# Patient Record
Sex: Male | Born: 1950
Health system: Southern US, Community
[De-identification: ages and names within clinical notes are randomized; demographics above are authoritative.]

## PROBLEM LIST (undated history)

## (undated) DIAGNOSIS — E785 Hyperlipidemia, unspecified: Secondary | ICD-10-CM

## (undated) DIAGNOSIS — R51 Headache: Secondary | ICD-10-CM

## (undated) DIAGNOSIS — I5032 Chronic diastolic (congestive) heart failure: Secondary | ICD-10-CM

## (undated) DIAGNOSIS — I739 Peripheral vascular disease, unspecified: Secondary | ICD-10-CM

## (undated) DIAGNOSIS — R0600 Dyspnea, unspecified: Secondary | ICD-10-CM

## (undated) DIAGNOSIS — J449 Chronic obstructive pulmonary disease, unspecified: Secondary | ICD-10-CM

## (undated) DIAGNOSIS — C413 Malignant neoplasm of ribs, sternum and clavicle: Secondary | ICD-10-CM

## (undated) DIAGNOSIS — Y92009 Unspecified place in unspecified non-institutional (private) residence as the place of occurrence of the external cause: Secondary | ICD-10-CM

## (undated) DIAGNOSIS — Z9289 Personal history of other medical treatment: Secondary | ICD-10-CM

## (undated) DIAGNOSIS — I509 Heart failure, unspecified: Secondary | ICD-10-CM

## (undated) DIAGNOSIS — W19XXXA Unspecified fall, initial encounter: Secondary | ICD-10-CM

## (undated) DIAGNOSIS — K219 Gastro-esophageal reflux disease without esophagitis: Secondary | ICD-10-CM

## (undated) DIAGNOSIS — I493 Ventricular premature depolarization: Secondary | ICD-10-CM

## (undated) DIAGNOSIS — F329 Major depressive disorder, single episode, unspecified: Secondary | ICD-10-CM

## (undated) DIAGNOSIS — K5903 Drug induced constipation: Secondary | ICD-10-CM

## (undated) DIAGNOSIS — Z8719 Personal history of other diseases of the digestive system: Secondary | ICD-10-CM

## (undated) DIAGNOSIS — K5900 Constipation, unspecified: Secondary | ICD-10-CM

## (undated) DIAGNOSIS — E669 Obesity, unspecified: Secondary | ICD-10-CM

## (undated) DIAGNOSIS — I2510 Atherosclerotic heart disease of native coronary artery without angina pectoris: Secondary | ICD-10-CM

## (undated) DIAGNOSIS — G8929 Other chronic pain: Secondary | ICD-10-CM

## (undated) DIAGNOSIS — M545 Low back pain, unspecified: Secondary | ICD-10-CM

## (undated) DIAGNOSIS — Z720 Tobacco use: Secondary | ICD-10-CM

## (undated) DIAGNOSIS — I219 Acute myocardial infarction, unspecified: Secondary | ICD-10-CM

## (undated) DIAGNOSIS — R519 Headache, unspecified: Secondary | ICD-10-CM

## (undated) DIAGNOSIS — F419 Anxiety disorder, unspecified: Secondary | ICD-10-CM

## (undated) DIAGNOSIS — D649 Anemia, unspecified: Secondary | ICD-10-CM

## (undated) DIAGNOSIS — G894 Chronic pain syndrome: Secondary | ICD-10-CM

## (undated) DIAGNOSIS — I10 Essential (primary) hypertension: Secondary | ICD-10-CM

## (undated) DIAGNOSIS — F32A Depression, unspecified: Secondary | ICD-10-CM

## (undated) DIAGNOSIS — I779 Disorder of arteries and arterioles, unspecified: Secondary | ICD-10-CM

## (undated) DIAGNOSIS — G709 Myoneural disorder, unspecified: Secondary | ICD-10-CM

## (undated) DIAGNOSIS — E611 Iron deficiency: Secondary | ICD-10-CM

## (undated) DIAGNOSIS — I4729 Other ventricular tachycardia: Secondary | ICD-10-CM

## (undated) DIAGNOSIS — I472 Ventricular tachycardia: Secondary | ICD-10-CM

## (undated) DIAGNOSIS — M1711 Unilateral primary osteoarthritis, right knee: Secondary | ICD-10-CM

## (undated) DIAGNOSIS — R911 Solitary pulmonary nodule: Secondary | ICD-10-CM

## (undated) DIAGNOSIS — R7303 Prediabetes: Secondary | ICD-10-CM

## (undated) DIAGNOSIS — J189 Pneumonia, unspecified organism: Secondary | ICD-10-CM

## (undated) HISTORY — DX: Hyperlipidemia, unspecified: E78.5

## (undated) HISTORY — PX: TONSILLECTOMY: SUR1361

## (undated) HISTORY — PX: HERNIA REPAIR: SHX51

## (undated) HISTORY — DX: Essential (primary) hypertension: I10

## (undated) HISTORY — DX: Unilateral primary osteoarthritis, right knee: M17.11

## (undated) HISTORY — DX: Tobacco use: Z72.0

## (undated) HISTORY — PX: BACK SURGERY: SHX140

## (undated) HISTORY — DX: Obesity, unspecified: E66.9

## (undated) HISTORY — PX: KNEE ARTHROPLASTY: SHX992

## (undated) HISTORY — PX: NASAL SINUS SURGERY: SHX719

## (undated) HISTORY — PX: PAIN PUMP IMPLANTATION: SHX330

## (undated) HISTORY — PX: FRACTURE SURGERY: SHX138

## (undated) HISTORY — DX: Malignant neoplasm of ribs, sternum and clavicle: C41.3

## (undated) HISTORY — DX: Atherosclerotic heart disease of native coronary artery without angina pectoris: I25.10

## (undated) HISTORY — PX: HIATAL HERNIA REPAIR: SHX195

## (undated) HISTORY — PX: APPENDECTOMY: SHX54

## (undated) HISTORY — PX: SHOULDER SURGERY: SHX246

## (undated) HISTORY — PX: REPLACEMENT TOTAL KNEE: SUR1224

## (undated) HISTORY — DX: Chronic pain syndrome: G89.4

## (undated) HISTORY — PX: JOINT REPLACEMENT: SHX530

## (undated) HISTORY — PX: COLONOSCOPY: SHX174

---

## 1985-08-06 HISTORY — PX: CHOLECYSTECTOMY OPEN: SUR202

## 1994-08-06 HISTORY — PX: CORONARY ANGIOPLASTY WITH STENT PLACEMENT: SHX49

## 1996-08-06 DIAGNOSIS — Z9289 Personal history of other medical treatment: Secondary | ICD-10-CM

## 1996-08-06 HISTORY — DX: Personal history of other medical treatment: Z92.89

## 1998-06-05 ENCOUNTER — Encounter: Payer: Self-pay | Admitting: Emergency Medicine

## 1998-06-05 ENCOUNTER — Emergency Department (HOSPITAL_COMMUNITY): Admission: EM | Admit: 1998-06-05 | Discharge: 1998-06-05 | Payer: Self-pay | Admitting: Emergency Medicine

## 1999-06-09 ENCOUNTER — Encounter: Admission: RE | Admit: 1999-06-09 | Discharge: 1999-06-09 | Payer: Self-pay | Admitting: Family Medicine

## 1999-06-09 ENCOUNTER — Encounter: Payer: Self-pay | Admitting: Family Medicine

## 1999-06-16 ENCOUNTER — Encounter: Payer: Self-pay | Admitting: Family Medicine

## 1999-06-16 ENCOUNTER — Encounter: Admission: RE | Admit: 1999-06-16 | Discharge: 1999-06-16 | Payer: Self-pay | Admitting: Family Medicine

## 2000-07-03 ENCOUNTER — Encounter: Admission: RE | Admit: 2000-07-03 | Discharge: 2000-07-03 | Payer: Self-pay | Admitting: Orthopedic Surgery

## 2000-07-03 ENCOUNTER — Encounter: Payer: Self-pay | Admitting: Orthopedic Surgery

## 2000-08-26 ENCOUNTER — Encounter: Payer: Self-pay | Admitting: Orthopedic Surgery

## 2000-09-02 ENCOUNTER — Observation Stay (HOSPITAL_COMMUNITY): Admission: RE | Admit: 2000-09-02 | Discharge: 2000-09-03 | Payer: Self-pay | Admitting: Orthopedic Surgery

## 2000-09-18 ENCOUNTER — Encounter: Admission: RE | Admit: 2000-09-18 | Discharge: 2000-12-17 | Payer: Self-pay | Admitting: Orthopedic Surgery

## 2000-12-18 ENCOUNTER — Encounter: Admission: RE | Admit: 2000-12-18 | Discharge: 2001-01-31 | Payer: Self-pay | Admitting: Orthopedic Surgery

## 2003-07-22 ENCOUNTER — Encounter: Admission: RE | Admit: 2003-07-22 | Discharge: 2003-07-22 | Payer: Self-pay | Admitting: Family Medicine

## 2003-07-27 ENCOUNTER — Ambulatory Visit (HOSPITAL_COMMUNITY): Admission: RE | Admit: 2003-07-27 | Discharge: 2003-07-27 | Payer: Self-pay

## 2003-11-04 ENCOUNTER — Encounter: Admission: RE | Admit: 2003-11-04 | Discharge: 2003-11-04 | Payer: Self-pay | Admitting: Family Medicine

## 2009-05-30 ENCOUNTER — Emergency Department (HOSPITAL_COMMUNITY): Admission: EM | Admit: 2009-05-30 | Discharge: 2009-05-30 | Payer: Self-pay | Admitting: Emergency Medicine

## 2009-09-01 ENCOUNTER — Observation Stay (HOSPITAL_COMMUNITY): Admission: EM | Admit: 2009-09-01 | Discharge: 2009-09-02 | Payer: Self-pay | Admitting: Emergency Medicine

## 2009-09-01 ENCOUNTER — Ambulatory Visit: Payer: Self-pay | Admitting: Internal Medicine

## 2009-09-01 HISTORY — PX: CORONARY ANGIOPLASTY WITH STENT PLACEMENT: SHX49

## 2010-03-06 DIAGNOSIS — M1711 Unilateral primary osteoarthritis, right knee: Secondary | ICD-10-CM

## 2010-03-06 HISTORY — DX: Unilateral primary osteoarthritis, right knee: M17.11

## 2010-03-21 ENCOUNTER — Telehealth (INDEPENDENT_AMBULATORY_CARE_PROVIDER_SITE_OTHER): Payer: Self-pay | Admitting: *Deleted

## 2010-03-22 ENCOUNTER — Encounter (HOSPITAL_COMMUNITY): Admission: RE | Admit: 2010-03-22 | Discharge: 2010-04-24 | Payer: Self-pay | Admitting: Cardiology

## 2010-03-22 ENCOUNTER — Ambulatory Visit: Payer: Self-pay | Admitting: Cardiovascular Disease

## 2010-03-22 ENCOUNTER — Encounter: Payer: Self-pay | Admitting: Cardiovascular Disease

## 2010-03-22 ENCOUNTER — Ambulatory Visit: Payer: Self-pay

## 2010-04-05 ENCOUNTER — Inpatient Hospital Stay (HOSPITAL_COMMUNITY): Admission: RE | Admit: 2010-04-05 | Discharge: 2010-04-12 | Payer: Self-pay | Admitting: Orthopedic Surgery

## 2010-09-05 NOTE — Progress Notes (Signed)
Summary: nuc pre procedure  Phone Note Outgoing Call Call back at Home Phone [phone]   Call placed by: CHasspacher,RN Call placed to: Patient Action Taken: Phone Call Completed Reason for Call: Confirm/change Appt Summary of Call: Reviewed information on Myoview Information Sheet (see scanned document for further details).  Spoke with pt.      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

## 2010-09-05 NOTE — Assessment & Plan Note (Signed)
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 had trouble tolerating being on the scanning table due to pain. The stress portion of the study was dc'd after Dr. Patty Sermons was consulted.  Nuclear Med Study 1 or 2 day study:  1 day     Stress Test Type:  Rest Only Reading MD:  Charlton Haws, MD     Referring MD:  Cassell Clement Resting Radionuclide:  Technetium 46m Tetrofosmin     Resting Radionuclide Dose:  11 mCi    Stress Protocol          Nuclear Technologist:  Doyne Keel  Rest Procedure  Myocardial perfusion imaging was performed at rest 45 minutes following the intravenous administration of Myoview Technetium 42m Tetrofosmin.  QPS Raw Data Images:  motion artifact Rest Images:  Normal homogeneous uptake in all areas of the myocardium.   Quantitative Gated Spect Images QGS cine images:  non-gated study  Findings Non diagnostic nuclear study  Evidence for inferior infarct     Overall Impression  Exercise Capacity: Rest Only Overall Impression Comments: Rest only study shows decrease uptake in the inferior wall.  Particularly at the inferobase.  No stress images or gating done.

## 2010-10-19 LAB — CBC
HCT: 24.9 % — ABNORMAL LOW (ref 39.0–52.0)
HCT: 26.8 % — ABNORMAL LOW (ref 39.0–52.0)
HCT: 28.6 % — ABNORMAL LOW (ref 39.0–52.0)
HCT: 32.3 % — ABNORMAL LOW (ref 39.0–52.0)
Hemoglobin: 10 g/dL — ABNORMAL LOW (ref 13.0–17.0)
Hemoglobin: 8.3 g/dL — ABNORMAL LOW (ref 13.0–17.0)
Hemoglobin: 9 g/dL — ABNORMAL LOW (ref 13.0–17.0)
MCH: 28.6 pg (ref 26.0–34.0)
MCH: 29 pg (ref 26.0–34.0)
MCH: 29 pg (ref 26.0–34.0)
MCHC: 31 g/dL (ref 30.0–36.0)
MCHC: 31.3 g/dL (ref 30.0–36.0)
MCHC: 31.5 g/dL (ref 30.0–36.0)
MCV: 92.2 fL (ref 78.0–100.0)
MCV: 92.3 fL (ref 78.0–100.0)
MCV: 92.4 fL (ref 78.0–100.0)
MCV: 92.8 fL (ref 78.0–100.0)
RBC: 2.7 MIL/uL — ABNORMAL LOW (ref 4.22–5.81)
RBC: 2.9 MIL/uL — ABNORMAL LOW (ref 4.22–5.81)
RBC: 3.1 MIL/uL — ABNORMAL LOW (ref 4.22–5.81)
RBC: 3.48 MIL/uL — ABNORMAL LOW (ref 4.22–5.81)
RDW: 13.3 % (ref 11.5–15.5)
WBC: 5.9 10*3/uL (ref 4.0–10.5)

## 2010-10-19 LAB — PROTIME-INR
INR: 2.26 — ABNORMAL HIGH (ref 0.00–1.49)
INR: 2.73 — ABNORMAL HIGH (ref 0.00–1.49)
INR: 3.28 — ABNORMAL HIGH (ref 0.00–1.49)
INR: 3.29 — ABNORMAL HIGH (ref 0.00–1.49)
Prothrombin Time: 14.4 seconds (ref 11.6–15.2)
Prothrombin Time: 29 seconds — ABNORMAL HIGH (ref 11.6–15.2)
Prothrombin Time: 30.5 seconds — ABNORMAL HIGH (ref 11.6–15.2)
Prothrombin Time: 44.7 seconds — ABNORMAL HIGH (ref 11.6–15.2)

## 2010-10-19 LAB — BASIC METABOLIC PANEL
CO2: 34 mEq/L — ABNORMAL HIGH (ref 19–32)
Calcium: 7.6 mg/dL — ABNORMAL LOW (ref 8.4–10.5)
Chloride: 95 mEq/L — ABNORMAL LOW (ref 96–112)
GFR calc Af Amer: >60 mL/min (ref >60)
Glucose, Bld: 156 mg/dL — ABNORMAL HIGH (ref 70–99)
Sodium: 138 mEq/L (ref 135–145)

## 2010-10-20 LAB — COMPREHENSIVE METABOLIC PANEL
ALT: 27 U/L (ref 0–53)
AST: 39 U/L — ABNORMAL HIGH (ref 0–37)
Albumin: 3.9 g/dL (ref 3.5–5.2)
Alkaline Phosphatase: 49 U/L (ref 39–117)
Chloride: 103 mEq/L (ref 96–112)
GFR calc Af Amer: >60 mL/min (ref >60)
Potassium: 3.8 mEq/L (ref 3.5–5.1)
Sodium: 142 mEq/L (ref 135–145)
Total Bilirubin: 0.4 mg/dL (ref 0.3–1.2)

## 2010-10-20 LAB — DIFFERENTIAL
Basophils Absolute: 0 10*3/uL (ref 0.0–0.1)
Basophils Relative: 0 % (ref 0–1)
Eosinophils Absolute: 0.2 10*3/uL (ref 0.0–0.7)
Eosinophils Relative: 3 % (ref 0–5)
Lymphocytes Relative: 36 % (ref 12–46)
Monocytes Absolute: 0.5 10*3/uL (ref 0.1–1.0)

## 2010-10-20 LAB — CBC
Hemoglobin: 12.8 g/dL — ABNORMAL LOW (ref 13.0–17.0)
Platelets: 172 10*3/uL (ref 150–400)
RBC: 4.36 MIL/uL (ref 4.22–5.81)
WBC: 6.8 10*3/uL (ref 4.0–10.5)

## 2010-10-20 LAB — TYPE AND SCREEN: DAT, IgG: NEGATIVE

## 2010-10-20 LAB — URINALYSIS, ROUTINE W REFLEX MICROSCOPIC
Hgb urine dipstick: NEGATIVE
Specific Gravity, Urine: 1.009 (ref 1.005–1.030)
Urobilinogen, UA: 1 mg/dL (ref 0.0–1.0)

## 2010-10-20 LAB — SURGICAL PCR SCREEN: Staphylococcus aureus: POSITIVE — AB

## 2010-10-22 LAB — COMPREHENSIVE METABOLIC PANEL
Alkaline Phosphatase: 46 U/L (ref 39–117)
BUN: 6 mg/dL (ref 6–23)
BUN: 7 mg/dL (ref 6–23)
Calcium: 9 mg/dL (ref 8.4–10.5)
Chloride: 97 mEq/L (ref 96–112)
GFR calc non Af Amer: >60 mL/min (ref >60)
Glucose, Bld: 89 mg/dL (ref 70–99)
Glucose, Bld: 93 mg/dL (ref 70–99)
Potassium: 3.5 mEq/L (ref 3.5–5.1)
Total Bilirubin: 0.2 mg/dL — ABNORMAL LOW (ref 0.3–1.2)
Total Protein: 6.5 g/dL (ref 6.0–8.3)
Total Protein: 6.7 g/dL (ref 6.0–8.3)

## 2010-10-22 LAB — CK TOTAL AND CKMB (NOT AT ARMC): Total CK: 78 U/L (ref 7–232)

## 2010-10-22 LAB — CBC
HCT: 40.3 % (ref 39.0–52.0)
Hemoglobin: 13.3 g/dL (ref 13.0–17.0)
MCHC: 33 g/dL (ref 30.0–36.0)
MCHC: 34.5 g/dL (ref 30.0–36.0)
MCV: 87.3 fL (ref 78.0–100.0)
MCV: 88.8 fL (ref 78.0–100.0)
Platelets: 186 10*3/uL (ref 150–400)
Platelets: 206 10*3/uL (ref 150–400)
RBC: 4.47 MIL/uL (ref 4.22–5.81)
RDW: 13.6 % (ref 11.5–15.5)
RDW: 13.6 % (ref 11.5–15.5)
WBC: 10.3 10*3/uL (ref 4.0–10.5)

## 2010-10-22 LAB — BASIC METABOLIC PANEL
BUN: 6 mg/dL (ref 6–23)
CO2: 29 mEq/L (ref 19–32)
Calcium: 9 mg/dL (ref 8.4–10.5)
Creatinine, Ser: 0.79 mg/dL (ref 0.4–1.5)
GFR calc Af Amer: >60 mL/min (ref >60)

## 2010-10-22 LAB — CARDIAC PANEL(CRET KIN+CKTOT+MB+TROPI)
CK, MB: 1.2 ng/mL (ref 0.3–4.0)
Total CK: 57 U/L (ref 7–232)
Troponin I: 0.01 ng/mL (ref 0.00–0.06)

## 2010-10-22 LAB — TROPONIN I: Troponin I: 0.02 ng/mL (ref 0.00–0.06)

## 2010-10-22 LAB — LIPID PANEL
Cholesterol: 130 mg/dL (ref 0–200)
HDL: 49 mg/dL (ref >39)
Triglycerides: 89 mg/dL (ref <150)

## 2010-10-22 LAB — POCT I-STAT, CHEM 8
Creatinine, Ser: 1.2 mg/dL (ref 0.4–1.5)
Hemoglobin: 15 g/dL (ref 13.0–17.0)
Potassium: 3.6 mEq/L (ref 3.5–5.1)
Sodium: 136 mEq/L (ref 135–145)

## 2010-10-22 LAB — DIFFERENTIAL
Eosinophils Absolute: 0.1 10*3/uL (ref 0.0–0.7)
Lymphocytes Relative: 35 % (ref 12–46)
Lymphs Abs: 3.7 10*3/uL (ref 0.7–4.0)
Neutro Abs: 6.1 10*3/uL (ref 1.7–7.7)
Neutrophils Relative %: 59 % (ref 43–77)

## 2010-10-22 LAB — LIPASE, BLOOD: Lipase: 15 U/L (ref 11–59)

## 2010-10-22 LAB — PROTIME-INR: Prothrombin Time: 13.3 seconds (ref 11.6–15.2)

## 2010-10-22 LAB — POCT CARDIAC MARKERS
CKMB, poc: <1 ng/mL — ABNORMAL LOW (ref 1.0–8.0)
Myoglobin, poc: 88.5 ng/mL (ref 12–200)

## 2010-10-22 LAB — AMYLASE: Amylase: 33 U/L (ref 0–105)

## 2010-12-22 NOTE — Op Note (Signed)
[facility]  Patient:    Jerry [name], Jerry [name]                     MRN: [id number] Proc. Date: 09/02/00 Adm. Date:  [id number] Attending:  Ollen Gross V                           Operative Report  PREOPERATIVE DIAGNOSIS:  Left shoulder impingement syndrome, acromioclavicular arthrosis, and rotator cuff tear.  POSTOPERATIVE DIAGNOSIS:  Left shoulder impingement syndrome, acromioclavicular arthrosis, and rotator cuff tear.  PROCEDURE:  Left shoulder arthroscopy with labral debridement, subacromial decompression, distal clavicle resection, and mini open rotator cuff repair.  SURGEON:  Ollen Gross, M.D.  ASSISTANT:  Alexzandrew L. Perkins, P.A.-C.  ANESTHESIA:  General.  ESTIMATED BLOOD LOSS:  200.  DRAIN:  None.  COMPLICATIONS:  None.  CONDITION:  Stable to recovery.  BRIEF CLINICAL NOTE:  The patient is a 60 year old male with severe pain in his left shoulder which has been refractory to nonoperative management.  His arthrogram showed partial thickness cuff tear.  He has failed injections and physical therapy.  He presents now for the above mentioned procedure.  DESCRIPTION OF PROCEDURE:  After successful administration of general anesthetic, the patient was placed in the upright and a beach chair position, and his left upper extremity and shoulder girdle isolated with a _______ plastic drape, and was prepped and draped in the usual sterile fashion. Arthroscopic landmarks are marked and an incision made through the posterior portal.  A cannula and camera passed into the joint and was confirmed to be intra-articular and inflow was initiated.  Hence, a fair amount of synovitis in the joint itself.  He had fraying of the anterior-superior labrum as well as the superior-posterior labrum.  This was subsequently addressed through an anterior portal which had been isolated with a spinal needle and a small incision made to create the portal and the  cannula passed.  The shaver was used to debride labral fraying back to a stable base.  It appears normal and the probe was normal.  The biceps tendon was fine.  The humeral head and the glenoid surfaces looked fine.  The supraspinatus has a high grade under surface tear about 1.5 cm in size.  We cannot see any areas of full thickness, but the partial thickness is very high grade.  The remainder of the under surface of the cuff looks fine.  The glenohumeral joint is exited and the subacromial space entered.  He had just a tremendous amount of bursal tissue present in the subacromial space. Soft tissue decompression was performed through the anterior and lateral portals using a shaver and Linvatec ablator.  Once the scope had adequate soft tissue, decompression was performed and the bur was passed through the lateral portal and the bony subacromial decompression performed to remove a large anterolateral spur and create a flat under surface.  Through the anterior portal, the distal clavicle was excised, but visualization was suboptimal, and it was felt that we needed to remove some more through a small open incision. You could not see any tears on the bursal surface, but given the high grade partial tear on the joint surface, it was decided to go ahead and do a mini open incision by extending the lateral portal incision.  The lateral portal incision was extended for a total length of about 3 cm. The skin was cut with a 15 blade through subcutaneous  tissue to the deltoid fascia which was split with the scissors.  The deltoid fibers were split down to the proximal humerus.  There was still a significant amount of bursal tissue left which was removed.  The cuff was palpated and there is a dimple in the area of the supraspinatus insertion consistent with the area that we saw on the scope.  This was incised and there is a lot of globular debris present in the tendon at this level which was  excised back to normal-appearing tendon. A small trough was made and Mitek anchor is packed into the trough.  The remainder of the suture then passed through the free edge of the tendon which is advanced into the trough area.  This was over tied and oversewn and palpated and feels to be fine.  At this point, the humeral head was ________, the under surface of the acromion palpated and it feels flat, but there is still a decent size spur present in the distal clavicle.  A small incision was made along the skin line just over the distal clavicle.  The skin was cut, the subcutaneous tissue dissected, and periosteum split.  A rongeur was used to remove this one further spur and at this point, the distal clavicle decompression was felt to be sufficient.  The wounds were then copiously irrigated with antibiotic solution and the periosteum of the distal clavicle was imbricated with #1 Ethibond.  The deltoid split is closed with 2-0 Vicryl, subcutaneous closed with 2-0 Vicryl, subcuticular closed with running 4-0 Monocryl.  The anterior and posterior portal incisions were closed with interrupted 4-0 nylon.  A bulky sterile dressing was applied and placed into a sling, awakened, and transported to the recovery room in stable condition. DD:  09/02/00 TD:  09/02/00 Job: [id number]

## 2011-02-01 ENCOUNTER — Telehealth: Payer: Self-pay | Admitting: Cardiology

## 2011-02-01 NOTE — Telephone Encounter (Signed)
PT HAS NOT BEEN HERE IN 2 YEARS. HAS BEEN HURTING AND TAKING NITRO FROM TIME TO TIME AND IT HELPS. HE'S CONCERNED AND THINKS ITS TIME TO COME IN FOR A CHECKUP. CHART IN BOX.

## 2011-02-01 NOTE — Telephone Encounter (Signed)
Scheduled appointment for patient.  Stated sometime in July would be ok. Stated he felt fine and saw PCP a within the last couple of weeks and everything checked out good.  He was just thinking he needed to be seen since he has not been evaluated and has had to use NTG a few times lately.  Advised if he had any problems between now and his appointment to call.

## 2011-02-09 ENCOUNTER — Encounter: Payer: Self-pay | Admitting: *Deleted

## 2011-02-09 DIAGNOSIS — Z8659 Personal history of other mental and behavioral disorders: Secondary | ICD-10-CM | POA: Insufficient documentation

## 2011-02-09 DIAGNOSIS — Z9049 Acquired absence of other specified parts of digestive tract: Secondary | ICD-10-CM | POA: Insufficient documentation

## 2011-02-09 DIAGNOSIS — M1711 Unilateral primary osteoarthritis, right knee: Secondary | ICD-10-CM | POA: Insufficient documentation

## 2011-02-09 DIAGNOSIS — G894 Chronic pain syndrome: Secondary | ICD-10-CM | POA: Insufficient documentation

## 2011-02-12 ENCOUNTER — Ambulatory Visit (INDEPENDENT_AMBULATORY_CARE_PROVIDER_SITE_OTHER): Payer: No Typology Code available for payment source | Admitting: Nurse Practitioner

## 2011-02-12 ENCOUNTER — Encounter: Payer: Self-pay | Admitting: Nurse Practitioner

## 2011-02-12 VITALS — BP 122/80 | HR 76 | Ht 74.0 in | Wt 290.8 lb

## 2011-02-12 DIAGNOSIS — R0609 Other forms of dyspnea: Secondary | ICD-10-CM

## 2011-02-12 DIAGNOSIS — I2510 Atherosclerotic heart disease of native coronary artery without angina pectoris: Secondary | ICD-10-CM

## 2011-02-12 DIAGNOSIS — R0989 Other specified symptoms and signs involving the circulatory and respiratory systems: Secondary | ICD-10-CM

## 2011-02-12 DIAGNOSIS — R609 Edema, unspecified: Secondary | ICD-10-CM

## 2011-02-12 DIAGNOSIS — R0600 Dyspnea, unspecified: Secondary | ICD-10-CM

## 2011-02-12 LAB — CBC WITH DIFFERENTIAL/PLATELET
Basophils Absolute: 0 10*3/uL (ref 0.0–0.1)
Basophils Relative: 0.3 % (ref 0.0–3.0)
Eosinophils Absolute: 0.1 10*3/uL (ref 0.0–0.7)
Eosinophils Relative: 1.4 % (ref 0.0–5.0)
HCT: 41.3 % (ref 39.0–52.0)
Hemoglobin: 13.6 g/dL (ref 13.0–17.0)
Lymphocytes Relative: 33.8 % (ref 12.0–46.0)
Lymphs Abs: 3.5 10*3/uL (ref 0.7–4.0)
MCHC: 32.9 g/dL (ref 30.0–36.0)
MCV: 87.1 fl (ref 78.0–100.0)
Monocytes Absolute: 0.5 10*3/uL (ref 0.1–1.0)
Monocytes Relative: 4.6 % (ref 3.0–12.0)
Neutro Abs: 6.1 10*3/uL (ref 1.4–7.7)
Neutrophils Relative %: 59.9 % (ref 43.0–77.0)
Platelets: 220 10*3/uL (ref 150.0–400.0)
RBC: 4.75 Mil/uL (ref 4.22–5.81)
RDW: 16.3 % — ABNORMAL HIGH (ref 11.5–14.6)
WBC: 10.2 10*3/uL (ref 4.5–10.5)

## 2011-02-12 LAB — BASIC METABOLIC PANEL
BUN: 10 mg/dL (ref 6–23)
CO2: 37 mEq/L — ABNORMAL HIGH (ref 19–32)
Calcium: 8.9 mg/dL (ref 8.4–10.5)
Chloride: 96 mEq/L (ref 96–112)
Creatinine, Ser: 1 mg/dL (ref 0.4–1.5)
GFR: 83.03 mL/min (ref >60.00)
Glucose, Bld: 105 mg/dL — ABNORMAL HIGH (ref 70–99)
Potassium: 3.5 mEq/L (ref 3.5–5.1)
Sodium: 141 mEq/L (ref 135–145)

## 2011-02-12 LAB — BRAIN NATRIURETIC PEPTIDE: Pro B Natriuretic peptide (BNP): 10 pg/mL (ref 0.0–100.0)

## 2011-02-12 MED ORDER — FUROSEMIDE 20 MG PO TABS: 20.0000 mg | ORAL_TABLET | Freq: Three times a day (TID) | ORAL | Status: DC

## 2011-02-12 NOTE — Progress Notes (Signed)
Jerry [name] Date of Birth: 1950-12-20   History of Present Illness: Jerry [name] is seen back today for a follow up visit. He is seen for Dr. Patty Sermons. He has not been here since October of 2010. He had a heart cath by Dr. [place] back in January of 2011. He continues with medical management. He seems to be at his baseline. He says his pains are unchanged. He is tolerating his medicines. He is smoking still. He has had more swelling lately and has taken some extra diuretic. He says his regular dose just does not seem to be working. He has also been treated for cellulitis recently with antibiotics.   Current Outpatient Prescriptions on File Prior to Visit  Medication Sig Dispense Refill  . aspirin 81 MG tablet Take 81 mg by mouth daily.        . Calcium Carbonate Antacid (TUMS PO) Take by mouth. As needed       . calcium citrate-vitamin D (CITRACAL+D) 315-200 MG-UNIT per tablet Take 1 tablet by mouth daily.        . diazepam (VALIUM) 10 MG tablet Take 10 mg by mouth 2 (two) times daily.        Marland Kitchen docusate sodium (COLACE) 100 MG capsule Take 100 mg by mouth 2 (two) times daily.        Marland Kitchen doxazosin (CARDURA) 1 MG tablet Take 1 mg by mouth at bedtime.        . fenofibrate 160 MG tablet Take 160 mg by mouth daily.        . fish oil-omega-3 fatty acids 1000 MG capsule Take 2 g by mouth daily.        Marland Kitchen lovastatin (MEVACOR) 20 MG tablet Take 20 mg by mouth at bedtime.        . Multiple Vitamins-Minerals (CENTRUM SILVER PO) Take by mouth.        . nitroGLYCERIN (NITROSTAT) 0.4 MG SL tablet Place 0.4 mg under the tongue every 5 (five) minutes as needed.        Marland Kitchen oxycodone (OXYCONTIN) 30 MG TB12 Take 30 mg by mouth as needed.        . pregabalin (LYRICA) 75 MG capsule Take 75 mg by mouth daily.        . promethazine (PHENERGAN) 25 MG tablet Take 25 mg by mouth every 6 (six) hours as needed.        . traZODone (DESYREL) 150 MG tablet Take 150 mg by mouth at bedtime. Taking 2 at bedtime       . vitamin  B-12 (CYANOCOBALAMIN) 500 MCG tablet Take 500 mcg by mouth daily.        Marland Kitchen DISCONTD: furosemide (LASIX) 20 MG tablet Take 20 mg by mouth 2 (two) times daily.         Allergies  Allergen Reactions  . Ciprofloxacin   . Penicillins   . Percocet (Oxycodone-Acetaminophen)     hives  . Tramadol Hcl     Past Medical History  Diagnosis Date  . Hypertension   . Coronary artery disease   . Chronic pain syndrome   . Degenerative joint disease of knee, right aug. 2011    arthroplasty Dr. Jodi Geralds  . Hyperlipidemia   . History of depression   . History of cholecystectomy 1987  . Tobacco abuse   . Obesity     Past Surgical History  Procedure Date  . Cardiac catheterization 09/01/2009    Had patent stent to the obtuse  marginal 1  . Coronary angioplasty 1996  . Back surgery     multiple  . Right shoulder surgery   . Appendectomy     History  Smoking status  . Current Everyday Smoker -- 1.0 packs/day  . Types: Cigarettes  Smokeless tobacco  . Not on file    History  Alcohol Use No    History reviewed. No pertinent family history.  Review of Systems: The review of systems is positive for chronic pain syndrome.  All other systems were reviewed and are negative.  Physical Exam: BP 122/80  Pulse 76  Ht 6\' 2"  (1.88 m)  Wt 290 lb 12.8 oz (131.906 kg)  BMI 37.34 kg/m2 Patient is pleasant and in no acute distress. He smells of tobacco. He is very obese. Skin is warm and dry. Color is normal.  HEENT is unremarkable. Normocephalic/atraumatic. PERRL. Sclera are nonicteric. Neck is supple. No masses. No JVD. Lungs are clear. Cardiac exam shows a regular rate and rhythm. Abdomen is obese and soft. Extremities are with trace dema. Gait and ROM are intact. No gross neurologic deficits noted.  LABORATORY DATA: Pending   Assessment / Plan:

## 2011-02-12 NOTE — Assessment & Plan Note (Signed)
He had his Lasix increased while being treated for cellulitis with a good response. I have put him on a total of 60 mg per day. Labs are checked today.

## 2011-02-12 NOTE — Assessment & Plan Note (Signed)
He seems to be at his baseline. I have left him on his current regimen. Labs are checked today.

## 2011-02-12 NOTE — Patient Instructions (Addendum)
Stay on your current medicines except we will increase your fluid pill We will see you back in about 4 months I encourage you to stop smoking We are going to check some labs today You may increase your fluid pills to 2 in the am and 1 in the afternoon

## 2011-02-13 ENCOUNTER — Telehealth: Payer: Self-pay | Admitting: *Deleted

## 2011-02-13 NOTE — Telephone Encounter (Signed)
Message copied by Burnell Blanks on Tue Feb 13, 2011  9:40 AM ------      Message from: Cassell Clement      Created: Mon Feb 12, 2011  5:28 PM       Please report.  Labs are satisfactory.  The potassium is borderline low so he should eat extra bananas and high potassium fruit.  Continue present medication

## 2011-02-13 NOTE — Telephone Encounter (Signed)
Advised of labs 

## 2011-02-13 NOTE — Progress Notes (Signed)
Advised of labs 

## 2011-08-06 ENCOUNTER — Other Ambulatory Visit: Payer: Self-pay | Admitting: Nurse Practitioner

## 2011-11-22 ENCOUNTER — Encounter (HOSPITAL_COMMUNITY): Payer: Self-pay | Admitting: Critical Care Medicine

## 2011-11-22 ENCOUNTER — Encounter (HOSPITAL_COMMUNITY): Payer: Self-pay | Admitting: Orthopedic Surgery

## 2011-11-22 ENCOUNTER — Ambulatory Visit (HOSPITAL_COMMUNITY)
Admission: AD | Admit: 2011-11-22 | Discharge: 2011-11-22 | Disposition: A | Discharge status: Home / Self Care | Payer: No Typology Code available for payment source | Source: Ambulatory Visit | Attending: Orthopedic Surgery | Admitting: Orthopedic Surgery

## 2011-11-22 ENCOUNTER — Other Ambulatory Visit: Payer: Self-pay | Admitting: Orthopedic Surgery

## 2011-11-22 ENCOUNTER — Encounter (HOSPITAL_COMMUNITY)
Admission: AD | Disposition: A | Discharge status: Home / Self Care | Payer: Self-pay | Source: Ambulatory Visit | Attending: Orthopedic Surgery

## 2011-11-22 ENCOUNTER — Inpatient Hospital Stay (HOSPITAL_COMMUNITY): Payer: No Typology Code available for payment source

## 2011-11-22 ENCOUNTER — Inpatient Hospital Stay (HOSPITAL_COMMUNITY): Payer: No Typology Code available for payment source | Admitting: Critical Care Medicine

## 2011-11-22 DIAGNOSIS — F17200 Nicotine dependence, unspecified, uncomplicated: Secondary | ICD-10-CM | POA: Insufficient documentation

## 2011-11-22 DIAGNOSIS — J449 Chronic obstructive pulmonary disease, unspecified: Secondary | ICD-10-CM | POA: Insufficient documentation

## 2011-11-22 DIAGNOSIS — J4489 Other specified chronic obstructive pulmonary disease: Secondary | ICD-10-CM | POA: Insufficient documentation

## 2011-11-22 DIAGNOSIS — I10 Essential (primary) hypertension: Secondary | ICD-10-CM | POA: Insufficient documentation

## 2011-11-22 DIAGNOSIS — W540XXA Bitten by dog, initial encounter: Secondary | ICD-10-CM | POA: Insufficient documentation

## 2011-11-22 DIAGNOSIS — S51809A Unspecified open wound of unspecified forearm, initial encounter: Secondary | ICD-10-CM | POA: Insufficient documentation

## 2011-11-22 DIAGNOSIS — Y998 Other external cause status: Secondary | ICD-10-CM | POA: Insufficient documentation

## 2011-11-22 DIAGNOSIS — Y92009 Unspecified place in unspecified non-institutional (private) residence as the place of occurrence of the external cause: Secondary | ICD-10-CM | POA: Insufficient documentation

## 2011-11-22 DIAGNOSIS — I2510 Atherosclerotic heart disease of native coronary artery without angina pectoris: Secondary | ICD-10-CM | POA: Insufficient documentation

## 2011-11-22 DIAGNOSIS — K08109 Complete loss of teeth, unspecified cause, unspecified class: Secondary | ICD-10-CM | POA: Insufficient documentation

## 2011-11-22 HISTORY — PX: I & D EXTREMITY: SHX5045

## 2011-11-22 LAB — SURGICAL PCR SCREEN: Staphylococcus aureus: NEGATIVE

## 2011-11-22 LAB — GRAM STAIN

## 2011-11-22 LAB — COMPREHENSIVE METABOLIC PANEL
ALT: 17 U/L (ref 0–53)
Calcium: 9.4 mg/dL (ref 8.4–10.5)
GFR calc Af Amer: >90 mL/min (ref >90)
Glucose, Bld: 100 mg/dL — ABNORMAL HIGH (ref 70–99)
Sodium: 139 mEq/L (ref 135–145)
Total Protein: 7.5 g/dL (ref 6.0–8.3)

## 2011-11-22 SURGERY — IRRIGATION AND DEBRIDEMENT EXTREMITY
Anesthesia: General | Site: Arm Lower | Laterality: Left | Wound class: Dirty or Infected

## 2011-11-22 MED ORDER — SULFAMETHOXAZOLE-TRIMETHOPRIM 800-160 MG PO TABS: 1.0000 | ORAL_TABLET | Freq: Two times a day (BID) | ORAL | Status: AC

## 2011-11-22 MED ORDER — VANCOMYCIN HCL 1000 MG IV SOLR
1000.0000 mg | INTRAVENOUS | Status: DC | PRN
  Administered 2011-11-22: 1000 mg via INTRAVENOUS

## 2011-11-22 MED ORDER — PROPOFOL 10 MG/ML IV EMUL
INTRAVENOUS | Status: DC | PRN
  Administered 2011-11-22: 200 mg via INTRAVENOUS

## 2011-11-22 MED ORDER — BUPIVACAINE HCL (PF) 0.25 % IJ SOLN
INTRAMUSCULAR | Status: DC | PRN
  Administered 2011-11-22: 10 mL

## 2011-11-22 MED ORDER — NEOSTIGMINE METHYLSULFATE 1 MG/ML IJ SOLN
INTRAMUSCULAR | Status: DC | PRN
  Administered 2011-11-22: 2 mg via INTRAVENOUS

## 2011-11-22 MED ORDER — CHLORHEXIDINE GLUCONATE 4 % EX LIQD: 60.0000 mL | Freq: Once | CUTANEOUS | Status: DC

## 2011-11-22 MED ORDER — ONDANSETRON HCL 4 MG/2ML IJ SOLN
INTRAMUSCULAR | Status: DC | PRN
  Administered 2011-11-22: 4 mg via INTRAVENOUS

## 2011-11-22 MED ORDER — LACTATED RINGERS IV SOLN
INTRAVENOUS | Status: DC | PRN
  Administered 2011-11-22: 19:00:00 via INTRAVENOUS

## 2011-11-22 MED ORDER — SUCCINYLCHOLINE CHLORIDE 20 MG/ML IJ SOLN
INTRAMUSCULAR | Status: DC | PRN
  Administered 2011-11-22: 200 mg via INTRAVENOUS

## 2011-11-22 MED ORDER — GLYCOPYRROLATE 0.2 MG/ML IJ SOLN
INTRAMUSCULAR | Status: DC | PRN
  Administered 2011-11-22: .3 mg via INTRAVENOUS

## 2011-11-22 MED ORDER — CHLORHEXIDINE GLUCONATE 4 % EX LIQD
60.0000 mL | Freq: Once | CUTANEOUS | Status: AC
  Administered 2011-11-22: 4 via TOPICAL

## 2011-11-22 MED ORDER — MUPIROCIN 2 % EX OINT
TOPICAL_OINTMENT | CUTANEOUS | Status: AC
  Administered 2011-11-22: 1 via NASAL
  Filled 2011-11-22: qty 22

## 2011-11-22 MED ORDER — SODIUM CHLORIDE 0.9 % IR SOLN
Status: DC | PRN
  Administered 2011-11-22: 3000 mL

## 2011-11-22 MED ORDER — MUPIROCIN 2 % EX OINT
TOPICAL_OINTMENT | Freq: Two times a day (BID) | CUTANEOUS | Status: DC
  Administered 2011-11-22: 1 via NASAL
  Filled 2011-11-22: qty 22

## 2011-11-22 MED ORDER — VANCOMYCIN HCL IN DEXTROSE 1-5 GM/200ML-% IV SOLN
INTRAVENOUS | Status: AC
  Filled 2011-11-22: qty 200

## 2011-11-22 MED ORDER — ROCURONIUM BROMIDE 100 MG/10ML IV SOLN
INTRAVENOUS | Status: DC | PRN
  Administered 2011-11-22: 10 mg via INTRAVENOUS

## 2011-11-22 MED ORDER — HYDROMORPHONE HCL PF 1 MG/ML IJ SOLN: 0.2500 mg | INTRAMUSCULAR | Status: DC | PRN

## 2011-11-22 MED ORDER — ALBUTEROL SULFATE HFA 108 (90 BASE) MCG/ACT IN AERS
INHALATION_SPRAY | RESPIRATORY_TRACT | Status: DC | PRN
  Administered 2011-11-22 (×3): 2 via RESPIRATORY_TRACT

## 2011-11-22 MED ORDER — FENTANYL CITRATE 0.05 MG/ML IJ SOLN
INTRAMUSCULAR | Status: DC | PRN
  Administered 2011-11-22: 100 ug via INTRAVENOUS
  Administered 2011-11-22 (×3): 50 ug via INTRAVENOUS

## 2011-11-22 MED ORDER — MIDAZOLAM HCL 5 MG/5ML IJ SOLN
INTRAMUSCULAR | Status: DC | PRN
  Administered 2011-11-22 (×2): 1 mg via INTRAVENOUS

## 2011-11-22 MED ORDER — OXYCODONE-ACETAMINOPHEN 5-325 MG PO TABS: ORAL_TABLET | ORAL | Status: AC

## 2011-11-22 SURGICAL SUPPLY — 55 items
BANDAGE COBAN STERILE 2 (GAUZE/BANDAGES/DRESSINGS) IMPLANT
BANDAGE CONFORM 2  STR LF (GAUZE/BANDAGES/DRESSINGS) IMPLANT
BANDAGE ELASTIC 3 VELCRO ST LF (GAUZE/BANDAGES/DRESSINGS) ×2 IMPLANT
BANDAGE ELASTIC 4 VELCRO ST LF (GAUZE/BANDAGES/DRESSINGS) ×2 IMPLANT
BANDAGE GAUZE ELAST BULKY 4 IN (GAUZE/BANDAGES/DRESSINGS) ×2 IMPLANT
BNDG CMPR 9X4 STRL LF SNTH (GAUZE/BANDAGES/DRESSINGS)
BNDG COHESIVE 1X5 TAN STRL LF (GAUZE/BANDAGES/DRESSINGS) IMPLANT
BNDG ESMARK 4X9 LF (GAUZE/BANDAGES/DRESSINGS) IMPLANT
CLOTH BEACON ORANGE TIMEOUT ST (SAFETY) ×2 IMPLANT
CORDS BIPOLAR (ELECTRODE) ×2 IMPLANT
COVER SURGICAL LIGHT HANDLE (MISCELLANEOUS) ×2 IMPLANT
DECANTER SPIKE VIAL GLASS SM (MISCELLANEOUS) ×2 IMPLANT
DRAIN PENROSE 1/4X12 LTX STRL (WOUND CARE) IMPLANT
DRSG ADAPTIC 3X8 NADH LF (GAUZE/BANDAGES/DRESSINGS) IMPLANT
DRSG EMULSION OIL 3X3 NADH (GAUZE/BANDAGES/DRESSINGS) ×2 IMPLANT
DRSG PAD ABDOMINAL 8X10 ST (GAUZE/BANDAGES/DRESSINGS) ×4 IMPLANT
GAUZE PACKING IODOFORM 1/4X5 (PACKING) ×1 IMPLANT
GAUZE XEROFORM 1X8 LF (GAUZE/BANDAGES/DRESSINGS) ×2 IMPLANT
GLOVE BIO SURGEON STRL SZ 6.5 (GLOVE) ×1 IMPLANT
GLOVE BIO SURGEON STRL SZ7.5 (GLOVE) ×2 IMPLANT
GLOVE BIOGEL PI IND STRL 6.5 (GLOVE) IMPLANT
GLOVE BIOGEL PI IND STRL 8 (GLOVE) ×1 IMPLANT
GLOVE BIOGEL PI INDICATOR 6.5 (GLOVE) ×1
GLOVE BIOGEL PI INDICATOR 8 (GLOVE) ×1
GOWN STRL REIN XL XLG (GOWN DISPOSABLE) ×2 IMPLANT
HANDPIECE INTERPULSE COAX TIP (DISPOSABLE)
KIT BASIN OR (CUSTOM PROCEDURE TRAY) ×2 IMPLANT
KIT ROOM TURNOVER OR (KITS) ×2 IMPLANT
LOOP VESSEL MAXI BLUE (MISCELLANEOUS) IMPLANT
LOOP VESSEL MINI RED (MISCELLANEOUS) IMPLANT
MANIFOLD NEPTUNE II (INSTRUMENTS) ×2 IMPLANT
NDL HYPO 25X1 1.5 SAFETY (NEEDLE) IMPLANT
NEEDLE HYPO 25X1 1.5 SAFETY (NEEDLE) ×2 IMPLANT
NS IRRIG 1000ML POUR BTL (IV SOLUTION) ×2 IMPLANT
PACK ORTHO EXTREMITY (CUSTOM PROCEDURE TRAY) ×2 IMPLANT
PAD ARMBOARD 7.5X6 YLW CONV (MISCELLANEOUS) ×4 IMPLANT
SCRUB BETADINE 4OZ XXX (MISCELLANEOUS) ×2 IMPLANT
SET HNDPC FAN SPRY TIP SCT (DISPOSABLE) IMPLANT
SOLUTION BETADINE 4OZ (MISCELLANEOUS) ×2 IMPLANT
SPONGE GAUZE 4X4 12PLY (GAUZE/BANDAGES/DRESSINGS) ×2 IMPLANT
SPONGE LAP 18X18 X RAY DECT (DISPOSABLE) ×1 IMPLANT
SPONGE LAP 4X18 X RAY DECT (DISPOSABLE) ×2 IMPLANT
SUCTION FRAZIER TIP 10 FR DISP (SUCTIONS) ×2 IMPLANT
SUT ETHILON 3 0 PS 1 (SUTURE) ×1 IMPLANT
SUT ETHILON 4 0 PS 2 18 (SUTURE) ×2 IMPLANT
SUT MON AB 5-0 P3 18 (SUTURE) IMPLANT
SYR CONTROL 10ML LL (SYRINGE) IMPLANT
TOWEL OR 17X24 6PK STRL BLUE (TOWEL DISPOSABLE) ×2 IMPLANT
TOWEL OR 17X26 10 PK STRL BLUE (TOWEL DISPOSABLE) ×2 IMPLANT
TUBE ANAEROBIC SPECIMEN COL (MISCELLANEOUS) IMPLANT
TUBE CONNECTING 12X1/4 (SUCTIONS) ×2 IMPLANT
TUBE FEEDING 5FR 15 INCH (TUBING) IMPLANT
UNDERPAD 30X30 INCONTINENT (UNDERPADS AND DIAPERS) ×2 IMPLANT
WATER STERILE IRR 1000ML POUR (IV SOLUTION) ×2 IMPLANT
YANKAUER SUCT BULB TIP NO VENT (SUCTIONS) ×2 IMPLANT

## 2011-11-22 NOTE — Preoperative (Signed)
Beta Blockers   Reason not to administer Beta Blockers:Not Applicable 

## 2011-11-22 NOTE — Anesthesia Postprocedure Evaluation (Signed)
  Anesthesia Post-op Note  Patient: Jerry [name]  Procedure(s) Performed: Procedure(s) (LRB): IRRIGATION AND DEBRIDEMENT EXTREMITY (Left)  Patient Location: PACU  Anesthesia Type: General  Level of Consciousness: awake, alert  and oriented  Airway and Oxygen Therapy: Patient Spontanous Breathing  Post-op Pain: none  Post-op Assessment: Post-op Vital signs reviewed, Patient's Cardiovascular Status Stable, Respiratory Function Stable, Patent Airway, No signs of Nausea or vomiting and Pain level controlled  Post-op Vital Signs: Reviewed and stable  Complications: No apparent anesthesia complications

## 2011-11-22 NOTE — Op Note (Signed)
Dictation [phone]

## 2011-11-22 NOTE — Brief Op Note (Signed)
11/22/2011  9:41 PM  PATIENT:  Jerry [name]  61 y.o. male  PRE-OPERATIVE DIAGNOSIS:  dog bite  POST-OPERATIVE DIAGNOSIS:  Infected left arm abcess dog bite  PROCEDURE:  Procedure(s) (LRB): IRRIGATION AND DEBRIDEMENT EXTREMITY (Left)  SURGEON:  Surgeon(s) and Role:    * Tami Ribas, MD - Primary  PHYSICIAN ASSISTANT:   ASSISTANTS: none   ANESTHESIA:   general  EBL:  Total I/O In: 500 [I.V.:500] Out: -   BLOOD ADMINISTERED:none  DRAINS: iodoform packing  LOCAL MEDICATIONS USED:  MARCAINE     SPECIMEN:  Source of Specimen:  left forearm dog bite  DISPOSITION OF SPECIMEN:  micro  COUNTS:  YES  TOURNIQUET:   Total Tourniquet Time Documented: Upper Arm (Left) - 30 minutes  DICTATION: .Other Dictation: Dictation Number [id number]  PLAN OF CARE: Discharge to home after PACU  PATIENT DISPOSITION:  PACU - hemodynamically stable.

## 2011-11-22 NOTE — H&P (Signed)
Jerry [name] is an 61 y.o. male.   Chief Complaint: dog bite left forearm HPI: 89 yo rhd male bitten by his own dog 2 days ago breaking up a dog fight.  Did not seek medical attention until today.  No fevers, chills, night sweats.  Has noticed pus coming from one wound volarly.  No previous injury to left forearm.  Past Medical History  Diagnosis Date  . Hypertension   . Coronary artery disease   . Chronic pain syndrome   . Degenerative joint disease of knee, right aug. 2011    arthroplasty Dr. Jodi Geralds  . Hyperlipidemia   . History of depression   . History of cholecystectomy 1987  . Tobacco abuse   . Obesity     Past Surgical History  Procedure Date  . Cardiac catheterization 09/01/2009    Had patent stent to the obtuse marginal 1  . Coronary angioplasty 1996  . Back surgery     multiple  . Right shoulder surgery   . Appendectomy     No family history on file. Social History:  reports that he has been smoking Cigarettes.  He has been smoking about 1 pack per day. He does not have any smokeless tobacco history on file. He reports that he does not drink alcohol or use illicit drugs.  Allergies:  Allergies  Allergen Reactions  . Ciprofloxacin   . Penicillins   . Percocet (Oxycodone-Acetaminophen)     hives  . Tramadol Hcl     Medications Prior to Admission  Medication Dose Route Frequency Provider Last Rate Last Dose  . chlorhexidine (HIBICLENS) 4 % liquid 4 application  60 mL Topical Once Tami Ribas, MD       Medications Prior to Admission  Medication Sig Dispense Refill  . aspirin 81 MG tablet Take 81 mg by mouth daily.        . Calcium Carbonate Antacid (TUMS PO) Take by mouth. As needed       . calcium citrate-vitamin D (CITRACAL+D) 315-200 MG-UNIT per tablet Take 1 tablet by mouth daily.        . diazepam (VALIUM) 10 MG tablet Take 10 mg by mouth 2 (two) times daily.        Marland Kitchen docusate sodium (COLACE) 100 MG capsule Take 100 mg by mouth 2 (two) times  daily.        Marland Kitchen doxazosin (CARDURA) 1 MG tablet Take 1 mg by mouth at bedtime.        . fenofibrate 160 MG tablet Take 160 mg by mouth daily.        . fish oil-omega-3 fatty acids 1000 MG capsule Take 2 g by mouth daily.        . furosemide (LASIX) 20 MG tablet TAKE ONE TABLET BY MOUTH THREE TIMES DAILY  90 tablet  2  . lovastatin (MEVACOR) 20 MG tablet Take 20 mg by mouth at bedtime.        . Multiple Vitamins-Minerals (CENTRUM SILVER PO) Take by mouth.        . nitroGLYCERIN (NITROSTAT) 0.4 MG SL tablet Place 0.4 mg under the tongue every 5 (five) minutes as needed.        Marland Kitchen oxycodone (OXYCONTIN) 30 MG TB12 Take 30 mg by mouth as needed.        . pregabalin (LYRICA) 75 MG capsule Take 75 mg by mouth daily.        . promethazine (PHENERGAN) 25 MG tablet Take 25 mg  by mouth every 6 (six) hours as needed.        . traZODone (DESYREL) 150 MG tablet Take 150 mg by mouth at bedtime. Taking 2 at bedtime       . vitamin B-12 (CYANOCOBALAMIN) 500 MCG tablet Take 500 mcg by mouth daily.          No results found for this or any previous visit (from the past 48 hour(s)).  No results found.   A comprehensive review of systems was negative except for: Hematologic/lymphatic: positive for easy bruising Behavioral/Psych: positive for depression  There were no vitals taken for this visit.  General appearance: alert, cooperative and appears stated age Head: Normocephalic, without obvious abnormality, atraumatic Neck: supple, symmetrical, trachea midline Resp: clear to auscultation bilaterally Cardio: regular rate and rhythm GI: soft, non-tender; bowel sounds normal; no masses,  no organomegaly Extremities: light touch sensation and capillary refill intact all digits.  wounds both volar and dorsal forearm.  small amount of purulence coming from volar wound.  erythema both volarly and dorsally.  no proximal streaking.  ttp at wounds, but not otherwise. Pulses: 2+ and symmetric Skin: as  above Neurologic: Grossly normal Incision/Wound: As above  Assessment/Plan Left forearm dog bite with infection.  Recommend OR for I&D of wounds.  Risks, benefits, and alternatives of surgery were discussed and the patient agrees with the plan of care.   Deryk Bozman R 11/22/2011, 5:04 PM

## 2011-11-22 NOTE — Anesthesia Preprocedure Evaluation (Signed)
Anesthesia Evaluation  Patient identified by MRN, date of birth, ID band Patient awake    Reviewed: Allergy & Precautions, H&P , NPO status , Patient's Chart, lab work & pertinent test results  History of Anesthesia Complications Negative for: history of anesthetic complications  Airway Mallampati: I TM Distance: >3 FB Neck ROM: Full    Dental  (+) Edentulous Upper and Edentulous Lower   Pulmonary COPDRecent URI , Resolved, Current Smoker,    Pulmonary exam normal + wheezing (improved with inhaler)      Cardiovascular hypertension, Pt. on medications + CAD ('11 cath: non-obstructive ASCADz, stent patent) Rhythm:Regular Rate:Normal     Neuro/Psych negative neurological ROS  negative psych ROS   GI/Hepatic negative GI ROS, Neg liver ROS,   Endo/Other  Morbid obesity  Renal/GU negative Renal ROS     Musculoskeletal   Abdominal (+) + obese,   Peds  Hematology negative hematology ROS (+)   Anesthesia Other Findings   Reproductive/Obstetrics                           Anesthesia Physical Anesthesia Plan  ASA: III and Emergent  Anesthesia Plan: General   Post-op Pain Management:    Induction: Intravenous  Airway Management Planned: Oral ETT  Additional Equipment:   Intra-op Plan:   Post-operative Plan: Extubation in OR  Informed Consent: I have reviewed the patients History and Physical, chart, labs and discussed the procedure including the risks, benefits and alternatives for the proposed anesthesia with the patient or authorized representative who has indicated his/her understanding and acceptance.     Plan Discussed with: Surgeon and CRNA  Anesthesia Plan Comments: (Plan routine monitors, GETA with RSI)        Anesthesia Quick Evaluation

## 2011-11-22 NOTE — Discharge Instructions (Signed)

## 2011-11-22 NOTE — Transfer of Care (Signed)
Immediate Anesthesia Transfer of Care Note  Patient: Jerry [name]  Procedure(s) Performed: Procedure(s) (LRB): IRRIGATION AND DEBRIDEMENT EXTREMITY (Left)  Patient Location: PACU  Anesthesia Type: General  Level of Consciousness: awake, oriented and patient cooperative  Airway & Oxygen Therapy: Patient Spontanous Breathing and Patient connected to nasal cannula oxygen  Post-op Assessment: Report given to PACU RN, Post -op Vital signs reviewed and stable and Patient moving all extremities X 4  Post vital signs: Reviewed and stable  Complications: No apparent anesthesia complications

## 2011-11-23 ENCOUNTER — Encounter (HOSPITAL_COMMUNITY): Payer: Self-pay | Admitting: Orthopedic Surgery

## 2011-11-23 NOTE — Op Note (Signed)
NAMEADRICK, Jerry [name]              ACCOUNT NO.:  [id number]  MEDICAL RECORD NO.:  [id number]  LOCATION:  [facility]                         FACILITY:  [facility]  PHYSICIAN:  Betha Loa, MD        DATE OF BIRTH:  05/24/1951  DATE OF PROCEDURE:  11/22/2011 DATE OF DISCHARGE:  11/22/2011                              OPERATIVE REPORT   PREOPERATIVE DIAGNOSIS:  Left forearm dog bite wounds.  POSTOPERATIVE DIAGNOSIS:  Left forearm dog bite wounds.  PROCEDURE:  Irrigation and debridement of left forearm dog bite wounds including sharp debridement of skin and subcutaneous tissue with a scalpel.  SURGEON:  Betha Loa, MD  ASSISTANT:  None.  ANESTHESIA:  General.  IV FLUIDS:  Per anesthesia flow sheet.  ESTIMATED BLOOD LOSS:  Minimal.  COMPLICATIONS:  None.  SPECIMENS:  Cultures to micro.  TOURNIQUET TIME:  29 minutes.  DISPOSITION:  Stable to PACU.  INDICATIONS:  Jerry [name] is a 61 year old right-hand dominant male who was bitten 2 days ago by his own dog on the left forearm.  He did not seek medical attention until today and he was referred to me for further care.  On evaluation, he had some erythema in the forearm.  He had bite wound on the dorsal radial aspect of the forearm with necrotic edges and a wound on the volar forearm with a small amount of purulence coming from it.  I recommended to Jerry [name] going to the operating room for irrigation and debridement of the wounds.  Risks, benefits, and alternatives of surgery were discussed including risk of blood loss; infection; damage to nerves, vessels, tendons, ligaments, bone; failure of surgery; need for additional surgery; complications; wound healing; continued pain; continued infection; need for repeat irrigation and debridement.  He voiced understanding of these risks and elected to proceed.  OPERATIVE COURSE:  After being identified preoperatively by myself, the patient and I agreed upon the procedure and site of  procedure.  The surgical site was marked.  The risks, benefits, and alternatives of the surgery were reviewed and he wished to proceed.  Surgical consent had been signed.  He was transferred to the operating room and placed on the operating room table in supine position with the left upper extremity on an armboard.  General anesthesia was induced by the anesthesiologist. The left upper extremity was prepped and draped in normal sterile orthopedic fashion.  Surgical pause was performed between the surgeons, anesthesia, and operating room staff and all were in agreement as to the patient, procedure, and site of procedure.  Tourniquet to the proximal aspect of the extremity was inflated to 250 mmHg after exsanguination of the limb with an Esmarch bandage.  The wound on the dorsum of the forearm was extended both proximally and distally.  There was malodorous tissue within it.  The tissue was removed using Ray-Tec sponges and rongeurs.  The skin edges and necrotic subcutaneous tissue were removed using a knife.  Attention was turned to the volar wound.  It was also extended both proximally and distally.  There was hematoma within here. There was a very small amount of purulence.  Culture swabs were taken and sent for aerobes, anaerobes,  AFB, fungus.  The wound was also debrided using the rongeurs.  Both wounds were copiously irrigated with 3000 mL of sterile saline by cysto tubing.  A small rent was made in the fascia in both wounds and there was no evidence of infection or purulence deep to the fascia.  The wounds were again copiously irrigated with sterile saline.  A 3-0 nylon was used to tack portion of the surgical wound, back closed well still leaving the wound open for drainage.  All wounds were packed with 0.25-inch iodoform packing.  They were dressed with sterile Xeroform.  A 10 mL of 0.25% plain Marcaine was injected in the skin to aid in postoperative analgesia.  The wounds  were dressed with sterile Xeroform, 4x4s, and ABD and wrapped with Kerlix and an Ace bandage.  The tourniquet was deflated at 30 minutes.  The fingertips were pink with brisk capillary refill after deflation of the tourniquet.  Operative drapes were broken down and the patient was awakened from anesthesia safely.  He was transferred back to the stretcher and taken to PACU in stable condition.  He was given vancomycin 1 g IV after cultures were taken.  He will follow up in the office on Monday for hydrotherapy and postoperative followup.  I will give him Percocet 5/325 1-2 p.o. q.6 hours p.r.n. pain, dispensed #40 and Bactrim DS 1 p.o. b.i.d. x7 days.  I will also ask him to contact his primary care physician for evaluation as his O2 sats were in the 80s preoperatively.  He states he did not feel unwell, dizzy, have chest pain or shortness of breath.     Betha Loa, MD     KK/MEDQ  D:  11/22/2011  T:  11/23/2011  Job:  [id number]

## 2011-11-25 LAB — CULTURE, ROUTINE-ABSCESS

## 2011-11-25 LAB — POCT I-STAT 4, (NA,K, GLUC, HGB,HCT): Glucose, Bld: 101 mg/dL — ABNORMAL HIGH (ref 70–99)

## 2011-11-27 LAB — ANAEROBIC CULTURE

## 2014-08-06 DIAGNOSIS — C413 Malignant neoplasm of ribs, sternum and clavicle: Secondary | ICD-10-CM

## 2014-08-06 HISTORY — DX: Malignant neoplasm of ribs, sternum and clavicle: C41.3

## 2015-03-03 ENCOUNTER — Other Ambulatory Visit: Payer: Self-pay | Admitting: Family Medicine

## 2015-03-03 DIAGNOSIS — J984 Other disorders of lung: Secondary | ICD-10-CM

## 2015-03-07 ENCOUNTER — Other Ambulatory Visit: Payer: No Typology Code available for payment source

## 2015-03-16 ENCOUNTER — Encounter: Payer: Self-pay | Admitting: Internal Medicine

## 2015-03-16 ENCOUNTER — Ambulatory Visit (INDEPENDENT_AMBULATORY_CARE_PROVIDER_SITE_OTHER): Payer: Medicare HMO | Admitting: Internal Medicine

## 2015-03-16 VITALS — BP 130/80 | HR 91 | Ht 74.0 in | Wt 294.0 lb

## 2015-03-16 DIAGNOSIS — F17210 Nicotine dependence, cigarettes, uncomplicated: Secondary | ICD-10-CM | POA: Diagnosis not present

## 2015-03-16 DIAGNOSIS — Z720 Tobacco use: Secondary | ICD-10-CM

## 2015-03-16 DIAGNOSIS — R222 Localized swelling, mass and lump, trunk: Secondary | ICD-10-CM

## 2015-03-16 NOTE — Patient Instructions (Signed)
We will arrange for a biopsy of your chest wall mass to be done by Interventional Radiology at [facility] and be in touch
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
## 2015-03-17 ENCOUNTER — Other Ambulatory Visit: Payer: Self-pay | Admitting: Internal Medicine

## 2015-03-17 ENCOUNTER — Encounter: Payer: Self-pay | Admitting: Internal Medicine

## 2015-03-17 DIAGNOSIS — F17210 Nicotine dependence, cigarettes, uncomplicated: Secondary | ICD-10-CM | POA: Insufficient documentation

## 2015-03-17 DIAGNOSIS — R222 Localized swelling, mass and lump, trunk: Secondary | ICD-10-CM

## 2015-03-17 NOTE — Assessment & Plan Note (Signed)
>   3 min discussion I reviewed the Fletcher curve with the patient that basically indicates  if you quit smoking when your best day FEV1 is still well preserved (as likely still  the case here)  it is highly unlikely you will progress to severe disease and informed the patient there was no medication on the market that has proven to alter the curve/ its downward trajectory  or the likelihood of progression of their disease.  Therefore stopping smoking and maintaining abstinence is the most important aspect of care, not choice of inhalers or for that matter, doctors.

## 2015-03-17 NOTE — Assessment & Plan Note (Addendum)
Concerned that this is not a lung process at all but rather some form of bony or soft tissue mass (? Sarcoma) and can't distinguish it from lung tissue s further imaging but since needs to be biopsied best option I can think of is have it ultrasounded and bx'd if possible in sitting position by IR > will discuss with IR   Discussed in detail all the  indications, usual  risks and alternatives  relative to the benefits with patient who agrees to proceed with  U/s  biopsy.   Total time = 18m review case with pt/ discussion/ counseling/ giving and going over instructions (see avs)

## 2015-03-17 NOTE — Assessment & Plan Note (Addendum)
Body mass index is 37.73 kg/(m^2).  No results found for: TSH   Contributing to back pain/ doe/reviewed need  achieve and maintain neg calorie balance > defer f/u primary care including intermittently monitoring thyroid status

## 2015-03-21 ENCOUNTER — Telehealth: Payer: Self-pay | Admitting: Internal Medicine

## 2015-03-21 NOTE — Telephone Encounter (Signed)
Called spoke with pt. He has not heard about when his u/s BX will be done yet. He can't do this Thursday. Please advise PCC's thanks

## 2015-03-21 NOTE — Telephone Encounter (Signed)
Called spoke with pt. Made aware of below. Nothing further needed 

## 2015-03-21 NOTE — Telephone Encounter (Signed)
These are scheduled by [facility]the  hospital & they call the pt with the appt.  He can call Central Scheduling [phone] to let them know any dates he is unavailable.

## 2015-03-21 NOTE — Telephone Encounter (Signed)
ATC pt and received busy signal x 3 wcb

## 2015-03-25 ENCOUNTER — Other Ambulatory Visit: Payer: Self-pay | Admitting: Physician Assistant

## 2015-03-28 ENCOUNTER — Ambulatory Visit (HOSPITAL_COMMUNITY)
Admission: RE | Admit: 2015-03-28 | Discharge: 2015-03-28 | Disposition: A | Discharge status: Home / Self Care | Payer: Medicare HMO | Source: Ambulatory Visit | Attending: Internal Medicine | Admitting: Internal Medicine

## 2015-03-28 ENCOUNTER — Ambulatory Visit (HOSPITAL_COMMUNITY): Payer: Medicare HMO

## 2015-03-28 ENCOUNTER — Encounter (HOSPITAL_COMMUNITY): Payer: Self-pay

## 2015-03-28 DIAGNOSIS — Z880 Allergy status to penicillin: Secondary | ICD-10-CM | POA: Diagnosis not present

## 2015-03-28 DIAGNOSIS — Z79899 Other long term (current) drug therapy: Secondary | ICD-10-CM | POA: Insufficient documentation

## 2015-03-28 DIAGNOSIS — F329 Major depressive disorder, single episode, unspecified: Secondary | ICD-10-CM | POA: Diagnosis not present

## 2015-03-28 DIAGNOSIS — G894 Chronic pain syndrome: Secondary | ICD-10-CM | POA: Insufficient documentation

## 2015-03-28 DIAGNOSIS — Z6837 Body mass index (BMI) 37.0-37.9, adult: Secondary | ICD-10-CM | POA: Insufficient documentation

## 2015-03-28 DIAGNOSIS — E785 Hyperlipidemia, unspecified: Secondary | ICD-10-CM | POA: Insufficient documentation

## 2015-03-28 DIAGNOSIS — I2510 Atherosclerotic heart disease of native coronary artery without angina pectoris: Secondary | ICD-10-CM | POA: Diagnosis not present

## 2015-03-28 DIAGNOSIS — I10 Essential (primary) hypertension: Secondary | ICD-10-CM | POA: Diagnosis not present

## 2015-03-28 DIAGNOSIS — Z7982 Long term (current) use of aspirin: Secondary | ICD-10-CM | POA: Diagnosis not present

## 2015-03-28 DIAGNOSIS — R222 Localized swelling, mass and lump, trunk: Secondary | ICD-10-CM | POA: Diagnosis present

## 2015-03-28 DIAGNOSIS — E669 Obesity, unspecified: Secondary | ICD-10-CM | POA: Insufficient documentation

## 2015-03-28 DIAGNOSIS — F17210 Nicotine dependence, cigarettes, uncomplicated: Secondary | ICD-10-CM | POA: Insufficient documentation

## 2015-03-28 DIAGNOSIS — Z8781 Personal history of (healed) traumatic fracture: Secondary | ICD-10-CM | POA: Insufficient documentation

## 2015-03-28 LAB — CBC
HEMATOCRIT: 41.9 % (ref 39.0–52.0)
HEMOGLOBIN: 13.5 g/dL (ref 13.0–17.0)
MCH: 29.9 pg (ref 26.0–34.0)
MCHC: 32.2 g/dL (ref 30.0–36.0)
MCV: 92.7 fL (ref 78.0–100.0)
Platelets: 151 10*3/uL (ref 150–400)
RBC: 4.52 MIL/uL (ref 4.22–5.81)
RDW: 13.1 % (ref 11.5–15.5)
WBC: 6 10*3/uL (ref 4.0–10.5)

## 2015-03-28 LAB — PROTIME-INR
INR: 1.03 (ref 0.00–1.49)
Prothrombin Time: 13.7 seconds (ref 11.6–15.2)

## 2015-03-28 LAB — APTT: APTT: 30 s (ref 24–37)

## 2015-03-28 MED ORDER — MIDAZOLAM HCL 2 MG/2ML IJ SOLN
INTRAMUSCULAR | Status: AC | PRN
  Administered 2015-03-28 (×2): 1 mg via INTRAVENOUS

## 2015-03-28 MED ORDER — MIDAZOLAM HCL 2 MG/2ML IJ SOLN
INTRAMUSCULAR | Status: AC
  Filled 2015-03-28: qty 6

## 2015-03-28 MED ORDER — FLUMAZENIL 0.5 MG/5ML IV SOLN
INTRAVENOUS | Status: AC
  Filled 2015-03-28: qty 5

## 2015-03-28 MED ORDER — NALOXONE HCL 0.4 MG/ML IJ SOLN
INTRAMUSCULAR | Status: AC
  Filled 2015-03-28: qty 1

## 2015-03-28 MED ORDER — SODIUM CHLORIDE 0.9 % IV SOLN
INTRAVENOUS | Status: DC
  Administered 2015-03-28: 13:00:00 via INTRAVENOUS

## 2015-03-28 MED ORDER — FENTANYL CITRATE (PF) 100 MCG/2ML IJ SOLN
INTRAMUSCULAR | Status: AC | PRN
  Administered 2015-03-28: 50 ug via INTRAVENOUS

## 2015-03-28 MED ORDER — FENTANYL CITRATE (PF) 100 MCG/2ML IJ SOLN
INTRAMUSCULAR | Status: AC
  Filled 2015-03-28: qty 4

## 2015-03-28 NOTE — Procedures (Signed)
Successful RT CHEST WALL MASS CORE BX NO COMP STABLE PATH PENDING FULL REPORT IN PACS
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
## 2015-03-28 NOTE — Discharge Instructions (Signed)
Needle Biopsy °Care After °These instructions give you information on caring for yourself after your procedure. Your doctor may also give you more specific instructions. Call your doctor if you have any problems or questions after your procedure. °HOME CARE °· Rest for 4 hours after your biopsy, except for getting up to go to the bathroom or as told. °· Keep the places where the needles were put in clean and dry. °¨ Do not put powder or lotion on the sites. °¨ Do not shower until 24 hours after the test. Remove all bandages (dressings) before showering. °¨ Remove all bandages at least once every day. Gently clean the sites with soap and water. Keep putting a new bandage on until the skin is closed. °Finding out the results of your test °Ask your doctor when your test results will be ready. Make sure you follow up and get the test results. °GET HELP RIGHT AWAY IF:  °· You have shortness of breath or trouble breathing. °· You have pain or cramping in your belly (abdomen). °· You feel sick to your stomach (nauseous) or throw up (vomit). °· Any of the places where the needles were put in: °¨ Are puffy (swollen) or red. °¨ Are sore or hot to the touch. °¨ Are draining yellowish-white fluid (pus). °¨ Are bleeding after 10 minutes of pressing down on the site. Have someone keep pressing on any place that is bleeding until you see a doctor. °· You have any unusual pain that will not stop. °· You have a fever. °If you go to the emergency room, tell the nurse that you had a biopsy. Take this paper with you to show the nurse. °MAKE SURE YOU:  °· Understand these instructions. °· Will watch your condition. °· Will get help right away if you are not doing well or get worse. °Document Released: 07/05/2008 Document Revised: 10/15/2011 Document Reviewed: 07/05/2008 °ExitCare® Patient Information ©2015 [employer]. This information is not intended to replace advice given to you by your health care provider. Make sure you discuss  any questions you have with your health care provider. °Conscious Sedation °Sedation is the use of medicines to promote relaxation and relieve discomfort and anxiety. Conscious sedation is a type of sedation. Under conscious sedation you are less alert than normal but are still able to respond to instructions or stimulation. Conscious sedation is used during short medical and dental procedures. It is milder than deep sedation or general anesthesia and allows you to return to your regular activities sooner.  °LET YOUR HEALTH CARE PROVIDER KNOW ABOUT:  °· Any allergies you have. °· All medicines you are taking, including vitamins, herbs, eye drops, creams, and over-the-counter medicines. °· Use of steroids (by mouth or creams). °· Previous problems you or members of your family have had with the use of anesthetics. °· Any blood disorders you have. °· Previous surgeries you have had. °· Medical conditions you have. °· Possibility of pregnancy, if this applies. °· Use of cigarettes, alcohol, or illegal drugs. °RISKS AND COMPLICATIONS °Generally, this is a safe procedure. However, as with any procedure, problems can occur. Possible problems include: °· Oversedation. °· Trouble breathing on your own. You may need to have a breathing tube until you are awake and breathing on your own. °· Allergic reaction to any of the medicines used for the procedure. °BEFORE THE PROCEDURE °· You may have blood tests done. These tests can help show how well your kidneys and liver are working. They can also   show how well your blood clots. °· A physical exam will be done.   °· Only take medicines as directed by your health care provider. You may need to stop taking medicines (such as blood thinners, aspirin, or nonsteroidal anti-inflammatory drugs) before the procedure.   °· Do not eat or drink at least 6 hours before the procedure or as directed by your health care provider. °· Arrange for a responsible adult, family member, or friend to  take you home after the procedure. He or she should stay with you for at least 24 hours after the procedure, until the medicine has worn off. °PROCEDURE  °· An intravenous (IV) catheter will be inserted into one of your veins. Medicine will be able to flow directly into your body through this catheter. You may be given medicine through this tube to help prevent pain and help you relax. °· The medical or dental procedure will be done. °AFTER THE PROCEDURE °· You will stay in a recovery area until the medicine has worn off. Your blood pressure and pulse will be checked.   °·  Depending on the procedure you had, you may be allowed to go home when you can tolerate liquids and your pain is under control. °Document Released: 04/17/2001 Document Revised: 07/28/2013 Document Reviewed: 03/30/2013 °ExitCare® Patient Information ©2015 [employer]. This information is not intended to replace advice given to you by your health care provider. Make sure you discuss any questions you have with your health care provider. ° °

## 2015-04-07 ENCOUNTER — Telehealth: Payer: Self-pay | Admitting: Internal Medicine

## 2015-04-07 DIAGNOSIS — R222 Localized swelling, mass and lump, trunk: Secondary | ICD-10-CM

## 2015-04-07 NOTE — Telephone Encounter (Signed)
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

## 2015-04-07 NOTE — Telephone Encounter (Signed)
Please advise thanks.

## 2015-04-07 NOTE — Telephone Encounter (Signed)
Dr Melvyn Novas called and spoke with the pt  I have sent referral for [facility]

## 2015-04-13 ENCOUNTER — Encounter: Payer: Self-pay | Admitting: Thoracic Surgery (Cardiothoracic Vascular Surgery)

## 2015-04-13 ENCOUNTER — Institutional Professional Consult (permissible substitution) (INDEPENDENT_AMBULATORY_CARE_PROVIDER_SITE_OTHER): Payer: Medicare HMO | Admitting: Thoracic Surgery (Cardiothoracic Vascular Surgery)

## 2015-04-13 ENCOUNTER — Other Ambulatory Visit: Payer: Self-pay | Admitting: *Deleted

## 2015-04-13 VITALS — BP 129/71 | HR 83 | Resp 20 | Ht 74.0 in | Wt 294.0 lb

## 2015-04-13 DIAGNOSIS — R222 Localized swelling, mass and lump, trunk: Secondary | ICD-10-CM | POA: Diagnosis not present
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
## 2015-04-15 ENCOUNTER — Other Ambulatory Visit: Payer: Self-pay | Admitting: *Deleted

## 2015-04-15 DIAGNOSIS — R222 Localized swelling, mass and lump, trunk: Secondary | ICD-10-CM

## 2015-04-18 ENCOUNTER — Encounter: Payer: Self-pay | Admitting: Interventional Cardiology

## 2015-04-18 ENCOUNTER — Ambulatory Visit (INDEPENDENT_AMBULATORY_CARE_PROVIDER_SITE_OTHER): Payer: Medicare HMO | Admitting: Interventional Cardiology

## 2015-04-18 VITALS — BP 122/72 | HR 66 | Ht 74.0 in | Wt 290.0 lb

## 2015-04-18 DIAGNOSIS — I2510 Atherosclerotic heart disease of native coronary artery without angina pectoris: Secondary | ICD-10-CM

## 2015-04-18 DIAGNOSIS — Z720 Tobacco use: Secondary | ICD-10-CM | POA: Diagnosis not present

## 2015-04-18 DIAGNOSIS — R222 Localized swelling, mass and lump, trunk: Secondary | ICD-10-CM

## 2015-04-18 DIAGNOSIS — Z01810 Encounter for preprocedural cardiovascular examination: Secondary | ICD-10-CM | POA: Diagnosis not present

## 2015-04-18 DIAGNOSIS — R609 Edema, unspecified: Secondary | ICD-10-CM
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
## 2015-04-18 NOTE — Patient Instructions (Signed)
Medication Instructions:  Your physician recommends that you continue on your current medications as directed. Please refer to the Current Medication list given to you today.   Labwork: none  Testing/Procedures: Your physician has requested that you have an echocardiogram. Echocardiography is a painless test that uses sound waves to create images of your heart. It provides your doctor with information about the size and shape of your heart and how well your heart's chambers and valves are working. This procedure takes approximately one hour. There are no restrictions for this procedure.    Follow-Up: Will be determined after echo.   Any Other Special Instructions Will Be Listed Below (If Applicable).

## 2015-04-19 ENCOUNTER — Ambulatory Visit (HOSPITAL_COMMUNITY)
Admission: RE | Admit: 2015-04-19 | Discharge: 2015-04-19 | Disposition: A | Discharge status: Home / Self Care | Payer: Medicare HMO | Source: Ambulatory Visit | Attending: Thoracic Surgery (Cardiothoracic Vascular Surgery) | Admitting: Thoracic Surgery (Cardiothoracic Vascular Surgery)

## 2015-04-19 DIAGNOSIS — R222 Localized swelling, mass and lump, trunk: Secondary | ICD-10-CM | POA: Diagnosis present

## 2015-04-19 LAB — PULMONARY FUNCTION TEST
DL/VA % pred: 115 %
DL/VA: 5.3 ml/min/mmHg/L
DLCO UNC: 28.66 ml/min/mmHg
DLCO unc % pred: 88 %
FEF 25-75 Post: 2.52 L/sec
FEF 25-75 Pre: 2.21 L/sec
FEF2575-%Change-Post: 14 %
FEF2575-%PRED-PRE: 79 %
FEF2575-%Pred-Post: 90 %
FEV1-%Change-Post: 2 %
FEV1-%PRED-POST: 75 %
FEV1-%PRED-PRE: 74 %
FEV1-PRE: 2.58 L
FEV1-Post: 2.64 L
FEV1FVC-%Change-Post: 0 %
FEV1FVC-%Pred-Pre: 102 %
FEV6-%CHANGE-POST: 4 %
FEV6-%PRED-POST: 77 %
FEV6-%PRED-PRE: 73 %
FEV6-POST: 3.41 L
FEV6-Pre: 3.25 L
FEV6FVC-%CHANGE-POST: 2 %
FEV6FVC-%PRED-POST: 104 %
FEV6FVC-%Pred-Pre: 102 %
FVC-%Change-Post: 2 %
FVC-%PRED-PRE: 71 %
FVC-%Pred-Post: 73 %
FVC-POST: 3.44 L
FVC-PRE: 3.34 L
POST FEV6/FVC RATIO: 99 %
PRE FEV1/FVC RATIO: 77 %
Post FEV1/FVC ratio: 77 %
Pre FEV6/FVC Ratio: 97 %
RV % pred: 120 %
RV: 2.79 L
TLC % PRED: 90 %
TLC: 6.31 L

## 2015-04-19 MED ORDER — ALBUTEROL SULFATE (2.5 MG/3ML) 0.083% IN NEBU
2.5000 mg | INHALATION_SOLUTION | Freq: Once | RESPIRATORY_TRACT | Status: AC
  Administered 2015-04-19: 2.5 mg via RESPIRATORY_TRACT

## 2015-04-22 ENCOUNTER — Encounter (HOSPITAL_COMMUNITY): Payer: Self-pay

## 2015-04-22 ENCOUNTER — Encounter: Payer: Self-pay | Admitting: Cardiology

## 2015-04-22 ENCOUNTER — Encounter (HOSPITAL_COMMUNITY): Payer: Self-pay | Admitting: *Deleted

## 2015-04-22 NOTE — Progress Notes (Signed)
   04/22/15 1250  OBSTRUCTIVE SLEEP APNEA  Have you ever been diagnosed with sleep apnea through a sleep study? No  Do you snore loudly (loud enough to be heard through closed doors)?  0  Do you often feel tired, fatigued, or sleepy during the daytime (such as falling asleep during driving or talking to someone)? 1  Has anyone observed you stop breathing during your sleep? 0  Do you have, or are you being treated for high blood pressure? 1  BMI more than 35 kg/m2? 1  Age > 49 (1-yes) 1  Male Gender (Yes=1) 1  Obstructive Sleep Apnea Score 5  Score 5 or greater  Results sent to PCP

## 2015-04-22 NOTE — Progress Notes (Signed)
Anesthesia Chart Review:  Pt is 64 year old male scheduled for CT of chest with anesthesia to evaluate a chest wall mass 04/25/2015.   PCP is Bing Matter, [place]. Cardiologist is Dr. Irish Lack.   Pt is a same day work up.   PMH includes: CAD (stent to OM 2000), HTN, hyperlipidemia. Current smoker. BMI 37. S/p irrigation and debridement LUE 11/22/11.  Medications include: ASA, doxazosin, lasix, iron, lovastatin, potassium.   BMET and CBC will be obtained DOS.   EKG 04/18/2015: NSR.   Cardiac cath 09/01/2009: 1. Nonobstructive CAD 2. Normal LV function  Pt has an echo scheduled for 04/28/15 as part of pre-op eval for chest mass removal surgery.   Reviewed case with Dr. Kalman Shan.   If no changes, I anticipate pt can proceed with CT as scheduled.   Willeen Cass, FNP-BC [facility]/Anesthesiology Phone: [phone] 04/22/2015 2:18 PM

## 2015-04-22 NOTE — Progress Notes (Signed)
Pt has a cardiac history, had angioplasty in 1996. Last cath was 2011. Pt was seen by Dr. Irish Lack this week for pre-op clearance for upcoming thoracic surgery. Pt has an Echo scheduled for 04/28/15. Pt denies any recent chest pain or sob.  Pt's PCP is Bing Matter, [place]

## 2015-04-25 ENCOUNTER — Ambulatory Visit (HOSPITAL_COMMUNITY)
Admission: RE | Admit: 2015-04-25 | Discharge: 2015-04-25 | Disposition: A | Discharge status: Home / Self Care | Payer: Medicare HMO | Source: Ambulatory Visit | Attending: Thoracic Surgery (Cardiothoracic Vascular Surgery) | Admitting: Thoracic Surgery (Cardiothoracic Vascular Surgery)

## 2015-04-25 ENCOUNTER — Encounter (HOSPITAL_COMMUNITY): Payer: Self-pay | Admitting: *Deleted

## 2015-04-25 ENCOUNTER — Ambulatory Visit (HOSPITAL_COMMUNITY): Payer: Medicare HMO | Admitting: Anesthesiology

## 2015-04-25 ENCOUNTER — Encounter (HOSPITAL_COMMUNITY)
Admission: RE | Disposition: A | Discharge status: Home / Self Care | Payer: Self-pay | Source: Ambulatory Visit | Attending: Thoracic Surgery (Cardiothoracic Vascular Surgery)

## 2015-04-25 DIAGNOSIS — Z955 Presence of coronary angioplasty implant and graft: Secondary | ICD-10-CM | POA: Diagnosis not present

## 2015-04-25 DIAGNOSIS — E669 Obesity, unspecified: Secondary | ICD-10-CM | POA: Insufficient documentation

## 2015-04-25 DIAGNOSIS — I2510 Atherosclerotic heart disease of native coronary artery without angina pectoris: Secondary | ICD-10-CM | POA: Diagnosis not present

## 2015-04-25 DIAGNOSIS — F17210 Nicotine dependence, cigarettes, uncomplicated: Secondary | ICD-10-CM | POA: Insufficient documentation

## 2015-04-25 DIAGNOSIS — R222 Localized swelling, mass and lump, trunk: Secondary | ICD-10-CM

## 2015-04-25 DIAGNOSIS — I10 Essential (primary) hypertension: Secondary | ICD-10-CM | POA: Diagnosis not present

## 2015-04-25 DIAGNOSIS — G894 Chronic pain syndrome: Secondary | ICD-10-CM | POA: Insufficient documentation

## 2015-04-25 DIAGNOSIS — Z7982 Long term (current) use of aspirin: Secondary | ICD-10-CM | POA: Diagnosis not present

## 2015-04-25 DIAGNOSIS — Z79899 Other long term (current) drug therapy: Secondary | ICD-10-CM | POA: Insufficient documentation

## 2015-04-25 DIAGNOSIS — E785 Hyperlipidemia, unspecified: Secondary | ICD-10-CM | POA: Diagnosis not present

## 2015-04-25 DIAGNOSIS — Z79891 Long term (current) use of opiate analgesic: Secondary | ICD-10-CM | POA: Diagnosis not present

## 2015-04-25 DIAGNOSIS — I252 Old myocardial infarction: Secondary | ICD-10-CM | POA: Diagnosis not present

## 2015-04-25 DIAGNOSIS — F329 Major depressive disorder, single episode, unspecified: Secondary | ICD-10-CM | POA: Insufficient documentation

## 2015-04-25 DIAGNOSIS — D492 Neoplasm of unspecified behavior of bone, soft tissue, and skin: Secondary | ICD-10-CM | POA: Diagnosis present

## 2015-04-25 DIAGNOSIS — Z7951 Long term (current) use of inhaled steroids: Secondary | ICD-10-CM | POA: Diagnosis not present

## 2015-04-25 HISTORY — PX: RADIOLOGY WITH ANESTHESIA: SHX6223

## 2015-04-25 HISTORY — DX: Drug induced constipation: K59.03

## 2015-04-25 HISTORY — DX: Depression, unspecified: F32.A

## 2015-04-25 HISTORY — DX: Major depressive disorder, single episode, unspecified: F32.9

## 2015-04-25 LAB — BASIC METABOLIC PANEL
Anion gap: 8 (ref 5–15)
BUN: 8 mg/dL (ref 6–20)
CALCIUM: 9.2 mg/dL (ref 8.9–10.3)
CO2: 31 mmol/L (ref 22–32)
CREATININE: 1.31 mg/dL — AB (ref 0.61–1.24)
Chloride: 100 mmol/L — ABNORMAL LOW (ref 101–111)
GFR calc Af Amer: >60 mL/min (ref >60)
GFR calc non Af Amer: 56 mL/min — ABNORMAL LOW (ref >60)
GLUCOSE: 119 mg/dL — AB (ref 65–99)
Potassium: 3.9 mmol/L (ref 3.5–5.1)
Sodium: 139 mmol/L (ref 135–145)

## 2015-04-25 SURGERY — RADIOLOGY WITH ANESTHESIA
Anesthesia: General | Site: Chest

## 2015-04-25 MED ORDER — ACETAMINOPHEN 10 MG/ML IV SOLN
INTRAVENOUS | Status: AC
  Filled 2015-04-25: qty 100

## 2015-04-25 MED ORDER — ACETAMINOPHEN 10 MG/ML IV SOLN
1000.0000 mg | Freq: Once | INTRAVENOUS | Status: AC
  Administered 2015-04-25: 1000 mg via INTRAVENOUS

## 2015-04-25 MED ORDER — SUCCINYLCHOLINE CHLORIDE 20 MG/ML IJ SOLN
INTRAMUSCULAR | Status: DC | PRN
  Administered 2015-04-25: 140 mg via INTRAVENOUS

## 2015-04-25 MED ORDER — ONDANSETRON HCL 4 MG/2ML IJ SOLN
INTRAMUSCULAR | Status: DC | PRN
  Administered 2015-04-25: 4 mg via INTRAVENOUS

## 2015-04-25 MED ORDER — PROPOFOL 10 MG/ML IV BOLUS
INTRAVENOUS | Status: DC | PRN
  Administered 2015-04-25: 150 mg via INTRAVENOUS

## 2015-04-25 MED ORDER — LACTATED RINGERS IV SOLN
INTRAVENOUS | Status: DC
  Administered 2015-04-25: 08:00:00 via INTRAVENOUS

## 2015-04-25 MED ORDER — FENTANYL CITRATE (PF) 100 MCG/2ML IJ SOLN
INTRAMUSCULAR | Status: DC | PRN
  Administered 2015-04-25 (×2): 50 ug via INTRAVENOUS

## 2015-04-25 MED ORDER — HYDROMORPHONE HCL 1 MG/ML IJ SOLN
0.2500 mg | INTRAMUSCULAR | Status: DC | PRN
  Administered 2015-04-25 (×2): 0.5 mg via INTRAVENOUS

## 2015-04-25 MED ORDER — HYDROMORPHONE HCL 1 MG/ML IJ SOLN
INTRAMUSCULAR | Status: AC
Start: 2015-04-25 — End: 2015-04-25
  Administered 2015-04-25: 0.5 mg via INTRAVENOUS
  Filled 2015-04-25: qty 1

## 2015-04-25 MED ORDER — PROMETHAZINE HCL 25 MG/ML IJ SOLN
6.2500 mg | INTRAMUSCULAR | Status: DC | PRN
Start: 2015-04-25 — End: 2015-04-25

## 2015-04-25 NOTE — Anesthesia Procedure Notes (Signed)
Procedure Name: Intubation Date/Time: 04/25/2015 8:47 AM Performed by: Maryland Pink Pre-anesthesia Checklist: Patient identified, Emergency Drugs available, Suction available, Patient being monitored and Timeout performed Patient Re-evaluated:Patient Re-evaluated prior to inductionOxygen Delivery Method: Circle system utilized Preoxygenation: Pre-oxygenation with 100% oxygen Intubation Type: IV induction Ventilation: Mask ventilation without difficulty and Oral airway inserted - appropriate to patient size Laryngoscope Size: Mac and 4 Grade View: Grade I Tube type: Oral Tube size: 7.5 mm Number of attempts: 1 Airway Equipment and Method: Stylet and LTA kit utilized Placement Confirmation: ETT inserted through vocal cords under direct vision,  breath sounds checked- equal and bilateral and positive ETCO2 Secured at: 23 cm Tube secured with: Tape Dental Injury: Teeth and Oropharynx as per pre-operative assessment

## 2015-04-25 NOTE — Anesthesia Preprocedure Evaluation (Addendum)
Anesthesia Evaluation  Patient identified by MRN, date of birth, ID band Patient awake    Reviewed: Allergy & Precautions, NPO status , Patient's Chart, lab work & pertinent test results  Airway Mallampati: II  TM Distance: <3 FB Neck ROM: Full    Dental no notable dental hx.    Pulmonary Current Smoker,    Pulmonary exam normal breath sounds clear to auscultation       Cardiovascular hypertension, Pt. on medications + CAD and + Cardiac Stents  Normal cardiovascular exam Rhythm:Regular Rate:Normal     Neuro/Psych negative neurological ROS  negative psych ROS   GI/Hepatic negative GI ROS, Neg liver ROS,   Endo/Other  Morbid obesity  Renal/GU negative Renal ROS  negative genitourinary   Musculoskeletal negative musculoskeletal ROS (+)   Abdominal   Peds negative pediatric ROS (+)  Hematology negative hematology ROS (+)   Anesthesia Other Findings   Reproductive/Obstetrics negative OB ROS                            Anesthesia Physical Anesthesia Plan  ASA: III  Anesthesia Plan: General   Post-op Pain Management:    Induction: Intravenous  Airway Management Planned: Oral ETT  Additional Equipment:   Intra-op Plan:   Post-operative Plan: Extubation in OR  Informed Consent: I have reviewed the patients History and Physical, chart, labs and discussed the procedure including the risks, benefits and alternatives for the proposed anesthesia with the patient or authorized representative who has indicated his/her understanding and acceptance.   Dental advisory given  Plan Discussed with: CRNA and Surgeon  Anesthesia Plan Comments:         Anesthesia Quick Evaluation

## 2015-04-25 NOTE — Transfer of Care (Signed)
Immediate Anesthesia Transfer of Care Note  Patient: Jerry [name]  Procedure(s) Performed: Procedure(s) with comments: CT chest without contrast (N/A) - Hendrickson's order  Patient Location: PACU  Anesthesia Type:General  Level of Consciousness: awake, alert  and oriented  Airway & Oxygen Therapy: Patient Spontanous Breathing and Patient connected to nasal cannula oxygen  Post-op Assessment: Report given to RN and Post -op Vital signs reviewed and stable  Post vital signs: Reviewed and stable  Last Vitals:  Filed Vitals:   04/25/15 0944  BP: 135/55  Pulse: 48  Temp:   Resp: 15    Complications: No apparent anesthesia complications
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
## 2015-04-26 ENCOUNTER — Encounter (HOSPITAL_COMMUNITY): Payer: Self-pay | Admitting: Radiology

## 2015-04-26 ENCOUNTER — Ambulatory Visit (INDEPENDENT_AMBULATORY_CARE_PROVIDER_SITE_OTHER): Payer: Medicare HMO | Admitting: Thoracic Surgery (Cardiothoracic Vascular Surgery)

## 2015-04-26 ENCOUNTER — Other Ambulatory Visit: Payer: Medicare HMO | Admitting: *Deleted

## 2015-04-26 VITALS — BP 127/78 | HR 73 | Resp 16 | Ht 74.0 in | Wt 294.0 lb

## 2015-04-26 DIAGNOSIS — R222 Localized swelling, mass and lump, trunk: Secondary | ICD-10-CM | POA: Diagnosis not present
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
## 2015-04-26 NOTE — Anesthesia Postprocedure Evaluation (Signed)
  Anesthesia Post-op Note  Patient: Jerry [name]  Procedure(s) Performed: Procedure(s) (LRB): CT chest without contrast (N/A)  Patient Location: PACU  Anesthesia Type: General  Level of Consciousness: awake and alert   Airway and Oxygen Therapy: Patient Spontanous Breathing  Post-op Pain: mild  Post-op Assessment: Post-op Vital signs reviewed, Patient's Cardiovascular Status Stable, Respiratory Function Stable, Patent Airway and No signs of Nausea or vomiting  Last Vitals:  Filed Vitals:   04/25/15 1024  BP: 115/64  Pulse: 49  Temp:   Resp: 18    Post-op Vital Signs: stable   Complications: No apparent anesthesia complications

## 2015-04-28 ENCOUNTER — Other Ambulatory Visit: Payer: Self-pay

## 2015-04-28 ENCOUNTER — Ambulatory Visit (HOSPITAL_COMMUNITY): Payer: Medicare HMO | Attending: Cardiology

## 2015-04-28 ENCOUNTER — Telehealth: Payer: Self-pay | Admitting: Interventional Cardiology

## 2015-04-28 DIAGNOSIS — I2510 Atherosclerotic heart disease of native coronary artery without angina pectoris: Secondary | ICD-10-CM | POA: Insufficient documentation

## 2015-04-28 DIAGNOSIS — F17200 Nicotine dependence, unspecified, uncomplicated: Secondary | ICD-10-CM | POA: Diagnosis not present

## 2015-04-28 DIAGNOSIS — I10 Essential (primary) hypertension: Secondary | ICD-10-CM | POA: Diagnosis not present

## 2015-04-28 NOTE — Telephone Encounter (Signed)
Normal LV function by echo.  Adequate valvular function.  EF 55-60%.  No further cardiac testing planned.

## 2015-04-28 NOTE — Telephone Encounter (Signed)
Left message to call back  

## 2015-04-29 NOTE — Telephone Encounter (Signed)
Informed pt of echo results. Pt verbalized understanding. 

## 2015-05-02 ENCOUNTER — Encounter (HOSPITAL_COMMUNITY)
Admission: RE | Admit: 2015-05-02 | Discharge: 2015-05-02 | Disposition: A | Discharge status: Home / Self Care | Payer: Medicare HMO | Source: Ambulatory Visit | Attending: Thoracic Surgery (Cardiothoracic Vascular Surgery) | Admitting: Thoracic Surgery (Cardiothoracic Vascular Surgery)

## 2015-05-02 ENCOUNTER — Encounter (HOSPITAL_COMMUNITY): Payer: Self-pay

## 2015-05-02 ENCOUNTER — Ambulatory Visit (HOSPITAL_COMMUNITY)
Admission: RE | Admit: 2015-05-02 | Discharge: 2015-05-02 | Disposition: A | Discharge status: Home / Self Care | Payer: Medicare HMO | Source: Ambulatory Visit | Attending: Thoracic Surgery (Cardiothoracic Vascular Surgery) | Admitting: Thoracic Surgery (Cardiothoracic Vascular Surgery)

## 2015-05-02 VITALS — BP 131/64 | HR 66 | Temp 97.9°F | Resp 18 | Ht 72.0 in | Wt 286.6 lb

## 2015-05-02 DIAGNOSIS — I2510 Atherosclerotic heart disease of native coronary artery without angina pectoris: Secondary | ICD-10-CM | POA: Insufficient documentation

## 2015-05-02 DIAGNOSIS — Z7982 Long term (current) use of aspirin: Secondary | ICD-10-CM

## 2015-05-02 DIAGNOSIS — Z79899 Other long term (current) drug therapy: Secondary | ICD-10-CM | POA: Insufficient documentation

## 2015-05-02 DIAGNOSIS — F17200 Nicotine dependence, unspecified, uncomplicated: Secondary | ICD-10-CM

## 2015-05-02 DIAGNOSIS — E785 Hyperlipidemia, unspecified: Secondary | ICD-10-CM | POA: Insufficient documentation

## 2015-05-02 DIAGNOSIS — Z01812 Encounter for preprocedural laboratory examination: Secondary | ICD-10-CM

## 2015-05-02 DIAGNOSIS — I10 Essential (primary) hypertension: Secondary | ICD-10-CM | POA: Insufficient documentation

## 2015-05-02 DIAGNOSIS — R222 Localized swelling, mass and lump, trunk: Secondary | ICD-10-CM

## 2015-05-02 DIAGNOSIS — Z0183 Encounter for blood typing: Secondary | ICD-10-CM | POA: Insufficient documentation

## 2015-05-02 DIAGNOSIS — Z01818 Encounter for other preprocedural examination: Secondary | ICD-10-CM

## 2015-05-02 HISTORY — DX: Anxiety disorder, unspecified: F41.9

## 2015-05-02 HISTORY — DX: Personal history of other medical treatment: Z92.89

## 2015-05-02 HISTORY — DX: Myoneural disorder, unspecified: G70.9

## 2015-05-02 LAB — COMPREHENSIVE METABOLIC PANEL
ALBUMIN: 3.8 g/dL (ref 3.5–5.0)
ALK PHOS: 66 U/L (ref 38–126)
ALT: 25 U/L (ref 17–63)
ANION GAP: 9 (ref 5–15)
AST: 25 U/L (ref 15–41)
BILIRUBIN TOTAL: 0.5 mg/dL (ref 0.3–1.2)
BUN: 8 mg/dL (ref 6–20)
CALCIUM: 9.3 mg/dL (ref 8.9–10.3)
CO2: 31 mmol/L (ref 22–32)
Chloride: 100 mmol/L — ABNORMAL LOW (ref 101–111)
Creatinine, Ser: 0.95 mg/dL (ref 0.61–1.24)
GFR calc non Af Amer: >60 mL/min (ref >60)
GLUCOSE: 91 mg/dL (ref 65–99)
Potassium: 3.8 mmol/L (ref 3.5–5.1)
Sodium: 140 mmol/L (ref 135–145)
TOTAL PROTEIN: 7.1 g/dL (ref 6.5–8.1)

## 2015-05-02 LAB — BLOOD GAS, ARTERIAL
Acid-Base Excess: 8.5 mmol/L — ABNORMAL HIGH (ref 0.0–2.0)
BICARBONATE: 33.2 meq/L — AB (ref 20.0–24.0)
Drawn by: 206361
FIO2: 0.21
O2 Saturation: 90.7 %
PCO2 ART: 51.9 mmHg — AB (ref 35.0–45.0)
PH ART: 7.422 (ref 7.350–7.450)
PO2 ART: 58.8 mmHg — AB (ref 80.0–100.0)
Patient temperature: 98.6
TCO2: 34.8 mmol/L (ref 0–100)

## 2015-05-02 LAB — CBC
HCT: 44.8 % (ref 39.0–52.0)
HEMOGLOBIN: 15.5 g/dL (ref 13.0–17.0)
MCH: 31.1 pg (ref 26.0–34.0)
MCHC: 34.6 g/dL (ref 30.0–36.0)
MCV: 90 fL (ref 78.0–100.0)
Platelets: 154 10*3/uL (ref 150–400)
RBC: 4.98 MIL/uL (ref 4.22–5.81)
RDW: 12.9 % (ref 11.5–15.5)
WBC: 6.4 10*3/uL (ref 4.0–10.5)

## 2015-05-02 LAB — SURGICAL PCR SCREEN
MRSA, PCR: NEGATIVE
Staphylococcus aureus: NEGATIVE

## 2015-05-02 LAB — URINALYSIS, ROUTINE W REFLEX MICROSCOPIC
BILIRUBIN URINE: NEGATIVE
GLUCOSE, UA: NEGATIVE mg/dL
HGB URINE DIPSTICK: NEGATIVE
KETONES UR: NEGATIVE mg/dL
LEUKOCYTES UA: NEGATIVE
Nitrite: NEGATIVE
PROTEIN: NEGATIVE mg/dL
Specific Gravity, Urine: 1.005 (ref 1.005–1.030)
Urobilinogen, UA: 0.2 mg/dL (ref 0.0–1.0)
pH: 7 (ref 5.0–8.0)

## 2015-05-02 LAB — PROTIME-INR
INR: 1.11 (ref 0.00–1.49)
Prothrombin Time: 14.4 seconds (ref 11.6–15.2)

## 2015-05-02 LAB — APTT: aPTT: 30 seconds (ref 24–37)

## 2015-05-02 NOTE — Pre-Procedure Instructions (Signed)
Jerry [name]  05/02/2015      [facility] [address], [place] - [address] [place] [address] Phone: [phone] Fax: [phone]    Your procedure is scheduled on 05/05/2015.  Report to [facility] Admitting at 5:30 A.M.  Call this number if you have problems the morning of surgery:  [phone]   Remember:  Do not eat food or drink liquids after midnight.  Take these medicines the morning of surgery with A SIP OF WATER : DIAZEPAM, use your inhaler- SYMBICORT, take pain medicine if you need it    Do not wear jewelry   Do not wear lotions, powders, or perfumes.  You may wear deodorant.     Men may shave face and neck.   Do not bring valuables to the hospital.   [facility] is not responsible for any belongings or valuables.  Contacts, dentures or bridgework may not be worn into surgery.  Leave your suitcase in the car.  After surgery it may be brought to your room.  For patients admitted to the hospital, discharge time will be determined by your treatment team.  Patients discharged the day of surgery will not be allowed to drive home.   Name and phone number of your driver:   With family    Special instructions:Special Instructions: [place] - Preparing for Surgery  Before surgery, you can play an important role.  Because skin is not sterile, your skin needs to be as free of germs as possible.  You can reduce the number of germs on you skin by washing with CHG (chlorahexidine gluconate) soap before surgery.  CHG is an antiseptic cleaner which kills germs and bonds with the skin to continue killing germs even after washing.  Please DO NOT use if you have an allergy to CHG or antibacterial soaps.  If your skin becomes reddened/irritated stop using the CHG and inform your nurse when you arrive at Short Stay.  Do not shave (including legs and underarms) for at least 48 hours prior to the first CHG shower.   You may shave your face.  Please follow these instructions carefully:   1.  Shower with CHG Soap the night before surgery and the  morning of Surgery.  2.  If you choose to wash your hair, wash your hair first as usual with your  normal shampoo.  3.  After you shampoo, rinse your hair and body thoroughly to remove the  Shampoo.  4.  Use CHG as you would any other liquid soap.  You can apply chg directly to the skin and wash gently with scrungie or a clean washcloth.  5.  Apply the CHG Soap to your body ONLY FROM THE NECK DOWN.    Do not use on open wounds or open sores.  Avoid contact with your eyes, ears, mouth and genitals (private parts).  Wash genitals (private parts)   with your normal soap.  6.  Wash thoroughly, paying special attention to the area where your surgery will be performed.  7.  Thoroughly rinse your body with warm water from the neck down.  8.  DO NOT shower/wash with your normal soap after using and rinsing off   the CHG Soap.  9.  Pat yourself dry with a clean towel.            10.  Wear clean pajamas.            11.  Place clean sheets on your bed the night of your first shower and do not sleep with pets.  Day of Surgery  Do not apply any lotions/deodorants the morning of surgery.  Please wear clean clothes to the hospital/surgery center.    Please read over the following fact sheets that you were given. Pain Booklet, Coughing and Deep Breathing, Blood Transfusion Information, MRSA Information and Surgical Site Infection Prevention

## 2015-05-03 ENCOUNTER — Other Ambulatory Visit (HOSPITAL_COMMUNITY): Payer: Medicare HMO

## 2015-05-03 NOTE — Progress Notes (Signed)
Anesthesia Chart Review:  Pt is 64 year old male scheduled for resection of R anterior chest wall mass with reconstruction 05/05/2015.   PCP is Bing Matter, [place]. Cardiologist is Dr. Irish Lack.   PMH includes: CAD (stent to OM 2000), HTN, hyperlipidemia. Current smoker. BMI 37. S/p irrigation and debridement LUE 11/22/11.  Medications include: ASA, symbicort, doxazosin, lasix, iron, lovastatin, potassium.   Preoperative labs reviewed.     Chest x-ray 05/02/2015 reviewed. R anterior lower rib mass.   EKG 04/18/2015: NSR.   Echo 04/28/2015: - Left ventricle: The cavity size was normal. Wall thickness was normal. Systolic function was normal. The estimated ejection fraction was in the range of 55% to 60%. Although no diagnostic regional wall motion abnormality was identified, this possibility cannot be completely excluded on the basis of this study. Doppler parameters are consistent with abnormal left ventricular relaxation (grade 1 diastolic dysfunction). - Aortic valve: There was no stenosis. - Aorta: Borderline dilated aortic root. Aortic root dimension: 70mm (ED). - Mitral valve: There was no significant regurgitation. - Right ventricle: The cavity size was normal. Systolic function was normal. - Pulmonary arteries: No complete TR doppler jet so unable to estimate PA systolic pressure. - Inferior vena cava: The vessel was normal in size. The respirophasic diameter changes were in the normal range (= 50%), consistent with normal central venous pressure. Impressions: Normal LV size with EF 55-60%. Normal RV size and systolicfunction. No significant valvular abnormalities.  Cardiac cath 09/01/2009: 1. Nonobstructive CAD 2. Normal LV function  If no changes, I anticipate pt can proceed with surgery as scheduled.   Willeen Cass, FNP-BC [facility]/Anesthesiology Phone: [phone] 05/03/2015 2:06 PM

## 2015-05-03 NOTE — Progress Notes (Signed)
Dana from the blood bank requested a repeat type and screen on the DOS.

## 2015-05-04 MED ORDER — VANCOMYCIN HCL 10 G IV SOLR
1500.0000 mg | INTRAVENOUS | Status: AC
  Administered 2015-05-05: 1500 mg via INTRAVENOUS
  Filled 2015-05-04: qty 1500

## 2015-05-05 ENCOUNTER — Encounter (HOSPITAL_COMMUNITY): Payer: Self-pay | Admitting: *Deleted

## 2015-05-05 ENCOUNTER — Inpatient Hospital Stay (HOSPITAL_COMMUNITY): Payer: Medicare HMO | Admitting: Critical Care Medicine

## 2015-05-05 ENCOUNTER — Inpatient Hospital Stay (HOSPITAL_COMMUNITY): Payer: Medicare HMO

## 2015-05-05 ENCOUNTER — Encounter (HOSPITAL_COMMUNITY)
Admission: RE | Disposition: A | Discharge status: Rehabilitation Facility | Payer: Self-pay | Source: Ambulatory Visit | Attending: Thoracic Surgery (Cardiothoracic Vascular Surgery)

## 2015-05-05 ENCOUNTER — Inpatient Hospital Stay (HOSPITAL_COMMUNITY)
Admission: RE | Admit: 2015-05-05 | Discharge: 2015-05-31 | DRG: 495 | Disposition: A | Discharge status: Rehabilitation Facility | Payer: Medicare HMO | Source: Ambulatory Visit | Attending: Thoracic Surgery (Cardiothoracic Vascular Surgery) | Admitting: Thoracic Surgery (Cardiothoracic Vascular Surgery)

## 2015-05-05 DIAGNOSIS — Z4659 Encounter for fitting and adjustment of other gastrointestinal appliance and device: Secondary | ICD-10-CM

## 2015-05-05 DIAGNOSIS — Z880 Allergy status to penicillin: Secondary | ICD-10-CM

## 2015-05-05 DIAGNOSIS — R222 Localized swelling, mass and lump, trunk: Secondary | ICD-10-CM | POA: Diagnosis not present

## 2015-05-05 DIAGNOSIS — I9789 Other postprocedural complications and disorders of the circulatory system, not elsewhere classified: Secondary | ICD-10-CM | POA: Diagnosis not present

## 2015-05-05 DIAGNOSIS — Z7951 Long term (current) use of inhaled steroids: Secondary | ICD-10-CM

## 2015-05-05 DIAGNOSIS — J156 Pneumonia due to other aerobic Gram-negative bacteria: Secondary | ICD-10-CM | POA: Diagnosis not present

## 2015-05-05 DIAGNOSIS — N9989 Other postprocedural complications and disorders of genitourinary system: Secondary | ICD-10-CM | POA: Diagnosis not present

## 2015-05-05 DIAGNOSIS — F418 Other specified anxiety disorders: Secondary | ICD-10-CM | POA: Diagnosis present

## 2015-05-05 DIAGNOSIS — J9622 Acute and chronic respiratory failure with hypercapnia: Secondary | ICD-10-CM | POA: Diagnosis not present

## 2015-05-05 DIAGNOSIS — R5381 Other malaise: Secondary | ICD-10-CM | POA: Diagnosis present

## 2015-05-05 DIAGNOSIS — Z881 Allergy status to other antibiotic agents status: Secondary | ICD-10-CM

## 2015-05-05 DIAGNOSIS — Z0183 Encounter for blood typing: Secondary | ICD-10-CM | POA: Diagnosis not present

## 2015-05-05 DIAGNOSIS — F1120 Opioid dependence, uncomplicated: Secondary | ICD-10-CM | POA: Diagnosis present

## 2015-05-05 DIAGNOSIS — Z7982 Long term (current) use of aspirin: Secondary | ICD-10-CM | POA: Diagnosis not present

## 2015-05-05 DIAGNOSIS — R0989 Other specified symptoms and signs involving the circulatory and respiratory systems: Secondary | ICD-10-CM

## 2015-05-05 DIAGNOSIS — Z01812 Encounter for preprocedural laboratory examination: Secondary | ICD-10-CM

## 2015-05-05 DIAGNOSIS — D62 Acute posthemorrhagic anemia: Secondary | ICD-10-CM | POA: Diagnosis not present

## 2015-05-05 DIAGNOSIS — R338 Other retention of urine: Secondary | ICD-10-CM | POA: Diagnosis not present

## 2015-05-05 DIAGNOSIS — M954 Acquired deformity of chest and rib: Secondary | ICD-10-CM | POA: Diagnosis not present

## 2015-05-05 DIAGNOSIS — J40 Bronchitis, not specified as acute or chronic: Secondary | ICD-10-CM | POA: Diagnosis not present

## 2015-05-05 DIAGNOSIS — R1310 Dysphagia, unspecified: Secondary | ICD-10-CM | POA: Diagnosis not present

## 2015-05-05 DIAGNOSIS — F17210 Nicotine dependence, cigarettes, uncomplicated: Secondary | ICD-10-CM | POA: Diagnosis present

## 2015-05-05 DIAGNOSIS — E785 Hyperlipidemia, unspecified: Secondary | ICD-10-CM | POA: Diagnosis present

## 2015-05-05 DIAGNOSIS — Z6837 Body mass index (BMI) 37.0-37.9, adult: Secondary | ICD-10-CM

## 2015-05-05 DIAGNOSIS — G894 Chronic pain syndrome: Secondary | ICD-10-CM | POA: Diagnosis present

## 2015-05-05 DIAGNOSIS — Z01818 Encounter for other preprocedural examination: Secondary | ICD-10-CM

## 2015-05-05 DIAGNOSIS — J942 Hemothorax: Secondary | ICD-10-CM | POA: Diagnosis not present

## 2015-05-05 DIAGNOSIS — Z7409 Other reduced mobility: Secondary | ICD-10-CM | POA: Diagnosis present

## 2015-05-05 DIAGNOSIS — K5903 Drug induced constipation: Secondary | ICD-10-CM | POA: Diagnosis present

## 2015-05-05 DIAGNOSIS — Y95 Nosocomial condition: Secondary | ICD-10-CM | POA: Diagnosis not present

## 2015-05-05 DIAGNOSIS — F411 Generalized anxiety disorder: Secondary | ICD-10-CM | POA: Diagnosis not present

## 2015-05-05 DIAGNOSIS — Z955 Presence of coronary angioplasty implant and graft: Secondary | ICD-10-CM | POA: Diagnosis not present

## 2015-05-05 DIAGNOSIS — Z978 Presence of other specified devices: Secondary | ICD-10-CM | POA: Diagnosis not present

## 2015-05-05 DIAGNOSIS — G8918 Other acute postprocedural pain: Secondary | ICD-10-CM | POA: Diagnosis present

## 2015-05-05 DIAGNOSIS — E876 Hypokalemia: Secondary | ICD-10-CM | POA: Diagnosis present

## 2015-05-05 DIAGNOSIS — T8119XA Other postprocedural shock, initial encounter: Secondary | ICD-10-CM | POA: Diagnosis not present

## 2015-05-05 DIAGNOSIS — T402X5A Adverse effect of other opioids, initial encounter: Secondary | ICD-10-CM | POA: Diagnosis present

## 2015-05-05 DIAGNOSIS — I10 Essential (primary) hypertension: Secondary | ICD-10-CM | POA: Diagnosis present

## 2015-05-05 DIAGNOSIS — Z885 Allergy status to narcotic agent status: Secondary | ICD-10-CM | POA: Diagnosis not present

## 2015-05-05 DIAGNOSIS — E669 Obesity, unspecified: Secondary | ICD-10-CM | POA: Diagnosis present

## 2015-05-05 DIAGNOSIS — M96841 Postprocedural hematoma of a musculoskeletal structure following other procedure: Secondary | ICD-10-CM | POA: Diagnosis not present

## 2015-05-05 DIAGNOSIS — I2510 Atherosclerotic heart disease of native coronary artery without angina pectoris: Secondary | ICD-10-CM | POA: Diagnosis present

## 2015-05-05 DIAGNOSIS — I97638 Postprocedural hematoma of a circulatory system organ or structure following other circulatory system procedure: Secondary | ICD-10-CM | POA: Diagnosis not present

## 2015-05-05 DIAGNOSIS — R609 Edema, unspecified: Secondary | ICD-10-CM | POA: Diagnosis not present

## 2015-05-05 DIAGNOSIS — M961 Postlaminectomy syndrome, not elsewhere classified: Secondary | ICD-10-CM | POA: Diagnosis not present

## 2015-05-05 DIAGNOSIS — Z96651 Presence of right artificial knee joint: Secondary | ICD-10-CM | POA: Diagnosis present

## 2015-05-05 DIAGNOSIS — C419 Malignant neoplasm of bone and articular cartilage, unspecified: Principal | ICD-10-CM | POA: Diagnosis present

## 2015-05-05 DIAGNOSIS — E870 Hyperosmolality and hypernatremia: Secondary | ICD-10-CM | POA: Diagnosis not present

## 2015-05-05 DIAGNOSIS — Z8589 Personal history of malignant neoplasm of other organs and systems: Secondary | ICD-10-CM | POA: Diagnosis not present

## 2015-05-05 DIAGNOSIS — R0603 Acute respiratory distress: Secondary | ICD-10-CM

## 2015-05-05 DIAGNOSIS — J9811 Atelectasis: Secondary | ICD-10-CM

## 2015-05-05 DIAGNOSIS — I252 Old myocardial infarction: Secondary | ICD-10-CM

## 2015-05-05 DIAGNOSIS — Z9049 Acquired absence of other specified parts of digestive tract: Secondary | ICD-10-CM | POA: Diagnosis not present

## 2015-05-05 DIAGNOSIS — Z9689 Presence of other specified functional implants: Secondary | ICD-10-CM

## 2015-05-05 DIAGNOSIS — Z9889 Other specified postprocedural states: Secondary | ICD-10-CM

## 2015-05-05 DIAGNOSIS — R350 Frequency of micturition: Secondary | ICD-10-CM | POA: Diagnosis not present

## 2015-05-05 DIAGNOSIS — R069 Unspecified abnormalities of breathing: Secondary | ICD-10-CM

## 2015-05-05 DIAGNOSIS — J9621 Acute and chronic respiratory failure with hypoxia: Secondary | ICD-10-CM | POA: Diagnosis not present

## 2015-05-05 HISTORY — PX: CHEST WALL RECONSTRUCTION: SHX5103

## 2015-05-05 LAB — TYPE AND SCREEN
ABO/RH(D): A NEG
ANTIBODY SCREEN: POSITIVE

## 2015-05-05 LAB — POCT I-STAT 4, (NA,K, GLUC, HGB,HCT)
Glucose, Bld: 139 mg/dL — ABNORMAL HIGH (ref 65–99)
HEMATOCRIT: 35 % — AB (ref 39.0–52.0)
Hemoglobin: 11.9 g/dL — ABNORMAL LOW (ref 13.0–17.0)
Potassium: 3.3 mmol/L — ABNORMAL LOW (ref 3.5–5.1)
SODIUM: 141 mmol/L (ref 135–145)

## 2015-05-05 SURGERY — RECONSTRUCTION, MAJOR, CHEST WALL
Anesthesia: General | Site: Chest | Laterality: Right

## 2015-05-05 MED ORDER — ACETAMINOPHEN 500 MG PO TABS
1000.0000 mg | ORAL_TABLET | Freq: Four times a day (QID) | ORAL | Status: DC
  Administered 2015-05-05 – 2015-05-06 (×3): 1000 mg via ORAL
  Administered 2015-05-06: 500 mg via ORAL
  Administered 2015-05-06 – 2015-05-08 (×8): 1000 mg via ORAL
  Filled 2015-05-05 (×18): qty 2

## 2015-05-05 MED ORDER — VANCOMYCIN HCL IN DEXTROSE 1-5 GM/200ML-% IV SOLN
1000.0000 mg | Freq: Two times a day (BID) | INTRAVENOUS | Status: AC
  Administered 2015-05-05: 1000 mg via INTRAVENOUS
  Filled 2015-05-05: qty 200

## 2015-05-05 MED ORDER — KETAMINE HCL 10 MG/ML IJ SOLN
INTRAMUSCULAR | Status: DC | PRN
  Administered 2015-05-05: 50 mg via INTRAVENOUS

## 2015-05-05 MED ORDER — DOXAZOSIN MESYLATE 1 MG PO TABS
1.0000 mg | ORAL_TABLET | Freq: Every day | ORAL | Status: DC
  Administered 2015-05-06 – 2015-05-07 (×2): 1 mg via ORAL
  Filled 2015-05-05 (×4): qty 1

## 2015-05-05 MED ORDER — ONDANSETRON HCL 4 MG/2ML IJ SOLN
4.0000 mg | Freq: Four times a day (QID) | INTRAMUSCULAR | Status: DC | PRN
  Administered 2015-05-06: 4 mg via INTRAVENOUS
  Filled 2015-05-05: qty 2

## 2015-05-05 MED ORDER — SENNOSIDES-DOCUSATE SODIUM 8.6-50 MG PO TABS
1.0000 | ORAL_TABLET | Freq: Every day | ORAL | Status: DC
  Administered 2015-05-06 – 2015-05-08 (×4): 1 via ORAL
  Filled 2015-05-05 (×5): qty 1

## 2015-05-05 MED ORDER — 0.9 % SODIUM CHLORIDE (POUR BTL) OPTIME
TOPICAL | Status: DC | PRN
  Administered 2015-05-05: 2000 mL

## 2015-05-05 MED ORDER — POTASSIUM CHLORIDE 10 MEQ/50ML IV SOLN: 10.0000 meq | Freq: Every day | INTRAVENOUS | Status: DC | PRN

## 2015-05-05 MED ORDER — TRAZODONE HCL 150 MG PO TABS
150.0000 mg | ORAL_TABLET | Freq: Every day | ORAL | Status: DC
  Administered 2015-05-06 – 2015-05-08 (×3): 150 mg via ORAL
  Filled 2015-05-05 (×4): qty 1.5

## 2015-05-05 MED ORDER — DEXTROSE-NACL 5-0.9 % IV SOLN
INTRAVENOUS | Status: DC
  Administered 2015-05-05 – 2015-05-08 (×3): via INTRAVENOUS

## 2015-05-05 MED ORDER — CETYLPYRIDINIUM CHLORIDE 0.05 % MT LIQD
7.0000 mL | Freq: Two times a day (BID) | OROMUCOSAL | Status: DC
  Administered 2015-05-05 – 2015-05-06 (×4): 7 mL via OROMUCOSAL

## 2015-05-05 MED ORDER — BISACODYL 5 MG PO TBEC
10.0000 mg | DELAYED_RELEASE_TABLET | Freq: Every day | ORAL | Status: DC
  Administered 2015-05-05 – 2015-05-08 (×4): 10 mg via ORAL
  Filled 2015-05-05 (×4): qty 2

## 2015-05-05 MED ORDER — DEXMEDETOMIDINE HCL IN NACL 200 MCG/50ML IV SOLN
INTRAVENOUS | Status: AC
  Filled 2015-05-05: qty 50

## 2015-05-05 MED ORDER — OXYCODONE HCL 5 MG PO TABS
5.0000 mg | ORAL_TABLET | ORAL | Status: DC | PRN
  Administered 2015-05-05: 10 mg via ORAL
  Filled 2015-05-05: qty 2

## 2015-05-05 MED ORDER — ONDANSETRON HCL 4 MG/2ML IJ SOLN: 4.0000 mg | Freq: Four times a day (QID) | INTRAMUSCULAR | Status: DC | PRN

## 2015-05-05 MED ORDER — NALOXONE HCL 0.4 MG/ML IJ SOLN
0.4000 mg | INTRAMUSCULAR | Status: DC | PRN
Start: 2015-05-05 — End: 2015-05-09

## 2015-05-05 MED ORDER — PRAVASTATIN SODIUM 20 MG PO TABS
20.0000 mg | ORAL_TABLET | Freq: Every day | ORAL | Status: DC
  Administered 2015-05-05 – 2015-05-08 (×3): 20 mg via ORAL
  Filled 2015-05-05 (×5): qty 1

## 2015-05-05 MED ORDER — MIDAZOLAM HCL 2 MG/2ML IJ SOLN
INTRAMUSCULAR | Status: AC
  Filled 2015-05-05: qty 4

## 2015-05-05 MED ORDER — MIDAZOLAM HCL 5 MG/5ML IJ SOLN
INTRAMUSCULAR | Status: DC | PRN
  Administered 2015-05-05: 2 mg via INTRAVENOUS

## 2015-05-05 MED ORDER — ALBUMIN HUMAN 5 % IV SOLN
INTRAVENOUS | Status: DC | PRN
  Administered 2015-05-05 (×2): via INTRAVENOUS

## 2015-05-05 MED ORDER — FENTANYL CITRATE (PF) 100 MCG/2ML IJ SOLN
25.0000 ug | INTRAMUSCULAR | Status: DC | PRN
  Administered 2015-05-05 (×2): 50 ug via INTRAVENOUS

## 2015-05-05 MED ORDER — BUDESONIDE-FORMOTEROL FUMARATE 160-4.5 MCG/ACT IN AERO
2.0000 | INHALATION_SPRAY | Freq: Every day | RESPIRATORY_TRACT | Status: DC
  Administered 2015-05-06 – 2015-05-08 (×2): 2 via RESPIRATORY_TRACT
  Filled 2015-05-05: qty 6

## 2015-05-05 MED ORDER — ROCURONIUM BROMIDE 50 MG/5ML IV SOLN
INTRAVENOUS | Status: AC
  Filled 2015-05-05: qty 1

## 2015-05-05 MED ORDER — LIDOCAINE HCL (CARDIAC) 20 MG/ML IV SOLN
INTRAVENOUS | Status: DC | PRN
  Administered 2015-05-05: 40 mg via INTRAVENOUS

## 2015-05-05 MED ORDER — PROPOFOL 10 MG/ML IV BOLUS
INTRAVENOUS | Status: AC
  Filled 2015-05-05: qty 20

## 2015-05-05 MED ORDER — DEXMEDETOMIDINE HCL IN NACL 200 MCG/50ML IV SOLN
INTRAVENOUS | Status: DC | PRN
  Administered 2015-05-05: .5 ug/kg/h via INTRAVENOUS
  Administered 2015-05-05: 09:00:00 via INTRAVENOUS

## 2015-05-05 MED ORDER — ROCURONIUM BROMIDE 100 MG/10ML IV SOLN
INTRAVENOUS | Status: DC | PRN
  Administered 2015-05-05: 30 mg via INTRAVENOUS
  Administered 2015-05-05: 50 mg via INTRAVENOUS
  Administered 2015-05-05 (×2): 10 mg via INTRAVENOUS
  Administered 2015-05-05: 20 mg via INTRAVENOUS
  Administered 2015-05-05: 10 mg via INTRAVENOUS

## 2015-05-05 MED ORDER — DEXMEDETOMIDINE HCL 200 MCG/2ML IV SOLN
INTRAVENOUS | Status: DC | PRN
  Administered 2015-05-05: 129.7 ug via INTRAVENOUS

## 2015-05-05 MED ORDER — LACTATED RINGERS IV SOLN
INTRAVENOUS | Status: DC | PRN
  Administered 2015-05-05: 08:00:00 via INTRAVENOUS

## 2015-05-05 MED ORDER — LACTATED RINGERS IV SOLN
INTRAVENOUS | Status: DC | PRN
  Administered 2015-05-05 (×2): via INTRAVENOUS

## 2015-05-05 MED ORDER — SUGAMMADEX SODIUM 200 MG/2ML IV SOLN
INTRAVENOUS | Status: DC | PRN
  Administered 2015-05-05: 500 mg via INTRAVENOUS

## 2015-05-05 MED ORDER — DIPHENHYDRAMINE HCL 50 MG/ML IJ SOLN
INTRAMUSCULAR | Status: DC | PRN
  Administered 2015-05-05: 25 mg via INTRAVENOUS

## 2015-05-05 MED ORDER — SUGAMMADEX SODIUM 500 MG/5ML IV SOLN
INTRAVENOUS | Status: AC
  Filled 2015-05-05: qty 5

## 2015-05-05 MED ORDER — ACETAMINOPHEN 160 MG/5ML PO SOLN
1000.0000 mg | Freq: Four times a day (QID) | ORAL | Status: DC
Start: 2015-05-05 — End: 2015-05-09

## 2015-05-05 MED ORDER — DIPHENHYDRAMINE HCL 50 MG/ML IJ SOLN
12.5000 mg | Freq: Four times a day (QID) | INTRAMUSCULAR | Status: DC | PRN
Start: 2015-05-05 — End: 2015-05-09

## 2015-05-05 MED ORDER — NICOTINE 21 MG/24HR TD PT24
21.0000 mg | MEDICATED_PATCH | Freq: Every day | TRANSDERMAL | Status: DC
  Administered 2015-05-07: 21 mg via TRANSDERMAL
  Filled 2015-05-05 (×5): qty 1

## 2015-05-05 MED ORDER — ARTIFICIAL TEARS OP OINT
TOPICAL_OINTMENT | OPHTHALMIC | Status: DC | PRN
  Administered 2015-05-05: 1 via OPHTHALMIC

## 2015-05-05 MED ORDER — FENTANYL 10 MCG/ML IV SOLN
INTRAVENOUS | Status: DC
  Administered 2015-05-05: 315 ug via INTRAVENOUS
  Administered 2015-05-05: 20:00:00 via INTRAVENOUS
  Administered 2015-05-05: 200 ug via INTRAVENOUS
  Administered 2015-05-05: 12:00:00 via INTRAVENOUS
  Administered 2015-05-06: 15 ug via INTRAVENOUS
  Administered 2015-05-06: 255 ug via INTRAVENOUS
  Administered 2015-05-06: 195 ug via INTRAVENOUS
  Administered 2015-05-06: 11:00:00 via INTRAVENOUS
  Administered 2015-05-06: 15 ug via INTRAVENOUS
  Administered 2015-05-06: 20:00:00 via INTRAVENOUS
  Administered 2015-05-06: 193 ug via INTRAVENOUS
  Administered 2015-05-06: 255 ug via INTRAVENOUS
  Administered 2015-05-06: 315 ug via INTRAVENOUS
  Administered 2015-05-07: 284.9 ug via INTRAVENOUS
  Administered 2015-05-07: 70 ug via INTRAVENOUS
  Administered 2015-05-07: 6 ug via INTRAVENOUS
  Administered 2015-05-07: 90 ug via INTRAVENOUS
  Administered 2015-05-07: 148.9 ug via INTRAVENOUS
  Administered 2015-05-07: 240 ug via INTRAVENOUS
  Administered 2015-05-07 – 2015-05-08 (×2): via INTRAVENOUS
  Administered 2015-05-08: 90 ug via INTRAVENOUS
  Administered 2015-05-08: 195 ug via INTRAVENOUS
  Administered 2015-05-08: 13:00:00 via INTRAVENOUS
  Administered 2015-05-08: 90 ug via INTRAVENOUS
  Administered 2015-05-08: 30 ug via INTRAVENOUS
  Administered 2015-05-08: 240 ug via INTRAVENOUS
  Administered 2015-05-08: 60 ug via INTRAVENOUS
  Administered 2015-05-09: 195 ug via INTRAVENOUS
  Administered 2015-05-09: 205 ug via INTRAVENOUS
  Filled 2015-05-05 (×10): qty 50

## 2015-05-05 MED ORDER — KETAMINE HCL 100 MG/ML IJ SOLN
INTRAMUSCULAR | Status: AC
  Filled 2015-05-05: qty 1

## 2015-05-05 MED ORDER — OXYCODONE HCL 5 MG PO TABS
5.0000 mg | ORAL_TABLET | ORAL | Status: DC | PRN
  Administered 2015-05-05 – 2015-05-07 (×8): 15 mg via ORAL
  Administered 2015-05-07: 10 mg via ORAL
  Administered 2015-05-07: 5 mg via ORAL
  Administered 2015-05-08: 15 mg via ORAL
  Filled 2015-05-05 (×8): qty 3
  Filled 2015-05-05: qty 2
  Filled 2015-05-05: qty 3
  Filled 2015-05-05: qty 1

## 2015-05-05 MED ORDER — PANTOPRAZOLE SODIUM 40 MG PO TBEC
40.0000 mg | DELAYED_RELEASE_TABLET | Freq: Every day | ORAL | Status: DC
  Administered 2015-05-05 – 2015-05-08 (×4): 40 mg via ORAL
  Filled 2015-05-05 (×3): qty 1

## 2015-05-05 MED ORDER — SODIUM CHLORIDE 0.9 % IJ SOLN: 9.0000 mL | INTRAMUSCULAR | Status: DC | PRN

## 2015-05-05 MED ORDER — FENTANYL CITRATE (PF) 100 MCG/2ML IJ SOLN
INTRAMUSCULAR | Status: AC
  Filled 2015-05-05: qty 2

## 2015-05-05 MED ORDER — DIPHENHYDRAMINE HCL 12.5 MG/5ML PO ELIX
12.5000 mg | ORAL_SOLUTION | Freq: Four times a day (QID) | ORAL | Status: DC | PRN
  Filled 2015-05-05: qty 5

## 2015-05-05 MED ORDER — ONDANSETRON HCL 4 MG/2ML IJ SOLN: 4.0000 mg | Freq: Once | INTRAMUSCULAR | Status: DC | PRN

## 2015-05-05 MED ORDER — LUNG SURGERY BOOK
Freq: Once | Status: AC
  Administered 2015-05-05: 1
  Filled 2015-05-05: qty 1

## 2015-05-05 MED ORDER — PHENYLEPHRINE HCL 10 MG/ML IJ SOLN
10.0000 mg | INTRAMUSCULAR | Status: DC | PRN
  Administered 2015-05-05: 10 ug/min via INTRAVENOUS

## 2015-05-05 MED ORDER — FENTANYL CITRATE (PF) 100 MCG/2ML IJ SOLN
INTRAMUSCULAR | Status: DC | PRN
  Administered 2015-05-05 (×2): 50 ug via INTRAVENOUS
  Administered 2015-05-05 (×3): 100 ug via INTRAVENOUS
  Administered 2015-05-05 (×2): 50 ug via INTRAVENOUS

## 2015-05-05 MED ORDER — FENTANYL CITRATE (PF) 250 MCG/5ML IJ SOLN
INTRAMUSCULAR | Status: AC
  Filled 2015-05-05: qty 5

## 2015-05-05 MED ORDER — ONDANSETRON HCL 4 MG/2ML IJ SOLN
INTRAMUSCULAR | Status: DC | PRN
  Administered 2015-05-05: 4 mg via INTRAVENOUS

## 2015-05-05 MED ORDER — PROPOFOL 10 MG/ML IV BOLUS
INTRAVENOUS | Status: DC | PRN
Start: 2015-05-05 — End: 2015-05-05
  Administered 2015-05-05: 170 mg via INTRAVENOUS

## 2015-05-05 MED ORDER — DIPHENHYDRAMINE HCL 50 MG/ML IJ SOLN
INTRAMUSCULAR | Status: AC
  Filled 2015-05-05: qty 1

## 2015-05-05 MED ORDER — SUCCINYLCHOLINE CHLORIDE 20 MG/ML IJ SOLN
INTRAMUSCULAR | Status: AC
  Filled 2015-05-05: qty 1

## 2015-05-05 SURGICAL SUPPLY — 59 items
ADH SKN CLS APL DERMABOND .7 (GAUZE/BANDAGES/DRESSINGS) ×1
BOWL SMART MIX CTS (DISPOSABLE) ×2 IMPLANT
CANISTER SUCTION 2500CC (MISCELLANEOUS) ×3 IMPLANT
CEMENT BONE SIMPLEX SPEEDSET (Cement) ×2 IMPLANT
CLIP TI MEDIUM 24 (CLIP) ×2 IMPLANT
CLIP TI WIDE RED SMALL 24 (CLIP) ×2 IMPLANT
CONT SPEC 4OZ CLIKSEAL STRL BL (MISCELLANEOUS) ×6 IMPLANT
DECANTER SPIKE VIAL GLASS SM (MISCELLANEOUS) ×3 IMPLANT
DERMABOND ADVANCED (GAUZE/BANDAGES/DRESSINGS) ×2
DERMABOND ADVANCED .7 DNX12 (GAUZE/BANDAGES/DRESSINGS) ×1 IMPLANT
DRAIN CHANNEL 28F RND 3/8 FF (WOUND CARE) ×2 IMPLANT
DRAPE LAPAROSCOPIC ABDOMINAL (DRAPES) ×3 IMPLANT
ELECT REM PT RETURN 9FT ADLT (ELECTROSURGICAL) ×3
ELECTRODE REM PT RTRN 9FT ADLT (ELECTROSURGICAL) ×1 IMPLANT
GAUZE SPONGE 4X4 12PLY STRL (GAUZE/BANDAGES/DRESSINGS) ×3 IMPLANT
GLOVE BIO SURGEON STRL SZ 6.5 (GLOVE) ×1 IMPLANT
GLOVE BIO SURGEON STRL SZ7.5 (GLOVE) ×2 IMPLANT
GLOVE BIO SURGEONS STRL SZ 6.5 (GLOVE) ×1
GLOVE BIOGEL PI IND STRL 6.5 (GLOVE) IMPLANT
GLOVE BIOGEL PI IND STRL 7.0 (GLOVE) IMPLANT
GLOVE BIOGEL PI IND STRL 7.5 (GLOVE) IMPLANT
GLOVE BIOGEL PI INDICATOR 6.5 (GLOVE) ×8
GLOVE BIOGEL PI INDICATOR 7.0 (GLOVE) ×2
GLOVE BIOGEL PI INDICATOR 7.5 (GLOVE) ×2
GLOVE ECLIPSE 6.5 STRL STRAW (GLOVE) ×6 IMPLANT
GLOVE SURG SIGNA 7.5 PF LTX (GLOVE) ×3 IMPLANT
GOWN STRL REUS W/ TWL LRG LVL3 (GOWN DISPOSABLE) ×2 IMPLANT
GOWN STRL REUS W/TWL LRG LVL3 (GOWN DISPOSABLE) ×12
HEMOSTAT POWDER SURGIFOAM 1G (HEMOSTASIS) IMPLANT
KIT BASIN OR (CUSTOM PROCEDURE TRAY) ×3 IMPLANT
KIT ROOM TURNOVER OR (KITS) ×3 IMPLANT
MARKER SKIN DUAL TIP RULER LAB (MISCELLANEOUS) ×2 IMPLANT
MESH BARD SOFT 6X6IN (Mesh General) ×2 IMPLANT
MESH HERNIA 6X6 BARD (Mesh General) IMPLANT
MESH HERNIA BARD 6X6 (Mesh General) ×2 IMPLANT
NS IRRIG 1000ML POUR BTL (IV SOLUTION) ×6 IMPLANT
PACK CHEST (CUSTOM PROCEDURE TRAY) ×3 IMPLANT
PAD ARMBOARD 7.5X6 YLW CONV (MISCELLANEOUS) ×6 IMPLANT
SPONGE GAUZE 4X4 12PLY STER LF (GAUZE/BANDAGES/DRESSINGS) ×2 IMPLANT
SPONGE LAP 18X18 X RAY DECT (DISPOSABLE) ×6 IMPLANT
SPONGE TONSIL 1 RF SGL (DISPOSABLE) ×2 IMPLANT
SUT PROLENE 1 CT (SUTURE) ×28 IMPLANT
SUT SILK 1 TIES 10X30 (SUTURE) ×2 IMPLANT
SUT SILK 2 0 TIES 10X30 (SUTURE) ×2 IMPLANT
SUT SILK 2 0SH CR/8 30 (SUTURE) ×3 IMPLANT
SUT VIC AB 1 CTX 18 (SUTURE) ×3 IMPLANT
SUT VIC AB 1 CTX 36 (SUTURE) ×6
SUT VIC AB 1 CTX36XBRD ANBCTR (SUTURE) IMPLANT
SUT VIC AB 2-0 CTX 27 (SUTURE) ×2 IMPLANT
SUT VIC AB 2-0 CTX 36 (SUTURE) ×3 IMPLANT
SUT VIC AB 3-0 X1 27 (SUTURE) ×3 IMPLANT
SUT VICRYL 2 TP 1 (SUTURE) ×3 IMPLANT
SYRINGE 10CC LL (SYRINGE) ×2 IMPLANT
SYSTEM SAHARA CHEST DRAIN RE-I (WOUND CARE) ×3 IMPLANT
TAPE CLOTH 4X10 WHT NS (GAUZE/BANDAGES/DRESSINGS) ×3 IMPLANT
TAPE CLOTH SURG 4X10 WHT LF (GAUZE/BANDAGES/DRESSINGS) ×2 IMPLANT
TOWEL OR 17X26 10 PK STRL BLUE (TOWEL DISPOSABLE) ×4 IMPLANT
TRAY FOLEY W/METER SILVER 16FR (SET/KITS/TRAYS/PACK) ×2 IMPLANT
WATER STERILE IRR 1000ML POUR (IV SOLUTION) ×6 IMPLANT

## 2015-05-05 NOTE — Brief Op Note (Addendum)
05/05/2015      [address]       [place] [address]             [phone]     05/05/2015  11:41 AM  PATIENT:  Jerry [name]  64 y.o. male  PRE-OPERATIVE DIAGNOSIS:  RIGHT CHEST WALL MASS  POST-OPERATIVE DIAGNOSIS:  RIGHT CHEST WALL MASS  PROCEDURE:  Procedure(s): RESECTION RIGHT ANTERIOR CHEST WALL MASS WITH RECONSTRUCTION USING POLYPROPYLENE MESH/ METHYLMETHACRYLATE PROSTHESIS  SURGEON:  Surgeon(s): [name], MD  PHYSICIAN ASSISTANT: WAYNE GOLD PA-C  ANESTHESIA:   general  SPECIMEN:  Source of Specimen:  RIGHT CHEST WALL  DISPOSITION OF SPECIMEN:  Pathology  DRAINS: (1) Blake drain(s) in the RIGHT HEMITHORAX   PATIENT CONDITION:  PACU - hemodynamically stable.  PRE-OPERATIVE WEIGHT: 333LK  COMPLICATIONS: NO KNOWN  FINDINGS: 5 cm mass in anterior 4th rib, resected with 4-5 cm gross margin.

## 2015-05-05 NOTE — Progress Notes (Signed)
Utilization Review Completed.Donne Anon T9/29/2016
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
## 2015-05-05 NOTE — Interval H&P Note (Signed)
History and Physical Interval Note:  05/05/2015 7:41 AM  Jerry [name]  has presented today for surgery, with the diagnosis of RIGHT CHEST WALL MASS  The various methods of treatment have been discussed with the patient and family. After consideration of risks, benefits and other options for treatment, the patient has consented to  Procedure(s): RESECTION RIGHT ANTERIOR CHEST WALL MASS WITH RECONSTRUCTION (Right) as a surgical intervention .  The patient's history has been reviewed, patient examined, no change in status, stable for surgery.  I have reviewed the patient's chart and labs.  Questions were answered to the patient's satisfaction.     [name]

## 2015-05-05 NOTE — Anesthesia Postprocedure Evaluation (Signed)
  Anesthesia Post-op Note  Patient: Jerry [name]  Procedure(s) Performed: Procedure(s): RESECTION RIGHT ANTERIOR CHEST WALL MASS WITH RECONSTRUCTION USING BARD MESH (Right)  Patient Location: PACU  Anesthesia Type:General  Level of Consciousness: awake, alert  and oriented  Airway and Oxygen Therapy: Patient Spontanous Breathing and Patient connected to nasal cannula oxygen  Post-op Pain: mild  Post-op Assessment: Post-op Vital signs reviewed, Patient's Cardiovascular Status Stable, Respiratory Function Stable, Patent Airway and Pain level controlled              Post-op Vital Signs: stable  Last Vitals:  Filed Vitals:   05/05/15 1549  BP:   Pulse:   Temp: 36.7 C  Resp:     Complications: No apparent anesthesia complications

## 2015-05-05 NOTE — Anesthesia Procedure Notes (Signed)
Procedure Name: Intubation Date/Time: 05/05/2015 8:05 AM Performed by: Rogers Blocker Pre-anesthesia Checklist: Patient identified, Timeout performed, Emergency Drugs available, Suction available and Patient being monitored Patient Re-evaluated:Patient Re-evaluated prior to inductionOxygen Delivery Method: Circle system utilized Preoxygenation: Pre-oxygenation with 100% oxygen Intubation Type: IV induction Ventilation: Oral airway inserted - appropriate to patient size and Mask ventilation without difficulty Laryngoscope Size: Mac and 4 Grade View: Grade I Tube type: Oral Endobronchial tube: Double lumen EBT and 39 Fr Number of attempts: 1 Airway Equipment and Method: Stylet (Placement verification with Fiberoptic bronchoscope) Placement Confirmation: ETT inserted through vocal cords under direct vision,  positive ETCO2,  CO2 detector and breath sounds checked- equal and bilateral Secured at: 23 cm Tube secured with: Tape Dental Injury: Teeth and Oropharynx as per pre-operative assessment

## 2015-05-05 NOTE — Progress Notes (Signed)
Patient ID: Jerry [name], male   DOB: Jun 08, 1951, 64 y.o.   MRN: [id number] EVENING ROUNDS NOTE :     [address]       [place] [address]             [phone]                 Day of Surgery Procedure(s) (LRB): RESECTION RIGHT ANTERIOR CHEST WALL MASS WITH RECONSTRUCTION USING BARD MESH (Right)  Total Length of Stay:  LOS: 0 days  BP 127/57 mmHg  Pulse 67  Temp(Src) 99.1 F (37.3 C) (Oral)  Resp 16  Ht 6' (1.829 m)  Wt 297 lb 1.6 oz (134.764 kg)  BMI 40.29 kg/m2  SpO2 94%  .Intake/Output      09/29 0701 - 09/30 0700   P.O. 390   I.V. (mL/kg) 2177.1 (16.2)   IV Piggyback 500   Total Intake(mL/kg) 3067.1 (22.8)   Urine (mL/kg/hr) 1115 (0.6)   Blood 500 (0.3)   Chest Tube 350 (0.2)   Total Output 1965   Net +1102.1         . dextrose 5 % and 0.9% NaCl 125 mL/hr at 05/05/15 1800     Lab Results  Component Value Date   WBC 6.4 05/02/2015   HGB 11.9* 05/05/2015   HCT 35.0* 05/05/2015   PLT 154 05/02/2015   GLUCOSE 139* 05/05/2015   CHOL  09/01/2009    130        ATP III CLASSIFICATION:  <200     mg/dL   Desirable  200-239  mg/dL   Borderline High  >=240    mg/dL   High          TRIG 89 09/01/2009   HDL 49 09/01/2009   LDLCALC  09/01/2009    63        Total Cholesterol/HDL:CHD Risk Coronary Heart Disease Risk Table                     Men   Women  1/2 Average Risk   3.4   3.3  Average Risk       5.0   4.4  2 X Average Risk   9.6   7.1  3 X Average Risk  23.4   11.0        Use the calculated Patient Ratio above and the CHD Risk Table to determine the patient's CHD Risk.        ATP III CLASSIFICATION (LDL):  <100     mg/dL   Optimal  100-129  mg/dL   Near or Above                    Optimal  130-159  mg/dL   Borderline  160-189  mg/dL   High  >190     mg/dL   Very High   ALT 25 05/02/2015   AST 25 05/02/2015   NA 141 05/05/2015   K 3.3* 05/05/2015   CL 100* 05/02/2015   CREATININE 0.95 05/02/2015   BUN 8 05/02/2015   CO2 31 05/02/2015   INR 1.11 05/02/2015   Stable postop, not bleeding  [name] MD  Beeper [phone] Office [phone] 05/05/2015 8:25 PM

## 2015-05-05 NOTE — Transfer of Care (Signed)
Immediate Anesthesia Transfer of Care Note  Patient: Jerry [name]  Procedure(s) Performed: Procedure(s): RESECTION RIGHT ANTERIOR CHEST WALL MASS WITH RECONSTRUCTION USING BARD MESH (Right)  Patient Location: PACU  Anesthesia Type:General  Level of Consciousness: awake, patient cooperative and responds to stimulation  Airway & Oxygen Therapy: Patient Spontanous Breathing and Patient connected to face mask oxygen  Post-op Assessment: Report given to RN, Post -op Vital signs reviewed and stable and Patient moving all extremities X 4  Post vital signs: Reviewed and stable  Last Vitals:  Filed Vitals:   05/05/15 0546  BP: 120/58  Pulse: 62  Temp: 36.7 C  Resp: 18    Complications: No apparent anesthesia complications

## 2015-05-05 NOTE — Anesthesia Preprocedure Evaluation (Signed)
Anesthesia Evaluation  Patient identified by MRN, date of birth, ID band Patient awake    Reviewed: Allergy & Precautions, NPO status , Patient's Chart, lab work & pertinent test results  Airway Mallampati: II  TM Distance: >3 FB Neck ROM: Full    Dental  (+) Edentulous Upper, Edentulous Lower   Pulmonary Current Smoker,    + rhonchi  + decreased breath sounds      Cardiovascular hypertension,  Rhythm:Regular Rate:Normal     Neuro/Psych    GI/Hepatic   Endo/Other    Renal/GU      Musculoskeletal   Abdominal   Peds  Hematology   Anesthesia Other Findings   Reproductive/Obstetrics                             Anesthesia Physical Anesthesia Plan  ASA: III  Anesthesia Plan: General   Post-op Pain Management:    Induction: Intravenous  Airway Management Planned: Oral ETT  Additional Equipment:   Intra-op Plan:   Post-operative Plan:   Informed Consent: I have reviewed the patients History and Physical, chart, labs and discussed the procedure including the risks, benefits and alternatives for the proposed anesthesia with the patient or authorized representative who has indicated his/her understanding and acceptance.     Plan Discussed with: CRNA and Anesthesiologist  Anesthesia Plan Comments:         Anesthesia Quick Evaluation

## 2015-05-05 NOTE — Progress Notes (Signed)
Pt arrives to room 2s04 at this time.  Pt c/o "severe pain"  PCA already infusing and pt knows to push button when in pain.  Pt just received extra dose of Fentanyl per CNRA prior to transport to ICU.  No other complaints or s/s of any acute distress.  Other VSS.

## 2015-05-05 NOTE — Progress Notes (Signed)
PHARMACY COMMUNICATION  33 YOM who has chronic pain due to multiple orthopedic injuries. He takes Valium 10 mg twice daily, oxycodone 30 mg every 4-6 hours, and has an indwelling pump that gives him 12 mg of morphine, 24 micrograms of baclofen and 6 mg of bupivacaine per day.   I spoke with both the patient and his medication supplier ([facility], [phone]) to verify the dose and that his current pump has enough medication to last him till 05/24/2015. He is not due for another refill till 05/23/15. No further interventions required at the moment. Please feel free to re-consult pharmacy if needed.   Albertina Parr, PharmD., BCPS Clinical Pharmacist Pager [phone]

## 2015-05-06 ENCOUNTER — Inpatient Hospital Stay (HOSPITAL_COMMUNITY): Payer: Medicare HMO

## 2015-05-06 ENCOUNTER — Encounter (HOSPITAL_COMMUNITY): Payer: Self-pay | Admitting: Thoracic Surgery (Cardiothoracic Vascular Surgery)

## 2015-05-06 LAB — POCT I-STAT 3, ART BLOOD GAS (G3+)
Acid-Base Excess: 3 mmol/L — ABNORMAL HIGH (ref 0.0–2.0)
BICARBONATE: 29.9 meq/L — AB (ref 20.0–24.0)
O2 Saturation: 88 %
PCO2 ART: 57 mmHg — AB (ref 35.0–45.0)
PO2 ART: 60 mmHg — AB (ref 80.0–100.0)
Patient temperature: 98.2
TCO2: 32 mmol/L (ref 0–100)
pH, Arterial: 7.326 — ABNORMAL LOW (ref 7.350–7.450)

## 2015-05-06 LAB — GLUCOSE, CAPILLARY: GLUCOSE-CAPILLARY: 217 mg/dL — AB (ref 65–99)

## 2015-05-06 LAB — BLOOD GAS, ARTERIAL
ACID-BASE EXCESS: 6.1 mmol/L — AB (ref 0.0–2.0)
Bicarbonate: 31 mEq/L — ABNORMAL HIGH (ref 20.0–24.0)
DRAWN BY: 406621
FIO2: 0.36
O2 SAT: 94.1 %
PATIENT TEMPERATURE: 98.3
PO2 ART: 68.4 mmHg — AB (ref 80.0–100.0)
TCO2: 32.6 mmol/L (ref 0–100)
pCO2 arterial: 52.4 mmHg — ABNORMAL HIGH (ref 35.0–45.0)
pH, Arterial: 7.389 (ref 7.350–7.450)

## 2015-05-06 LAB — BASIC METABOLIC PANEL
ANION GAP: 5 (ref 5–15)
BUN: <5 mg/dL — ABNORMAL LOW (ref 6–20)
CALCIUM: 8.2 mg/dL — AB (ref 8.9–10.3)
CHLORIDE: 105 mmol/L (ref 101–111)
CO2: 31 mmol/L (ref 22–32)
Creatinine, Ser: 0.93 mg/dL (ref 0.61–1.24)
GFR calc non Af Amer: >60 mL/min (ref >60)
GLUCOSE: 149 mg/dL — AB (ref 65–99)
Potassium: 3.9 mmol/L (ref 3.5–5.1)
Sodium: 141 mmol/L (ref 135–145)

## 2015-05-06 LAB — CBC
HEMATOCRIT: 35.5 % — AB (ref 39.0–52.0)
HEMOGLOBIN: 11.6 g/dL — AB (ref 13.0–17.0)
MCH: 30.3 pg (ref 26.0–34.0)
MCHC: 32.7 g/dL (ref 30.0–36.0)
MCV: 92.7 fL (ref 78.0–100.0)
Platelets: 121 10*3/uL — ABNORMAL LOW (ref 150–400)
RBC: 3.83 MIL/uL — ABNORMAL LOW (ref 4.22–5.81)
RDW: 12.9 % (ref 11.5–15.5)
WBC: 10.1 10*3/uL (ref 4.0–10.5)

## 2015-05-06 MED ORDER — KETOROLAC TROMETHAMINE 30 MG/ML IJ SOLN
30.0000 mg | Freq: Four times a day (QID) | INTRAMUSCULAR | Status: DC
  Administered 2015-05-06 (×2): 30 mg via INTRAVENOUS
  Filled 2015-05-06 (×3): qty 1

## 2015-05-06 MED ORDER — FUROSEMIDE 40 MG PO TABS
40.0000 mg | ORAL_TABLET | Freq: Every day | ORAL | Status: DC
  Administered 2015-05-06 – 2015-05-07 (×2): 40 mg via ORAL
  Filled 2015-05-06 (×3): qty 1

## 2015-05-06 MED ORDER — CALCIUM CARBONATE ANTACID 500 MG PO CHEW
1000.0000 mg | CHEWABLE_TABLET | Freq: Four times a day (QID) | ORAL | Status: DC | PRN
  Filled 2015-05-06: qty 2

## 2015-05-06 MED ORDER — ALBUMIN HUMAN 5 % IV SOLN: 12.5000 g | Freq: Once | INTRAVENOUS | Status: DC

## 2015-05-06 MED ORDER — ASPIRIN EC 81 MG PO TBEC
81.0000 mg | DELAYED_RELEASE_TABLET | Freq: Every day | ORAL | Status: DC
  Administered 2015-05-06: 81 mg via ORAL
  Filled 2015-05-06: qty 1

## 2015-05-06 MED ORDER — DIAZEPAM 5 MG PO TABS
10.0000 mg | ORAL_TABLET | Freq: Two times a day (BID) | ORAL | Status: DC | PRN
  Administered 2015-05-06 – 2015-05-09 (×5): 10 mg via ORAL
  Filled 2015-05-06 (×5): qty 2

## 2015-05-06 MED ORDER — PROMETHAZINE HCL 25 MG PO TABS: 25.0000 mg | ORAL_TABLET | Freq: Four times a day (QID) | ORAL | Status: DC | PRN

## 2015-05-06 MED ORDER — ALBUMIN HUMAN 5 % IV SOLN
25.0000 g | Freq: Once | INTRAVENOUS | Status: AC
  Administered 2015-05-06: 25 g via INTRAVENOUS
  Filled 2015-05-06: qty 500

## 2015-05-06 MED ORDER — ENOXAPARIN SODIUM 40 MG/0.4ML ~~LOC~~ SOLN
40.0000 mg | Freq: Every day | SUBCUTANEOUS | Status: DC
  Administered 2015-05-06: 40 mg via SUBCUTANEOUS
  Filled 2015-05-06: qty 0.4

## 2015-05-06 MED ORDER — ALBUMIN HUMAN 5 % IV SOLN
INTRAVENOUS | Status: AC
  Administered 2015-05-06: 25 g via INTRAVENOUS
  Filled 2015-05-06: qty 250

## 2015-05-06 MED ORDER — NOREPINEPHRINE BITARTRATE 1 MG/ML IV SOLN
1.0000 ug/min | INTRAVENOUS | Status: DC
  Filled 2015-05-06: qty 4

## 2015-05-06 NOTE — Progress Notes (Signed)
ABG this AM consistent with ABG at 14:12 on 05/05/15.  Discussed with second RN. Plan to get Patient OOB and encourage IS and Cough.  Patient has had No change in mental status, is A&OX4.  MD not notified at this time.   Richardean Canal RN, BSN, CCRN

## 2015-05-06 NOTE — Progress Notes (Signed)
[facility] BRIEF SICU PROGRESS NOTE  1 Day Post-Op  S/P Procedure(s) (LRB): RESECTION RIGHT ANTERIOR CHEST WALL MASS WITH RECONSTRUCTION USING BARD MESH (Right)   Stable day w/ adequate analgesia NSR w/ stable BP O2 sats 95% on 4 L/min  Plan: Continue current plan  Rexene Alberts, MD 05/06/2015 8:03 PM

## 2015-05-06 NOTE — Progress Notes (Signed)
1 Day Post-Op Procedure(s) (LRB): RESECTION RIGHT ANTERIOR CHEST WALL MASS WITH RECONSTRUCTION USING BARD MESH (Right) Subjective: C/o incisional pain and back pain  Objective: Vital signs in last 24 hours: Temp:  [97.5 F (36.4 C)-99.1 F (37.3 C)] 98.3 F (36.8 C) (09/30 0700) Pulse Rate:  [50-67] 53 (09/30 0700) Cardiac Rhythm:  [-] Sinus bradycardia (09/30 0400) Resp:  [10-26] 17 (09/30 0800) BP: (105-134)/(50-69) 129/55 mmHg (09/30 0700) SpO2:  [91 %-100 %] 95 % (09/30 0824) Arterial Line BP: (110-171)/(39-98) 132/58 mmHg (09/30 0700) Weight:  [288 lb 6.4 oz (130.817 kg)-297 lb 1.6 oz (134.764 kg)] 288 lb 6.4 oz (130.817 kg) (09/30 0500)  Hemodynamic parameters for last 24 hours:    Intake/Output from previous day: 09/29 0701 - 09/30 0700 In: 4442.1 [P.O.:390; I.V.:3552.1; IV Piggyback:500] Out: 2940 [Urine:1840; Blood:500; Chest Tube:600] Intake/Output this shift:    General appearance: alert and no distress Neurologic: intact Heart: regular rate and rhythm Lungs: diminished breath sounds bibasilar Abdomen: normal findings: soft, non-tender CT with serosanguinous drainage, no air leak  Lab Results:  Recent Labs  05/05/15 1012 05/06/15 0450  WBC  --  10.1  HGB 11.9* 11.6*  HCT 35.0* 35.5*  PLT  --  121*   BMET:  Recent Labs  05/05/15 1012 05/06/15 0450  NA 141 141  K 3.3* 3.9  CL  --  105  CO2  --  31  GLUCOSE 139* 149*  BUN  --  <5*  CREATININE  --  0.93  CALCIUM  --  8.2*    PT/INR: No results for input(s): LABPROT, INR in the last 72 hours. ABG    Component Value Date/Time   PHART 7.326* 05/06/2015 0451   HCO3 29.9* 05/06/2015 0451   TCO2 32 05/06/2015 0451   O2SAT 88.0 05/06/2015 0451   CBG (last 3)  No results for input(s): GLUCAP in the last 72 hours.  Assessment/Plan: S/P Procedure(s) (LRB): RESECTION RIGHT ANTERIOR CHEST WALL MASS WITH RECONSTRUCTION USING BARD MESH (Right) -  CV- stable  RESP- some atelectasis on CXR,    Acute on chronic respiratory failure- hypercarbia on ABG -  IS, OOB  RENAL- creatinine and electrolytes OK  ENDO- CBG OK  PAIN- as expected pain control is an issue, he is on a fentanyl PCA and PO oxycodone, still complains of pain but can't increase narcotics due to CO2 retention- will give toradol x 24 hours.  DVT prophylaxis- SCD + enoxaparin  PT consult to assist with mobility   LOS: 1 day    [name] 05/06/2015

## 2015-05-07 ENCOUNTER — Inpatient Hospital Stay (HOSPITAL_COMMUNITY): Payer: Medicare HMO

## 2015-05-07 DIAGNOSIS — J942 Hemothorax: Secondary | ICD-10-CM

## 2015-05-07 LAB — COMPREHENSIVE METABOLIC PANEL
ALK PHOS: 35 U/L — AB (ref 38–126)
ALT: 16 U/L — ABNORMAL LOW (ref 17–63)
ANION GAP: 6 (ref 5–15)
AST: 26 U/L (ref 15–41)
Albumin: 3.2 g/dL — ABNORMAL LOW (ref 3.5–5.0)
BILIRUBIN TOTAL: 1.5 mg/dL — AB (ref 0.3–1.2)
BUN: 6 mg/dL (ref 6–20)
CALCIUM: 8.4 mg/dL — AB (ref 8.9–10.3)
CO2: 28 mmol/L (ref 22–32)
Chloride: 106 mmol/L (ref 101–111)
Creatinine, Ser: 1.2 mg/dL (ref 0.61–1.24)
GFR calc Af Amer: >60 mL/min (ref >60)
Glucose, Bld: 200 mg/dL — ABNORMAL HIGH (ref 65–99)
POTASSIUM: 4 mmol/L (ref 3.5–5.1)
Sodium: 140 mmol/L (ref 135–145)
TOTAL PROTEIN: 5.2 g/dL — AB (ref 6.5–8.1)

## 2015-05-07 LAB — CBC
HEMATOCRIT: 28.8 % — AB (ref 39.0–52.0)
HEMATOCRIT: 28.8 % — AB (ref 39.0–52.0)
HEMOGLOBIN: 9.7 g/dL — AB (ref 13.0–17.0)
Hemoglobin: 9.2 g/dL — ABNORMAL LOW (ref 13.0–17.0)
MCH: 30 pg (ref 26.0–34.0)
MCH: 30.7 pg (ref 26.0–34.0)
MCHC: 31.9 g/dL (ref 30.0–36.0)
MCHC: 33.7 g/dL (ref 30.0–36.0)
MCV: 91.1 fL (ref 78.0–100.0)
MCV: 93.8 fL (ref 78.0–100.0)
Platelets: 174 10*3/uL (ref 150–400)
Platelets: 133 10*3/uL — ABNORMAL LOW (ref 150–400)
RBC: 3.07 MIL/uL — ABNORMAL LOW (ref 4.22–5.81)
RBC: 3.16 MIL/uL — ABNORMAL LOW (ref 4.22–5.81)
RDW: 12.9 % (ref 11.5–15.5)
RDW: 13.7 % (ref 11.5–15.5)
WBC: 14 10*3/uL — ABNORMAL HIGH (ref 4.0–10.5)
WBC: 15.2 10*3/uL — ABNORMAL HIGH (ref 4.0–10.5)

## 2015-05-07 LAB — POCT I-STAT 3, ART BLOOD GAS (G3+)
Bicarbonate: 26 mEq/L — ABNORMAL HIGH (ref 20.0–24.0)
O2 SAT: 88 %
PCO2 ART: 47.4 mmHg — AB (ref 35.0–45.0)
Patient temperature: 98.3
TCO2: 27 mmol/L (ref 0–100)
pH, Arterial: 7.345 — ABNORMAL LOW (ref 7.350–7.450)
pO2, Arterial: 58 mmHg — ABNORMAL LOW (ref 80.0–100.0)

## 2015-05-07 LAB — LACTIC ACID, PLASMA
LACTIC ACID, VENOUS: 1.9 mmol/L (ref 0.5–2.0)
Lactic Acid, Venous: 2.1 mmol/L (ref 0.5–2.0)

## 2015-05-07 LAB — BASIC METABOLIC PANEL
Anion gap: 8 (ref 5–15)
BUN: <5 mg/dL — ABNORMAL LOW (ref 6–20)
CALCIUM: 8.9 mg/dL (ref 8.9–10.3)
CO2: 28 mmol/L (ref 22–32)
CREATININE: 1.27 mg/dL — AB (ref 0.61–1.24)
Chloride: 102 mmol/L (ref 101–111)
GFR calc Af Amer: >60 mL/min (ref >60)
GFR calc non Af Amer: 58 mL/min — ABNORMAL LOW (ref >60)
GLUCOSE: 312 mg/dL — AB (ref 65–99)
Potassium: 3.3 mmol/L — ABNORMAL LOW (ref 3.5–5.1)
Sodium: 138 mmol/L (ref 135–145)

## 2015-05-07 LAB — APTT: aPTT: 35 seconds (ref 24–37)

## 2015-05-07 LAB — HEMOGLOBIN AND HEMATOCRIT, BLOOD
HCT: 28.6 % — ABNORMAL LOW (ref 39.0–52.0)
HEMATOCRIT: 25.3 % — AB (ref 39.0–52.0)
HEMOGLOBIN: 9.3 g/dL — AB (ref 13.0–17.0)
Hemoglobin: 8.5 g/dL — ABNORMAL LOW (ref 13.0–17.0)

## 2015-05-07 LAB — PREPARE RBC (CROSSMATCH)

## 2015-05-07 LAB — PROTIME-INR
INR: 1.47 (ref 0.00–1.49)
Prothrombin Time: 17.9 seconds — ABNORMAL HIGH (ref 11.6–15.2)

## 2015-05-07 MED ORDER — SODIUM CHLORIDE 0.9 % IV SOLN
Freq: Once | INTRAVENOUS | Status: AC
  Administered 2015-05-07: 250 mL via INTRAVENOUS

## 2015-05-07 MED ORDER — CHLORHEXIDINE GLUCONATE 0.12 % MT SOLN
15.0000 mL | Freq: Two times a day (BID) | OROMUCOSAL | Status: DC
  Administered 2015-05-07 – 2015-05-08 (×2): 15 mL via OROMUCOSAL
  Filled 2015-05-07: qty 15

## 2015-05-07 MED ORDER — CETYLPYRIDINIUM CHLORIDE 0.05 % MT LIQD
7.0000 mL | Freq: Two times a day (BID) | OROMUCOSAL | Status: DC
  Administered 2015-05-07 – 2015-05-08 (×3): 7 mL via OROMUCOSAL

## 2015-05-07 MED ORDER — ALBUMIN HUMAN 5 % IV SOLN
INTRAVENOUS | Status: AC
  Administered 2015-05-07: 12.5 g
  Filled 2015-05-07: qty 250

## 2015-05-07 MED ORDER — ALBUMIN HUMAN 5 % IV SOLN
12.5000 g | Freq: Once | INTRAVENOUS | Status: AC
  Administered 2015-05-07: 12.5 g via INTRAVENOUS

## 2015-05-07 MED ORDER — SODIUM CHLORIDE 0.9 % IV SOLN: Freq: Once | INTRAVENOUS | Status: AC

## 2015-05-07 NOTE — Consult Note (Signed)
PCCM brief consultation note  Hx:  Jerry [name] is a 64 y/o man with recently diagnosed chrondosarcoma who is POD: #2 from a R anterior chest wall resection with chest wall reconstruction. He has a history of chronic pain and narcotic dependence. While rounding on the unit, CCRNs called for urgent assistance as patient was hypoxic and hypotensive. While speaking with primary team, request for PCCM assistance was requested.  Exam: Ashen man, MAP in 46s. Chest tube placed to suction with blood coming out of tube, but tube somewhat positional. Stripping tube can continually produce dark blood. Rhonchorus breath sounds and loud upper airway noises.  Chest xray shows marked effusion compared to prior.  U/S Exam of chest is limited, but suggestive of fluid in pleural space.  A/P: Post-operative Hemothorax with hemorrhage shock.  Recommend: - CBC, BMP, PT/PTT, lactic acid now. - Volume repletion. Recommend 2U PRBCs now, NS plus albumin reasonable. - W/u for alternative causes of shock (blood cx) - May require replacement of chest tube, possible surgical exploration.  Discussed with Dr. Roxy Manns from primary team who will be following patient tonight.  CRITICAL CARE Performed by: Luz Brazen  Total critical care time: 30 mins  Critical care time was exclusive of separately billable procedures and treating other patients.  Critical care was necessary to treat or prevent imminent or life-threatening deterioration.  Critical care was time spent personally by me on the following activities: development of treatment plan with patient and/or surrogate as well as nursing, discussions with consultants, evaluation of patient's response to treatment, examination of patient, obtaining history from patient or surrogate, ordering and performing treatments and interventions, ordering and review of laboratory studies, ordering and review of radiographic studies, pulse oximetry and re-evaluation of patient's  condition.

## 2015-05-07 NOTE — Evaluation (Signed)
Physical Therapy Evaluation Patient Details Name: Jerry [name] MRN: [id number] DOB: June 16, 1951 Today's Date: 05/07/2015   History of Present Illness  Pt is a 64 y/o male who presents with a cartilaginous chest wall mass. Pt is now s/p a right anterior chest wall resection (3rd through 5th ribs), with reconstruction with polypropylene mesh and methyl methacrylate andwich on 05/06/15.   Clinical Impression  Pt admitted with above diagnosis. Pt currently with functional limitations due to the deficits listed below (see PT Problem List). At the time of PT eval pt was able to perform transfers and minimal ambulation bed>chair with +2 assist for balance/support and equipment/lines. Pain was main limiting factor. Pt will benefit from skilled PT to increase their independence and safety with mobility to allow discharge to the venue listed below. Depending on progress with PT may need to consider a higher level of care. Will continue to assess for most appropriate d/c disposition.     Follow Up Recommendations Home health PT;Supervision/Assistance - 24 hour    Equipment Recommendations  Rolling walker with 5" wheels;3in1 (PT)    Recommendations for Other Services       Precautions / Restrictions Precautions Precautions: Fall Precaution Comments: Pain control; chest tube.  Restrictions Weight Bearing Restrictions: Yes (Decreased)      Mobility  Bed Mobility Overal bed mobility: Needs Assistance;+2 for physical assistance Bed Mobility: Supine to Sit     Supine to sit: Mod assist;+2 for physical assistance     General bed mobility comments: Encouraged roll but was more of a supine to sit transfer. +2 for trunk elevation to full sitting.   Transfers Overall transfer level: Needs assistance Equipment used: Rolling walker (2 wheeled) Transfers: Sit to/from Stand Sit to Stand: Min assist;+2 physical assistance;+2 safety/equipment         General transfer comment: Pt was able to  stand from bed with use of lung pillow and +2 for support/safety. Pt required firm cues to hold walker with both hands and let go of the pillow once standing.   Ambulation/Gait Ambulation/Gait assistance: Min assist;+2 physical assistance;+2 safety/equipment Ambulation Distance (Feet): 5 Feet Assistive device: Rolling walker (2 wheeled) Gait Pattern/deviations: Step-to pattern;Decreased stride length;Trunk flexed Gait velocity: Decreased Gait velocity interpretation: Below normal speed for age/gender General Gait Details: Pt with generally flexed posture. He was able to take a few steps to the recliner and pivot around to sit. Increased assist and cues for safety with lines and equipment.   Stairs            Wheelchair Mobility    Modified Rankin (Stroke Patients Only)       Balance Overall balance assessment: Needs assistance Sitting-balance support: Feet supported;No upper extremity supported Sitting balance-Leahy Scale: Fair     Standing balance support: Bilateral upper extremity supported;During functional activity Standing balance-Leahy Scale: Poor Standing balance comment: Requires support from therapist or UE support on walker.                              Pertinent Vitals/Pain Pain Assessment: 0-10 Pain Score: 7  Pain Location: Chest and gluteal muscles from immobility. 15/10 before mobility, 7/10 after in chair.  Pain Descriptors / Indicators: Operative site guarding;Cramping Pain Intervention(s): Limited activity within patient's tolerance;Monitored during session;Repositioned;PCA encouraged    Home Living Family/patient expects to be discharged to:: Private residence Living Arrangements: Children Available Help at Discharge: Family;Available 24 hours/day Type of Home: House Home Access: Stairs to  enter   Entrance Stairs-Number of Steps: 1 small step (threshold to front door) Home Layout: One level Home Equipment: Cane - single point       Prior Function Level of Independence: Independent with assistive device(s)               Hand Dominance   Dominant Hand: Right    Extremity/Trunk Assessment   Upper Extremity Assessment: Defer to OT evaluation           Lower Extremity Assessment: Generalized weakness      Cervical / Trunk Assessment: Normal  Communication   Communication: No difficulties  Cognition Arousal/Alertness: Awake/alert Behavior During Therapy: WFL for tasks assessed/performed Overall Cognitive Status: Within Functional Limits for tasks assessed                      General Comments      Exercises        Assessment/Plan    PT Assessment Patient needs continued PT services  PT Diagnosis Difficulty walking;Acute pain   PT Problem List Decreased strength;Decreased range of motion;Decreased balance;Decreased activity tolerance;Decreased mobility;Decreased knowledge of use of DME;Decreased safety awareness;Decreased knowledge of precautions;Cardiopulmonary status limiting activity;Pain  PT Treatment Interventions DME instruction;Gait training;Stair training;Functional mobility training;Therapeutic activities;Therapeutic exercise;Neuromuscular re-education;Patient/family education   PT Goals (Current goals can be found in the Care Plan section) Acute Rehab PT Goals Patient Stated Goal: Decrease pain PT Goal Formulation: With patient/family Time For Goal Achievement: 05/21/15 Potential to Achieve Goals: Good    Frequency Min 3X/week   Barriers to discharge        Co-evaluation               End of Session Equipment Utilized During Treatment: Gait belt;Oxygen Activity Tolerance: Patient limited by pain Patient left: in chair;with call bell/phone within reach;with chair alarm set;with family/visitor present Nurse Communication: Mobility status         Time: 8264-1583 PT Time Calculation (min) (ACUTE ONLY): 35 min   Charges:   PT Evaluation $Initial PT  Evaluation Tier I: 1 Procedure PT Treatments $Therapeutic Activity: 8-22 mins   PT G Codes:        Rolinda Roan 2015-05-14, 11:13 AM   Rolinda Roan, PT, DPT Acute Rehabilitation Services Pager: [phone]

## 2015-05-07 NOTE — Progress Notes (Signed)
TCTS BRIEF SICU PROGRESS NOTE  2 Days Post-Op  S/P Procedure(s) (LRB): RESECTION RIGHT ANTERIOR CHEST WALL MASS WITH RECONSTRUCTION USING BARD MESH (Right)   Stable day NSR w/ stable BP O2 sats 93-95% on 3 L/min UOP adequate Hgb/Hct stable  Plan: Continue current plan  Rexene Alberts, MD 05/07/2015 6:21 PM

## 2015-05-07 NOTE — Progress Notes (Addendum)
[address]       [place] [address]             [phone]        CARDIOTHORACIC SURGERY PROGRESS NOTE   R2 Days Post-Op Procedure(s) (LRB): RESECTION RIGHT ANTERIOR CHEST WALL MASS WITH RECONSTRUCTION USING BARD MESH (Right)  Subjective: Complains of pain in chest.  Denies SOB and breathing comfortably.  Wants breakfast.  Objective: Vital signs: BP Readings from Last 1 Encounters:  05/07/15 108/59   Pulse Readings from Last 1 Encounters:  05/07/15 107   Resp Readings from Last 1 Encounters:  05/07/15 19   Temp Readings from Last 1 Encounters:  05/07/15 100.7 F (38.2 C) Oral    Hemodynamics:    Physical Exam:  Rhythm:   sinus  Breath sounds: Diminished on right side  Heart sounds:  RRR  Incisions:  Clean and dry, right anterior chest swollen  Abdomen:  Soft, non-distended, non-tender  Extremities:  Warm, well-perfused  Chest tubes:  Low volume dark serosanguinous output, no air leak    Intake/Output from previous day: 09/30 0701 - 10/01 0700 In: 5703.8 [P.O.:2040; I.V.:2313.8; Blood:600; IV Piggyback:750] Out: 3115 [Urine:2755; Chest Tube:360] Intake/Output this shift: Total I/O In: 50 [I.V.:50] Out: 165 [Urine:115; Chest Tube:50]  Lab Results:  CBC: Recent Labs  05/07/15 0010 05/07/15 0359  WBC 14.0* 15.2*  HGB 9.2* 9.7*  HCT 28.8* 28.8*  PLT 174 133*    BMET:  Recent Labs  05/07/15 0010 05/07/15 0359  NA 138 140  K 3.3* 4.0  CL 102 106  CO2 28 28  GLUCOSE 312* 200*  BUN <5* 6  CREATININE 1.27* 1.20  CALCIUM 8.9 8.4*     PT/INR:   Recent Labs  05/07/15 0030  LABPROT 17.9*  INR 1.47    CBG (last 3)   Recent Labs  05/06/15 2344  GLUCAP 217*    ABG    Component Value Date/Time   PHART 7.345* 05/06/2015 2348   PCO2ART 47.4* 05/06/2015 2348   PO2ART 58.0* 05/06/2015 2348   HCO3 26.0* 05/06/2015 2348   TCO2 27 05/06/2015 2348   O2SAT 88.0 05/06/2015 2348    CXR: PORTABLE CHEST 1  VIEW  COMPARISON: 05/06/2015  FINDINGS: RIGHT central venous line to the distal SVC. Normal cardiac silhouette. There are bilateral pleural effusions greater on the RIGHT. Fusion layers along the RIGHT chest wall. No pneumothorax.  IMPRESSION: 1. No interval change. 2. Cardiomegaly, bibasilar atelectasis, and moderate RIGHT effusion.   Electronically Signed  By: Suzy Bouchard M.D.  On: 05/07/2015 07:39  Assessment/Plan: S/P Procedure(s) (LRB): RESECTION RIGHT ANTERIOR CHEST WALL MASS WITH RECONSTRUCTION USING BARD MESH (Right)  Currently stable maintaining NSR w/ stable BP and O2 sats 100% on BiPAP 40% FiO2 Episode of hypoxemia and hypotension overnight assoc w/ drop in Hgb from  11.6 yesterday morning to 9.2 c/w hypoventilation right lung and acute blood loss anemia  Hypotension resolved quickly w/ volume resuscitation and Hgb increased to 9.7 after 2 units PRBC's and 750 mL albumin - no signs of significant ongoing blood loss at present  pCXR stable this morning with no further increase in opacity right chest c/w increased RLL atelectasis w/ possible effusion and/or possible soft tissue opacity (hematoma) external to chestwall patch - only low volume old dark serosanguinous output via right pleural tube   Lovenox discontinued - SCD boots for DVT prophylaxis  Continue pulmonary toilet, BiPAP as needed  Watch Hgb and hemodynamics closely  Keep chest tube in place and watch output  Keep in SICU and consider reexploration of right chest if patient develops signs of continued blood loss    Rexene Alberts, MD 05/07/2015 8:39 AM

## 2015-05-07 NOTE — Progress Notes (Addendum)
CRITICAL VALUE ALERT  Critical value received:  Lactic 2.1  Date of notification:  05/07/2015  Time of notification:  0141  Critical value read back: yes  Nurse who received alert:  A. Haggard Therapist, sports. Notified Gevena Barre RN   MD notified (1st page):  Dr Deterding  Time of first page:  0451  MD notified (2nd page):  Time of second page:  Responding MD: Dr Jimmy Footman   Time MD responded: [phone]

## 2015-05-07 NOTE — Op Note (Signed)
NAMEDANNEL, RAFTER              ACCOUNT NO.:  [id number]  MEDICAL RECORD NO.:  [id number]  LOCATION:  2S04C                        FACILITY:  [facility]  PHYSICIAN:  Revonda Standard. Roxan Hockey, M.D.DATE OF BIRTH:  [birth year]  DATE OF PROCEDURE:  05/05/2015 DATE OF DISCHARGE:                              OPERATIVE REPORT   PREOPERATIVE DIAGNOSIS:  Cartilaginous chest wall mass.  POSTOPERATIVE DIAGNOSIS:  Cartilaginous chest wall mass.  PROCEDURE:  Right anterior chest wall resection (3rd through 5th ribs) with reconstruction with polypropylene mesh and methyl methacrylate sandwich.  SURGEON:  Revonda Standard. Roxan Hockey, MD  ASSISTANT:  John Giovanni, PA-C  ANESTHESIA:  General.  FINDINGS:  Approximately 5 cm mass centered on 4th rib anterior chest wall resected with 4-5 cm gross margins resulting in 12 x 12 cm defect repaired with polypropylene mesh and methyl methacrylate prosthesis.  CLINICAL NOTE:  Mr. Denardo is a 64 year old man with a complicated medical history. He noted a lump in his right anterior chest about 4 months ago.  This had gotten bigger over time.  A chest x-ray was done and suggested a right middle lobe mass, but on further evaluation, it was determined to be a chest wall mass.  An ultrasound guided needle biopsy was performed and this showed a cartilaginous tumor. It was unclear if this was a chondroma or a low-grade chondrosarcoma.  A CT scan was done under general anesthesia. It also suggested a chondroma versus a low-grade chondrosarcoma.  The patient was advised to undergo chest wall resection with reconstruction.  The indications, risks, benefits, and alternatives were discussed in detail with the patient.  He understood and accepted the risks and agreed to proceed.  OPERATIVE NOTE:  Mr. Loman was brought to the operating room on May 05, 2015.  Intravenous antibiotics were administered.  He was anesthetized and intubated with a double-lumen  endotracheal tube.  A Foley catheter was placed.  Sequential compression devices were placed on the calves for DVT prophylaxis.  The right chest was prepped and draped in the usual sterile fashion.  An incision was made in the inframammary crease on the right side extending from the sternum to the anterior axillary line. It was carried through the skin and subcutaneous tissue.  The pectoralis fascia was mobilized along its inferior border and dissected up over the mass. There was no invasion of the pectoralis fascia.  There was a clear fascial plane between the pectoralis major and the mass.  The mass was clearly visible in the anterior portion of the 4th rib.  Single lung ventilation of the left lung was initiated which allowed the right lung to collapse.  The 5th intercostal space was entered.  The musculature was divided with electrocautery.  Anteriorly, the confluence of the ribs and the cartilages was very calcified.  This was divided with an oscillating saw.  The 2nd interspace also was entered taking care not to injure the underlying lung.  The 5th rib then was divided laterally and retraction was placed allowing the under surface of the chest wall to be visualized.  There were adhesions of the lung from the patient's prior injury, but no invasion of the mass into the lung.  The adhesions were taken down.  The 4th and 3rd ribs were divided laterally as well.  At this point, the adhesions were taken down between the lung and chest wall as the mass was elevated.  The anterior resection margin was adjacent to the sternum, this was done with an oscillating saw.  The specimen then was removed and sent for permanent pathology.  The mammary vessels were clamped and ligated.  The resulting chest wall defect measured 12 x 12 cm.  A prosthesis was fashioned using 2 layers of polypropylene mesh with methyl methacrylate paste between them. It was molded to the shape of the chest wall and then  holes were drilled in the prosthesis.  Once it had hardened, #1 Prolene sutures were used to secure the prosthesis to the 3rd, 4th, and 5th ribs laterally, the 2nd and 6th ribs superiorly and inferiorly, and the sternum medially.  A 28-French chest tube was placed through a separate stab incision prior to placing the prosthesis.  The right lung was reinflated.  The wound was copiously irrigated with warm saline.  The pectoralis fascia was closed with a running #1 Vicryl suture.  Subcutaneous tissue was closed with running 2-0 Vicryl suture and the skin was closed with a 3-0 Vicryl subcuticular suture.  All sponge, needle, and instrument counts were correct at the end of the procedure.  The patient was taken from the operating room to the postanesthetic care unit in good condition.     Revonda Standard Roxan Hockey, M.D.     SCH/MEDQ  D:  05/06/2015  T:  05/07/2015  Job:  [id number]

## 2015-05-07 NOTE — Progress Notes (Signed)
At midnight Pt c/o 10/10 surgical pain and unable to cough effectively.  NTS'd with no secretions obtained after two attempts.  Pt became ashen with sonorous breath sounds bilaterally and became hypotensive with SBP 70s.  Right CT to water seal drained 50 cc blood  at 2200 hrs  and 100 cc at midnight.  RIght chest and upper abd firm.  CCM summoned after tending to another pt on unit to evaluate this pt.  Dr Ancil Linsey at bedside  ordered stat [facility] showeing increased effusion on right, Stat ABG with PH 7.34  PCO2 47,  PO2 47.  Placed on BIPAP at 100% per Dr Ancil Linsey.  Stat CBC, BMP, PT/PTT Lactic Acid and blood cultures times 2 drawn.  NSS 1 liter, ALbumin 12.5 x 3 units given . At 0100 SBP 70s and HGb drop from 11.6 to 9.2  called to Dr Roxy Manns who ordered tranfusion of 2 units PRBCs  And CT placed to -20 cm suction 0130: VS  93/46 86 SR 22  100%

## 2015-05-08 ENCOUNTER — Inpatient Hospital Stay (HOSPITAL_COMMUNITY): Payer: Medicare HMO

## 2015-05-08 LAB — BASIC METABOLIC PANEL
Anion gap: 4 — ABNORMAL LOW (ref 5–15)
BUN: 8 mg/dL (ref 6–20)
CHLORIDE: 100 mmol/L — AB (ref 101–111)
CO2: 32 mmol/L (ref 22–32)
CREATININE: 1.14 mg/dL (ref 0.61–1.24)
Calcium: 8 mg/dL — ABNORMAL LOW (ref 8.9–10.3)
Glucose, Bld: 149 mg/dL — ABNORMAL HIGH (ref 65–99)
Potassium: 4 mmol/L (ref 3.5–5.1)
SODIUM: 136 mmol/L (ref 135–145)

## 2015-05-08 LAB — HEMOGLOBIN AND HEMATOCRIT, BLOOD
HCT: 21.5 % — ABNORMAL LOW (ref 39.0–52.0)
HEMATOCRIT: 27.9 % — AB (ref 39.0–52.0)
HEMOGLOBIN: 9.3 g/dL — AB (ref 13.0–17.0)
HEMOGLOBIN: 7.2 g/dL — AB (ref 13.0–17.0)

## 2015-05-08 LAB — CBC
HCT: 22.3 % — ABNORMAL LOW (ref 39.0–52.0)
Hemoglobin: 7.4 g/dL — ABNORMAL LOW (ref 13.0–17.0)
MCH: 30.1 pg (ref 26.0–34.0)
MCHC: 33.2 g/dL (ref 30.0–36.0)
MCV: 90.7 fL (ref 78.0–100.0)
PLATELETS: 103 10*3/uL — AB (ref 150–400)
RBC: 2.46 MIL/uL — AB (ref 4.22–5.81)
RDW: 14 % (ref 11.5–15.5)
WBC: 9.1 10*3/uL (ref 4.0–10.5)

## 2015-05-08 LAB — POCT I-STAT, CHEM 8
BUN: 4 mg/dL — ABNORMAL LOW (ref 6–20)
CALCIUM ION: 0.98 mmol/L — AB (ref 1.13–1.30)
CREATININE: 0.6 mg/dL — AB (ref 0.61–1.24)
Chloride: 104 mmol/L (ref 101–111)
GLUCOSE: 111 mg/dL — AB (ref 65–99)
HCT: 22 % — ABNORMAL LOW (ref 39.0–52.0)
HEMOGLOBIN: 7.5 g/dL — AB (ref 13.0–17.0)
Potassium: 2.9 mmol/L — ABNORMAL LOW (ref 3.5–5.1)
Sodium: 145 mmol/L (ref 135–145)
TCO2: 24 mmol/L (ref 0–100)

## 2015-05-08 LAB — PREPARE RBC (CROSSMATCH)

## 2015-05-08 MED ORDER — SODIUM CHLORIDE 0.9 % IV SOLN
Freq: Once | INTRAVENOUS | Status: AC
  Administered 2015-05-08 (×2): via INTRAVENOUS

## 2015-05-08 MED ORDER — POTASSIUM CHLORIDE 10 MEQ/50ML IV SOLN
10.0000 meq | INTRAVENOUS | Status: AC
  Administered 2015-05-08 (×3): 10 meq via INTRAVENOUS
  Filled 2015-05-08: qty 50

## 2015-05-08 MED ORDER — SODIUM CHLORIDE 0.9 % IV SOLN: Freq: Once | INTRAVENOUS | Status: DC

## 2015-05-08 MED ORDER — FUROSEMIDE 10 MG/ML IJ SOLN
40.0000 mg | Freq: Once | INTRAMUSCULAR | Status: AC
  Administered 2015-05-08: 40 mg via INTRAVENOUS
  Filled 2015-05-08: qty 4

## 2015-05-08 NOTE — Progress Notes (Signed)
Notified Dr. Roxy Manns of Hgb 7.4 (down from 9.7) this morning. SBP in the 90s. Order received to give 2 units PRBC. Will continue to closely monitor. Richarda Blade RN

## 2015-05-08 NOTE — Progress Notes (Signed)
Updated patient's son Scooter at Mr. Sweetland request. Updated on need for blood transfusion, potassium, and current vital signs. Mr. Mesta spoke on phone with Scooter. Will continue to closely monitor patient and update family as needed. Richarda Blade RN

## 2015-05-08 NOTE — Progress Notes (Signed)
[address]       [place] [address]             [phone]        CARDIOTHORACIC SURGERY PROGRESS NOTE   R3 Days Post-Op Procedure(s) (LRB): RESECTION RIGHT ANTERIOR CHEST WALL MASS WITH RECONSTRUCTION USING BARD MESH (Right)  Subjective: Reports feeling better.  Pain improved.  Slept some last night.  Objective: Vital signs: BP Readings from Last 1 Encounters:  05/08/15 129/56   Pulse Readings from Last 1 Encounters:  05/08/15 111   Resp Readings from Last 1 Encounters:  05/08/15 20   Temp Readings from Last 1 Encounters:  05/08/15 98.4 F (36.9 C) Axillary    Hemodynamics:    Physical Exam:  Rhythm:   sinus  Breath sounds: Diminished on right side  Heart sounds:  RRR  Incisions:  Clean and dry - no change in appearance of right chest   Abdomen:  Soft, non-distended, non-tender  Extremities:  Warm, well-perfused, swollen   Intake/Output from previous day: 10/01 0701 - 10/02 0700 In: 1962 [P.O.:360; I.V.:1043; Blood:268] Out: 2297 [Urine:1215; Chest Tube:180] Intake/Output this shift: Total I/O In: 187.5 [I.V.:157.5; Blood:30] Out: 250 [Urine:220; Chest Tube:30]  Lab Results:  CBC: Recent Labs  05/07/15 0359  05/07/15 2140 05/08/15 0429  WBC 15.2*  --   --  9.1  HGB 9.7*  < > 8.5* 7.4*  HCT 28.8*  < > 25.3* 22.3*  PLT 133*  --   --  103*  < > = values in this interval not displayed.  BMET:  Recent Labs  05/07/15 0359 05/08/15 0429  NA 140 136  K 4.0 4.0  CL 106 100*  CO2 28 32  GLUCOSE 200* 149*  BUN 6 8  CREATININE 1.20 1.14  CALCIUM 8.4* 8.0*     PT/INR:   Recent Labs  05/07/15 0030  LABPROT 17.9*  INR 1.47    CBG (last 3)   Recent Labs  05/06/15 2344  GLUCAP 217*    ABG    Component Value Date/Time   PHART 7.345* 05/06/2015 2348   PCO2ART 47.4* 05/06/2015 2348   PO2ART 58.0* 05/06/2015 2348   HCO3 26.0* 05/06/2015 2348   TCO2 27 05/06/2015 2348   O2SAT 88.0 05/06/2015 2348     CXR: PORTABLE CHEST 1 VIEW  COMPARISON: May 07, 2015  FINDINGS: Central catheter tip is in the superior vena cava near the cavoatrial junction, stable. Chest tube on the right is unchanged. No pneumothorax. There is persistent elevation of the right hemidiaphragm. There are stable appearing bilateral pleural effusions, larger on the right than on the left. There is atelectatic change in the left base. No new opacity. Heart is mildly prominent but stable. Pulmonary vascularity is normal. No adenopathy appreciable.  IMPRESSION: Pleural effusions bilaterally, larger on the right than on the left. Elevation right hemidiaphragm. Left base atelectasis. No change in cardiac silhouette. Tube and catheter positions as described without pneumothorax.   Electronically Signed  By: Lowella Grip III M.D.  On: 05/08/2015 07:35   Assessment/Plan: S/P Procedure(s) (LRB): RESECTION RIGHT ANTERIOR CHEST WALL MASS WITH RECONSTRUCTION USING BARD MESH (Right)  Clinically patient looks and feels better, resp status and hemodynamics stable  However, Hbg/Hct has drifted down over last 24 hours Chest tube output remains low, chestwall appearance looks stable and no other clinical signs of ongoing blood loss CXR unchanged w/ right-sided opacity and elevation of right diaphragm c/w atelectasis w/ possible effusion and/or  possible soft tissue opacity (hematoma) external to chestwall patch    Transfuse 2 units PRBC's  Continue to follow serial Hgb/Hct  Leave chest tube in place and watch output  Continue pulm toilet, BiPAP as needed for sleep  Consider CT chest to look for hemothorax and/or hematoma external to chestwall patch - however, discussed w/ patient and his daughter at bedside who hope to avoid CT because he required general anesthesia to have CT preoperatively  Keep in SICU and reconsider CT and/or reexploration of right chest if patient shows signs of continued blood  loss  SCD boots for DVT prophylaxis - lovenox has been discontinued   Rexene Alberts, MD 05/08/2015 9:24 AM

## 2015-05-08 NOTE — Progress Notes (Signed)
Placed patient on Bipap (16/6 RR 10 40%) due to increased respiratory distress. Patient is tolerating it well and RT will continue to monitor.

## 2015-05-08 NOTE — Progress Notes (Signed)
TCTS BRIEF SICU PROGRESS NOTE  3 Days Post-Op  S/P Procedure(s) (LRB): RESECTION RIGHT ANTERIOR CHEST WALL MASS WITH RECONSTRUCTION USING BARD MESH (Right)   Stable day.  Breathing comfortably O2 sats 93-94% on 6 L/min NSR-sinus tach w/ stable BP Minimal chest tube output Diuresing very well Hgb/Hct up to 9.3/28 after 2 units PRBCs  Plan: Continue current plan  Rexene Alberts, MD 05/08/2015 6:00 PM

## 2015-05-09 ENCOUNTER — Encounter (HOSPITAL_COMMUNITY)
Admission: RE | Disposition: A | Discharge status: Rehabilitation Facility | Payer: Self-pay | Source: Ambulatory Visit | Attending: Thoracic Surgery (Cardiothoracic Vascular Surgery)

## 2015-05-09 ENCOUNTER — Encounter (HOSPITAL_COMMUNITY): Payer: Self-pay | Admitting: Certified Registered"

## 2015-05-09 ENCOUNTER — Inpatient Hospital Stay (HOSPITAL_COMMUNITY): Payer: Medicare HMO

## 2015-05-09 ENCOUNTER — Inpatient Hospital Stay (HOSPITAL_COMMUNITY): Payer: Medicare HMO | Admitting: Certified Registered"

## 2015-05-09 DIAGNOSIS — I97638 Postprocedural hematoma of a circulatory system organ or structure following other circulatory system procedure: Secondary | ICD-10-CM

## 2015-05-09 HISTORY — PX: THORACOTOMY: SHX5074

## 2015-05-09 HISTORY — PX: HEMATOMA EVACUATION: SHX5118

## 2015-05-09 LAB — COMPREHENSIVE METABOLIC PANEL
ALK PHOS: 42 U/L (ref 38–126)
ALT: 21 U/L (ref 17–63)
AST: 31 U/L (ref 15–41)
Albumin: 2.8 g/dL — ABNORMAL LOW (ref 3.5–5.0)
Anion gap: 8 (ref 5–15)
BILIRUBIN TOTAL: 0.9 mg/dL (ref 0.3–1.2)
BUN: 6 mg/dL (ref 6–20)
CALCIUM: 8.2 mg/dL — AB (ref 8.9–10.3)
CO2: 34 mmol/L — ABNORMAL HIGH (ref 22–32)
CREATININE: 0.78 mg/dL (ref 0.61–1.24)
Chloride: 99 mmol/L — ABNORMAL LOW (ref 101–111)
Glucose, Bld: 132 mg/dL — ABNORMAL HIGH (ref 65–99)
Potassium: 3.9 mmol/L (ref 3.5–5.1)
Sodium: 141 mmol/L (ref 135–145)
Total Protein: 5.3 g/dL — ABNORMAL LOW (ref 6.5–8.1)

## 2015-05-09 LAB — POCT I-STAT 3, ART BLOOD GAS (G3+)
ACID-BASE EXCESS: 12 mmol/L — AB (ref 0.0–2.0)
Acid-Base Excess: 8 mmol/L — ABNORMAL HIGH (ref 0.0–2.0)
BICARBONATE: 33.8 meq/L — AB (ref 20.0–24.0)
BICARBONATE: 35.8 meq/L — AB (ref 20.0–24.0)
O2 SAT: 93 %
O2 Saturation: 90 %
PCO2 ART: 51.5 mmHg — AB (ref 35.0–45.0)
PO2 ART: 55 mmHg — AB (ref 80.0–100.0)
Patient temperature: 97.4
TCO2: 35 mmol/L (ref 0–100)
TCO2: 37 mmol/L (ref 0–100)
pCO2 arterial: 44 mmHg (ref 35.0–45.0)
pH, Arterial: 7.422 (ref 7.350–7.450)
pH, Arterial: 7.522 — ABNORMAL HIGH (ref 7.350–7.450)
pO2, Arterial: 64 mmHg — ABNORMAL LOW (ref 80.0–100.0)

## 2015-05-09 LAB — POCT I-STAT, CHEM 8
BUN: 7 mg/dL (ref 6–20)
CREATININE: 0.8 mg/dL (ref 0.61–1.24)
Calcium, Ion: 1.23 mmol/L (ref 1.13–1.30)
Chloride: 100 mmol/L — ABNORMAL LOW (ref 101–111)
GLUCOSE: 116 mg/dL — AB (ref 65–99)
HEMATOCRIT: 28 % — AB (ref 39.0–52.0)
HEMOGLOBIN: 9.5 g/dL — AB (ref 13.0–17.0)
POTASSIUM: 3.3 mmol/L — AB (ref 3.5–5.1)
Sodium: 137 mmol/L (ref 135–145)
TCO2: 31 mmol/L (ref 0–100)

## 2015-05-09 LAB — POCT I-STAT 7, (LYTES, BLD GAS, ICA,H+H)
ACID-BASE EXCESS: 8 mmol/L — AB (ref 0.0–2.0)
BICARBONATE: 35.3 meq/L — AB (ref 20.0–24.0)
CALCIUM ION: 1.13 mmol/L (ref 1.13–1.30)
HEMATOCRIT: 31 % — AB (ref 39.0–52.0)
Hemoglobin: 10.5 g/dL — ABNORMAL LOW (ref 13.0–17.0)
O2 Saturation: 92 %
Potassium: 4.1 mmol/L (ref 3.5–5.1)
SODIUM: 139 mmol/L (ref 135–145)
TCO2: 37 mmol/L (ref 0–100)
pCO2 arterial: 62.7 mmHg (ref 35.0–45.0)
pH, Arterial: 7.353 (ref 7.350–7.450)
pO2, Arterial: 65 mmHg — ABNORMAL LOW (ref 80.0–100.0)

## 2015-05-09 LAB — BLOOD GAS, ARTERIAL
Acid-Base Excess: 9 mmol/L — ABNORMAL HIGH (ref 0.0–2.0)
Bicarbonate: 34.4 mEq/L — ABNORMAL HIGH (ref 20.0–24.0)
DRAWN BY: 362771
Delivery systems: POSITIVE
Expiratory PAP: 6
FIO2: 0.4
Inspiratory PAP: 14
O2 SAT: 90.9 %
PATIENT TEMPERATURE: 98.6
PCO2 ART: 60.9 mmHg — AB (ref 35.0–45.0)
PH ART: 7.37 (ref 7.350–7.450)
Pressure support: 8 cmH2O
RATE: 10 resp/min
TCO2: 36.2 mmol/L (ref 0–100)
pO2, Arterial: 58 mmHg — ABNORMAL LOW (ref 80.0–100.0)

## 2015-05-09 LAB — BASIC METABOLIC PANEL
Anion gap: 6 (ref 5–15)
BUN: 6 mg/dL (ref 6–20)
CHLORIDE: 98 mmol/L — AB (ref 101–111)
CO2: 33 mmol/L — AB (ref 22–32)
Calcium: 8.1 mg/dL — ABNORMAL LOW (ref 8.9–10.3)
Creatinine, Ser: 0.84 mg/dL (ref 0.61–1.24)
GFR calc Af Amer: >60 mL/min (ref >60)
GFR calc non Af Amer: >60 mL/min (ref >60)
GLUCOSE: 141 mg/dL — AB (ref 65–99)
POTASSIUM: 3.6 mmol/L (ref 3.5–5.1)
Sodium: 137 mmol/L (ref 135–145)

## 2015-05-09 LAB — CBC
HCT: 31.3 % — ABNORMAL LOW (ref 39.0–52.0)
HCT: 30.6 % — ABNORMAL LOW (ref 39.0–52.0)
HEMATOCRIT: 29.2 % — AB (ref 39.0–52.0)
HEMOGLOBIN: 10.6 g/dL — AB (ref 13.0–17.0)
HEMOGLOBIN: 10.3 g/dL — AB (ref 13.0–17.0)
HEMOGLOBIN: 9.8 g/dL — AB (ref 13.0–17.0)
MCH: 29.9 pg (ref 26.0–34.0)
MCH: 29.9 pg (ref 26.0–34.0)
MCH: 29.5 pg (ref 26.0–34.0)
MCHC: 33.9 g/dL (ref 30.0–36.0)
MCHC: 33.7 g/dL (ref 30.0–36.0)
MCHC: 33.6 g/dL (ref 30.0–36.0)
MCV: 88.4 fL (ref 78.0–100.0)
MCV: 88.7 fL (ref 78.0–100.0)
MCV: 88 fL (ref 78.0–100.0)
Platelets: 121 10*3/uL — ABNORMAL LOW (ref 150–400)
Platelets: 122 10*3/uL — ABNORMAL LOW (ref 150–400)
Platelets: 138 10*3/uL — ABNORMAL LOW (ref 150–400)
RBC: 3.54 MIL/uL — AB (ref 4.22–5.81)
RBC: 3.45 MIL/uL — ABNORMAL LOW (ref 4.22–5.81)
RBC: 3.32 MIL/uL — ABNORMAL LOW (ref 4.22–5.81)
RDW: 15.1 % (ref 11.5–15.5)
RDW: 15.4 % (ref 11.5–15.5)
RDW: 15.2 % (ref 11.5–15.5)
WBC: 9.3 10*3/uL (ref 4.0–10.5)
WBC: 9.4 10*3/uL (ref 4.0–10.5)
WBC: 8.5 10*3/uL (ref 4.0–10.5)

## 2015-05-09 LAB — GLUCOSE, CAPILLARY
GLUCOSE-CAPILLARY: 101 mg/dL — AB (ref 65–99)
GLUCOSE-CAPILLARY: 88 mg/dL (ref 65–99)
Glucose-Capillary: 141 mg/dL — ABNORMAL HIGH (ref 65–99)
Glucose-Capillary: 113 mg/dL — ABNORMAL HIGH (ref 65–99)

## 2015-05-09 LAB — APTT: aPTT: 42 seconds — ABNORMAL HIGH (ref 24–37)

## 2015-05-09 LAB — PROTIME-INR
INR: 1.54 — AB (ref 0.00–1.49)
PROTHROMBIN TIME: 18.5 s — AB (ref 11.6–15.2)

## 2015-05-09 LAB — HEMOGLOBIN AND HEMATOCRIT, BLOOD
HEMATOCRIT: 29.5 % — AB (ref 39.0–52.0)
HEMOGLOBIN: 9.7 g/dL — AB (ref 13.0–17.0)

## 2015-05-09 LAB — PREPARE RBC (CROSSMATCH)

## 2015-05-09 LAB — TRIGLYCERIDES: Triglycerides: 112 mg/dL (ref <150)

## 2015-05-09 SURGERY — THORACOTOMY, MAJOR
Anesthesia: General | Site: Chest | Laterality: Right

## 2015-05-09 MED ORDER — SUCCINYLCHOLINE CHLORIDE 20 MG/ML IJ SOLN
INTRAMUSCULAR | Status: DC | PRN
  Administered 2015-05-09: 100 mg via INTRAVENOUS

## 2015-05-09 MED ORDER — ALBUMIN HUMAN 5 % IV SOLN
INTRAVENOUS | Status: DC | PRN
  Administered 2015-05-09: 05:00:00 via INTRAVENOUS

## 2015-05-09 MED ORDER — DEXTROSE 5 % IV SOLN
1.5000 g | Freq: Two times a day (BID) | INTRAVENOUS | Status: AC
  Administered 2015-05-09 (×2): 1.5 g via INTRAVENOUS
  Filled 2015-05-09 (×3): qty 1.5

## 2015-05-09 MED ORDER — POTASSIUM CHLORIDE 10 MEQ/50ML IV SOLN
10.0000 meq | Freq: Every day | INTRAVENOUS | Status: DC | PRN
  Filled 2015-05-09 (×5): qty 50

## 2015-05-09 MED ORDER — PROPOFOL 1000 MG/100ML IV EMUL
5.0000 ug/kg/min | INTRAVENOUS | Status: DC
  Administered 2015-05-09: 25 ug/kg/min via INTRAVENOUS
  Administered 2015-05-09: 30 ug/kg/min via INTRAVENOUS
  Administered 2015-05-09: 50 ug/kg/min via INTRAVENOUS
  Administered 2015-05-10: 10 ug/kg/min via INTRAVENOUS
  Administered 2015-05-10 (×2): 30 ug/kg/min via INTRAVENOUS
  Administered 2015-05-10 (×2): 35 ug/kg/min via INTRAVENOUS
  Administered 2015-05-11: 25 ug/kg/min via INTRAVENOUS
  Administered 2015-05-11: 18 ug/kg/min via INTRAVENOUS
  Administered 2015-05-11 – 2015-05-12 (×3): 25 ug/kg/min via INTRAVENOUS
  Administered 2015-05-12: 12 ug/kg/min via INTRAVENOUS
  Administered 2015-05-12: 15 ug/kg/min via INTRAVENOUS
  Administered 2015-05-12 (×2): 25 ug/kg/min via INTRAVENOUS
  Administered 2015-05-13 (×2): 10 ug/kg/min via INTRAVENOUS
  Administered 2015-05-14 (×2): 15 ug/kg/min via INTRAVENOUS
  Administered 2015-05-14: 20 ug/kg/min via INTRAVENOUS
  Administered 2015-05-14 – 2015-05-15 (×2): 15 ug/kg/min via INTRAVENOUS
  Administered 2015-05-15 – 2015-05-17 (×4): 10 ug/kg/min via INTRAVENOUS
  Administered 2015-05-17 – 2015-05-18 (×3): 20 ug/kg/min via INTRAVENOUS
  Administered 2015-05-18: 10 ug/kg/min via INTRAVENOUS
  Administered 2015-05-18: 20 ug/kg/min via INTRAVENOUS
  Administered 2015-05-18 (×2): 10 ug/kg/min via INTRAVENOUS
  Administered 2015-05-19: 20 ug/kg/min via INTRAVENOUS
  Administered 2015-05-19: 15 ug/kg/min via INTRAVENOUS
  Administered 2015-05-19: 10 ug/kg/min via INTRAVENOUS
  Administered 2015-05-19: 20 ug/kg/min via INTRAVENOUS
  Administered 2015-05-20 – 2015-05-21 (×3): 10 ug/kg/min via INTRAVENOUS
  Filled 2015-05-09 (×42): qty 100

## 2015-05-09 MED ORDER — POTASSIUM CHLORIDE IN NACL 20-0.9 MEQ/L-% IV SOLN
INTRAVENOUS | Status: DC
Start: 2015-05-09 — End: 2015-05-18
  Administered 2015-05-09 – 2015-05-10 (×2): via INTRAVENOUS
  Administered 2015-05-12: 1000 mL via INTRAVENOUS
  Administered 2015-05-14: 30 mL/h via INTRAVENOUS
  Administered 2015-05-15: 08:00:00 via INTRAVENOUS
  Administered 2015-05-17: 30 mL/h via INTRAVENOUS
  Filled 2015-05-09 (×11): qty 1000

## 2015-05-09 MED ORDER — DIPHENHYDRAMINE HCL 50 MG/ML IJ SOLN: 12.5000 mg | Freq: Four times a day (QID) | INTRAMUSCULAR | Status: DC | PRN

## 2015-05-09 MED ORDER — FENTANYL CITRATE (PF) 100 MCG/2ML IJ SOLN: 50.0000 ug | Freq: Once | INTRAMUSCULAR | Status: DC

## 2015-05-09 MED ORDER — CEFAZOLIN SODIUM-DEXTROSE 2-3 GM-% IV SOLR
INTRAVENOUS | Status: DC | PRN
  Administered 2015-05-09: 2 g via INTRAVENOUS

## 2015-05-09 MED ORDER — SODIUM CHLORIDE 0.9 % IV SOLN: INTRAVENOUS | Status: DC | PRN

## 2015-05-09 MED ORDER — SODIUM CHLORIDE 0.9 % IV SOLN: Freq: Once | INTRAVENOUS | Status: DC

## 2015-05-09 MED ORDER — SUCCINYLCHOLINE CHLORIDE 20 MG/ML IJ SOLN
INTRAMUSCULAR | Status: AC
  Filled 2015-05-09: qty 1

## 2015-05-09 MED ORDER — CHLORHEXIDINE GLUCONATE 0.12% ORAL RINSE (MEDLINE KIT)
15.0000 mL | Freq: Two times a day (BID) | OROMUCOSAL | Status: DC
  Administered 2015-05-09 – 2015-05-22 (×27): 15 mL via OROMUCOSAL

## 2015-05-09 MED ORDER — DIPHENHYDRAMINE HCL 12.5 MG/5ML PO ELIX
12.5000 mg | ORAL_SOLUTION | Freq: Four times a day (QID) | ORAL | Status: DC | PRN
  Filled 2015-05-09: qty 5

## 2015-05-09 MED ORDER — SODIUM CHLORIDE 0.9 % IJ SOLN: 9.0000 mL | INTRAMUSCULAR | Status: DC | PRN

## 2015-05-09 MED ORDER — DIAZEPAM 1 MG/ML PO SOLN
10.0000 mg | Freq: Two times a day (BID) | ORAL | Status: DC | PRN
Start: 2015-05-09 — End: 2015-05-31
  Administered 2015-05-10 – 2015-05-31 (×11): 10 mg via ORAL
  Filled 2015-05-09 (×10): qty 10

## 2015-05-09 MED ORDER — PROPOFOL 10 MG/ML IV BOLUS
INTRAVENOUS | Status: DC | PRN
  Administered 2015-05-09: 100 mg via INTRAVENOUS

## 2015-05-09 MED ORDER — NALOXONE HCL 0.4 MG/ML IJ SOLN: 0.4000 mg | INTRAMUSCULAR | Status: DC | PRN

## 2015-05-09 MED ORDER — ANTISEPTIC ORAL RINSE SOLUTION (CORINZ)
7.0000 mL | Freq: Four times a day (QID) | OROMUCOSAL | Status: DC
  Administered 2015-05-09 – 2015-05-22 (×51): 7 mL via OROMUCOSAL

## 2015-05-09 MED ORDER — ACETAMINOPHEN 160 MG/5ML PO SOLN
1000.0000 mg | Freq: Four times a day (QID) | ORAL | Status: AC
  Administered 2015-05-10 – 2015-05-14 (×11): 1000 mg via ORAL
  Filled 2015-05-09 (×11): qty 40.6

## 2015-05-09 MED ORDER — FENTANYL BOLUS VIA INFUSION
25.0000 ug | INTRAVENOUS | Status: DC | PRN
  Administered 2015-05-10 – 2015-05-13 (×8): 25 ug via INTRAVENOUS
  Filled 2015-05-09 (×2): qty 25

## 2015-05-09 MED ORDER — ROCURONIUM BROMIDE 50 MG/5ML IV SOLN
INTRAVENOUS | Status: AC
  Filled 2015-05-09: qty 2

## 2015-05-09 MED ORDER — ACETAMINOPHEN 500 MG PO TABS
1000.0000 mg | ORAL_TABLET | Freq: Four times a day (QID) | ORAL | Status: AC
  Administered 2015-05-11 – 2015-05-12 (×3): 1000 mg via ORAL
  Filled 2015-05-09 (×16): qty 2

## 2015-05-09 MED ORDER — ONDANSETRON HCL 4 MG/2ML IJ SOLN
4.0000 mg | Freq: Four times a day (QID) | INTRAMUSCULAR | Status: DC | PRN
  Administered 2015-05-17: 4 mg via INTRAVENOUS
  Filled 2015-05-09 (×2): qty 2

## 2015-05-09 MED ORDER — FENTANYL CITRATE (PF) 2500 MCG/50ML IJ SOLN
25.0000 ug/h | INTRAMUSCULAR | Status: DC
  Administered 2015-05-09 – 2015-05-10 (×2): 100 ug/h via INTRAVENOUS
  Administered 2015-05-10: 250 ug/h via INTRAVENOUS
  Administered 2015-05-11: 300 ug/h via INTRAVENOUS
  Administered 2015-05-11 – 2015-05-12 (×2): 325 ug/h via INTRAVENOUS
  Administered 2015-05-12: 275 ug/h via INTRAVENOUS
  Administered 2015-05-13: 225 ug/h via INTRAVENOUS
  Administered 2015-05-13: 270 ug/h via INTRAVENOUS
  Administered 2015-05-14 (×3): 275 ug/h via INTRAVENOUS
  Administered 2015-05-15 – 2015-05-16 (×4): 300 ug/h via INTRAVENOUS
  Administered 2015-05-16: 250 ug/h via INTRAVENOUS
  Administered 2015-05-17: 300 ug/h via INTRAVENOUS
  Administered 2015-05-17 (×2): 200 ug/h via INTRAVENOUS
  Administered 2015-05-18: 225 ug/h via INTRAVENOUS
  Administered 2015-05-18: 200 ug/h via INTRAVENOUS
  Administered 2015-05-19 (×2): 225 ug/h via INTRAVENOUS
  Administered 2015-05-20 – 2015-05-21 (×3): 250 ug/h via INTRAVENOUS
  Administered 2015-05-21: 175 ug/h via INTRAVENOUS
  Administered 2015-05-22: 300 ug/h via INTRAVENOUS
  Filled 2015-05-09 (×31): qty 50

## 2015-05-09 MED ORDER — FENTANYL 10 MCG/ML IV SOLN
INTRAVENOUS | Status: DC
  Filled 2015-05-09: qty 50

## 2015-05-09 MED ORDER — FENTANYL CITRATE (PF) 100 MCG/2ML IJ SOLN
INTRAMUSCULAR | Status: DC | PRN
  Administered 2015-05-09: 100 ug via INTRAVENOUS

## 2015-05-09 MED ORDER — VANCOMYCIN HCL IN DEXTROSE 1-5 GM/200ML-% IV SOLN
1000.0000 mg | Freq: Two times a day (BID) | INTRAVENOUS | Status: AC
  Administered 2015-05-09: 1000 mg via INTRAVENOUS
  Filled 2015-05-09 (×2): qty 200

## 2015-05-09 MED ORDER — NICOTINE 21 MG/24HR TD PT24
21.0000 mg | MEDICATED_PATCH | Freq: Every day | TRANSDERMAL | Status: DC
  Administered 2015-05-09 – 2015-05-19 (×11): 21 mg via TRANSDERMAL
  Filled 2015-05-09 (×12): qty 1

## 2015-05-09 MED ORDER — CHLORHEXIDINE GLUCONATE 0.12 % MT SOLN: 15.0000 mL | Freq: Two times a day (BID) | OROMUCOSAL | Status: DC

## 2015-05-09 MED ORDER — PROPOFOL 10 MG/ML IV BOLUS
INTRAVENOUS | Status: AC
  Filled 2015-05-09: qty 20

## 2015-05-09 MED ORDER — EPHEDRINE SULFATE 50 MG/ML IJ SOLN
INTRAMUSCULAR | Status: AC
  Filled 2015-05-09: qty 1

## 2015-05-09 MED ORDER — ROCURONIUM BROMIDE 100 MG/10ML IV SOLN
INTRAVENOUS | Status: DC | PRN
  Administered 2015-05-09 (×3): 50 mg via INTRAVENOUS
  Administered 2015-05-09: 20 mg via INTRAVENOUS

## 2015-05-09 MED ORDER — FENTANYL CITRATE (PF) 250 MCG/5ML IJ SOLN
INTRAMUSCULAR | Status: AC
  Filled 2015-05-09: qty 5

## 2015-05-09 MED ORDER — ANTISEPTIC ORAL RINSE SOLUTION (CORINZ)
7.0000 mL | Freq: Four times a day (QID) | OROMUCOSAL | Status: DC
  Administered 2015-05-09: 7 mL via OROMUCOSAL

## 2015-05-09 MED ORDER — PROPOFOL 1000 MG/100ML IV EMUL
INTRAVENOUS | Status: AC
  Filled 2015-05-09: qty 100

## 2015-05-09 MED ORDER — CHLORHEXIDINE GLUCONATE 0.12% ORAL RINSE (MEDLINE KIT)
15.0000 mL | Freq: Two times a day (BID) | OROMUCOSAL | Status: DC
  Administered 2015-05-09: 15 mL via OROMUCOSAL

## 2015-05-09 MED ORDER — LACTATED RINGERS IV SOLN
INTRAVENOUS | Status: DC | PRN
  Administered 2015-05-09: 05:00:00 via INTRAVENOUS

## 2015-05-09 MED ORDER — SODIUM CHLORIDE 0.9 % IJ SOLN
INTRAMUSCULAR | Status: AC
  Filled 2015-05-09: qty 10

## 2015-05-09 MED ORDER — PANTOPRAZOLE SODIUM 40 MG IV SOLR
40.0000 mg | Freq: Two times a day (BID) | INTRAVENOUS | Status: DC
  Administered 2015-05-09 – 2015-05-26 (×33): 40 mg via INTRAVENOUS
  Filled 2015-05-09 (×42): qty 40

## 2015-05-09 MED ORDER — POTASSIUM CHLORIDE 10 MEQ/50ML IV SOLN
10.0000 meq | INTRAVENOUS | Status: AC
  Administered 2015-05-09 (×3): 10 meq via INTRAVENOUS

## 2015-05-09 MED ORDER — SODIUM CHLORIDE 0.9 % IR SOLN
Status: DC | PRN
  Administered 2015-05-09 (×2): 3000 mL
  Administered 2015-05-09: 1

## 2015-05-09 MED ORDER — INSULIN ASPART 100 UNIT/ML ~~LOC~~ SOLN
0.0000 [IU] | SUBCUTANEOUS | Status: DC
  Administered 2015-05-09 – 2015-05-21 (×10): 2 [IU] via SUBCUTANEOUS

## 2015-05-09 MED ORDER — BUDESONIDE-FORMOTEROL FUMARATE 160-4.5 MCG/ACT IN AERO
2.0000 | INHALATION_SPRAY | Freq: Every day | RESPIRATORY_TRACT | Status: DC
  Administered 2015-05-23 – 2015-05-31 (×9): 2 via RESPIRATORY_TRACT
  Filled 2015-05-09: qty 6

## 2015-05-09 SURGICAL SUPPLY — 74 items
ADH SKN CLS APL DERMABOND .7 (GAUZE/BANDAGES/DRESSINGS)
APL SRG 22X2 LUM MLBL SLNT (VASCULAR PRODUCTS)
APPLICATOR TIP EXT COSEAL (VASCULAR PRODUCTS) IMPLANT
APPLIER CLIP ROT 10 11.4 M/L (STAPLE)
APR CLP MED LRG 11.4X10 (STAPLE)
BLADE SURG 11 STRL SS (BLADE) ×1 IMPLANT
BNDG CMPR 9X4 STRL LF SNTH (GAUZE/BANDAGES/DRESSINGS) ×2
BNDG ESMARK 4X9 LF (GAUZE/BANDAGES/DRESSINGS) ×1 IMPLANT
CANISTER SUCTION 2500CC (MISCELLANEOUS) ×3 IMPLANT
CATH KIT ON Q 5IN SLV (PAIN MANAGEMENT) IMPLANT
CATH THORACIC 28FR (CATHETERS) IMPLANT
CATH THORACIC 36FR (CATHETERS) ×1 IMPLANT
CATH THORACIC 36FR RT ANG (CATHETERS) ×1 IMPLANT
CLIP APPLIE ROT 10 11.4 M/L (STAPLE) IMPLANT
CLIP TI MEDIUM 24 (CLIP) ×1 IMPLANT
CLIP TI MEDIUM 6 (CLIP) ×3 IMPLANT
CLIP TI WIDE RED SMALL 24 (CLIP) ×1 IMPLANT
CONN ST 1/4X3/8  BEN (MISCELLANEOUS) ×2
CONN ST 1/4X3/8 BEN (MISCELLANEOUS) IMPLANT
CONN Y 3/8X3/8X3/8  BEN (MISCELLANEOUS) ×1
CONN Y 3/8X3/8X3/8 BEN (MISCELLANEOUS) IMPLANT
CONT SPEC 4OZ CLIKSEAL STRL BL (MISCELLANEOUS) ×6 IMPLANT
COVER SURGICAL LIGHT HANDLE (MISCELLANEOUS) ×3 IMPLANT
DERMABOND ADVANCED (GAUZE/BANDAGES/DRESSINGS)
DERMABOND ADVANCED .7 DNX12 (GAUZE/BANDAGES/DRESSINGS) IMPLANT
DRAIN CHANNEL 32F RND 10.7 FF (WOUND CARE) ×1 IMPLANT
DRAPE LAPAROSCOPIC ABDOMINAL (DRAPES) ×3 IMPLANT
DRAPE WARM FLUID 44X44 (DRAPE) ×3 IMPLANT
DRSG VAC ATS LRG SENSATRAC (GAUZE/BANDAGES/DRESSINGS) ×2 IMPLANT
ELECT BLADE 6.5 EXT (BLADE) ×1 IMPLANT
ELECT REM PT RETURN 9FT ADLT (ELECTROSURGICAL) ×3
ELECTRODE REM PT RTRN 9FT ADLT (ELECTROSURGICAL) ×2 IMPLANT
FLUID NSS /IRRIG 3000 ML XXX (IV SOLUTION) IMPLANT
GAUZE SPONGE 4X4 12PLY STRL (GAUZE/BANDAGES/DRESSINGS) ×3 IMPLANT
GLOVE EUDERMIC 7 POWDERFREE (GLOVE) ×6 IMPLANT
GOWN STRL REUS W/ TWL LRG LVL3 (GOWN DISPOSABLE) ×4 IMPLANT
GOWN STRL REUS W/TWL LRG LVL3 (GOWN DISPOSABLE) ×6
KIT BASIN OR (CUSTOM PROCEDURE TRAY) ×3 IMPLANT
KIT ROOM TURNOVER OR (KITS) ×3 IMPLANT
KIT SUCTION CATH 14FR (SUCTIONS) ×1 IMPLANT
NS IRRIG 1000ML POUR BTL (IV SOLUTION) ×12 IMPLANT
PACK CHEST (CUSTOM PROCEDURE TRAY) ×3 IMPLANT
PAD ARMBOARD 7.5X6 YLW CONV (MISCELLANEOUS) ×6 IMPLANT
PAD NEG PRESSURE SENSATRAC (MISCELLANEOUS) ×1 IMPLANT
SEALANT SURG COSEAL 4ML (VASCULAR PRODUCTS) IMPLANT
SEALANT SURG COSEAL 8ML (VASCULAR PRODUCTS) IMPLANT
SET IRRIG TUBING LAPAROSCOPIC (IRRIGATION / IRRIGATOR) ×4 IMPLANT
SOLUTION ANTI FOG 6CC (MISCELLANEOUS) ×3 IMPLANT
SPONGE LAP 18X18 X RAY DECT (DISPOSABLE) ×1 IMPLANT
STAPLER VISISTAT 35W (STAPLE) ×1 IMPLANT
SUT BONE WAX W31G (SUTURE) ×1 IMPLANT
SUT MNCRL AB 3-0 PS2 18 (SUTURE) ×1 IMPLANT
SUT PROLENE 3 0 SH 1 (SUTURE) ×3 IMPLANT
SUT PROLENE 3 0 SH DA (SUTURE) IMPLANT
SUT PROLENE 4 0 RB 1 (SUTURE)
SUT PROLENE 4-0 RB1 .5 CRCL 36 (SUTURE) IMPLANT
SUT SILK  1 MH (SUTURE) ×4
SUT SILK 1 MH (SUTURE) ×4 IMPLANT
SUT SILK 1 TIES 10X30 (SUTURE) IMPLANT
SUT SILK 2 0SH CR/8 30 (SUTURE) IMPLANT
SUT VIC AB 1 CTX 18 (SUTURE) IMPLANT
SUT VIC AB 1 CTX 36 (SUTURE) ×3
SUT VIC AB 1 CTX36XBRD ANBCTR (SUTURE) IMPLANT
SUT VIC AB 2-0 CTX 36 (SUTURE) ×1 IMPLANT
SUT VIC AB 3-0 X1 27 (SUTURE) ×4 IMPLANT
SUT VICRYL 2 TP 1 (SUTURE) IMPLANT
SYSTEM SAHARA CHEST DRAIN ATS (WOUND CARE) ×2 IMPLANT
SYSTEM SAHARA CHEST DRAIN RE-I (WOUND CARE) ×3 IMPLANT
TAPE CLOTH 4X10 WHT NS (GAUZE/BANDAGES/DRESSINGS) ×3 IMPLANT
TIP APPLICATOR SPRAY EXTEND 16 (VASCULAR PRODUCTS) IMPLANT
TOWEL OR 17X24 6PK STRL BLUE (TOWEL DISPOSABLE) ×6 IMPLANT
TOWEL OR 17X26 10 PK STRL BLUE (TOWEL DISPOSABLE) ×6 IMPLANT
TRAY FOLEY CATH 14FRSI W/METER (CATHETERS) ×3 IMPLANT
WATER STERILE IRR 1000ML POUR (IV SOLUTION) ×6 IMPLANT

## 2015-05-09 NOTE — Progress Notes (Signed)
PT Cancellation Note  Patient Details Name: Jerry [name] MRN: [id number] DOB: 1951/06/23   Cancelled Treatment:    Reason Eval/Treat Not Completed: Medical issues which prohibited therapy. Noted pt now ventilated and PT orders have been discontinued. Please reorder if/when appropriate for therapy.    Rolinda Roan 05/09/2015, 8:52 AM   Rolinda Roan, PT, DPT Acute Rehabilitation Services Pager: [phone]

## 2015-05-09 NOTE — Transfer of Care (Signed)
Immediate Anesthesia Transfer of Care Note  Patient: Jerry [name]  Procedure(s) Performed: Procedure(s) with comments: THORACOTOMY MAJOR (Right) - Exploration of right chest.  Removal chest wall plate and Temporary esmark clousure EVACUATION HEMATOMA (Right)  Patient Location: SICU  Anesthesia Type:General  Level of Consciousness: Patient remains intubated per anesthesia plan  Airway & Oxygen Therapy: Patient remains intubated per anesthesia plan and Patient placed on Ventilator (see vital sign flow sheet for setting)  Post-op Assessment: Report given to RN and Post -op Vital signs reviewed and stable  Post vital signs: Reviewed and stable  Last Vitals:  Filed Vitals:   05/09/15 0730  BP: 118/61  Pulse: 71  Temp:   Resp: 12    Complications: No apparent anesthesia complications

## 2015-05-09 NOTE — Op Note (Addendum)
CARDIOTHORACIC SURGERY OPERATIVE NOTE  Date of Procedure:   05/09/2015  Preoperative Diagnosis:  Bleeding s/p Right Thoracotomy for Resection of Chest Wall Mass  Postoperative Diagnosis:  same  Procedure:      Right thoracotomy for reexploration of right chest  Evacuation of chest wall hematoma and hemothorax  Removal of chest wall plate  Temporary Esmarch closure  Surgeon:    Valentina Gu. Roxy Manns, MD  Assistant:    Eugenie Birks, CRNFA  Anesthesia:    Duane Boston, MD  Operative Findings:   Large hematoma in chest wall external to chest wall plate  Large anterior hemothorax internal to chest wall plate   DETAILS OF THE OPERATIVE PROCEDURE  The patient is brought to the operating room on the above mentioned date and placed in the supine position on the operating table.  General endotracheal anesthesia is induced uneventfully under the care and direction of Dr. Tobias [name].  The patient is intubated using a dual lumen endotracheal tube. Intravenous antibiotics are administered. A soft roll is placed beneath the right scapula to elevate the patient's right side and the right arm is suspended on an elevated arm board.  The patient's right anterior and lateral chest were prepared and draped in a sterile manner.  The patient's previous right anterolateral thoracotomy incision is reopened. The anterior soft tissues are divided and a large amount of clot is appreciated beneath the pectoralis muscle. Clot is evacuated from all portions of the wound and the chest wall plate was exposed. It is obvious that there is a large amount of clot beneath the plate within the pleural space anteriorly. The chest wall plate is removed. All clot is evacuated from the soft tissues of the chest wall and the pleural space. No active bleeding is identified. There is raw surface area throughout with evidence of dense adhesions surrounding the right lung.  A continuous lavage irrigation device is utilized to gently  irrigate all remaining clot from the soft tissues and the right pleural space. An exhaustive search for sites of mechanical bleeding is performed and no surgical bleeding is identified. The right internal mammary artery vein are carefully examined. Transected ends of intercostal vessels from previously resected ribs were carefully inspected.  Some adhesions between the right lower lobe in the dome of the diaphragm are divided with electrocautery to facilitate exploration of the posterior pleural space. There is no hematoma posteriorly and all hematoma is notably anterior. The wound is again irrigated with a total of 3 L of warm saline.  The previous 12 French Bard drain is replaced posteriorly along the dome of the diaphragm. A second 44 French Bard drain is placed into the pleural space laterally. A pair of 49 French chest tubes are placed through separate stab incisions to thoroughly drain the pleural space. The wound is closed using a temporary Esmarch dressing sewn to the skin edges circumferentially. A wound vacuum-assisted closure device is applied on top of that Esmarch dressing. All chest tubes are attached to continuous suction and negative pressure is applied to the wound vacuum assisted closure device.   The patient is reintubated using a single lumen endotracheal tube and transported to the surgical intensive care unit in stable condition. There are no intraoperative complications. No blood products were administered. All sponge instrument and needle counts were verified correct at completion of the procedure.    Valentina Gu. Roxy Manns MD 05/09/2015 7:14 AM

## 2015-05-09 NOTE — Anesthesia Postprocedure Evaluation (Signed)
Anesthesia Post Note  Patient: Jerry [name]  Procedure(s) Performed: Procedure(s) (LRB): THORACOTOMY MAJOR (Right) EVACUATION HEMATOMA (Right)  Anesthesia type: General  Patient location: ICU  Post pain: Pain level controlled  Post assessment: Post-op Vital signs reviewed  Last Vitals:  Filed Vitals:   05/09/15 0730  BP: 118/61  Pulse: 71  Temp:   Resp: 12    Post vital signs: stable  Level of consciousness: Patient remains intubated per anesthesia plan  Complications: No apparent anesthesia complications

## 2015-05-09 NOTE — Progress Notes (Addendum)
TCTS BRIEF SICU PROGRESS NOTE  4 Days Post-Op  S/P Procedure(s) (LRB): RESECTION RIGHT ANTERIOR CHEST WALL MASS WITH RECONSTRUCTION USING BARD MESH (Right)   Overnight Jerry [name]'s Hgb/Hct dropped again to 7.2/21.5.  It increased to 9.7/29.5 after transfusion of another 2 units PRBC's but he has remained tachycardic.  BP stable and excellent UOP but respiratory status has deteriorated significantly w/ PaCO2 60 and PaO2 58 on BiPAP.  On physical exam he now has very poor breath sounds on the right side.  No significant change in external appearance of chest wall.  CXR essentially unchanged.  Plan: Return to OR for reexploration of right chest for presumed chest wall hematoma and hemothorax.  Discussed w/ patient and await arrival of patient's family.  Rexene Alberts, MD 05/09/2015 4:03 AM   Discussed situation at bedside with patient and his daughter Jerry [name].  They understand the indications for surgical reexploration and the possibility that his chest wall patch may need to be removed.  They also understand that under the circumstances Jerry [name] may need to remain on mechanical ventilatory support temporarily following surgery.  All questions answered.  Rexene Alberts, MD  05/09/2015 4:38 AM

## 2015-05-09 NOTE — Progress Notes (Addendum)
Wasted 73mL Fentanyl PCA in trash. Witnessed by Harvel Quale RN. Richarda Blade RN I concur with the waste Jenny Reichmann Margert Edsall,RN

## 2015-05-09 NOTE — Progress Notes (Signed)
      [address]       [place] [address]             [phone]      S/p take back for bleeding, VAC placement  Intubated, sedated  BP 117/48 mmHg  Pulse 63  Temp(Src) 100.4 F (38 C) (Axillary)  Resp 16  Ht 6' (1.829 m)  Wt 299 lb 13.2 oz (136 kg)  BMI 40.65 kg/m2  SpO2 100%   Intake/Output Summary (Last 24 hours) at 05/09/15 1726 Last data filed at 05/09/15 1630  Gross per 24 hour  Intake 2598.16 ml  Output   3855 ml  Net -1256.84 ml    CT with minimal drainage  Continue current care  Steven C. Roxan Hockey, MD [facility] [phone]

## 2015-05-09 NOTE — Procedures (Signed)
Arterial Catheter Insertion Procedure Note Jerry [name] [id number] 06/21/1951  Procedure: Insertion of Arterial Catheter  Indications: Blood pressure monitoring and Frequent blood sampling  Procedure Details Consent: Unable to obtain consent because of altered level of consciousness. Time Out: Verified patient identification, verified procedure, site/side was marked, verified correct patient position, special equipment/implants available, medications/allergies/relevent history reviewed, required imaging and test results available.  Performed  Maximum sterile technique was used including antiseptics, cap, gloves, gown, hand hygiene, mask and sheet. Skin prep: Chlorhexidine; local anesthetic administered 20 gauge catheter was inserted into right radial artery using the Seldinger technique.  Evaluation Blood flow good; BP tracing good. Complications: No apparent complications.   Loma Newton, Rodney Yera 05/09/2015

## 2015-05-09 NOTE — Progress Notes (Signed)
Pink tape replaced with ETT holder without complications.

## 2015-05-09 NOTE — Progress Notes (Signed)
Spoke with Dr. Roxy Manns regarding follow-up Hgb 9.7, persistent heart rate 140s-150s, SBP 90s-100s, c/o chest pain radiating right to left intermittently, absent breath sounds on right side of chest, and new bruising noted over left breast area. Made aware that STAT portable chest and EKG have been done. Orders received for another unit of PRBC, keep NPO, and to place arterial line. Will continue to closely monitor. Richarda Blade RN

## 2015-05-09 NOTE — Anesthesia Preprocedure Evaluation (Addendum)
Anesthesia Evaluation  Patient identified by MRN, date of birth, ID band Patient awake    Reviewed: Allergy & Precautions, NPO status , Patient's Chart, lab work & pertinent test results  History of Anesthesia Complications Negative for: history of anesthetic complications  Airway Mallampati: II  TM Distance: >3 FB Neck ROM: Full   Comment: On bi-pap Dental  (+) Edentulous Upper, Edentulous Lower   Pulmonary Current Smoker,     + decreased breath sounds      Cardiovascular hypertension, Pt. on medications and Pt. on home beta blockers + CAD and + Cardiac Stents   Rhythm:Regular Rate:Tachycardia  EF 65-70%   Neuro/Psych PSYCHIATRIC DISORDERS Anxiety Depression    GI/Hepatic   Endo/Other    Renal/GU      Musculoskeletal  (+) Arthritis ,   Abdominal   Peds  Hematology   Anesthesia Other Findings   Reproductive/Obstetrics                         Anesthesia Physical Anesthesia Plan  ASA: IV and emergent  Anesthesia Plan: General   Post-op Pain Management:    Induction: Intravenous  Airway Management Planned: Double Lumen EBT  Additional Equipment: Arterial line  Intra-op Plan:   Post-operative Plan: Post-operative intubation/ventilation  Informed Consent: I have reviewed the patients History and Physical, chart, labs and discussed the procedure including the risks, benefits and alternatives for the proposed anesthesia with the patient or authorized representative who has indicated his/her understanding and acceptance.   Dental advisory given  Plan Discussed with: Anesthesiologist, Surgeon and CRNA  Anesthesia Plan Comments:       Anesthesia Quick Evaluation

## 2015-05-09 NOTE — Brief Op Note (Signed)
05/05/2015 - 05/09/2015  7:13 AM  PATIENT:  Jerry [name]  64 y.o. male  PRE-OPERATIVE DIAGNOSIS:  Chest Wall Resection  POST-OPERATIVE DIAGNOSIS:  Chest Wall Resection  PROCEDURE:  Procedure(s) with comments: THORACOTOMY MAJOR (Right) - Exploration of right chest.  Removal chest wall plate and Temporary esmark clousure EVACUATION HEMATOMA (Right)  SURGEON:  Surgeon(s) and Role:    * Rexene Alberts, MD - Primary  PHYSICIAN ASSISTANT:   ASSISTANTS: Eugenie Birks, CRNFA   ANESTHESIA:   general  EBL:     BLOOD ADMINISTERED:none  DRAINS: 4 Chest Tube(s) in the right pleural space   LOCAL MEDICATIONS USED:  NONE  SPECIMEN:  No Specimen  DISPOSITION OF SPECIMEN:  N/A  COUNTS:  YES  TOURNIQUET:  * No tourniquets in log *  DICTATION: .Note written in EPIC  PLAN OF CARE: Admit to inpatient   PATIENT DISPOSITION:  ICU - intubated and hemodynamically stable.   Delay start of Pharmacological VTE agent (>24hrs) due to surgical blood loss or risk of bleeding: yes  Rexene Alberts, MD 05/09/2015 7:14 AM

## 2015-05-09 NOTE — Anesthesia Procedure Notes (Addendum)
Procedure Name: Intubation Date/Time: 05/09/2015 5:02 AM Performed by: Manuela Schwartz B Pre-anesthesia Checklist: Patient identified, Emergency Drugs available, Suction available, Patient being monitored and Timeout performed Patient Re-evaluated:Patient Re-evaluated prior to inductionOxygen Delivery Method: Circle system utilized Preoxygenation: Pre-oxygenation with 100% oxygen Intubation Type: IV induction and Rapid sequence Laryngoscope Size: Mac and 3 Grade View: Grade I Endobronchial tube: Left, Double lumen EBT, EBT position confirmed by auscultation and EBT position confirmed by fiberoptic bronchoscope and 39 Fr Number of attempts: 1 Airway Equipment and Method: Stylet Placement Confirmation: ETT inserted through vocal cords under direct vision,  positive ETCO2 and breath sounds checked- equal and bilateral Tube secured with: Tape Dental Injury: Teeth and Oropharynx as per pre-operative assessment    Anesthesia Procedure Note

## 2015-05-09 NOTE — Progress Notes (Signed)
Per Dr. Guy Sandifer request, called patient's son Scooter x2. Left voicemail for him to call the unit. Called patient's daughter Quita Skye, updated her on her dad's status and requested that she come to the hospital. She said was on her way, and would call Sam (patient's other daughter,) and try to reach Scooter again. Richarda Blade RN

## 2015-05-09 NOTE — Care Management Important Message (Signed)
Important Message  Patient Details  Name: Jerry [name] MRN: [id number] Date of Birth: 1951-04-04   Medicare Important Message Given:  Yes-second notification given    Delorse Lek 05/09/2015, 12:55 PM

## 2015-05-10 ENCOUNTER — Inpatient Hospital Stay (HOSPITAL_COMMUNITY): Payer: Medicare HMO

## 2015-05-10 ENCOUNTER — Encounter (HOSPITAL_COMMUNITY): Payer: Self-pay | Admitting: Thoracic Surgery (Cardiothoracic Vascular Surgery)

## 2015-05-10 LAB — CBC
HCT: 29.2 % — ABNORMAL LOW (ref 39.0–52.0)
HCT: 28.2 % — ABNORMAL LOW (ref 39.0–52.0)
HEMATOCRIT: 26.6 % — AB (ref 39.0–52.0)
HEMOGLOBIN: 8.7 g/dL — AB (ref 13.0–17.0)
HEMOGLOBIN: 9.4 g/dL — AB (ref 13.0–17.0)
Hemoglobin: 9.9 g/dL — ABNORMAL LOW (ref 13.0–17.0)
MCH: 30.1 pg (ref 26.0–34.0)
MCH: 29.3 pg (ref 26.0–34.0)
MCH: 30.1 pg (ref 26.0–34.0)
MCHC: 33.9 g/dL (ref 30.0–36.0)
MCHC: 32.7 g/dL (ref 30.0–36.0)
MCHC: 33.3 g/dL (ref 30.0–36.0)
MCV: 88.8 fL (ref 78.0–100.0)
MCV: 89.6 fL (ref 78.0–100.0)
MCV: 90.4 fL (ref 78.0–100.0)
PLATELETS: 144 10*3/uL — AB (ref 150–400)
PLATELETS: 166 10*3/uL (ref 150–400)
Platelets: 153 10*3/uL (ref 150–400)
RBC: 3.29 MIL/uL — AB (ref 4.22–5.81)
RBC: 2.97 MIL/uL — AB (ref 4.22–5.81)
RBC: 3.12 MIL/uL — AB (ref 4.22–5.81)
RDW: 15.2 % (ref 11.5–15.5)
RDW: 15.3 % (ref 11.5–15.5)
RDW: 15.3 % (ref 11.5–15.5)
WBC: 8.7 10*3/uL (ref 4.0–10.5)
WBC: 7.3 10*3/uL (ref 4.0–10.5)
WBC: 7.8 10*3/uL (ref 4.0–10.5)

## 2015-05-10 LAB — POCT I-STAT 3, ART BLOOD GAS (G3+)
Acid-Base Excess: 9 mmol/L — ABNORMAL HIGH (ref 0.0–2.0)
BICARBONATE: 32.2 meq/L — AB (ref 20.0–24.0)
O2 Saturation: 87 %
PCO2 ART: 38.5 mmHg (ref 35.0–45.0)
TCO2: 33 mmol/L (ref 0–100)
pH, Arterial: 7.533 — ABNORMAL HIGH (ref 7.350–7.450)
pO2, Arterial: 49 mmHg — ABNORMAL LOW (ref 80.0–100.0)

## 2015-05-10 LAB — BASIC METABOLIC PANEL
ANION GAP: 7 (ref 5–15)
ANION GAP: 6 (ref 5–15)
BUN: 8 mg/dL (ref 6–20)
BUN: 7 mg/dL (ref 6–20)
CALCIUM: 7.9 mg/dL — AB (ref 8.9–10.3)
CO2: 31 mmol/L (ref 22–32)
CO2: 33 mmol/L — ABNORMAL HIGH (ref 22–32)
Calcium: 8.1 mg/dL — ABNORMAL LOW (ref 8.9–10.3)
Chloride: 99 mmol/L — ABNORMAL LOW (ref 101–111)
Chloride: 100 mmol/L — ABNORMAL LOW (ref 101–111)
Creatinine, Ser: 0.88 mg/dL (ref 0.61–1.24)
Creatinine, Ser: 0.92 mg/dL (ref 0.61–1.24)
GFR calc non Af Amer: >60 mL/min (ref >60)
GLUCOSE: 108 mg/dL — AB (ref 65–99)
GLUCOSE: 120 mg/dL — AB (ref 65–99)
POTASSIUM: 3.2 mmol/L — AB (ref 3.5–5.1)
POTASSIUM: 3.2 mmol/L — AB (ref 3.5–5.1)
Sodium: 137 mmol/L (ref 135–145)
Sodium: 139 mmol/L (ref 135–145)

## 2015-05-10 LAB — GLUCOSE, CAPILLARY
GLUCOSE-CAPILLARY: 116 mg/dL — AB (ref 65–99)
GLUCOSE-CAPILLARY: 120 mg/dL — AB (ref 65–99)
Glucose-Capillary: 100 mg/dL — ABNORMAL HIGH (ref 65–99)
Glucose-Capillary: 101 mg/dL — ABNORMAL HIGH (ref 65–99)
Glucose-Capillary: 102 mg/dL — ABNORMAL HIGH (ref 65–99)
Glucose-Capillary: 103 mg/dL — ABNORMAL HIGH (ref 65–99)
Glucose-Capillary: 99 mg/dL (ref 65–99)

## 2015-05-10 LAB — BLOOD GAS, ARTERIAL
Acid-Base Excess: 9 mmol/L — ABNORMAL HIGH (ref 0.0–2.0)
Bicarbonate: 32.3 mEq/L — ABNORMAL HIGH (ref 20.0–24.0)
DRAWN BY: 437071
FIO2: 0.5
LHR: 16 {breaths}/min
O2 Saturation: 89.4 %
PEEP/CPAP: 5 cmH2O
Patient temperature: 99.4
TCO2: 33.5 mmol/L (ref 0–100)
VT: 600 mL
pCO2 arterial: 39.5 mmHg (ref 35.0–45.0)
pH, Arterial: 7.525 — ABNORMAL HIGH (ref 7.350–7.450)
pO2, Arterial: 50.9 mmHg — ABNORMAL LOW (ref 80.0–100.0)

## 2015-05-10 LAB — POCT I-STAT, CHEM 8
BUN: 9 mg/dL (ref 6–20)
CHLORIDE: 99 mmol/L — AB (ref 101–111)
Calcium, Ion: 1.12 mmol/L — ABNORMAL LOW (ref 1.13–1.30)
Creatinine, Ser: 0.9 mg/dL (ref 0.61–1.24)
Glucose, Bld: 126 mg/dL — ABNORMAL HIGH (ref 65–99)
HEMATOCRIT: 28 % — AB (ref 39.0–52.0)
Hemoglobin: 9.5 g/dL — ABNORMAL LOW (ref 13.0–17.0)
POTASSIUM: 3.3 mmol/L — AB (ref 3.5–5.1)
Sodium: 142 mmol/L (ref 135–145)
TCO2: 33 mmol/L (ref 0–100)

## 2015-05-10 LAB — POCT I-STAT 4, (NA,K, GLUC, HGB,HCT)
Glucose, Bld: 108 mg/dL — ABNORMAL HIGH (ref 65–99)
HCT: 28 % — ABNORMAL LOW (ref 39.0–52.0)
Hemoglobin: 9.5 g/dL — ABNORMAL LOW (ref 13.0–17.0)
Potassium: 3.8 mmol/L (ref 3.5–5.1)
SODIUM: 141 mmol/L (ref 135–145)

## 2015-05-10 LAB — MAGNESIUM: Magnesium: 2 mg/dL (ref 1.7–2.4)

## 2015-05-10 MED ORDER — POTASSIUM CHLORIDE 10 MEQ/50ML IV SOLN: 10.0000 meq | INTRAVENOUS | Status: DC

## 2015-05-10 MED ORDER — POTASSIUM CHLORIDE 10 MEQ/50ML IV SOLN
10.0000 meq | INTRAVENOUS | Status: AC
  Administered 2015-05-10 (×3): 10 meq via INTRAVENOUS
  Filled 2015-05-10 (×2): qty 50

## 2015-05-10 MED ORDER — POTASSIUM CHLORIDE 10 MEQ/50ML IV SOLN
10.0000 meq | INTRAVENOUS | Status: AC
  Administered 2015-05-10 (×2): 10 meq via INTRAVENOUS

## 2015-05-10 MED ORDER — POTASSIUM CHLORIDE 10 MEQ/50ML IV SOLN
10.0000 meq | INTRAVENOUS | Status: AC
  Administered 2015-05-10 (×3): 10 meq via INTRAVENOUS

## 2015-05-10 MED ORDER — FUROSEMIDE 10 MG/ML IJ SOLN
40.0000 mg | Freq: Once | INTRAMUSCULAR | Status: AC
  Administered 2015-05-10: 40 mg via INTRAVENOUS
  Filled 2015-05-10: qty 4

## 2015-05-10 MED ORDER — ALBUTEROL SULFATE (2.5 MG/3ML) 0.083% IN NEBU
2.5000 mg | INHALATION_SOLUTION | Freq: Four times a day (QID) | RESPIRATORY_TRACT | Status: DC | PRN
  Administered 2015-05-10 – 2015-05-11 (×3): 2.5 mg via RESPIRATORY_TRACT
  Filled 2015-05-10 (×3): qty 3

## 2015-05-10 NOTE — Progress Notes (Signed)
1 Day Post-Op Procedure(s) (LRB): THORACOTOMY MAJOR (Right) EVACUATION HEMATOMA (Right) Subjective: Intubated, sedated but follows commands  Objective: Vital signs in last 24 hours: Temp:  [99.4 F (37.4 C)-100.4 F (38 C)] 99.4 F (37.4 C) (10/04 0400) Pulse Rate:  [54-85] 67 (10/04 0728) Cardiac Rhythm:  [-] Normal sinus rhythm (10/03 2000) Resp:  [12-32] 15 (10/04 0728) BP: (95-137)/(45-95) 117/45 mmHg (10/04 0728) SpO2:  [91 %-100 %] 99 % (10/04 0728) Arterial Line BP: (83-143)/(37-63) 117/52 mmHg (10/04 0600) FiO2 (%):  [50 %-70 %] 50 % (10/04 0728)  Hemodynamic parameters for last 24 hours:    Intake/Output from previous day: 10/03 0701 - 10/04 0700 In: 2373.8 [I.V.:1863.8; NG/GT:60; IV Piggyback:450] Out: 2600 [Urine:1490; Emesis/NG output:500; Chest Tube:610] Intake/Output this shift:    General appearance: cooperative Neurologic: intact Heart: regular rate and rhythm Lungs: rhonchi bilaterally Abdomen: normal findings: soft, non-tender Wound: VAC in place  Lab Results:  Recent Labs  05/09/15 1611 05/09/15 1617 05/10/15 0400  WBC 8.5  --  8.7  HGB 9.8* 9.5* 9.9*  HCT 29.2* 28.0* 29.2*  PLT 138*  --  144*   BMET:  Recent Labs  05/09/15 0824 05/09/15 1617 05/10/15 0400  NA 137 137 137  K 3.6 3.3* 3.2*  CL 98* 100* 99*  CO2 33*  --  31  GLUCOSE 141* 116* 108*  BUN 6 7 8   CREATININE 0.84 0.80 0.88  CALCIUM 8.1*  --  8.1*    PT/INR:  Recent Labs  05/09/15 0434  LABPROT 18.5*  INR 1.54*   ABG    Component Value Date/Time   PHART 7.525* 05/10/2015 0403   HCO3 32.3* 05/10/2015 0403   TCO2 33.5 05/10/2015 0403   O2SAT 89.4 05/10/2015 0403   CBG (last 3)   Recent Labs  05/09/15 1922 05/09/15 2345 05/10/15 0359  GLUCAP 113* 116* 101*    Assessment/Plan: S/P Procedure(s) (LRB): THORACOTOMY MAJOR (Right) EVACUATION HEMATOMA (Right) -  CV- stable  RESP- sats OK but pO2 only 50 on 50% and 5 of PEEP- will increase PEEP to  8  Rhonchi and wheezing bilaterally- continue symbicort, add albuterol PRN  He is not weanable this morning  RENAL- hypokalemia- supplement  Diurese  Anemia secondary to ABL- stable, no signs of active bleeding  Pain- has indwelling pump, receiving fentanyl  ID- low grade temp yesterday likely secondary to stress, transfusion etc  No signs of infection  Will place feeding tube today in cases he ends up needing a prolonged vent course   LOS: 5 days    [name] 05/10/2015

## 2015-05-10 NOTE — Progress Notes (Signed)
Initial Nutrition Assessment  DOCUMENTATION CODES:   Morbid obesity  INTERVENTION:    If TF started, recommend Vital High Protein formula -- initiate at 20 ml/hr and increase by 10 ml in 4 hours to goal rate of 30 ml/hr   Prostat liquid protein 60 ml TID  TF regimen + Propofol infusion to provide 1979 kcals, 163 gm protein, 602 ml of free water  NUTRITION DIAGNOSIS:   Inadequate oral intake related to inability to eat as evidenced by NPO status  GOAL:   Provide needs based on ASPEN/SCCM guidelines  MONITOR:   Vent status, Labs, Weight trends, Skin, I & O's  REASON FOR ASSESSMENT:   Ventilator  ASSESSMENT:   64 yo Male who has chronic pain due to multiple orthopedic injuries and surgeries in the past. His history is also significant for tobacco abuse, hypertension, arthritis, allergies and CAD. 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  Patient s/p procedures 10/3: THORACOTOMY MAJOR (RIGHT) EXPLORATION OF RIGHT CHEST REMOVAL CHEST WALL PLATE EVACUATION HEMATOMA (RIGHT)  Patient is currently intubated on ventilator support -- OGT in place MV: 14.7 L/min Temp (24hrs), Avg:100 F (37.8 C), Min:99.4 F (37.4 C), Max:100.4 F (38 C)   Propofol: 20.4 ml/hr -----> 539 fat kcals   RD unable to complete Nutrition Focused Physical Exam at this time.  Diet Order:  Diet NPO time specified  Skin:   R chest wound VAC   Last BM:  N/A  Height:   Ht Readings from Last 1 Encounters:  05/07/15 6' (1.829 m)    Weight:   Wt Readings from Last 1 Encounters:  05/07/15 299 lb 13.2 oz (136 kg)    Ideal Body Weight:  81 kg  BMI:  Body mass index is 40.65 kg/(m^2).  Estimated Nutritional Needs:   Kcal:  3149-7026  Protein:  160-170 gm  Fluid:  per MD  EDUCATION NEEDS:   No education needs identified at this time  Arthur Holms, RD, LDN Pager #: [phone] After-Hours Pager #: [phone]

## 2015-05-10 NOTE — Progress Notes (Signed)
TCTS BRIEF SICU PROGRESS NOTE  5 Days Post-Op S/P Procedure(s) (LRB): RESECTION RIGHT ANTERIOR CHEST WALL MASS WITH RECONSTRUCTION USING BARD MESH (Right)   1 Day Post-Op  S/P Procedure(s) (LRB): THORACOTOMY MAJOR (Right) EVACUATION HEMATOMA (Right)   Lightly sedated on vent NSR w/ stable BP O2 sats 93% on 60% FiO2 and PEEP=8 Diuresing very well Potassium 3.3 getting replaced  Plan: As outlined previously.  For return to OR tomorrow.  Rexene Alberts, MD 05/10/2015 6:48 PM

## 2015-05-10 NOTE — Progress Notes (Signed)
      [address]       [place] [address]             [phone]      Intubated, sedated  BP 112/51 mmHg  Pulse 79  Temp(Src) 100 F (37.8 C) (Oral)  Resp 24  Ht 6' (1.829 m)  Wt 299 lb 13.2 oz (136 kg)  BMI 40.65 kg/m2  SpO2 94%   Intake/Output Summary (Last 24 hours) at 05/10/15 1611 Last data filed at 05/10/15 1500  Gross per 24 hour  Intake 2574.01 ml  Output   3875 ml  Net -1300.99 ml   Has diuresed well with lasix. Still hypokalemic- supplement K, continue diuresis PO2 still low despite increase in PEEP, will increase FiO2 to 60% His hgb has dropped about a gram, may be some re-equilibration, bllod draws, etc. Will continue to follow closely and transfuse as needed  Will plan to take him back to the OR tomorrow for a VAC change and possible CW reconstruction. I doubt we will be able to reconstruct tomorrow, but will keep the option open.  Revonda Standard Roxan Hockey, MD [facility] [phone]

## 2015-05-11 ENCOUNTER — Inpatient Hospital Stay (HOSPITAL_COMMUNITY): Payer: Medicare HMO | Admitting: Anesthesiology

## 2015-05-11 ENCOUNTER — Encounter (HOSPITAL_COMMUNITY)
Admission: RE | Disposition: A | Discharge status: Rehabilitation Facility | Payer: Self-pay | Source: Ambulatory Visit | Attending: Thoracic Surgery (Cardiothoracic Vascular Surgery)

## 2015-05-11 ENCOUNTER — Inpatient Hospital Stay (HOSPITAL_COMMUNITY): Payer: Medicare HMO

## 2015-05-11 DIAGNOSIS — I9789 Other postprocedural complications and disorders of the circulatory system, not elsewhere classified: Secondary | ICD-10-CM

## 2015-05-11 HISTORY — PX: APPLICATION OF WOUND VAC: SHX5189

## 2015-05-11 HISTORY — PX: CHEST WALL RECONSTRUCTION: SHX5103

## 2015-05-11 LAB — GLUCOSE, CAPILLARY
GLUCOSE-CAPILLARY: 116 mg/dL — AB (ref 65–99)
Glucose-Capillary: 100 mg/dL — ABNORMAL HIGH (ref 65–99)
Glucose-Capillary: 93 mg/dL (ref 65–99)
Glucose-Capillary: 104 mg/dL — ABNORMAL HIGH (ref 65–99)

## 2015-05-11 LAB — POCT I-STAT 3, ART BLOOD GAS (G3+)
Acid-Base Excess: 8 mmol/L — ABNORMAL HIGH (ref 0.0–2.0)
Acid-Base Excess: 6 mmol/L — ABNORMAL HIGH (ref 0.0–2.0)
BICARBONATE: 32.6 meq/L — AB (ref 20.0–24.0)
BICARBONATE: 31.2 meq/L — AB (ref 20.0–24.0)
O2 Saturation: 91 %
O2 Saturation: 92 %
PCO2 ART: 43.7 mmHg (ref 35.0–45.0)
PCO2 ART: 46.5 mmHg — AB (ref 35.0–45.0)
PH ART: 7.434 (ref 7.350–7.450)
PO2 ART: 57 mmHg — AB (ref 80.0–100.0)
PO2 ART: 63 mmHg — AB (ref 80.0–100.0)
TCO2: 34 mmol/L (ref 0–100)
TCO2: 33 mmol/L (ref 0–100)
pH, Arterial: 7.481 — ABNORMAL HIGH (ref 7.350–7.450)

## 2015-05-11 LAB — TYPE AND SCREEN
ABO/RH(D): A NEG
ANTIBODY SCREEN: POSITIVE
DAT, IgG: NEGATIVE
DONOR AG TYPE: NEGATIVE
DONOR AG TYPE: NEGATIVE
DONOR AG TYPE: NEGATIVE
Donor AG Type: NEGATIVE
Donor AG Type: NEGATIVE
Donor AG Type: NEGATIVE
Donor AG Type: NEGATIVE
Donor AG Type: NEGATIVE
Donor AG Type: NEGATIVE
UNIT DIVISION: 0
UNIT DIVISION: 0
UNIT DIVISION: 0
UNIT DIVISION: 0
UNIT DIVISION: 0
Unit division: 0
Unit division: 0
Unit division: 0
Unit division: 0

## 2015-05-11 LAB — COMPREHENSIVE METABOLIC PANEL
ALBUMIN: 2.4 g/dL — AB (ref 3.5–5.0)
ALT: 14 U/L — ABNORMAL LOW (ref 17–63)
ANION GAP: 6 (ref 5–15)
AST: 25 U/L (ref 15–41)
Alkaline Phosphatase: 41 U/L (ref 38–126)
BILIRUBIN TOTAL: 1.1 mg/dL (ref 0.3–1.2)
BUN: 8 mg/dL (ref 6–20)
CHLORIDE: 105 mmol/L (ref 101–111)
CO2: 30 mmol/L (ref 22–32)
Calcium: 8 mg/dL — ABNORMAL LOW (ref 8.9–10.3)
Creatinine, Ser: 0.86 mg/dL (ref 0.61–1.24)
GFR calc Af Amer: >60 mL/min (ref >60)
GLUCOSE: 111 mg/dL — AB (ref 65–99)
POTASSIUM: 3.7 mmol/L (ref 3.5–5.1)
Sodium: 141 mmol/L (ref 135–145)
TOTAL PROTEIN: 5.3 g/dL — AB (ref 6.5–8.1)

## 2015-05-11 LAB — CBC
HEMATOCRIT: 29.6 % — AB (ref 39.0–52.0)
Hemoglobin: 9.8 g/dL — ABNORMAL LOW (ref 13.0–17.0)
MCH: 30 pg (ref 26.0–34.0)
MCHC: 33.1 g/dL (ref 30.0–36.0)
MCV: 90.5 fL (ref 78.0–100.0)
Platelets: 164 10*3/uL (ref 150–400)
RBC: 3.27 MIL/uL — ABNORMAL LOW (ref 4.22–5.81)
RDW: 15.4 % (ref 11.5–15.5)
WBC: 8.4 10*3/uL (ref 4.0–10.5)

## 2015-05-11 SURGERY — APPLICATION, WOUND VAC
Anesthesia: General | Site: Chest

## 2015-05-11 MED ORDER — ROCURONIUM BROMIDE 100 MG/10ML IV SOLN
INTRAVENOUS | Status: DC | PRN
  Administered 2015-05-11: 30 mg via INTRAVENOUS
  Administered 2015-05-11: 20 mg via INTRAVENOUS

## 2015-05-11 MED ORDER — 0.9 % SODIUM CHLORIDE (POUR BTL) OPTIME
TOPICAL | Status: DC | PRN
  Administered 2015-05-11: 1000 mL

## 2015-05-11 MED ORDER — FENTANYL CITRATE (PF) 250 MCG/5ML IJ SOLN
INTRAMUSCULAR | Status: AC
  Filled 2015-05-11: qty 5

## 2015-05-11 MED ORDER — VECURONIUM BROMIDE 10 MG IV SOLR
INTRAVENOUS | Status: DC | PRN
  Administered 2015-05-11: 2 mg via INTRAVENOUS

## 2015-05-11 MED ORDER — MIDAZOLAM HCL 2 MG/2ML IJ SOLN
INTRAMUSCULAR | Status: AC
  Filled 2015-05-11: qty 4

## 2015-05-11 MED ORDER — PHENYLEPHRINE HCL 10 MG/ML IJ SOLN
INTRAMUSCULAR | Status: DC | PRN
  Administered 2015-05-11: 40 ug via INTRAVENOUS
  Administered 2015-05-11: 80 ug via INTRAVENOUS

## 2015-05-11 MED ORDER — LACTATED RINGERS IV SOLN
INTRAVENOUS | Status: DC | PRN
  Administered 2015-05-11: 10:00:00 via INTRAVENOUS

## 2015-05-11 MED ORDER — VITAL HIGH PROTEIN PO LIQD
1000.0000 mL | ORAL | Status: DC
  Administered 2015-05-11: 1000 mL
  Filled 2015-05-11 (×4): qty 1000

## 2015-05-11 MED ORDER — HYDROMORPHONE HCL 1 MG/ML IJ SOLN: 0.2500 mg | INTRAMUSCULAR | Status: DC | PRN

## 2015-05-11 MED ORDER — FUROSEMIDE 10 MG/ML IJ SOLN
40.0000 mg | Freq: Once | INTRAMUSCULAR | Status: AC
  Administered 2015-05-11: 40 mg via INTRAVENOUS
  Filled 2015-05-11: qty 4

## 2015-05-11 MED ORDER — VANCOMYCIN HCL 1000 MG IV SOLR
1000.0000 mg | INTRAVENOUS | Status: DC | PRN
  Administered 2015-05-11: 1000 mg via INTRAVENOUS

## 2015-05-11 MED ORDER — PROMETHAZINE HCL 25 MG/ML IJ SOLN: 6.2500 mg | INTRAMUSCULAR | Status: DC | PRN

## 2015-05-11 MED ORDER — POTASSIUM CHLORIDE 10 MEQ/50ML IV SOLN
10.0000 meq | INTRAVENOUS | Status: AC | PRN
  Administered 2015-05-11 (×4): 10 meq via INTRAVENOUS

## 2015-05-11 MED ORDER — PRO-STAT SUGAR FREE PO LIQD
60.0000 mL | Freq: Three times a day (TID) | ORAL | Status: DC
  Administered 2015-05-11 – 2015-05-21 (×27): 60 mL
  Filled 2015-05-11 (×39): qty 60

## 2015-05-11 MED ORDER — POTASSIUM CHLORIDE 10 MEQ/50ML IV SOLN
10.0000 meq | INTRAVENOUS | Status: AC
  Administered 2015-05-11 (×4): 10 meq via INTRAVENOUS
  Filled 2015-05-11 (×2): qty 50

## 2015-05-11 MED ORDER — VANCOMYCIN HCL IN DEXTROSE 1-5 GM/200ML-% IV SOLN
1000.0000 mg | Freq: Once | INTRAVENOUS | Status: AC
  Administered 2015-05-11: 1000 mg via INTRAVENOUS
  Filled 2015-05-11: qty 200

## 2015-05-11 MED ORDER — FENTANYL CITRATE (PF) 100 MCG/2ML IJ SOLN
INTRAMUSCULAR | Status: DC | PRN
  Administered 2015-05-11: 150 ug via INTRAVENOUS
  Administered 2015-05-11: 50 ug via INTRAVENOUS

## 2015-05-11 MED ORDER — MIDAZOLAM HCL 5 MG/5ML IJ SOLN
INTRAMUSCULAR | Status: DC | PRN
  Administered 2015-05-11: 2 mg via INTRAVENOUS

## 2015-05-11 MED ORDER — PROPOFOL 10 MG/ML IV BOLUS
INTRAVENOUS | Status: AC
  Filled 2015-05-11: qty 20

## 2015-05-11 SURGICAL SUPPLY — 86 items
ADH SKN CLS APL DERMABOND .7 (GAUZE/BANDAGES/DRESSINGS) ×1
APL SRG 22X2 LUM MLBL SLNT (VASCULAR PRODUCTS)
APPLICATOR TIP EXT COSEAL (VASCULAR PRODUCTS) IMPLANT
APPLIER CLIP LOGIC TI 5 (MISCELLANEOUS) IMPLANT
APPLIER CLIP ROT 10 11.4 M/L (STAPLE)
APR CLP MED LRG 11.4X10 (STAPLE)
APR CLP MED LRG 33X5 (MISCELLANEOUS)
BIT DRILL 7/64X5 DISP (BIT) ×3 IMPLANT
BLADE OSCILLATING /SAGITTAL (BLADE) ×3 IMPLANT
BOWL SMART MIX CTS (DISPOSABLE) IMPLANT
CANISTER SUCTION 2500CC (MISCELLANEOUS) ×3 IMPLANT
CATH HYDRAGLIDE XL THORACIC (CATHETERS) IMPLANT
CATH THORACIC 28FR (CATHETERS) IMPLANT
CATH THORACIC 36FR (CATHETERS) IMPLANT
CATH THORACIC 36FR RT ANG (CATHETERS) IMPLANT
CLIP APPLIE ROT 10 11.4 M/L (STAPLE) IMPLANT
CLIP TI MEDIUM 24 (CLIP) ×2 IMPLANT
CLIP TI WIDE RED SMALL 24 (CLIP) ×2 IMPLANT
CONN ST 1/4X3/8  BEN (MISCELLANEOUS) ×4
CONN ST 1/4X3/8 BEN (MISCELLANEOUS) IMPLANT
CONN Y 3/8X3/8X3/8  BEN (MISCELLANEOUS)
CONN Y 3/8X3/8X3/8 BEN (MISCELLANEOUS) IMPLANT
CONT SPEC 4OZ CLIKSEAL STRL BL (MISCELLANEOUS) ×6 IMPLANT
COVER SURGICAL LIGHT HANDLE (MISCELLANEOUS) ×6 IMPLANT
DECANTER SPIKE VIAL GLASS SM (MISCELLANEOUS) ×3 IMPLANT
DERMABOND ADVANCED (GAUZE/BANDAGES/DRESSINGS) ×2
DERMABOND ADVANCED .7 DNX12 (GAUZE/BANDAGES/DRESSINGS) ×1 IMPLANT
DRAIN CHANNEL 28F RND 3/8 FF (WOUND CARE) ×4 IMPLANT
DRAPE CHEST BREAST 15X10 FENES (DRAPES) ×3 IMPLANT
DRSG VAC ATS LRG SENSATRAC (GAUZE/BANDAGES/DRESSINGS) ×2 IMPLANT
ELECT REM PT RETURN 9FT ADLT (ELECTROSURGICAL) ×3
ELECTRODE REM PT RTRN 9FT ADLT (ELECTROSURGICAL) ×1 IMPLANT
GAUZE IODOFORM PACK 1/2 7832 (GAUZE/BANDAGES/DRESSINGS) ×2 IMPLANT
GAUZE SPONGE 4X4 12PLY STRL (GAUZE/BANDAGES/DRESSINGS) ×3 IMPLANT
GAUZE XEROFORM 5X9 LF (GAUZE/BANDAGES/DRESSINGS) ×2 IMPLANT
GLOVE SURG SIGNA 7.5 PF LTX (GLOVE) ×3 IMPLANT
GOWN STRL REUS W/ TWL LRG LVL3 (GOWN DISPOSABLE) ×2 IMPLANT
GOWN STRL REUS W/ TWL XL LVL3 (GOWN DISPOSABLE) ×1 IMPLANT
GOWN STRL REUS W/TWL LRG LVL3 (GOWN DISPOSABLE) ×6
GOWN STRL REUS W/TWL XL LVL3 (GOWN DISPOSABLE) ×3
HEMOSTAT SURGICEL 2X14 (HEMOSTASIS) IMPLANT
KIT BASIN OR (CUSTOM PROCEDURE TRAY) ×3 IMPLANT
KIT ROOM TURNOVER OR (KITS) ×3 IMPLANT
KIT SUCTION CATH 14FR (SUCTIONS) ×2 IMPLANT
NS IRRIG 1000ML POUR BTL (IV SOLUTION) ×6 IMPLANT
PACK CHEST (CUSTOM PROCEDURE TRAY) ×3 IMPLANT
PAD ARMBOARD 7.5X6 YLW CONV (MISCELLANEOUS) ×6 IMPLANT
PAD NEG PRESSURE SENSATRAC (MISCELLANEOUS) ×2 IMPLANT
SAW GIGLI STERILE 20 (MISCELLANEOUS) ×3 IMPLANT
SEALANT PROGEL (MISCELLANEOUS) IMPLANT
SEALANT SURG COSEAL 4ML (VASCULAR PRODUCTS) IMPLANT
SEALANT SURG COSEAL 8ML (VASCULAR PRODUCTS) IMPLANT
SPECIMEN JAR MEDIUM (MISCELLANEOUS) ×3 IMPLANT
SPONGE GAUZE 4X4 12PLY STER LF (GAUZE/BANDAGES/DRESSINGS) ×2 IMPLANT
SPONGE TONSIL 1 RF SGL (DISPOSABLE) ×3 IMPLANT
STAPLER VISISTAT 35W (STAPLE) IMPLANT
SUT BONE WAX W31G (SUTURE) ×2 IMPLANT
SUT PROLENE 0 CT 1 CR/8 (SUTURE) IMPLANT
SUT PROLENE 1 CT (SUTURE) IMPLANT
SUT PROLENE 1 MO 6 (SUTURE) IMPLANT
SUT PROLENE 1 XLH 60 (SUTURE) IMPLANT
SUT PROLENE 2 0 CR (SUTURE) IMPLANT
SUT PROLENE 2 TP 1 (SUTURE) IMPLANT
SUT PROLENE 3 0 SH DA (SUTURE) IMPLANT
SUT PROLENE 4 0 RB 1 (SUTURE)
SUT PROLENE 4-0 RB1 .5 CRCL 36 (SUTURE) IMPLANT
SUT SILK  1 MH (SUTURE) ×8
SUT SILK 0 FSL (SUTURE) ×6 IMPLANT
SUT SILK 1 MH (SUTURE) IMPLANT
SUT SILK 2 0SH CR/8 30 (SUTURE) ×5 IMPLANT
SUT VIC AB 1 CTX 18 (SUTURE) ×3 IMPLANT
SUT VIC AB 1 CTX 36 (SUTURE) ×6
SUT VIC AB 1 CTX36XBRD ANBCTR (SUTURE) ×1 IMPLANT
SUT VIC AB 2-0 CTX 36 (SUTURE) ×3 IMPLANT
SUT VIC AB 3-0 MH 27 (SUTURE) IMPLANT
SUT VIC AB 3-0 X1 27 (SUTURE) ×3 IMPLANT
SUT VICRYL 2 TP 1 (SUTURE) ×3 IMPLANT
SYSTEM SAHARA CHEST DRAIN ATS (WOUND CARE) ×2 IMPLANT
SYSTEM SAHARA CHEST DRAIN RE-I (WOUND CARE) ×3 IMPLANT
TAPE CLOTH 4X10 WHT NS (GAUZE/BANDAGES/DRESSINGS) ×3 IMPLANT
TAPE CLOTH SURG 4X10 WHT LF (GAUZE/BANDAGES/DRESSINGS) ×2 IMPLANT
TAPE UMBILICAL COTTON 1/8X30 (MISCELLANEOUS) IMPLANT
TOWEL OR 17X26 10 PK STRL BLUE (TOWEL DISPOSABLE) ×3 IMPLANT
TRAY FOLEY W/METER SILVER 16FR (SET/KITS/TRAYS/PACK) ×3 IMPLANT
TUNNELER SHEATH ON-Q 11GX8 DSP (PAIN MANAGEMENT) IMPLANT
WATER STERILE IRR 1000ML POUR (IV SOLUTION) ×6 IMPLANT

## 2015-05-11 NOTE — Interval H&P Note (Signed)
History and Physical Interval Note:  05/11/2015 10:45 AM  Jerry [name]  has presented today for surgery, with the diagnosis of Sternal Wound Infection  The various methods of treatment have been discussed with the patient and family. After consideration of risks, benefits and other options for treatment, the patient has consented to  Procedure(s) with comments: APPLICATION OF WOUND VAC (N/A) - WOUND VAC CHANGE CHEST WALL RECONSTRUCTION (N/A) as a surgical intervention .  The patient's history has been reviewed, patient examined, no change in status, stable for surgery.  I have reviewed the patient's chart and labs.  Questions were answered to the patient's satisfaction.     [name]

## 2015-05-11 NOTE — Brief Op Note (Addendum)
05/05/2015 - 05/11/2015  12:00 PM  PATIENT:  Jerry [name]  64 y.o. male  PRE-OPERATIVE DIAGNOSIS:  Open Chest Wound, S/P take back for bleeding and VAC placement  POST-OPERATIVE DIAGNOSIS:  Open Chest Wound, S/P take back for bleeding and VAC placement  PROCEDURE:   WOUND VAC CHANGE TO CHEST WOUND  SURGEON:  [name], MD  ASSISTANT: Suzzanne Cloud, PA-C  ANESTHESIA:   general  DRAINS: 56 Blake drains x 2  PATIENT CONDITION:  ICU - intubated and hemodynamically stable.

## 2015-05-11 NOTE — Transfer of Care (Signed)
Immediate Anesthesia Transfer of Care Note  Patient: Jerry [name]  Procedure(s) Performed: Procedure(s) with comments: APPLICATION OF WOUND VAC (N/A) - WOUND VAC CHANGE CHEST WALL RECONSTRUCTION (N/A)  Patient Location: SICU  Anesthesia Type:General  Level of Consciousness: sedated and Patient remains intubated per anesthesia plan  Airway & Oxygen Therapy: Patient remains intubated per anesthesia plan and Patient placed on Ventilator (see vital sign flow sheet for setting)  Post-op Assessment: Report given to RN  Post vital signs: Reviewed and stable  Last Vitals:  Filed Vitals:   05/11/15 1000  BP: 145/58  Pulse: 84  Temp:   Resp: 18    Complications: No apparent anesthesia complications

## 2015-05-11 NOTE — Progress Notes (Signed)
Dr. Roxan Hockey called and made aware of chest tube and wound vac leak, orders to take CT off suction and reduce suction of wound vac to 75.  Rowe Pavy, RN

## 2015-05-11 NOTE — H&P (View-Only) (Signed)
2 Days Post-Op Procedure(s) (LRB): THORACOTOMY MAJOR (Right) EVACUATION HEMATOMA (Right) Subjective: Intubated, sedated. Has been fighting the vent more this morning  Objective: Vital signs in last 24 hours: Temp:  [98.1 F (36.7 C)-100.6 F (38.1 C)] 100.6 F (38.1 C) (10/05 0745) Pulse Rate:  [62-85] 71 (10/05 0700) Cardiac Rhythm:  [-] Normal sinus rhythm (10/05 0809) Resp:  [16-31] 17 (10/05 0700) BP: (112)/(51) 112/51 mmHg (10/04 1545) SpO2:  [90 %-99 %] 96 % (10/05 0700) Arterial Line BP: (80-139)/(40-67) 108/54 mmHg (10/05 0700) FiO2 (%):  [50 %-60 %] 60 % (10/05 0400) Weight:  [296 lb 11.8 oz (134.6 kg)] 296 lb 11.8 oz (134.6 kg) (10/05 0500)  Hemodynamic parameters for last 24 hours:    Intake/Output from previous day: 10/04 0701 - 10/05 0700 In: 2925.9 [I.V.:2065.9; NG/GT:360; IV Piggyback:500] Out: 0370 [Urine:3970; Emesis/NG output:540; Chest Tube:280] Intake/Output this shift:    General appearance: mild distress Neurologic: no focal deficit Heart: regular rate and rhythm Lungs: rhonchi bilaterally Wound: VAC in place  Lab Results:  Recent Labs  05/10/15 2100 05/10/15 2108 05/11/15 0400  WBC 7.8  --  8.4  HGB 9.4* 9.5* 9.8*  HCT 28.2* 28.0* 29.6*  PLT 166  --  164   BMET:  Recent Labs  05/10/15 1520 05/10/15 1525 05/10/15 2108 05/11/15 0400  NA 139 142 141 141  K 3.2* 3.3* 3.8 3.7  CL 100* 99*  --  105  CO2 33*  --   --  30  GLUCOSE 120* 126* 108* 111*  BUN 7 9  --  8  CREATININE 0.92 0.90  --  0.86  CALCIUM 7.9*  --   --  8.0*    PT/INR:  Recent Labs  05/09/15 0434  LABPROT 18.5*  INR 1.54*   ABG    Component Value Date/Time   PHART 7.481* 05/11/2015 0823   HCO3 32.6* 05/11/2015 0823   TCO2 34 05/11/2015 0823   O2SAT 91.0 05/11/2015 0823   CBG (last 3)   Recent Labs  05/10/15 2353 05/11/15 0345 05/11/15 0743  GLUCAP 99 100* 93    Assessment/Plan: S/P Procedure(s) (LRB): THORACOTOMY MAJOR (Right) EVACUATION  HEMATOMA (Right) -  Low grade temp this AM- will give Vanco prior to VAC change just in case I can reconstruct today CV- stable RESP- pO2 still relatively low. CXR doesn't look that bad- has some basilar atelectasis. Will need additional diuresis, but will hold off until after OR RENAL- see above GI Panda in stomach start TF post OR  Anemia secondary to ABL- stable Hgb To OR this AM for VAC change, possible CW reconstruction. Family is aware of plan and associated risks and agrees   LOS: 6 days    [name] 05/11/2015

## 2015-05-11 NOTE — Anesthesia Preprocedure Evaluation (Addendum)
Anesthesia Evaluation  Patient identified by MRN, date of birth, ID band Patient unresponsive    Reviewed: Allergy & Precautions, NPO status , Patient's Chart, lab work & pertinent test results  History of Anesthesia Complications Negative for: history of anesthetic complications  Airway Mallampati: Intubated       Dental   Pulmonary Current Smoker,  Hypoxia on recent ABG    + decreased breath sounds      Cardiovascular hypertension, + CAD   Rhythm:Regular Rate:Normal     Neuro/Psych PSYCHIATRIC DISORDERS Anxiety Depression negative neurological ROS     GI/Hepatic negative GI ROS, Neg liver ROS,   Endo/Other  negative endocrine ROS  Renal/GU negative Renal ROS     Musculoskeletal   Abdominal   Peds  Hematology   Anesthesia Other Findings   Reproductive/Obstetrics                            Anesthesia Physical Anesthesia Plan  ASA: IV  Anesthesia Plan: General   Post-op Pain Management:    Induction: Intravenous  Airway Management Planned: Oral ETT  Additional Equipment:   Intra-op Plan:   Post-operative Plan: Post-operative intubation/ventilation  Informed Consent: I have reviewed the patients History and Physical, chart, labs and discussed the procedure including the risks, benefits and alternatives for the proposed anesthesia with the patient or authorized representative who has indicated his/her understanding and acceptance.   Dental advisory given  Plan Discussed with: CRNA, Anesthesiologist and Surgeon  Anesthesia Plan Comments:         Anesthesia Quick Evaluation

## 2015-05-11 NOTE — Progress Notes (Signed)
CT surgery p.m. Rounds  Patient resting comfortably on ventilator after return to the OR for wound VAC change and wound debridement Minimal drainage from chest tube, minimal drainage from wound VAC Hemodynamic stable

## 2015-05-11 NOTE — Progress Notes (Signed)
RT note- Ventilator on standby, patient in the OR

## 2015-05-11 NOTE — Progress Notes (Signed)
2 Days Post-Op Procedure(s) (LRB): THORACOTOMY MAJOR (Right) EVACUATION HEMATOMA (Right) Subjective: Intubated, sedated. Has been fighting the vent more this morning  Objective: Vital signs in last 24 hours: Temp:  [98.1 F (36.7 C)-100.6 F (38.1 C)] 100.6 F (38.1 C) (10/05 0745) Pulse Rate:  [62-85] 71 (10/05 0700) Cardiac Rhythm:  [-] Normal sinus rhythm (10/05 0809) Resp:  [16-31] 17 (10/05 0700) BP: (112)/(51) 112/51 mmHg (10/04 1545) SpO2:  [90 %-99 %] 96 % (10/05 0700) Arterial Line BP: (80-139)/(40-67) 108/54 mmHg (10/05 0700) FiO2 (%):  [50 %-60 %] 60 % (10/05 0400) Weight:  [296 lb 11.8 oz (134.6 kg)] 296 lb 11.8 oz (134.6 kg) (10/05 0500)  Hemodynamic parameters for last 24 hours:    Intake/Output from previous day: 10/04 0701 - 10/05 0700 In: 2925.9 [I.V.:2065.9; NG/GT:360; IV Piggyback:500] Out: 3762 [Urine:3970; Emesis/NG output:540; Chest Tube:280] Intake/Output this shift:    General appearance: mild distress Neurologic: no focal deficit Heart: regular rate and rhythm Lungs: rhonchi bilaterally Wound: VAC in place  Lab Results:  Recent Labs  05/10/15 2100 05/10/15 2108 05/11/15 0400  WBC 7.8  --  8.4  HGB 9.4* 9.5* 9.8*  HCT 28.2* 28.0* 29.6*  PLT 166  --  164   BMET:  Recent Labs  05/10/15 1520 05/10/15 1525 05/10/15 2108 05/11/15 0400  NA 139 142 141 141  K 3.2* 3.3* 3.8 3.7  CL 100* 99*  --  105  CO2 33*  --   --  30  GLUCOSE 120* 126* 108* 111*  BUN 7 9  --  8  CREATININE 0.92 0.90  --  0.86  CALCIUM 7.9*  --   --  8.0*    PT/INR:  Recent Labs  05/09/15 0434  LABPROT 18.5*  INR 1.54*   ABG    Component Value Date/Time   PHART 7.481* 05/11/2015 0823   HCO3 32.6* 05/11/2015 0823   TCO2 34 05/11/2015 0823   O2SAT 91.0 05/11/2015 0823   CBG (last 3)   Recent Labs  05/10/15 2353 05/11/15 0345 05/11/15 0743  GLUCAP 99 100* 93    Assessment/Plan: S/P Procedure(s) (LRB): THORACOTOMY MAJOR (Right) EVACUATION  HEMATOMA (Right) -  Low grade temp this AM- will give Vanco prior to VAC change just in case I can reconstruct today CV- stable RESP- pO2 still relatively low. CXR doesn't look that bad- has some basilar atelectasis. Will need additional diuresis, but will hold off until after OR RENAL- see above GI Panda in stomach start TF post OR  Anemia secondary to ABL- stable Hgb To OR this AM for VAC change, possible CW reconstruction. Family is aware of plan and associated risks and agrees   LOS: 6 days    [name] 05/11/2015

## 2015-05-11 NOTE — Anesthesia Postprocedure Evaluation (Signed)
Anesthesia Post Note  Patient: Jerry [name]  Procedure(s) Performed: Procedure(s) (LRB): APPLICATION OF WOUND VAC (N/A) CHEST WALL RECONSTRUCTION (N/A)  Anesthesia type: General  Patient location: ICU  Post pain: Pain level controlled  Post assessment: Post-op Vital signs reviewed  Last Vitals:  Filed Vitals:   05/11/15 1400  BP: 102/51  Pulse: 70  Temp:   Resp: 10    Post vital signs: stable  Level of consciousness: Patient remains intubated per anesthesia plan  Complications: No apparent anesthesia complications

## 2015-05-12 ENCOUNTER — Inpatient Hospital Stay (HOSPITAL_COMMUNITY): Payer: Medicare HMO

## 2015-05-12 ENCOUNTER — Encounter (HOSPITAL_COMMUNITY): Payer: Self-pay | Admitting: Thoracic Surgery (Cardiothoracic Vascular Surgery)

## 2015-05-12 LAB — GLUCOSE, CAPILLARY
GLUCOSE-CAPILLARY: 123 mg/dL — AB (ref 65–99)
GLUCOSE-CAPILLARY: 117 mg/dL — AB (ref 65–99)
Glucose-Capillary: 110 mg/dL — ABNORMAL HIGH (ref 65–99)
Glucose-Capillary: 111 mg/dL — ABNORMAL HIGH (ref 65–99)
Glucose-Capillary: 112 mg/dL — ABNORMAL HIGH (ref 65–99)
Glucose-Capillary: 128 mg/dL — ABNORMAL HIGH (ref 65–99)

## 2015-05-12 LAB — CULTURE, BLOOD (ROUTINE X 2)
Culture: NO GROWTH
Culture: NO GROWTH

## 2015-05-12 LAB — TRIGLYCERIDES: TRIGLYCERIDES: 173 mg/dL — AB (ref <150)

## 2015-05-12 MED ORDER — VITAL HIGH PROTEIN PO LIQD
1000.0000 mL | ORAL | Status: DC
  Administered 2015-05-12 – 2015-05-18 (×6): 1000 mL
  Filled 2015-05-12 (×11): qty 1000

## 2015-05-12 MED ORDER — DEXTROSE 5 % IV SOLN: 1.0000 g | Freq: Two times a day (BID) | INTRAVENOUS | Status: DC

## 2015-05-12 MED ORDER — POTASSIUM CHLORIDE 20 MEQ/15ML (10%) PO SOLN
40.0000 meq | Freq: Two times a day (BID) | ORAL | Status: AC
  Administered 2015-05-12 (×2): 40 meq via ORAL
  Filled 2015-05-12 (×2): qty 30

## 2015-05-12 MED ORDER — FUROSEMIDE 10 MG/ML IJ SOLN
40.0000 mg | Freq: Once | INTRAMUSCULAR | Status: AC
  Administered 2015-05-12: 40 mg via INTRAVENOUS
  Filled 2015-05-12: qty 4

## 2015-05-12 MED ORDER — DEXTROSE 5 % IV SOLN
2.0000 g | Freq: Three times a day (TID) | INTRAVENOUS | Status: AC
  Administered 2015-05-12 – 2015-05-19 (×22): 2 g via INTRAVENOUS
  Filled 2015-05-12 (×23): qty 2

## 2015-05-12 MED ORDER — ENOXAPARIN SODIUM 40 MG/0.4ML ~~LOC~~ SOLN
40.0000 mg | SUBCUTANEOUS | Status: DC
  Administered 2015-05-12 – 2015-05-19 (×7): 40 mg via SUBCUTANEOUS
  Filled 2015-05-12 (×11): qty 0.4

## 2015-05-12 NOTE — Progress Notes (Signed)
1 Day Post-Op Procedure(s) (LRB): APPLICATION OF WOUND VAC (N/A) CHEST WALL RECONSTRUCTION (N/A) Subjective: Intubated Trying to communicate Follows commands  Objective: Vital signs in last 24 hours: Temp:  [99.1 F (37.3 C)-99.4 F (37.4 C)] 99.1 F (37.3 C) (10/06 0400) Pulse Rate:  [58-84] 69 (10/06 0700) Cardiac Rhythm:  [-] Sinus bradycardia (10/06 0400) Resp:  [10-28] 17 (10/06 0700) BP: (89-145)/(44-58) 100/49 mmHg (10/06 0600) SpO2:  [94 %-100 %] 95 % (10/06 0700) Arterial Line BP: (70-162)/(42-73) 140/60 mmHg (10/06 0700) FiO2 (%):  [60 %] 60 % (10/06 0740) Weight:  [292 lb 12.3 oz (132.8 kg)] 292 lb 12.3 oz (132.8 kg) (10/06 0600)  Hemodynamic parameters for last 24 hours:    Intake/Output from previous day: 10/05 0701 - 10/06 0700 In: 4150 [I.V.:2884.7; NG/GT:565.3; IV Piggyback:700] Out: 2685 [Urine:2200; Emesis/NG output:100; Drains:300; Blood:75; Chest Tube:10] Intake/Output this shift:    General appearance: cooperative and no distress Neurologic: nonfocal Heart: regular rate and rhythm Lungs: wheezes faint bilaterally Abdomen: normal findings: soft, non-tender Wound: no air leak, VAC in place  Lab Results:  Recent Labs  05/10/15 2100 05/10/15 2108 05/11/15 0400  WBC 7.8  --  8.4  HGB 9.4* 9.5* 9.8*  HCT 28.2* 28.0* 29.6*  PLT 166  --  164   BMET:  Recent Labs  05/10/15 1520 05/10/15 1525 05/10/15 2108 05/11/15 0400  NA 139 142 141 141  K 3.2* 3.3* 3.8 3.7  CL 100* 99*  --  105  CO2 33*  --   --  30  GLUCOSE 120* 126* 108* 111*  BUN 7 9  --  8  CREATININE 0.92 0.90  --  0.86  CALCIUM 7.9*  --   --  8.0*    PT/INR: No results for input(s): LABPROT, INR in the last 72 hours. ABG    Component Value Date/Time   PHART 7.434 05/11/2015 1353   HCO3 31.2* 05/11/2015 1353   TCO2 33 05/11/2015 1353   O2SAT 92.0 05/11/2015 1353   CBG (last 3)   Recent Labs  05/11/15 1944 05/11/15 2345 05/12/15 0344  GLUCAP 116* 128* 111*     Assessment/Plan: S/P Procedure(s) (LRB): APPLICATION OF WOUND VAC (N/A) CHEST WALL RECONSTRUCTION (N/A) -  CV- stable  RESP- VDRF secondary to bleeding after CW resection- will attempt to wean today, but I'm not overly optimistic about chances for extubation  RENAL- continue diuresis- supplement K  ENDO- CBG well controlled  GI- tolerating TF, advance to goal of 30 ml/ HR  SCD for DVT prophylaxis, no active bleeding for several days, will resume enoxparin  Pain control- on high dose of fentanyl   LOS: 7 days    [name] 05/12/2015  ADDENDUM  GNR in sputum and having fever this afternoon- will start Rocephin 1 gm IV q 12  Shekita Boyden C. Roxan Hockey, MD [facility] [phone]

## 2015-05-12 NOTE — Op Note (Signed)
Jerry [name], Jerry [name]              ACCOUNT NO.:  [id number]  MEDICAL RECORD NO.:  [id number]  LOCATION:  2S04C                        FACILITY:  [facility]  PHYSICIAN:  Revonda Standard. Roxan Hockey, M.D.DATE OF BIRTH:  1950-08-19  DATE OF PROCEDURE:  05/11/2015 DATE OF DISCHARGE:                              OPERATIVE REPORT   PREOPERATIVE DIAGNOSIS:  Open chest wound following chest wall resection.  POSTOPERATIVE DIAGNOSIS:  Open chest wound following chest wall resection.  PROCEDURE:  Wound VAC change, right chest.  SURGEON:  Revonda Standard. Roxan Hockey, M.D.  ASSISTANT:  Suzzanne Cloud, P.A.  ANESTHESIA:  General.  FINDINGS:  Wound clean.  Minimal necrotic subcutaneous tissue along inferior skin edge was debrided back to viable tissue.  Tissues too edematous to reapproximate muscle, therefore no reconstruction was attempted.  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  He did well initially after surgery, but on postoperative day #5, had to be taken back to the operating room for bleeding.  The polypropylene-methylmethacrylate prosthesis had to be removed.  No definite bleeding site was identified.  Esmarch was applied over the wound and a VAC was placed.  The patient now needs wound VAC change and assessment to see if reconstruction is feasible. Indications, risks, benefits, and alternatives were discussed with the patient's family.  They understood the risks and agreed to proceed.  OPERATIVE NOTE:  Jerry [name] was brought from the surgical ICU to the operating room on May 11, 2015.  He was already intubated.  He had induction of general anesthesia.  Intravenous vancomycin was administered.  The VAC was removed.  The chest and abdomen then were prepped and draped in usual sterile fashion.  The Esmarch dressing was removed.  There were 4 chest tubes in place.  These were removed.   There was no significant hematoma present.  The tissue was viable for the most part, although there was a small area of necrotic subcutaneous tissue along the inferior skin edge.  This was gently debrided back to viable tissue.  Hemostasis was achieved with electrocautery.  There was still significant edema in the tissues of the chest wall, and it did not appear that the chest wall musculature would be suitable for closure over a prosthesis.  Therefore, the wound was copiously irrigated with warm saline.  Two 28-French Blake drains were placed through to the original chest tube sites.  After making a final inspection for hemostasis, the wound VAC sponge was cut to fit the wound.  The remaining 2 chest tube sites were packed with iodoform Nu Gauze.  The VAC dressing was applied and vacuum was turned on.  There was no significant leak.  The patient then was taken from the operating room back to the surgical intensive care unit in good condition.     Revonda Standard Roxan Hockey, M.D.     SCH/MEDQ  D:  05/11/2015  T:  05/12/2015  Job:  [id number]

## 2015-05-13 ENCOUNTER — Inpatient Hospital Stay (HOSPITAL_COMMUNITY): Payer: Medicare HMO

## 2015-05-13 ENCOUNTER — Inpatient Hospital Stay (HOSPITAL_COMMUNITY): Payer: Medicare HMO | Admitting: Anesthesiology

## 2015-05-13 ENCOUNTER — Encounter (HOSPITAL_COMMUNITY)
Admission: RE | Disposition: A | Discharge status: Rehabilitation Facility | Payer: Self-pay | Source: Ambulatory Visit | Attending: Thoracic Surgery (Cardiothoracic Vascular Surgery)

## 2015-05-13 ENCOUNTER — Encounter (HOSPITAL_COMMUNITY): Payer: Self-pay | Admitting: Anesthesiology

## 2015-05-13 DIAGNOSIS — I9789 Other postprocedural complications and disorders of the circulatory system, not elsewhere classified: Secondary | ICD-10-CM

## 2015-05-13 HISTORY — PX: APPLICATION OF WOUND VAC: SHX5189

## 2015-05-13 LAB — TYPE AND SCREEN
ABO/RH(D): A NEG
Antibody Screen: POSITIVE
DAT, IGG: NEGATIVE
DONOR AG TYPE: NEGATIVE
Donor AG Type: NEGATIVE
UNIT DIVISION: 0
Unit division: 0

## 2015-05-13 LAB — GLUCOSE, CAPILLARY
GLUCOSE-CAPILLARY: 108 mg/dL — AB (ref 65–99)
GLUCOSE-CAPILLARY: 92 mg/dL (ref 65–99)
Glucose-Capillary: 101 mg/dL — ABNORMAL HIGH (ref 65–99)
Glucose-Capillary: 119 mg/dL — ABNORMAL HIGH (ref 65–99)
Glucose-Capillary: 133 mg/dL — ABNORMAL HIGH (ref 65–99)
Glucose-Capillary: 89 mg/dL (ref 65–99)
Glucose-Capillary: 90 mg/dL (ref 65–99)

## 2015-05-13 LAB — CBC
HEMATOCRIT: 29.4 % — AB (ref 39.0–52.0)
HEMOGLOBIN: 9.4 g/dL — AB (ref 13.0–17.0)
MCH: 29.8 pg (ref 26.0–34.0)
MCHC: 32 g/dL (ref 30.0–36.0)
MCV: 93.3 fL (ref 78.0–100.0)
Platelets: 215 10*3/uL (ref 150–400)
RBC: 3.15 MIL/uL — AB (ref 4.22–5.81)
RDW: 14.9 % (ref 11.5–15.5)
WBC: 10.2 10*3/uL (ref 4.0–10.5)

## 2015-05-13 LAB — BASIC METABOLIC PANEL
ANION GAP: 9 (ref 5–15)
BUN: 16 mg/dL (ref 6–20)
CHLORIDE: 102 mmol/L (ref 101–111)
CO2: 29 mmol/L (ref 22–32)
Calcium: 8 mg/dL — ABNORMAL LOW (ref 8.9–10.3)
Creatinine, Ser: 0.82 mg/dL (ref 0.61–1.24)
GFR calc Af Amer: >60 mL/min (ref >60)
Glucose, Bld: 124 mg/dL — ABNORMAL HIGH (ref 65–99)
POTASSIUM: 3.6 mmol/L (ref 3.5–5.1)
SODIUM: 140 mmol/L (ref 135–145)

## 2015-05-13 LAB — CULTURE, RESPIRATORY

## 2015-05-13 LAB — CULTURE, RESPIRATORY W GRAM STAIN

## 2015-05-13 SURGERY — APPLICATION, WOUND VAC
Anesthesia: General | Site: Chest

## 2015-05-13 MED ORDER — NICOTINE 21 MG/24HR TD PT24: 21.0000 mg | MEDICATED_PATCH | Freq: Every day | TRANSDERMAL | Status: DC

## 2015-05-13 MED ORDER — ROCURONIUM BROMIDE 50 MG/5ML IV SOLN
INTRAVENOUS | Status: AC
  Filled 2015-05-13: qty 1

## 2015-05-13 MED ORDER — LACTATED RINGERS IV SOLN
INTRAVENOUS | Status: DC | PRN
  Administered 2015-05-13: 14:00:00 via INTRAVENOUS

## 2015-05-13 MED ORDER — POTASSIUM CHLORIDE 10 MEQ/50ML IV SOLN
10.0000 meq | INTRAVENOUS | Status: AC
  Administered 2015-05-13 (×3): 10 meq via INTRAVENOUS
  Filled 2015-05-13 (×3): qty 50

## 2015-05-13 MED ORDER — FENTANYL CITRATE (PF) 250 MCG/5ML IJ SOLN
INTRAMUSCULAR | Status: AC
  Filled 2015-05-13: qty 5

## 2015-05-13 MED ORDER — MIDAZOLAM HCL 2 MG/2ML IJ SOLN
INTRAMUSCULAR | Status: AC
  Filled 2015-05-13: qty 4

## 2015-05-13 MED ORDER — ROCURONIUM BROMIDE 100 MG/10ML IV SOLN
INTRAVENOUS | Status: DC | PRN
  Administered 2015-05-13: 25 mg via INTRAVENOUS

## 2015-05-13 MED ORDER — PROPOFOL 10 MG/ML IV BOLUS
INTRAVENOUS | Status: AC
  Filled 2015-05-13: qty 20

## 2015-05-13 MED ORDER — MIDAZOLAM HCL 5 MG/5ML IJ SOLN
INTRAMUSCULAR | Status: DC | PRN
  Administered 2015-05-13: 2 mg via INTRAVENOUS

## 2015-05-13 MED ORDER — FUROSEMIDE 10 MG/ML IJ SOLN
40.0000 mg | Freq: Two times a day (BID) | INTRAMUSCULAR | Status: DC
  Administered 2015-05-13 – 2015-05-15 (×6): 40 mg via INTRAVENOUS
  Filled 2015-05-13 (×10): qty 4

## 2015-05-13 MED ORDER — FENTANYL CITRATE (PF) 100 MCG/2ML IJ SOLN
INTRAMUSCULAR | Status: DC | PRN
  Administered 2015-05-13: 50 ug via INTRAVENOUS

## 2015-05-13 MED ORDER — 0.9 % SODIUM CHLORIDE (POUR BTL) OPTIME
TOPICAL | Status: DC | PRN
  Administered 2015-05-13: 1000 mL

## 2015-05-13 SURGICAL SUPPLY — 42 items
BLADE SURG 10 STRL SS (BLADE) ×3 IMPLANT
CANISTER SUCTION 2500CC (MISCELLANEOUS) ×3 IMPLANT
CONT SPEC 4OZ CLIKSEAL STRL BL (MISCELLANEOUS) IMPLANT
DRAPE LAPAROSCOPIC ABDOMINAL (DRAPES) ×3 IMPLANT
DRAPE SLUSH/WARMER DISC (DRAPES) IMPLANT
DRSG ADAPTIC 3X8 NADH LF (GAUZE/BANDAGES/DRESSINGS) ×2 IMPLANT
DRSG PAD ABDOMINAL 8X10 ST (GAUZE/BANDAGES/DRESSINGS) IMPLANT
DRSG VAC ATS LRG SENSATRAC (GAUZE/BANDAGES/DRESSINGS) ×2 IMPLANT
ELECT REM PT RETURN 9FT ADLT (ELECTROSURGICAL) ×3
ELECTRODE REM PT RTRN 9FT ADLT (ELECTROSURGICAL) ×1 IMPLANT
GAUZE IODOFORM PACK 1/2 7832 (GAUZE/BANDAGES/DRESSINGS) ×2 IMPLANT
GAUZE SPONGE 4X4 12PLY STRL (GAUZE/BANDAGES/DRESSINGS) ×1 IMPLANT
GLOVE EUDERMIC 7 POWDERFREE (GLOVE) ×6 IMPLANT
GOWN STRL REUS W/ TWL LRG LVL3 (GOWN DISPOSABLE) ×2 IMPLANT
GOWN STRL REUS W/ TWL XL LVL3 (GOWN DISPOSABLE) ×1 IMPLANT
GOWN STRL REUS W/TWL LRG LVL3 (GOWN DISPOSABLE)
GOWN STRL REUS W/TWL XL LVL3 (GOWN DISPOSABLE) ×3
HANDPIECE INTERPULSE COAX TIP (DISPOSABLE)
HEMOSTAT POWDER SURGIFOAM 1G (HEMOSTASIS) IMPLANT
KIT BASIN OR (CUSTOM PROCEDURE TRAY) ×3 IMPLANT
KIT ROOM TURNOVER OR (KITS) ×3 IMPLANT
NS IRRIG 1000ML POUR BTL (IV SOLUTION) ×6 IMPLANT
PACK CHEST (CUSTOM PROCEDURE TRAY) ×1 IMPLANT
PACK GENERAL/GYN (CUSTOM PROCEDURE TRAY) ×2 IMPLANT
PAD ARMBOARD 7.5X6 YLW CONV (MISCELLANEOUS) ×6 IMPLANT
PAD NEG PRESSURE SENSATRAC (MISCELLANEOUS) ×2 IMPLANT
SET HNDPC FAN SPRY TIP SCT (DISPOSABLE) IMPLANT
SPONGE LAP 18X18 X RAY DECT (DISPOSABLE) ×1 IMPLANT
SUT STEEL 6MS V (SUTURE) IMPLANT
SUT STEEL STERNAL CCS#1 18IN (SUTURE) IMPLANT
SUT STEEL SZ 6 DBL 3X14 BALL (SUTURE) IMPLANT
SUT VIC AB 1 CTX 36 (SUTURE)
SUT VIC AB 1 CTX36XBRD ANBCTR (SUTURE) ×2 IMPLANT
SUT VIC AB 2-0 CTX 27 (SUTURE) ×2 IMPLANT
SUT VIC AB 3-0 X1 27 (SUTURE) ×2 IMPLANT
SWAB COLLECTION DEVICE MRSA (MISCELLANEOUS) IMPLANT
SYR 5ML LL (SYRINGE) IMPLANT
TOWEL OR 17X24 6PK STRL BLUE (TOWEL DISPOSABLE) ×3 IMPLANT
TOWEL OR 17X26 10 PK STRL BLUE (TOWEL DISPOSABLE) ×3 IMPLANT
TRAY FOLEY CATH 14FRSI W/METER (CATHETERS) IMPLANT
TUBE ANAEROBIC SPECIMEN COL (MISCELLANEOUS) IMPLANT
WATER STERILE IRR 1000ML POUR (IV SOLUTION) ×3 IMPLANT

## 2015-05-13 NOTE — Progress Notes (Signed)
Patient ID: Jerry [name], male   DOB: 01-30-1951, 64 y.o.   MRN: [id number]  SICU Evening Rounds:  Hemodynamically stable today on the vent  Good diuresis -1700 cc so far.

## 2015-05-13 NOTE — Transfer of Care (Signed)
Immediate Anesthesia Transfer of Care Note  Patient: Jerry [name]  Procedure(s) Performed: Procedure(s): Chest wall WOUND VAC CHANGE and removal of chest tubes (N/A)  Patient Location: SICU  Anesthesia Type:General  Level of Consciousness: sedated and Patient remains intubated per anesthesia plan  Airway & Oxygen Therapy: Patient remains intubated per anesthesia plan and Patient placed on Ventilator (see vital sign flow sheet for setting)  Post-op Assessment: Report given to RN and Post -op Vital signs reviewed and stable  Post vital signs: Reviewed and stable  Last Vitals:  Filed Vitals:   05/13/15 1400  BP:   Pulse: 75  Temp:   Resp: 19    Complications: No apparent anesthesia complications

## 2015-05-13 NOTE — Anesthesia Postprocedure Evaluation (Signed)
  Anesthesia Post-op Note  Patient: Jerry [name]  Procedure(s) Performed: Procedure(s): Chest wall WOUND VAC CHANGE and removal of chest tubes (N/A)  Patient Location: SICU  Anesthesia Type:General  Level of Consciousness: sedated  Airway and Oxygen Therapy: Patient remains intubated per anesthesia plan and Patient placed on Ventilator (see vital sign flow sheet for setting)  Post-op Pain: mild  Post-op Assessment: Post-op Vital signs reviewed LLE Motor Response: Purposeful movement, Responds to commands   RLE Motor Response: Purposeful movement, Responds to commands        Post-op Vital Signs: Reviewed  Last Vitals:  Filed Vitals:   05/13/15 2100  BP:   Pulse:   Temp: 36.5 C  Resp:     Complications: No apparent anesthesia complications

## 2015-05-13 NOTE — Significant Event (Addendum)
Patient taken to OR at 1409pm for wound vac replacement. Bedside report given to receiving CRNA who came to get patient.  Bedside report also given to receiving RN, Gabb who will assume patient care after patient is returned to room from [facility].

## 2015-05-13 NOTE — Brief Op Note (Signed)
05/05/2015 - 05/13/2015  3:34 PM  PATIENT:  Jerry [name]  64 y.o. male  PRE-OPERATIVE DIAGNOSIS:  OPEN CHEST WOUND  POST-OPERATIVE DIAGNOSIS:  OPEN CHEST WOUND   PROCEDURE:  Procedure(s): Chest wall WOUND VAC CHANGE and removal of chest tubes (N/A)  SURGEON:  Surgeon(s) and Role:    * [name], MD - Primary  ASSISTANTS: none   ANESTHESIA:   general  EBL:  Total I/O In: 685 [I.V.:425; NG/GT:60; IV Piggyback:200] Out: 1630 [Urine:1455; Emesis/NG output:100; Drains:75]  BLOOD ADMINISTERED:none  DRAINS: VAC right chest wall   LOCAL MEDICATIONS USED:  NONE  SPECIMEN:  No Specimen  DISPOSITION OF SPECIMEN:  N/A  COUNTS:  YES   PLAN OF CARE: return to SICU  PATIENT DISPOSITION:  ICU - intubated and hemodynamically stable.   Delay start of Pharmacological VTE agent (>24hrs) due to surgical blood loss or risk of bleeding: not applicable

## 2015-05-13 NOTE — Anesthesia Preprocedure Evaluation (Signed)
Anesthesia Evaluation  Patient identified by MRN, date of birth, ID band Patient unresponsive    Reviewed: Allergy & Precautions, NPO status , Patient's Chart, lab work & pertinent test results  History of Anesthesia Complications Negative for: history of anesthetic complications  Airway Mallampati: Intubated       Dental   Pulmonary Current Smoker,  Hypoxia on recent ABG    + decreased breath sounds  + intubated    Cardiovascular hypertension, + CAD   Rhythm:Regular Rate:Normal     Neuro/Psych PSYCHIATRIC DISORDERS Anxiety Depression negative neurological ROS     GI/Hepatic negative GI ROS, Neg liver ROS,   Endo/Other  negative endocrine ROS  Renal/GU negative Renal ROS     Musculoskeletal   Abdominal   Peds  Hematology   Anesthesia Other Findings   Reproductive/Obstetrics                             Anesthesia Physical  Anesthesia Plan  ASA: IV  Anesthesia Plan: General   Post-op Pain Management:    Induction: Intravenous  Airway Management Planned: Oral ETT  Additional Equipment:   Intra-op Plan:   Post-operative Plan: Post-operative intubation/ventilation  Informed Consent: I have reviewed the patients History and Physical, chart, labs and discussed the procedure including the risks, benefits and alternatives for the proposed anesthesia with the patient or authorized representative who has indicated his/her understanding and acceptance.   Dental advisory given  Plan Discussed with: CRNA, Anesthesiologist and Surgeon  Anesthesia Plan Comments: (Patient intubated, on fent gtt, prop infusion that needs wound vac change. He is intubated with post op respiratory failure and requires post op wound vac changes)        Anesthesia Quick Evaluation

## 2015-05-13 NOTE — Progress Notes (Signed)
2 Days Post-Op Procedure(s) (LRB): APPLICATION OF WOUND VAC (N/A) CHEST WALL RECONSTRUCTION (N/A) Subjective: Alert, anxious  Objective: Vital signs in last 24 hours: Temp:  [98.2 F (36.8 C)-101.2 F (38.4 C)] 98.3 F (36.8 C) (10/07 0403) Pulse Rate:  [56-89] 63 (10/07 0700) Cardiac Rhythm:  [-] Normal sinus rhythm (10/07 0400) Resp:  [13-29] 14 (10/07 0700) BP: (87-132)/(44-88) 118/50 mmHg (10/07 0700) SpO2:  [94 %-100 %] 100 % (10/07 0700) Arterial Line BP: (115-171)/(44-69) 132/51 mmHg (10/07 0700) FiO2 (%):  [50 %-60 %] 50 % (10/07 0400) Weight:  [291 lb 7.2 oz (132.2 kg)] 291 lb 7.2 oz (132.2 kg) (10/07 0600)  Hemodynamic parameters for last 24 hours:    Intake/Output from previous day: 10/06 0701 - 10/07 0700 In: 2684.4 [I.V.:1639.4; NG/GT:995; IV Piggyback:50] Out: 0071 [Urine:3050; Emesis/NG output:110; Drains:225; Chest Tube:10] Intake/Output this shift:    General appearance: alert Neurologic: intact Heart: regular rate and rhythm Lungs: rhonchi bilaterally Abdomen: normal findings: soft, non-tender Wound: VAC in place  Lab Results:  Recent Labs  05/11/15 0400 05/13/15 0435  WBC 8.4 10.2  HGB 9.8* 9.4*  HCT 29.6* 29.4*  PLT 164 215   BMET:  Recent Labs  05/11/15 0400 05/13/15 0435  NA 141 140  K 3.7 3.6  CL 105 102  CO2 30 29  GLUCOSE 111* 124*  BUN 8 16  CREATININE 0.86 0.82  CALCIUM 8.0* 8.0*    PT/INR: No results for input(s): LABPROT, INR in the last 72 hours. ABG    Component Value Date/Time   PHART 7.434 05/11/2015 1353   HCO3 31.2* 05/11/2015 1353   TCO2 33 05/11/2015 1353   O2SAT 92.0 05/11/2015 1353   CBG (last 3)   Recent Labs  05/12/15 1915 05/12/15 2358 05/13/15 0355  GLUCAP 117* 133* 108*    Assessment/Plan: S/P Procedure(s) (LRB): APPLICATION OF WOUND VAC (N/A) CHEST WALL RECONSTRUCTION (N/A) -  CV- stable  RESP- VDRF secondary to bleeding and now bronchitis  On ceftaz, was changed form rocephin, will  await culture results before adjusting   RENAL- creatinine OK, continue diuresis  ENDO- CBG well controlled  Nutrition- tolerating goal TF- will need to hold for VAC change under GA  Will take back to OR this afternoon for a VAC change   LOS: 8 days    [name] 05/13/2015

## 2015-05-14 ENCOUNTER — Inpatient Hospital Stay (HOSPITAL_COMMUNITY): Payer: Medicare HMO

## 2015-05-14 LAB — COMPREHENSIVE METABOLIC PANEL
ALT: 17 U/L (ref 17–63)
ANION GAP: 8 (ref 5–15)
AST: 24 U/L (ref 15–41)
Albumin: 1.8 g/dL — ABNORMAL LOW (ref 3.5–5.0)
Alkaline Phosphatase: 51 U/L (ref 38–126)
BUN: 20 mg/dL (ref 6–20)
CHLORIDE: 105 mmol/L (ref 101–111)
CO2: 32 mmol/L (ref 22–32)
Calcium: 8.1 mg/dL — ABNORMAL LOW (ref 8.9–10.3)
Creatinine, Ser: 0.88 mg/dL (ref 0.61–1.24)
GFR calc non Af Amer: >60 mL/min (ref >60)
Glucose, Bld: 112 mg/dL — ABNORMAL HIGH (ref 65–99)
POTASSIUM: 3.4 mmol/L — AB (ref 3.5–5.1)
SODIUM: 145 mmol/L (ref 135–145)
Total Bilirubin: 0.8 mg/dL (ref 0.3–1.2)
Total Protein: 5.4 g/dL — ABNORMAL LOW (ref 6.5–8.1)

## 2015-05-14 LAB — GLUCOSE, CAPILLARY
Glucose-Capillary: 106 mg/dL — ABNORMAL HIGH (ref 65–99)
Glucose-Capillary: 109 mg/dL — ABNORMAL HIGH (ref 65–99)
Glucose-Capillary: 114 mg/dL — ABNORMAL HIGH (ref 65–99)
Glucose-Capillary: 126 mg/dL — ABNORMAL HIGH (ref 65–99)
Glucose-Capillary: 95 mg/dL (ref 65–99)

## 2015-05-14 LAB — CBC
HCT: 27.7 % — ABNORMAL LOW (ref 39.0–52.0)
Hemoglobin: 8.5 g/dL — ABNORMAL LOW (ref 13.0–17.0)
MCH: 28.6 pg (ref 26.0–34.0)
MCHC: 30.7 g/dL (ref 30.0–36.0)
MCV: 93.3 fL (ref 78.0–100.0)
Platelets: 249 10*3/uL (ref 150–400)
RBC: 2.97 MIL/uL — AB (ref 4.22–5.81)
RDW: 14.7 % (ref 11.5–15.5)
WBC: 8.1 10*3/uL (ref 4.0–10.5)

## 2015-05-14 LAB — POCT I-STAT 3, ART BLOOD GAS (G3+)
Acid-Base Excess: 8 mmol/L — ABNORMAL HIGH (ref 0.0–2.0)
Bicarbonate: 32.6 meq/L — ABNORMAL HIGH (ref 20.0–24.0)
O2 Saturation: 97 %
Patient temperature: 98.9
TCO2: 34 mmol/L (ref 0–100)
pCO2 arterial: 46.8 mmHg — ABNORMAL HIGH (ref 35.0–45.0)
pH, Arterial: 7.452 — ABNORMAL HIGH (ref 7.350–7.450)
pO2, Arterial: 89 mmHg (ref 80.0–100.0)

## 2015-05-14 MED ORDER — POTASSIUM CHLORIDE 10 MEQ/50ML IV SOLN
10.0000 meq | INTRAVENOUS | Status: AC
  Administered 2015-05-14 (×3): 10 meq via INTRAVENOUS
  Filled 2015-05-14 (×3): qty 50

## 2015-05-14 NOTE — Op Note (Signed)
NAMEANGEL, WEEDON              ACCOUNT NO.:  [id number]  MEDICAL RECORD NO.:  [id number]  LOCATION:  2S04C                        FACILITY:  [facility]  PHYSICIAN:  Revonda Standard. Roxan Hockey, M.D.DATE OF BIRTH:  11-24-1950  DATE OF PROCEDURE:  05/13/2015 DATE OF DISCHARGE:                              OPERATIVE REPORT   PREOPERATIVE DIAGNOSIS:  Open chest wound.  POSTOPERATIVE DIAGNOSIS:  Open chest wound.  PROCEDURE:  Chest wall wound VAC change and removal of chest tube.  SURGEON:  Revonda Standard. Roxan Hockey, M.D.  ASSISTANT:  None.  ANESTHESIA:  General.  FINDINGS:  Wound granulating over 99% of area. Minimal debridement of subcutaneous fat required.  CLINICAL NOTE:  Jerry [name] is a 64 year old man who had undergone a chest wall resection.  He required return to the operating room for bleeding on postoperative day #4, and the chest wall prosthesis was removed.  A wound VAC was placed.  The VAC sponge needs to be changed due to difficulty controlling the patient's pain levels.  It was felt this would be best done in the operating room under general anesthesia.  The indications, risks, benefits, and alternatives were discussed in detail with the patient's daughter who gave consent.  OPERATIVE NOTE:  Mr. Leon was brought from the surgical intensive care unit to the operating room on May 13, 2015.  He had induction of general anesthesia.  The wound VAC and superficial sponge were removed. The chest was prepped and draped in usual sterile fashion.  The deeper sponge was removed.  The underlying tissue was clean and granulating over approximately 99% of the surface area.  There were some minimal areas of necrotic subcutaneous tissue which were debrided sharply.  The wound was copiously irrigated with saline.  The chest tubes had minimal drainage.  They were removed.  All four chest tube sites were packed with iodoform Nu Gauze.  The VAC sponge was replaced.  The dressing  was applied, and vacuum was initiated.  The patient tolerated the procedure well.  He was taken from the operating room back to the surgical intensive care unit intubated but in good condition.     Revonda Standard Roxan Hockey, M.D.    SCH/MEDQ  D:  05/13/2015  T:  05/14/2015  Job:  [id number]

## 2015-05-14 NOTE — Progress Notes (Signed)
Utilization Review Completed.  

## 2015-05-14 NOTE — Progress Notes (Signed)
1 Day Post-Op Procedure(s) (LRB): Chest wall WOUND VAC CHANGE and removal of chest tubes (N/A) Subjective: Intubated and sedated on fentanyl and propofol but responds  Copious, thick secretions. Culture growing Serratia sensitive to [employer].  Objective: Vital signs in last 24 hours: Temp:  [97.7 F (36.5 C)-99.7 F (37.6 C)] 99.7 F (37.6 C) (10/08 1219) Pulse Rate:  [51-75] 65 (10/08 1204) Cardiac Rhythm:  [-] Sinus bradycardia (10/08 0800) Resp:  [0-28] 28 (10/08 1204) BP: (83-112)/(37-53) 107/53 mmHg (10/08 1204) SpO2:  [98 %-100 %] 100 % (10/08 1204) Arterial Line BP: (92-129)/(42-58) 123/53 mmHg (10/08 1000) FiO2 (%):  [40 %-50 %] 40 % (10/08 1204) Weight:  [131.1 kg (289 lb 0.4 oz)] 131.1 kg (289 lb 0.4 oz) (10/08 0600)  Hemodynamic parameters for last 24 hours:    Intake/Output from previous day: 10/07 0701 - 10/08 0700 In: 2652 [I.V.:1603.5; FX/TK:240.9; IV Piggyback:400] Out: 3490 [Urine:3265; Emesis/NG output:100; Drains:125] Intake/Output this shift: Total I/O In: 349.1 [I.V.:209.1; NG/GT:90; IV Piggyback:50] Out: 800 [Urine:800]  General appearance: obese, chronically ill appearing Neurologic: intact Heart: regular rate and rhythm, S1, S2 normal, no murmur, click, rub or gallop Lungs: rhonchi bilaterally Abdomen: soft, non-tender; bowel sounds normal Extremities: edema moderate Wound: vac in place with low drainage  Lab Results:  Recent Labs  05/13/15 0435 05/14/15 0358  WBC 10.2 8.1  HGB 9.4* 8.5*  HCT 29.4* 27.7*  PLT 215 249   BMET:  Recent Labs  05/13/15 0435 05/14/15 0358  NA 140 145  K 3.6 3.4*  CL 102 105  CO2 29 32  GLUCOSE 124* 112*  BUN 16 20  CREATININE 0.82 0.88  CALCIUM 8.0* 8.1*    PT/INR: No results for input(s): LABPROT, INR in the last 72 hours. ABG    Component Value Date/Time   PHART 7.452* 05/14/2015 0350   HCO3 32.6* 05/14/2015 0350   TCO2 34 05/14/2015 0350   O2SAT 97.0 05/14/2015 0350   CBG (last 3)    Recent Labs  05/13/15 2008 05/13/15 2344 05/14/15 0747  GLUCAP 101* 109* 126*    CLINICAL DATA: Status post resection of right chest wall mass. Respiratory failure.  EXAM: PORTABLE CHEST 1 VIEW  COMPARISON: 05/13/2015  FINDINGS: Endotracheal tube remains with the tip approximately 5.5 cm above the carina. Right jugular central line tip shows stable positioning in the SVC. Midline surgical drain present. Lungs show some interval improved aeration in both lower lobes with residual atelectasis/ airspace disease remaining bilaterally, right greater than left. A relatively stable moderate right pleural effusion is present. The heart size is normal.  IMPRESSION: Improved aeration of both lower lung zones with residual atelectasis/airspace disease remaining. The right pleural effusion appears stable.   Electronically Signed  By: Aletta Edouard M.D.  On: 05/14/2015 08:10    Assessment/Plan: S/P Procedure(s) (LRB): Chest wall WOUND VAC CHANGE and removal of chest tubes (N/A)  He remains hemodynamically stable  Vent dependent postop after removal of chest wall reconstructive mesh, evacuation of hematoma and placement of vac. He still has copious, thick secretions growing Serratia felt to have bronchitis. CXR looks better but he sounds bad. Will hold off on weaning vent and continue Fortaz, pulmonary toilet.   Continue tube feeds at goal. Albumin is only 1.8.   Continue diuretic today. Still a few lbs over baseline and edematous.          LOS: 9 days    Gaye Pollack 05/14/2015

## 2015-05-14 NOTE — Progress Notes (Signed)
Patient ID: Jerry [name], male   DOB: 02/27/1951, 64 y.o.   MRN: [id number]  SICU Evening Rounds:  Hemodynamically stable  Remains on vent  Urine output ok

## 2015-05-15 ENCOUNTER — Inpatient Hospital Stay (HOSPITAL_COMMUNITY): Payer: Medicare HMO

## 2015-05-15 LAB — CBC
HCT: 29.5 % — ABNORMAL LOW (ref 39.0–52.0)
Hemoglobin: 8.9 g/dL — ABNORMAL LOW (ref 13.0–17.0)
MCH: 28.3 pg (ref 26.0–34.0)
MCHC: 30.2 g/dL (ref 30.0–36.0)
MCV: 93.7 fL (ref 78.0–100.0)
PLATELETS: 298 10*3/uL (ref 150–400)
RBC: 3.15 MIL/uL — AB (ref 4.22–5.81)
RDW: 14.6 % (ref 11.5–15.5)
WBC: 6.6 10*3/uL (ref 4.0–10.5)

## 2015-05-15 LAB — GLUCOSE, CAPILLARY
GLUCOSE-CAPILLARY: 138 mg/dL — AB (ref 65–99)
GLUCOSE-CAPILLARY: 89 mg/dL (ref 65–99)
GLUCOSE-CAPILLARY: 91 mg/dL (ref 65–99)
GLUCOSE-CAPILLARY: 99 mg/dL (ref 65–99)
Glucose-Capillary: 106 mg/dL — ABNORMAL HIGH (ref 65–99)
Glucose-Capillary: 103 mg/dL — ABNORMAL HIGH (ref 65–99)
Glucose-Capillary: 125 mg/dL — ABNORMAL HIGH (ref 65–99)

## 2015-05-15 LAB — BASIC METABOLIC PANEL
Anion gap: 9 (ref 5–15)
BUN: 18 mg/dL (ref 6–20)
CALCIUM: 8.3 mg/dL — AB (ref 8.9–10.3)
CO2: 34 mmol/L — ABNORMAL HIGH (ref 22–32)
CREATININE: 0.69 mg/dL (ref 0.61–1.24)
Chloride: 103 mmol/L (ref 101–111)
Glucose, Bld: 114 mg/dL — ABNORMAL HIGH (ref 65–99)
Potassium: 3.1 mmol/L — ABNORMAL LOW (ref 3.5–5.1)
SODIUM: 146 mmol/L — AB (ref 135–145)

## 2015-05-15 LAB — TRIGLYCERIDES: Triglycerides: 216 mg/dL — ABNORMAL HIGH (ref <150)

## 2015-05-15 MED ORDER — POTASSIUM CHLORIDE 10 MEQ/50ML IV SOLN
10.0000 meq | INTRAVENOUS | Status: AC
  Administered 2015-05-15 (×3): 10 meq via INTRAVENOUS
  Filled 2015-05-15 (×3): qty 50

## 2015-05-15 MED ORDER — POTASSIUM CHLORIDE 20 MEQ/15ML (10%) PO SOLN
40.0000 meq | Freq: Once | ORAL | Status: AC
  Administered 2015-05-15: 40 meq
  Filled 2015-05-15: qty 30

## 2015-05-15 NOTE — Progress Notes (Signed)
2 Days Post-Op Procedure(s) (LRB): Chest wall WOUND VAC CHANGE and removal of chest tubes (N/A) Subjective:  Intubated and sedated on Propofol and fentanyl but responds appropriately. Pain seems adequately controlled.  Objective: Vital signs in last 24 hours: Temp:  [97.6 F (36.4 C)-99.7 F (37.6 C)] 98.1 F (36.7 C) (10/09 0740) Pulse Rate:  [52-74] 69 (10/09 0900) Cardiac Rhythm:  [-] Normal sinus rhythm (10/09 0800) Resp:  [10-28] 22 (10/09 0900) BP: (89-123)/(40-62) 95/50 mmHg (10/09 0900) SpO2:  [100 %] 100 % (10/09 0900) Arterial Line BP: (90-145)/(37-63) 138/55 mmHg (10/09 0900) FiO2 (%):  [40 %] 40 % (10/09 0800) Weight:  [126.9 kg (279 lb 12.2 oz)] 126.9 kg (279 lb 12.2 oz) (10/09 0600)  Hemodynamic parameters for last 24 hours:    Intake/Output from previous day: 10/08 0701 - 10/09 0700 In: 2899.5 [I.V.:1659.5; NG/GT:1040; IV Piggyback:200] Out: 8144 [Urine:3455; Drains:250] Intake/Output this shift: Total I/O In: 256.4 [I.V.:136.4; NG/GT:120] Out: 1050 [Urine:1000; Drains:50]  Heart: regular rate and rhythm, S1, S2 normal, no murmur, click, rub or gallop Lungs: rhonchi bilaterally but less than yesterday Abdomen: soft, non-tender; bowel sounds normal; no masses,  no organomegaly Extremities: edema moderate Wound: vac in place and working well  Lab Results:  Recent Labs  05/13/15 0435 05/14/15 0358  WBC 10.2 8.1  HGB 9.4* 8.5*  HCT 29.4* 27.7*  PLT 215 249   BMET:  Recent Labs  05/13/15 0435 05/14/15 0358  NA 140 145  K 3.6 3.4*  CL 102 105  CO2 29 32  GLUCOSE 124* 112*  BUN 16 20  CREATININE 0.82 0.88  CALCIUM 8.0* 8.1*    PT/INR: No results for input(s): LABPROT, INR in the last 72 hours. ABG    Component Value Date/Time   PHART 7.452* 05/14/2015 0350   HCO3 32.6* 05/14/2015 0350   TCO2 34 05/14/2015 0350   O2SAT 97.0 05/14/2015 0350   CBG (last 3)   Recent Labs  05/14/15 2327 05/15/15 0351 05/15/15 0737  GLUCAP 106* 138* 89     Assessment/Plan: S/P Procedure(s) (LRB): Chest wall WOUND VAC CHANGE and removal of chest tubes (N/A)  He is hemodynamically stable  VDRF following exploration for bleeding and removal of chest wall prosthesis. Serratia tracheobronchitis on Fortaz. Secretions seem to be improving and sounds better. CXR pending today. Will keep intubated today since he may need vac change tomorrow. I suspect he may be marginal extubated due to disability from back, secretions and chest wall instability.  Continue diuresis for volume excess  Continue tube feeds at goal.   LOS: 10 days    Gaye Pollack 05/15/2015

## 2015-05-16 ENCOUNTER — Encounter (HOSPITAL_COMMUNITY): Payer: Self-pay | Admitting: Thoracic Surgery (Cardiothoracic Vascular Surgery)

## 2015-05-16 LAB — GLUCOSE, CAPILLARY
GLUCOSE-CAPILLARY: 114 mg/dL — AB (ref 65–99)
GLUCOSE-CAPILLARY: 92 mg/dL (ref 65–99)
GLUCOSE-CAPILLARY: 120 mg/dL — AB (ref 65–99)
Glucose-Capillary: 119 mg/dL — ABNORMAL HIGH (ref 65–99)
Glucose-Capillary: 102 mg/dL — ABNORMAL HIGH (ref 65–99)

## 2015-05-16 LAB — BASIC METABOLIC PANEL
Anion gap: 7 (ref 5–15)
BUN: 22 mg/dL — AB (ref 6–20)
CALCIUM: 8.4 mg/dL — AB (ref 8.9–10.3)
CO2: 35 mmol/L — AB (ref 22–32)
Chloride: 104 mmol/L (ref 101–111)
Creatinine, Ser: 0.69 mg/dL (ref 0.61–1.24)
GFR calc Af Amer: >60 mL/min (ref >60)
GLUCOSE: 123 mg/dL — AB (ref 65–99)
Potassium: 3.4 mmol/L — ABNORMAL LOW (ref 3.5–5.1)
Sodium: 146 mmol/L — ABNORMAL HIGH (ref 135–145)

## 2015-05-16 LAB — CBC
HCT: 27.2 % — ABNORMAL LOW (ref 39.0–52.0)
Hemoglobin: 8.4 g/dL — ABNORMAL LOW (ref 13.0–17.0)
MCH: 28.9 pg (ref 26.0–34.0)
MCHC: 30.9 g/dL (ref 30.0–36.0)
MCV: 93.5 fL (ref 78.0–100.0)
Platelets: 297 10*3/uL (ref 150–400)
RBC: 2.91 MIL/uL — ABNORMAL LOW (ref 4.22–5.81)
RDW: 14.5 % (ref 11.5–15.5)
WBC: 6.5 10*3/uL (ref 4.0–10.5)

## 2015-05-16 MED ORDER — POTASSIUM CHLORIDE 20 MEQ/15ML (10%) PO SOLN
40.0000 meq | Freq: Once | ORAL | Status: AC
  Administered 2015-05-16: 40 meq via ORAL
  Filled 2015-05-16: qty 30

## 2015-05-16 MED ORDER — FUROSEMIDE 10 MG/ML IJ SOLN
40.0000 mg | Freq: Every day | INTRAMUSCULAR | Status: DC
  Administered 2015-05-16 – 2015-05-17 (×2): 40 mg via INTRAVENOUS
  Filled 2015-05-16 (×2): qty 4

## 2015-05-16 MED ORDER — POTASSIUM CHLORIDE 10 MEQ/50ML IV SOLN
10.0000 meq | INTRAVENOUS | Status: AC
  Administered 2015-05-16 (×3): 10 meq via INTRAVENOUS
  Filled 2015-05-16 (×3): qty 50

## 2015-05-16 NOTE — Progress Notes (Signed)
3 Days Post-Op Procedure(s) (LRB): Chest wall WOUND VAC CHANGE and removal of chest tubes (N/A) Subjective: Intubated, alert and follows commands  Objective: Vital signs in last 24 hours: Temp:  [98.1 F (36.7 C)-98.9 F (37.2 C)] 98.9 F (37.2 C) (10/09 1900) Pulse Rate:  [51-73] 56 (10/10 0700) Cardiac Rhythm:  [-] Normal sinus rhythm;Sinus bradycardia (10/09 2017) Resp:  [9-25] 16 (10/10 0700) BP: (82-120)/(30-62) 96/49 mmHg (10/10 0700) SpO2:  [98 %-100 %] 100 % (10/10 0700) Arterial Line BP: (97-153)/(39-64) 115/48 mmHg (10/10 0700) FiO2 (%):  [40 %] 40 % (10/10 0421) Weight:  [278 lb (126.1 kg)] 278 lb (126.1 kg) (10/10 0500)  Hemodynamic parameters for last 24 hours:    Intake/Output from previous day: 10/09 0701 - 10/10 0700 In: 3537.8 [I.V.:1547.8; NG/GT:1590; IV Piggyback:400] Out: 3915 [Urine:3690; Drains:225] Intake/Output this shift:    General appearance: alert, cooperative and no distress Neurologic: intact Heart: regular rate and rhythm Lungs: rhonchi bilaterally Abdomen: normal findings: soft, non-tender WOUND VAC in place Lab Results:  Recent Labs  05/15/15 1100 05/16/15 0354  WBC 6.6 6.5  HGB 8.9* 8.4*  HCT 29.5* 27.2*  PLT 298 297   BMET:  Recent Labs  05/15/15 1100 05/16/15 0354  NA 146* 146*  K 3.1* 3.4*  CL 103 104  CO2 34* 35*  GLUCOSE 114* 123*  BUN 18 22*  CREATININE 0.69 0.69  CALCIUM 8.3* 8.4*    PT/INR: No results for input(s): LABPROT, INR in the last 72 hours. ABG    Component Value Date/Time   PHART 7.452* 05/14/2015 0350   HCO3 32.6* 05/14/2015 0350   TCO2 34 05/14/2015 0350   O2SAT 97.0 05/14/2015 0350   CBG (last 3)   Recent Labs  05/15/15 2018 05/15/15 2322 05/16/15 0339  GLUCAP 103* 125* 114*    Assessment/Plan: S/P Procedure(s) (LRB): Chest wall WOUND VAC CHANGE and removal of chest tubes (N/A) -  CV- stable RESP/ ID- day 4/7 ceftaz for bronchitis, probable pneumonia RENAL- hypernatremia-  decrease lasix  Hypokalemia- supplement K ENDO- CBG well controlled DVT prophylaxis- enoxaparin + SCD Nutrition- on goal TF  His wound VAC needs to be changed. Will ask Plastics to see re possible flap and try to coordinate change with them   LOS: 11 days    [name] 05/16/2015

## 2015-05-16 NOTE — Progress Notes (Signed)
K+= 3.4 and creat= 0.69 w/ urine o/p > 30cc/hr; TCTS KCL protocol initiated with 10 mEq KCL in 50cc IV x 3, each over one hour.

## 2015-05-16 NOTE — Progress Notes (Signed)
Nutrition Follow Up  DOCUMENTATION CODES:   Morbid obesity  INTERVENTION:   Recommend:   Vital High Protein at goal rate of 65 ml/hr   Prostat liquid protein 30 ml BID  Above TF regimen + current Propofol infusion to provide 1977 kcals, 166 gm protein, 602 ml of free water  NUTRITION DIAGNOSIS:   Inadequate oral intake related to inability to eat as evidenced by NPO status, ongoing  GOAL:   Provide needs based on ASPEN/SCCM guidelines, met  MONITOR:   TF tolerance, Vent status, Labs, Weight trends, Skin, I & O's  ASSESSMENT:   64 yo Male who has chronic pain due to multiple orthopedic injuries and surgeries in the past. His history is also significant for tobacco abuse, hypertension, arthritis, allergies and CAD. 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  Patient s/p procedures 10/3: THORACOTOMY MAJOR (RIGHT) EXPLORATION OF RIGHT CHEST REMOVAL CHEST WALL PLATE EVACUATION HEMATOMA (RIGHT)  Patient s/p procedures 10/8: CHEST WALL WOUND VAC CHANGE REMOVAL OF CHEST TUBE  Patient is currently intubated on ventilator support MV: 10.9 L/min Temp (24hrs), Avg:98.7 F (37.1 C), Min:98.5 F (36.9 C), Max:99 F (37.2 C)   Propofol: 8.2 ml/hr -----> 216 fat kcals   Vital High Protein formula initiated 10/5 and currently infusing at goal rate of 30 ml/hr via OGT.  Pt also receiving Prostat liquid protein 30 ml TID.    Diet Order:  Diet NPO time specified  Skin:   R chest wound VAC   Last BM:  N/A  Height:   Ht Readings from Last 1 Encounters:  05/07/15 6' (1.829 m)    Weight:   Wt Readings from Last 1 Encounters:  05/16/15 278 lb (126.1 kg)    Ideal Body Weight:  81 kg  BMI:  Body mass index is 37.7 kg/(m^2).  Estimated Nutritional Needs:   Kcal:  5643-3295  Protein:  160-170 gm  Fluid:  per MD  EDUCATION NEEDS:   No education needs identified at this time  Arthur Holms, RD, LDN Pager #:  [phone] After-Hours Pager #: [phone]

## 2015-05-16 NOTE — Progress Notes (Signed)
Patient ID: Jerry [name], male   DOB: 09-14-1950, 64 y.o.   MRN: [id number]  EVENING ROUNDS NOTE :     [address]       [place] [address]             [phone]                 3 Days Post-Op Procedure(s) (LRB): Chest wall WOUND VAC CHANGE and removal of chest tubes (N/A)  Total Length of Stay:  LOS: 11 days  BP 89/55 mmHg  Pulse 66  Temp(Src) 98.1 F (36.7 C) (Axillary)  Resp 20  Ht 6' (1.829 m)  Wt 278 lb (126.1 kg)  BMI 37.70 kg/m2  SpO2 98%  .Intake/Output      10/10 0701 - 10/11 0700   I.V. (mL/kg) 708.6 (5.6)   NG/GT 390   IV Piggyback 100   Total Intake(mL/kg) 1198.6 (9.5)   Urine (mL/kg/hr) 1480 (1)   Drains 60 (0)   Total Output 1540   Net -341.4         . 0.9 % NaCl with KCl 20 mEq / L 30 mL/hr at 05/16/15 1700  . feeding supplement (VITAL HIGH PROTEIN) 1,000 mL (05/16/15 1800)  . fentaNYL infusion INTRAVENOUS 250 mcg/hr (05/16/15 1800)  . propofol (DIPRIVAN) infusion 10.049 mcg/kg/min (05/16/15 1800)     Lab Results  Component Value Date   WBC 6.5 05/16/2015   HGB 8.4* 05/16/2015   HCT 27.2* 05/16/2015   PLT 297 05/16/2015   GLUCOSE 123* 05/16/2015   CHOL  09/01/2009    130        ATP III CLASSIFICATION:  <200     mg/dL   Desirable  200-239  mg/dL   Borderline High  >=240    mg/dL   High          TRIG 216* 05/15/2015   HDL 49 09/01/2009   LDLCALC  09/01/2009    63        Total Cholesterol/HDL:CHD Risk Coronary Heart Disease Risk Table                     Men   Women  1/2 Average Risk   3.4   3.3  Average Risk       5.0   4.4  2 X Average Risk   9.6   7.1  3 X Average Risk  23.4   11.0        Use the calculated Patient Ratio above and the CHD Risk Table to determine the patient's CHD Risk.        ATP III CLASSIFICATION (LDL):  <100     mg/dL   Optimal  100-129  mg/dL   Near or Above                    Optimal  130-159  mg/dL   Borderline  160-189  mg/dL   High  >190     mg/dL   Very High   ALT 17  05/14/2015   AST 24 05/14/2015   NA 146* 05/16/2015   K 3.4* 05/16/2015   CL 104 05/16/2015   CREATININE 0.69 05/16/2015   BUN 22* 05/16/2015   CO2 35* 05/16/2015   INR 1.54* 05/09/2015   Remains on vent , vs stable  [name] MD  Beeper [phone] Office [phone] 05/16/2015 7:15 PM

## 2015-05-17 LAB — GLUCOSE, CAPILLARY
GLUCOSE-CAPILLARY: 122 mg/dL — AB (ref 65–99)
GLUCOSE-CAPILLARY: 87 mg/dL (ref 65–99)
GLUCOSE-CAPILLARY: 92 mg/dL (ref 65–99)
GLUCOSE-CAPILLARY: 99 mg/dL (ref 65–99)
Glucose-Capillary: 105 mg/dL — ABNORMAL HIGH (ref 65–99)
Glucose-Capillary: 102 mg/dL — ABNORMAL HIGH (ref 65–99)

## 2015-05-17 LAB — CBC
HCT: 27.4 % — ABNORMAL LOW (ref 39.0–52.0)
Hemoglobin: 8.4 g/dL — ABNORMAL LOW (ref 13.0–17.0)
MCH: 29 pg (ref 26.0–34.0)
MCHC: 30.7 g/dL (ref 30.0–36.0)
MCV: 94.5 fL (ref 78.0–100.0)
Platelets: 334 10*3/uL (ref 150–400)
RBC: 2.9 MIL/uL — ABNORMAL LOW (ref 4.22–5.81)
RDW: 14.6 % (ref 11.5–15.5)
WBC: 6.4 10*3/uL (ref 4.0–10.5)

## 2015-05-17 LAB — BASIC METABOLIC PANEL
Anion gap: 6 (ref 5–15)
BUN: 25 mg/dL — AB (ref 6–20)
CALCIUM: 8.7 mg/dL — AB (ref 8.9–10.3)
CO2: 35 mmol/L — ABNORMAL HIGH (ref 22–32)
CREATININE: 0.69 mg/dL (ref 0.61–1.24)
Chloride: 107 mmol/L (ref 101–111)
GFR calc Af Amer: >60 mL/min (ref >60)
Glucose, Bld: 115 mg/dL — ABNORMAL HIGH (ref 65–99)
Potassium: 3.5 mmol/L (ref 3.5–5.1)
SODIUM: 148 mmol/L — AB (ref 135–145)

## 2015-05-17 MED ORDER — CHLORHEXIDINE GLUCONATE 0.12 % MT SOLN
OROMUCOSAL | Status: AC
  Filled 2015-05-17: qty 15

## 2015-05-17 MED ORDER — POTASSIUM CHLORIDE 10 MEQ/50ML IV SOLN
10.0000 meq | INTRAVENOUS | Status: AC
  Administered 2015-05-17 (×3): 10 meq via INTRAVENOUS
  Filled 2015-05-17 (×3): qty 50

## 2015-05-17 MED ORDER — FREE WATER
200.0000 mL | Freq: Three times a day (TID) | Status: DC
  Administered 2015-05-17 – 2015-05-19 (×9): 200 mL

## 2015-05-17 NOTE — Progress Notes (Signed)
4 Days Post-Op Procedure(s) (LRB): Chest wall WOUND VAC CHANGE and removal of chest tubes (N/A) Subjective: Intubated,  But alert. C/o back and incisional pain  Objective: Vital signs in last 24 hours: Temp:  [97.3 F (36.3 C)-99 F (37.2 C)] 97.6 F (36.4 C) (10/11 0730) Pulse Rate:  [48-80] 50 (10/11 0700) Cardiac Rhythm:  [-] Normal sinus rhythm;Sinus bradycardia (10/10 2000) Resp:  [14-28] 16 (10/11 0700) BP: (83-141)/(37-59) 83/37 mmHg (10/11 0700) SpO2:  [94 %-100 %] 99 % (10/11 0700) Arterial Line BP: (87-141)/(46-69) 109/51 mmHg (10/11 0700) FiO2 (%):  [40 %] 40 % (10/11 0309)  Hemodynamic parameters for last 24 hours:    Intake/Output from previous day: 10/10 0701 - 10/11 0700 In: 2818.7 [I.V.:1526.2; NG/GT:992.5; IV Piggyback:300] Out: 2500 [Urine:2340; Drains:160] Intake/Output this shift:    General appearance: cooperative and mild distress Neurologic: intact Heart: regular rate and rhythm Lungs: rhonchi bilaterally Abdomen: normal findings: soft, non-tender Wound: vac in place  Lab Results:  Recent Labs  05/16/15 0354 05/17/15 0401  WBC 6.5 6.4  HGB 8.4* 8.4*  HCT 27.2* 27.4*  PLT 297 334   BMET:  Recent Labs  05/16/15 0354 05/17/15 0401  NA 146* 148*  K 3.4* 3.5  CL 104 107  CO2 35* 35*  GLUCOSE 123* 115*  BUN 22* 25*  CREATININE 0.69 0.69  CALCIUM 8.4* 8.7*    PT/INR: No results for input(s): LABPROT, INR in the last 72 hours. ABG    Component Value Date/Time   PHART 7.452* 05/14/2015 0350   HCO3 32.6* 05/14/2015 0350   TCO2 34 05/14/2015 0350   O2SAT 97.0 05/14/2015 0350   CBG (last 3)   Recent Labs  05/16/15 1920 05/16/15 2350 05/17/15 0355  GLUCAP 120* 122* 105*    Assessment/Plan: S/P Procedure(s) (LRB): Chest wall WOUND VAC CHANGE and removal of chest tubes (N/A) -  CV- stable  RESP- VDRF, tolerated CPAP/PS for 6 hours yesterday- continue  RENAL- hypernatremia- add free water  Hypokalemia- supplement  K  ENDO- CBG well controlled  Nutrition- on goal TF  DVT prophylaxis- SCD + enoxaparin  WOUND- VAC in place, Plastics to see today, hopefully can close wound later this week   LOS: 12 days    [name] 05/17/2015

## 2015-05-17 NOTE — Progress Notes (Signed)
K+= 3.5 and creat= 0.69 w/ urine o/p > 30cc/hr; TCTS KCL protocol initiated with 10 mEq KCL in 50cc IV x 3, each over one hour.

## 2015-05-17 NOTE — Progress Notes (Signed)
PHARMACIST - PHYSICIAN COMMUNICATION  CONCERNING:  Ceftazidime   RECOMMENDATION: Add stop date to complete treatment   DESCRIPTION: On Day # 6 of Ceftazidime therapy for serratia in trach aspirate  Thank you Anette Guarneri, PharmD [phone]

## 2015-05-17 NOTE — Progress Notes (Signed)
Patient ID: Jerry [name], male   DOB: Dec 12, 1950, 64 y.o.   MRN: [id number]  SICU Evening Rounds  Hemodynamically stable Remains on vent Diuresing well No changes tonight

## 2015-05-18 ENCOUNTER — Inpatient Hospital Stay (HOSPITAL_COMMUNITY): Payer: Medicare HMO

## 2015-05-18 LAB — GLUCOSE, CAPILLARY
GLUCOSE-CAPILLARY: 104 mg/dL — AB (ref 65–99)
GLUCOSE-CAPILLARY: 109 mg/dL — AB (ref 65–99)
GLUCOSE-CAPILLARY: 87 mg/dL (ref 65–99)
GLUCOSE-CAPILLARY: 89 mg/dL (ref 65–99)
GLUCOSE-CAPILLARY: 97 mg/dL (ref 65–99)
Glucose-Capillary: 90 mg/dL (ref 65–99)
Glucose-Capillary: 98 mg/dL (ref 65–99)

## 2015-05-18 LAB — CBC
HEMATOCRIT: 27.5 % — AB (ref 39.0–52.0)
Hemoglobin: 8.4 g/dL — ABNORMAL LOW (ref 13.0–17.0)
MCH: 28.5 pg (ref 26.0–34.0)
MCHC: 30.5 g/dL (ref 30.0–36.0)
MCV: 93.2 fL (ref 78.0–100.0)
PLATELETS: 379 10*3/uL (ref 150–400)
RBC: 2.95 MIL/uL — ABNORMAL LOW (ref 4.22–5.81)
RDW: 14.8 % (ref 11.5–15.5)
WBC: 6.5 10*3/uL (ref 4.0–10.5)

## 2015-05-18 LAB — BASIC METABOLIC PANEL
Anion gap: 11 (ref 5–15)
BUN: 23 mg/dL — AB (ref 6–20)
CO2: 33 mmol/L — ABNORMAL HIGH (ref 22–32)
CREATININE: 0.73 mg/dL (ref 0.61–1.24)
Calcium: 8.7 mg/dL — ABNORMAL LOW (ref 8.9–10.3)
Chloride: 103 mmol/L (ref 101–111)
GFR calc Af Amer: >60 mL/min (ref >60)
Glucose, Bld: 118 mg/dL — ABNORMAL HIGH (ref 65–99)
Potassium: 3.4 mmol/L — ABNORMAL LOW (ref 3.5–5.1)
SODIUM: 147 mmol/L — AB (ref 135–145)

## 2015-05-18 LAB — TRIGLYCERIDES: TRIGLYCERIDES: 266 mg/dL — AB (ref <150)

## 2015-05-18 MED ORDER — POTASSIUM CHLORIDE 10 MEQ/50ML IV SOLN
10.0000 meq | INTRAVENOUS | Status: AC
  Administered 2015-05-18 (×3): 10 meq via INTRAVENOUS
  Filled 2015-05-18 (×3): qty 50

## 2015-05-18 MED ORDER — SODIUM CHLORIDE 0.45 % IV SOLN
INTRAVENOUS | Status: DC
  Administered 2015-05-18: 09:00:00 via INTRAVENOUS

## 2015-05-18 NOTE — Progress Notes (Addendum)
Updated pt's daughter Aldona Bar  on dressing change with Dr. Roxan Hockey and low heart rate. Samantha assisted this nurse with shaving the patient's rt neck to change the central line site. Pt tolerated well. New dressing applied per protocol. Taught Aldona Bar  how to perform mouth care. She demonstrated the skill without issues.

## 2015-05-18 NOTE — Care Management Important Message (Signed)
Important Message  Patient Details  Name: Jerry [name] MRN: [id number] Date of Birth: [birth year]   Medicare Important Message Given:  Yes-third notification given    Delorse Lek 05/18/2015, 1:40 PM

## 2015-05-18 NOTE — Progress Notes (Signed)
5 Days Post-Op Procedure(s) (LRB): Chest wall WOUND VAC CHANGE and removal of chest tubes (N/A) Subjective: Intubated Alert but less animated today  Objective: Vital signs in last 24 hours: Temp:  [97.1 F (36.2 C)-98.6 F (37 C)] 97.1 F (36.2 C) (10/12 0730) Pulse Rate:  [44-68] 54 (10/12 0700) Cardiac Rhythm:  [-] Normal sinus rhythm;Sinus bradycardia (10/12 0400) Resp:  [12-25] 12 (10/12 0700) BP: (72-142)/(28-68) 142/57 mmHg (10/12 0751) SpO2:  [96 %-100 %] 100 % (10/12 0700) Arterial Line BP: (100-140)/(38-62) 123/53 mmHg (10/12 0700) FiO2 (%):  [40 %] 40 % (10/12 0752)  Hemodynamic parameters for last 24 hours:    Intake/Output from previous day: 10/11 0701 - 10/12 0700 In: 2914.9 [I.V.:1024.9; NG/GT:1690; IV Piggyback:200] Out: 2785 [Urine:2535; Emesis/NG output:100; Drains:150] Intake/Output this shift: Total I/O In: 50 [IV Piggyback:50] Out: -   General appearance: alert, cooperative and no distress Neurologic: intact Heart: regular rate and rhythm Lungs: rhonchi faint bilaterally Abdomen: normal findings: soft, non-tender Wound: VAC in place. serosanguinous drainage from CT sites  Lab Results:  Recent Labs  05/17/15 0401 05/18/15 0456  WBC 6.4 6.5  HGB 8.4* 8.4*  HCT 27.4* 27.5*  PLT 334 379   BMET:  Recent Labs  05/17/15 0401 05/18/15 0456  NA 148* 147*  K 3.5 3.4*  CL 107 103  CO2 35* 33*  GLUCOSE 115* 118*  BUN 25* 23*  CREATININE 0.69 0.73  CALCIUM 8.7* 8.7*    PT/INR: No results for input(s): LABPROT, INR in the last 72 hours. ABG    Component Value Date/Time   PHART 7.452* 05/14/2015 0350   HCO3 32.6* 05/14/2015 0350   TCO2 34 05/14/2015 0350   O2SAT 97.0 05/14/2015 0350   CBG (last 3)   Recent Labs  05/17/15 2342 05/18/15 0357 05/18/15 0728  GLUCAP 97 90 98    Assessment/Plan: S/P Procedure(s) (LRB): Chest wall WOUND VAC CHANGE and removal of chest tubes (N/A) -  CV- stable  RESP- VDRF, continue PS wean, day 6  ceftaz for pneumonia  CXR stable  RENAL- hypokalemia and hypernatremia persist- holf lasix for now  ENDO- CBG well controlled  Nutrition- tolerating Goal TF  Wound- For reconstruction Friday   LOS: 13 days    [name] 05/18/2015

## 2015-05-18 NOTE — Progress Notes (Signed)
TCTS BRIEF SICU PROGRESS NOTE  5 Days Post-Op  S/P Procedure(s) (LRB): Chest wall WOUND VAC CHANGE and removal of chest tubes (N/A)   Stable day  Plan: Continue current plan  Rexene Alberts, MD 05/18/2015 9:50 PM

## 2015-05-19 ENCOUNTER — Other Ambulatory Visit: Payer: Self-pay | Admitting: Plastic Surgery

## 2015-05-19 DIAGNOSIS — L98499 Non-pressure chronic ulcer of skin of other sites with unspecified severity: Secondary | ICD-10-CM

## 2015-05-19 LAB — BASIC METABOLIC PANEL
ANION GAP: 6 (ref 5–15)
BUN: 23 mg/dL — ABNORMAL HIGH (ref 6–20)
CALCIUM: 8.6 mg/dL — AB (ref 8.9–10.3)
CO2: 32 mmol/L (ref 22–32)
Chloride: 104 mmol/L (ref 101–111)
Creatinine, Ser: 0.75 mg/dL (ref 0.61–1.24)
GLUCOSE: 118 mg/dL — AB (ref 65–99)
Potassium: 3.6 mmol/L (ref 3.5–5.1)
SODIUM: 142 mmol/L (ref 135–145)

## 2015-05-19 LAB — GLUCOSE, CAPILLARY
GLUCOSE-CAPILLARY: 93 mg/dL (ref 65–99)
Glucose-Capillary: 96 mg/dL (ref 65–99)
Glucose-Capillary: 85 mg/dL (ref 65–99)
Glucose-Capillary: 83 mg/dL (ref 65–99)
Glucose-Capillary: 78 mg/dL (ref 65–99)

## 2015-05-19 LAB — CBC
HCT: 27.6 % — ABNORMAL LOW (ref 39.0–52.0)
HEMOGLOBIN: 8.4 g/dL — AB (ref 13.0–17.0)
MCH: 28.3 pg (ref 26.0–34.0)
MCHC: 30.4 g/dL (ref 30.0–36.0)
MCV: 92.9 fL (ref 78.0–100.0)
Platelets: 385 10*3/uL (ref 150–400)
RBC: 2.97 MIL/uL — ABNORMAL LOW (ref 4.22–5.81)
RDW: 14.7 % (ref 11.5–15.5)
WBC: 5.8 10*3/uL (ref 4.0–10.5)

## 2015-05-19 LAB — APTT: APTT: 35 s (ref 24–37)

## 2015-05-19 LAB — PROTIME-INR
INR: 1.2 (ref 0.00–1.49)
PROTHROMBIN TIME: 15.4 s — AB (ref 11.6–15.2)

## 2015-05-19 MED ORDER — POTASSIUM CHLORIDE 10 MEQ/50ML IV SOLN
10.0000 meq | INTRAVENOUS | Status: AC
  Administered 2015-05-19 (×3): 10 meq via INTRAVENOUS

## 2015-05-19 NOTE — Progress Notes (Signed)
6 Days Post-Op Procedure(s) (LRB): Chest wall WOUND VAC CHANGE and removal of chest tubes (N/A) Subjective: Intubated, responds appropriately  Objective: Vital signs in last 24 hours: Temp:  [97.4 F (36.3 C)-98.3 F (36.8 C)] 97.4 F (36.3 C) (10/13 1300) Pulse Rate:  [43-66] 66 (10/13 1535) Cardiac Rhythm:  [-] Sinus bradycardia (10/13 1300) Resp:  [12-31] 23 (10/13 1535) BP: (85-139)/(39-63) 87/47 mmHg (10/13 0700) SpO2:  [98 %-100 %] 100 % (10/13 1535) Arterial Line BP: (96-138)/(37-59) 114/52 mmHg (10/13 1330) FiO2 (%):  [40 %] 40 % (10/13 1535) Weight:  [275 lb 5.7 oz (124.9 kg)] 275 lb 5.7 oz (124.9 kg) (10/13 0530)  Hemodynamic parameters for last 24 hours:    Intake/Output from previous day: 10/12 0701 - 10/13 0700 In: 3029 [I.V.:1029; NG/GT:1600; IV Piggyback:400] Out: 2385 [Urine:2235; Drains:150] Intake/Output this shift: Total I/O In: 940.7 [I.V.:260.7; NG/GT:630; IV Piggyback:50] Out: 160 [Urine:160]  General appearance: cooperative Neurologic: intact Heart: regular rate and rhythm Lungs: diminished breath sounds bibasilar Wound: VAC in place, CT sites clean  Lab Results:  Recent Labs  05/18/15 0456 05/19/15 0443  WBC 6.5 5.8  HGB 8.4* 8.4*  HCT 27.5* 27.6*  PLT 379 385   BMET:  Recent Labs  05/18/15 0456 05/19/15 0443  NA 147* 142  K 3.4* 3.6  CL 103 104  CO2 33* 32  GLUCOSE 118* 118*  BUN 23* 23*  CREATININE 0.73 0.75  CALCIUM 8.7* 8.6*    PT/INR:  Recent Labs  05/19/15 0443  LABPROT 15.4*  INR 1.20   ABG    Component Value Date/Time   PHART 7.452* 05/14/2015 0350   HCO3 32.6* 05/14/2015 0350   TCO2 34 05/14/2015 0350   O2SAT 97.0 05/14/2015 0350   CBG (last 3)   Recent Labs  05/19/15 0343 05/19/15 0835 05/19/15 1235  GLUCAP 96 85 93    Assessment/Plan: S/P Procedure(s) (LRB): Chest wall WOUND VAC CHANGE and removal of chest tubes (N/A) -  Serratia pneumonia- has completed course of IV antibiotics, dc ceftaz  after today's doses Open chest wound- VAC in place, saline wet to dry dressing changes to CT sites For reconstruction tomorrow Hold tube feeds at midnight   LOS: 14 days    Jerry [name] 05/19/2015
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
## 2015-05-19 NOTE — Progress Notes (Signed)
CT surgery p.m. Rounds  Patient resting comfortably on ventilator on IV  sedation. He remains in sinus bradycardia heart rate 55-60. No fever. Minimal drainage from wound VAC. Plan for reconstructive chest wall surgery tomorrow midmorning. Tube feeds will stop early a.m.

## 2015-05-19 NOTE — Progress Notes (Signed)
Chaplain responded the request of the Nursing Unit to provide spiritual support to the patient who had recent surgery and was experiencing some emotional needs with additional surgery coming  in the next few days.  Chaplain presented to the patient and his son who was present at the time of this visit.  Chaplain dialogued with the son concerning his father's health status and expressed his desire for his father's condition to improve. The patient is on the ventilator and unable to speak at this time, but was alert enough to understand what was going on in his surroundings. Chaplain offered a prayer of healing for his physical body, and offered works of encouragement for his emotional needs.  They both were appreciation of the visit and welcomed other visits from the [place]. Chaplain Yaakov Guthrie [phone]

## 2015-05-20 ENCOUNTER — Inpatient Hospital Stay (HOSPITAL_COMMUNITY): Payer: Medicare HMO | Admitting: Anesthesiology

## 2015-05-20 ENCOUNTER — Encounter (HOSPITAL_COMMUNITY)
Admission: RE | Disposition: A | Discharge status: Rehabilitation Facility | Payer: Self-pay | Source: Ambulatory Visit | Attending: Thoracic Surgery (Cardiothoracic Vascular Surgery)

## 2015-05-20 ENCOUNTER — Inpatient Hospital Stay (HOSPITAL_COMMUNITY): Payer: Medicare HMO

## 2015-05-20 DIAGNOSIS — Z8589 Personal history of malignant neoplasm of other organs and systems: Secondary | ICD-10-CM

## 2015-05-20 DIAGNOSIS — M954 Acquired deformity of chest and rib: Secondary | ICD-10-CM

## 2015-05-20 HISTORY — PX: CHEST WALL RECONSTRUCTION: SHX5103

## 2015-05-20 HISTORY — PX: TRAM: SHX5363

## 2015-05-20 LAB — POCT I-STAT 7, (LYTES, BLD GAS, ICA,H+H)
ACID-BASE EXCESS: 5 mmol/L — AB (ref 0.0–2.0)
Acid-Base Excess: 5 mmol/L — ABNORMAL HIGH (ref 0.0–2.0)
BICARBONATE: 32 meq/L — AB (ref 20.0–24.0)
BICARBONATE: 30.6 meq/L — AB (ref 20.0–24.0)
CALCIUM ION: 1.17 mmol/L (ref 1.13–1.30)
Calcium, Ion: 1.16 mmol/L (ref 1.13–1.30)
HCT: 30 % — ABNORMAL LOW (ref 39.0–52.0)
HEMATOCRIT: 29 % — AB (ref 39.0–52.0)
Hemoglobin: 10.2 g/dL — ABNORMAL LOW (ref 13.0–17.0)
Hemoglobin: 9.9 g/dL — ABNORMAL LOW (ref 13.0–17.0)
O2 Saturation: 98 %
O2 Saturation: 98 %
PCO2 ART: 55 mmHg — AB (ref 35.0–45.0)
PH ART: 7.415 (ref 7.350–7.450)
PO2 ART: 108 mmHg — AB (ref 80.0–100.0)
POTASSIUM: 4.1 mmol/L (ref 3.5–5.1)
Patient temperature: 35.4
Potassium: 4 mmol/L (ref 3.5–5.1)
SODIUM: 144 mmol/L (ref 135–145)
Sodium: 143 mmol/L (ref 135–145)
TCO2: 34 mmol/L (ref 0–100)
TCO2: 32 mmol/L (ref 0–100)
pCO2 arterial: 47.1 mmHg — ABNORMAL HIGH (ref 35.0–45.0)
pH, Arterial: 7.366 (ref 7.350–7.450)
pO2, Arterial: 101 mmHg — ABNORMAL HIGH (ref 80.0–100.0)

## 2015-05-20 LAB — GLUCOSE, CAPILLARY
GLUCOSE-CAPILLARY: 111 mg/dL — AB (ref 65–99)
Glucose-Capillary: 86 mg/dL (ref 65–99)
Glucose-Capillary: 92 mg/dL (ref 65–99)
Glucose-Capillary: 93 mg/dL (ref 65–99)

## 2015-05-20 LAB — POCT I-STAT 4, (NA,K, GLUC, HGB,HCT)
GLUCOSE: 124 mg/dL — AB (ref 65–99)
HEMATOCRIT: 27 % — AB (ref 39.0–52.0)
HEMOGLOBIN: 9.2 g/dL — AB (ref 13.0–17.0)
Potassium: 3.9 mmol/L (ref 3.5–5.1)
Sodium: 144 mmol/L (ref 135–145)

## 2015-05-20 SURGERY — RECONSTRUCTION, MAJOR, CHEST WALL
Anesthesia: General | Site: Chest

## 2015-05-20 MED ORDER — MIDAZOLAM HCL 5 MG/5ML IJ SOLN
INTRAMUSCULAR | Status: DC | PRN
  Administered 2015-05-20 (×2): 2 mg via INTRAVENOUS

## 2015-05-20 MED ORDER — FENTANYL CITRATE (PF) 250 MCG/5ML IJ SOLN
INTRAMUSCULAR | Status: AC
  Filled 2015-05-20: qty 5

## 2015-05-20 MED ORDER — EVICEL 5 ML EX KIT
PACK | CUTANEOUS | Status: DC | PRN
  Administered 2015-05-20: 5 mL

## 2015-05-20 MED ORDER — MIDAZOLAM HCL 2 MG/2ML IJ SOLN
INTRAMUSCULAR | Status: AC
  Filled 2015-05-20: qty 4

## 2015-05-20 MED ORDER — PHENYLEPHRINE HCL 10 MG/ML IJ SOLN
INTRAMUSCULAR | Status: DC | PRN
  Administered 2015-05-20: 80 ug via INTRAVENOUS
  Administered 2015-05-20: 120 ug via INTRAVENOUS

## 2015-05-20 MED ORDER — POTASSIUM CHLORIDE IN NACL 20-0.45 MEQ/L-% IV SOLN
INTRAVENOUS | Status: DC
  Administered 2015-05-20 – 2015-05-21 (×2): via INTRAVENOUS
  Administered 2015-05-22: 50 mL/h via INTRAVENOUS
  Administered 2015-05-23 – 2015-05-24 (×2): via INTRAVENOUS
  Filled 2015-05-20 (×13): qty 1000

## 2015-05-20 MED ORDER — EVICEL 5 ML EX KIT
PACK | CUTANEOUS | Status: AC
  Filled 2015-05-20: qty 1

## 2015-05-20 MED ORDER — SODIUM CHLORIDE 0.9 % IR SOLN
Status: DC | PRN
  Administered 2015-05-20: 500 mL

## 2015-05-20 MED ORDER — ALBUMIN HUMAN 5 % IV SOLN
INTRAVENOUS | Status: DC | PRN
  Administered 2015-05-20: 12:00:00 via INTRAVENOUS

## 2015-05-20 MED ORDER — FENTANYL CITRATE (PF) 100 MCG/2ML IJ SOLN
INTRAMUSCULAR | Status: DC | PRN
  Administered 2015-05-20 (×2): 50 ug via INTRAVENOUS
  Administered 2015-05-20 (×2): 100 ug via INTRAVENOUS
  Administered 2015-05-20: 50 ug via INTRAVENOUS
  Administered 2015-05-20: 150 ug via INTRAVENOUS
  Administered 2015-05-20: 100 ug via INTRAVENOUS
  Administered 2015-05-20: 150 ug via INTRAVENOUS

## 2015-05-20 MED ORDER — SODIUM CHLORIDE 0.9 % IV SOLN
INTRAVENOUS | Status: DC | PRN
  Administered 2015-05-20: 12:00:00 via INTRAVENOUS

## 2015-05-20 MED ORDER — LACTATED RINGERS IV SOLN
INTRAVENOUS | Status: DC | PRN
  Administered 2015-05-20: 10:00:00 via INTRAVENOUS

## 2015-05-20 MED ORDER — PROPOFOL 10 MG/ML IV BOLUS
INTRAVENOUS | Status: AC
  Filled 2015-05-20: qty 20

## 2015-05-20 MED ORDER — SODIUM CHLORIDE 0.9 % IV SOLN
10.0000 mg | INTRAVENOUS | Status: DC | PRN
  Administered 2015-05-20: 50 ug/min via INTRAVENOUS

## 2015-05-20 MED ORDER — NICOTINE 14 MG/24HR TD PT24
14.0000 mg | MEDICATED_PATCH | Freq: Every day | TRANSDERMAL | Status: DC
  Filled 2015-05-20 (×3): qty 1

## 2015-05-20 MED ORDER — EVICEL 5 ML EX KIT
PACK | CUTANEOUS | Status: AC
  Filled 2015-05-20: qty 2

## 2015-05-20 MED ORDER — VANCOMYCIN HCL IN DEXTROSE 1-5 GM/200ML-% IV SOLN
1000.0000 mg | Freq: Two times a day (BID) | INTRAVENOUS | Status: DC
  Administered 2015-05-20: 1000 mg via INTRAVENOUS
  Filled 2015-05-20: qty 200

## 2015-05-20 MED ORDER — ENOXAPARIN SODIUM 40 MG/0.4ML ~~LOC~~ SOLN
40.0000 mg | SUBCUTANEOUS | Status: DC
Start: 2015-05-21 — End: 2015-05-31
  Administered 2015-05-21 – 2015-05-31 (×11): 40 mg via SUBCUTANEOUS
  Filled 2015-05-20 (×13): qty 0.4

## 2015-05-20 MED ORDER — 0.9 % SODIUM CHLORIDE (POUR BTL) OPTIME
TOPICAL | Status: DC | PRN
  Administered 2015-05-20 (×3): 1000 mL

## 2015-05-20 MED ORDER — LACTATED RINGERS IV SOLN
INTRAVENOUS | Status: DC | PRN
  Administered 2015-05-20 (×2): via INTRAVENOUS

## 2015-05-20 MED ORDER — ROCURONIUM BROMIDE 100 MG/10ML IV SOLN
INTRAVENOUS | Status: DC | PRN
Start: 2015-05-20 — End: 2015-05-20
  Administered 2015-05-20 (×2): 50 mg via INTRAVENOUS

## 2015-05-20 MED ORDER — ALBUMIN HUMAN 5 % IV SOLN: INTRAVENOUS | Status: DC | PRN

## 2015-05-20 MED ORDER — VANCOMYCIN HCL 10 G IV SOLR
1250.0000 mg | Freq: Two times a day (BID) | INTRAVENOUS | Status: DC
  Administered 2015-05-20 – 2015-05-25 (×10): 1250 mg via INTRAVENOUS
  Filled 2015-05-20 (×11): qty 1250

## 2015-05-20 MED ORDER — HEMOSTATIC AGENTS (NO CHARGE) OPTIME
TOPICAL | Status: DC | PRN
  Administered 2015-05-20 (×2): 1 via TOPICAL

## 2015-05-20 MED ORDER — VECURONIUM BROMIDE 10 MG IV SOLR
INTRAVENOUS | Status: DC | PRN
  Administered 2015-05-20 (×4): 2 mg via INTRAVENOUS

## 2015-05-20 MED ORDER — ARTIFICIAL TEARS OP OINT
TOPICAL_OINTMENT | OPHTHALMIC | Status: DC | PRN
  Administered 2015-05-20: 1 via OPHTHALMIC

## 2015-05-20 SURGICAL SUPPLY — 155 items
ADH SKN CLS APL DERMABOND .7 (GAUZE/BANDAGES/DRESSINGS) ×2
APL SRG 22X2 LUM MLBL SLNT (VASCULAR PRODUCTS)
APPLICATOR TIP EXT COSEAL (VASCULAR PRODUCTS) IMPLANT
APPLIER CLIP 9.375 MED OPEN (MISCELLANEOUS) ×4
APPLIER CLIP LOGIC TI 5 (MISCELLANEOUS) IMPLANT
APPLIER CLIP ROT 10 11.4 M/L (STAPLE)
APR CLP MED 9.3 20 MLT OPN (MISCELLANEOUS) ×2
APR CLP MED LRG 11.4X10 (STAPLE)
APR CLP MED LRG 33X5 (MISCELLANEOUS)
BAG DECANTER FOR FLEXI CONT (MISCELLANEOUS) ×4 IMPLANT
BINDER ABDOMINAL 12 ML 46-62 (SOFTGOODS) ×2 IMPLANT
BINDER BREAST XLRG (GAUZE/BANDAGES/DRESSINGS) ×4 IMPLANT
BINDER BREAST XXLRG (GAUZE/BANDAGES/DRESSINGS) ×2 IMPLANT
BIT DRILL 7/64X5 DISP (BIT) ×4 IMPLANT
BLADE 10 SAFETY STRL DISP (BLADE) ×2 IMPLANT
BLADE OSCILLATING /SAGITTAL (BLADE) ×2 IMPLANT
BLADE SURG 10 STRL SS (BLADE) ×4 IMPLANT
BLADE SURG 15 STRL LF DISP TIS (BLADE) ×2 IMPLANT
BLADE SURG 15 STRL SS (BLADE)
BLADE SURG ROTATE 9660 (MISCELLANEOUS) IMPLANT
BOWL SMART MIX CTS (DISPOSABLE) ×2 IMPLANT
CANISTER SUCTION 2500CC (MISCELLANEOUS) ×6 IMPLANT
CANISTER WOUND CARE 500ML ATS (WOUND CARE) ×2 IMPLANT
CATH HYDRAGLIDE XL THORACIC (CATHETERS) IMPLANT
CATH THORACIC 28FR (CATHETERS) IMPLANT
CATH THORACIC 36FR (CATHETERS) IMPLANT
CATH THORACIC 36FR RT ANG (CATHETERS) IMPLANT
CEMENT BONE SIMPLEX SPEEDSET (Cement) ×4 IMPLANT
CEMENT KYPHON C01A KIT/MIXER (Cement) ×2 IMPLANT
CHLORAPREP W/TINT 26ML (MISCELLANEOUS) ×2 IMPLANT
CLIP APPLIE 9.375 MED OPEN (MISCELLANEOUS) ×2 IMPLANT
CLIP APPLIE ROT 10 11.4 M/L (STAPLE) IMPLANT
CLOSURE WOUND 1/2 X4 (GAUZE/BANDAGES/DRESSINGS)
CONN Y 3/8X3/8X3/8  BEN (MISCELLANEOUS)
CONN Y 3/8X3/8X3/8 BEN (MISCELLANEOUS) IMPLANT
CONT SPEC 4OZ CLIKSEAL STRL BL (MISCELLANEOUS) ×4 IMPLANT
CORDS BIPOLAR (ELECTRODE) ×4 IMPLANT
COUNTER NEEDLE 20 DBL MAG RED (NEEDLE) ×2 IMPLANT
COVER MAYO STAND STRL (DRAPES) IMPLANT
COVER SURGICAL LIGHT HANDLE (MISCELLANEOUS) ×6 IMPLANT
DECANTER SPIKE VIAL GLASS SM (MISCELLANEOUS) ×2 IMPLANT
DERMABOND ADVANCED (GAUZE/BANDAGES/DRESSINGS) ×2
DERMABOND ADVANCED .7 DNX12 (GAUZE/BANDAGES/DRESSINGS) ×2 IMPLANT
DRAIN CHANNEL 19F RND (DRAIN) ×4 IMPLANT
DRAIN CHANNEL 28F RND 3/8 FF (WOUND CARE) IMPLANT
DRAIN CHANNEL 32F RND 10.7 FF (WOUND CARE) ×2 IMPLANT
DRAPE CHEST BREAST 15X10 FENES (DRAPES) ×2 IMPLANT
DRAPE INCISE IOBAN 66X45 STRL (DRAPES) ×2 IMPLANT
DRAPE ORTHO SPLIT 77X108 STRL (DRAPES) ×8
DRAPE PROXIMA HALF (DRAPES) ×4 IMPLANT
DRAPE SURG 17X11 SM STRL (DRAPES) ×4 IMPLANT
DRAPE SURG ORHT 6 SPLT 77X108 (DRAPES) ×4 IMPLANT
DRAPE WARM FLUID 44X44 (DRAPE) ×4 IMPLANT
DRSG MEPILEX BORDER 4X12 (GAUZE/BANDAGES/DRESSINGS) ×2 IMPLANT
DRSG PAD ABDOMINAL 8X10 ST (GAUZE/BANDAGES/DRESSINGS) ×10 IMPLANT
DRSG VAC ATS MED SENSATRAC (GAUZE/BANDAGES/DRESSINGS) ×2 IMPLANT
ELECT BLADE 4.0 EZ CLEAN MEGAD (MISCELLANEOUS) ×4
ELECT BLADE 6.5 EXT (BLADE) ×2 IMPLANT
ELECT CAUTERY BLADE 6.4 (BLADE) ×2 IMPLANT
ELECT REM PT RETURN 9FT ADLT (ELECTROSURGICAL) ×4
ELECTRODE BLDE 4.0 EZ CLN MEGD (MISCELLANEOUS) IMPLANT
ELECTRODE REM PT RTRN 9FT ADLT (ELECTROSURGICAL) ×4 IMPLANT
EVACUATOR SILICONE 100CC (DRAIN) ×4 IMPLANT
EVICEL AIRLESS SPRAY ACCES (MISCELLANEOUS) ×2 IMPLANT
GAUZE SPONGE 4X4 12PLY STRL (GAUZE/BANDAGES/DRESSINGS) ×6 IMPLANT
GAUZE XEROFORM 5X9 LF (GAUZE/BANDAGES/DRESSINGS) ×2 IMPLANT
GLOVE BIO SURGEON STRL SZ 6.5 (GLOVE) ×7 IMPLANT
GLOVE BIO SURGEON STRL SZ7.5 (GLOVE) ×8 IMPLANT
GLOVE BIO SURGEONS STRL SZ 6.5 (GLOVE) ×5
GLOVE BIOGEL PI IND STRL 7.0 (GLOVE) IMPLANT
GLOVE BIOGEL PI IND STRL 7.5 (GLOVE) IMPLANT
GLOVE BIOGEL PI INDICATOR 7.0 (GLOVE) ×2
GLOVE BIOGEL PI INDICATOR 7.5 (GLOVE) ×6
GLOVE SS BIOGEL STRL SZ 6.5 (GLOVE) IMPLANT
GLOVE SUPERSENSE BIOGEL SZ 6.5 (GLOVE) ×2
GLOVE SURG SIGNA 7.5 PF LTX (GLOVE) ×10 IMPLANT
GOWN STRL REUS W/ TWL LRG LVL3 (GOWN DISPOSABLE) ×8 IMPLANT
GOWN STRL REUS W/ TWL XL LVL3 (GOWN DISPOSABLE) ×2 IMPLANT
GOWN STRL REUS W/TWL LRG LVL3 (GOWN DISPOSABLE) ×8
GOWN STRL REUS W/TWL XL LVL3 (GOWN DISPOSABLE) ×12
GRAFT FLEX HD 4X16 THICK (Tissue Mesh) ×2 IMPLANT
HEMOSTAT SURGICEL 2X14 (HEMOSTASIS) IMPLANT
KIT BASIN OR (CUSTOM PROCEDURE TRAY) ×8 IMPLANT
KIT ROOM TURNOVER OR (KITS) ×4 IMPLANT
LIQUID BAND (GAUZE/BANDAGES/DRESSINGS) ×2 IMPLANT
MARKER SKIN DUAL TIP RULER LAB (MISCELLANEOUS) ×4 IMPLANT
MESH HERNIA 6X6 BARD (Mesh General) IMPLANT
MESH HERNIA BARD 6X6 (Mesh General) ×4 IMPLANT
MICROMATRIX 500MG (Tissue) ×4 IMPLANT
NS IRRIG 1000ML POUR BTL (IV SOLUTION) ×12 IMPLANT
PACK CHEST (CUSTOM PROCEDURE TRAY) ×4 IMPLANT
PACK GENERAL/GYN (CUSTOM PROCEDURE TRAY) ×2 IMPLANT
PAD ARMBOARD 7.5X6 YLW CONV (MISCELLANEOUS) ×12 IMPLANT
PAD SHARPS MAGNETIC DISPOSAL (MISCELLANEOUS) IMPLANT
SAW GIGLI STERILE 20 (MISCELLANEOUS) ×2 IMPLANT
SEALANT PROGEL (MISCELLANEOUS) ×4 IMPLANT
SEALANT SURG COSEAL 4ML (VASCULAR PRODUCTS) IMPLANT
SEALANT SURG COSEAL 8ML (VASCULAR PRODUCTS) IMPLANT
SOLUTION PARTIC MCRMTRX 500MG (Tissue) IMPLANT
SPECIMEN JAR MEDIUM (MISCELLANEOUS) ×4 IMPLANT
SPONGE GAUZE 4X4 12PLY STER LF (GAUZE/BANDAGES/DRESSINGS) ×6 IMPLANT
SPONGE LAP 18X18 X RAY DECT (DISPOSABLE) ×2 IMPLANT
SPONGE LAP 4X18 X RAY DECT (DISPOSABLE) ×4 IMPLANT
SPONGE TONSIL 1 RF SGL (DISPOSABLE) ×2 IMPLANT
STAPLER VISISTAT 35W (STAPLE) ×2 IMPLANT
STRIP CLOSURE SKIN 1/2X4 (GAUZE/BANDAGES/DRESSINGS) ×8 IMPLANT
SUT ETHIBOND NAB CTX #1 30IN (SUTURE) ×8 IMPLANT
SUT ETHILON 2 0 FS 18 (SUTURE) ×4 IMPLANT
SUT MNCRL AB 3-0 PS2 18 (SUTURE) ×20 IMPLANT
SUT MNCRL AB 4-0 PS2 18 (SUTURE) ×18 IMPLANT
SUT MON AB 2-0 CT1 36 (SUTURE) ×10 IMPLANT
SUT MON AB 3-0 SH 27 (SUTURE) ×4
SUT MON AB 3-0 SH27 (SUTURE) IMPLANT
SUT MON AB 5-0 PS2 18 (SUTURE) ×8 IMPLANT
SUT PDS AB 2-0 CT1 27 (SUTURE) ×22 IMPLANT
SUT PDS AB 3-0 SH 27 (SUTURE) ×4 IMPLANT
SUT PROLENE 0 CT 1 CR/8 (SUTURE) IMPLANT
SUT PROLENE 1 CT (SUTURE) ×40 IMPLANT
SUT PROLENE 1 MO 6 (SUTURE) IMPLANT
SUT PROLENE 1 XLH 60 (SUTURE) IMPLANT
SUT PROLENE 2 0 CR (SUTURE) IMPLANT
SUT PROLENE 2 TP 1 (SUTURE) IMPLANT
SUT PROLENE 3 0 SH DA (SUTURE) IMPLANT
SUT PROLENE 4 0 RB 1 (SUTURE)
SUT PROLENE 4-0 RB1 .5 CRCL 36 (SUTURE) IMPLANT
SUT SILK  1 MH (SUTURE) ×4
SUT SILK 0 FSL (SUTURE) ×4 IMPLANT
SUT SILK 1 MH (SUTURE) IMPLANT
SUT SILK 2 0SH CR/8 30 (SUTURE) ×4 IMPLANT
SUT SILK 4 0 CR 8 RB 1 (SUTURE) ×2 IMPLANT
SUT VIC AB 1 CTX 18 (SUTURE) ×2 IMPLANT
SUT VIC AB 1 CTX 36 (SUTURE)
SUT VIC AB 1 CTX36XBRD ANBCTR (SUTURE) ×2 IMPLANT
SUT VIC AB 2-0 CTX 36 (SUTURE) ×2 IMPLANT
SUT VIC AB 3-0 MH 27 (SUTURE) IMPLANT
SUT VIC AB 3-0 PS2 18 (SUTURE)
SUT VIC AB 3-0 PS2 18XBRD (SUTURE) ×4 IMPLANT
SUT VIC AB 3-0 SH 18 (SUTURE) ×2 IMPLANT
SUT VIC AB 3-0 X1 27 (SUTURE) ×2 IMPLANT
SUT VICRYL 2 TP 1 (SUTURE) ×2 IMPLANT
SUT VICRYL 4-0 PS2 18IN ABS (SUTURE) ×16 IMPLANT
SYR BULB IRRIGATION 50ML (SYRINGE) ×2 IMPLANT
SYSTEM SAHARA CHEST DRAIN RE-I (WOUND CARE) ×4 IMPLANT
TAPE CLOTH 4X10 WHT NS (GAUZE/BANDAGES/DRESSINGS) ×2 IMPLANT
TAPE CLOTH SURG 4X10 WHT LF (GAUZE/BANDAGES/DRESSINGS) ×6 IMPLANT
TAPE UMBILICAL COTTON 1/8X30 (MISCELLANEOUS) IMPLANT
TOWEL OR 17X24 6PK STRL BLUE (TOWEL DISPOSABLE) ×4 IMPLANT
TOWEL OR 17X26 10 PK STRL BLUE (TOWEL DISPOSABLE) ×4 IMPLANT
TRAY FOLEY CATH 14FRSI W/METER (CATHETERS) ×2 IMPLANT
TRAY FOLEY W/METER SILVER 16FR (SET/KITS/TRAYS/PACK) ×2 IMPLANT
TUBE CONNECTING 12'X1/4 (SUCTIONS) ×2
TUBE CONNECTING 12X1/4 (SUCTIONS) ×2 IMPLANT
TUNNELER SHEATH ON-Q 11GX8 DSP (PAIN MANAGEMENT) IMPLANT
WATER STERILE IRR 1000ML POUR (IV SOLUTION) ×8 IMPLANT
YANKAUER SUCT BULB TIP NO VENT (SUCTIONS) ×4 IMPLANT

## 2015-05-20 NOTE — Care Management Note (Signed)
Case Management Note  Patient Details  Name: Jerry [name] MRN: [id number] Date of Birth: 11-Sep-1950  Subjective/Objective:        Admitted for Chest Wall resection on 9-29 - left open and returned for closure, now planned for plastic surgery today 12-14.  Remains on vent.             Action/Plan:   Expected Discharge Date:                  Expected Discharge Plan:  Skilled Nursing Facility  In-House Referral:  Clinical Social Work  Discharge planning Services  CM Consult  Post Acute Care Choice:    Choice offered to:     DME Arranged:    DME Agency:     HH Arranged:    [place] Agency:     Status of Service:  In process, will continue to follow  Medicare Important Message Given:  Yes-third notification given Date Medicare IM Given:    Medicare IM give by:    Date Additional Medicare IM Given:    Additional Medicare Important Message give by:     If discussed at [place] of Stay Meetings, dates discussed:    Additional Comments:  Vergie Living, RN 05/20/2015, 10:53 AM

## 2015-05-20 NOTE — H&P (View-Only) (Signed)
6 Days Post-Op Procedure(s) (LRB): Chest wall WOUND VAC CHANGE and removal of chest tubes (N/A) Subjective: Intubated, responds appropriately  Objective: Vital signs in last 24 hours: Temp:  [97.4 F (36.3 C)-98.3 F (36.8 C)] 97.4 F (36.3 C) (10/13 1300) Pulse Rate:  [43-66] 66 (10/13 1535) Cardiac Rhythm:  [-] Sinus bradycardia (10/13 1300) Resp:  [12-31] 23 (10/13 1535) BP: (85-139)/(39-63) 87/47 mmHg (10/13 0700) SpO2:  [98 %-100 %] 100 % (10/13 1535) Arterial Line BP: (96-138)/(37-59) 114/52 mmHg (10/13 1330) FiO2 (%):  [40 %] 40 % (10/13 1535) Weight:  [275 lb 5.7 oz (124.9 kg)] 275 lb 5.7 oz (124.9 kg) (10/13 0530)  Hemodynamic parameters for last 24 hours:    Intake/Output from previous day: 10/12 0701 - 10/13 0700 In: 3029 [I.V.:1029; NG/GT:1600; IV Piggyback:400] Out: 2385 [Urine:2235; Drains:150] Intake/Output this shift: Total I/O In: 940.7 [I.V.:260.7; NG/GT:630; IV Piggyback:50] Out: 160 [Urine:160]  General appearance: cooperative Neurologic: intact Heart: regular rate and rhythm Lungs: diminished breath sounds bibasilar Wound: VAC in place, CT sites clean  Lab Results:  Recent Labs  05/18/15 0456 05/19/15 0443  WBC 6.5 5.8  HGB 8.4* 8.4*  HCT 27.5* 27.6*  PLT 379 385   BMET:  Recent Labs  05/18/15 0456 05/19/15 0443  NA 147* 142  K 3.4* 3.6  CL 103 104  CO2 33* 32  GLUCOSE 118* 118*  BUN 23* 23*  CREATININE 0.73 0.75  CALCIUM 8.7* 8.6*    PT/INR:  Recent Labs  05/19/15 0443  LABPROT 15.4*  INR 1.20   ABG    Component Value Date/Time   PHART 7.452* 05/14/2015 0350   HCO3 32.6* 05/14/2015 0350   TCO2 34 05/14/2015 0350   O2SAT 97.0 05/14/2015 0350   CBG (last 3)   Recent Labs  05/19/15 0343 05/19/15 0835 05/19/15 1235  GLUCAP 96 85 93    Assessment/Plan: S/P Procedure(s) (LRB): Chest wall WOUND VAC CHANGE and removal of chest tubes (N/A) -  Serratia pneumonia- has completed course of IV antibiotics, dc ceftaz  after today's doses Open chest wound- VAC in place, saline wet to dry dressing changes to CT sites For reconstruction tomorrow Hold tube feeds at midnight   LOS: 14 days    Jerry [name] 05/19/2015
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
## 2015-05-20 NOTE — Brief Op Note (Addendum)
      [address]       [place] [address]             [phone]   05/05/2015 - 05/20/2015  2:14 PM  PATIENT:  Jerry [name]  64 y.o. male  PRE-OPERATIVE DIAGNOSIS:  OPEN CHEST WOUND FOLLOWING RESECTION OF CHEST WALL TUMOR  POST-OPERATIVE DIAGNOSIS:  OPEN CHEST WOUND FOLLOWING RESECTION OF CHEST WALL TUMOR   PROCEDURE:  Procedure(s): CHEST WALL RECONSTRUCTION WITH POLYPROPYLENE MESH/ METHYLMETHACRYLATE PROSTHESIS  SURGEON:  Surgeon(s): Loel Lofty Dillingham, DO [name], MD  PHYSICIAN ASSISTANT: WAYNE GOLD PA-C  ANESTHESIA:   general  SPECIMEN:  No Specimen  DISPOSITION OF SPECIMEN:  Pathology  DRAINS: (3) Blake drain(s) in the RIGHT CHEST WALL , 23F BLAKE PLEURAL DRAIN  PATIENT CONDITION:  ICU - intubated and hemodynamically stable.  PRE-OPERATIVE WEIGHT: 126kg  EBL: 876 ML  COMPLICATIONS: NO KNOWN

## 2015-05-20 NOTE — Interval H&P Note (Signed)
History and Physical Interval Note:  05/20/2015 10:05 AM  Jerry [name]  has presented today for surgery, with the diagnosis of RIGHT CHEST WALL MASS  The various methods of treatment have been discussed with the patient and family. After consideration of risks, benefits and other options for treatment, the patient has consented to  Procedure(s): CHEST WALL RECONSTRUCTION (N/A) TRANSVERSE RECTUS ABDOMINIS MYOCUTANEOUS (TRAM) (N/A) as a surgical intervention .  The patient's history has been reviewed, patient examined, no change in status, stable for surgery.  I have reviewed the patient's chart and labs.  Questions were answered to the patient's satisfaction.     [name]
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
## 2015-05-20 NOTE — Op Note (Addendum)
Operative Note   DATE OF OPERATION: 05/20/2015  LOCATION: [facility]   SURGICAL DIVISION: Plastic Surgery  PREOPERATIVE DIAGNOSES:  Large right chest wall defect (12 x 20 cm)   POSTOPERATIVE DIAGNOSES:  same  PROCEDURE:  Muscle and soft tissue advancement with placement of Flex HD 4 x 16 cm for closure of chest wall defect with Acell (500 mg) and VAC placement  SURGEON: Theodoro Kos, DO  ASSISTANT:  Dr. Roxan Hockey  ANESTHESIA:  General.   COMPLICATIONS: None.   INDICATIONS FOR PROCEDURE:  The patient, Jerry [name] is a 64 y.o. male born on [birth year], is here for treatment of large right chest wall defect after resection by Cardiovascular Surgery for tumor of the chest wall. MRN: [id number]  CONSENT:  Informed consent was obtained directly from the patient. Risks, benefits and alternatives were fully discussed. Specific risks including but not limited to bleeding, infection, hematoma, seroma, scarring, pain, infection, contracture, asymmetry, wound healing problems, and need for further surgery were all discussed. The patient did have an ample opportunity to have questions answered to satisfaction.   DESCRIPTION OF PROCEDURE:  The patient was taken to the operating room by the cardiovascular team. SCDs were placed and IV antibiotics were given. The patient's operative site was prepped and draped in a sterile fashion. A time out was performed and all information was confirmed to be correct.  General anesthesia was administered.  The chest wall was stabilized by the team and once complete the patient was surrendered to the plastic surgery team.  All information was confirmed to be correct.  The skin flaps to the right chest were lifted over the pectoralis to gain advancement and a tension free closure.  The same was done over the rectus muscle inferiorly.  The patient was then marked for a VRAM procedure.  The #10 blade was used to make an incision at the midline.   The bovie was used to dissect to the abdominal wall when mesh was encountered.  The incision was closed with 3-0 and 4-0 Monocryl and dermabond was applied over the skin.    The defect was 12 x 20 cm.  The decision was made to continue freeing the soft tissue over the pectorlis major muscle superiorly and laterally.  The inframammary incision was extended laterally and superiorly to gain advancement of the flap in the inferior medial direction.  The Flex HD was placed in the defect over the rib repair.  Slits were made to help with drainage.  The Flex was sutured to the pectoralis and the lower ribs with 2-0 PDS to minimize the deep space.  Vertical mattress and figure eight stitches were utilized.  A lateral and medial drain was placed and sutured to the skin with 4-0 Silk.  The pocket was irrigated with antibiotic solution.  Hemostasis was achieved with electrocautery.  The skin and soft tissue was then advanced inferiorly and medially  For 10 x 10 cm and secured with 2-0 Monocryl and 3-0 Monocryl.  Additional 4-0 Monocryl was utilized to obtain closure.  Acell powder (500 mg) was placed in the three previous drain sites and covered with adaptic and surgical gel.  The VAC was placed over the chest incision and previous drain sites.  An excellent seal was obtained with ioban.   A mepilex border dressing was applied to the abdominal incision.  The patient tolerated the procedure well.  An abdominal and breast binder were placed.  There were no complications. The patient was allowed  to wake from anesthesia, extubated and taken to the recovery room in satisfactory condition.

## 2015-05-20 NOTE — Anesthesia Preprocedure Evaluation (Signed)
Anesthesia Evaluation  Patient identified by MRN, date of birth, ID band Patient awake    Reviewed: Allergy & Precautions, NPO status , Patient's Chart, lab work & pertinent test results  Airway Mallampati: Intubated       Dental   Pulmonary Current Smoker,     + decreased breath sounds      Cardiovascular hypertension,  Rhythm:Regular Rate:Normal     Neuro/Psych    GI/Hepatic   Endo/Other    Renal/GU      Musculoskeletal   Abdominal   Peds  Hematology   Anesthesia Other Findings   Reproductive/Obstetrics                             Anesthesia Physical Anesthesia Plan  ASA: III  Anesthesia Plan: General   Post-op Pain Management:    Induction: Intravenous  Airway Management Planned: Double Lumen EBT  Additional Equipment: Arterial line and CVP  Intra-op Plan:   Post-operative Plan: Post-operative intubation/ventilation  Informed Consent: I have reviewed the patients History and Physical, chart, labs and discussed the procedure including the risks, benefits and alternatives for the proposed anesthesia with the patient or authorized representative who has indicated his/her understanding and acceptance.     Plan Discussed with: Anesthesiologist and CRNA  Anesthesia Plan Comments:         Anesthesia Quick Evaluation

## 2015-05-20 NOTE — Interval H&P Note (Signed)
History and Physical Interval Note:  05/20/2015 7:25 AM  Jerry [name]  has presented today for surgery, with the diagnosis of RIGHT CHEST WALL MASS  The various methods of treatment have been discussed with the patient and family. After consideration of risks, benefits and other options for treatment, the patient has consented to  Procedure(s): CHEST WALL RECONSTRUCTION (N/A) TRANSVERSE RECTUS ABDOMINIS MYOCUTANEOUS (TRAM) (N/A) as a surgical intervention .  The patient's history has been reviewed, patient examined, no change in status, stable for surgery.  I have reviewed the patient's chart and labs.  Questions were answered to the patient's satisfaction.     Wallace Going

## 2015-05-20 NOTE — Brief Op Note (Signed)
05/05/2015 - 05/20/2015  3:28 PM  PATIENT:  Jerry [name]  64 y.o. male  PRE-OPERATIVE DIAGNOSIS:  RIGHT CHEST WALL MASS  POST-OPERATIVE DIAGNOSIS:  RIGHT CHEST WALL MASS  PROCEDURE:  Procedure(s): Right CHEST WALL RECONSTRUCTION (N/A) TISSUE ADVANCEMENT OF CHEST WALL WITH PLACEMENT OF FLEX HD FOR RECONSTRUCTION (N/A)  SURGEON:  Surgeon(s) and Role: Panel 1:    * [name], MD - Primary  Panel 2:    * Loel Lofty Dillingham, DO - Primary  ASSISTANTS: none   ANESTHESIA:   general  EBL:  Total I/O In: 2126.4 [I.V.:1086.4; Blood:670; NG/GT:120; IV Piggyback:250] Out: 1420 [Urine:670; Blood:750]  BLOOD ADMINISTERED:none  DRAINS: (2 in the right chest) Jackson-Pratt drain(s) with closed bulb suction in the right chest   LOCAL MEDICATIONS USED:  NONE  SPECIMEN:  No Specimen  DISPOSITION OF SPECIMEN:  N/A  COUNTS:  YES  TOURNIQUET:  * No tourniquets in log *  DICTATION: .Dragon Dictation  PLAN OF CARE: Admit to inpatient  PATIENT DISPOSITION:  PACU - hemodynamically stable.   Delay start of Pharmacological VTE agent (>24hrs) due to surgical blood loss or risk of bleeding: no

## 2015-05-20 NOTE — Progress Notes (Signed)
Day of Surgery Procedure(s) (LRB): Right CHEST WALL RECONSTRUCTION (N/A) TISSUE ADVANCEMENT OF CHEST WALL WITH PLACEMENT OF FLEX HD FOR RECONSTRUCTION (N/A) Subjective: Intubated, sedated  Objective: Vital signs in last 24 hours: Temp:  [98.3 F (36.8 C)-99.2 F (37.3 C)] 99.2 F (37.3 C) (10/14 1605) Pulse Rate:  [43-63] 50 (10/14 0900) Cardiac Rhythm:  [-] Sinus bradycardia (10/14 0800) Resp:  [13-26] 16 (10/14 0900) BP: (110-136)/(48-55) 136/55 mmHg (10/14 1604) SpO2:  [96 %-100 %] 100 % (10/14 0900) Arterial Line BP: (90-148)/(40-60) 110/45 mmHg (10/14 0900) FiO2 (%):  [40 %] 40 % (10/14 1605) Weight:  [278 lb 7.1 oz (126.3 kg)] 278 lb 7.1 oz (126.3 kg) (10/14 0600)  Hemodynamic parameters for last 24 hours:    Intake/Output from previous day: 10/13 0701 - 10/14 0700 In: 2484.1 [I.V.:1124.1; NG/GT:1310; IV Piggyback:50] Out: 2680 [Urine:2410; Emesis/NG output:20; Drains:250] Intake/Output this shift: Total I/O In: 3326.4 [I.V.:2286.4; Blood:670; NG/GT:120; IV Piggyback:250] Out: 1600 [Urine:750; Blood:750; Chest Tube:100]  General appearance: sedated Heart: regular rate and rhythm Lungs: diminished breath sounds bibasilar Wound: dressed  Lab Results:  Recent Labs  05/18/15 0456 05/19/15 0443  WBC 6.5 5.8  HGB 8.4* 8.4*  HCT 27.5* 27.6*  PLT 379 385   BMET:  Recent Labs  05/18/15 0456 05/19/15 0443  NA 147* 142  K 3.4* 3.6  CL 103 104  CO2 33* 32  GLUCOSE 118* 118*  BUN 23* 23*  CREATININE 0.73 0.75  CALCIUM 8.7* 8.6*    PT/INR:  Recent Labs  05/19/15 0443  LABPROT 15.4*  INR 1.20   ABG    Component Value Date/Time   PHART 7.452* 05/14/2015 0350   HCO3 32.6* 05/14/2015 0350   TCO2 34 05/14/2015 0350   O2SAT 97.0 05/14/2015 0350   CBG (last 3)   Recent Labs  05/19/15 2331 05/20/15 0430 05/20/15 0859  GLUCAP 93 92 86    Assessment/Plan: S/P Procedure(s) (LRB): Right CHEST WALL RECONSTRUCTION (N/A) TISSUE ADVANCEMENT OF CHEST  WALL WITH PLACEMENT OF FLEX HD FOR RECONSTRUCTION (N/A) s/p CW reconstruction  keep sedated and on ventilator overnight Keep HOB elevated Panda dislodged in OR, replace Vanco for periop prophylaxis SCD + enoxaparin for DVT prophylaxis   LOS: 15 days    [name] 05/20/2015

## 2015-05-20 NOTE — Anesthesia Postprocedure Evaluation (Signed)
  Anesthesia Post-op Note  Patient: Jerry [name]  Procedure(s) Performed: Procedure(s): Right CHEST WALL RECONSTRUCTION (N/A) TISSUE ADVANCEMENT OF CHEST WALL WITH PLACEMENT OF FLEX HD FOR RECONSTRUCTION (N/A)  Patient Location: SICU  Anesthesia Type:General  Level of Consciousness: sedated and Patient remains intubated per anesthesia plan  Airway and Oxygen Therapy: Patient remains intubated per anesthesia plan and Patient placed on Ventilator (see vital sign flow sheet for setting)  Post-op Pain: none  Post-op Assessment: Post-op Vital signs reviewed, Patient's Cardiovascular Status Stable, Respiratory Function Stable, Patent Airway and Pain level controlled LLE Motor Response: Purposeful movement, Responds to commands   RLE Motor Response: Purposeful movement, Responds to commands        Post-op Vital Signs: stable  Last Vitals:  Filed Vitals:   05/20/15 1605  BP:   Pulse:   Temp: 37.3 C  Resp:     Complications: No apparent anesthesia complications

## 2015-05-20 NOTE — Transfer of Care (Signed)
Immediate Anesthesia Transfer of Care Note  Patient: COLESTON DIROSA  Procedure(s) Performed: Procedure(s): Right CHEST WALL RECONSTRUCTION (N/A) TISSUE ADVANCEMENT OF CHEST WALL WITH PLACEMENT OF FLEX HD FOR RECONSTRUCTION (N/A)  Patient Location: SICU  Anesthesia Type:General  Level of Consciousness: sedated, unresponsive and Patient remains intubated per anesthesia plan  Airway & Oxygen Therapy: Patient remains intubated per anesthesia plan and Patient placed on Ventilator (see vital sign flow sheet for setting)  Post-op Assessment: Report given to RN and Post -op Vital signs reviewed and stable  Post vital signs: Reviewed and stable  Last Vitals:  Filed Vitals:   05/20/15 1604  BP: 136/55  Pulse:   Temp:   Resp:     Complications: No apparent anesthesia complications

## 2015-05-20 NOTE — Anesthesia Procedure Notes (Addendum)
Procedure Name: Intubation Date/Time: 05/20/2015 10:27 AM Performed by: Neldon Newport Pre-anesthesia Checklist: Patient being monitored, Suction available, Emergency Drugs available, Patient identified and Timeout performed Patient Re-evaluated:Patient Re-evaluated prior to inductionOxygen Delivery Method: Circle system utilized Preoxygenation: Pre-oxygenation with 100% oxygen (Tube exchanger down existing ETT.  Exchanged for 37Fr. DLT) Endobronchial tube: Left and Double lumen EBT and 37 Fr Number of attempts: 2 Placement Confirmation: positive ETCO2 and breath sounds checked- equal and bilateral Tube secured with: Tape Dental Injury: Teeth and Oropharynx as per pre-operative assessment    Procedure Name: Intubation Date/Time: 05/20/2015 3:35 PM Performed by: Greggory Stallion, Pieper L Pre-anesthesia Checklist: Patient identified, Emergency Drugs available, Suction available, Patient being monitored and Timeout performed Patient Re-evaluated:Patient Re-evaluated prior to inductionOxygen Delivery Method: Circle system utilized Preoxygenation: Pre-oxygenation with 100% oxygen Intubation Type: Inhalational induction with existing ETT Laryngoscope Size: Mac and 4 Grade View: Grade I Tube type: Subglottic suction tube Tube size: 8.0 mm Number of attempts: 1 Airway Equipment and Method: Bougie stylet Placement Confirmation: ETT inserted through vocal cords under direct vision,  positive ETCO2 and breath sounds checked- equal and bilateral Secured at: 23 cm Tube secured with: Tape Dental Injury: Teeth and Oropharynx as per pre-operative assessment  Comments: ETT exchanged over stylet under DV s difficulty

## 2015-05-21 ENCOUNTER — Inpatient Hospital Stay (HOSPITAL_COMMUNITY): Payer: Medicare HMO

## 2015-05-21 LAB — BASIC METABOLIC PANEL
Anion gap: 7 (ref 5–15)
BUN: 14 mg/dL (ref 6–20)
CALCIUM: 8 mg/dL — AB (ref 8.9–10.3)
CO2: 27 mmol/L (ref 22–32)
CREATININE: 0.77 mg/dL (ref 0.61–1.24)
Chloride: 103 mmol/L (ref 101–111)
GFR calc non Af Amer: >60 mL/min (ref >60)
GLUCOSE: 129 mg/dL — AB (ref 65–99)
Potassium: 4.1 mmol/L (ref 3.5–5.1)
Sodium: 137 mmol/L (ref 135–145)

## 2015-05-21 LAB — CBC
HEMATOCRIT: 32.5 % — AB (ref 39.0–52.0)
Hemoglobin: 10.5 g/dL — ABNORMAL LOW (ref 13.0–17.0)
MCH: 29.7 pg (ref 26.0–34.0)
MCHC: 32.3 g/dL (ref 30.0–36.0)
MCV: 92.1 fL (ref 78.0–100.0)
Platelets: 379 10*3/uL (ref 150–400)
RBC: 3.53 MIL/uL — ABNORMAL LOW (ref 4.22–5.81)
RDW: 15.4 % (ref 11.5–15.5)
WBC: 13.3 10*3/uL — ABNORMAL HIGH (ref 4.0–10.5)

## 2015-05-21 LAB — GLUCOSE, CAPILLARY
GLUCOSE-CAPILLARY: 117 mg/dL — AB (ref 65–99)
GLUCOSE-CAPILLARY: 121 mg/dL — AB (ref 65–99)
GLUCOSE-CAPILLARY: 95 mg/dL (ref 65–99)
GLUCOSE-CAPILLARY: 119 mg/dL — AB (ref 65–99)
Glucose-Capillary: 111 mg/dL — ABNORMAL HIGH (ref 65–99)
Glucose-Capillary: 110 mg/dL — ABNORMAL HIGH (ref 65–99)
Glucose-Capillary: 147 mg/dL — ABNORMAL HIGH (ref 65–99)
Glucose-Capillary: 112 mg/dL — ABNORMAL HIGH (ref 65–99)

## 2015-05-21 LAB — POCT I-STAT 3, ART BLOOD GAS (G3+)
Acid-Base Excess: 2 mmol/L (ref 0.0–2.0)
BICARBONATE: 26.1 meq/L — AB (ref 20.0–24.0)
O2 Saturation: 95 %
PCO2 ART: 39.6 mmHg (ref 35.0–45.0)
PO2 ART: 80 mmHg (ref 80.0–100.0)
Patient temperature: 100.9
TCO2: 27 mmol/L (ref 0–100)
pH, Arterial: 7.433 (ref 7.350–7.450)

## 2015-05-21 LAB — TRIGLYCERIDES: Triglycerides: 278 mg/dL — ABNORMAL HIGH (ref <150)

## 2015-05-21 NOTE — Progress Notes (Signed)
1 Day Post-Op Procedure(s) (LRB): Right CHEST WALL RECONSTRUCTION (N/A) TISSUE ADVANCEMENT OF CHEST WALL WITH PLACEMENT OF FLEX HD FOR RECONSTRUCTION (N/A) Subjective: Intubated but alert Follows commands Denies pain  Objective: Vital signs in last 24 hours: Temp:  [98.3 F (36.8 C)-100.9 F (38.3 C)] 100.9 F (38.3 C) (10/15 0402) Pulse Rate:  [50-85] 64 (10/15 0800) Cardiac Rhythm:  [-] Normal sinus rhythm (10/15 0800) Resp:  [14-30] 16 (10/15 0800) BP: (88-140)/(32-65) 100/54 mmHg (10/15 0800) SpO2:  [93 %-100 %] 94 % (10/15 0800) Arterial Line BP: (92-163)/(45-80) 108/47 mmHg (10/15 0800) FiO2 (%):  [40 %] 40 % (10/15 0800)  Hemodynamic parameters for last 24 hours:    Intake/Output from previous day: 10/14 0701 - 10/15 0700 In: 5869.1 [I.V.:4519.1; Blood:670; NG/GT:180; IV Piggyback:500] Out: 2840 [Urine:1740; Drains:120; Blood:750; Chest Tube:230] Intake/Output this shift: Total I/O In: 408.2 [I.V.:158.2; IV Piggyback:250] Out: 250 [Urine:250]  General appearance: alert, cooperative and no distress Neurologic: intact Heart: regular rate and rhythm Lungs: clear anteriorly Abdomen: normal findings: soft, non-tender Wound: vac dressing in place  Lab Results:  Recent Labs  05/19/15 0443  05/20/15 1317 05/21/15 0422  WBC 5.8  --   --  13.3*  HGB 8.4*  < > 9.9* 10.5*  HCT 27.6*  < > 29.0* 32.5*  PLT 385  --   --  379  < > = values in this interval not displayed. BMET:  Recent Labs  05/19/15 0443 05/20/15 1056  05/20/15 1317 05/21/15 0422  NA 142 144  < > 144 137  K 3.6 3.9  < > 4.1 4.1  CL 104  --   --   --  103  CO2 32  --   --   --  27  GLUCOSE 118* 124*  --   --  129*  BUN 23*  --   --   --  14  CREATININE 0.75  --   --   --  0.77  CALCIUM 8.6*  --   --   --  8.0*  < > = values in this interval not displayed.  PT/INR:  Recent Labs  05/19/15 0443  LABPROT 15.4*  INR 1.20   ABG    Component Value Date/Time   PHART 7.433 05/21/2015 0456    HCO3 26.1* 05/21/2015 0456   TCO2 27 05/21/2015 0456   O2SAT 95.0 05/21/2015 0456   CBG (last 3)   Recent Labs  05/20/15 1950 05/20/15 2329 05/21/15 0400  GLUCAP 111* 117* 121*    Assessment/Plan: S/P Procedure(s) (LRB): Right CHEST WALL RECONSTRUCTION (N/A) TISSUE ADVANCEMENT OF CHEST WALL WITH PLACEMENT OF FLEX HD FOR RECONSTRUCTION (N/A) -  NEURO- intact  CV- stable  RESP_ chest xray shows improvement of bibasilar atelectasis/ consolidation  Oxygenation OK,  Will try PS wean today  RENAL- creatinine and lytes OK   ENDO- CBG well controlled  Nutrition- Panda dislodged yesterday- new tube is in distal esophagus- do not want to feed with tube there, will hold off on Tf and see how he does with vent wean  ID- periop vanco- no evidence of infection currently  SCD + enoxaparin for DVT prophylaxis   LOS: 16 days    [name] 05/21/2015

## 2015-05-21 NOTE — Progress Notes (Signed)
1 Day Post-Op  Subjective: Post op Day 1 from chest wall reconstruction.  The VAC and drains are in place with serosanginous fluid as expected.  Abdominal and chest binder are in place.  Objective: Vital signs in last 24 hours: Temp:  [98.4 F (36.9 C)-100.9 F (38.3 C)] 98.4 F (36.9 C) (10/15 0900) Pulse Rate:  [58-85] 79 (10/15 1100) Resp:  [14-30] 22 (10/15 1100) BP: (88-140)/(32-65) 130/62 mmHg (10/15 1100) SpO2:  [93 %-100 %] 95 % (10/15 1100) Arterial Line BP: (92-163)/(47-80) 149/59 mmHg (10/15 1100) FiO2 (%):  [40 %] 40 % (10/15 1100) Last BM Date:  (prior to admission)  Intake/Output from previous day: 10/14 0701 - 10/15 0700 In: 5869.1 [I.V.:4519.1; Blood:670; NG/GT:180; IV Piggyback:500] Out: 2840 [Urine:1740; Drains:120; Blood:750; Chest Tube:230] Intake/Output this shift: Total I/O In: 811.3 [I.V.:561.3; IV Piggyback:250] Out: 250 [Urine:250]  General appearance: mild sedation but opens eyes.  Still on the Vent but being prepared for hopeful extubation  Lab Results:   Recent Labs  05/19/15 0443  05/20/15 1317 05/21/15 0422  WBC 5.8  --   --  13.3*  HGB 8.4*  < > 9.9* 10.5*  HCT 27.6*  < > 29.0* 32.5*  PLT 385  --   --  379  < > = values in this interval not displayed. BMET  Recent Labs  05/19/15 0443 05/20/15 1056  05/20/15 1317 05/21/15 0422  NA 142 144  < > 144 137  K 3.6 3.9  < > 4.1 4.1  CL 104  --   --   --  103  CO2 32  --   --   --  27  GLUCOSE 118* 124*  --   --  129*  BUN 23*  --   --   --  14  CREATININE 0.75  --   --   --  0.77  CALCIUM 8.6*  --   --   --  8.0*  < > = values in this interval not displayed. PT/INR  Recent Labs  05/19/15 0443  LABPROT 15.4*  INR 1.20   ABG  Recent Labs  05/20/15 1317 05/21/15 0456  PHART 7.415 7.433  HCO3 30.6* 26.1*    Studies/Results: Dg Chest Port 1 View  05/21/2015  CLINICAL DATA:  Chest wall mass, feeding tube EXAM: PORTABLE CHEST 1 VIEW COMPARISON:  05/18/2015 FINDINGS:  Endotracheal tube terminates 5.5 cm above the carina. Mild patchy bilateral lower lobe opacities, possibly atelectasis. Suspected small right pleural effusion. Left costophrenic angle is excluded. Right IJ venous catheter terminates in the lower SVC. Weighted feeding tube terminates in the distal esophagus. Additional enteric tube courses below the diaphragm. IMPRESSION: Endotracheal tube terminates 5.5 cm above the carina. Mild patchy bilateral lower lobe opacities, possibly atelectasis. Suspected small right pleural effusion. Additional support apparatus as above. Electronically Signed   By: Julian Hy M.D.   On: 05/21/2015 07:38   Dg Abd Portable 1v  05/21/2015  CLINICAL DATA:  Feeding tube, chest wall mass EXAM: PORTABLE ABDOMEN - 1 VIEW COMPARISON:  05/20/2015 FINDINGS: Weighted feeding tube terminates in the distal esophagus. Additional enteric tube terminates in the gastric cardia. Nonobstructive bowel gas pattern. Surgical clips in the right upper abdomen. IMPRESSION: Weighted feeding tube terminates in the distal esophagus. Additional enteric tube terminates in the gastric cardia. Electronically Signed   By: Julian Hy M.D.   On: 05/21/2015 07:36   Dg Abd Portable 1v  05/20/2015  CLINICAL DATA:  Feeding tube placement EXAM: PORTABLE ABDOMEN -  1 VIEW COMPARISON:  05/10/2015 FINDINGS: There is a feeding tube with the metallic tip projecting over the fundus of the stomach. There is no bowel dilatation to suggest obstruction. There is no evidence of pneumoperitoneum, portal venous gas or pneumatosis. There are no pathologic calcifications along the expected course of the ureters. The osseous structures are unremarkable. IMPRESSION: Feeding tube with the metallic tip projecting over the fundus of the stomach. Electronically Signed   By: Kathreen Devoid   On: 05/20/2015 19:53    Anti-infectives: Anti-infectives    Start     Dose/Rate Route Frequency Ordered Stop   05/20/15 2000   vancomycin (VANCOCIN) 1,250 mg in sodium chloride 0.9 % 250 mL IVPB     1,250 mg 166.7 mL/hr over 90 Minutes Intravenous Every 12 hours 05/20/15 1638     05/20/15 1253  polymyxin B 500,000 Units, bacitracin 50,000 Units in sodium chloride irrigation 0.9 % 500 mL irrigation  Status:  Discontinued       As needed 05/20/15 1253 05/20/15 1542   05/20/15 1045  vancomycin (VANCOCIN) IVPB 1000 mg/200 mL premix  Status:  Discontinued     1,000 mg 200 mL/hr over 60 Minutes Intravenous Every 12 hours 05/20/15 1032 05/20/15 1553   05/12/15 2200  cefTRIAXone (ROCEPHIN) 1 g in dextrose 5 % 50 mL IVPB  Status:  Discontinued     1 g 100 mL/hr over 30 Minutes Intravenous Every 12 hours 05/12/15 1709 05/12/15 1812   05/12/15 1830  cefTAZidime (FORTAZ) 2 g in dextrose 5 % 50 mL IVPB     2 g 100 mL/hr over 30 Minutes Intravenous 3 times per day 05/12/15 1812 05/19/15 2139   05/11/15 2300  vancomycin (VANCOCIN) IVPB 1000 mg/200 mL premix     1,000 mg 200 mL/hr over 60 Minutes Intravenous  Once 05/11/15 1309 05/12/15 0000   05/09/15 0800  vancomycin (VANCOCIN) IVPB 1000 mg/200 mL premix     1,000 mg 200 mL/hr over 60 Minutes Intravenous Every 12 hours 05/09/15 0747 05/09/15 0951   05/09/15 0800  cefUROXime (ZINACEF) 1.5 g in dextrose 5 % 50 mL IVPB     1.5 g 100 mL/hr over 30 Minutes Intravenous Every 12 hours 05/09/15 0747 05/09/15 2035   05/05/15 2000  vancomycin (VANCOCIN) IVPB 1000 mg/200 mL premix     1,000 mg 200 mL/hr over 60 Minutes Intravenous Every 12 hours 05/05/15 1347 05/05/15 2113   05/05/15 0630  vancomycin (VANCOCIN) 1,500 mg in sodium chloride 0.9 % 500 mL IVPB     1,500 mg 250 mL/hr over 120 Minutes Intravenous To [facility] 05/04/15 1059 05/05/15 0806      Assessment/Plan: s/p Procedure(s): Right CHEST WALL RECONSTRUCTION (N/A) TISSUE ADVANCEMENT OF CHEST WALL WITH PLACEMENT OF FLEX HD FOR RECONSTRUCTION (N/A) Continue to monitor, head of bed elevated 45 degrees.  LOS: 16  days    Wallace Going 05/21/2015

## 2015-05-21 NOTE — Progress Notes (Signed)
      [address]       ,[place] [address]             [phone]      Sleeping on vent  Just put back on support after spending all day on PS  BP 123/93 mmHg  Pulse 68  Temp(Src) 99.1 F (37.3 C) (Axillary)  Resp 20  Ht 6\' 2"  (1.88 m)  Wt 278 lb 7.1 oz (126.3 kg)  BMI 35.73 kg/m2  SpO2 97%   Intake/Output Summary (Last 24 hours) at 05/21/15 1942 Last data filed at 05/21/15 1900  Gross per 24 hour  Intake 3810.36 ml  Output   2110 ml  Net 1700.36 ml    Fairly positive will decrease IVF  Remo Lipps C. Roxan Hockey, MD [facility] [phone]

## 2015-05-22 ENCOUNTER — Inpatient Hospital Stay (HOSPITAL_COMMUNITY): Payer: Medicare HMO

## 2015-05-22 LAB — CBC
HEMATOCRIT: 30.7 % — AB (ref 39.0–52.0)
Hemoglobin: 9.4 g/dL — ABNORMAL LOW (ref 13.0–17.0)
MCH: 28.6 pg (ref 26.0–34.0)
MCHC: 30.6 g/dL (ref 30.0–36.0)
MCV: 93.3 fL (ref 78.0–100.0)
PLATELETS: 353 10*3/uL (ref 150–400)
RBC: 3.29 MIL/uL — AB (ref 4.22–5.81)
RDW: 15.5 % (ref 11.5–15.5)
WBC: 11.9 10*3/uL — AB (ref 4.0–10.5)

## 2015-05-22 LAB — POCT I-STAT 3, ART BLOOD GAS (G3+)
ACID-BASE EXCESS: 2 mmol/L (ref 0.0–2.0)
BICARBONATE: 27.9 meq/L — AB (ref 20.0–24.0)
O2 Saturation: 94 %
TCO2: 29 mmol/L (ref 0–100)
pCO2 arterial: 48.8 mmHg — ABNORMAL HIGH (ref 35.0–45.0)
pH, Arterial: 7.367 (ref 7.350–7.450)
pO2, Arterial: 75 mmHg — ABNORMAL LOW (ref 80.0–100.0)

## 2015-05-22 LAB — GLUCOSE, CAPILLARY
GLUCOSE-CAPILLARY: 107 mg/dL — AB (ref 65–99)
GLUCOSE-CAPILLARY: 107 mg/dL — AB (ref 65–99)
Glucose-Capillary: 106 mg/dL — ABNORMAL HIGH (ref 65–99)
Glucose-Capillary: 111 mg/dL — ABNORMAL HIGH (ref 65–99)
Glucose-Capillary: 92 mg/dL (ref 65–99)

## 2015-05-22 LAB — BASIC METABOLIC PANEL
Anion gap: 8 (ref 5–15)
BUN: 15 mg/dL (ref 6–20)
CHLORIDE: 109 mmol/L (ref 101–111)
CO2: 28 mmol/L (ref 22–32)
CREATININE: 0.85 mg/dL (ref 0.61–1.24)
Calcium: 8.4 mg/dL — ABNORMAL LOW (ref 8.9–10.3)
Glucose, Bld: 121 mg/dL — ABNORMAL HIGH (ref 65–99)
POTASSIUM: 3.9 mmol/L (ref 3.5–5.1)
SODIUM: 145 mmol/L (ref 135–145)

## 2015-05-22 MED ORDER — TRAZODONE HCL 150 MG PO TABS
150.0000 mg | ORAL_TABLET | Freq: Every day | ORAL | Status: DC
  Administered 2015-05-23: 150 mg via ORAL
  Filled 2015-05-22 (×4): qty 1

## 2015-05-22 MED ORDER — FENTANYL CITRATE (PF) 100 MCG/2ML IJ SOLN
250.0000 ug | INTRAMUSCULAR | Status: DC | PRN
  Administered 2015-05-22 – 2015-05-23 (×3): 250 ug via INTRAVENOUS
  Administered 2015-05-23: 100 ug via INTRAVENOUS
  Filled 2015-05-22 (×4): qty 6

## 2015-05-22 MED ORDER — PROMETHAZINE HCL 25 MG PO TABS: 25.0000 mg | ORAL_TABLET | Freq: Four times a day (QID) | ORAL | Status: DC | PRN

## 2015-05-22 MED ORDER — FUROSEMIDE 10 MG/ML IJ SOLN
40.0000 mg | Freq: Once | INTRAMUSCULAR | Status: AC
  Administered 2015-05-22: 40 mg via INTRAVENOUS
  Filled 2015-05-22: qty 4

## 2015-05-22 MED ORDER — OXYCODONE HCL 5 MG PO TABS
30.0000 mg | ORAL_TABLET | ORAL | Status: DC | PRN
  Administered 2015-05-22: 20 mg via ORAL
  Filled 2015-05-22: qty 6

## 2015-05-22 MED ORDER — CHLORHEXIDINE GLUCONATE 0.12 % MT SOLN
15.0000 mL | Freq: Two times a day (BID) | OROMUCOSAL | Status: DC
Start: 2015-05-22 — End: 2015-05-23
  Administered 2015-05-23: 15 mL via OROMUCOSAL

## 2015-05-22 MED ORDER — CETYLPYRIDINIUM CHLORIDE 0.05 % MT LIQD: 7.0000 mL | Freq: Two times a day (BID) | OROMUCOSAL | Status: DC

## 2015-05-22 MED ORDER — ALBUTEROL SULFATE (2.5 MG/3ML) 0.083% IN NEBU
2.5000 mg | INHALATION_SOLUTION | Freq: Four times a day (QID) | RESPIRATORY_TRACT | Status: DC
  Administered 2015-05-22 – 2015-05-25 (×12): 2.5 mg via RESPIRATORY_TRACT
  Filled 2015-05-22 (×13): qty 3

## 2015-05-22 NOTE — Plan of Care (Signed)
Problem: Phase II Progression Outcomes Goal: Date pt extubated/weaned off vent Outcome: Not Applicable Date Met:  73/71/06 Pt extubated 05/22/15 at 1030

## 2015-05-22 NOTE — Procedures (Signed)
Extubation Procedure Note  Patient Details:   Name: Jerry [name] DOB: Dec 15, 1950 MRN: [id number]   Airway Documentation:  Airway 8 mm (Active)  Secured at (cm) 23 cm 05/22/2015  8:00 AM  Measured From Lips 05/22/2015  8:00 AM  Secured Location Left 05/22/2015  8:00 AM  Secured By [employer] 05/22/2015  8:00 AM  Tube Holder Repositioned Yes 05/22/2015  8:00 AM  Cuff Pressure (cm H2O) 22 cm H2O 05/22/2015  8:00 AM  Site Condition Dry 05/22/2015  8:00 AM   nif 30, vc 664ml, incentive spirometer 500cc's. Placed on 4lpm [place]chler no distress noted.  Evaluation  O2 sats: stable throughout Complications: No apparent complications Patient did tolerate procedure well. Bilateral Breath Sounds: Diminished Suctioning: Airway Yes  Donella Stade 05/22/2015, 10:31 AM

## 2015-05-22 NOTE — Progress Notes (Signed)
2 Days Post-Op Procedure(s) (LRB): Right CHEST WALL RECONSTRUCTION (N/A) TISSUE ADVANCEMENT OF CHEST WALL WITH PLACEMENT OF FLEX HD FOR RECONSTRUCTION (N/A) Subjective: Alert on ventilator Affirmative to pain  Objective: Vital signs in last 24 hours: Temp:  [98 F (36.7 C)-99.1 F (37.3 C)] 99 F (37.2 C) (10/16 0800) Pulse Rate:  [58-103] 77 (10/16 0900) Cardiac Rhythm:  [-] Normal sinus rhythm (10/16 0800) Resp:  [15-28] 21 (10/16 0900) BP: (92-148)/(40-93) 126/55 mmHg (10/16 0900) SpO2:  [90 %-100 %] 97 % (10/16 0900) Arterial Line BP: (76-161)/(48-67) 139/62 mmHg (10/16 0900) FiO2 (%):  [40 %] 40 % (10/16 0800) Weight:  [285 lb 0.9 oz (129.3 kg)] 285 lb 0.9 oz (129.3 kg) (10/16 0600)  Hemodynamic parameters for last 24 hours:    Intake/Output from previous day: 10/15 0701 - 10/16 0700 In: 2776.3 [I.V.:2246.3; NG/GT:30; IV Piggyback:500] Out: 2730 [Urine:2225; Emesis/NG output:100; Drains:80; Chest Tube:325] Intake/Output this shift: Total I/O In: 440 [I.V.:160; NG/GT:30; IV Piggyback:250] Out: 225 [Urine:225]  General appearance: alert and cooperative Neurologic: intact Heart: regular rate and rhythm Lungs: mild rhonchi bilaterally Wound: dressing in place  Lab Results:  Recent Labs  05/21/15 0422 05/22/15 0400  WBC 13.3* 11.9*  HGB 10.5* 9.4*  HCT 32.5* 30.7*  PLT 379 353   BMET:  Recent Labs  05/21/15 0422 05/22/15 0400  NA 137 145  K 4.1 3.9  CL 103 109  CO2 27 28  GLUCOSE 129* 121*  BUN 14 15  CREATININE 0.77 0.85  CALCIUM 8.0* 8.4*    PT/INR: No results for input(s): LABPROT, INR in the last 72 hours. ABG    Component Value Date/Time   PHART 7.433 05/21/2015 0456   HCO3 26.1* 05/21/2015 0456   TCO2 27 05/21/2015 0456   O2SAT 95.0 05/21/2015 0456   CBG (last 3)   Recent Labs  05/21/15 2321 05/22/15 0331 05/22/15 0816  GLUCAP 119* 106* 107*    Assessment/Plan: S/P Procedure(s) (LRB): Right CHEST WALL RECONSTRUCTION  (N/A) TISSUE ADVANCEMENT OF CHEST WALL WITH PLACEMENT OF FLEX HD FOR RECONSTRUCTION (N/A) -  CV- stable  RESP- VDRF, he looks like he may be ready to extubate  Check ABg and parameters  RENAL- creatinine OK  Will give 40 mg lasix this AM  ENDO- CBG OK  Nutrition- panda removed last night, Tf on hold  Will try to extubate this AM  If unable to extubate will need new feeding tube  ID afebrile, WBC dropping on vanco for wound   LOS: 17 days    [name] 05/22/2015

## 2015-05-22 NOTE — Plan of Care (Signed)
Problem: Phase I Progression Outcomes Goal: Patient tolerating weaning plan Outcome: Progressing Pt weaned well all day today

## 2015-05-22 NOTE — Progress Notes (Signed)
      [address]       [place] [address]             [phone]      No complaints  RN noted some difficulty with swallowing pills  BP 129/67 mmHg  Pulse 73  Temp(Src) 98.3 F (36.8 C) (Oral)  Resp 22  Ht 6\' 2"  (1.88 m)  Wt 285 lb 0.9 oz (129.3 kg)  BMI 36.58 kg/m2  SpO2 95%   Intake/Output Summary (Last 24 hours) at 05/22/15 1723 Last data filed at 05/22/15 1700  Gross per 24 hour  Intake   2220 ml  Output   3985 ml  Net  -1765 ml    Doing well off vent  Reassess swallowing in AM  Margo Lama C. Roxan Hockey, MD [facility] [phone]

## 2015-05-22 NOTE — Progress Notes (Signed)
ANTIBIOTIC CONSULT NOTE  Pharmacy Consult for Vancomycin  Indication: surgical prophylaxis / chest  wound  Allergies  Allergen Reactions  . Ciprofloxacin Anaphylaxis  . Penicillins Rash  . Tramadol Hcl Rash   Labs:  Recent Labs  05/20/15 1317 05/21/15 0422 05/22/15 0400  WBC  --  13.3* 11.9*  HGB 9.9* 10.5* 9.4*  PLT  --  379 353  CREATININE  --  0.77 0.85   Estimated Creatinine Clearance: 127.1 mL/min (by C-G formula based on Cr of 0.85). No results for input(s): VANCOTROUGH, VANCOPEAK, VANCORANDOM, GENTTROUGH, GENTPEAK, GENTRANDOM, TOBRATROUGH, TOBRAPEAK, TOBRARND, AMIKACINPEAK, AMIKACINTROU, AMIKACIN in the last 72 hours.   Microbiology: Recent Results (from the past 720 hour(s))  Surgical pcr screen     Status: None   Collection Time: 05/02/15  2:11 PM  Result Value Ref Range Status   MRSA, PCR NEGATIVE NEGATIVE Final   Staphylococcus aureus NEGATIVE NEGATIVE Final    Comment:        The Xpert SA Assay (FDA approved for NASAL specimens in patients over 57 years of age), is one component of a comprehensive surveillance program.  Test performance has been validated by [facility] for patients greater than or equal to 41 year old. It is not intended to diagnose infection nor to guide or monitor treatment.   Culture, blood (routine x 2)     Status: None   Collection Time: 05/07/15 12:30 AM  Result Value Ref Range Status   Specimen Description BLOOD LEFT ANTECUBITAL  Final   Special Requests BOTTLES DRAWN AEROBIC AND ANAEROBIC 10CC  Final   Culture NO GROWTH 5 DAYS  Final   Report Status 05/12/2015 FINAL  Final  Culture, blood (routine x 2)     Status: None   Collection Time: 05/07/15 12:40 AM  Result Value Ref Range Status   Specimen Description BLOOD RIGHT ANTECUBITAL  Final   Special Requests IN PEDIATRIC BOTTLE 2CC  Final   Culture NO GROWTH 5 DAYS  Final   Report Status 05/12/2015 FINAL  Final  Culture, respiratory (NON-Expectorated)     Status: None   Collection Time: 05/10/15  8:13 PM  Result Value Ref Range Status   Specimen Description TRACHEAL ASPIRATE  Final   Special Requests NONE  Final   Gram Stain   Final    MODERATE WBC PRESENT,BOTH PMN AND MONONUCLEAR NO SQUAMOUS EPITHELIAL CELLS SEEN NO ORGANISMS SEEN Performed at [facility]    Culture   Final    ABUNDANT SERRATIA MARCESCENS Performed at [facility]    Report Status 05/13/2015 FINAL  Final   Organism ID, Bacteria SERRATIA MARCESCENS  Final      Susceptibility   Serratia marcescens - MIC*    CEFAZOLIN >=64 RESISTANT Resistant     CEFEPIME <=1 SENSITIVE Sensitive     CEFTAZIDIME <=1 SENSITIVE Sensitive     CEFTRIAXONE <=1 SENSITIVE Sensitive     CIPROFLOXACIN <=0.25 SENSITIVE Sensitive     GENTAMICIN <=1 SENSITIVE Sensitive     TOBRAMYCIN 4 SENSITIVE Sensitive     TRIMETH/SULFA Value in next row Sensitive      <=20 SENSITIVE(NOTE)    * ABUNDANT SERRATIA MARCESCENS    Assessment: 64 yrs old wm 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 Afebrile, WBC WNL Day 3 of Vancomycin, Scr = 0.85  Patient has been receiving fortaz x 1 week then received vanc preop this morning. Renal function is normal, will have to make dose adjustments  based on weight.    Goal of Therapy:  Vancomycin trough level 10-15 mcg/ml  Plan:  Continue Vancomycin 1250 mg iv Q 12 hours Continue to follow renal function and progress  Thank you Anette Guarneri, PharmD [phone]  05/22/2015 11:56 AM

## 2015-05-22 NOTE — Progress Notes (Signed)
Waste 50 mL fentanyl in sink with Warden Fillers, RN.

## 2015-05-23 ENCOUNTER — Inpatient Hospital Stay (HOSPITAL_COMMUNITY): Payer: Medicare HMO

## 2015-05-23 ENCOUNTER — Encounter (HOSPITAL_COMMUNITY): Payer: Self-pay | Admitting: Thoracic Surgery (Cardiothoracic Vascular Surgery)

## 2015-05-23 LAB — TYPE AND SCREEN
ABO/RH(D): A NEG
Antibody Screen: POSITIVE
DAT, IgG: POSITIVE
DONOR AG TYPE: NEGATIVE
DONOR AG TYPE: NEGATIVE
DONOR AG TYPE: NEGATIVE
Donor AG Type: NEGATIVE
Donor AG Type: NEGATIVE
UNIT DIVISION: 0
UNIT DIVISION: 0
UNIT DIVISION: 0
UNIT DIVISION: 0
Unit division: 0

## 2015-05-23 LAB — CBC
HCT: 29.3 % — ABNORMAL LOW (ref 39.0–52.0)
HEMOGLOBIN: 9 g/dL — AB (ref 13.0–17.0)
MCH: 28.8 pg (ref 26.0–34.0)
MCHC: 30.7 g/dL (ref 30.0–36.0)
MCV: 93.6 fL (ref 78.0–100.0)
Platelets: 351 10*3/uL (ref 150–400)
RBC: 3.13 MIL/uL — AB (ref 4.22–5.81)
RDW: 15.2 % (ref 11.5–15.5)
WBC: 11.4 10*3/uL — AB (ref 4.0–10.5)

## 2015-05-23 LAB — GLUCOSE, CAPILLARY
GLUCOSE-CAPILLARY: 105 mg/dL — AB (ref 65–99)
GLUCOSE-CAPILLARY: 84 mg/dL (ref 65–99)
GLUCOSE-CAPILLARY: 96 mg/dL (ref 65–99)
Glucose-Capillary: 104 mg/dL — ABNORMAL HIGH (ref 65–99)
Glucose-Capillary: 96 mg/dL (ref 65–99)
Glucose-Capillary: 94 mg/dL (ref 65–99)

## 2015-05-23 LAB — BASIC METABOLIC PANEL
ANION GAP: 7 (ref 5–15)
BUN: 11 mg/dL (ref 6–20)
CHLORIDE: 105 mmol/L (ref 101–111)
CO2: 29 mmol/L (ref 22–32)
CREATININE: 0.79 mg/dL (ref 0.61–1.24)
Calcium: 8.3 mg/dL — ABNORMAL LOW (ref 8.9–10.3)
GFR calc non Af Amer: >60 mL/min (ref >60)
Glucose, Bld: 100 mg/dL — ABNORMAL HIGH (ref 65–99)
POTASSIUM: 3.7 mmol/L (ref 3.5–5.1)
SODIUM: 141 mmol/L (ref 135–145)

## 2015-05-23 LAB — VANCOMYCIN, TROUGH: Vancomycin Tr: 17 ug/mL (ref 10.0–20.0)

## 2015-05-23 MED ORDER — INSULIN ASPART 100 UNIT/ML ~~LOC~~ SOLN: 0.0000 [IU] | Freq: Three times a day (TID) | SUBCUTANEOUS | Status: DC

## 2015-05-23 MED ORDER — OXYCODONE HCL 5 MG PO TABS
10.0000 mg | ORAL_TABLET | ORAL | Status: DC | PRN
  Administered 2015-05-23: 30 mg via ORAL
  Filled 2015-05-23: qty 6

## 2015-05-23 MED ORDER — POTASSIUM CHLORIDE 10 MEQ/50ML IV SOLN
10.0000 meq | INTRAVENOUS | Status: AC
  Administered 2015-05-23 (×3): 10 meq via INTRAVENOUS
  Filled 2015-05-23 (×3): qty 50

## 2015-05-23 MED ORDER — FENTANYL CITRATE (PF) 100 MCG/2ML IJ SOLN
100.0000 ug | INTRAMUSCULAR | Status: DC | PRN
  Administered 2015-05-23: 250 ug via INTRAVENOUS
  Administered 2015-05-24 – 2015-05-25 (×4): 200 ug via INTRAVENOUS
  Administered 2015-05-25 (×2): 100 ug via INTRAVENOUS
  Administered 2015-05-26: 200 ug via INTRAVENOUS
  Filled 2015-05-23 (×3): qty 4
  Filled 2015-05-23: qty 6
  Filled 2015-05-23 (×2): qty 2
  Filled 2015-05-23 (×2): qty 4

## 2015-05-23 NOTE — Progress Notes (Signed)
3 Days Post-Op Procedure(s) (LRB): Right CHEST WALL RECONSTRUCTION (N/A) TISSUE ADVANCEMENT OF CHEST WALL WITH PLACEMENT OF FLEX HD FOR RECONSTRUCTION (N/A) Subjective: "I need water" Pain adequately controlled   Objective: Vital signs in last 24 hours: Temp:  [97.9 F (36.6 C)-98.8 F (37.1 C)] 98.4 F (36.9 C) (10/17 0731) Pulse Rate:  [58-88] 66 (10/17 0900) Cardiac Rhythm:  [-] Normal sinus rhythm (10/17 0800) Resp:  [13-26] 17 (10/17 0900) BP: (83-138)/(43-83) 130/47 mmHg (10/17 0900) SpO2:  [92 %-99 %] 95 % (10/17 0900) Arterial Line BP: (104-170)/(42-78) 139/55 mmHg (10/17 0900)  Hemodynamic parameters for last 24 hours:    Intake/Output from previous day: 10/16 0701 - 10/17 0700 In: [place] [I.V.:1305; NG/GT:30; IV Piggyback:500] Out: 3150 [Urine:2905; Drains:45; Chest Tube:200] Intake/Output this shift: Total I/O In: 400 [I.V.:100; IV Piggyback:300] Out: 310 [Urine:240; Drains:20; Chest Tube:50]  General appearance: alert, cooperative and no distress Neurologic: intact Heart: regular rate and rhythm Lungs: diminished breath sounds bibasilar Abdomen: normal findings: soft, non-tender  Lab Results:  Recent Labs  05/22/15 0400 05/23/15 0405  WBC 11.9* 11.4*  HGB 9.4* 9.0*  HCT 30.7* 29.3*  PLT 353 351   BMET:  Recent Labs  05/22/15 0400 05/23/15 0405  NA 145 141  K 3.9 3.7  CL 109 105  CO2 28 29  GLUCOSE 121* 100*  BUN 15 11  CREATININE 0.85 0.79  CALCIUM 8.4* 8.3*    PT/INR: No results for input(s): LABPROT, INR in the last 72 hours. ABG    Component Value Date/Time   PHART 7.367 05/22/2015 0953   HCO3 27.9* 05/22/2015 0953   TCO2 29 05/22/2015 0953   O2SAT 94.0 05/22/2015 0953   CBG (last 3)   Recent Labs  05/22/15 2353 05/23/15 0350 05/23/15 0728  GLUCAP 105* 84 104*    Assessment/Plan: S/P Procedure(s) (LRB): Right CHEST WALL RECONSTRUCTION (N/A) TISSUE ADVANCEMENT OF CHEST WALL WITH PLACEMENT OF FLEX HD FOR RECONSTRUCTION  (N/A) -  CV- stable  RESP- stable off vent   RENAL- creatinine and lytes oK  ENDO- CBG OK, change to AC/ HS  ? Aspiration yesterday after extubation. His voice is much better today. Will reassess swallowing adn advance diet if OK. If signs of aspiration will consult speech  On bedrest- mobilize when OK with Dr. Marla Roe   LOS: 18 days    [name] 05/23/2015

## 2015-05-23 NOTE — Care Management Important Message (Signed)
Important Message  Patient Details  Name: Jerry [name] MRN: [id number] Date of Birth: Apr 15, 1951   Medicare Important Message Given:  Yes-fourth notification given    Nathen May 05/23/2015, 11:45 AM

## 2015-05-23 NOTE — Progress Notes (Addendum)
[address]       [place] [address]             [phone]      EVENING ROUNDS NOTE :     [address]       [place] [address]             [phone]                 3 Days Post-Op Procedure(s) (LRB): Right CHEST WALL RECONSTRUCTION (N/A) TISSUE ADVANCEMENT OF CHEST WALL WITH PLACEMENT OF FLEX HD FOR RECONSTRUCTION (N/A)  Total Length of Stay:  LOS: 18 days  BP 123/51 mmHg  Pulse 66  Temp(Src) 97.9 F (36.6 C) (Oral)  Resp 21  Ht 6\' 2"  (1.88 m)  Wt 285 lb 0.9 oz (129.3 kg)  BMI 36.58 kg/m2  SpO2 100%  .Intake/Output      10/17 0701 - 10/18 0700   P.O. 300   I.V. (mL/kg) 550 (4.3)   NG/GT    IV Piggyback 600   Total Intake(mL/kg) 1450 (11.2)   Urine (mL/kg/hr) 1000 (0.5)   Drains 30 (0)   Chest Tube 130 (0.1)   Total Output 1160   Net +290         . 0.45 % NaCl with KCl 20 mEq / L 50 mL/hr at 05/23/15 1300     Lab Results  Component Value Date   WBC 11.4* 05/23/2015   HGB 9.0* 05/23/2015   HCT 29.3* 05/23/2015   PLT 351 05/23/2015   GLUCOSE 100* 05/23/2015   CHOL  09/01/2009    130        ATP III CLASSIFICATION:  <200     mg/dL   Desirable  200-239  mg/dL   Borderline High  >=240    mg/dL   High          TRIG 278* 05/21/2015   HDL 49 09/01/2009   LDLCALC  09/01/2009    63        Total Cholesterol/HDL:CHD Risk Coronary Heart Disease Risk Table                     Men   Women  1/2 Average Risk   3.4   3.3  Average Risk       5.0   4.4  2 X Average Risk   9.6   7.1  3 X Average Risk  23.4   11.0        Use the calculated Patient Ratio above and the CHD Risk Table to determine the patient's CHD Risk.        ATP III CLASSIFICATION (LDL):  <100     mg/dL   Optimal  100-129  mg/dL   Near or Above                    Optimal  130-159  mg/dL   Borderline  160-189  mg/dL   High  >190     mg/dL   Very High   ALT 17 05/14/2015   AST 24 05/14/2015   NA 141 05/23/2015   K 3.7 05/23/2015   CL 105  05/23/2015   CREATININE 0.79 05/23/2015   BUN 11 05/23/2015   CO2 29 05/23/2015   INR 1.20 05/19/2015    05/23/2015 9:18 PM  Remains quite stable   GOLD,WAYNE E, PA-C   Tolerating extubation I have seen and examined  [name] and agree with the above assessment  and plan.  [name] MD Beeper [phone] Office [phone] 05/23/2015 11:59 PM

## 2015-05-23 NOTE — Progress Notes (Signed)
Spoke with Dr. Marla Roe; per her ok to get pt OOB to chair at this time; plans to poss d/c wound vac on Wednesday; will cont. To monitor.  Jerry [name]

## 2015-05-23 NOTE — Progress Notes (Signed)
PHARMACY NOTE  Pharmacy Consult :  Vancomycin Indication : Chest Wound  Allergies  Allergen Reactions  . Ciprofloxacin Anaphylaxis  . Penicillins Rash  . Tramadol Hcl Rash    Dosing Weight : 129 kg  VITALS: Temp: 97.9 F (36.6 C) (10/17 1917) Temp Source: Oral (10/17 1917) BP: 118/53 mmHg (10/17 1900) Pulse Rate: 65 (10/17 1900)  LABS:  Recent Labs  05/21/15 0422 05/22/15 0400 05/23/15 0405  WBC 13.3* 11.9* 11.4*  HGB 10.5* 9.4* 9.0*  PLT 379 353 351  CREATININE 0.77 0.85 0.79    Recent Labs  05/23/15 1949  VANCOTROUGH 17     MICRO: Recent Results (from the past 720 hour(s))  Surgical pcr screen     Status: None   Collection Time: 05/02/15  2:11 PM  Result Value Ref Range Status   MRSA, PCR NEGATIVE NEGATIVE Final   Staphylococcus aureus NEGATIVE NEGATIVE Final    Comment:        The Xpert SA Assay (FDA approved for NASAL specimens in patients over 55 years of age), is one component of a comprehensive surveillance program.  Test performance has been validated by [facility] for patients greater than or equal to 24 year old. It is not intended to diagnose infection nor to guide or monitor treatment.   Culture, blood (routine x 2)     Status: None   Collection Time: 05/07/15 12:30 AM  Result Value Ref Range Status   Specimen Description BLOOD LEFT ANTECUBITAL  Final   Special Requests BOTTLES DRAWN AEROBIC AND ANAEROBIC 10CC  Final   Culture NO GROWTH 5 DAYS  Final   Report Status 05/12/2015 FINAL  Final  Culture, blood (routine x 2)     Status: None   Collection Time: 05/07/15 12:40 AM  Result Value Ref Range Status   Specimen Description BLOOD RIGHT ANTECUBITAL  Final   Special Requests IN PEDIATRIC BOTTLE 2CC  Final   Culture NO GROWTH 5 DAYS  Final   Report Status 05/12/2015 FINAL  Final  Culture, respiratory (NON-Expectorated)     Status: None   Collection Time: 05/10/15  8:13 PM  Result Value Ref Range Status   Specimen Description  TRACHEAL ASPIRATE  Final   Special Requests NONE  Final   Gram Stain   Final    MODERATE WBC PRESENT,BOTH PMN AND MONONUCLEAR NO SQUAMOUS EPITHELIAL CELLS SEEN NO ORGANISMS SEEN Performed at [facility]    Culture   Final    ABUNDANT SERRATIA MARCESCENS Performed at [facility]    Report Status 05/13/2015 FINAL  Final   Organism ID, Bacteria SERRATIA MARCESCENS  Final      Susceptibility   Serratia marcescens - MIC*    CEFAZOLIN >=64 RESISTANT Resistant     CEFEPIME <=1 SENSITIVE Sensitive     CEFTAZIDIME <=1 SENSITIVE Sensitive     CEFTRIAXONE <=1 SENSITIVE Sensitive     CIPROFLOXACIN <=0.25 SENSITIVE Sensitive     GENTAMICIN <=1 SENSITIVE Sensitive     TOBRAMYCIN 4 SENSITIVE Sensitive     TRIMETH/SULFA Value in next row Sensitive      <=20 SENSITIVE(NOTE)    * ABUNDANT SERRATIA MARCESCENS    ASSESSMENT:  64 yrs old maile 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  He has been receiving Vancomycin for chest wound with VAC in place.  Day # 4 of Vancomycin therapy.  Vancomycin levels drawn today.  Vancomycin trough 17 mcg/ml, within therapeutic window.  Renal  function stable with estimated CrCl > 110 ml/min.  GOAL:   Vancomycin Trough 15 - 20  mcg/ml  PLAN: 1. Continue Vancomycin 1250 mg IV q 12 hours. 2. Continue to monitor renal function, WBC, fever curve, any cultures/sensitivities, Follow-up Vanc levels as clinically indicated, length of therapy, and follow clinical progression.  Marthenia Rolling,  Pharm.D   05/23/2015,  8:23 PM

## 2015-05-24 LAB — CBC
HEMATOCRIT: 27.7 % — AB (ref 39.0–52.0)
Hemoglobin: 8.6 g/dL — ABNORMAL LOW (ref 13.0–17.0)
MCH: 28.8 pg (ref 26.0–34.0)
MCHC: 31 g/dL (ref 30.0–36.0)
MCV: 92.6 fL (ref 78.0–100.0)
PLATELETS: 328 10*3/uL (ref 150–400)
RBC: 2.99 MIL/uL — ABNORMAL LOW (ref 4.22–5.81)
RDW: 14.8 % (ref 11.5–15.5)
WBC: 8.3 10*3/uL (ref 4.0–10.5)

## 2015-05-24 LAB — BASIC METABOLIC PANEL
Anion gap: 6 (ref 5–15)
BUN: 9 mg/dL (ref 6–20)
CHLORIDE: 106 mmol/L (ref 101–111)
CO2: 29 mmol/L (ref 22–32)
Calcium: 8.3 mg/dL — ABNORMAL LOW (ref 8.9–10.3)
Creatinine, Ser: 0.85 mg/dL (ref 0.61–1.24)
GFR calc Af Amer: >60 mL/min (ref >60)
GFR calc non Af Amer: >60 mL/min (ref >60)
Glucose, Bld: 95 mg/dL (ref 65–99)
POTASSIUM: 3.6 mmol/L (ref 3.5–5.1)
SODIUM: 141 mmol/L (ref 135–145)

## 2015-05-24 MED ORDER — OXYCODONE HCL 5 MG/5ML PO SOLN
10.0000 mg | ORAL | Status: DC | PRN
  Administered 2015-05-24: 30 mg via ORAL
  Filled 2015-05-24 (×4): qty 30

## 2015-05-24 MED ORDER — MAGNESIUM HYDROXIDE 400 MG/5ML PO SUSP
30.0000 mL | Freq: Every day | ORAL | Status: DC | PRN
  Administered 2015-05-24 – 2015-05-26 (×2): 30 mL via ORAL
  Filled 2015-05-24 (×3): qty 30

## 2015-05-24 MED ORDER — LACTULOSE 10 GM/15ML PO SOLN
10.0000 g | Freq: Every day | ORAL | Status: DC | PRN
Start: 2015-05-24 — End: 2015-05-31
  Administered 2015-05-26: 10 g via ORAL
  Filled 2015-05-24 (×3): qty 15

## 2015-05-24 MED ORDER — POTASSIUM CHLORIDE 10 MEQ/50ML IV SOLN
10.0000 meq | INTRAVENOUS | Status: AC
  Administered 2015-05-24 (×3): 10 meq via INTRAVENOUS
  Filled 2015-05-24 (×2): qty 50

## 2015-05-24 MED ORDER — OXYCODONE HCL 20 MG/ML PO CONC
10.0000 mg | ORAL | Status: DC | PRN
  Administered 2015-05-24 – 2015-05-25 (×2): 30 mg via ORAL
  Filled 2015-05-24 (×2): qty 2

## 2015-05-24 MED ORDER — FLEET ENEMA 7-19 GM/118ML RE ENEM
1.0000 | ENEMA | Freq: Every day | RECTAL | Status: DC | PRN
  Filled 2015-05-24: qty 1

## 2015-05-24 MED ORDER — BISACODYL 10 MG RE SUPP
10.0000 mg | Freq: Every day | RECTAL | Status: DC | PRN
  Administered 2015-05-24 – 2015-05-28 (×3): 10 mg via RECTAL
  Filled 2015-05-24 (×3): qty 1

## 2015-05-24 NOTE — Progress Notes (Signed)
Inpatient Rehabilitation  We have received prescreen request for possible IP Rehab. Pt. Currently with limitations in activity tolerance.   Please consider ordering consult when pt. Is able to tolerate more activity in therapies.  Thanks you,  Gerlean Ren PT Inpatient Rehab Admissions Coordinator Cell [phone] Office [phone]

## 2015-05-24 NOTE — Progress Notes (Signed)
4 Days Post-Op Procedure(s) (LRB): Right CHEST WALL RECONSTRUCTION (N/A) TISSUE ADVANCEMENT OF CHEST WALL WITH PLACEMENT OF FLEX HD FOR RECONSTRUCTION (N/A) Subjective: Some incisional and back pain Having difficulty swallowing some pills  Objective: Vital signs in last 24 hours: Temp:  [97.9 F (36.6 C)-99.1 F (37.3 C)] 98.7 F (37.1 C) (10/18 0353) Pulse Rate:  [50-72] 54 (10/18 0700) Cardiac Rhythm:  [-] Sinus bradycardia (10/18 0400) Resp:  [15-26] 19 (10/18 0700) BP: (96-130)/(42-96) 111/61 mmHg (10/18 0700) SpO2:  [92 %-100 %] 98 % (10/18 0734) Arterial Line BP: (138-139)/(51-55) 139/55 mmHg (10/17 0900)  Hemodynamic parameters for last 24 hours:    Intake/Output from previous day: 10/17 0701 - 10/18 0700 In: [place] [P.O.:300; I.V.:1000; IV Piggyback:650] Out: 7544 [Urine:1660; Drains:43; Chest Tube:160] Intake/Output this shift:    General appearance: alert, cooperative and no distress Neurologic: intact Heart: regular rate and rhythm Lungs: faint rhonchi Wound: dressing intact  Lab Results:  Recent Labs  05/23/15 0405 05/24/15 0406  WBC 11.4* 8.3  HGB 9.0* 8.6*  HCT 29.3* 27.7*  PLT 351 328   BMET:  Recent Labs  05/23/15 0405 05/24/15 0406  NA 141 141  K 3.7 3.6  CL 105 106  CO2 29 29  GLUCOSE 100* 95  BUN 11 9  CREATININE 0.79 0.85  CALCIUM 8.3* 8.3*    PT/INR: No results for input(s): LABPROT, INR in the last 72 hours. ABG    Component Value Date/Time   PHART 7.367 05/22/2015 0953   HCO3 27.9* 05/22/2015 0953   TCO2 29 05/22/2015 0953   O2SAT 94.0 05/22/2015 0953   CBG (last 3)   Recent Labs  05/23/15 1153 05/23/15 1605 05/23/15 2145  GLUCAP 96 96 94    Assessment/Plan: S/P Procedure(s) (LRB): Right CHEST WALL RECONSTRUCTION (N/A) TISSUE ADVANCEMENT OF CHEST WALL WITH PLACEMENT OF FLEX HD FOR RECONSTRUCTION (N/A) -  Looks good CV- stable  RESP- doing well off vent, continue nebs, IS  RENAL- creatinine and lytes  Ok  ENDO- CBG normal- dc CBG  Enoxaparin and SCD for DVT prophylaxis  Deconditioning- PT to see, mobilize as tolerated     LOS: 19 days    [name] 05/24/2015

## 2015-05-24 NOTE — Care Management Note (Signed)
Case Management Note  Patient Details  Name: Jerry [name] MRN: [id number] Date of Birth: 1950-09-22  Subjective/Objective:   Pt lives with son who plans to take FMLA x 1 week to provide assistance when pt medically stable for discharge.  Pt states he also has 4 daughters that will help.  PT recommends CIR, pt will need OT eval for insurance purposes.    Pt states he once had a rehab stay @ [facility] and does not want to transfer to SNF, agrees to consider CIR.                           Expected Discharge Plan:  [place]  Discharge planning Services  CM Consult  Status of Service:  In process, will continue to follow  Medicare Important Message Given:  Yes-fourth notification given  Girard Cooter, RN 05/24/2015, 1:26 PM

## 2015-05-24 NOTE — Evaluation (Signed)
Physical Therapy Evaluation Patient Details Name: Jerry [name] MRN: [id number] DOB: 02-02-1951 Today's Date: 05/24/2015   History of Present Illness  Pt is a 64 y/o male who presents with a cartilaginous chest wall mass. Pt is now s/p a right anterior chest wall resection (3rd through 5th ribs), with reconstruction with polypropylene mesh and methyl methacrylate andwich on 05/06/15.   10/14 Plastics intervened, s/p Muscle and soft tissue advancement with placement of Flex HD 4 x 16 cm for closure of chest wall defect with Acell (500 mg) and VAC placement  Clinical Impression  Pt admitted with/for management of chest wall mass that has needed reconstruction x2 with pt now having been immobile for approx 3 weeks and is now significantly deconditioned and weak..  Pt currently limited functionally due to the problems listed. ( See problems list.)   Pt will benefit from PT to maximize function and safety in order to get ready for next venue listed below.     Follow Up Recommendations CIR    Equipment Recommendations  Rolling walker with 5" wheels;3in1 (PT)    Recommendations for Other Services       Precautions / Restrictions Precautions Precautions: Fall Precaution Comments: 2 drains and chest tube Required Braces or Orthoses: Other Brace/Splint (abdominal and chest binders)      Mobility  Bed Mobility Overal bed mobility: Needs Assistance Bed Mobility: Supine to Sit     Supine to sit: Mod assist;+2 for safety/equipment     General bed mobility comments: Up via R UE  Truncal assist and pt pulling up with L UE  Transfers Overall transfer level: Needs assistance Equipment used: Rolling walker (2 wheeled) Transfers: Sit to/from [facility] Sit to Stand: Mod assist;+2 physical assistance Stand pivot transfers: Mod assist;+2 physical assistance       General transfer comment: pt had difficulty attaining full upright stance mostly due to L LE weakness, but  also truncal weakness  Ambulation/Gait                Stairs            Wheelchair Mobility    Modified Rankin (Stroke Patients Only)       Balance Overall balance assessment: Needs assistance Sitting-balance support: No upper extremity supported Sitting balance-Leahy Scale: Fair     Standing balance support: Bilateral upper extremity supported Standing balance-Leahy Scale: Poor Standing balance comment: heavy reliance on bari RW.   Stood x4 working on standing tolerance.  PT needed LE set up before standing due to tendency toward LE adduction and ER                             Pertinent Vitals/Pain Pain Assessment: Faces Pain Score: 1  Faces Pain Scale: Hurts little more Pain Location: R chest wall Pain Descriptors / Indicators: Grimacing Pain Intervention(s): Monitored during session;Repositioned    Home Living Family/patient expects to be discharged to:: Private residence Living Arrangements: Children (son and 3 dogs) Available Help at Discharge: Family;Available 24 hours/day Type of Home: House Home Access: Stairs to enter   [employer] of Steps: 1 small step (threshold to front door) Home Layout: One level Home Equipment: Cane - single point      Prior Function Level of Independence: Independent with assistive device(s)               Hand Dominance   Dominant Hand: Right    Extremity/Trunk Assessment  Lower Extremity Assessment: Generalized weakness;RLE deficits/detail;LLE deficits/detail RLE Deficits / Details: grossly 4/5, Hip ER tight hindering IR LLE Deficits / Details: gross weakness at 3 to 3+/5 and tightness in hip ER  Cervical / Trunk Assessment: Normal  Communication   Communication: No difficulties  Cognition Arousal/Alertness: Awake/alert Behavior During Therapy: WFL for tasks assessed/performed Overall Cognitive Status: Within Functional Limits for tasks assessed                       General Comments General comments (skin integrity, edema, etc.): VSS,  Pt fearful of falling at this point.    Exercises        Assessment/Plan    PT Assessment Patient needs continued PT services  PT Diagnosis Difficulty walking;Abnormality of gait;Generalized weakness   PT Problem List Decreased strength;Decreased range of motion;Decreased activity tolerance;Decreased balance;Decreased mobility;Decreased knowledge of use of DME;Cardiopulmonary status limiting activity;Obesity  PT Treatment Interventions DME instruction;Gait training;Stair training;Functional mobility training;Therapeutic activities;Patient/family education;Therapeutic exercise   PT Goals (Current goals can be found in the Care Plan section) Acute Rehab PT Goals Patient Stated Goal: Back home PT Goal Formulation: With patient/family Time For Goal Achievement: 06/07/15 Potential to Achieve Goals: Good    Frequency Min 3X/week   Barriers to discharge        Co-evaluation               End of Session Equipment Utilized During Treatment: Oxygen Activity Tolerance: Patient tolerated treatment well;Patient limited by fatigue Patient left: in chair;with call bell/phone within reach;with nursing/sitter in room;Other (comment) (on sky lift pad) Nurse Communication: Mobility status         Time: 1123-1202 PT Time Calculation (min) (ACUTE ONLY): 39 min   Charges:   PT Evaluation $Initial PT Evaluation Tier I: 1 Procedure PT Treatments $Therapeutic Activity: 23-37 mins   PT G Codes:        Ramie Erman, Tessie Fass 05/24/2015, 12:41 PM  05/24/2015  Donnella Sham, [place] [phone]  (pager)

## 2015-05-24 NOTE — Progress Notes (Signed)
Physical Therapy Treatment Patient Details Name: Jerry [name] MRN: [id number] DOB: 1951-07-03 Today's Date: 05/24/2015    History of Present Illness Pt is a 64 y/o male who presents with a cartilaginous chest wall mass. Pt is now s/p a right anterior chest wall resection (3rd through 5th ribs), with reconstruction with polypropylene mesh and methyl methacrylate andwich on 05/06/15.   10/14 Plastics intervened, s/p Muscle and soft tissue advancement with placement of Flex HD 4 x 16 cm for closure of chest wall defect with Acell (500 mg) and VAC placement    PT Comments    Stood from chair better than earlier session and transferred back to bed better than to the chair earlier.  Follow Up Recommendations  CIR     Equipment Recommendations  Rolling walker with 5" wheels;3in1 (PT)    Recommendations for Other Services       Precautions / Restrictions Precautions Precautions: Fall Precaution Comments: 2 drains and chest tube Required Braces or Orthoses: Other Brace/Splint (abdominal and chest binders)    Mobility  Bed Mobility Overal bed mobility: Needs Assistance Bed Mobility: Sit to Supine     Supine to sit: Mod assist;+2 for safety/equipment Sit to supine: Mod assist;+2 for physical assistance   General bed mobility comments: Up via R UE  Truncal assist and pt pulling up with L UE  Transfers Overall transfer level: Needs assistance Equipment used: Rolling walker (2 wheeled) Transfers: Sit to/from [facility] Sit to Stand: Mod assist;+2 safety/equipment Stand pivot transfers: Mod assist;+2 physical assistance       General transfer comment: guarded R knee as pt pivoted back to bed.  On 2 occasions, pt's knees buckled, but easily controlled the transfer by steady assist and support at knee  Ambulation/Gait                 Stairs            Wheelchair Mobility    Modified Rankin (Stroke Patients Only)       Balance Overall  balance assessment: Needs assistance Sitting-balance support: No upper extremity supported Sitting balance-Leahy Scale: Fair     Standing balance support: Bilateral upper extremity supported Standing balance-Leahy Scale: Poor Standing balance comment: heavy reliance on the RW                    Cognition Arousal/Alertness: Awake/alert Behavior During Therapy: WFL for tasks assessed/performed Overall Cognitive Status: Within Functional Limits for tasks assessed                      Exercises      General Comments General comments (skin integrity, edema, etc.): VSS,  Pt fearful of falling at this point.      Pertinent Vitals/Pain Pain Assessment: Faces Pain Score: 1  Faces Pain Scale: Hurts little more Pain Location: R chest wall Pain Descriptors / Indicators: Grimacing Pain Intervention(s): Monitored during session;Repositioned    Home Living Family/patient expects to be discharged to:: Private residence Living Arrangements: Children (son and 3 dogs) Available Help at Discharge: Family;Available 24 hours/day Type of Home: House Home Access: Stairs to enter   Home Layout: One level Home Equipment: Kasandra Knudsen - single point      Prior Function Level of Independence: Independent with assistive device(s)          PT Goals (current goals can now be found in the care plan section) Acute Rehab PT Goals Patient Stated Goal: Back home PT Goal Formulation:  With patient/family Time For Goal Achievement: 06/07/15 Potential to Achieve Goals: Good Progress towards PT goals: Progressing toward goals    Frequency  Min 3X/week    PT Plan Current plan remains appropriate    Co-evaluation             End of Session Equipment Utilized During Treatment: Oxygen Activity Tolerance: Patient tolerated treatment well;Patient limited by fatigue Patient left: in bed;with call bell/phone within reach;with nursing/sitter in room     Time: 1348-1401 PT Time  Calculation (min) (ACUTE ONLY): 13 min  Charges:  $Therapeutic Activity: 8-22 mins                    G Codes:      Dajahnae Vondra, Tessie Fass 05/24/2015, 3:14 PM 05/24/2015  Donnella Sham, PT [phone]  (pager)

## 2015-05-24 NOTE — Progress Notes (Signed)
Pharmacy Progress Note  Patient on indwelling pain pump that gives him 12 mg of morphine, 24 micrograms of baclofen and 6 mg of bupivacaine per day.   I spoke with both the patient and his medication supplier ([employer] [facility], [phone]) again today 10/18. Per supplier the pump is due for refilling on 10/22 but will provide a refill alarm on 10/21.   Discussed situation with Rn [place] at [facility] that patient will likely be in hospital at least a few more days and plans will need to be made for in-hospital refill. She is checking with her physician supervisor and calling me back when more information is known.   I will update EMR/patient when I hear from them.   Erin Hearing PharmD., BCPS Clinical Pharmacist [phone]  05/24/2015 3:17 PM

## 2015-05-24 NOTE — Progress Notes (Signed)
TCTS BRIEF SICU PROGRESS NOTE  4 Days Post-Op  S/P Procedure(s) (LRB): Right CHEST WALL RECONSTRUCTION (N/A) TISSUE ADVANCEMENT OF CHEST WALL WITH PLACEMENT OF FLEX HD FOR RECONSTRUCTION (N/A)   Stable day Out of bed w/ PT earlier NSR w/ stable BP O2 sats 95% on 1.5 L/min UOP adequate  Plan: Continue current plan  Rexene Alberts, MD 05/24/2015 5:29 PM

## 2015-05-25 ENCOUNTER — Inpatient Hospital Stay (HOSPITAL_COMMUNITY): Payer: Medicare HMO

## 2015-05-25 LAB — GLUCOSE, CAPILLARY: Glucose-Capillary: 93 mg/dL (ref 65–99)

## 2015-05-25 MED ORDER — ALBUTEROL SULFATE (2.5 MG/3ML) 0.083% IN NEBU: 2.5000 mg | INHALATION_SOLUTION | RESPIRATORY_TRACT | Status: DC | PRN

## 2015-05-25 MED ORDER — OXYCODONE HCL 5 MG PO TABS
15.0000 mg | ORAL_TABLET | ORAL | Status: DC | PRN
Start: 2015-05-25 — End: 2015-05-31
  Administered 2015-05-25 (×2): 30 mg via ORAL
  Administered 2015-05-26 (×2): 15 mg via ORAL
  Administered 2015-05-26: 20 mg via ORAL
  Filled 2015-05-25 (×4): qty 6
  Filled 2015-05-25 (×2): qty 3

## 2015-05-25 MED ORDER — PRO-STAT SUGAR FREE PO LIQD
30.0000 mL | Freq: Two times a day (BID) | ORAL | Status: DC
  Administered 2015-05-26 – 2015-05-29 (×5): 30 mL via ORAL
  Filled 2015-05-25 (×14): qty 30

## 2015-05-25 MED ORDER — FUROSEMIDE 40 MG PO TABS
40.0000 mg | ORAL_TABLET | Freq: Every day | ORAL | Status: DC
  Administered 2015-05-26 – 2015-05-31 (×6): 40 mg via ORAL
  Filled 2015-05-25 (×8): qty 1

## 2015-05-25 MED ORDER — ENSURE ENLIVE PO LIQD
237.0000 mL | Freq: Two times a day (BID) | ORAL | Status: DC
  Administered 2015-05-26 – 2015-05-30 (×3): 237 mL via ORAL

## 2015-05-25 NOTE — Progress Notes (Addendum)
Patient due to void, bladder scan shows 483, MD notified and states to wait a few more hours if unable to void place foley, right hand swollen compared to left hand-patient states his right hand/arm feels weak/numb, pulses palpable, MD notified, MD came to room to assess, orders to have patient keep arm elevated.  Rowe Pavy, RN

## 2015-05-25 NOTE — Progress Notes (Addendum)
Nutrition Follow-up  DOCUMENTATION CODES:   Morbid obesity  INTERVENTION:   Ensure Enlive po BID, each supplement provides 350 kcal and 20 grams of protein  Prostat liquid protein po 30 ml BID with meals, each supplement provides 100 kcal, 15 grams protein  NUTRITION DIAGNOSIS:   Increased nutrient needs now related to  (post op healing) as evidenced by estimated needs, ongoing  GOAL:   Patient will meet greater than or equal to 90% of their needs, progressing  MONITOR:   PO intake, Supplement acceptance, Labs, Weight trends, I & O's  ASSESSMENT:   64 yo Male who has chronic pain due to multiple orthopedic injuries and surgeries in the past. His history is also significant for tobacco abuse, hypertension, arthritis, allergies and CAD. 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  Patient s/p procedures 10/8: CHEST WALL WOUND VAC CHANGE REMOVAL OF CHEST TUBE  Patient s/p procedures 10/14: RIGHT CHEST WALL RECONSTRUCTION  TISSUE ADVANCEMENT OF CHEST WALL WITH PLACEMENT OF FLEX HD FOR RECONSTRUCTION   Patient extubated 10/16.  Diet advanced to Clear Liquids 10/17, Heart Heathy 10/18.  PO intake suboptimal at 25% per flowsheet records.  Would benefit from addition of oral nutrition supplements.  RD to order to maximize PO intake.  Diet Order:  Diet Heart Room service appropriate?: Yes; Fluid consistency:: Thin  Skin:  Wound (see comment) (Incision, Right Chest)  Last BM:  10/18  Height:   Ht Readings from Last 1 Encounters:  05/20/15 6\' 2"  (1.88 m)    Weight:   Wt Readings from Last 1 Encounters:  05/22/15 285 lb 0.9 oz (129.3 kg)    Ideal Body Weight:  81 kg  BMI:  Body mass index is 36.58 kg/(m^2).  Estimated Nutritional Needs:   Kcal:  2111-5520  Protein:  130-140 gm  Fluid:  per MD  EDUCATION NEEDS:   No education needs identified at this time  Arthur Holms, RD, LDN Pager #: [phone] After-Hours Pager  #: [phone]

## 2015-05-25 NOTE — Progress Notes (Signed)
Pharmacy Progress Note  Patient on indwelling pain pump that gives him 12 mg of morphine, 24 micrograms of baclofen and 6 mg of bupivacaine per day.   I spoke with both the patient and his medication supplier ([employer] [facility], [phone]) again today 10/18. Per supplier the pump is due for refilling on 10/22 but will provide a refill alarm on 10/21.   Discussed situation with Rn [place] at [facility] that patient will likely be in hospital at least a few more days and plans will need to be made for in-hospital refill. She is checking with her physician supervisor and calling me back when more information is known.   10/19: received script for pain pump refill by Dr.Hendrickson, will send to specialty pharmacy today for delivery/refill 10/21. We discussed that a MD or extender will have to do the physical refill. Have been in contact with Luisa Dago with [employer] [phone]) who will be available to help facilitate refill.  Will need to follow up delivery of medication and scheduling of refill with MD/extender and [employer] representative.  Erin Hearing PharmD., BCPS Clinical Pharmacist [phone]  05/25/2015 9:28 AM

## 2015-05-25 NOTE — Progress Notes (Signed)
DC foley and right CT per MD order, patient tolerated well, call bell within reach, will continue to monitor.  Rowe Pavy, RN

## 2015-05-25 NOTE — Progress Notes (Signed)
5 Days Post-Op Procedure(s) (LRB): Right CHEST WALL RECONSTRUCTION (N/A) TISSUE ADVANCEMENT OF CHEST WALL WITH PLACEMENT OF FLEX HD FOR RECONSTRUCTION (N/A) Subjective: Still having some trouble taking pills but no issues with eating  Objective: Vital signs in last 24 hours: Temp:  [98.4 F (36.9 C)-99.6 F (37.6 C)] 99 F (37.2 C) (10/19 0740) Pulse Rate:  [55-88] 55 (10/19 0700) Cardiac Rhythm:  [-] Normal sinus rhythm;Sinus bradycardia (10/19 0714) Resp:  [13-28] 17 (10/19 0700) BP: (105-138)/(40-96) 116/61 mmHg (10/19 0700) SpO2:  [92 %-100 %] 100 % (10/19 0758)  Hemodynamic parameters for last 24 hours:    Intake/Output from previous day: 10/18 0701 - 10/19 0700 In: 2382 [P.O.:582; I.V.:1200; IV Piggyback:600] Out: 2100 [Urine:1900; Drains:20; Chest Tube:180] Intake/Output this shift:    General appearance: alert, cooperative and no distress Neurologic: intact Heart: regular rate and rhythm Lungs: diminished breath sounds bibasilar Abdomen: normal findings: soft nontender, dressings in place  Lab Results:  Recent Labs  05/23/15 0405 05/24/15 0406  WBC 11.4* 8.3  HGB 9.0* 8.6*  HCT 29.3* 27.7*  PLT 351 328   BMET:  Recent Labs  05/23/15 0405 05/24/15 0406  NA 141 141  K 3.7 3.6  CL 105 106  CO2 29 29  GLUCOSE 100* 95  BUN 11 9  CREATININE 0.79 0.85  CALCIUM 8.3* 8.3*    PT/INR: No results for input(s): LABPROT, INR in the last 72 hours. ABG    Component Value Date/Time   PHART 7.367 05/22/2015 0953   HCO3 27.9* 05/22/2015 0953   TCO2 29 05/22/2015 0953   O2SAT 94.0 05/22/2015 0953   CBG (last 3)   Recent Labs  05/23/15 1153 05/23/15 1605 05/23/15 2145  GLUCAP 96 96 94    Assessment/Plan: S/P Procedure(s) (LRB): Right CHEST WALL RECONSTRUCTION (N/A) TISSUE ADVANCEMENT OF CHEST WALL WITH PLACEMENT OF FLEX HD FOR RECONSTRUCTION (N/A) -  CV- stable  RESP- no issues , lungs clear anteriorly, diminished in bases  RENAL- creatinine  and lytes OK  Dc Foley  ENDO- CBG well controlled  Nutrition- taking regular diet  Continue to mobilize  ID- no evidence of infection- stop Vanco   LOS: 20 days    [name] 05/25/2015

## 2015-05-25 NOTE — Progress Notes (Signed)
Physical Therapy Treatment Patient Details Name: Jerry [name] MRN: [id number] DOB: 09-Mar-1951 Today's Date: 05/25/2015    History of Present Illness Pt is a 64 y/o male who presents with a cartilaginous chest wall mass. Pt is now s/p a right anterior chest wall resection (3rd through 5th ribs), with reconstruction with polypropylene mesh and methyl methacrylate andwich on 05/06/15.   10/14 Plastics intervened, s/p Muscle and soft tissue advancement with placement of Flex HD 4 x 16 cm for closure of chest wall defect with Acell (500 mg) and VAC placement    PT Comments    Progressing slowly.  Pt's able to maintain stance and ambulate without L knee buckling, but noticeable fatigue with each step.  Follow Up Recommendations  CIR     Equipment Recommendations  Rolling walker with 5" wheels;3in1 (PT)    Recommendations for Other Services       Precautions / Restrictions Precautions Precautions: Fall Precaution Comments: 2 drains and VAC Required Braces or Orthoses: Other Brace/Splint (abd/chest binder)    Mobility  Bed Mobility Overal bed mobility: Needs Assistance Bed Mobility: Sit to Supine     Supine to sit: Mod assist        Transfers Overall transfer level: Needs assistance   Transfers: Sit to/from Stand Sit to Stand: Min assist;+2 physical assistance;Mod assist (more as pt fatigued)         General transfer comment: assist to come forward more than lift; cues for hand placement  Ambulation/Gait Ambulation/Gait assistance: Mod assist;+2 physical assistance Ambulation Distance (Feet): 8 Feet Assistive device: Rolling walker (2 wheeled) Gait Pattern/deviations: Step-through pattern;Decreased step length - right;Decreased step length - left;Decreased stance time - left;Decreased stride length;Shuffle Gait velocity: Decreased Gait velocity interpretation: Below normal speed for age/gender General Gait Details: small amplitude steps with pt staying minimally  on the L LE due to weakness   Stairs            Wheelchair Mobility    Modified Rankin (Stroke Patients Only)       Balance Overall balance assessment: Needs assistance Sitting-balance support: No upper extremity supported Sitting balance-Leahy Scale: Fair     Standing balance support: Bilateral upper extremity supported Standing balance-Leahy Scale: Poor Standing balance comment: reliant on the RW                    Cognition Arousal/Alertness: Awake/alert Behavior During Therapy: WFL for tasks assessed/performed Overall Cognitive Status: Within Functional Limits for tasks assessed (but mildly slow of processing)                      Exercises      General Comments General comments (skin integrity, edema, etc.): VSS through out      Pertinent Vitals/Pain Pain Assessment: Faces Faces Pain Scale: Hurts little more Pain Location: chest Pain Descriptors / Indicators: Grimacing Pain Intervention(s): Monitored during session    Home Living                      Prior Function            PT Goals (current goals can now be found in the care plan section) Acute Rehab PT Goals Patient Stated Goal: Back home PT Goal Formulation: With patient/family Time For Goal Achievement: 06/07/15 Potential to Achieve Goals: Good Progress towards PT goals: Progressing toward goals    Frequency  Min 3X/week    PT Plan Current plan remains appropriate  Co-evaluation             End of Session Equipment Utilized During Treatment: Oxygen Activity Tolerance: Patient tolerated treatment well;Patient limited by fatigue Patient left: in chair;with call bell/phone within reach;with family/visitor present     Time: 1202-1231 PT Time Calculation (min) (ACUTE ONLY): 29 min  Charges:  $Gait Training: 8-22 mins $Therapeutic Activity: 8-22 mins                    G Codes:      Naitik Hermann, Tessie Fass 05/25/2015, 1:49 PM 05/25/2015  Donnella Sham, PT [phone]  (pager)

## 2015-05-26 DIAGNOSIS — D62 Acute posthemorrhagic anemia: Secondary | ICD-10-CM

## 2015-05-26 DIAGNOSIS — R5381 Other malaise: Secondary | ICD-10-CM

## 2015-05-26 DIAGNOSIS — F411 Generalized anxiety disorder: Secondary | ICD-10-CM

## 2015-05-26 LAB — CBC
HEMATOCRIT: 29.1 % — AB (ref 39.0–52.0)
Hemoglobin: 9 g/dL — ABNORMAL LOW (ref 13.0–17.0)
MCH: 28.6 pg (ref 26.0–34.0)
MCHC: 30.9 g/dL (ref 30.0–36.0)
MCV: 92.4 fL (ref 78.0–100.0)
PLATELETS: 353 10*3/uL (ref 150–400)
RBC: 3.15 MIL/uL — ABNORMAL LOW (ref 4.22–5.81)
RDW: 14.5 % (ref 11.5–15.5)
WBC: 7.3 10*3/uL (ref 4.0–10.5)

## 2015-05-26 LAB — BASIC METABOLIC PANEL
Anion gap: 6 (ref 5–15)
BUN: <5 mg/dL — ABNORMAL LOW (ref 6–20)
CHLORIDE: 103 mmol/L (ref 101–111)
CO2: 30 mmol/L (ref 22–32)
CREATININE: 0.68 mg/dL (ref 0.61–1.24)
Calcium: 8.2 mg/dL — ABNORMAL LOW (ref 8.9–10.3)
GFR calc non Af Amer: >60 mL/min (ref >60)
GLUCOSE: 104 mg/dL — AB (ref 65–99)
Potassium: 3.4 mmol/L — ABNORMAL LOW (ref 3.5–5.1)
Sodium: 139 mmol/L (ref 135–145)

## 2015-05-26 MED ORDER — POTASSIUM CHLORIDE 20 MEQ/15ML (10%) PO SOLN
40.0000 meq | Freq: Every day | ORAL | Status: DC
  Administered 2015-05-26 – 2015-05-28 (×3): 40 meq via ORAL
  Filled 2015-05-26 (×3): qty 30

## 2015-05-26 MED ORDER — PANTOPRAZOLE SODIUM 40 MG PO TBEC
40.0000 mg | DELAYED_RELEASE_TABLET | Freq: Two times a day (BID) | ORAL | Status: DC
  Administered 2015-05-26 – 2015-05-31 (×10): 40 mg via ORAL
  Filled 2015-05-26 (×11): qty 1

## 2015-05-26 MED ORDER — POTASSIUM CHLORIDE 10 MEQ/50ML IV SOLN
10.0000 meq | INTRAVENOUS | Status: AC
  Administered 2015-05-26 (×3): 10 meq via INTRAVENOUS
  Filled 2015-05-26 (×3): qty 50

## 2015-05-26 NOTE — Progress Notes (Signed)
6 Days Post-Op Procedure(s) (LRB): Right CHEST WALL RECONSTRUCTION (N/A) TISSUE ADVANCEMENT OF CHEST WALL WITH PLACEMENT OF FLEX HD FOR RECONSTRUCTION (N/A) Subjective: "I'm getting better" Pain better controlled this AM  Objective: Vital signs in last 24 hours: Temp:  [97.7 F (36.5 C)-99 F (37.2 C)] 99 F (37.2 C) (10/20 0336) Pulse Rate:  [55-74] 60 (10/20 0800) Cardiac Rhythm:  [-] Normal sinus rhythm (10/20 0800) Resp:  [14-23] 18 (10/20 0800) BP: (101-141)/(41-73) 130/52 mmHg (10/20 0700) SpO2:  [93 %-100 %] 94 % (10/20 0800)  Hemodynamic parameters for last 24 hours:    Intake/Output from previous day: 10/19 0701 - 10/20 0700 In: 1085.3 [P.O.:160; I.V.:675.3; IV Piggyback:250] Out: 2230 [Urine:2200; Drains:30] Intake/Output this shift: Total I/O In: 60 [I.V.:10; IV Piggyback:50] Out: 275 [Urine:275]  General appearance: alert and cooperative Neurologic: intact Heart: regular rate and rhythm Lungs: diminished breath sounds bibasilar Abdomen: soft nontender Wound: wounds clean  Lab Results:  Recent Labs  05/24/15 0406 05/26/15 0350  WBC 8.3 7.3  HGB 8.6* 9.0*  HCT 27.7* 29.1*  PLT 328 353   BMET:  Recent Labs  05/24/15 0406 05/26/15 0350  NA 141 139  K 3.6 3.4*  CL 106 103  CO2 29 30  GLUCOSE 95 104*  BUN 9 <5*  CREATININE 0.85 0.68  CALCIUM 8.3* 8.2*    PT/INR: No results for input(s): LABPROT, INR in the last 72 hours. ABG    Component Value Date/Time   PHART 7.367 05/22/2015 0953   HCO3 27.9* 05/22/2015 0953   TCO2 29 05/22/2015 0953   O2SAT 94.0 05/22/2015 0953   CBG (last 3)   Recent Labs  05/23/15 1605 05/23/15 2145 05/25/15 1927  GLUCAP 96 94 93    Assessment/Plan: S/P Procedure(s) (LRB): Right CHEST WALL RECONSTRUCTION (N/A) TISSUE ADVANCEMENT OF CHEST WALL WITH PLACEMENT OF FLEX HD FOR RECONSTRUCTION (N/A) -  CV- stable  RESP- previously treated for serratia pneumonia- has been off antibiotics for that for over a  week.  Currently stable  RENAL - hypokalemia, suppplement  ENDO- CBG well controlled  Wounds healing well, CT out, drains to remain in place, VAC dc'ed  Deconditioning- may be a candidate for inpatient rehab     LOS: 21 days    [name] 05/26/2015

## 2015-05-26 NOTE — Progress Notes (Signed)
Patient ID: Jerry [name], male   DOB: Jan 07, 1951, 64 y.o.   MRN: [id number]  SICU Evening Rounds:  Hemodynamically stable, sinus 58  sats 94%  Urine output good.  No problems today.

## 2015-05-26 NOTE — Progress Notes (Signed)
Pharmacy Progress Note  Patient on indwelling pain pump that gives him 12 mg of morphine, 24 micrograms of baclofen and 6 mg of bupivacaine per day.   I spoke with both the patient and his medication supplier ([employer] [facility], [phone]) again today 10/18. Per supplier the pump is due for refilling on 10/22 but will provide a refill alarm on 10/21.   Discussed situation with Rn [place] at [facility] that patient will likely be in hospital at least a few more days and plans will need to be made for in-hospital refill. She is checking with her physician supervisor and calling me back when more information is known.   10/19: received script for pain pump refill by Dr.Hendrickson, will send to specialty pharmacy today for delivery/refill 10/21. We discussed that a MD or extender will have to do the physical refill. Have been in contact with Luisa Dago with [employer] [phone]) who will be available to help facilitate refill.  Will need to follow up delivery of medication and scheduling of refill with MD/extender and [employer] representative.  10/20:Medication to be delivered 10/21 for refill. Given schedules will tentatively plan on refill to be done tomorrow afternoon ~130-2. Will need to clarify with surgery/extenders in am for any changes.  Erin Hearing PharmD., BCPS Clinical Pharmacist [phone]  05/26/2015 2:55 PM

## 2015-05-26 NOTE — Progress Notes (Signed)
6 Days Post-Op  Subjective: Patient is 6 days post op from closure of chest wall defect.  He was seen yesterday sitting in chair.  Today he was seen in the bed taking a nap.  The incisions are intact.  Drainage as expected.  VAC off.    Objective: Vital signs in last 24 hours: Temp:  [97.7 F (36.5 C)-99.5 F (37.5 C)] 99.5 F (37.5 C) (10/20 1545) Pulse Rate:  [55-83] 76 (10/20 1500) Resp:  [15-29] 22 (10/20 1500) BP: (69-142)/(41-67) 138/47 mmHg (10/20 1500) SpO2:  [91 %-100 %] 93 % (10/20 1500) Last BM Date: 05/24/15  Intake/Output from previous day: 10/19 0701 - 10/20 0700 In: 1085.3 [P.O.:160; I.V.:675.3; IV Piggyback:250] Out: 2230 [Urine:2200; Drains:30] Intake/Output this shift: Total I/O In: 530 [P.O.:300; I.V.:80; IV Piggyback:150] Out: 2730 [Urine:2635; Drains:95]  General appearance: alert, cooperative and no distress Incision/Wound:  Lab Results:   Recent Labs  05/24/15 0406 05/26/15 0350  WBC 8.3 7.3  HGB 8.6* 9.0*  HCT 27.7* 29.1*  PLT 328 353   BMET  Recent Labs  05/24/15 0406 05/26/15 0350  NA 141 139  K 3.6 3.4*  CL 106 103  CO2 29 30  GLUCOSE 95 104*  BUN 9 <5*  CREATININE 0.85 0.68  CALCIUM 8.3* 8.2*   PT/INR No results for input(s): LABPROT, INR in the last 72 hours. ABG No results for input(s): PHART, HCO3 in the last 72 hours.  Invalid input(s): PCO2, PO2  Studies/Results: Dg Chest Port 1 View  05/25/2015  CLINICAL DATA:  Chest wall mass EXAM: PORTABLE CHEST 1 VIEW COMPARISON:  Two days ago FINDINGS: Unchanged positioning of drains around the right chest. Hazy bibasilar opacity with volume loss greater on the right is unchanged, likely atelectasis and pleural effusions. Right IJ central line, tip at the SVC level. Stable heart size and mediastinal contours. No pneumothorax. IMPRESSION: Bibasilar atelectasis and pleural effusions without change over multiple days. Electronically Signed   By: Monte Fantasia M.D.   On: 05/25/2015  07:57    Anti-infectives: Anti-infectives    Start     Dose/Rate Route Frequency Ordered Stop   05/20/15 2000  vancomycin (VANCOCIN) 1,250 mg in sodium chloride 0.9 % 250 mL IVPB  Status:  Discontinued     1,250 mg 166.7 mL/hr over 90 Minutes Intravenous Every 12 hours 05/20/15 1638 05/25/15 0859   05/20/15 1253  polymyxin B 500,000 Units, bacitracin 50,000 Units in sodium chloride irrigation 0.9 % 500 mL irrigation  Status:  Discontinued       As needed 05/20/15 1253 05/20/15 1542   05/20/15 1045  vancomycin (VANCOCIN) IVPB 1000 mg/200 mL premix  Status:  Discontinued     1,000 mg 200 mL/hr over 60 Minutes Intravenous Every 12 hours 05/20/15 1032 05/20/15 1553   05/12/15 2200  cefTRIAXone (ROCEPHIN) 1 g in dextrose 5 % 50 mL IVPB  Status:  Discontinued     1 g 100 mL/hr over 30 Minutes Intravenous Every 12 hours 05/12/15 1709 05/12/15 1812   05/12/15 1830  cefTAZidime (FORTAZ) 2 g in dextrose 5 % 50 mL IVPB     2 g 100 mL/hr over 30 Minutes Intravenous 3 times per day 05/12/15 1812 05/19/15 2139   05/11/15 2300  vancomycin (VANCOCIN) IVPB 1000 mg/200 mL premix     1,000 mg 200 mL/hr over 60 Minutes Intravenous  Once 05/11/15 1309 05/12/15 0000   05/09/15 0800  vancomycin (VANCOCIN) IVPB 1000 mg/200 mL premix     1,000 mg 200  mL/hr over 60 Minutes Intravenous Every 12 hours 05/09/15 0747 05/09/15 0951   05/09/15 0800  cefUROXime (ZINACEF) 1.5 g in dextrose 5 % 50 mL IVPB     1.5 g 100 mL/hr over 30 Minutes Intravenous Every 12 hours 05/09/15 0747 05/09/15 2035   05/05/15 2000  vancomycin (VANCOCIN) IVPB 1000 mg/200 mL premix     1,000 mg 200 mL/hr over 60 Minutes Intravenous Every 12 hours 05/05/15 1347 05/05/15 2113   05/05/15 0630  vancomycin (VANCOCIN) 1,500 mg in sodium chloride 0.9 % 500 mL IVPB     1,500 mg 250 mL/hr over 120 Minutes Intravenous To ShortStay Surgical 05/04/15 1059 05/05/15 0806      Assessment/Plan: s/p Procedure(s): Right CHEST WALL RECONSTRUCTION  (N/A) TISSUE ADVANCEMENT OF CHEST WALL WITH PLACEMENT OF FLEX HD FOR RECONSTRUCTION (N/A) Requested mepalex border dressing to help preserve skin.  Continue drain care for now.  LOS: 21 days    Wallace Going 05/26/2015

## 2015-05-26 NOTE — Care Management Important Message (Signed)
Important Message  Patient Details  Name: Jerry [name] MRN: [id number] Date of Birth: 02/23/1951   Medicare Important Message Given:  Yes-second notification given    Nathen May 05/26/2015, 12:14 PM
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
## 2015-05-27 ENCOUNTER — Inpatient Hospital Stay (HOSPITAL_COMMUNITY): Payer: Medicare HMO

## 2015-05-27 LAB — BASIC METABOLIC PANEL
ANION GAP: 8 (ref 5–15)
CALCIUM: 8.7 mg/dL — AB (ref 8.9–10.3)
CO2: 34 mmol/L — AB (ref 22–32)
CREATININE: 0.66 mg/dL (ref 0.61–1.24)
Chloride: 99 mmol/L — ABNORMAL LOW (ref 101–111)
GFR calc Af Amer: >60 mL/min (ref >60)
GLUCOSE: 112 mg/dL — AB (ref 65–99)
Potassium: 3.4 mmol/L — ABNORMAL LOW (ref 3.5–5.1)
SODIUM: 141 mmol/L (ref 135–145)

## 2015-05-27 MED ORDER — BACLOFEN 50 MCG/ML IT SOLN
Freq: Once | INTRATHECAL | Status: AC
  Administered 2015-05-27: 14:00:00 via INTRATHECAL
  Filled 2015-05-27: qty 10

## 2015-05-27 MED ORDER — TAMSULOSIN HCL 0.4 MG PO CAPS
0.4000 mg | ORAL_CAPSULE | Freq: Every day | ORAL | Status: DC
  Administered 2015-05-27 – 2015-05-30 (×4): 0.4 mg via ORAL
  Filled 2015-05-27 (×5): qty 1

## 2015-05-27 NOTE — Progress Notes (Signed)
Rehab admissions - Evaluated for possible admission.  Awaiting OT consult completion so that I can submit information to [facility] to request acute inpatient rehab admission.  I will follow up once OT consult is completed.  Call me for questions.  #[phone]

## 2015-05-27 NOTE — Progress Notes (Addendum)
Patient ID: Jerry [name], male   DOB: 1950/10/12, 64 y.o.   MRN: [id number] TCTS DAILY ICU PROGRESS NOTE                   [address]            [place] [address]          [phone]   7 Days Post-Op Procedure(s) (LRB): Right CHEST WALL RECONSTRUCTION (N/A) TISSUE ADVANCEMENT OF CHEST WALL WITH PLACEMENT OF FLEX HD FOR RECONSTRUCTION (N/A)  Total Length of Stay:  LOS: 22 days   Subjective: Complaint of constipation last pm, large bm this am  Objective: Vital signs in last 24 hours: Temp:  [98.4 F (36.9 C)-99.5 F (37.5 C)] 98.4 F (36.9 C) (10/21 0720) Pulse Rate:  [48-83] 56 (10/21 0700) Cardiac Rhythm:  [-] Normal sinus rhythm (10/21 0400) Resp:  [13-29] 19 (10/21 0700) BP: (69-145)/(44-67) 135/60 mmHg (10/21 0600) SpO2:  [91 %-97 %] 97 % (10/21 0700)  Filed Weights   05/19/15 0530 05/20/15 0600 05/22/15 0600  Weight: 275 lb 5.7 oz (124.9 kg) 278 lb 7.1 oz (126.3 kg) 285 lb 0.9 oz (129.3 kg)    Weight change:    Hemodynamic parameters for last 24 hours:    Intake/Output from previous day: 10/20 0701 - 10/21 0700 In: 1040 [P.O.:660; I.V.:230; IV Piggyback:150] Out: 5038 [Urine:4220; Drains:155]  Intake/Output this shift:    Current Meds: Scheduled Meds: . budesonide-formoterol  2 puff Inhalation Daily  . enoxaparin (LOVENOX) injection  40 mg Subcutaneous Q24H  . feeding supplement (ENSURE ENLIVE)  237 mL Oral BID BM  . feeding supplement (PRO-STAT SUGAR FREE 64)  30 mL Oral BID  . furosemide  40 mg Oral Daily  . pantoprazole  40 mg Oral BID  . potassium chloride  40 mEq Oral Daily   Continuous Infusions: . 0.45 % NaCl with KCl 20 mEq / L 10 mL/hr at 05/27/15 0400   PRN Meds:.albuterol, bisacodyl, diazepam, fentaNYL (SUBLIMAZE) injection, lactulose, magnesium hydroxide, ondansetron (ZOFRAN) IV, oxyCODONE, promethazine, sodium phosphate  General appearance: alert and cooperative Neurologic: intact Heart: regular rate and rhythm,  S1, S2 normal, no murmur, click, rub or gallop Lungs: diminished breath sounds bibasilar Abdomen: mild distention, + bowel sounds Extremities: extremities normal, atraumatic, no cyanosis or edema Wound: dressing intact, drains intact  Lab Results: CBC: Recent Labs  05/26/15 0350  WBC 7.3  HGB 9.0*  HCT 29.1*  PLT 353   BMET:  Recent Labs  05/26/15 0350 05/27/15 0510  NA 139 141  K 3.4* 3.4*  CL 103 99*  CO2 30 34*  GLUCOSE 104* 112*  BUN <5* <5*  CREATININE 0.68 0.66  CALCIUM 8.2* 8.7*    PT/INR: No results for input(s): LABPROT, INR in the last 72 hours. Radiology: No results found.   Assessment/Plan: S/P Procedure(s) (LRB): Right CHEST WALL RECONSTRUCTION (N/A) TISSUE ADVANCEMENT OF CHEST WALL WITH PLACEMENT OF FLEX HD FOR RECONSTRUCTION (N/A) Mobilize  To have pain pump refilled this am Plan transfer to in patient rehab when bed ready Replace k Foley still in due to foley removed and retained urinary retention, will start flowmax.   [name] 05/27/2015 7:43 AM

## 2015-05-27 NOTE — Progress Notes (Signed)
Patient ID: RY MOODY, male   DOB: Jun 23, 1951, 64 y.o.   MRN: [id number] EVENING ROUNDS NOTE :     [address]       [place] [address]             [phone]                 7 Days Post-Op Procedure(s) (LRB): Right CHEST WALL RECONSTRUCTION (N/A) TISSUE ADVANCEMENT OF CHEST WALL WITH PLACEMENT OF FLEX HD FOR RECONSTRUCTION (N/A)  Total Length of Stay:  LOS: 22 days  BP 134/59 mmHg  Pulse 67  Temp(Src) 98.9 F (37.2 C) (Oral)  Resp 21  Ht 6\' 2"  (1.88 m)  Wt 285 lb 0.9 oz (129.3 kg)  BMI 36.58 kg/m2  SpO2 94%  .Intake/Output      10/21 0701 - 10/22 0700   P.O. 60   I.V. (mL/kg) 120 (0.9)   IV Piggyback    Total Intake(mL/kg) 180 (1.4)   Urine (mL/kg/hr) 1700 (0.9)   Drains 105 (0.1)   Stool 0 (0)   Total Output 1805   Net -1625       Stool Occurrence 1 x     . 0.45 % NaCl with KCl 20 mEq / L 10 mL/hr at 05/27/15 2000     Lab Results  Component Value Date   WBC 7.3 05/26/2015   HGB 9.0* 05/26/2015   HCT 29.1* 05/26/2015   PLT 353 05/26/2015   GLUCOSE 112* 05/27/2015   CHOL  09/01/2009    130        ATP III CLASSIFICATION:  <200     mg/dL   Desirable  200-239  mg/dL   Borderline High  >=240    mg/dL   High          TRIG 278* 05/21/2015   HDL 49 09/01/2009   LDLCALC  09/01/2009    63        Total Cholesterol/HDL:CHD Risk Coronary Heart Disease Risk Table                     Men   Women  1/2 Average Risk   3.4   3.3  Average Risk       5.0   4.4  2 X Average Risk   9.6   7.1  3 X Average Risk  23.4   11.0        Use the calculated Patient Ratio above and the CHD Risk Table to determine the patient's CHD Risk.        ATP III CLASSIFICATION (LDL):  <100     mg/dL   Optimal  100-129  mg/dL   Near or Above                    Optimal  130-159  mg/dL   Borderline  160-189  mg/dL   High  >190     mg/dL   Very High   ALT 17 05/14/2015   AST 24 05/14/2015   NA 141 05/27/2015   K 3.4* 05/27/2015   CL 99* 05/27/2015   CREATININE 0.66 05/27/2015   BUN <5* 05/27/2015   CO2 34* 05/27/2015   INR 1.20 05/19/2015   Pain pump refilled today Feels better tonight   [name] MD  Beeper [phone] Office [phone] 05/27/2015 9:20 PM

## 2015-05-27 NOTE — Progress Notes (Signed)
Occupational Therapy Evaluation Patient Details Name: Jerry [name] MRN: [id number] DOB: 1950-11-08 Today's Date: 05/27/2015    History of Present Illness Pt is a 64 y/o male who presents with a cartilaginous chest wall mass. Pt is now s/p a right anterior chest wall resection (3rd through 5th ribs), with reconstruction with polypropylene mesh and methyl methacrylate andwich on 05/06/15.   10/14 Plastics intervened, s/p Muscle and soft tissue advancement with placement of Flex HD 4 x 16 cm for closure of chest wall defect with Acell (500 mg) and VAC placement   Clinical Impression   PTA, pt modified independent with ADL and mobility. Pt presents with functional decline due to below deficits, requiring assistance with mobility and mod to max assist with ADL. Feel pt can achieve goal of becoming mod I with ADL and mobility at CIR. Will follow acutely to address established goals and facilitate D/C to next venue of care.     Follow Up Recommendations  CIR    Equipment Recommendations  Tub/shower bench    Recommendations for Other Services Rehab consult     Precautions / Restrictions Precautions Precautions: Fall Required Braces or Orthoses: Other Brace/Splint (abdominal and chest binders) Restrictions Weight Bearing Restrictions:  (unsure if pt needs to follow sternotomy precautions) asked nsg to get clarification     Mobility Bed Mobility Overal bed mobility: Needs Assistance Bed Mobility: Rolling     Supine to sit: +2 for safety/equipment;Min assist        Transfers Overall transfer level: Needs assistance Equipment used: Rolling walker (2 wheeled)             General transfer comment: min A per nsg    Balance     Sitting balance-Leahy Scale: Fair       Standing balance-Leahy Scale: Poor                              ADL Overall ADL's : Needs assistance/impaired     Grooming: Set up   Upper Body Bathing: Minimal assitance;Sitting    Lower Body Bathing: Moderate assistance;Sit to/from stand   Upper Body Dressing : Moderate assistance   Lower Body Dressing: Maximal assistance;Sit to/from stand               Functional mobility during ADLs: Minimal assistance (with nsg back to bed) General ADL Comments: decline in function. would benefit from AE  Fatigues easily but motivated to become Mod I again     Vision     Perception     Praxis      Pertinent Vitals/Pain Pain Assessment: 0-10 Faces Pain Scale: Hurts little more Pain Location: chest Pain Descriptors / Indicators: Discomfort Pain Intervention(s): Limited activity within patient's tolerance     Hand Dominance Right   Extremity/Trunk Assessment Upper Extremity Assessment Upper Extremity Assessment: Generalized weakness   Lower Extremity Assessment Lower Extremity Assessment: Defer to PT evaluation RLE Deficits / Details: grossly 4/5, Hip ER tight hindering IR RLE Coordination: decreased fine motor LLE Deficits / Details: gross weakness at 3 to 3+/5 and tightness in hip ER LLE Coordination: decreased fine motor   Cervical / Trunk Assessment Cervical / Trunk Assessment: Normal   Communication Communication Communication: No difficulties   Cognition Arousal/Alertness: Awake/alert Behavior During Therapy: WFL for tasks assessed/performed Overall Cognitive Status: Within Functional Limits for tasks assessed  General Comments       Exercises      Shoulder Instructions      Home Living Family/patient expects to be discharged to:: Private residence Living Arrangements: Children (son and 3 dogs) Available Help at Discharge: Family;Available 24 hours/day Type of Home: House Home Access: Stairs to enter [employer] of Steps: 1 small step (threshold to front door)   Home Layout: One level     Bathroom Shower/Tub: Teacher, early years/pre: Standard Bathroom Accessibility: Yes How  Accessible: Accessible via walker Home Equipment: [place] - single point;Walker - 2 wheels;Bedside commode;Grab bars - toilet;Grab bars - tub/shower          Prior Functioning/Environment Level of Independence: Independent with assistive device(s)  Lives with son, who works during the day            OT Diagnosis: Generalized weakness;Acute pain   OT Problem List: Decreased strength;Decreased range of motion;Decreased activity tolerance;Impaired balance (sitting and/or standing);Decreased knowledge of use of DME or AE;Decreased knowledge of precautions;Cardiopulmonary status limiting activity;Obesity;Impaired UE functional use;Pain   OT Treatment/Interventions: Self-care/ADL training;Therapeutic exercise;DME and/or AE instruction;Energy conservation;Therapeutic activities;Patient/family education;Balance training    OT Goals(Current goals can be found in the care plan section) Acute Rehab OT Goals Patient Stated Goal: independent again OT Goal Formulation: With patient Time For Goal Achievement: 06/10/15 Potential to Achieve Goals: Good ADL Goals Pt Will Perform Lower Body Bathing: with modified independence;sit to/from stand;with adaptive equipment Pt Will Perform Upper Body Dressing: with modified independence;sitting Pt Will Perform Lower Body Dressing: with modified independence;sit to/from stand;with adaptive equipment Pt Will Transfer to Toilet: with modified independence;ambulating;bedside commode Pt Will Perform Toileting - Clothing Manipulation and hygiene: with modified independence;sitting/lateral leans;with adaptive equipment  OT Frequency: Min 2X/week   Barriers to D/C:            Co-evaluation              End of Session Equipment Utilized During Treatment: Other (comment);Oxygen (abdominal binder) Nurse Communication: Mobility status  Activity Tolerance: Patient tolerated treatment well Patient left: in bed;with call bell/phone within reach   Time:  1330-1344 OT Time Calculation (min): 14 min Charges:  OT General Charges $OT Visit: 1 Procedure OT Evaluation $Initial OT Evaluation Tier I: 1 Procedure G-Codes:    Dhyan Noah,HILLARY 06/20/15, 2:01 PM   Maurie Boettcher, OTR/L  [phone] 2015/06/20
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
## 2015-05-27 NOTE — Progress Notes (Signed)
Physical Therapy Treatment Patient Details Name: Jerry [name] MRN: [id number] DOB: [birth year] Today's Date: 05/27/2015    History of Present Illness Pt is a 64 y/o male who presents with a cartilaginous chest wall mass. Pt is now s/p a right anterior chest wall resection (3rd through 5th ribs), with reconstruction with polypropylene mesh and methyl methacrylate andwich on 05/06/15.   10/14 Plastics intervened, s/p Muscle and soft tissue advancement with placement of Flex HD 4 x 16 cm for closure of chest wall defect with Acell (500 mg) and VAC placement    PT Comments    Progressing steadily.  Pt is still self-limiting, needing lots of encouragement to boost his confidence.  Good CIR candidate.  Follow Up Recommendations  CIR     Equipment Recommendations  Rolling walker with 5" wheels;3in1 (PT)    Recommendations for Other Services       Precautions / Restrictions Precautions Precautions: Fall Precaution Comments: 2 drains and VAC Required Braces or Orthoses: Other Brace/Splint (abdominal and chest binders) Restrictions Weight Bearing Restrictions:  (unsure if pt needs to follow sternotomy precautions)    Mobility  Bed Mobility Overal bed mobility: Needs Assistance;+2 for physical assistance Bed Mobility: Rolling     Supine to sit: Mod assist;+2 for safety/equipment;HOB elevated     General bed mobility comments: up via R UE, pt struggling to initially w/shift to L.  Pushing with R UE, but needing moderate assist  Transfers Overall transfer level: Needs assistance Equipment used: Rolling walker (2 wheeled) Transfers: Sit to/from Stand Sit to Stand: Min assist;+2 safety/equipment         General transfer comment: pt able to come forward better, needing less truncal and stability assist  Ambulation/Gait Ambulation/Gait assistance: Min assist;+2 safety/equipment;+2 physical assistance Ambulation Distance (Feet): 10 Feet (forward and back, then 12' forward  with bari RW) Assistive device: Rolling walker (2 wheeled) Gait Pattern/deviations: Step-through pattern;Decreased step length - right;Decreased step length - left Gait velocity: Decreased   General Gait Details: Step length is improving, but still is short with heavy use of the RW   Stairs            Wheelchair Mobility    Modified Rankin (Stroke Patients Only)       Balance Overall balance assessment: Needs assistance Sitting-balance support: No upper extremity supported;Single extremity supported Sitting balance-Leahy Scale: Fair       Standing balance-Leahy Scale: Poor Standing balance comment: reliant on the RW                    Cognition Arousal/Alertness: Awake/alert Behavior During Therapy: WFL for tasks assessed/performed Overall Cognitive Status: Within Functional Limits for tasks assessed                      Exercises Other Exercises Other Exercises: Resisted hip/knee flexion/extention exercise prior to mobility x10 reps    General Comments General comments (skin integrity, edema, etc.): VSS      Pertinent Vitals/Pain Pain Assessment: 0-10 Faces Pain Scale: Hurts little more Pain Location: chest Pain Descriptors / Indicators: Grimacing Pain Intervention(s): Limited activity within patient's tolerance    Home Living Family/patient expects to be discharged to:: Private residence Living Arrangements: Children (son and 3 dogs) Available Help at Discharge: Family;Available 24 hours/day Type of Home: House Home Access: Stairs to enter   Home Layout: One level Home Equipment: Cane - single point;Walker - 2 wheels;Bedside commode;Grab bars - toilet;Grab bars - tub/shower  Prior Function Level of Independence: Independent with assistive device(s)          PT Goals (current goals can now be found in the care plan section) Acute Rehab PT Goals Patient Stated Goal: independent again PT Goal Formulation: With  patient/family Time For Goal Achievement: 06/07/15 Potential to Achieve Goals: Good Progress towards PT goals: Progressing toward goals    Frequency  Min 3X/week    PT Plan Current plan remains appropriate    Co-evaluation             End of Session   Activity Tolerance: Patient tolerated treatment well;Patient limited by fatigue Patient left: in chair;with call bell/phone within reach;with family/visitor present     Time: 1015-1056 PT Time Calculation (min) (ACUTE ONLY): 41 min  Charges:  $Gait Training: 8-22 mins $Therapeutic Activity: 23-37 mins                    G Codes:      Chikita Dogan, Tessie Fass 05/27/2015, 3:50 PM 05/27/2015  Donnella Sham, PT [phone]  (pager)

## 2015-05-27 NOTE — Progress Notes (Addendum)
Pharmacy Progress Note  Patient on indwelling pain pump that gives him 12 mg of morphine, 24 micrograms of baclofen and 6 mg of bupivacaine per day.   I spoke with both the patient and his medication supplier ([facility], [phone]) again today 10/18. Per supplier the pump is due for refilling on 10/22 but will provide a refill alarm on 10/21.   Discussed situation with Rn [place] at [facility] that patient will likely be in hospital at least a few more days and plans will need to be made for in-hospital refill. She is checking with her physician supervisor and calling me back when more information is known.   10/19: received script for pain pump refill by Dr.Hendrickson, will send to specialty pharmacy today for delivery/refill 10/21. We discussed that a MD or extender will have to do the physical refill. Have been in contact with Luisa Dago with [employer] [phone]) who will be available to help facilitate refill.  Will need to follow up delivery of medication and scheduling of refill with MD/extender and [employer] representative.  10/20:Medication to be delivered 10/21 for refill. Given schedules will tentatively plan on refill to be done tomorrow afternoon ~130-2. Will need to clarify with surgery/extenders in am for any changes.  10/21: IT pain pulled refilled at bedside by Gold P.A. Next refill alarm date is set for 07/30/15. [employer] rep will set up refill as outpatient ~1week before alarm date.  Erin Hearing PharmD., BCPS Clinical Pharmacist [phone]  05/27/2015 2:31 PM

## 2015-05-27 NOTE — Progress Notes (Signed)
Rehab admissions - I am opening the case with [facility] medicare requesting acute inpatient rehab admission.  It is unlikely that I will hear back from insurance carrier today.  I will update all once I hear back from insurance carrier.  Call me for questions.  #[phone]

## 2015-05-27 NOTE — Progress Notes (Signed)
      [address]       ,[place] [address]             [phone]     Patients indwelling pain pump refilled using sterile technique usual protocol .   Dosing syringe prepared by pharmacist   Tolerated well     GOLD,WAYNE E, PA-C
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
## 2015-05-28 LAB — BASIC METABOLIC PANEL
Anion gap: 8 (ref 5–15)
BUN: <5 mg/dL — ABNORMAL LOW (ref 6–20)
CO2: 34 mmol/L — ABNORMAL HIGH (ref 22–32)
Calcium: 8.4 mg/dL — ABNORMAL LOW (ref 8.9–10.3)
Chloride: 94 mmol/L — ABNORMAL LOW (ref 101–111)
Creatinine, Ser: 0.73 mg/dL (ref 0.61–1.24)
GFR calc Af Amer: >60 mL/min (ref >60)
GFR calc non Af Amer: >60 mL/min (ref >60)
Glucose, Bld: 116 mg/dL — ABNORMAL HIGH (ref 65–99)
Potassium: 2.9 mmol/L — ABNORMAL LOW (ref 3.5–5.1)
Sodium: 136 mmol/L (ref 135–145)

## 2015-05-28 LAB — CBC
HCT: 30.1 % — ABNORMAL LOW (ref 39.0–52.0)
Hemoglobin: 9.5 g/dL — ABNORMAL LOW (ref 13.0–17.0)
MCH: 28.9 pg (ref 26.0–34.0)
MCHC: 31.6 g/dL (ref 30.0–36.0)
MCV: 91.5 fL (ref 78.0–100.0)
Platelets: 364 10*3/uL (ref 150–400)
RBC: 3.29 MIL/uL — ABNORMAL LOW (ref 4.22–5.81)
RDW: 14.5 % (ref 11.5–15.5)
WBC: 7.7 10*3/uL (ref 4.0–10.5)

## 2015-05-28 MED ORDER — BISACODYL 5 MG PO TBEC
10.0000 mg | DELAYED_RELEASE_TABLET | Freq: Every morning | ORAL | Status: DC
  Administered 2015-05-28 – 2015-05-29 (×2): 10 mg via ORAL
  Filled 2015-05-28 (×3): qty 2

## 2015-05-28 MED ORDER — DOCUSATE SODIUM 100 MG PO CAPS
100.0000 mg | ORAL_CAPSULE | Freq: Two times a day (BID) | ORAL | Status: DC | PRN
  Administered 2015-05-28 – 2015-05-29 (×2): 100 mg via ORAL
  Filled 2015-05-28 (×3): qty 1

## 2015-05-28 MED ORDER — POTASSIUM CHLORIDE 10 MEQ/50ML IV SOLN
10.0000 meq | INTRAVENOUS | Status: AC | PRN
  Administered 2015-05-28 (×3): 10 meq via INTRAVENOUS

## 2015-05-28 NOTE — Progress Notes (Signed)
Patient ID: Jerry [name], male   DOB: July 07, 1951, 64 y.o.   MRN: [id number] TCTS DAILY ICU PROGRESS NOTE                   [address]            [place] [address]          [phone]   8 Days Post-Op Procedure(s) (LRB): Right CHEST WALL RECONSTRUCTION (N/A) TISSUE ADVANCEMENT OF CHEST WALL WITH PLACEMENT OF FLEX HD FOR RECONSTRUCTION (N/A)  Total Length of Stay:  LOS: 23 days   Subjective: Feels better, eating better  Objective: Vital signs in last 24 hours: Temp:  [98.3 F (36.8 C)-98.9 F (37.2 C)] 98.3 F (36.8 C) (10/22 0800) Pulse Rate:  [50-81] 56 (10/22 0800) Cardiac Rhythm:  [-] Sinus bradycardia (10/22 0800) Resp:  [13-25] 19 (10/22 0800) BP: (106-134)/(46-67) 130/67 mmHg (10/22 0800) SpO2:  [91 %-100 %] 94 % (10/22 0848)  Filed Weights   05/19/15 0530 05/20/15 0600 05/22/15 0600  Weight: 275 lb 5.7 oz (124.9 kg) 278 lb 7.1 oz (126.3 kg) 285 lb 0.9 oz (129.3 kg)    Weight change:    Hemodynamic parameters for last 24 hours:    Intake/Output from previous day: 10/21 0701 - 10/22 0700 In: 770 [P.O.:420; I.V.:200; IV Piggyback:150] Out: 2325 [Urine:2160; Drains:165]  Intake/Output this shift: Total I/O In: -  Out: 75 [Urine:75]  Current Meds: Scheduled Meds: . bisacodyl  10 mg Oral q morning - 10a  . budesonide-formoterol  2 puff Inhalation Daily  . enoxaparin (LOVENOX) injection  40 mg Subcutaneous Q24H  . feeding supplement (ENSURE ENLIVE)  237 mL Oral BID BM  . feeding supplement (PRO-STAT SUGAR FREE 64)  30 mL Oral BID  . furosemide  40 mg Oral Daily  . pantoprazole  40 mg Oral BID  . potassium chloride  40 mEq Oral Daily  . tamsulosin  0.4 mg Oral QPC supper   Continuous Infusions: . 0.45 % NaCl with KCl 20 mEq / L 10 mL/hr at 05/28/15 0400   PRN Meds:.albuterol, bisacodyl, diazepam, docusate sodium, fentaNYL (SUBLIMAZE) injection, lactulose, magnesium hydroxide, ondansetron (ZOFRAN) IV, oxyCODONE, promethazine,  sodium phosphate  General appearance: alert and cooperative Neurologic: intact Heart: regular rate and rhythm, S1, S2 normal, no murmur, click, rub or gallop Lungs: diminished breath sounds bibasilar Abdomen: soft, non-tender; bowel sounds normal; no masses,  no organomegaly Extremities: extremities normal, atraumatic, no cyanosis or edema and Homans sign is negative, no sign of DVT Wound: drains still in place , remove when plastics decides  Lab Results: CBC: Recent Labs  05/26/15 0350 05/28/15 0400  WBC 7.3 7.7  HGB 9.0* 9.5*  HCT 29.1* 30.1*  PLT 353 364   BMET:  Recent Labs  05/27/15 0510 05/28/15 0400  NA 141 136  K 3.4* 2.9*  CL 99* 94*  CO2 34* 34*  GLUCOSE 112* 116*  BUN <5* <5*  CREATININE 0.66 0.73  CALCIUM 8.7* 8.4*    PT/INR: No results for input(s): LABPROT, INR in the last 72 hours. Radiology: No results found.   Assessment/Plan: S/P Procedure(s) (LRB): Right CHEST WALL RECONSTRUCTION (N/A) TISSUE ADVANCEMENT OF CHEST WALL WITH PLACEMENT OF FLEX HD FOR RECONSTRUCTION (N/A) Mobilize Replace Maryan Puls 05/28/2015 9:00 AM

## 2015-05-28 NOTE — Progress Notes (Signed)
Patient ID: Jerry [name], male   DOB: 1950-12-25, 64 y.o.   MRN: [id number]  EVENING ROUNDS NOTE :     [address]       [place] [address]             [phone]                 8 Days Post-Op Procedure(s) (LRB): Right CHEST WALL RECONSTRUCTION (N/A) TISSUE ADVANCEMENT OF CHEST WALL WITH PLACEMENT OF FLEX HD FOR RECONSTRUCTION (N/A)  Total Length of Stay:  LOS: 23 days  BP 129/56 mmHg  Pulse 78  Temp(Src) 98.4 F (36.9 C) (Axillary)  Resp 16  Ht 6\' 2"  (1.88 m)  Wt 285 lb 0.9 oz (129.3 kg)  BMI 36.58 kg/m2  SpO2 95%  .Intake/Output      10/22 0701 - 10/23 0700   P.O. 540   I.V. (mL/kg) 20 (0.2)   IV Piggyback    Total Intake(mL/kg) 560 (4.3)   Urine (mL/kg/hr) 1490 (0.9)   Drains 30 (0)   Stool 3 (0)   Total Output 1523   Net -963         . 0.45 % NaCl with KCl 20 mEq / L 10 mL/hr at 05/28/15 0400     Lab Results  Component Value Date   WBC 7.7 05/28/2015   HGB 9.5* 05/28/2015   HCT 30.1* 05/28/2015   PLT 364 05/28/2015   GLUCOSE 116* 05/28/2015   CHOL  09/01/2009    130        ATP III CLASSIFICATION:  <200     mg/dL   Desirable  200-239  mg/dL   Borderline High  >=240    mg/dL   High          TRIG 278* 05/21/2015   HDL 49 09/01/2009   LDLCALC  09/01/2009    63        Total Cholesterol/HDL:CHD Risk Coronary Heart Disease Risk Table                     Men   Women  1/2 Average Risk   3.4   3.3  Average Risk       5.0   4.4  2 X Average Risk   9.6   7.1  3 X Average Risk  23.4   11.0        Use the calculated Patient Ratio above and the CHD Risk Table to determine the patient's CHD Risk.        ATP III CLASSIFICATION (LDL):  <100     mg/dL   Optimal  100-129  mg/dL   Near or Above                    Optimal  130-159  mg/dL   Borderline  160-189  mg/dL   High  >190     mg/dL   Very High   ALT 17 05/14/2015   AST 24 05/14/2015   NA 136 05/28/2015   K 2.9* 05/28/2015   CL 94* 05/28/2015   CREATININE 0.73 05/28/2015    BUN <5* 05/28/2015   CO2 34* 05/28/2015   INR 1.20 05/19/2015   Better day, ambulating with help  [name] MD  Beeper [phone] Office [phone] 05/28/2015 7:17 PM

## 2015-05-28 NOTE — Progress Notes (Signed)
Pt has been encouraged throughout the day to increase activity and become more motivated. Pt was educated to the need to increase po intake, rational of good nutrition and the healing process discussed. Pt was educated to the detrimental effects of remaining on the [facility] and bedpan for extended periods of time. Pt was receptive to teaching but needs reinforcement and encouragement.

## 2015-05-29 ENCOUNTER — Inpatient Hospital Stay (HOSPITAL_COMMUNITY): Payer: Medicare HMO

## 2015-05-29 LAB — CBC
HCT: 30.4 % — ABNORMAL LOW (ref 39.0–52.0)
Hemoglobin: 9.2 g/dL — ABNORMAL LOW (ref 13.0–17.0)
MCH: 27.6 pg (ref 26.0–34.0)
MCHC: 30.3 g/dL (ref 30.0–36.0)
MCV: 91.3 fL (ref 78.0–100.0)
Platelets: 337 10*3/uL (ref 150–400)
RBC: 3.33 MIL/uL — ABNORMAL LOW (ref 4.22–5.81)
RDW: 14.5 % (ref 11.5–15.5)
WBC: 7.3 10*3/uL (ref 4.0–10.5)

## 2015-05-29 LAB — BASIC METABOLIC PANEL
Anion gap: 7 (ref 5–15)
BUN: 5 mg/dL — ABNORMAL LOW (ref 6–20)
CO2: 35 mmol/L — ABNORMAL HIGH (ref 22–32)
Calcium: 8.4 mg/dL — ABNORMAL LOW (ref 8.9–10.3)
Chloride: 94 mmol/L — ABNORMAL LOW (ref 101–111)
Creatinine, Ser: 0.78 mg/dL (ref 0.61–1.24)
GFR calc Af Amer: >60 mL/min (ref >60)
GFR calc non Af Amer: >60 mL/min (ref >60)
Glucose, Bld: 111 mg/dL — ABNORMAL HIGH (ref 65–99)
Potassium: 2.9 mmol/L — ABNORMAL LOW (ref 3.5–5.1)
Sodium: 136 mmol/L (ref 135–145)

## 2015-05-29 MED ORDER — POTASSIUM CHLORIDE 10 MEQ/50ML IV SOLN
10.0000 meq | INTRAVENOUS | Status: AC
  Administered 2015-05-29 (×3): 10 meq via INTRAVENOUS
  Filled 2015-05-29 (×3): qty 50

## 2015-05-29 MED ORDER — POTASSIUM CHLORIDE CRYS ER 20 MEQ PO TBCR
40.0000 meq | EXTENDED_RELEASE_TABLET | Freq: Two times a day (BID) | ORAL | Status: DC
  Administered 2015-05-29 – 2015-05-31 (×5): 40 meq via ORAL
  Filled 2015-05-29 (×6): qty 2

## 2015-05-29 NOTE — Progress Notes (Signed)
Patient ID: GARAN FRAPPIER, male   DOB: March 15, 1951, 64 y.o.   MRN: [id number] EVENING ROUNDS NOTE :     [address]       [place] [address]             [phone]                 9 Days Post-Op Procedure(s) (LRB): Right CHEST WALL RECONSTRUCTION (N/A) TISSUE ADVANCEMENT OF CHEST WALL WITH PLACEMENT OF FLEX HD FOR RECONSTRUCTION (N/A)  Total Length of Stay:  LOS: 24 days  BP 104/53 mmHg  Pulse 53  Temp(Src) 97.7 F (36.5 C) (Oral)  Resp 25  Ht 6\' 2"  (1.88 m)  Wt 285 lb 0.9 oz (129.3 kg)  BMI 36.58 kg/m2  SpO2 92%  .Intake/Output      10/22 0701 - 10/23 0700 10/23 0701 - 10/24 0700   P.O. 1020 900   I.V. (mL/kg) 20 (0.2)    IV Piggyback  150   Total Intake(mL/kg) 1040 (8) 1050 (8.1)   Urine (mL/kg/hr) 1830 (0.6) 1930 (1.4)   Drains 80 (0) 20 (0)   Stool 3 (0)    Total Output 1913 1950   Net -873 -900          . 0.45 % NaCl with KCl 20 mEq / L 10 mL/hr at 05/28/15 0400     Lab Results  Component Value Date   WBC 7.3 05/29/2015   HGB 9.2* 05/29/2015   HCT 30.4* 05/29/2015   PLT 337 05/29/2015   GLUCOSE 111* 05/29/2015   CHOL  09/01/2009    130        ATP III CLASSIFICATION:  <200     mg/dL   Desirable  200-239  mg/dL   Borderline High  >=240    mg/dL   High          TRIG 278* 05/21/2015   HDL 49 09/01/2009   LDLCALC  09/01/2009    63        Total Cholesterol/HDL:CHD Risk Coronary Heart Disease Risk Table                     Men   Women  1/2 Average Risk   3.4   3.3  Average Risk       5.0   4.4  2 X Average Risk   9.6   7.1  3 X Average Risk  23.4   11.0        Use the calculated Patient Ratio above and the CHD Risk Table to determine the patient's CHD Risk.        ATP III CLASSIFICATION (LDL):  <100     mg/dL   Optimal  100-129  mg/dL   Near or Above                    Optimal  130-159  mg/dL   Borderline  160-189  mg/dL   High  >190     mg/dL   Very High   ALT 17 05/14/2015   AST 24 05/14/2015   NA 136 05/29/2015   K 2.9* 05/29/2015   CL 94* 05/29/2015   CREATININE 0.78 05/29/2015   BUN 5* 05/29/2015   CO2 35* 05/29/2015   INR 1.20 05/19/2015   Stable day  [name] MD  Beeper [phone] Office [phone] 05/29/2015 5:33 PM

## 2015-05-29 NOTE — Plan of Care (Signed)
Continues to eat poorly and also refusing ensure supplements.

## 2015-05-29 NOTE — Plan of Care (Signed)
Problem: Phase I Progression Outcomes Goal: Voiding-avoid urinary catheter unless indicated Outcome: Not Met (add Reason) Pt had urinary retention, Foley replaced

## 2015-05-29 NOTE — Progress Notes (Addendum)
Patient ID: Jerry [name], male   DOB: Apr 26, 1951, 64 y.o.   MRN: [id number] TCTS DAILY ICU PROGRESS NOTE                   [address]            [place] [address]          [phone]   9 Days Post-Op Procedure(s) (LRB): Right CHEST WALL RECONSTRUCTION (N/A) TISSUE ADVANCEMENT OF CHEST WALL WITH PLACEMENT OF FLEX HD FOR RECONSTRUCTION (N/A)  Total Length of Stay:  LOS: 24 days   Subjective: Awake and alert , bowels  Working   Objective: Vital signs in last 24 hours: Temp:  [98.2 F (36.8 C)-98.5 F (36.9 C)] 98.2 F (36.8 C) (10/22 2000) Pulse Rate:  [45-78] 48 (10/23 0800) Cardiac Rhythm:  [-] Sinus bradycardia (10/22 2000) Resp:  [15-25] 16 (10/23 0800) BP: (113-139)/(39-62) 120/49 mmHg (10/23 0800) SpO2:  [91 %-97 %] 93 % (10/23 0817)  Filed Weights   05/19/15 0530 05/20/15 0600 05/22/15 0600  Weight: 275 lb 5.7 oz (124.9 kg) 278 lb 7.1 oz (126.3 kg) 285 lb 0.9 oz (129.3 kg)    Weight change:    Hemodynamic parameters for last 24 hours:    Intake/Output from previous day: 10/22 0701 - 10/23 0700 In: 1040 [P.O.:1020; I.V.:20] Out: 1893 [Urine:1810; Drains:80; Stool:3]  Intake/Output this shift: Total I/O In: 50 [IV Piggyback:50] Out: -   Current Meds: Scheduled Meds: . bisacodyl  10 mg Oral q morning - 10a  . budesonide-formoterol  2 puff Inhalation Daily  . enoxaparin (LOVENOX) injection  40 mg Subcutaneous Q24H  . feeding supplement (ENSURE ENLIVE)  237 mL Oral BID BM  . feeding supplement (PRO-STAT SUGAR FREE 64)  30 mL Oral BID  . furosemide  40 mg Oral Daily  . pantoprazole  40 mg Oral BID  . potassium chloride  10 mEq Intravenous Q1 Hr x 3  . potassium chloride  40 mEq Oral Daily  . tamsulosin  0.4 mg Oral QPC supper   Continuous Infusions: . 0.45 % NaCl with KCl 20 mEq / L 10 mL/hr at 05/28/15 0400   PRN Meds:.albuterol, bisacodyl, diazepam, docusate sodium, fentaNYL (SUBLIMAZE) injection, lactulose, magnesium  hydroxide, ondansetron (ZOFRAN) IV, oxyCODONE, promethazine, sodium phosphate  General appearance: alert, cooperative and no distress Neurologic: intact Heart: regular rate and rhythm, S1, S2 normal, no murmur, click, rub or gallop Lungs: diminished breath sounds bibasilar Abdomen: soft, non-tender; bowel sounds normal; no masses,  no organomegaly Extremities: extremities normal, atraumatic, no cyanosis or edema and Homans sign is negative, no sign of DVT Wound: 50-20 ml from drains every 12 hours Foley still for uniranry retention  Lab Results: CBC:  Recent Labs  05/28/15 0400 05/29/15 0415  WBC 7.7 7.3  HGB 9.5* 9.2*  HCT 30.1* 30.4*  PLT 364 337   BMET:   Recent Labs  05/28/15 0400 05/29/15 0415  NA 136 136  K 2.9* 2.9*  CL 94* 94*  CO2 34* 35*  GLUCOSE 116* 111*  BUN <5* 5*  CREATININE 0.73 0.78  CALCIUM 8.4* 8.4*    PT/INR: No results for input(s): LABPROT, INR in the last 72 hours. Radiology: No results found. Chest xray done today no report yet but stable  Assessment/Plan: S/P Procedure(s) (LRB): Right CHEST WALL RECONSTRUCTION (N/A) TISSUE ADVANCEMENT OF CHEST WALL WITH PLACEMENT OF FLEX HD FOR RECONSTRUCTION (N/A) Mobilize Diuresis d/c foley tonight  and  Check bladder scan in  am Drains and wound care per plastics Still low on KCL replaced increase to po 40 bid   Jerry [name] 05/29/2015 8:41 AM

## 2015-05-30 LAB — BASIC METABOLIC PANEL
Anion gap: 9 (ref 5–15)
BUN: <5 mg/dL — ABNORMAL LOW (ref 6–20)
CO2: 35 mmol/L — ABNORMAL HIGH (ref 22–32)
Calcium: 8.6 mg/dL — ABNORMAL LOW (ref 8.9–10.3)
Chloride: 94 mmol/L — ABNORMAL LOW (ref 101–111)
Creatinine, Ser: 0.79 mg/dL (ref 0.61–1.24)
GFR calc Af Amer: >60 mL/min (ref >60)
GFR calc non Af Amer: >60 mL/min (ref >60)
Glucose, Bld: 101 mg/dL — ABNORMAL HIGH (ref 65–99)
Potassium: 3.4 mmol/L — ABNORMAL LOW (ref 3.5–5.1)
Sodium: 138 mmol/L (ref 135–145)

## 2015-05-30 LAB — CBC
HCT: 31.2 % — ABNORMAL LOW (ref 39.0–52.0)
Hemoglobin: 9.8 g/dL — ABNORMAL LOW (ref 13.0–17.0)
MCH: 28.3 pg (ref 26.0–34.0)
MCHC: 31.4 g/dL (ref 30.0–36.0)
MCV: 90.2 fL (ref 78.0–100.0)
Platelets: 345 10*3/uL (ref 150–400)
RBC: 3.46 MIL/uL — ABNORMAL LOW (ref 4.22–5.81)
RDW: 14.4 % (ref 11.5–15.5)
WBC: 7.5 10*3/uL (ref 4.0–10.5)

## 2015-05-30 MED FILL — Medication: Qty: 1 | Status: AC

## 2015-05-30 NOTE — Progress Notes (Signed)
10 Days Post-Op Procedure(s) (LRB): Right CHEST WALL RECONSTRUCTION (N/A) TISSUE ADVANCEMENT OF CHEST WALL WITH PLACEMENT OF FLEX HD FOR RECONSTRUCTION (N/A) Subjective: No complaints this AM  Objective: Vital signs in last 24 hours: Temp:  [97 F (36.1 C)-99 F (37.2 C)] 98.7 F (37.1 C) (10/24 0727) Pulse Rate:  [47-72] 51 (10/24 0700) Cardiac Rhythm:  [-] Sinus bradycardia (10/23 2300) Resp:  [15-25] 17 (10/24 0700) BP: (104-142)/(42-70) 128/47 mmHg (10/24 0700) SpO2:  [91 %-97 %] 94 % (10/24 0700) Weight:  [267 lb 13.7 oz (121.5 kg)] 267 lb 13.7 oz (121.5 kg) (10/24 0500)  Hemodynamic parameters for last 24 hours:    Intake/Output from previous day: 10/23 0701 - 10/24 0700 In: 2070 [P.O.:1920; IV Piggyback:150] Out: 2245 [Urine:2175; Drains:70] Intake/Output this shift:    General appearance: alert, cooperative and no distress Neurologic: intact Heart: regular rate and rhythm Lungs: clear to auscultation bilaterally Wound: clean and dry  Lab Results:  Recent Labs  05/28/15 0400 05/29/15 0415  WBC 7.7 7.3  HGB 9.5* 9.2*  HCT 30.1* 30.4*  PLT 364 337   BMET:  Recent Labs  05/28/15 0400 05/29/15 0415  NA 136 136  K 2.9* 2.9*  CL 94* 94*  CO2 34* 35*  GLUCOSE 116* 111*  BUN <5* 5*  CREATININE 0.73 0.78  CALCIUM 8.4* 8.4*    PT/INR: No results for input(s): LABPROT, INR in the last 72 hours. ABG    Component Value Date/Time   PHART 7.367 05/22/2015 0953   HCO3 27.9* 05/22/2015 0953   TCO2 29 05/22/2015 0953   O2SAT 94.0 05/22/2015 0953   CBG (last 3)  No results for input(s): GLUCAP in the last 72 hours.  Assessment/Plan: S/P Procedure(s) (LRB): Right CHEST WALL RECONSTRUCTION (N/A) TISSUE ADVANCEMENT OF CHEST WALL WITH PLACEMENT OF FLEX HD FOR RECONSTRUCTION (N/A) -  Continues to progress Pain control improved since pump refilled Making progress with PT- will benefit from rehab Medically ready for CIR when bed available   LOS: 25 days     [name] 05/30/2015

## 2015-05-30 NOTE — Evaluation (Signed)
Occupational Therapy Evaluation Patient Details Name: Jerry [name] MRN: [id number] DOB: 16-May-1951 Today's Date: 05/30/2015    History of Present Illness Pt is a 64 y.o. male who presents with a cartilaginous chest wall mass. Pt is now s/p a right anterior chest wall resection (3rd through 5th ribs), with reconstruction with polypropylene mesh and methyl methacrylate andwich on 05/06/15.   10/14 Plastics intervened, s/p Muscle and soft tissue advancement with placement of Flex HD 4 x 16 cm for closure of chest wall defect with Acell (500 mg) and VAC placement   Clinical Impression   Pt progressing. Continue to recommend CIR for rehab.    Follow Up Recommendations  CIR    Equipment Recommendations  Tub/shower bench    Recommendations for Other Services Rehab consult     Precautions / Restrictions Precautions Precautions: Fall Restrictions Weight Bearing Restrictions: No      Mobility Bed Mobility               General bed mobility comments: up already  Transfers Overall transfer level: Needs assistance Equipment used: Rolling walker (2 wheeled) Transfers: Sit to/from Stand Sit to Stand: Min assist (Min assist from lower surface, otherwise Min guard.)              Balance Overall balance assessment: Needs assistance Sitting-balance support: No upper extremity supported Sitting balance-Leahy Scale: Fair Sitting balance - Comments: sitting down to do various tasks, that involve reaching outside BOS, to floor.  Working on w/shifting to get body in proper position.   Standing balance support: Bilateral upper extremity supported;Single extremity supported Standing balance-Leahy Scale: Poor Standing balance comment: standing 3-4 minutes working on standing tolerance, w/shifting and reaching in/out of BOS to complete extensive pericare and clean up activity. with 2 person safety                            ADL Overall ADL's : Needs  assistance/impaired     Grooming: Wash/dry face;Applying deodorant;Min guard;Sitting;Standing;Wash/dry hands   Upper Body Bathing: Min guard;Standing   Lower Body Bathing: Moderate assistance;Sit to/from stand     Upper Body Dressing Details (indicate cue type and reason): Therapists assisted with gown due to wires/lines but pt did assist. Lower Body Dressing: Minimal assistance;Sitting/lateral leans Lower Body Dressing Details (indicate cue type and reason): socks; pt able to doff socks and assist given to start donning and he finished. Toilet Transfer: +2 for safety/equipment;Min guard;RW;BSC   Toileting- Clothing Manipulation and Hygiene: Sit to/from stand;Maximal assistance       Functional mobility during ADLs: Min guard;+2 for safety/equipment       Vision     Perception     Praxis      Pertinent Vitals/Pain Pain Assessment: No/denies pain; sats on RA during sitting and standing endurance activity stayed at mid 90% or above.Until fatigued at approx 40 min, HR stayed in the 90's bpm, but did go up to around 140.     Hand Dominance     Extremity/Trunk Assessment             Communication     Cognition Arousal/Alertness: Awake/alert Behavior During Therapy: WFL for tasks assessed/performed Overall Cognitive Status: Within Functional Limits for tasks assessed                     General Comments       Exercises       Shoulder Instructions  Home Living                                          Prior Functioning/Environment               OT Diagnosis:     OT Problem List:     OT Treatment/Interventions:      OT Goals(Current goals can be found in the care plan section) Acute Rehab OT Goals Patient Stated Goal: not stated OT Goal Formulation: With patient Time For Goal Achievement: 06/10/15 Potential to Achieve Goals: Good ADL Goals Pt Will Perform Lower Body Bathing: with modified independence;sit to/from  stand;with adaptive equipment Pt Will Perform Upper Body Dressing: with modified independence;sitting Pt Will Perform Lower Body Dressing: with modified independence;sit to/from stand;with adaptive equipment Pt Will Transfer to Toilet: with modified independence;ambulating;bedside commode Pt Will Perform Toileting - Clothing Manipulation and hygiene: with modified independence;sitting/lateral leans;with adaptive equipment  OT Frequency: Min 2X/week   Barriers to D/C:            Co-evaluation PT/OT/SLP Co-Evaluation/Treatment: Yes Reason for Co-Treatment: For patient/therapist safety PT goals addressed during session: Mobility/safety with mobility OT goals addressed during session: ADL's and self-care;Other (comment) (functional mobility)      End of Session Equipment Utilized During Treatment: Rolling walker  Activity Tolerance: Patient tolerated treatment well (elevated HR toward end of session-took break) Patient left: in chair;with call bell/phone within reach;with family/visitor present   Time: 1004-1043 (approximately 5 minutes for BM) OT Time Calculation (min): 39 min Charges:  OT General Charges $OT Visit: 1 Procedure OT Treatments $Self Care/Home Management : 8-22 mins G-CodesBenito Mccreedy OTR/L [phone] 05/30/2015, 2:03 PM

## 2015-05-30 NOTE — Clinical Social Work Note (Signed)
Clinical Social Work Assessment  Patient Details  Name: Jerry [name] MRN: [id number] Date of Birth: [birth year]  Date of referral:  05/30/15               Reason for consult:  Facility Placement                Permission sought to share information with:  Chartered certified accountant granted to share information::  Yes, Verbal Permission Granted (son, Teacher, adult education)  Name::        Agency::   [facility] SNFs)  Relationship::     Contact Information:     Housing/Transportation Living arrangements for the past 2 months:  [place] of Information:  Patient (and son Teacher, adult education (at bedside)) Patient Interpreter Needed:  None Criminal Activity/Legal Involvement Pertinent to Current Situation/Hospitalization:    Significant Relationships:  Adult Children Lives with:    Do you feel safe going back to the place where you live?  No (rehabilitation needed) Need for family participation in patient care:  No (Coment)  Care giving concerns:  Son was at bedside (patient's request) during assessment.  Son states patient cannot care for himself at home, but does not wish for patient to be at a SNF due to bad experience in the past with [facility].   Social Worker assessment / plan:  CSW assessed patient while patient sitting in bedside chair.  Patient currently has CIR recommendation from PT.  CIR states the approval from [facility]) is unlikely.  Patient requests to speak with CIR regarding this process.  CSW advised CIR admission's coordinator of this request.  Patient states he has received STR at [name] in the past and was highly dissatisfied.  Patient son, at bedside, in agreement with patient's dissatisfaction with the facility.  Patient states he would like to receive STR at [facility] and is afraid he will not progress as needed at SNF.  Patient is agreeable for CSW to begin SNF search though very reluctant to receive STR  at SNF.  Patient states he would like to be placed near his son, Jerry [name]'s house if possible.  Of note, Jerry [name] lives off of [address].  CSW reviewed SNF list with patient and son and will begin SNF search (pending [employer] decision).    Employment status:  Retired Forensic scientist:  Architectural technologist) PT Recommendations:  Inpatient [place] / Referral to community resources:  [place]  Patient/Family's Response to care:  Patient and son are both wishing for CIR admission at time of medical stability.  Patient and son are both agreeable to SNF search, but states will not entertain [facility]  Patient/Family's Understanding of and Emotional Response to Diagnosis, Current Treatment, and Prognosis:  Patient and son are both well aware of needed STR at time of discharge.  Patient wishes to progress at a higher pace than SNFs are willing to provide therapies.  Patient and son are both realistic and hopeful for increased independence once STR is complete.  Emotional Assessment Appearance:  Appears stated age Attitude/Demeanor/Rapport:   (appropriate) Affect (typically observed):  Adaptable, Accepting, Appropriate, Hopeful Orientation:  Oriented to Self, Oriented to Place, Oriented to  Time, Oriented to Situation Alcohol / Substance use:  Not Applicable Psych involvement (Current and /or in the community):  No (Comment)  Discharge Needs  Concerns to be addressed:  No discharge needs identified Readmission within the last 30 days:  No Current discharge  risk:  None Barriers to Discharge:  Continued Medical Work up, [employer]   Dulcy Fanny, LCSW 05/30/2015, 1:13 PM

## 2015-05-30 NOTE — Clinical Social Work Placement (Signed)
   CLINICAL SOCIAL WORK PLACEMENT  NOTE  Date:  05/30/2015  Patient Details  Name: Jerry [name] MRN: [id number] Date of Birth: October 09, 1950  Clinical Social Work is seeking post-discharge placement for this patient at the [place] level of care (*CSW will initial, date and re-position this form in  chart as items are completed):  Yes   Patient/family provided with [place] Work Department's list of facilities offering this level of care within the geographic area requested by the patient (or if unable, by the patient's family).  Yes   Patient/family informed of their freedom to choose among providers that offer the needed level of care, that participate in Medicare, Medicaid or managed care program needed by the patient, have an available bed and are willing to accept the patient.  Yes   Patient/family informed of [facility]'s ownership interest in [facility] and [facility], as well as of the fact that they are under no obligation to receive care at these facilities.  PASRR submitted to EDS on       PASRR number received on       Existing PASRR number confirmed on 05/30/15     FL2 transmitted to all facilities in geographic area requested by pt/family on 05/30/15     FL2 transmitted to all facilities within larger geographic area on       Patient informed that his/her managed care company has contracts with or will negotiate with certain facilities, including the following:            Patient/family informed of bed offers received.  Patient chooses bed at       Physician recommends and patient chooses bed at      Patient to be transferred to   on  .  Patient to be transferred to facility by       Patient family notified on   of transfer.  Name of family member notified:        PHYSICIAN       Additional Comment:    _______________________________________________ Dulcy Fanny, LCSW 05/30/2015, 1:21 PM

## 2015-05-30 NOTE — Care Management Important Message (Signed)
Important Message  Patient Details  Name: Jerry [name] MRN: [id number] Date of Birth: 05-01-1951   Medicare Important Message Given:  Yes-third notification given    Nathen May 05/30/2015, 10:25 AM

## 2015-05-30 NOTE — Progress Notes (Signed)
Physical Therapy Treatment Patient Details Name: Jerry [name] MRN: [id number] DOB: 1951/07/14 Today's Date: 05/30/2015    History of Present Illness Pt is a 64 y/o male who presents with a cartilaginous chest wall mass. Pt is now s/p a right anterior chest wall resection (3rd through 5th ribs), with reconstruction with polypropylene mesh and methyl methacrylate andwich on 05/06/15.   10/14 Plastics intervened, s/p Muscle and soft tissue advancement with placement of Flex HD 4 x 16 cm for closure of chest wall defect with Acell (500 mg) and VAC placement    PT Comments    Much improved tolerance to activity.  Improving stability  Ready for CIR.  Follow Up Recommendations  CIR     Equipment Recommendations  Rolling walker with 5" wheels;3in1 (PT)    Recommendations for Other Services       Precautions / Restrictions Precautions Precautions: Fall    Mobility  Bed Mobility               General bed mobility comments: up already  Transfers Overall transfer level: Needs assistance Equipment used: Rolling walker (2 wheeled) Transfers: Sit to/from Stand Sit to Stand: Min assist;Min guard (min from lower surfaces)            Ambulation/Gait Ambulation/Gait assistance: Min assist;+2 safety/equipment Ambulation Distance (Feet): 30 Feet Assistive device: Rolling walker (2 wheeled) Gait Pattern/deviations: Step-through pattern;Trunk flexed Gait velocity: Decreased Gait velocity interpretation: Below normal speed for age/gender General Gait Details: Gait stability improving, posture quite flexed, walker necessary for balance and activity tolerance   Stairs            Wheelchair Mobility    Modified Rankin (Stroke Patients Only)       Balance Overall balance assessment: Needs assistance Sitting-balance support: No upper extremity supported Sitting balance-Leahy Scale: Fair Sitting balance - Comments: sitting down to do various tasks, that involve  reaching outside BOS, to floor.  Working on w/shifting to get body in proper position.   Standing balance support: Bilateral upper extremity supported;Single extremity supported Standing balance-Leahy Scale: Poor Standing balance comment: standing 3-4 minutes working on standing tolerance, w/shifting and reaching in/out of BOS to complete extensive pericare and clean up activity. with 2 person safety                    Cognition Arousal/Alertness: Awake/alert Behavior During Therapy: WFL for tasks assessed/performed Overall Cognitive Status: Within Functional Limits for tasks assessed                      Exercises      General Comments General comments (skin integrity, edema, etc.): sats on RA during sitting and standing endurance activity stayed at mid 90% or above.Marland Kitchen  Until fatigued at approx 40 min hr stayed in the 90's bpm, but rose to 130's leading to rest.      Pertinent Vitals/Pain Pain Assessment: No/denies pain    Home Living                      Prior Function            PT Goals (current goals can now be found in the care plan section) Acute Rehab PT Goals Patient Stated Goal: independent again PT Goal Formulation: With patient/family Time For Goal Achievement: 06/07/15 Potential to Achieve Goals: Good Progress towards PT goals: Progressing toward goals    Frequency  Min 3X/week    PT Plan Current plan  remains appropriate    Co-evaluation PT/OT/SLP Co-Evaluation/Treatment: Yes Reason for Co-Treatment: For patient/therapist safety PT goals addressed during session: Mobility/safety with mobility       End of Session     Patient left: in chair;with call bell/phone within reach;with family/visitor present     Time: 1000-1043 PT Time Calculation (min) (ACUTE ONLY): 43 min  Charges:  $Therapeutic Activity: 8-22 mins                    G Codes:      [name], Tessie Fass 05/30/2015, 11:07 AM  05/30/2015  Donnella Sham,  PT [phone]  (pager)

## 2015-05-30 NOTE — Progress Notes (Signed)
Rehab admissions - I have sent all clinicals to [facility] requesting inpatient rehab admission.  I am waiting for a return call from case manager.  I expect to get a denial for inpatient rehab admission.  I will update all once I hear back from case manager.  Call me for questions.  #[phone]

## 2015-05-30 NOTE — Progress Notes (Signed)
TCTS BRIEF SICU PROGRESS NOTE  10 Days Post-Op  S/P Procedure(s) (LRB): Right CHEST WALL RECONSTRUCTION (N/A) TISSUE ADVANCEMENT OF CHEST WALL WITH PLACEMENT OF FLEX HD FOR RECONSTRUCTION (N/A)   Stable day  Plan: Continue current plan  Rexene Alberts, MD 05/30/2015 6:29 PM

## 2015-05-31 ENCOUNTER — Encounter (HOSPITAL_COMMUNITY): Payer: Self-pay | Admitting: *Deleted

## 2015-05-31 ENCOUNTER — Inpatient Hospital Stay (HOSPITAL_COMMUNITY)
Admission: RE | Admit: 2015-05-31 | Discharge: 2015-06-11 | DRG: 543 | Disposition: A | Discharge status: Home Health | Payer: Medicare HMO | Source: Intra-hospital | Attending: Physical Medicine & Rehabilitation | Admitting: Physical Medicine & Rehabilitation

## 2015-05-31 DIAGNOSIS — F17210 Nicotine dependence, cigarettes, uncomplicated: Secondary | ICD-10-CM | POA: Diagnosis present

## 2015-05-31 DIAGNOSIS — R600 Localized edema: Secondary | ICD-10-CM | POA: Diagnosis present

## 2015-05-31 DIAGNOSIS — I10 Essential (primary) hypertension: Secondary | ICD-10-CM | POA: Diagnosis present

## 2015-05-31 DIAGNOSIS — M961 Postlaminectomy syndrome, not elsewhere classified: Secondary | ICD-10-CM

## 2015-05-31 DIAGNOSIS — W19XXXA Unspecified fall, initial encounter: Secondary | ICD-10-CM | POA: Diagnosis not present

## 2015-05-31 DIAGNOSIS — Z9861 Coronary angioplasty status: Secondary | ICD-10-CM

## 2015-05-31 DIAGNOSIS — R222 Localized swelling, mass and lump, trunk: Secondary | ICD-10-CM

## 2015-05-31 DIAGNOSIS — Z9689 Presence of other specified functional implants: Secondary | ICD-10-CM | POA: Diagnosis present

## 2015-05-31 DIAGNOSIS — R5381 Other malaise: Secondary | ICD-10-CM | POA: Diagnosis present

## 2015-05-31 DIAGNOSIS — D62 Acute posthemorrhagic anemia: Secondary | ICD-10-CM | POA: Diagnosis present

## 2015-05-31 DIAGNOSIS — I2510 Atherosclerotic heart disease of native coronary artery without angina pectoris: Secondary | ICD-10-CM | POA: Diagnosis present

## 2015-05-31 DIAGNOSIS — R609 Edema, unspecified: Secondary | ICD-10-CM | POA: Diagnosis not present

## 2015-05-31 DIAGNOSIS — E876 Hypokalemia: Secondary | ICD-10-CM | POA: Diagnosis not present

## 2015-05-31 DIAGNOSIS — C419 Malignant neoplasm of bone and articular cartilage, unspecified: Principal | ICD-10-CM | POA: Diagnosis present

## 2015-05-31 DIAGNOSIS — G894 Chronic pain syndrome: Secondary | ICD-10-CM | POA: Diagnosis present

## 2015-05-31 DIAGNOSIS — R350 Frequency of micturition: Secondary | ICD-10-CM | POA: Diagnosis not present

## 2015-05-31 DIAGNOSIS — K5900 Constipation, unspecified: Secondary | ICD-10-CM | POA: Diagnosis present

## 2015-05-31 DIAGNOSIS — R339 Retention of urine, unspecified: Secondary | ICD-10-CM | POA: Diagnosis present

## 2015-05-31 LAB — CBC WITH DIFFERENTIAL/PLATELET
Basophils Absolute: 0 10*3/uL (ref 0.0–0.1)
Basophils Relative: 0 %
EOS ABS: 0.3 10*3/uL (ref 0.0–0.7)
Eosinophils Relative: 4 %
HEMATOCRIT: 33.7 % — AB (ref 39.0–52.0)
HEMOGLOBIN: 10.5 g/dL — AB (ref 13.0–17.0)
LYMPHS ABS: 2.4 10*3/uL (ref 0.7–4.0)
LYMPHS PCT: 32 %
MCH: 28.1 pg (ref 26.0–34.0)
MCHC: 31.2 g/dL (ref 30.0–36.0)
MCV: 90.1 fL (ref 78.0–100.0)
MONOS PCT: 8 %
Monocytes Absolute: 0.6 10*3/uL (ref 0.1–1.0)
NEUTROS ABS: 4 10*3/uL (ref 1.7–7.7)
NEUTROS PCT: 56 %
Platelets: 372 10*3/uL (ref 150–400)
RBC: 3.74 MIL/uL — AB (ref 4.22–5.81)
RDW: 14.4 % (ref 11.5–15.5)
WBC: 7.3 10*3/uL (ref 4.0–10.5)

## 2015-05-31 LAB — COMPREHENSIVE METABOLIC PANEL
ALT: 19 U/L (ref 17–63)
ANION GAP: 10 (ref 5–15)
AST: 30 U/L (ref 15–41)
Albumin: 2.8 g/dL — ABNORMAL LOW (ref 3.5–5.0)
Alkaline Phosphatase: 87 U/L (ref 38–126)
BUN: 6 mg/dL (ref 6–20)
CHLORIDE: 95 mmol/L — AB (ref 101–111)
CO2: 32 mmol/L (ref 22–32)
CREATININE: 0.91 mg/dL (ref 0.61–1.24)
Calcium: 8.9 mg/dL (ref 8.9–10.3)
Glucose, Bld: 135 mg/dL — ABNORMAL HIGH (ref 65–99)
POTASSIUM: 3.7 mmol/L (ref 3.5–5.1)
SODIUM: 137 mmol/L (ref 135–145)
Total Bilirubin: 0.6 mg/dL (ref 0.3–1.2)
Total Protein: 7.4 g/dL (ref 6.5–8.1)

## 2015-05-31 MED ORDER — ALBUTEROL SULFATE (2.5 MG/3ML) 0.083% IN NEBU: 2.5000 mg | INHALATION_SOLUTION | Freq: Four times a day (QID) | RESPIRATORY_TRACT | Status: DC | PRN

## 2015-05-31 MED ORDER — PANTOPRAZOLE SODIUM 40 MG PO TBEC
40.0000 mg | DELAYED_RELEASE_TABLET | Freq: Two times a day (BID) | ORAL | Status: DC
  Administered 2015-05-31 – 2015-06-11 (×22): 40 mg via ORAL
  Filled 2015-05-31 (×22): qty 1

## 2015-05-31 MED ORDER — ENOXAPARIN SODIUM 40 MG/0.4ML ~~LOC~~ SOLN
40.0000 mg | SUBCUTANEOUS | Status: DC
Start: 2015-06-01 — End: 2015-06-11
  Administered 2015-06-01 – 2015-06-11 (×11): 40 mg via SUBCUTANEOUS
  Filled 2015-05-31 (×11): qty 0.4

## 2015-05-31 MED ORDER — OXYCODONE HCL 5 MG PO TABS
15.0000 mg | ORAL_TABLET | ORAL | Status: DC | PRN
Start: 2015-05-31 — End: 2015-05-31

## 2015-05-31 MED ORDER — DOCUSATE SODIUM 100 MG PO CAPS: 100.0000 mg | ORAL_CAPSULE | Freq: Two times a day (BID) | ORAL | Status: DC | PRN

## 2015-05-31 MED ORDER — ACETAMINOPHEN 325 MG PO TABS
325.0000 mg | ORAL_TABLET | ORAL | Status: DC | PRN
  Administered 2015-06-05 – 2015-06-11 (×4): 650 mg via ORAL
  Filled 2015-05-31 (×5): qty 2

## 2015-05-31 MED ORDER — DIAZEPAM 5 MG PO TABS: 10.0000 mg | ORAL_TABLET | Freq: Two times a day (BID) | ORAL | Status: DC | PRN

## 2015-05-31 MED ORDER — PRO-STAT SUGAR FREE PO LIQD: 30.0000 mL | Freq: Two times a day (BID) | ORAL | Status: DC

## 2015-05-31 MED ORDER — PANTOPRAZOLE SODIUM 40 MG PO TBEC: 40.0000 mg | DELAYED_RELEASE_TABLET | Freq: Two times a day (BID) | ORAL | Status: DC

## 2015-05-31 MED ORDER — OXYCODONE HCL 5 MG PO TABS
15.0000 mg | ORAL_TABLET | ORAL | Status: DC | PRN
  Filled 2015-05-31: qty 6

## 2015-05-31 MED ORDER — POTASSIUM CHLORIDE CRYS ER 20 MEQ PO TBCR
40.0000 meq | EXTENDED_RELEASE_TABLET | Freq: Two times a day (BID) | ORAL | Status: DC
  Administered 2015-05-31 – 2015-06-09 (×19): 40 meq via ORAL
  Filled 2015-05-31 (×19): qty 2

## 2015-05-31 MED ORDER — ONDANSETRON HCL 4 MG PO TABS: 4.0000 mg | ORAL_TABLET | Freq: Four times a day (QID) | ORAL | Status: DC | PRN

## 2015-05-31 MED ORDER — ONDANSETRON HCL 4 MG/2ML IJ SOLN: 4.0000 mg | Freq: Four times a day (QID) | INTRAMUSCULAR | Status: DC | PRN

## 2015-05-31 MED ORDER — BUDESONIDE-FORMOTEROL FUMARATE 160-4.5 MCG/ACT IN AERO
2.0000 | INHALATION_SPRAY | Freq: Every day | RESPIRATORY_TRACT | Status: DC
  Administered 2015-06-01 – 2015-06-11 (×6): 2 via RESPIRATORY_TRACT
  Filled 2015-05-31: qty 6

## 2015-05-31 MED ORDER — BUDESONIDE-FORMOTEROL FUMARATE 160-4.5 MCG/ACT IN AERO
2.0000 | INHALATION_SPRAY | Freq: Every day | RESPIRATORY_TRACT | Status: DC
  Filled 2015-05-31: qty 6

## 2015-05-31 MED ORDER — ENOXAPARIN SODIUM 40 MG/0.4ML ~~LOC~~ SOLN: 40.0000 mg | SUBCUTANEOUS | Status: DC

## 2015-05-31 MED ORDER — BISACODYL 10 MG RE SUPP
10.0000 mg | Freq: Every day | RECTAL | Status: DC | PRN
  Administered 2015-06-03 – 2015-06-10 (×4): 10 mg via RECTAL
  Filled 2015-05-31 (×5): qty 1

## 2015-05-31 MED ORDER — ENSURE ENLIVE PO LIQD
237.0000 mL | Freq: Two times a day (BID) | ORAL | Status: DC
  Administered 2015-06-08 (×2): 237 mL via ORAL

## 2015-05-31 MED ORDER — FUROSEMIDE 40 MG PO TABS
40.0000 mg | ORAL_TABLET | Freq: Every day | ORAL | Status: DC
  Administered 2015-06-01 – 2015-06-11 (×11): 40 mg via ORAL
  Filled 2015-05-31 (×11): qty 1

## 2015-05-31 MED ORDER — TAMSULOSIN HCL 0.4 MG PO CAPS
0.4000 mg | ORAL_CAPSULE | Freq: Every day | ORAL | Status: DC
  Administered 2015-05-31: 0.4 mg via ORAL
  Filled 2015-05-31: qty 1

## 2015-05-31 MED ORDER — TAMSULOSIN HCL 0.4 MG PO CAPS
0.4000 mg | ORAL_CAPSULE | Freq: Every day | ORAL | Status: DC
  Administered 2015-05-31 – 2015-06-10 (×11): 0.4 mg via ORAL
  Filled 2015-05-31 (×11): qty 1

## 2015-05-31 MED ORDER — SORBITOL 70 % SOLN: 30.0000 mL | Freq: Every day | Status: DC | PRN

## 2015-05-31 MED ORDER — DIAZEPAM 5 MG PO TABS
10.0000 mg | ORAL_TABLET | Freq: Two times a day (BID) | ORAL | Status: DC | PRN
  Administered 2015-06-01 – 2015-06-11 (×10): 10 mg via ORAL
  Filled 2015-05-31 (×10): qty 2

## 2015-05-31 MED ORDER — LACTULOSE 10 GM/15ML PO SOLN
10.0000 g | Freq: Every day | ORAL | Status: DC | PRN
  Filled 2015-05-31: qty 15

## 2015-05-31 MED ORDER — BISACODYL 10 MG RE SUPP: 10.0000 mg | Freq: Every day | RECTAL | Status: DC | PRN

## 2015-05-31 MED ORDER — DIAZEPAM 5 MG PO TABS
10.0000 mg | ORAL_TABLET | Freq: Two times a day (BID) | ORAL | Status: DC | PRN
  Administered 2015-05-31: 10 mg via ORAL
  Filled 2015-05-31: qty 2

## 2015-05-31 MED ORDER — CETYLPYRIDINIUM CHLORIDE 0.05 % MT LIQD
7.0000 mL | Freq: Two times a day (BID) | OROMUCOSAL | Status: DC
  Administered 2015-06-01 – 2015-06-09 (×14): 7 mL via OROMUCOSAL

## 2015-05-31 MED ORDER — FUROSEMIDE 40 MG PO TABS: 40.0000 mg | ORAL_TABLET | Freq: Every day | ORAL | Status: DC

## 2015-05-31 MED ORDER — BISACODYL 5 MG PO TBEC
10.0000 mg | DELAYED_RELEASE_TABLET | Freq: Every morning | ORAL | Status: DC
  Administered 2015-06-01: 10 mg via ORAL
  Filled 2015-05-31: qty 2

## 2015-05-31 MED ORDER — SORBITOL 70 % SOLN
30.0000 mL | Freq: Every day | Status: DC | PRN
  Filled 2015-05-31: qty 30

## 2015-05-31 MED ORDER — ENSURE ENLIVE PO LIQD: 237.0000 mL | Freq: Two times a day (BID) | ORAL | Status: DC

## 2015-05-31 MED ORDER — LACTULOSE 10 GM/15ML PO SOLN: 10.0000 g | Freq: Every day | ORAL | Status: DC | PRN

## 2015-05-31 NOTE — Progress Notes (Signed)
Pt arrived to floor aprox 1730 from [place] patient to rehab routine. Safety plan discussed and pt verbalized agreement. Call bell placed within reach. No further questions at this time. Kenia Teagarden, Dione Plover
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
## 2015-05-31 NOTE — Progress Notes (Signed)
Nursing note Pt arrived from [facility], Placed on telemetry and called to CCMD pt on monitor, VSS on room air, pt with Abdominal binder on and drains underneath clean dry intact. Will continue to monitor patient. Gerome Kokesh, Bettina Gavia RN

## 2015-05-31 NOTE — Care Management Note (Addendum)
Case Management Note CM note started by [place]  Patient Details  Name: Jerry [name] MRN: [id number] Date of Birth: 1950-10-16  Subjective/Objective:   Pt lives with son who plans to take FMLA x 1 week to provide assistance when pt medically stable for discharge.  Pt states he also has 4 daughters that will help.  PT recommends CIR, pt will need OT eval for insurance purposes.    Pt states he once had a rehab stay @ [facility] and does not want to transfer to SNF, agrees to consider CIR.                           Expected Discharge Plan:  [place]  Discharge planning Services  CM Consult  Status of Service:  Completed, signed off  Medicare Important Message Given:  Yes-third notification given          Expected Discharge Date:    05/31/15              Expected Discharge Plan:  [place]  In-House Referral:     Discharge planning Services  CM Consult  Post Acute Care Choice:    Choice offered to:     DME Arranged:    DME Agency:     HH Arranged:    [place] Agency:     Status of Service:  Completed, signed off  Medicare Important Message Given:  Yes-third notification given Date Medicare IM Given:    Medicare IM give by:    Date Additional Medicare IM Given:    Additional Medicare Important Message give by:     If discussed at [place] of Stay Meetings, dates discussed:    Additional Comments:  05/31/15- per Genie with CIR- they have a bed today and insurance approval and can take pt later this afternoon, plan to d/c pt to CIR today  Dawayne Patricia, RN 05/31/2015, 11:51 AM

## 2015-05-31 NOTE — Progress Notes (Signed)
11 Days Post-Op Procedure(s) (LRB): Right CHEST WALL RECONSTRUCTION (N/A) TISSUE ADVANCEMENT OF CHEST WALL WITH PLACEMENT OF FLEX HD FOR RECONSTRUCTION (N/A) Subjective: Feeling better Pain well controlled   Objective: Vital signs in last 24 hours: Temp:  [98.6 F (37 C)-99.1 F (37.3 C)] 99.1 F (37.3 C) (10/24 1912) Pulse Rate:  [51-108] 51 (10/25 0600) Cardiac Rhythm:  [-] Normal sinus rhythm (10/25 0745) Resp:  [16-21] 17 (10/25 0600) BP: (110-136)/(43-61) 121/50 mmHg (10/25 0600) SpO2:  [87 %-99 %] 96 % (10/25 0600)  Hemodynamic parameters for last 24 hours:    Intake/Output from previous day: 10/24 0701 - 10/25 0700 In: 1440 [P.O.:1440] Out: 2395 [Urine:2250; Drains:145] Intake/Output this shift:    General appearance: alert, cooperative and no distress Neurologic: intact Heart: regular rate and rhythm Lungs: diminished breath sounds bibasilar Abdomen: normal findings: soft, non-tender Wound: clean and dry, drain sites OK  Lab Results:  Recent Labs  05/29/15 0415 05/30/15 0500  WBC 7.3 7.5  HGB 9.2* 9.8*  HCT 30.4* 31.2*  PLT 337 345   BMET:  Recent Labs  05/29/15 0415 05/30/15 0500  NA 136 138  K 2.9* 3.4*  CL 94* 94*  CO2 35* 35*  GLUCOSE 111* 101*  BUN 5* <5*  CREATININE 0.78 0.79  CALCIUM 8.4* 8.6*    PT/INR: No results for input(s): LABPROT, INR in the last 72 hours. ABG    Component Value Date/Time   PHART 7.367 05/22/2015 0953   HCO3 27.9* 05/22/2015 0953   TCO2 29 05/22/2015 0953   O2SAT 94.0 05/22/2015 0953   CBG (last 3)  No results for input(s): GLUCAP in the last 72 hours.  Assessment/Plan: S/P Procedure(s) (LRB): Right CHEST WALL RECONSTRUCTION (N/A) TISSUE ADVANCEMENT OF CHEST WALL WITH PLACEMENT OF FLEX HD FOR RECONSTRUCTION (N/A) Plan for transfer to step-down: see transfer orders   Continues to progress Will transfer to [facility] Continue PT/OT Will need SNF if not able to do inpatient rehab   LOS: 26 days     [name] 05/31/2015

## 2015-05-31 NOTE — Progress Notes (Signed)
Report called to 23M/4W. Camellia Popescu, Bettina Gavia RN

## 2015-05-31 NOTE — Progress Notes (Signed)
Patient was safely transferred to [facility] [place], Room 16 and the report was given to the nurse. Portable monitor was on and no changes.
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
## 2015-05-31 NOTE — Progress Notes (Signed)
Rehab admissions - I have approval for acute inpatient rehab admission for today.  Bed available on inpatient rehab and will admit today.  Call me for questions.  #[phone]

## 2015-06-01 ENCOUNTER — Inpatient Hospital Stay (HOSPITAL_COMMUNITY): Payer: Medicare HMO | Admitting: Occupational Therapy

## 2015-06-01 ENCOUNTER — Inpatient Hospital Stay (HOSPITAL_COMMUNITY): Payer: Medicare HMO | Admitting: Physical Therapy

## 2015-06-01 DIAGNOSIS — C419 Malignant neoplasm of bone and articular cartilage, unspecified: Principal | ICD-10-CM

## 2015-06-01 DIAGNOSIS — R609 Edema, unspecified: Secondary | ICD-10-CM

## 2015-06-01 DIAGNOSIS — M961 Postlaminectomy syndrome, not elsewhere classified: Secondary | ICD-10-CM | POA: Diagnosis present

## 2015-06-01 DIAGNOSIS — R5381 Other malaise: Secondary | ICD-10-CM

## 2015-06-01 LAB — CBC WITH DIFFERENTIAL/PLATELET
BASOS ABS: 0 10*3/uL (ref 0.0–0.1)
BASOS PCT: 1 %
EOS ABS: 0.5 10*3/uL (ref 0.0–0.7)
EOS PCT: 7 %
HCT: 29.1 % — ABNORMAL LOW (ref 39.0–52.0)
HEMOGLOBIN: 9.1 g/dL — AB (ref 13.0–17.0)
LYMPHS ABS: 3.2 10*3/uL (ref 0.7–4.0)
Lymphocytes Relative: 50 %
MCH: 28.6 pg (ref 26.0–34.0)
MCHC: 31.3 g/dL (ref 30.0–36.0)
MCV: 91.5 fL (ref 78.0–100.0)
Monocytes Absolute: 0.6 10*3/uL (ref 0.1–1.0)
Monocytes Relative: 9 %
NEUTROS PCT: 33 %
Neutro Abs: 2.1 10*3/uL (ref 1.7–7.7)
PLATELETS: 259 10*3/uL (ref 150–400)
RBC: 3.18 MIL/uL — AB (ref 4.22–5.81)
RDW: 14.6 % (ref 11.5–15.5)
WBC: 6.3 10*3/uL (ref 4.0–10.5)

## 2015-06-01 LAB — COMPREHENSIVE METABOLIC PANEL
ALBUMIN: 2.4 g/dL — AB (ref 3.5–5.0)
ALK PHOS: 77 U/L (ref 38–126)
ALT: 17 U/L (ref 17–63)
AST: 22 U/L (ref 15–41)
Anion gap: 10 (ref 5–15)
BUN: 7 mg/dL (ref 6–20)
CALCIUM: 8.2 mg/dL — AB (ref 8.9–10.3)
CHLORIDE: 94 mmol/L — AB (ref 101–111)
CO2: 30 mmol/L (ref 22–32)
CREATININE: 0.9 mg/dL (ref 0.61–1.24)
GFR calc Af Amer: >60 mL/min (ref >60)
GFR calc non Af Amer: >60 mL/min (ref >60)
GLUCOSE: 95 mg/dL (ref 65–99)
Potassium: 3.4 mmol/L — ABNORMAL LOW (ref 3.5–5.1)
SODIUM: 134 mmol/L — AB (ref 135–145)
Total Bilirubin: 0.5 mg/dL (ref 0.3–1.2)
Total Protein: 6.1 g/dL — ABNORMAL LOW (ref 6.5–8.1)
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
## 2015-06-01 NOTE — Interval H&P Note (Signed)
Jerry [name] was admitted today to Inpatient Rehabilitation with the diagnosis of deconditioning following resection of chondrosarcoma.  The patient's history has been reviewed, patient examined, and there is no change in status.  Patient continues to be appropriate for intensive inpatient rehabilitation.  I have reviewed the patient's chart and labs.  Questions were answered to the patient's satisfaction. The PAPE has been reviewed and assessment remains appropriate.  Charlett Blake 06/01/2015, 8:38 AM

## 2015-06-01 NOTE — Progress Notes (Signed)
Patient information reviewed and entered into eRehab system by Tyquasia Pant, RN, CRRN, PPS Coordinator.  Information including medical coding and functional independence measure will be reviewed and updated through discharge.     Per nursing patient was given "Data Collection Information Summary for Patients in Inpatient Rehabilitation Facilities with attached "Privacy Act Statement-Health Care Records" upon admission.  
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
## 2015-06-01 NOTE — Care Management Note (Signed)
[facility] Individual Statement of Services  Patient Name:  Jerry [name]  Date:  06/01/2015  Welcome to the [facility].  Our goal is to provide you with an individualized program based on your diagnosis and situation, designed to meet your specific needs.  With this comprehensive rehabilitation program, you will be expected to participate in at least 3 hours of rehabilitation therapies Monday-Friday, with modified therapy programming on the weekends.  Your rehabilitation program will include the following services:  Physical Therapy (PT), Occupational Therapy (OT), 24 hour per day rehabilitation nursing, Therapeutic Recreaction (TR), Neuropsychology, Case Management (Social Worker), Rehabilitation Medicine, Nutrition Services and Pharmacy Services  Weekly team conferences will be held on Wednesday to discuss your progress.  Your Social Worker will talk with you frequently to get your input and to update you on team discussions.  Team conferences with you and your family in attendance may also be held.  Expected length of stay: 8-12  days  Overall anticipated outcome: mod/i level  Depending on your progress and recovery, your program may change. Your Social Worker will coordinate services and will keep you informed of any changes. Your Social Worker's name and contact numbers are listed  below.  The following services may also be recommended but are not provided by the [facility] will be made to provide these services after discharge if needed.  Arrangements include referral to agencies that provide these services.  Your insurance has been verified to be:  [employer] Your primary doctor is:  [facility]  Pertinent information will be shared with your doctor and your insurance company.  Social Worker:   Ovidio Kin, [place] or ([phone]  Information discussed with and copy given to patient by: Elease Hashimoto, 06/01/2015, 10:00 AM

## 2015-06-01 NOTE — Progress Notes (Signed)
Physical Therapy Session Note  Patient Details  Name: Jerry [name] MRN: [id number] Date of Birth: September 23, 1950  Today's Date: 06/01/2015 PT Individual Time: 7035-0093 PT Individual Time Calculation (min): 35 min   Short Term Goals: Week 1:   =LTG due to estimated length of stay  Skilled Therapeutic Interventions/Progress Updates:  Pt received seated in recliner; no c/o pain and agreeable to treatment. Pt does have R sided back muscle spasm while trying to get out of recliner however subsided after approximately 1 min, did not rate severity. Pt reports fatigue from sessions earlier in the day and states he may have been able to walk to the gym earlier in the day but cannot now because he is so tired. Stand pivot recliner<>w/c at beginning and end of session with close supervision using armrests and no AD per pt request. Pt transported to gym [name] for time management and energy conservation. Stand pivot w/c >nustep with close supervision, minA on transfer nustep>w/c due to noted minor LOB however pt able to recover with UE support on w/c and with stepping strategies. Nustep x78min with LUE/BLE and RLE lightly touching handle due to reported surgical discomfort. Pt remained seated in recliner at completion of session with all needs in reach; educated pt on use of call bell system for assistance getting up and pt agreeable.   Therapy Documentation Precautions:  Precautions Precautions: None Precaution Comments: 2 drains and VAC Required Braces or Orthoses: Other Brace/Splint Other Brace/Splint: abdominal binder Restrictions Weight Bearing Restrictions: No Pain: Pain Assessment Pain Assessment: No/denies pain Pain Score: 0-No pain   See Function Navigator for Current Functional Status.   Therapy/Group: Individual Therapy  Luberta Mutter 06/01/2015, 3:42 PM

## 2015-06-01 NOTE — Progress Notes (Signed)
S/P chest wall reconstruction with local tissue advancement and placement of Flex HD- POD #12 Subjective: Patient sitting up eating lunch. Reports increased drainage from JP drains last couple of days.  Reports mobilizing more now that he is on rehab.  Drainage is clear serosanguinous drainage.   Objective: Vital signs in last 24 hours: Temp:  [98.2 F (36.8 C)-98.3 F (36.8 C)] 98.3 F (36.8 C) (10/26 0624) Pulse Rate:  [67-80] 72 (10/26 1100) Resp:  [18] 18 (10/26 1100) BP: (126-136)/(55-63) 126/63 mmHg (10/26 0624) SpO2:  [91 %-93 %] 91 % (10/26 1100) Weight:  [111.857 kg (246 lb 9.6 oz)] 111.857 kg (246 lb 9.6 oz) (10/25 1818) Last BM Date: 05/30/15  Intake/Output from previous day: 10/25 0701 - 10/26 0700 In: 360 [P.O.:360] Out: 580 [Urine:550; Drains:30] Intake/Output this shift:     Lab Results:   Recent Labs  05/31/15 1230 06/01/15 0527  WBC 7.3 6.3  HGB 10.5* 9.1*  HCT 33.7* 29.1*  PLT 372 259   BMET  Recent Labs  05/31/15 1230 06/01/15 0527  NA 137 134*  K 3.7 3.4*  CL 95* 94*  CO2 32 30  GLUCOSE 135* 95  BUN 6 7  CREATININE 0.91 0.90  CALCIUM 8.9 8.2*   PT/INR No results for input(s): LABPROT, INR in the last 72 hours. ABG No results for input(s): PHART, HCO3 in the last 72 hours.  Invalid input(s): PCO2, PO2  Studies/Results: No results found.  Anti-infectives: Anti-infectives    None      Assessment/Plan: S/P chest wall reconstruction with local tissue advancement and placement of Flex HD- POD #12 Increased JP drainage following increase in activity. Will monitor drain OP    LOS: 1 day    Toy Eisemann,PA-C Plastic Surgery [phone]

## 2015-06-02 ENCOUNTER — Inpatient Hospital Stay (HOSPITAL_COMMUNITY): Payer: Medicare HMO | Admitting: Occupational Therapy

## 2015-06-02 ENCOUNTER — Inpatient Hospital Stay (HOSPITAL_COMMUNITY): Payer: Medicare HMO

## 2015-06-02 ENCOUNTER — Inpatient Hospital Stay (HOSPITAL_COMMUNITY): Payer: Medicare HMO | Admitting: Physical Therapy

## 2015-06-02 DIAGNOSIS — M961 Postlaminectomy syndrome, not elsewhere classified: Secondary | ICD-10-CM

## 2015-06-02 DIAGNOSIS — R222 Localized swelling, mass and lump, trunk: Secondary | ICD-10-CM

## 2015-06-02 MED ORDER — SENNOSIDES-DOCUSATE SODIUM 8.6-50 MG PO TABS
2.0000 | ORAL_TABLET | Freq: Two times a day (BID) | ORAL | Status: DC
  Administered 2015-06-02 – 2015-06-11 (×19): 2 via ORAL
  Filled 2015-06-02 (×19): qty 2

## 2015-06-02 NOTE — Progress Notes (Signed)
Physical Therapy Session Note  Patient Details  Name: Jerry [name] MRN: [id number] Date of Birth: 1951/03/20  Today's Date: 06/02/2015 PT Individual Time: 1300-1330 PT Individual Time Calculation (min): 30 min   Short Term Goals: Week 1:  PT Short Term Goal 1 (Week 1): = LTG due to short LOS  Skilled Therapeutic Interventions/Progress Updates:    Patient seen for 1:1 treatment focus of treatment on gait and standing endurance.  Stood from recliner in room to ambulate 110' with RW supervision.  Then performed w/c mobility about 28' to therapy gym.  Stood for standing endurance activity x 6 min 10 sec while playing checkers.   Patient reports limited by back pain.  Gait x another 40' supervision with RW.  SPT back to recliner in room with RW supervision.  Left with all needs within reach.  Therapy Documentation Precautions:  Precautions Precautions: Fall Precaution Comments: 2 drains and VAC; significant LE edema-keep elevated; HOB >45 deg Required Braces or Orthoses: Other Brace/Splint Other Brace/Splint: abdominal binder Restrictions Weight Bearing Restrictions: No  Pain: Pain Assessment Pain Assessment: No/denies pain Pain Score: 0-No pain   See Function Navigator for Current Functional Status.   Therapy/Group: Individual Therapy  Bergen, Onton 06/02/2015  06/02/2015, 2:51 PM

## 2015-06-02 NOTE — Progress Notes (Signed)
Physical Therapy Session Note  Patient Details  Name: Jerry [name] MRN: [id number] Date of Birth: 1951-01-12  Today's Date: 06/02/2015 PT Individual Time: 1100-1200 PT Individual Time Calculation (min): 60 min   Short Term Goals: Week 1:  PT Short Term Goal 1 (Week 1): = LTG due to short LOS  Skilled Therapeutic Interventions/Progress Updates:    Pt received seated in recliner with son present; no c/o pain and agreeable to treatment. Sit >stand with close supervision, arm rests and RW. Gait with RW x90' with close supervision. Occasional cues for RW management closer to body for safety and incr stability. Performed seated LE strengthening exercises and pt given handouts with pictures/instructrions for exercises to perform in room outside of regular therapy time. Attempted supine lying to perform additional LE exercises however pt unable to tolerate position due to back pain. Sit <>stand 2 sets 10 reps with no UEs. Nustep x12 min with BUE/BLE with average 30 steps/min. Supervision stand pivot w/c <> nustep and w/c >recliner at completion of session. Remained seated in recliner with LEs elevated with all needs within reach.   Therapy Documentation Precautions:  Precautions Precautions: Fall Precaution Comments: 2 drains and VAC; significant LE edema-keep elevated; HOB >45 deg Required Braces or Orthoses: Other Brace/Splint Other Brace/Splint: abdominal binder Restrictions Weight Bearing Restrictions: No Pain: Pain Assessment Pain Assessment: No/denies pain Pain Score: 0-No pain   See Function Navigator for Current Functional Status.   Therapy/Group: Individual Therapy  Luberta Mutter 06/02/2015, 12:20 PM

## 2015-06-02 NOTE — Op Note (Signed)
NAMEDAVYON, FISCH              ACCOUNT NO.:  [id number]  MEDICAL RECORD NO.:  [id number]  LOCATION:                                 FACILITY:  PHYSICIAN:  Revonda Standard. Naisha Wisdom, M.D.DATE OF BIRTH:  [birth year]  DATE OF PROCEDURE:  05/20/2015 DATE OF DISCHARGE:                              OPERATIVE REPORT   PREOPERATIVE DIAGNOSIS:  Right chest wall defect status post resection of right chest wall mass.  POSTOPERATIVE DIAGNOSIS:  Right chest wall defect status post resection of right chest wall mass.  PROCEDURE:  Right chest wall reconstruction with polypropylene mesh- methyl methacrylate prosthesis.  SURGEON:  Revonda Standard. Roxan Hockey, M.D.  ASSISTANT:  John Giovanni, PA-C.  ANESTHESIA:  General.  FINDINGS:  Wound clean.  Tissue would not advance and unable to be closed primarily.  Dr. Marla Roe performed closure with tissue advancement and flex HD.  CLINICAL NOTE:  Mr. 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  A polypropylene mesh- methylmethacrylate prosthesis had been placed.  A few days postoperatively, he began to have bleeding and ultimately had to be taken back to the operating room for bleeding.  The chest wall prosthesis was removed and a VAC was placed.  The VAC had subsequently been changed and the wound was clean.  The patient was now felt ready for possible chest wall reconstruction.  Dr. Marla Roe of Plastic Surgery would assist with closure.  The indications, risks, benefits, and alternatives were discussed with the patient's family.  They understood the risks and gave permission to proceed.  OPERATIVE NOTE:  Mr. Dushawn was brought to the operating room on May 20, 2015.  He was already intubated.  General anesthesia was induced. The single-lumen endotracheal tube was switched out for a double-lumen endotracheal tube.  The VAC dressing and superficial VAC sponge were removed as  well.  The chest and abdomen were prepped and draped in the usual sterile fashion.  The remainder of the VAC sponges were removed.  The wound was clean.  There was no sign of infection.  The decision was made to proceed with reconstruction as planned. A 28-French Blake drain was placed before placing the prosthesis.  Two 15 x 15 cm sheaths of polypropylene mesh were cut to fit the shape of the defect.  A rigid prosthesis then was created by placing methylmethacrylate paste between the two layers of mesh and conforming them to match the curvature of the chest wall.  The prosthesis was allowed to harden.  Holes were then drilled in the prosthesis to match up to the sites where sutures would be placed.  The prosthesis then was placed using #1 Prolene sutures.  The attachments were to the sternum medially, the second rib superiorly, the sixth rib inferiorly, and the ends of the third, fourth, and fifth ribs posteriorly.  After the sutures had all been tied  and the prosthesis was secured, it was copiously irrigated with warm saline.  Dr. Marla Roe from Plastic Surgery then performed the closure using flex HD and tissue advancement. The skin was closed primarily.  Mr. Sievers was reintubated with a single-lumen endotracheal tube and  taken back to the surgical intensive care unit in good condition.     Revonda Standard Roxan Hockey, M.D.     SCH/MEDQ  D:  06/01/2015  T:  06/02/2015  Job:  [id number]

## 2015-06-02 NOTE — Progress Notes (Signed)
Subjective/Complaints: Pt had good day in therapy States he's lost ~50lb and this has helped his back pain and reduced po narcotics, still has intrathecal pain pump  Objective: Vital Signs: Blood pressure 108/68, pulse 60, temperature 98.1 F (36.7 C), temperature source Oral, resp. rate 16, height 6' (1.829 m), weight 111.857 kg (246 lb 9.6 oz), SpO2 95 %. No results found. Results for orders placed or performed during the hospital encounter of 05/31/15 (from the past 72 hour(s))  CBC WITH DIFFERENTIAL     Status: Abnormal   Collection Time: 06/01/15  5:27 AM  Result Value Ref Range   WBC 6.3 4.0 - 10.5 K/uL   RBC 3.18 (L) 4.22 - 5.81 MIL/uL   Hemoglobin 9.1 (L) 13.0 - 17.0 g/dL   HCT 29.1 (L) 39.0 - 52.0 %   MCV 91.5 78.0 - 100.0 fL   MCH 28.6 26.0 - 34.0 pg   MCHC 31.3 30.0 - 36.0 g/dL   RDW 14.6 11.5 - 15.5 %   Platelets 259 150 - 400 K/uL   Neutrophils Relative % 33 %   Neutro Abs 2.1 1.7 - 7.7 K/uL   Lymphocytes Relative 50 %   Lymphs Abs 3.2 0.7 - 4.0 K/uL   Monocytes Relative 9 %   Monocytes Absolute 0.6 0.1 - 1.0 K/uL   Eosinophils Relative 7 %   Eosinophils Absolute 0.5 0.0 - 0.7 K/uL   Basophils Relative 1 %   Basophils Absolute 0.0 0.0 - 0.1 K/uL  Comprehensive metabolic panel     Status: Abnormal   Collection Time: 06/01/15  5:27 AM  Result Value Ref Range   Sodium 134 (L) 135 - 145 mmol/L   Potassium 3.4 (L) 3.5 - 5.1 mmol/L   Chloride 94 (L) 101 - 111 mmol/L   CO2 30 22 - 32 mmol/L   Glucose, Bld 95 65 - 99 mg/dL   BUN 7 6 - 20 mg/dL   Creatinine, Ser 0.90 0.61 - 1.24 mg/dL   Calcium 8.2 (L) 8.9 - 10.3 mg/dL   Total Protein 6.1 (L) 6.5 - 8.1 g/dL   Albumin 2.4 (L) 3.5 - 5.0 g/dL   AST 22 15 - 41 U/L   ALT 17 17 - 63 U/L   Alkaline Phosphatase 77 38 - 126 U/L   Total Bilirubin 0.5 0.3 - 1.2 mg/dL   GFR calc non Af Amer >60 >60 mL/min   GFR calc Af Amer >60 >60 mL/min    Comment: (NOTE) The eGFR has been calculated using the CKD EPI equation. This  calculation has not been validated in all clinical situations. eGFR's persistently <60 mL/min signify possible Chronic Kidney Disease.    Anion gap 10 5 - 15     HEENT: normal Cardio: RRR Resp: CTA B/L and unlabored GI: BS positive and NT, ND Extremity:  Edema 2-3+ Bilateral pretib Skin:   Wound Right chest drain sites, midline incision healing well Neuro: Alert/Oriented, Normal Sensory and Abnormal Motor 4/5 bilateral delt, bi, tri, grip, HF, KE, ADF Musc/Skel:  LB tender and Other no pain with UE or LE AROM Gen NAD   Assessment/Plan: 1. 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 Physiatrist is providing close team supervision and 24 hour management of active medical problems listed below. Physiatrist and rehab team continue to assess barriers to discharge/monitor patient progress toward functional and medical goals. FIM: Function - Bathing Position: Sitting  EOB Body parts bathed by patient: Right arm, Left arm, Front perineal area, Buttocks, Right upper leg, Left upper leg, Chest Body parts bathed by helper: Back, Left lower leg, Right lower leg Bathing not applicable: Abdomen  Function- Upper Body Dressing/Undressing What is the patient wearing?: Pull over shirt/dress Pull over shirt/dress - Perfomed by patient: Thread/unthread right sleeve, Thread/unthread left sleeve, Put head through opening, Pull shirt over trunk Assist Level: Set up Function - Lower Body Dressing/Undressing What is the patient wearing?: Underwear, Pants, Non-skid slipper socks Underwear - Performed by patient: Pull underwear up/down Underwear - Performed by helper: Thread/unthread right underwear leg, Thread/unthread left underwear leg Pants- Performed by patient: Pull pants up/down Pants- Performed by helper: Thread/unthread right pants leg, Thread/unthread left  pants leg Non-skid slipper socks- Performed by helper: Don/doff right sock, Don/doff left sock  Function - Toileting Toileting activity did not occur: Safety/medical concerns Toileting steps completed by patient: Adjust clothing prior to toileting, Performs perineal hygiene, Adjust clothing after toileting Toileting steps completed by helper: Adjust clothing prior to toileting, Adjust clothing after toileting Toileting Assistive Devices: Grab bar or rail Assist level: Touching or steadying assistance (Pt.75%)  Function - Air cabin crew transfer assistive device: Walker Assist level to toilet: Touching or steadying assistance (Pt > 75%) Assist level from toilet: Touching or steadying assistance (Pt > 75%)  Function - Chair/bed transfer Chair/bed transfer method: Ambulatory, Stand pivot Chair/bed transfer assist level: Touching or steadying assistance (Pt > 75%) Chair/bed transfer assistive device: Armrests Chair/bed transfer details: Verbal cues for sequencing, Verbal cues for safe use of DME/AE  Function - Locomotion: Wheelchair Will patient use wheelchair at discharge?: No Type: Manual Max wheelchair distance: 150 Assist Level: Supervision or verbal cues Assist Level: Supervision or verbal cues Assist Level: Supervision or verbal cues Turns around,maneuvers to table,bed, and toilet,negotiates 3% grade,maneuvers on rugs and over doorsills: No Function - Locomotion: Ambulation Assistive device: Walker-rolling Max distance: 50 Assist level: Touching or steadying assistance (Pt > 75%) Assist level: Touching or steadying assistance (Pt > 75%) Assist level: Touching or steadying assistance (Pt > 75%) Walk 150 feet activity did not occur: Safety/medical concerns (fatigue) Assist level: Touching or steadying assistance (Pt > 75%)  Function - Comprehension Comprehension: Auditory Comprehension assist level: Follows complex conversation/direction with no assist  Function -  Expression Expression: Verbal Expression assist level: Expresses complex ideas: With no assist  Function - Social Interaction Social Interaction assist level: Interacts appropriately with others - No medications needed.  Function - Problem Solving Problem solving assist level: Solves complex problems: Recognizes & self-corrects  Function - Memory Memory assist level: Complete Independence: No helper Patient normally able to recall (first 3 days only): Current season, Location of own room, Staff names and faces, That he or she is in a hospital Problem List and Plan: 1. Functional deficits secondary to deconditioning/cartilage in this chest wall mass 2.  DVT Prophylaxis/Anticoagulation: Subcutaneous Lovenox. Monitor platelet counts and any signs of bleeding 3. Pain Management/chronic pain syndrome/multiple back surgeries: Oxycodone and Valium as needed/chronic pain pump refilled 05/27/2015 4. Acute blood loss anemia. Latest hemoglobin 9.0. Follow-up CBC 5. Neuropsych: This patient is capable of making decisions on his own behalf. 6. Skin/Wound Care: Routine skin checks 7. Fluids/Electrolytes/Nutrition: Routine I&O with follow-up chemistries, 0-35% meals    Good po fluid intake  8. CAD status post PTCA. Discussed when to resume aspirin 81 mg daily. No chest pain or shortness of breath 9. Tobacco abuse. Counseling 10. Hypertension. Lasix 40 mg daily  11. Bouts of urinary retention. Placed on Flomax. Voiding trial 12.  Peripheral edema, elevate legs, compression hose, protein supplementation 13.  Hypoalb- beneprotein, should help with wound healing and peripheral edema 14.  Constipation - no BM recorded since 10/22, adjust laxatives  LOS (Days) 2 A FACE TO FACE EVALUATION WAS PERFORMED  KIRSTEINS,ANDREW E 06/02/2015, 7:42 AM
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
## 2015-06-02 NOTE — Progress Notes (Signed)
Occupational Therapy Session Note  Patient Details  Name: Jerry [name] MRN: [id number] Date of Birth: [birth year]  Today's Date: 06/02/2015 OT Individual Time: 0830-1000 OT Individual Time Calculation (min): 90 min    Short Term Goals: Week 1:  OT Short Term Goal 1 (Week 1): Pt will don pants with S using reacher as necessary. OT Short Term Goal 2 (Week 1): Pt will toilet with S. OT Short Term Goal 3 (Week 1): Pt will direct care givers to set up foot bath soak. OT Short Term Goal 4 (Week 1): Pt will be able to make a sandwich with S.  Skilled Therapeutic Interventions/Progress Updates:    Pt seen this session to facilitate functional mobility and activity tolerance. Pt stated that he had just used the urinal and did not need to toilet. Pt was still wearing yesterdays clothing and stated his daughter took everything else to have it washed.  Pt used foot soak with set in tub.  ACE wraps removed from legs and TED hose applied. Used sock aid with min A to don non grip socks. The sock aid is too narrow for his feet with edema at this time.  Demonstrated and explained how pt could use reacher to don pants over feet.   Pt used RW to ambulate 50 ft, then needed a rest and continued to gym using w/c.  In gym, pt used weighted dowel bar for UE exercise to build activity tolerance. HR and O2 levels WFL during seated exercise.  Pt then worked on standing with RW completing alternating toe taps and side stepping. After 3 min of standing exercise,  Pt's HR increases to 120-130 bpm.   O2 levels WNL. HR resumes to [facility] quickly with seated rest. Pt repeated standing exercises 2 more sets. Pt propelled chair back to room and transferred to recliner to elevate feet. Pt with all needs met. Therapy Documentation Precautions:  Precautions Precautions: Fall Precaution Comments: 2 drains and VAC; significant LE edema-keep elevated; HOB >45 deg Required Braces or Orthoses: Other Brace/Splint Other Brace/Splint:  abdominal binder Restrictions Weight Bearing Restrictions: No Vital Signs: Therapy Vitals Pulse Rate: 72 Resp: 16 Patient Position (if appropriate): Sitting Oxygen Therapy SpO2: 93 % O2 Device: Not Delivered Pain: Pain Assessment Pain Assessment: No/denies pain Pain Score: 0-No pain ADL:  See Function Navigator for Current Functional Status.   Therapy/Group: Individual Therapy  Uncertain 06/02/2015, 12:16 PM

## 2015-06-03 ENCOUNTER — Inpatient Hospital Stay (HOSPITAL_COMMUNITY): Payer: Medicare HMO | Admitting: Physical Therapy

## 2015-06-03 ENCOUNTER — Inpatient Hospital Stay (HOSPITAL_COMMUNITY): Payer: Medicare HMO | Admitting: Occupational Therapy

## 2015-06-03 MED ORDER — OXYCODONE HCL 5 MG PO TABS: 5.0000 mg | ORAL_TABLET | ORAL | Status: DC | PRN

## 2015-06-03 NOTE — Progress Notes (Signed)
      [address]       [place] [address]             [phone]      Mr. Herter feels well. He is very positive about his rehab experience  BP 133/59 mmHg  Pulse 74  Temp(Src) 97.7 F (36.5 C) (Oral)  Resp 20  Ht 6' (1.829 m)  Wt 246 lb 9.6 oz (111.857 kg)  BMI 33.44 kg/m2  SpO2 95%  His path is finally back from his surgery a month ago. 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  Will discuss with Med Onc and Rad Onc in [place]. Roxan Hockey, MD [facility] [phone]

## 2015-06-03 NOTE — Progress Notes (Signed)
Physical Therapy Session Note  Patient Details  Name: Jerry [name] MRN: [id number] Date of Birth: 01-Jun-1951  Today's Date: 06/03/2015 PT Individual Time: 0800-0900 PT Individual Time Calculation (min): 60 min   Short Term Goals: Week 1: = LTG due to estimated length of stay   Skilled Therapeutic Interventions/Progress Updates:    Pt received seated on EOB finishing breakfast, no c/o pain and agreeable to treatment. Performed donning B shoes with supervision, however totalA for tying shoes due to pt reported pain/discomfort and inability to reach shoes due to abdominal binder. Total A for donning teds. Pt cued for ambulation until fatigued at which time pt had seated rest break in w/c; able to ambulate 2 min 5 sec, 140' with RW and supervision. Educated pt on interval training with pt to improve endurance for functional mobility and adls. Performed gait for approximately 2 min each for 5 trials, seated rest breaks between trials for 3-5 min before pt rested enough for next trial. Performed total of 670' over 5 trials, no LOB noted. Min verbal cues for RW management to keep AD closer to body. Pt returned to room and remained seated in recliner with all needs in reach, LEs elevated.   Therapy Documentation Precautions:  Precautions Precautions: Fall Precaution Comments: 2 drains and VAC; significant LE edema-keep elevated; HOB >45 deg Required Braces or Orthoses: Other Brace/Splint Other Brace/Splint: abdominal binder Restrictions Weight Bearing Restrictions: No Pain: Pain Assessment Pain Assessment: No/denies pain Pain Score: 0-No pain   See Function Navigator for Current Functional Status.   Therapy/Group: Individual Therapy  Luberta Mutter 06/03/2015, 9:09 AM

## 2015-06-03 NOTE — Progress Notes (Signed)
Subjective/Complaints:  ROS  Objective: Vital Signs: Blood pressure 127/56, pulse 63, temperature 98.9 F (37.2 C), temperature source Oral, resp. rate 18, height 6' (1.829 m), weight 111.857 kg (246 lb 9.6 oz), SpO2 93 %. No results found. Results for orders placed or performed during the hospital encounter of 05/31/15 (from the past 72 hour(s))  CBC WITH DIFFERENTIAL     Status: Abnormal   Collection Time: 06/01/15  5:27 AM  Result Value Ref Range   WBC 6.3 4.0 - 10.5 K/uL   RBC 3.18 (L) 4.22 - 5.81 MIL/uL   Hemoglobin 9.1 (L) 13.0 - 17.0 g/dL   HCT 71.2 (L) 44.2 - 50.0 %   MCV 91.5 78.0 - 100.0 fL   MCH 28.6 26.0 - 34.0 pg   MCHC 31.3 30.0 - 36.0 g/dL   RDW 20.0 61.6 - 28.1 %   Platelets 259 150 - 400 K/uL   Neutrophils Relative % 33 %   Neutro Abs 2.1 1.7 - 7.7 K/uL   Lymphocytes Relative 50 %   Lymphs Abs 3.2 0.7 - 4.0 K/uL   Monocytes Relative 9 %   Monocytes Absolute 0.6 0.1 - 1.0 K/uL   Eosinophils Relative 7 %   Eosinophils Absolute 0.5 0.0 - 0.7 K/uL   Basophils Relative 1 %   Basophils Absolute 0.0 0.0 - 0.1 K/uL  Comprehensive metabolic panel     Status: Abnormal   Collection Time: 06/01/15  5:27 AM  Result Value Ref Range   Sodium 134 (L) 135 - 145 mmol/L   Potassium 3.4 (L) 3.5 - 5.1 mmol/L   Chloride 94 (L) 101 - 111 mmol/L   CO2 30 22 - 32 mmol/L   Glucose, Bld 95 65 - 99 mg/dL   BUN 7 6 - 20 mg/dL   Creatinine, Ser 7.95 0.61 - 1.24 mg/dL   Calcium 8.2 (L) 8.9 - 10.3 mg/dL   Total Protein 6.1 (L) 6.5 - 8.1 g/dL   Albumin 2.4 (L) 3.5 - 5.0 g/dL   AST 22 15 - 41 U/L   ALT 17 17 - 63 U/L   Alkaline Phosphatase 77 38 - 126 U/L   Total Bilirubin 0.5 0.3 - 1.2 mg/dL   GFR calc non Af Amer >60 >60 mL/min   GFR calc Af Amer >60 >60 mL/min    Comment: (NOTE) The eGFR has been calculated using the CKD EPI equation. This calculation has not been validated in all clinical situations. eGFR's persistently <60 mL/min signify possible Chronic  Kidney Disease.    Anion gap 10 5 - 15     HEENT: normal Cardio: RRR Resp: CTA B/L and unlabored GI: BS positive and NT, ND Extremity:  Edema 2-3+ Bilateral pretib Skin:   Wound Right chest drain sites, midline incision healing well Neuro: Alert/Oriented, Normal Sensory and Abnormal Motor 4/5 bilateral delt, bi, tri, grip, HF, KE, ADF Musc/Skel:  LB tender and Other no pain with UE or LE AROM Gen NAD   Assessment/Plan: 1. 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 Physiatrist is providing close team supervision and 24 hour management of active medical problems listed below. Physiatrist and rehab team continue to assess barriers to discharge/monitor patient progress toward functional and medical goals. FIM: Function - Bathing Position: Sitting EOB Body parts bathed by patient: Right arm, Left arm, Front perineal area, Buttocks, Right upper leg, Left upper leg, Chest  Body parts bathed by helper: Back, Left lower leg, Right lower leg Bathing not applicable: Abdomen  Function- Upper Body Dressing/Undressing What is the patient wearing?: Pull over shirt/dress Pull over shirt/dress - Perfomed by patient: Thread/unthread right sleeve, Thread/unthread left sleeve, Put head through opening, Pull shirt over trunk Assist Level: Set up Function - Lower Body Dressing/Undressing What is the patient wearing?: Underwear, Pants, Non-skid slipper socks Underwear - Performed by patient: Pull underwear up/down Underwear - Performed by helper: Thread/unthread right underwear leg, Thread/unthread left underwear leg Pants- Performed by patient: Pull pants up/down Pants- Performed by helper: Thread/unthread right pants leg, Thread/unthread left pants leg Non-skid slipper socks- Performed by helper: Don/doff right sock, Don/doff left sock  Function -  Toileting Toileting activity did not occur: Safety/medical concerns Toileting steps completed by patient: Adjust clothing prior to toileting, Performs perineal hygiene, Adjust clothing after toileting Toileting steps completed by helper: Adjust clothing prior to toileting, Adjust clothing after toileting Toileting Assistive Devices: Grab bar or rail Assist level: Touching or steadying assistance (Pt.75%)  Function - Air cabin crew transfer assistive device: Walker Assist level to toilet: Touching or steadying assistance (Pt > 75%) Assist level from toilet: Touching or steadying assistance (Pt > 75%)  Function - Chair/bed transfer Chair/bed transfer method: Ambulatory, Stand pivot Chair/bed transfer assist level: Supervision or verbal cues Chair/bed transfer assistive device: Walker Chair/bed transfer details: Verbal cues for sequencing  Function - Locomotion: Wheelchair Will patient use wheelchair at discharge?: No Type: Manual Max wheelchair distance: 110 Assist Level: Supervision or verbal cues Assist Level: Supervision or verbal cues Assist Level: Supervision or verbal cues Turns around,maneuvers to table,bed, and toilet,negotiates 3% grade,maneuvers on rugs and over doorsills: No Function - Locomotion: Ambulation Assistive device: Walker-rolling Max distance: 110 Assist level: Supervision or verbal cues Assist level: Supervision or verbal cues Assist level: Supervision or verbal cues Walk 150 feet activity did not occur: Safety/medical concerns (fatigue) Assist level: Touching or steadying assistance (Pt > 75%)  Function - Comprehension Comprehension: Auditory Comprehension assist level: Follows complex conversation/direction with no assist  Function - Expression Expression: Verbal Expression assist level: Expresses complex ideas: With no assist  Function - Social Interaction Social Interaction assist level: Interacts appropriately with others - No medications  needed.  Function - Problem Solving Problem solving assist level: Solves complex problems: Recognizes & self-corrects  Function - Memory Memory assist level: Complete Independence: No helper Patient normally able to recall (first 3 days only): Current season, Location of own room, Staff names and faces, That he or she is in a hospital                        Problem List and Plan: 1. Functional deficits secondary to deconditioning/cartilage in this chest wall mass, path showing osteosarcoma CVTS aware and have ordered 2nd opinion pathology eval from [facility], pt is aware of this, tumor margins were clean  2.  DVT Prophylaxis/Anticoagulation: Subcutaneous Lovenox. Monitor platelet counts and any signs of bleeding 3. Pain Management/chronic pain syndrome/multiple back surgeries: off po Oxycodone ,   Valium as needed/chronic pain pump refilled 05/27/2015 4. Acute blood loss anemia. Latest hemoglobin 9.0. Follow-up CBC 5. Neuropsych: This patient is capable of making decisions on his own behalf. 6. Skin/Wound Care: Routine skin checks 7. Fluids/Electrolytes/Nutrition: Routine I&O with follow-up chemistries, 50-100% meals    Good po fluid intake  8. CAD status post PTCA. Discussed when to resume aspirin 81 mg daily. No chest pain or shortness of  breath 9. Tobacco abuse. Counseling 10. Hypertension. Lasix 40 mg daily 11. Bouts of urinary retention. Placed on Flomax. Voiding trial 12.  Peripheral edema, elevate legs, compression hose, protein supplementation 13.  Hypoalb- beneprotein, should help with wound healing and peripheral edema 14.  Constipation - soft BM .  10/27, improved off oral narcotics and laxatives  LOS (Days) 3 A FACE TO FACE EVALUATION WAS PERFORMED  KIRSTEINS,ANDREW E 06/03/2015, 7:31 AM

## 2015-06-03 NOTE — Progress Notes (Signed)
Occupational Therapy Session Note  Patient Details  Name: Jerry [name] MRN: [id number] Date of Birth: June 15, 1951  Today's Date: 06/03/2015 OT Individual Time: 9476-5465 and 1318-1430 OT Individual Time Calculation (min): 56 min and 72 min   Short Term Goals: Week 1:  OT Short Term Goal 1 (Week 1): Pt will don pants with S using reacher as necessary. OT Short Term Goal 2 (Week 1): Pt will toilet with S. OT Short Term Goal 3 (Week 1): Pt will direct care givers to set up foot bath soak. OT Short Term Goal 4 (Week 1): Pt will be able to make a sandwich with S.  Skilled Therapeutic Interventions/Progress Updates:    1) Treatment session with focus on functional mobility and activity tolerance.  Pt reporting already changing clothes prior to session with no self-care needs remaining.  Ambulated 185 feet with RW and close supervision before requiring seated rest break.  Pt's HR and O2 WNL throughout session.  In therapy gym, engaged in BUE strengthening and activity tolerance with 4# dowel rod followed by ball toss with pt batting ball back to therapist with 3# dowel rod.  Pt required multiple rest breaks throughout session.  Completed modified 5 time sit > stand with 40 seconds and pt requiring minimal use of hands to push up.  Pt ambulated approx 100 feet back towards room before requiring seated rest break.  Setup at sink to complete oral care and applying dentures.  2) Treatment session with focus on sit > stand, standing tolerance, and overall activity tolerance.  Upon arrival at 55, pt's daughter present and both reporting friend on his way for quick visit and pt requesting to visit briefly.  Returned 15 mins later, to which pt's visitors had left.  Ambulated approx 100 feet with RW and supervision, pt reporting increased pain in Lt knee and requested to sit.  Engaged in sit > stand with focus on increased standing tolerance without UE support.  Pt able to maintain standing 3-4 mins x3  repetitions while engaging in table top task.  Engaged in reaching task with sit > stand requiring pt to reach just outside BOS and complete trunk rotation to obtain items.  Returned to room via w/c due to BLE fatigue.  Therapy Documentation Precautions:  Precautions Precautions: Fall Precaution Comments: 2 drains and VAC; significant LE edema-keep elevated; HOB >45 deg Required Braces or Orthoses: Other Brace/Splint Other Brace/Splint: abdominal binder Restrictions Weight Bearing Restrictions: No Pain: Pain Assessment Pain Assessment: No/denies pain Pain Score: 0-No pain  See Function Navigator for Current Functional Status.   Therapy/Group: Individual Therapy  [name] 06/03/2015, 12:30 PM

## 2015-06-04 ENCOUNTER — Inpatient Hospital Stay (HOSPITAL_COMMUNITY): Payer: Medicare HMO | Admitting: Occupational Therapy

## 2015-06-04 ENCOUNTER — Inpatient Hospital Stay (HOSPITAL_COMMUNITY): Payer: Medicare HMO | Admitting: Physical Therapy

## 2015-06-04 ENCOUNTER — Encounter (HOSPITAL_COMMUNITY): Payer: Medicare HMO | Admitting: Occupational Therapy

## 2015-06-04 NOTE — Progress Notes (Signed)
Subjective/Complaints: No complaints. Slept well. Swelling under control.  ROS: Pt denies fever, rash/itching, headache, blurred or double vision, nausea, vomiting, abdominal pain, diarrhea, chest pain, shortness of breath, palpitations, dysuria, dizziness, neck or back pain, bleeding, anxiety, or depression   Objective: Vital Signs: Blood pressure 129/54, pulse 65, temperature 98.5 F (36.9 C), temperature source Oral, resp. rate 17, height 6' (1.829 m), weight 111.857 kg (246 lb 9.6 oz), SpO2 91 %. No results found. No results found for this or any previous visit (from the past 72 hour(s)).   HEENT: normal Cardio: RRR Resp: CTA B/L and unlabored GI: BS positive and NT, ND Extremity:  Edema 2+ Bilateral pretib Skin:   Wound Right chest drain sites, midline incision healing well. Abrasions upper ext Neuro: Alert/Oriented, Normal Sensory and Abnormal Motor 4/5 bilateral delt, bi, tri, grip, HF, KE, ADF Musc/Skel:  LB tender and Other no pain with UE or LE AROM Gen NAD   Assessment/Plan: 1. 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 Physiatrist is providing close team supervision and 24 hour management of active medical problems listed below. Physiatrist and rehab team continue to assess barriers to discharge/monitor patient progress toward functional and medical goals. FIM: Function - Bathing Position: Sitting EOB Body parts bathed by patient: Right arm, Left arm, Front perineal area, Buttocks, Right upper leg, Left upper leg, Chest Body parts bathed by helper: Back, Left lower leg, Right lower leg Bathing not applicable: Abdomen  Function- Upper Body Dressing/Undressing What is the patient wearing?: Pull over shirt/dress Pull over shirt/dress - Perfomed by patient: Thread/unthread right sleeve, Thread/unthread left sleeve, Put head  through opening, Pull shirt over trunk Assist Level: Set up Function - Lower Body Dressing/Undressing What is the patient wearing?: Shoes, [employer]  - Performed by patient: Pull underwear up/down Underwear - Performed by helper: Thread/unthread right underwear leg, Thread/unthread left underwear leg Pants- Performed by patient: Pull pants up/down Pants- Performed by helper: Thread/unthread right pants leg, Thread/unthread left pants leg Non-skid slipper socks- Performed by helper: Don/doff right sock, Don/doff left sock Shoes - Performed by patient: Don/doff right shoe, Don/doff left shoe Shoes - Performed by helper: Fasten right, Fasten left TED Hose - Performed by helper: Don/doff right TED hose, Don/doff left TED hose  Function - Toileting Toileting activity did not occur: Safety/medical concerns Toileting steps completed by patient: Adjust clothing prior to toileting, Performs perineal hygiene, Adjust clothing after toileting Toileting steps completed by helper: Adjust clothing prior to toileting, Adjust clothing after toileting [place]: Grab bar or rail Assist level: Touching or steadying assistance (Pt.75%)  Function - Air cabin crew transfer assistive device: [place] level to toilet: Touching or steadying assistance (Pt > 75%) Assist level from toilet: Touching or steadying assistance (Pt > 75%)  Function - Chair/bed transfer Chair/bed transfer method: Ambulatory, Stand pivot Chair/bed transfer assist level: Supervision or verbal cues Chair/bed transfer assistive device: Walker Chair/bed transfer details: Verbal cues for sequencing  Function - Locomotion: Wheelchair Will patient use wheelchair at discharge?: No Type: Manual Max wheelchair distance: 110 Assist Level: Supervision or verbal cues Assist Level: Supervision or verbal cues Assist Level: Supervision or verbal cues Turns around,maneuvers to table,bed, and  toilet,negotiates 3% grade,maneuvers on rugs and over doorsills: No Function - Locomotion: Ambulation Assistive device: Walker-rolling Max distance: 140 Assist level: Supervision or verbal cues Assist level: Supervision or verbal cues Assist  level: Supervision or verbal cues Walk 150 feet activity did not occur: Safety/medical concerns (fatigue) Assist level: Touching or steadying assistance (Pt > 75%)  Function - Comprehension Comprehension: Auditory Comprehension assist level: Follows complex conversation/direction with no assist  Function - Expression Expression: Verbal Expression assist level: Expresses complex ideas: With no assist  Function - Social Interaction Social Interaction assist level: Interacts appropriately with others - No medications needed.  Function - Problem Solving Problem solving assist level: Solves complex problems: Recognizes & self-corrects  Function - Memory Memory assist level: Complete Independence: No helper Patient normally able to recall (first 3 days only): Current season, Location of own room, Staff names and faces, That he or she is in a hospital                        Problem List and Plan: 1. Functional deficits secondary to deconditioning/cartilage in this chest wall mass, path showing osteosarcoma CVTS aware and have ordered 2nd opinion pathology eval from [facility], pt is aware of this, tumor margins were clean  2.  DVT Prophylaxis/Anticoagulation: Subcutaneous Lovenox. Monitor platelet counts and any signs of bleeding 3. Pain Management/chronic pain syndrome/multiple back surgeries: off po Oxycodone ,   Valium as needed/chronic pain pump refilled 05/27/2015 4. Acute blood loss anemia. Latest hemoglobin 9.0. Follow-up CBC 5. Neuropsych: This patient is capable of making decisions on his own behalf. 6. Skin/Wound Care: Routine skin checks 7. Fluids/Electrolytes/Nutrition: Routine I&O with follow-up chemistries, 50-100% meals    Good po fluid intake    8. CAD status post PTCA. Discussed when to resume aspirin 81 mg daily. No chest pain or shortness of breath 9. Tobacco abuse. Counseling 10. Hypertension. Lasix 40 mg daily 11. Bouts of urinary retention. Placed on Flomax. Voiding well 12.  Peripheral edema, elevate legs, compression hose, protein supplementation--appears stable to improved 13.  Hypoalb- beneprotein, should help with wound healing and peripheral edema 14.  Constipation - soft BM overnight.  improved off oral narcotics and laxatives  LOS (Days) 4 A FACE TO FACE EVALUATION WAS PERFORMED  SWARTZ,ZACHARY T 06/04/2015, 8:35 AM

## 2015-06-04 NOTE — Progress Notes (Signed)
Physical Therapy Session Note  Patient Details  Name: Jerry [name] MRN: [id number] Date of Birth: Dec 14, 1950  Today's Date: 06/04/2015 PT Individual Time: 1500-1530 PT Individual Time Calculation (min): 30 min   Short Term Goals: Week 1:  PT Short Term Goal 1 (Week 1): = LTG due to short LOS  Skilled Therapeutic Interventions/Progress Updates:  Pt was seen bedside in the pm. Pt performed all transfers with S and rolling walker. Pt ambulated 150 feet x 2 with rolling walker and S. Pt performed car transfers at truck height with S and rolling walker. Pt returned to room. Pt utilized the bathroom with rolling walker and S. Pt left sitting up in recliner with call bell within reach.   Therapy Documentation Precautions:  Precautions Precautions: Fall Precaution Comments: 2 drains and VAC; significant LE edema-keep elevated; HOB >45 deg Required Braces or Orthoses: Other Brace/Splint Other Brace/Splint: abdominal binder Restrictions Weight Bearing Restrictions: No General:   Pain: No c/o pain.    See Function Navigator for Current Functional Status.   Therapy/Group: Individual Therapy  Dub Amis 06/04/2015, 3:52 PM

## 2015-06-04 NOTE — Progress Notes (Signed)
Physical Therapy Session Note  Patient Details  Name: Jerry [name] MRN: [id number] Date of Birth: August 19, 1950  Today's Date: 06/04/2015 PT Individual Time: 1100-1200 PT Individual Time Calculation (min): 60 min   Short Term Goals: Week 1:  PT Short Term Goal 1 (Week 1): = LTG due to short LOS  Skilled Therapeutic Interventions/Progress Updates:  Pt was seen bedside in the am. Pt performed all sit to stand transfers with rolling walker and S. Pt ambulated 150 feet x 2 with rolling walker and S with occasional verbal cues. In gym treatment focused on LE strengthening. Performed step taps and alternating step taps, 3 sets x 10 reps each. Pt ascended/decended 1 6" step with rolling walker and min A with verbal cues. Following treatment pt returned to room and left sitting up in recliner with feet up and call bell within reach.   Therapy Documentation Precautions:  Precautions Precautions: Fall Precaution Comments: 2 drains and VAC; significant LE edema-keep elevated; HOB >45 deg Required Braces or Orthoses: Other Brace/Splint Other Brace/Splint: abdominal binder Restrictions Weight Bearing Restrictions: No General:   Pain: No c/o pain.   See Function Navigator for Current Functional Status.   Therapy/Group: Individual Therapy  Dub Amis 06/04/2015, 12:46 PM

## 2015-06-04 NOTE — Progress Notes (Signed)
Occupational Therapy Session Note  Patient Details  Name: Jerry [name] MRN: [id number] Date of Birth: 1950-10-11  Today's Date: 06/04/2015 OT Individual Time: 1300-1345 OT Individual Time Calculation (min): 45 min    Skilled Therapeutic Interventions/Progress Updates:    Pt performed functional mobility to the gym using the RW with close supervision.  Increased trunk flexion noted with walking, with pt ambulating over 100 ft to the door of the gym from his room before needing a rest break.  He was able to use the UE ergonometer in the gym for 12 mins to increase UE strength and endurance.  Resistance placed on level 5 for the first set and level 8 for the second set.  He was able to perform 5 mins and 7 mins respectively before ambulating back toward his room.  He was only able to walk approximately 75 ft before requesting wheelchair to rest.  Therapist assisted with getting pt back to the room and he was able to transfer from the wheelchair to the bedside chair with min guard assist.  Pt left with call button and phone within reach.    Therapy Documentation Precautions:  Precautions Precautions: Fall Precaution Comments: 2 drains and VAC; significant LE edema-keep elevated; HOB >45 deg Required Braces or Orthoses: Other Brace/Splint Other Brace/Splint: abdominal binder Restrictions Weight Bearing Restrictions: No  Pain: Pain Assessment Pain Assessment: No/denies pain ADL: See Function Navigator for Current Functional Status.   Therapy/Group: Individual Therapy  Arvind Mexicano OTR/L 06/04/2015, 3:49 PM

## 2015-06-04 NOTE — Progress Notes (Signed)
Occupational Therapy Session Note  Patient Details  Name: [name] MRN: [id number] Date of Birth: [birth year]  Today's Date: 06/04/2015 OT Individual Time:  -   0900-1000  (60 min)      Short Term Goals: Week 1:  OT Short Term Goal 1 (Week 1): Pt will don pants with S using reacher as necessary. OT Short Term Goal 2 (Week 1): Pt will toilet with S. OT Short Term Goal 3 (Week 1): Pt will direct care givers to set up foot bath soak. OT Short Term Goal 4 (Week 1): Pt will be able to make a sandwich with S. Week 2:     Skilled Therapeutic Interventions/Progress Updates:    Treatment session with focus on functional mobility and activity tolerance with self-care needs.  Pt. Ambulated to sink with RW and close supervision.  Stood for 3-4 minutes while bathing before needing a seated rest break.  Pt. Completed bathing with no problems.  OT washed back.  Pt donned clothes with set up except needing assist with tying shoes.         Therapy Documentation Precautions:  Precautions Precautions: Fall Precaution Comments: 2 drains and VAC; significant LE edema-keep elevated; HOB >45 deg Required Braces or Orthoses: Other Brace/Splint Other Brace/Splint: abdominal binder Restrictions Weight Bearing Restrictions: No      Pain:  2/10  Chest, low back, both knees             See Function Navigator for Current Functional Status.   Therapy/Group: Individual Therapy  Lisa Roca 06/04/2015, 7:44 AM

## 2015-06-05 ENCOUNTER — Inpatient Hospital Stay (HOSPITAL_COMMUNITY): Payer: Medicare HMO | Admitting: Physical Therapy

## 2015-06-05 NOTE — Progress Notes (Signed)
Reassessed pt at 2 hours, no changes noted in neuro status. Changed gauze on nose to bandaid, no more bleeding noted otherwise. Pt reports pain is better and that he is okay. Cont plan of care. Vencil Basnett, Dione Plover

## 2015-06-05 NOTE — Progress Notes (Signed)
Physical Therapy Session Note  Patient Details  Name: ABDOU STOCKS MRN: [id number] Date of Birth: [birth year]  Today's Date: 06/05/2015 PT Individual Time: 1415-1430 PT Individual Time Calculation (min): 15 min   Short Term Goals: Week 1:  PT Short Term Goal 1 (Week 1): = LTG due to short LOS  Skilled Therapeutic Interventions/Progress Updates:  Pt was seen bedside in the pm. Pt sitting on edge of bed. Pt c/o soreness and pain all over following a fall while suing the bathroom, early this am. Attempted to encourage participation, pt deferred secondary to pain and soreness all over.  Pt transferred edge of bed to supine with min A and verbal cues. Pt left sitting up in bed with call bell within reach.   Therapy Documentation Precautions:  Precautions Precautions: Fall Precaution Comments: 2 drains and VAC; significant LE edema-keep elevated; HOB >45 deg Required Braces or Orthoses: Other Brace/Splint Other Brace/Splint: abdominal binder Restrictions Weight Bearing Restrictions: No General: PT Amount of Missed Time (min): 15 Minutes PT Missed Treatment Reason: Pain Vital Signs:  Pain: Pt generalized soreness all over.   See Function Navigator for Current Functional Status.   Therapy/Group: Individual Therapy  Dub Amis 06/05/2015, 2:34 PM

## 2015-06-05 NOTE — Significant Event (Signed)
Was the fall witnessed: Yes  Patient condition before and after the fall: Stable  Patient's reaction to the fall: Shaken up, but cognitively intact   Name of the doctor that was notified including date and time: Dr. Naaman Plummer notified via phone @ [phone] 06/05/15  Any interventions and vital signs:  3-4 staff members assisted pt back to bed with mechanical lift. VS taken @ 0405 T- 98.2, BP-144/68, RR-18, HR -77, O2 Sat- 98% RA  Patient called for assistance to the bathroom. Nurse arrived to patient room around 340 and assisted patient to stand to urinate with the urinal. Gilford Rile was placed in front of the patient by nurse for balance stabilization after standing. While patient was adjusting his shorts with one hand and urinal in the other hand, patient lost his balance and fell forward on the floor. Nurse alerted other staff members for assistance via hill-rom system. Mechanical lift brought to room and lifted patient back to the bed. Patient had noticeable skin tears to left hand x 2 and nose. Cleansed sites with normal saline and applied dry gauze. Pt complained of back pain and upper chest pain, as well as knee soreness with small bruise noted to right knee. Assessment completed with neuro status intact, will reassess per protocol. Tylenol 650 mg given at 417 for back and chest pain level 6/10. Patient seems to be shaken up a little, but states he is fine. Patient denies any headaches. Patient refused for [place] to call his next of kin at this time. Safety plan updated and fall risk score reassessed. Will continue to monitor patient.  Shamiyah Ngu, Dione Plover

## 2015-06-05 NOTE — Progress Notes (Signed)
Subjective/Complaints: Early this morning around 0345 pt was in bathroom with nurse and had a fall while trying to stand and adjust shorts/hold urinal. Suffered lacerations to nose and left hand/wrist.  He denies any headache. Back with generalized tenderness not far from his baseline. Knee still a little sore .  ROS: Pt denies fever, rash/itching, headache, blurred or double vision, nausea, vomiting, abdominal pain, diarrhea, chest pain, shortness of breath, palpitations, dysuria, dizziness,  anxiety, or depression   Objective: Vital Signs: Blood pressure 128/51, pulse 66, temperature 98.4 F (36.9 C), temperature source Oral, resp. rate 17, height 6' (1.829 m), weight 111.857 kg (246 lb 9.6 oz), SpO2 93 %. No results found. No results found for this or any previous visit (from the past 72 hour(s)).   HEENT: normal Cardio: RRR Resp: CTA B/L and unlabored GI: BS positive and NT, ND Extremity:  Edema 2+ Bilateral pretib Skin:   Wound Right chest drain sites, midline incision healing well. Abrasions upper ext -small 1cm lac on bridge of nose without bleeding. Left hand with two areas with larger skin tears---area close to left 5th mcp joint still bleeding slightly. Area closer to left wrist with minimal bleeding at this point.  Neuro: Alert/Oriented, Normal Sensory and Abnormal Motor 4/5 bilateral delt, bi, tri, grip, HF, KE, ADF Musc/Skel:  LB tender and Other no pain with UE or LE AROM Gen NAD   Assessment/Plan: 1. 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 Physiatrist is providing close team supervision and 24 hour management of active medical problems listed below. Physiatrist and rehab team continue to assess barriers to discharge/monitor patient progress toward functional and medical goals. FIM: Function - Bathing Position:  Wheelchair/chair at sink Body parts bathed by patient: Right arm, Left arm, Front perineal area, Buttocks, Right upper leg, Left upper leg, Chest, Right lower leg, Left lower leg Body parts bathed by helper: Back Bathing not applicable: Abdomen Assist Level: Touching or steadying assistance(Pt > 75%)  Function- Upper Body Dressing/Undressing What is the patient wearing?: Pull over shirt/dress Pull over shirt/dress - Perfomed by patient: Thread/unthread right sleeve, Thread/unthread left sleeve, Put head through opening, Pull shirt over trunk Assist Level: Set up Function - Lower Body Dressing/Undressing What is the patient wearing?: Pants, Maryln Manuel, Shoes Position: Education officer, museum at [employer] - Performed by patient: Thread/unthread right underwear leg, Thread/unthread left underwear leg, Pull underwear up/down Underwear - Performed by helper: Thread/unthread right underwear leg, Thread/unthread left underwear leg Pants- Performed by patient: Pull pants up/down, Thread/unthread right pants leg, Thread/unthread left pants leg Pants- Performed by helper: Thread/unthread right pants leg, Thread/unthread left pants leg Non-skid slipper socks- Performed by helper: Don/doff right sock, Don/doff left sock Shoes - Performed by patient: Don/doff right shoe, Don/doff left shoe Shoes - Performed by helper: Fasten right, Fasten left TED Hose - Performed by helper: Don/doff right TED hose, Don/doff left TED hose Assist for lower body dressing: Touching or steadying assistance (Pt > 75%)  Function - Toileting Toileting activity did not occur: Safety/medical concerns Toileting steps completed by patient: Adjust clothing prior to toileting, Performs perineal hygiene, Adjust clothing after toileting Toileting steps completed by helper: Adjust clothing prior to toileting, Adjust clothing after toileting Toileting Assistive Devices: Grab bar or rail Assist level: Touching or steadying assistance  (Pt.75%)  Function - Toilet Transfers Toilet transfer assistive device: [place] level to toilet: Touching or  steadying assistance (Pt > 75%) Assist level from toilet: Touching or steadying assistance (Pt > 75%)  Function - Chair/bed transfer Chair/bed transfer method: Ambulatory Chair/bed transfer assist level: Supervision or verbal cues Chair/bed transfer assistive device: Armrests, Walker Chair/bed transfer details: Verbal cues for sequencing  Function - Locomotion: Wheelchair Will patient use wheelchair at discharge?: No Type: Manual Max wheelchair distance: 110 Assist Level: Supervision or verbal cues Assist Level: Supervision or verbal cues Assist Level: Supervision or verbal cues Turns around,maneuvers to table,bed, and toilet,negotiates 3% grade,maneuvers on rugs and over doorsills: No Function - Locomotion: Ambulation Assistive device: Walker-rolling Max distance: 150 Assist level: Supervision or verbal cues Assist level: Supervision or verbal cues Assist level: Supervision or verbal cues Walk 150 feet activity did not occur: Safety/medical concerns (fatigue) Assist level: Supervision or verbal cues Assist level: Touching or steadying assistance (Pt > 75%)  Function - Comprehension Comprehension: Auditory Comprehension assist level: Follows complex conversation/direction with no assist  Function - Expression Expression: Verbal Expression assist level: Expresses complex ideas: With no assist  Function - Social Interaction Social Interaction assist level: Interacts appropriately with others - No medications needed.  Function - Problem Solving Problem solving assist level: Solves complex problems: Recognizes & self-corrects  Function - Memory Memory assist level: Complete Independence: No helper Patient normally able to recall (first 3 days only): Current season, Location of own room, Staff names and faces, That he or she is in a  hospital                        Problem List and Plan: 1. Functional deficits secondary to deconditioning/cartilage in this chest wall mass, path showing osteosarcoma CVTS aware and have ordered 2nd opinion pathology eval from [facility], pt is aware of this, tumor margins were clean  2.  DVT Prophylaxis/Anticoagulation: Subcutaneous Lovenox. Monitor platelet counts and any signs of bleeding 3. Pain Management/chronic pain syndrome/multiple back surgeries: off po Oxycodone ,   Valium as needed/chronic pain pump refilled 05/27/2015 4. Acute blood loss anemia. Latest hemoglobin 9.0. Follow-up CBC 5. Neuropsych: This patient is capable of making decisions on his own behalf. 6. Skin/Wound Care: local care to nose, left hand after fall. Downsize dressing slightly for left hand so that he can use functionally. 7. Fluids/Electrolytes/Nutrition: Routine I&O with follow-up chemistries, 50-100% meals    Good po fluid intake   8. CAD status post PTCA.  resume aspirin 81 mg daily at some point. No chest pain or shortness of breath 9. Tobacco abuse. Counseling 10. Hypertension. Lasix 40 mg daily 11. Bouts of urinary retention. Placed on Flomax. Voiding well 12.  Peripheral edema, elevate legs, compression hose, protein supplementation--appears stable to improved 13.  Hypoalb- beneprotein, should help with wound healing and peripheral edema 14.  Constipation - .  improved off oral narcotics and laxatives 15. Fall---appears to have suffered just skin lacerations and mild knee contusions-- Neurologically stable, remainder of exam stable  -pt remains a fall risk--will need contact assist through toileting and hygiene tasks if he's standing up, at least for now.  LOS (Days) 5 A FACE TO FACE EVALUATION WAS PERFORMED  SWARTZ,ZACHARY T 06/05/2015, 9:01 AM

## 2015-06-06 ENCOUNTER — Inpatient Hospital Stay (HOSPITAL_COMMUNITY): Payer: Medicare HMO | Admitting: Physical Therapy

## 2015-06-06 ENCOUNTER — Inpatient Hospital Stay (HOSPITAL_COMMUNITY): Payer: Medicare HMO | Admitting: Occupational Therapy

## 2015-06-06 NOTE — Progress Notes (Signed)
Occupational Therapy Session Note  Patient Details  Name: Jerry [name] MRN: [id number] Date of Birth: 04-10-1951  Today's Date: 06/06/2015 OT Individual Time: 0800-0900 and 1115-1200 OT Individual Time Calculation (min): 60 min and 45 min   Short Term Goals: Week 1:  OT Short Term Goal 1 (Week 1): Pt will don pants with S using reacher as necessary. OT Short Term Goal 2 (Week 1): Pt will toilet with S. OT Short Term Goal 3 (Week 1): Pt will direct care givers to set up foot bath soak. OT Short Term Goal 4 (Week 1): Pt will be able to make a sandwich with S.  Skilled Therapeutic Interventions/Progress Updates:    1) ADL retraining with focus on standing balance and increased independence with self-care tasks.  Pt completed stand pivot transfers with supervision and completed UB bathing at supervision/setup level.  Completed LB dressing with setup assist except requiring assist to tie shoes.  Pt required multiple rest breaks throughout session.    2) Treatment session with focus on safety in home environment and overall strengthening.  Discussed use of RW bag or tray on RW to use when transporting items (ie drinks).  Discussed use of counters to slide items to transport while maintaining safe UE positioning on RW.  Engaged in [place] with 4# medicine ball with PNF pattern reaching, chest presses, and overhead presses - modified due to premorbid shoulder pain.  2 reps of 10 of each above exercise.  Pt ambulated approx 100 feet with RW towards room, requiring seated rest break.  Then completed toileting in standing with min/steady assist.  Therapy Documentation Precautions:  Precautions Precautions: Fall Precaution Comments: 2 drains and VAC; significant LE edema-keep elevated; HOB >45 deg Required Braces or Orthoses: Other Brace/Splint Other Brace/Splint: abdominal binder Restrictions Weight Bearing Restrictions: No Pain:   Pt with no c/o pain  See Function Navigator for  Current Functional Status.   Therapy/Group: Individual Therapy  [name] 06/06/2015, 9:38 AM

## 2015-06-06 NOTE — Progress Notes (Signed)
Physical Therapy Session Note  Patient Details  Name: Jerry [name] MRN: [id number] Date of Birth: [birth year]  Today's Date: 06/06/2015 PT Individual Time: 0900-1000 and 1430-1500 PT Individual Time Calculation (min): 60 min and 30 min (total 90 min)   Short Term Goals: Week 1:  PT Short Term Goal 1 (Week 1): = LTG due to short LOS  Skilled Therapeutic Interventions/Progress Updates:    0900-1000: Pt received seated in recliner with RN present; no c/o pain and agreeable to treatment. Pt reports pain from fall over the weekend has resolved with the exception of some L shoulder pain when lifting arm "about 75% of the way". W/c propulsion with BUEs x175' with supervision, occasional short rest breaks due to fatigue and when talking. Gait with RW and close supervision x150' for two trials, extended seated rest break between trials due to fatigue. Occasional cueing for RW management for energy conservation and increased safety. LE strengthening exercises in seated position for 2 sets 15 reps of each of the following with 2# ankle cuff weights: hip flexion, long arc quad, hip adduction isometric, sit <>stand. Pt returned to room and transferred w/c >recliner with close S. Remained seated in recliner with LEs elevated and all needs within reach.  1430-1500: Pt received seated in recliner, no c/o pain and agreeable to treatment. Stand pivot recliner>w/c with close S. W/c propulsion with BUE/BLE x75' before pt reports strong cramping in RLE which resolves after 1-2 minutes. Pt began propelling as before and LLE began cramping, again relieved after 1-2 min. Foot rests donned to allow pt to propel remaining distance to gym with BUEs; RN alerted to LE pain/cramping however pt states he does not want to take medication yet unless it continues. Nustep x10 min on level 3 with average 41 steps/min with BUE/BLE for aerobic endurance and extremity strengthening. Pt returned to room and returned to recliner with S.  Remained seated in recliner with all needs within reach at completion of session.   Therapy Documentation Precautions:  Precautions Precautions: Fall Precaution Comments: 2 drains and VAC; significant LE edema-keep elevated; HOB >45 deg Required Braces or Orthoses: Other Brace/Splint Other Brace/Splint: abdominal binder Restrictions Weight Bearing Restrictions: No Pain: Pain Assessment Pain Assessment: No/denies pain Pain Score: 0-No pain   See Function Navigator for Current Functional Status.   Therapy/Group: Individual Therapy  Luberta Mutter 06/06/2015, 3:06 PM

## 2015-06-06 NOTE — Progress Notes (Signed)
Pt told NT that he hasn't been to the bathroom in 12 hours and no BM since Thursday. NT and RN looked at 1&O and it had been 8 hours since last void and pt currently unable to void.  LBM charted 10/28. Pt very anxious and asked for PRN valium. Valium administered and given time to work before attempting to void again. Abdominal binder placed on pt, NT and RN assisted patient to attempt to void with no results. I&O cath at 0000 for 873ml. Pt refused BM medication tonight and asked for something in the morning instead. Will continue to monitor. Kennieth Francois, RN

## 2015-06-06 NOTE — Progress Notes (Signed)
Subjective/Complaints:  .Denies any pains s/p fall while standing to urinate with CNA in rm ROS: Pt denies fever, rash/itching, headache, blurred or double vision, nausea, vomiting, abdominal pain, diarrhea, chest pain, shortness of breath, palpitations, dysuria, dizziness,    Objective: Vital Signs: Blood pressure 125/59, pulse 73, temperature 98 F (36.7 C), temperature source Oral, resp. rate 18, height 6' (1.829 m), weight 111.857 kg (246 lb 9.6 oz), SpO2 96 %. No results found. No results found for this or any previous visit (from the past 72 hour(s)).   HEENT: normal Cardio: RRR Resp: CTA B/L and unlabored GI: BS positive and NT, ND Extremity:  Edema 2+ Bilateral pretib Skin:   Wound Right chest drain sites, midline incision healing well. Abrasions upper ext -small 1cm lac on bridge of nose without bleeding. Left hand with two areas with larger skin tears---area close to left 5th mcp joint still bleeding slightly. Area closer to left wrist with minimal bleeding at this point.  Neuro: Alert/Oriented, Normal Sensory and Abnormal Motor 4/5 bilateral delt, bi, tri, grip, HF, KE, ADF Musc/Skel:  LB tender and Other no pain with UE or LE AROM Gen NAD   Assessment/Plan: 1. 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 Physiatrist is providing close team supervision and 24 hour management of active medical problems listed below. Physiatrist and rehab team continue to assess barriers to discharge/monitor patient progress toward functional and medical goals. FIM: Function - Bathing Position: Wheelchair/chair at sink Body parts bathed by patient: Right arm, Left arm, Front perineal area, Buttocks, Right upper leg, Left upper leg, Chest, Right lower leg, Left lower leg Body parts bathed by helper: Back Bathing not applicable: Abdomen Assist  Level: Touching or steadying assistance(Pt > 75%)  Function- Upper Body Dressing/Undressing What is the patient wearing?: Pull over shirt/dress Pull over shirt/dress - Perfomed by patient: Thread/unthread right sleeve, Thread/unthread left sleeve, Put head through opening, Pull shirt over trunk Assist Level: Set up Function - Lower Body Dressing/Undressing What is the patient wearing?: Pants, Maryln Manuel, Shoes Position: Education officer, museum at [employer] - Performed by patient: Thread/unthread right underwear leg, Thread/unthread left underwear leg, Pull underwear up/down Underwear - Performed by helper: Thread/unthread right underwear leg, Thread/unthread left underwear leg Pants- Performed by patient: Pull pants up/down, Thread/unthread right pants leg, Thread/unthread left pants leg Pants- Performed by helper: Thread/unthread right pants leg, Thread/unthread left pants leg Non-skid slipper socks- Performed by helper: Don/doff right sock, Don/doff left sock Shoes - Performed by patient: Don/doff right shoe, Don/doff left shoe Shoes - Performed by helper: Fasten right, Fasten left TED Hose - Performed by helper: Don/doff right TED hose, Don/doff left TED hose Assist for lower body dressing: Touching or steadying assistance (Pt > 75%)  Function - Toileting Toileting activity did not occur: Safety/medical concerns Toileting steps completed by patient: Adjust clothing prior to toileting, Performs perineal hygiene, Adjust clothing after toileting Toileting steps completed by helper: Adjust clothing prior to toileting, Adjust clothing after toileting Toileting Assistive Devices: Grab bar or rail Assist level: Touching or steadying assistance (Pt.75%)  Function - Air cabin crew transfer assistive device: [place] level to toilet: Touching or steadying assistance (Pt > 75%) Assist level from toilet: Touching or steadying assistance (Pt > 75%)  Function - Chair/bed  transfer Chair/bed transfer method: Ambulatory Chair/bed transfer assist level: Supervision or verbal cues Chair/bed transfer assistive device: Armrests, Walker Chair/bed  transfer details: Verbal cues for sequencing  Function - Locomotion: Wheelchair Will patient use wheelchair at discharge?: No Type: Manual Max wheelchair distance: 110 Assist Level: Supervision or verbal cues Assist Level: Supervision or verbal cues Assist Level: Supervision or verbal cues Turns around,maneuvers to table,bed, and toilet,negotiates 3% grade,maneuvers on rugs and over doorsills: No Function - Locomotion: Ambulation Assistive device: Walker-rolling Max distance: 150 Assist level: Supervision or verbal cues Assist level: Supervision or verbal cues Assist level: Supervision or verbal cues Walk 150 feet activity did not occur: Safety/medical concerns (fatigue) Assist level: Supervision or verbal cues Assist level: Touching or steadying assistance (Pt > 75%)  Function - Comprehension Comprehension: Auditory Comprehension assist level: Follows complex conversation/direction with no assist  Function - Expression Expression: Verbal Expression assist level: Expresses complex ideas: With no assist  Function - Social Interaction Social Interaction assist level: Interacts appropriately with others - No medications needed.  Function - Problem Solving Problem solving assist level: Solves complex problems: Recognizes & self-corrects  Function - Memory Memory assist level: Complete Independence: No helper Patient normally able to recall (first 3 days only): Current season, Location of own room, Staff names and faces, That he or she is in a hospital                        Problem List and Plan: 1. Functional deficits secondary to deconditioning/cartilage in this chest wall mass, path showing osteosarcoma CVTS aware and have ordered 2nd opinion pathology eval from [facility], pt is aware of this, tumor margins were  clean  2.  DVT Prophylaxis/Anticoagulation: Subcutaneous Lovenox. Monitor platelet counts and any signs of bleeding, latest 259K 3. Pain Management/chronic pain syndrome/multiple back surgeries: off po Oxycodone ,   Valium as needed/chronic pain pump refilled 05/27/2015 4. Acute blood loss anemia. Latest hemoglobin 9.1.  5. Neuropsych: This patient is capable of making decisions on his own behalf. 6. Skin/Wound Care: local care to nose, left hand after fall. Downsize dressing slightly for left hand so that he can use functionally. 7. Fluids/Electrolytes/Nutrition: Routine I&O with follow-up chemistries, 50-100% meals    Good po fluid intake   8. CAD status post PTCA.  resume aspirin 81 mg daily at some point. No chest pain or shortness of breath 9. Tobacco abuse. Counseling 10. Hypertension. Lasix 40 mg daily, 125/59 11. Bouts of urinary retention. Placed on Flomax. Voiding well 12.  Peripheral edema, elevate legs, compression hose, protein supplementation--appears stable to improved 13.  Hypoalb- beneprotein, should help with wound healing and peripheral edema 14.  Constipation - .  improved off oral narcotics and laxatives 15. Fall---appears to have suffered just skin lacerations and mild knee contusions-- Neurologically stable, remainder of exam stable  -pt remains a fall risk--will need contact assist through toileting and hygiene tasks if he's standing up, at least for now.  LOS (Days) 6 A FACE TO FACE EVALUATION WAS PERFORMED  KIRSTEINS,ANDREW E 06/06/2015, 7:21 AM

## 2015-06-07 ENCOUNTER — Inpatient Hospital Stay (HOSPITAL_COMMUNITY): Payer: Medicare HMO | Admitting: Occupational Therapy

## 2015-06-07 ENCOUNTER — Inpatient Hospital Stay (HOSPITAL_COMMUNITY): Payer: Medicare HMO | Admitting: Physical Therapy

## 2015-06-07 LAB — CREATININE, SERUM
CREATININE: 0.86 mg/dL (ref 0.61–1.24)
GFR calc Af Amer: >60 mL/min (ref >60)
GFR calc non Af Amer: >60 mL/min (ref >60)

## 2015-06-07 NOTE — Progress Notes (Signed)
Subjective/Complaints: Pt without new issues last noc, no pain from incisions or drain sites  ROS: Pt denies fever, rash/itching, headache, blurred or double vision, nausea, vomiting, abdominal pain, diarrhea, chest pain, shortness of breath, palpitations, dysuria, dizziness,    Objective: Vital Signs: Blood pressure 119/57, pulse 66, temperature 98.9 F (37.2 C), temperature source Oral, resp. rate 17, height 6' (1.829 m), weight 111.857 kg (246 lb 9.6 oz), SpO2 95 %. No results found. Results for orders placed or performed during the hospital encounter of 05/31/15 (from the past 72 hour(s))  Creatinine, serum     Status: None   Collection Time: 06/07/15  6:00 AM  Result Value Ref Range   Creatinine, Ser 0.86 0.61 - 1.24 mg/dL   GFR calc non Af Amer >60 >60 mL/min   GFR calc Af Amer >60 >60 mL/min    Comment: (NOTE) The eGFR has been calculated using the CKD EPI equation. This calculation has not been validated in all clinical situations. eGFR's persistently <60 mL/min signify possible Chronic Kidney Disease.      HEENT: normal Cardio: RRR Resp: CTA B/L and unlabored GI: BS positive and NT, ND Extremity:  Edema 2+ Bilateral pretib Skin:   Wound Right chest drain sites,scant amt serosanguinous drainage midline incision healing well. Abrasions upper ext -small 1cm lac on bridge of nose without bleeding. Left hand with two areas with larger skin tears---area close to left 5th mcp joint still bleeding slightly. Area closer to left wrist with minimal bleeding at this point.  Neuro: Alert/Oriented, Normal Sensory and Abnormal Motor 4/5 bilateral delt, bi, tri, grip, HF, KE, ADF Musc/Skel:  LB tender and Other no pain with UE or LE AROM Gen NAD   Assessment/Plan: 1. 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 Physiatrist is providing  close team supervision and 24 hour management of active medical problems listed below. Physiatrist and rehab team continue to assess barriers to discharge/monitor patient progress toward functional and medical goals. FIM: Function - Bathing Position: Wheelchair/chair at sink Body parts bathed by patient: Right arm, Left arm, Front perineal area, Buttocks, Chest (only completed UB bathing) Body parts bathed by helper: Back Bathing not applicable: Abdomen, Right lower leg, Left lower leg, Right upper leg, Left upper leg Assist Level: Supervision or verbal cues  Function- Upper Body Dressing/Undressing What is the patient wearing?: Pull over shirt/dress Pull over shirt/dress - Perfomed by patient: Thread/unthread right sleeve, Thread/unthread left sleeve, Put head through opening, Pull shirt over trunk Assist Level: Set up Set up : To obtain clothing/put away Function - Lower Body Dressing/Undressing What is the patient wearing?: Pants, Maryln Manuel, Shoes, Underwear Position: Education officer, museum at [employer] - Performed by patient: Thread/unthread right underwear leg, Thread/unthread left underwear leg, Pull underwear up/down Underwear - Performed by helper: Thread/unthread right underwear leg, Thread/unthread left underwear leg Pants- Performed by patient: Pull pants up/down, Thread/unthread right pants leg, Thread/unthread left pants leg Pants- Performed by helper: Thread/unthread right pants leg, Thread/unthread left pants leg Non-skid slipper socks- Performed by helper: Don/doff right sock, Don/doff left sock Shoes - Performed by patient: Don/doff right shoe, Don/doff left shoe Shoes - Performed by helper: Fasten right, Fasten left TED Hose - Performed by helper: Don/doff right TED hose, Don/doff left TED hose Assist for footwear: Partial/moderate assist Assist for lower body dressing: Touching or steadying assistance (Pt > 75%)  Function - Toileting Toileting  steps completed by  patient: Adjust clothing prior to toileting, Performs perineal hygiene, Adjust clothing after toileting Toileting steps completed by helper: Adjust clothing prior to toileting, Adjust clothing after toileting Toileting Assistive Devices: Grab bar or rail Assist level: Touching or steadying assistance (Pt.75%)  Function - Toilet Transfers Toilet transfer assistive device: [place] level to toilet: Touching or steadying assistance (Pt > 75%) Assist level from toilet: Touching or steadying assistance (Pt > 75%)  Function - Chair/bed transfer Chair/bed transfer method: Stand pivot Chair/bed transfer assist level: Supervision or verbal cues Chair/bed transfer assistive device: Armrests Chair/bed transfer details: Verbal cues for sequencing  Function - Locomotion: Wheelchair Will patient use wheelchair at discharge?: No Type: Manual Max wheelchair distance: 150 Assist Level: Supervision or verbal cues Assist Level: Supervision or verbal cues Assist Level: Supervision or verbal cues Turns around,maneuvers to table,bed, and toilet,negotiates 3% grade,maneuvers on rugs and over doorsills: No Function - Locomotion: Ambulation Assistive device: Walker-rolling Max distance: 150 Assist level: Supervision or verbal cues Assist level: Supervision or verbal cues Assist level: Supervision or verbal cues Walk 150 feet activity did not occur: Safety/medical concerns (fatigue) Assist level: Supervision or verbal cues Assist level: Touching or steadying assistance (Pt > 75%)  Function - Comprehension Comprehension: Auditory Comprehension assist level: Follows complex conversation/direction with no assist  Function - Expression Expression: Verbal Expression assist level: Expresses complex ideas: With no assist  Function - Social Interaction Social Interaction assist level: Interacts appropriately with others - No medications needed.  Function - Problem Solving Problem solving assist  level: Solves complex problems: Recognizes & self-corrects  Function - Memory Memory assist level: Complete Independence: No helper Patient normally able to recall (first 3 days only): Current season, Location of own room, Staff names and faces, That he or she is in a hospital                        Problem List and Plan: 1. Functional deficits secondary to deconditioning/cartilage in this chest wall mass, path showing osteosarcoma CVTS aware and have ordered 2nd opinion pathology eval from [facility], pt is aware of this, tumor margins were clean  2.  DVT Prophylaxis/Anticoagulation: Subcutaneous Lovenox. Monitor platelet counts and any signs of bleeding, latest 259K 3. Pain Management/chronic pain syndrome/multiple back surgeries: off po Oxycodone ,   Valium as needed/chronic pain pump refilled 05/27/2015 4. Acute blood loss anemia. Latest hemoglobin 9.1.  5. Neuropsych: This patient is capable of making decisions on his own behalf. 6. Skin/Wound Care: drain sites look ok, outpt ~21m per day, ask surgery if they can be d/ced, also incisions look healed, ?removed sutures 7. Fluids/Electrolytes/Nutrition: Routine I&O with follow-up chemistries, 50-100% meals    Good po fluid intake   8. CAD status post PTCA.  resume aspirin 81 mg daily at some point. No chest pain or shortness of breath 9. Tobacco abuse. Counseling 10. Hypertension. Lasix 40 mg daily, 119/57 11. Bouts of urinary retention. Placed on Flomax. Voiding well 12.  Peripheral edema, elevate legs, compression hose, protein supplementation--appears stable to improved 13.  Hypoalb- beneprotein, should help with wound healing and peripheral edema 14.  Constipation - .  improved off oral narcotics and laxatives 15. Fall---appears to have suffered just skin lacerations and mild knee contusions-- Neurologically stable, remainder of exam stable  LOS (Days) 7 A FACE TO FACE EVALUATION WAS PERFORMED  Jerry [name] E 06/07/2015, 7:19 AM

## 2015-06-07 NOTE — Progress Notes (Signed)
Physical Therapy Session Note  Patient Details  Name: Jerry [name] MRN: [id number] Date of Birth: June 06, 1951  Today's Date: 06/07/2015 PT Individual Time: 0900-1030 PT Individual Time Calculation (min): 90 min   Short Term Goals: Week 1:  PT Short Term Goal 1 (Week 1): = LTG due to short LOS  Skilled Therapeutic Interventions/Progress Updates:    Pt received seated in recliner with son present; no c/o pain and agreeable to treatment. Gait with RW and close S, w/c follow for convenience to allow for seated rest breaks. Performed 5 trials of gait to pt tolerance before requesting seated rest break, as performed at end of last week to compare improvement in endurance and gait speed. Total distance 950' with average distance 190' per trial, a significant improvement over last week (total 670'). C/o pain initially in L knee for first two trials, however improves with increased distance and repetition. Continues to require extended 5-7 min seated rest breaks between trials due to UE/LE fatigue; utilized rest breaks to discuss rehab/healing process following prolonged bedrest in ICU, strategies for energy conservation, home setup, and plan for son to be home for first 9 days after d/c to allow pt time to increase comfort and confidence with mobility in the home. Nustep x15 min with BUE/BLE on level 3 with average 39 steps/min; performed to improve aerobic endurance, UE/LE strength for carryover into gait for functional distances with RW. Pt reports increased fatigue due to longer session, requests to return to room and return to recliner to rest. Returned to room in w/c with totalA for energy conservation. Gait into bathroom with Rw and supervision; performed clothing management and personal hygiene with supervision and grab bars for stability. Gait to return to recliner; remained seated in recliner at completion of session with all needs within reach.   Therapy Documentation Precautions:   Precautions Precautions: Fall Precaution Comments: 2 drains and VAC; significant LE edema-keep elevated; HOB >45 deg Required Braces or Orthoses: Other Brace/Splint Other Brace/Splint: abdominal binder Restrictions Weight Bearing Restrictions: No Pain: Pain Assessment Pain Assessment: No/denies pain Pain Score: 0-No pain   See Function Navigator for Current Functional Status.   Therapy/Group: Individual Therapy  Luberta Mutter 06/07/2015, 10:28 AM

## 2015-06-07 NOTE — Progress Notes (Signed)
Occupational Therapy Session Note  Patient Details  Name: Jerry [name] MRN: [id number] Date of Birth: Jul 30, 1951  Today's Date: 06/07/2015 OT Individual Time: 9169-4503 and 1415-1500 OT Individual Time Calculation (min): 60 min and 45 min   Short Term Goals: Week 1:  OT Short Term Goal 1 (Week 1): Pt will don pants with S using reacher as necessary. OT Short Term Goal 2 (Week 1): Pt will toilet with S. OT Short Term Goal 3 (Week 1): Pt will direct care givers to set up foot bath soak. OT Short Term Goal 4 (Week 1): Pt will be able to make a sandwich with S.  Skilled Therapeutic Interventions/Progress Updates:    1) ADL retraining with focus on bathroom transfers, standing balance, and increased independence with self-care tasks.  Pt finishing breakfast upon arrival.  Educated pt on energy conservation strategies with meal prep as pt reporting he did majority of cooking and worried he wouldn't be able to return to tasks.  Ambulated to bathroom with RW and supervision, voiding in standing with supervision.  UB bathing and dressing completed at sink with setup assist and required assist to tie shoes.    2) Treatment session with focus on activity tolerance and overall strengthening.  Pt ambulated approx 180 feet to therapy gym with RW and supervision.  Required prolonged seated rest break post ambulation.  Engaged in sit > stand from therapy mat without UE use, progressed to sit > stand completing 10 sit > stand with chest press in standing while holding 4# medicine ball.  Reaching task in sitting with pt retrieving items from floor to maintain sitting balance while completing simulated LB self-care tasks.    Therapy Documentation Precautions:  Precautions Precautions: Fall Precaution Comments: 2 drains and VAC; significant LE edema-keep elevated; HOB >45 deg Required Braces or Orthoses: Other Brace/Splint Other Brace/Splint: abdominal binder Restrictions Weight Bearing Restrictions:  No General:   Vital Signs: Therapy Vitals Temp: 98.9 F (37.2 C) Temp Source: Oral Pulse Rate: 66 Resp: 17 BP: (!) 119/57 mmHg Patient Position (if appropriate): Lying Oxygen Therapy SpO2: 95 % O2 Device: Not Delivered Pain:  Pt with no c/o pain  See Function Navigator for Current Functional Status.   Therapy/Group: Individual Therapy  [name] 06/07/2015, 8:56 AM

## 2015-06-08 ENCOUNTER — Inpatient Hospital Stay (HOSPITAL_COMMUNITY): Payer: Medicare HMO | Admitting: Physical Therapy

## 2015-06-08 ENCOUNTER — Encounter (HOSPITAL_COMMUNITY): Payer: Self-pay

## 2015-06-08 ENCOUNTER — Inpatient Hospital Stay (HOSPITAL_COMMUNITY): Payer: Medicare HMO | Admitting: Occupational Therapy

## 2015-06-08 NOTE — Progress Notes (Signed)
Physical Therapy Weekly Progress Note  Patient Details  Name: Jerry [name] MRN: [id number] Date of Birth: 1951/04/02  Beginning of progress report period: June 01, 2015 End of progress report period: June 08, 2015  Today's Date: 06/08/2015 PT Individual Time: 1320-1420 PT Individual Time Calculation (min): 60 min   Patient has met 3 of 10 long term goals.  Short term goals not set due to estimated length of stay.  Pt is supervision level overall with mod I goals set for independence in home. Requires S currently due to impaired balance in standing with functional UE tasks and during transitions, however pt safety awareness and compensatory strategies are improving.  Patient continues to demonstrate the following deficits: decreased activity tolerance, strength deficits, balance impairments, decreased postural control and therefore will continue to benefit from skilled PT intervention to enhance overall performance with transfers, gait, stairs, home and community mobility.  See Patient's Care Plan for progression toward long term goals.  Patient progressing toward long term goals..  Continue plan of care.  Skilled Therapeutic Interventions/Progress Updates:    Pt received seated in recliner finishing lunch; declines to participate until he receives his salad dressing from cafeteria and is able to eat his salad. Retrieved dressing from cafeteria staff and left pt to eat, returned 15 min later and pt then agreeable to treatment. W/c propulsion x150' x2 trials with BUE/BLE for aerobic endurance and LE strengthening. Gait with RW x150' and S, min verbal cues for posture and RW management. Car transfer to pick-up truck height to prepare for son to take pt home at d/c; performed supervision. W/c <>furniture transfer also performed supervision and min cues for hand placement and safety. Gait on ramp and curb outdoors in community setting with supervision, verbal cues for sequencing and safety  with RW. Pt returned to room totalA due to fatigue; stand pivot to return to recliner and remained seated in recliner at completion of session, all needs in reach.  Therapy Documentation Precautions:  Precautions Precautions: Fall Precaution Comments: 2 drains and VAC; significant LE edema-keep elevated; HOB >45 deg Required Braces or Orthoses: Other Brace/Splint Other Brace/Splint: abdominal binder Restrictions Weight Bearing Restrictions: No Pain: Pain Assessment Pain Assessment: No/denies pain Pain Score: 0-No pain   See Function Navigator for Current Functional Status.  Therapy/Group: Individual Therapy  Luberta Mutter 06/08/2015, 2:22 PM

## 2015-06-08 NOTE — Patient Care Conference (Signed)
Inpatient RehabilitationTeam Conference and Plan of Care Update Date: 06/08/2015   Time: 11:20 AM    Patient Name: Jerry [name]      Medical Record Number: [id number]  Date of Birth: 1951/06/23 Sex: Male         Room/Bed: 4M09C/4M09C-01 Payor Info: Payor: [employer] MEDICARE / Plan: [place] HMO / Product Type: *No Product type* /    Admitting Diagnosis: debility  Admit Date/Time:  05/31/2015  6:16 PM Admission Comments: No comment available   Primary Diagnosis:  <principal problem not specified> Principal Problem: <principal problem not specified>  Patient Active Problem List   Diagnosis Date Noted  . Lumbar post-laminectomy syndrome 06/01/2015  . Debilitated 05/31/2015  . Tobacco abuse 04/18/2015  . Mass of chest wall, right   . Chest wall mass 03/17/2015  . Morbid obesity ([place]) 03/17/2015  . Cigarette smoker 03/17/2015  . CAD (coronary artery disease) 02/12/2011  . Edema 02/12/2011  . Chronic pain syndrome   . Degenerative joint disease of knee, right   . History of depression   . History of cholecystectomy     Expected Discharge Date: Expected Discharge Date: 06/11/15  Team Members Present: Physician leading conference: Dr. Alysia Penna Social Worker Present: Ovidio Kin, LCSW Nurse Present: Dorien Chihuahua, RN PT Present: Kem Parkinson, PT OT Present: [name], OT SLP Present: Windell Moulding, SLP PPS Coordinator present : Daiva Nakayama, RN, CRRN     Current Status/Progress Goal Weekly Team Focus  Medical   One drain out, will go home with other drain, fall , safety awareness  home with [facility]  improve endurance   Bowel/Bladder   Contintent of bladder, c/o of constipation; LBM 06/08/15 with suppository  Continent of bowel and bladder, with regular pattern of BM and limited c/o constipation  Educate s/s of constipation   Swallow/Nutrition/ Hydration     na        ADL's   Supervision overall  Mod I overall  activity tolerance, LB self-care tasks,  standing tolerance/balance, pt/family education   Mobility   mod I bed mobility, supervision transfers, gait with RW x190'  Mod I overall except supervision car, stairs, outside gait  activity tolerance, gait and transfers with RW, standing balance   Communication     na        Safety/Cognition/ Behavioral Observations    no unsafe behaviors        Pain   No c/o of pain; Morphine pump in place for chronic back pain  <3 on a 0-10 scale  assess pain q 4hr and prn   Skin   Skin tears to Right and Left hands. Abrasion to bridge of nose. JP drain to LRQ. Midline chest incision with sutures. Incision from mid chest right lateral side of chest with sutures. Previous chest tube site with suture.  Remain free from additional breakdown while on rehab  assess skin q shift and prn      *See Care Plan and progress notes for long and short-term goals.  Barriers to Discharge: still with poor awareness    Possible Resolutions to Barriers:  cont rehab reinforce safety awareness    Discharge Planning/Teaching Needs:  Home with son who is taking off one week and then will have daughter's to assist also.      Team Discussion:  Making good progress-activity tolerance and endurance improved. One drain removed the other will be upon follow up at MD office. Will reach mod/i-supervision level goals by Friday. Son to come in  for education  Revisions to Treatment Plan:  None   Continued Need for Acute Rehabilitation Level of Care: The patient requires daily medical management by a physician with specialized training in physical medicine and rehabilitation for the following conditions: Daily direction of a multidisciplinary physical rehabilitation program to ensure safe treatment while eliciting the highest outcome that is of practical value to the patient.: Yes Daily medical management of patient stability for increased activity during participation in an intensive rehabilitation regime.: Yes Daily analysis  of laboratory values and/or radiology reports with any subsequent need for medication adjustment of medical intervention for : Neurological problems  Maryl Blalock, Gardiner Rhyme 06/09/2015, 2:58 PM

## 2015-06-08 NOTE — Progress Notes (Signed)
Physical Therapy Session Note  Patient Details  Name: Jerry [name] MRN: [id number] Date of Birth: August 15, 1950  Today's Date: 06/08/2015 PT Individual Time: 0805-0900 PT Individual Time Calculation (min): 55 min   Short Term Goals: Week 1:  PT Short Term Goal 1 (Week 1): = LTG due to short LOS  Skilled Therapeutic Interventions/Progress Updates:    Pt received seated on EOB with no c/o pain and agreeable to treatment. TED hose donned with totalA, shoes donned with S, however totalA for tieing shoe laces. Gait with RW into bathroom and standing balance at toilet with close S. W/c propulsion with BUEs for aerobic endurance, UE strengthening. Nustep x15 min with BUE/BLE for aerobic endurance, UE/LE strengthening for carryover into functional gait activities and mobility within the home and community. W/c propulsion to room with supervision. Gait with RW into bathroom for transfer, S overall. Remained seated in recliner with all needs in reach.    Therapy Documentation Precautions:  Precautions Precautions: Fall Precaution Comments: 2 drains and VAC; significant LE edema-keep elevated; HOB >45 deg Required Braces or Orthoses: Other Brace/Splint Other Brace/Splint: abdominal binder Restrictions Weight Bearing Restrictions: No Pain: Pain Assessment Pain Assessment: No/denies pain Pain Score: 0-No pain   See Function Navigator for Current Functional Status.   Therapy/Group: Individual Therapy  Luberta Mutter 06/08/2015, 8:54 AM

## 2015-06-08 NOTE — Progress Notes (Signed)
Plastic Surgery follow up       S/P chest wall reconstruction with local tissue advancement and placement of Flex HD- POD #19      Subjective: Patient reports he is doing well with rehab.  The #1 lateral JP drain is draining very little at this time and was removed without difficulty.  The #2 medial JP drain is draining serosanguinous clear fluid. The patient states that it did drain a very large amount earlier this week when he walked more than 1100 feet.  Will leave the #2 drain in place and if he is discharged before next Monday, we will plan to see him in follow up in the office next Tuesday or Friday.   Objective: Vital signs in last 24 hours: Temp:  [97.9 F (36.6 C)-98.9 F (37.2 C)] 98.9 F (37.2 C) (11/02 0500) Pulse Rate:  [72-88] 72 (11/02 0500) Resp:  [18] 18 (11/02 0500) BP: (130-143)/(58-69) 130/58 mmHg (11/02 0500) SpO2:  [98 %-99 %] 98 % (11/02 0500) Last BM Date: 06/06/15  Intake/Output from previous day: 11/01 0701 - 11/02 0700 In: 840 [P.O.:840] Out: 30 [Drains:30] Intake/Output this shift:     Lab Results:  No results for input(s): WBC, HGB, HCT, PLT in the last 72 hours. BMET  Recent Labs  06/07/15 0600  CREATININE 0.86   PT/INR No results for input(s): LABPROT, INR in the last 72 hours. ABG No results for input(s): PHART, HCO3 in the last 72 hours.  Invalid input(s): PCO2, PO2  Studies/Results: No results found.  Anti-infectives: Anti-infectives    None      Assessment/Plan:   S/P chest wall reconstruction with local tissue advancement and placement of Flex HD- POD #19 #1 JP drain removed.   #2 JP drain left in place. If discharged before next Monday, can see [place] in the office in follow up next Tuesday or Friday. Please have him call the office to arrange an appointment. [phone]  LOS: 8 days    RAYBURN,SHAWN,PA-C Plastic Surgery [phone]

## 2015-06-08 NOTE — Progress Notes (Signed)
Social Work Patient ID: Jerry [name], male   DOB: 10-23-1950, 64 y.o.   MRN: [id number] Met with pt to discuss team conference goals-supervision/mod/i level and discharge 11/5.  He feels he will be ready to go by then. Discussed home health follow up and he can't remember the Agency he had but he liked them.  Will try to figure it out and make a referral.  Will see if son can come in prior to discharge for any education needed to be done prior to Sat.

## 2015-06-08 NOTE — Progress Notes (Signed)
Occupational Therapy Weekly Progress Note  Patient Details  Name: Jerry [name] MRN: [id number] Date of Birth: 07-Jan-1951  Beginning of progress report period: June 01, 2015 End of progress report period: June 08, 2015  Today's Date: 06/08/2015 OT Individual Time: 0935-1100 OT Individual Time Calculation (min): 85 min    Patient has met 4 of 4 short term goals.  Pt is currently supervision overall with self-care tasks and household distance ambulation with RW.  Pt requires prolonged seated rest breaks during functional tasks.    Patient continues to demonstrate the following deficits: decreased activity tolerance and endurance, generalized overall weakness and therefore will continue to benefit from skilled OT intervention to enhance overall performance with BADL, iADL and Reduce care partner burden.  Patient progressing toward long term goals..  Continue plan of care.  OT Short Term Goals Week 1:  OT Short Term Goal 1 (Week 1): Pt will don pants with S using reacher as necessary. OT Short Term Goal 1 - Progress (Week 1): Met OT Short Term Goal 2 (Week 1): Pt will toilet with S. OT Short Term Goal 2 - Progress (Week 1): Met OT Short Term Goal 3 (Week 1): Pt will direct care givers to set up foot bath soak. OT Short Term Goal 3 - Progress (Week 1): Met OT Short Term Goal 4 (Week 1): Pt will be able to make a sandwich with S. OT Short Term Goal 4 - Progress (Week 1): Met Week 2:  OT Short Term Goal 1 (Week 2): STG = LTGs due to remaining LOS  Skilled Therapeutic Interventions/Progress Updates:    ADL retraining with focus on functional mobility, standing tolerance, and home making tasks.  Pt completed bathing and dressing at sit > stand level at sink, demonstrating improved standing balance while washing perineal area and pulling pants over hips.  Re-educated on Mod I level goals and expectations upon d/c home.  Engaged in home making tasks in ADL kitchen with focus on safety  with RW in kitchen while accessing items for simple meal prep and problem solving transporting items.  Issued pt a walker bag to increase safety with maintaining appropriate hand placement while transporting items.  Pt also reports he may have access to a walker tray.  Ambulated through ADL apartment with pt able to access items from refrigerator and cabinets and over uneven surfaces and carpet with distant supervision.  Therapy Documentation Precautions:  Precautions Precautions: Fall Precaution Comments: 2 drains and VAC; significant LE edema-keep elevated; HOB >45 deg Required Braces or Orthoses: Other Brace/Splint Other Brace/Splint: abdominal binder Restrictions Weight Bearing Restrictions: No General:   Vital Signs: Therapy Vitals Temp: 98.9 F (37.2 C) Temp Source: Oral Pulse Rate: 72 Resp: 18 BP: (!) 130/58 mmHg Patient Position (if appropriate): Lying Oxygen Therapy SpO2: 98 % O2 Device: Not Delivered Pain:  Pt with no c/o pain  See Function Navigator for Current Functional Status.   Therapy/Group: Individual Therapy  [name] 06/08/2015, 7:23 AM

## 2015-06-08 NOTE — Progress Notes (Signed)
Subjective/Complaints: Pt constipated wants supp this am  ROS: Pt denies fever, rash/itching, headache, blurred or double vision, nausea, vomiting, abdominal pain, diarrhea, chest pain, shortness of breath, palpitations, dysuria, dizziness,    Objective: Vital Signs: Blood pressure 130/58, pulse 72, temperature 98.9 F (37.2 C), temperature source Oral, resp. rate 18, height 6' (1.829 m), weight 111.857 kg (246 lb 9.6 oz), SpO2 98 %. No results found. Results for orders placed or performed during the hospital encounter of 05/31/15 (from the past 72 hour(s))  Creatinine, serum     Status: None   Collection Time: 06/07/15  6:00 AM  Result Value Ref Range   Creatinine, Ser 0.86 0.61 - 1.24 mg/dL   GFR calc non Af Amer >60 >60 mL/min   GFR calc Af Amer >60 >60 mL/min    Comment: (NOTE) The eGFR has been calculated using the CKD EPI equation. This calculation has not been validated in all clinical situations. eGFR's persistently <60 mL/min signify possible Chronic Kidney Disease.      HEENT: normal Cardio: RRR Resp: CTA B/L and unlabored GI: BS positive and NT, ND Extremity:  Edema 2+ Bilateral pretib Skin:   Wound Right chest drain sites, midline incision healing well. Abrasions upper ext -small 1cm lac on bridge of nose without bleeding. Left hand with two areas with larger skin tears---area close to left 5th mcp joint still bleeding slightly. Area closer to left wrist with minimal bleeding at this point.  Neuro: Alert/Oriented, Normal Sensory and Abnormal Motor 4/5 bilateral delt, bi, tri, grip, HF, KE, ADF Musc/Skel:  LB tender and Other no pain with UE or LE AROM Gen NAD   Assessment/Plan: 1. 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 Physiatrist is providing close team supervision and 24 hour management of active medical  problems listed below. Physiatrist and rehab team continue to assess barriers to discharge/monitor patient progress toward functional and medical goals. FIM: Function - Bathing Position: Wheelchair/chair at sink Body parts bathed by patient: Right arm, Left arm, Chest (only completed UB bathing) Body parts bathed by helper: Back Bathing not applicable: Abdomen, Right lower leg, Left lower leg, Left upper leg, Front perineal area, Buttocks, Right upper leg Assist Level: Supervision or verbal cues  Function- Upper Body Dressing/Undressing What is the patient wearing?: Pull over shirt/dress Pull over shirt/dress - Perfomed by patient: Thread/unthread right sleeve, Thread/unthread left sleeve, Put head through opening, Pull shirt over trunk Assist Level: Set up Set up : To obtain clothing/put away Function - Lower Body Dressing/Undressing What is the patient wearing?: Pants, Maryln Manuel, Shoes, Underwear Position: Education officer, museum at [employer] - Performed by patient: Thread/unthread right underwear leg, Thread/unthread left underwear leg, Pull underwear up/down Underwear - Performed by helper: Thread/unthread right underwear leg, Thread/unthread left underwear leg Pants- Performed by patient: Pull pants up/down, Thread/unthread right pants leg, Thread/unthread left pants leg Pants- Performed by helper: Thread/unthread right pants leg, Thread/unthread left pants leg Non-skid slipper socks- Performed by helper: Don/doff right sock, Don/doff left sock Shoes - Performed by patient: Don/doff right shoe, Don/doff left shoe Shoes - Performed by helper: Fasten right, Fasten left TED Hose - Performed by helper: Don/doff right TED hose, Don/doff left TED hose Assist for footwear: Partial/moderate assist Assist for lower body dressing: Touching or steadying assistance (Pt > 75%)  Function - Toileting Toileting steps completed by patient: Adjust clothing prior to toileting, Performs  perineal hygiene,  Adjust clothing after toileting Toileting steps completed by helper: Adjust clothing prior to toileting, Adjust clothing after toileting Toileting Assistive Devices: Grab bar or rail Assist level: Supervision or verbal cues  Function - Toilet Transfers Toilet transfer assistive device: [place] level to toilet: Supervision or verbal cues Assist level from toilet: Supervision or verbal cues  Function - Chair/bed transfer Chair/bed transfer method: Stand pivot Chair/bed transfer assist level: Supervision or verbal cues Chair/bed transfer assistive device: Armrests Chair/bed transfer details: Verbal cues for sequencing  Function - Locomotion: Wheelchair Will patient use wheelchair at discharge?: No Type: Manual Max wheelchair distance: 150 Assist Level: Supervision or verbal cues Assist Level: Supervision or verbal cues Assist Level: Supervision or verbal cues Turns around,maneuvers to table,bed, and toilet,negotiates 3% grade,maneuvers on rugs and over doorsills: No Function - Locomotion: Ambulation Assistive device: Walker-rolling Max distance: 190 Assist level: Supervision or verbal cues Assist level: Supervision or verbal cues Assist level: Supervision or verbal cues Walk 150 feet activity did not occur: Safety/medical concerns (fatigue) Assist level: Supervision or verbal cues Assist level: Touching or steadying assistance (Pt > 75%)  Function - Comprehension Comprehension: Auditory Comprehension assist level: Follows complex conversation/direction with no assist  Function - Expression Expression: Verbal Expression assist level: Expresses complex ideas: With no assist  Function - Social Interaction Social Interaction assist level: Interacts appropriately with others - No medications needed.  Function - Problem Solving Problem solving assist level: Solves complex problems: Recognizes & self-corrects  Function - Memory Memory assist level: Complete Independence:  No helper Patient normally able to recall (first 3 days only): Current season, Location of own room, Staff names and faces, That he or she is in a hospital                        Problem List and Plan: 1. Functional deficits secondary to deconditioning/cartilage in this chest wall mass, path showing osteosarcoma CVTS aware and have ordered 2nd opinion pathology eval from [facility], pt is aware of this, tumor margins were clean  2.  DVT Prophylaxis/Anticoagulation: Subcutaneous Lovenox. Monitor platelet counts and any signs of bleeding, latest 259K 3. Pain Management/chronic pain syndrome/multiple back surgeries: off po Oxycodone ,   Valium as needed/chronic pain pump refilled 05/27/2015 4. Acute blood loss anemia. Latest hemoglobin 9.1.  5. Neuropsych: This patient is capable of making decisions on his own behalf. 6. Skin/Wound Care: local care to nose, left hand after fall. Downsize dressing slightly for left hand so that he can use functionally.  Drains x 2 with minimal output, ask CVTS to assess for removal 7. Fluids/Electrolytes/Nutrition: Routine I&O with follow-up chemistries, 50-100% meals    Good po fluid intake   8. CAD status post PTCA.  resume aspirin 81 mg daily at some point. No chest pain or shortness of breath 9. Tobacco abuse. Counseling 10. Hypertension.Controlled Lasix 40 mg daily, 130/58 11. Bouts of urinary retention. Placed on Flomax. Voiding well 12.  Peripheral edema, elevate legs, compression hose,Lasix  protein supplementation--appears stable to improved 13.  Hypoalb- beneprotein, should help with wound healing and peripheral edema 14.  Constipation - .  improved off oral narcotics and laxatives 15. Fall---appears to have suffered just skin lacerations and mild knee contusions-- Neurologically stable, remainder of exam stable  -pt remains a fall risk--will need contact assist through toileting and hygiene tasks if he's standing up, at least for now.  LOS (Days) 8 A FACE TO FACE  EVALUATION WAS PERFORMED  Alysia Penna E 06/08/2015, 7:05 AM

## 2015-06-09 ENCOUNTER — Inpatient Hospital Stay (HOSPITAL_COMMUNITY): Payer: Medicare HMO | Admitting: Occupational Therapy

## 2015-06-09 ENCOUNTER — Inpatient Hospital Stay (HOSPITAL_COMMUNITY): Payer: Medicare HMO | Admitting: Physical Therapy

## 2015-06-09 LAB — BASIC METABOLIC PANEL
Anion gap: 10 (ref 5–15)
BUN: 6 mg/dL (ref 6–20)
CHLORIDE: 99 mmol/L — AB (ref 101–111)
CO2: 25 mmol/L (ref 22–32)
CREATININE: 0.9 mg/dL (ref 0.61–1.24)
Calcium: 8.9 mg/dL (ref 8.9–10.3)
GFR calc Af Amer: >60 mL/min (ref >60)
GFR calc non Af Amer: >60 mL/min (ref >60)
GLUCOSE: 162 mg/dL — AB (ref 65–99)
Potassium: 3.9 mmol/L (ref 3.5–5.1)
Sodium: 134 mmol/L — ABNORMAL LOW (ref 135–145)

## 2015-06-09 NOTE — Progress Notes (Signed)
Subjective/Complaints: Drain removal per Plastic surgery yesterday Asking about why he is on so much KCL  ROS: Pt denies fever, rash/itching, headache, blurred or double vision, nausea, vomiting, abdominal pain, diarrhea, chest pain, shortness of breath, palpitations, dysuria, dizziness,    Objective: Vital Signs: Blood pressure 127/60, pulse 71, temperature 98.9 F (37.2 C), temperature source Oral, resp. rate 16, height 6' (1.829 m), weight 117.799 kg (259 lb 11.2 oz), SpO2 96 %. No results found. Results for orders placed or performed during the hospital encounter of 05/31/15 (from the past 72 hour(s))  Creatinine, serum     Status: None   Collection Time: 06/07/15  6:00 AM  Result Value Ref Range   Creatinine, Ser 0.86 0.61 - 1.24 mg/dL   GFR calc non Af Amer >60 >60 mL/min   GFR calc Af Amer >60 >60 mL/min    Comment: (NOTE) The eGFR has been calculated using the CKD EPI equation. This calculation has not been validated in all clinical situations. eGFR's persistently <60 mL/min signify possible Chronic Kidney Disease.      HEENT: normal Cardio: RRR Resp: CTA B/L and unlabored GI: BS positive and NT, ND Extremity:  Edema 2+ Bilateral pretib Skin:   Wound Right chest drain sites, midline incision healing well. Abrasions upper ext -small 1cm lac on bridge of nose without bleeding. Left hand with two areas with larger skin tears---area close to left 5th mcp joint still bleeding slightly. Area closer to left wrist with minimal bleeding at this point.  Neuro: Alert/Oriented, Normal Sensory and Abnormal Motor 4/5 bilateral delt, bi, tri, grip, HF, KE, ADF Musc/Skel:  LB tender and Other no pain with UE or LE AROM Gen NAD   Assessment/Plan: 1. 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 Physiatrist is providing close team  supervision and 24 hour management of active medical problems listed below. Physiatrist and rehab team continue to assess barriers to discharge/monitor patient progress toward functional and medical goals. FIM: Function - Bathing Position: Wheelchair/chair at sink Body parts bathed by patient: Right arm, Left arm, Chest, Front perineal area, Buttocks, Right upper leg, Left upper leg Body parts bathed by helper: Back Bathing not applicable: Left lower leg, Right lower leg, Abdomen (declined doffing TEDS) Assist Level: Set up, Supervision or verbal cues  Function- Upper Body Dressing/Undressing What is the patient wearing?: Pull over shirt/dress Pull over shirt/dress - Perfomed by patient: Thread/unthread right sleeve, Thread/unthread left sleeve, Put head through opening, Pull shirt over trunk Assist Level: Set up Set up : To obtain clothing/put away Function - Lower Body Dressing/Undressing What is the patient wearing?: Pants, Underwear Position: Wheelchair/chair at sink Underwear - Performed by patient: Thread/unthread right underwear leg, Thread/unthread left underwear leg, Pull underwear up/down Underwear - Performed by helper: Thread/unthread right underwear leg, Thread/unthread left underwear leg Pants- Performed by patient: Pull pants up/down, Thread/unthread right pants leg, Thread/unthread left pants leg Pants- Performed by helper: Thread/unthread right pants leg, Thread/unthread left pants leg Non-skid slipper socks- Performed by helper: Don/doff right sock, Don/doff left sock Shoes - Performed by patient: Don/doff right shoe, Don/doff left shoe Shoes - Performed by helper: Fasten right, Fasten left TED Hose - Performed by helper: Don/doff right TED hose, Don/doff left TED hose Assist for footwear: Partial/moderate assist Assist for lower body dressing: Set up, Supervision or verbal cues  Function - Toileting Toileting steps completed by patient:  Adjust clothing prior to  toileting, Performs perineal hygiene, Adjust clothing after toileting Toileting steps completed by helper: Adjust clothing prior to toileting, Adjust clothing after toileting [place]: Grab bar or rail Assist level: Supervision or verbal cues  Function - Toilet Transfers Toilet transfer assistive device: [place] level to toilet: Supervision or verbal cues Assist level from toilet: Supervision or verbal cues  Function - Chair/bed transfer Chair/bed transfer method: Stand pivot, Ambulatory Chair/bed transfer assist level: Supervision or verbal cues Chair/bed transfer assistive device: Armrests, Walker Chair/bed transfer details: Verbal cues for sequencing  Function - Locomotion: Wheelchair Will patient use wheelchair at discharge?: No Type: Manual Max wheelchair distance: 150 Assist Level: Supervision or verbal cues Assist Level: Supervision or verbal cues Assist Level: Supervision or verbal cues Turns around,maneuvers to table,bed, and toilet,negotiates 3% grade,maneuvers on rugs and over doorsills: No Function - Locomotion: Ambulation Assistive device: Walker-rolling Max distance: 160 Assist level: Supervision or verbal cues Assist level: Supervision or verbal cues Assist level: Supervision or verbal cues Walk 150 feet activity did not occur: Safety/medical concerns (fatigue) Assist level: Supervision or verbal cues Assist level: Touching or steadying assistance (Pt > 75%)  Function - Comprehension Comprehension: Auditory Comprehension assist level: Follows complex conversation/direction with no assist  Function - Expression Expression: Verbal Expression assist level: Expresses complex ideas: With no assist  Function - Social Interaction Social Interaction assist level: Interacts appropriately with others - No medications needed.  Function - Problem Solving Problem solving assist level: Solves complex problems: Recognizes &  self-corrects  Function - Memory Memory assist level: Complete Independence: No helper Patient normally able to recall (first 3 days only): Current season, Location of own room, Staff names and faces, That he or she is in a hospital                        Problem List and Plan: 1. Functional deficits secondary to deconditioning/cartilage in this chest wall mass, path showing osteosarcoma CVTS aware and have ordered 2nd opinion pathology eval from [facility],discussed D/C date of 11/5 2.  DVT Prophylaxis/Anticoagulation: Subcutaneous Lovenox. Monitor platelet counts and any signs of bleeding, latest 259K 3. Pain Management/chronic pain syndrome/multiple back surgeries: off po Oxycodone ,   Valium as needed/chronic pain pump refilled 05/27/2015 4. Acute blood loss anemia. Latest hemoglobin 9.1.will recheck prior to D/C 5. Neuropsych: This patient is capable of making decisions on his own behalf. 6. Skin/Wound Care: local care to nose, left hand after fall. Downsize dressing slightly for left hand so that he can use functionally.  Drainsx1 , removal of remaining drain next week by plastics 7. Fluids/Electrolytes/Nutrition: Routine I&O with follow-up chemistries, 50-100% meals    Good po fluid intake   8. CAD status post PTCA.  resume aspirin 81 mg daily at some point. No chest pain or shortness of breath 9. Tobacco abuse. Counseling 10. Hypertension.Controlled Lasix 40 mg daily, 130/58 11. Bouts of urinary retention. Placed on Flomax. Voiding well 12.  Peripheral edema, elevate legs, compression hose,Lasix  protein supplementation--appears stable to improved 13.  Hypoalb- beneprotein, should help with wound healing and peripheral edema 14.  Constipation - .  improved off oral narcotics and laxatives 15. Fall---appears to have suffered just skin lacerations and mild knee contusions-- Neurologically stable, remainder of exam stable  -pt remains a fall risk--will need contact assist through toileting and  hygiene tasks if he's standing up, at least for now.  16.  HypoK usually took only 86mq  KCL at home, discussed diet and fluid output.  LOS (Days) 9 A FACE TO FACE EVALUATION WAS PERFORMED  Pawel Soules E 06/09/2015, 7:16 AM

## 2015-06-09 NOTE — Progress Notes (Signed)
Physical Therapy Session Note  Patient Details  Name: Jerry [name] MRN: [id number] Date of Birth: 28-Feb-1951  Today's Date: 06/09/2015 PT Individual Time: 0800-0900 PT Individual Time Calculation (min): 60 min   Short Term Goals: Week 2:  PT Short Term Goal 1 (Week 2): = LTG due to estimated length of stay  Skilled Therapeutic Interventions/Progress Updates:    0800-0900: Pt received seated in recliner, no c/o pain and agreeable to treatment. TEDs donned totalA, shoes donned with laces already tied with supervision. Gait with RW x150' to simulated apartment, supervision. Furniture transfer with mod I, no cues needed and no LOB. Gait to gym with RW and S x50'. Standing LE strengthening exercises in parallel bars including hip flexion marching, hamstring curl, hip abduction, heel raises, and mini squats. Requires seated rest break between each exercise. Educated pt in hamstring stretch with leg lifter, discussed use of belt or towel at home, and performed 2 sets x30 sec each on BLE. W/c propulsion to return to room with BLEs for hamstring strengthening. Stand pivot w/c >recliner with S. Remained seated in recliner with all needs in reach at completion of session.   1100-1130: Pt received seated in recliner, no c/o pain and agreeable to treatment. States he is fatigued after recent OT session but willing to do light exercise. W/c propulsion with BLEs for LE strengthening and endurance. Nustep x12 min with BUE/BLE on level 4 with average 35 steps/min for increased strength in upper/lower extremities, aerobic endurance. Returned to room in w/c with LE propulsion as above. Stand pivot to return to recliner; remained seated in recliner with all needs in reach at completion of session.   1300-1400: Pt received seated in recliner with daughter present; no c/o pain and agreeable to treatment. Performed w/c propulsion with BLEs x200' with occasional short rest breaks due to fatigue. Gait outdoors for 3  trials of 75-100' on unlevel surfaces including incline/declines with supervision; seated rest breaks between trials due to fatigue. Bathroom transfer with RW and supervision. Standing balance without UE support with dynamic UE reaching to match cards on board overhead in front of pt, repetitive sit <>stand with each card. Returned to room in w/c with totalA, returned to recliner with S. Remained seated in recliner with all needs in reach at completion of session.   Therapy Documentation Precautions:  Precautions Precautions: Fall Precaution Comments: 2 drains and VAC; significant LE edema-keep elevated; HOB >45 deg Required Braces or Orthoses: Other Brace/Splint Other Brace/Splint: abdominal binder Restrictions Weight Bearing Restrictions: No Pain: Pain Assessment Pain Assessment: No/denies pain Pain Score: 0-No pain   See Function Navigator for Current Functional Status.   Therapy/Group: Individual Therapy  Luberta Mutter 06/09/2015, 9:00 AM

## 2015-06-09 NOTE — Progress Notes (Signed)
Social Work Patient ID: Jerry [name], male   DOB: 1951/05/18, 64 y.o.   MRN: [id number] Met with pt to discuss could not find the home health agency that saw him before, he states: " It was a while ago."  He was agreeable to referral to [facility] for follow up therapies. He has all needed equipment. He wants to know if he can stay until 3:00 on Sat due to son working that day. Will check to make sure ok with MD.

## 2015-06-09 NOTE — Progress Notes (Signed)
Occupational Therapy Session Note  Patient Details  Name: Jerry [name] MRN: [id number] Date of Birth: Sep 20, 1950  Today's Date: 06/09/2015 OT Individual Time: 3151-7616 OT Individual Time Calculation (min): 75 min    Short Term Goals: Week 2:  OT Short Term Goal 1 (Week 2): STG = LTGs due to remaining LOS  Skilled Therapeutic Interventions/Progress Updates:    Pt seen for OT session focusing on household mobility and functional activity tolerance. Pt in recliner upon arrival with RN present administering medications. He declined bathing/dressing task, stating he had already gotten himself ready for the day. He ambulated throughout unit using RW with supervision. Focus of session on pt increasing functional ambulation distance and initiating use of rest breaks. He ambulated to ADL apartment where he practiced functional ambulation from low soft surface couch to/from refrigerator with items as he will do at home. Education provided regarding transporting items with RW, including RW bag and tray and use of plastic ware to ease transport of items. VCs provided for RW management when accessing refrigerator. He cont to ambulate throughout unit, taking extended seating rest break when available. Education provided during rest breaks regarding OT goals, POC, energy conservation, fall risk, decreasing fall risk, and d/c planning. He returned to room at end of session. He completed toileting task at overall supervision level. He was left sitting in recliner at end of session, all needs in reach.   Therapy Documentation Precautions:  Precautions Precautions: Fall Precaution Comments: 2 drains and VAC; significant LE edema-keep elevated; HOB >45 deg Required Braces or Orthoses: Other Brace/Splint Other Brace/Splint: abdominal binder Restrictions Weight Bearing Restrictions: No Pain:   No/ denies pain.   See Function Navigator for Current Functional Status.   Therapy/Group: Individual  Therapy  Lewis, Asante Ritacco C 06/09/2015, 7:15 AM

## 2015-06-10 ENCOUNTER — Inpatient Hospital Stay (HOSPITAL_COMMUNITY): Payer: Medicare HMO | Admitting: Occupational Therapy

## 2015-06-10 ENCOUNTER — Ambulatory Visit (HOSPITAL_COMMUNITY): Payer: Medicare HMO | Admitting: Physical Therapy

## 2015-06-10 ENCOUNTER — Inpatient Hospital Stay (HOSPITAL_COMMUNITY): Payer: Medicare HMO | Admitting: Physical Therapy

## 2015-06-10 DIAGNOSIS — R350 Frequency of micturition: Secondary | ICD-10-CM

## 2015-06-10 LAB — URINALYSIS, ROUTINE W REFLEX MICROSCOPIC
BILIRUBIN URINE: NEGATIVE
Glucose, UA: NEGATIVE mg/dL
KETONES UR: NEGATIVE mg/dL
NITRITE: POSITIVE — AB
Protein, ur: 30 mg/dL — AB
Specific Gravity, Urine: 1.011 (ref 1.005–1.030)
UROBILINOGEN UA: 1 mg/dL (ref 0.0–1.0)
pH: 7 (ref 5.0–8.0)

## 2015-06-10 LAB — URINE MICROSCOPIC-ADD ON

## 2015-06-10 MED ORDER — FUROSEMIDE 40 MG PO TABS: 40.0000 mg | ORAL_TABLET | Freq: Every day | ORAL | Status: DC

## 2015-06-10 MED ORDER — POTASSIUM CHLORIDE CRYS ER 20 MEQ PO TBCR
40.0000 meq | EXTENDED_RELEASE_TABLET | Freq: Every day | ORAL | Status: DC
  Administered 2015-06-10 – 2015-06-11 (×2): 40 meq via ORAL
  Filled 2015-06-10 (×2): qty 2

## 2015-06-10 MED ORDER — BUDESONIDE-FORMOTEROL FUMARATE 160-4.5 MCG/ACT IN AERO: 2.0000 | INHALATION_SPRAY | Freq: Every day | RESPIRATORY_TRACT | Status: DC

## 2015-06-10 MED ORDER — PANTOPRAZOLE SODIUM 40 MG PO TBEC: 40.0000 mg | DELAYED_RELEASE_TABLET | Freq: Two times a day (BID) | ORAL | Status: DC

## 2015-06-10 MED ORDER — SENNOSIDES-DOCUSATE SODIUM 8.6-50 MG PO TABS: 2.0000 | ORAL_TABLET | Freq: Two times a day (BID) | ORAL | Status: DC

## 2015-06-10 MED ORDER — OXYCODONE HCL 5 MG PO TABS: 5.0000 mg | ORAL_TABLET | ORAL | Status: DC | PRN

## 2015-06-10 MED ORDER — TAMSULOSIN HCL 0.4 MG PO CAPS: 0.4000 mg | ORAL_CAPSULE | Freq: Every day | ORAL | Status: DC

## 2015-06-10 MED ORDER — LOVASTATIN 20 MG PO TABS: 20.0000 mg | ORAL_TABLET | Freq: Every day | ORAL | Status: DC

## 2015-06-10 MED ORDER — ASPIRIN 81 MG PO CHEW
81.0000 mg | CHEWABLE_TABLET | Freq: Every day | ORAL | Status: DC
  Administered 2015-06-10 – 2015-06-11 (×2): 81 mg via ORAL
  Filled 2015-06-10 (×2): qty 1

## 2015-06-10 MED ORDER — POTASSIUM CHLORIDE CRYS ER 20 MEQ PO TBCR: 40.0000 meq | EXTENDED_RELEASE_TABLET | Freq: Every day | ORAL | Status: DC

## 2015-06-10 MED ORDER — DIAZEPAM 10 MG PO TABS: 10.0000 mg | ORAL_TABLET | Freq: Two times a day (BID) | ORAL | Status: DC | PRN

## 2015-06-10 NOTE — Progress Notes (Signed)
Physical Therapy Discharge Summary  Patient Details  Name: Jerry [name] MRN: [id number] Date of Birth: 1950/11/01  Today's Date: 06/10/2015 PT Individual Time: 0930-1000 and 1100-1200 and 1300-1330 PT Individual Time Calculation (min): 30 min and 60 min and 30 min (total 120 min)   Patient has met 10 of 10 long term goals due to improved activity tolerance, improved balance, improved postural control, increased strength and improved coordination.  Patient to discharge at an ambulatory level Supervision.   Patient's care partner is independent to provide the necessary physical assistance/supervision at discharge.  Reasons goals not met: All goals met  Recommendation:  Patient will benefit from ongoing skilled PT services in home health setting to continue to advance safe functional mobility, address ongoing impairments in strength, balance, activity tolerance, posture, and minimize fall risk.  Equipment: No equipment provided  Reasons for discharge: treatment goals met and discharge from hospital  Patient/family agrees with progress made and goals achieved: Yes  PT Discharge Precautions/Restrictions Precautions Precautions: Fall Precaution Comments: 1 drain Restrictions Weight Bearing Restrictions: No Vital Signs Oxygen Therapy SpO2: 99 % O2 Device: Not Delivered Pain Pain Assessment Pain Assessment: No/denies pain Pain Score: 0-No pain Vision/Perception  Perception Comments: WFL  Cognition Overall Cognitive Status: Within Functional Limits for tasks assessed Arousal/Alertness: Awake/alert Orientation Level: Oriented X4 Memory: Appears intact Awareness: Appears intact Problem Solving: Appears intact Safety/Judgment: Appears intact Sensation Sensation Light Touch: Appears Intact Proprioception: Appears Intact Coordination Gross Motor Movements are Fluid and Coordinated: Yes Fine Motor Movements are Fluid and Coordinated: No Motor  Motor Motor: Abnormal  postural alignment and control;Other (comment) Motor - Discharge Observations: generalized weakness, pre-morbid postural abnormalities from back surgeries, chronic pain  Mobility Bed Mobility Bed Mobility: Supine to Sit;Sit to Supine Supine to Sit: 6: Modified independent (Device/Increase time) Supine to Sit Details (indicate cue type and reason): with HOB elevated; as performed at home d/t back pain, unable to tolerate supine Sit to Supine: 6: Modified independent (Device/Increase time) Sit to Supine - Details (indicate cue type and reason): HOB elevated as performed at home d/t back pain, unable to tolerate supine Transfers Stand Pivot Transfers: 6: Modified independent (Device/Increase time) Locomotion  Ambulation Ambulation: Yes Ambulation/Gait Assistance: 6: Modified independent (Device/Increase time) Ambulation Distance (Feet): 200 Feet Assistive device: Rolling walker Gait Gait: Yes Gait Pattern: Impaired Gait Pattern: Step-through pattern;Lateral trunk lean to right;Decreased trunk rotation;Trunk flexed;Shuffle Gait velocity: 1.68 ft/sec (175' in 2 min) Stairs / Additional Locomotion Stairs: Yes Stairs Assistance: 5: Supervision Stairs Assistance Details: Verbal cues for gait pattern Stair Management Technique: Two rails;Step to pattern;Forwards;Backwards Number of Stairs: 4 Height of Stairs: 3 Ramp: 5: Supervision Curb: 5: Psychiatric nurse: Yes Wheelchair Assistance: 6: Modified independent (Device/Increase time) Environmental health practitioner: Both upper extremities;Both lower extermities Wheelchair Parts Management: Supervision/cueing Distance: 200  Trunk/Postural Assessment  Cervical Assessment Cervical Assessment: Exceptions to [facility] (forward head posture) Thoracic Assessment Thoracic Assessment: Within Functional Limits (increased thoracic kyphosis, R trunk shortened, L trunk lengthened) Lumbar Assessment Lumbar Assessment:  (posterior  pelvic tilt, reversal of lumbar lordosis) Postural Control Postural Control: Deficits on evaluation (decreased strength/power production for righting/stepping strategies)  Balance Balance Balance Assessed: Yes Standardized Balance Assessment Standardized Balance Assessment: Berg Balance Test Berg Balance Test Sit to Stand: Able to stand  independently using hands Standing Unsupported: Able to stand safely 2 minutes Sitting with Back Unsupported but Feet Supported on Floor or Stool: Able to sit safely and securely 2 minutes Stand to Sit: Controls descent by using hands  Transfers: Able to transfer safely, definite need of hands Standing Unsupported with Eyes Closed: Able to stand 10 seconds with supervision Standing Ubsupported with Feet Together: Able to place feet together independently and stand for 1 minute with supervision From Standing, Reach Forward with Outstretched Arm: Can reach forward >5 cm safely (2") From Standing Position, Pick up Object from Floor: Able to pick up shoe, needs supervision From Standing Position, Turn to Look Behind Over each Shoulder: Looks behind one side only/other side shows less weight shift Turn 360 Degrees: Needs close supervision or verbal cueing Standing Unsupported, Alternately Place Feet on Step/Stool: Needs assistance to keep from falling or unable to try Standing Unsupported, One Foot in Front: Able to plae foot ahead of the other independently and hold 30 seconds Standing on One Leg: Tries to lift leg/unable to hold 3 seconds but remains standing independently Total Score: 36 Dynamic Sitting Balance Dynamic Sitting - Level of Assistance: 6: Modified independent (Device/Increase time) Sitting balance - Comments: various ADLs in sitting, no assist or cueing needed Static Standing Balance Static Standing - Level of Assistance: 6: Modified independent (Device/Increase time) Dynamic Standing Balance Dynamic Standing - Level of Assistance: 5: Stand  by assistance Extremity Assessment  RUE Assessment RUE Assessment: Within Functional Limits LUE Assessment LUE Assessment: Within Functional Limits RLE Assessment RLE Assessment: Exceptions to [facility] (grossly 4/5 throughout) LLE Assessment LLE Assessment: Exceptions to [facility] (grossly 4/5 throughout)   0930-1000: Pt received seated in recliner, no c/o pain and agreeable to treatment. Stand pivot recliner>w/c with mod I. W/c propulsion with BLEs x150' x1 rep for LE strengthening and aerobic endurance. Short seated rest break while waiting for medication from RN. Performed propulsion with BUEs x300' including navigation around corners, carpeted surfaces, and thresholds. Returned to room and returned to w/c with mod I stand pivot.   1100-1200: Pt received seated in recliner, no c/o pain and agreeable to treatment. Pt reports having recently spoken with doctor regarding discharge info, reviewing medications and states he feels much more comfortable regarding meds, understands why he is on the medications he has been prescribed which had been a source of anxiety. W/c propulsion to therapy gym mod I. Stand pivot transfer w/c <> simulated car with S. Ascent/descent of 4 3-inch stairs with supervision and two handrails, forward ascent and backward descent; pt declines further attempts at stairs due to knee pain. Gait on level surface for 200'; completed 175' in 2 minute walk test, a significant improvement over eval distance of 50' 2MWT. Gait on unlevel surfaces x10' with close S. Berg balance scale administered; see above for details. Returned to room and transfer to return to [place] seated in recliner at completion of session, pt reporting feeling nauseous and left with trashcan and sprite per pt request.   7371-0626: Pt received seated in recliner, no c/o pain and agreeable to treatment. States nausea from earlier has subsided after eating lunch. Stand pivot recliner>w/c with mod I. W/c  propulsion x150' with BUEs/BLEs to therapy gym. Nustep x8 min on level 3 with average 36 steps/min for aerobic endurance and strengthening. In room, performed transfer w/c >bed including sit >supine with mod I. Remained supine in bed at completion of session, all needs in reach.   See Function Navigator for Current Functional Status.  Benjiman Core Tygielski 06/10/2015, 12:14 PM

## 2015-06-10 NOTE — Progress Notes (Signed)
Patient received discharge instructions from Marlowe Sh[place], PA-C with verbal understanding. Patient to be discharged to home 06/11/15 with 1 JP drain in place. Patient to manage JP drain at home until follow-up appt. at MD office. Patient received Flu vaccination PTA on rehab.

## 2015-06-10 NOTE — Progress Notes (Signed)
Occupational Therapy Discharge Summary  Patient Details  Name: Jerry [name] MRN: [id number] Date of Birth: 1951/01/28  Patient has met 9 of 9 long term goals due to improved activity tolerance, improved balance and ability to compensate for deficits.  Patient to discharge at overall Modified Independent level.  Patient's care partner is independent to provide the necessary assistance at discharge.  [name] to take a week off from work upon d/c to assist pt with transition.  Reasons goals not met: N/A  Recommendation:  Patient will benefit from ongoing skilled OT services in home health setting to continue to advance functional skills in the area of BADL, iADL and Reduce care partner burden.  Equipment: No equipment provided  Reasons for discharge: treatment goals met and discharge from hospital  Patient/family agrees with progress made and goals achieved: Yes  OT Discharge Precautions/Restrictions  Precautions Precautions: Fall Precaution Comments: 1 drain Restrictions Weight Bearing Restrictions: No General   Vital Signs Oxygen Therapy SpO2: 99 % O2 Device: Not Delivered Pain Pain Assessment Pain Assessment: No/denies pain Pain Score: 0-No pain ADL  See Function Navigator Vision/Perception  Vision- History Baseline Vision/History: Wears glasses Wears Glasses: Reading only Patient Visual Report: No change from baseline Vision- Assessment Vision Assessment?: No apparent visual deficits Perception Comments: WFL  Cognition Overall Cognitive Status: Within Functional Limits for tasks assessed Arousal/Alertness: Awake/alert Orientation Level: Oriented X4 Memory: Appears intact Awareness: Appears intact Problem Solving: Appears intact Safety/Judgment: Appears intact Sensation Sensation Light Touch: Appears Intact Proprioception: Appears Intact Coordination Gross Motor Movements are Fluid and Coordinated: Yes Fine Motor Movements are Fluid and Coordinated:  No Motor  Motor Motor: Abnormal postural alignment and control;Other (comment) Motor - Discharge Observations: generalized weakness, pre-morbid postural abnormalities from back surgeries, chronic pain Mobility  Bed Mobility Bed Mobility: Supine to Sit;Sit to Supine Supine to Sit: 6: Modified independent (Device/Increase time) Supine to Sit Details (indicate cue type and reason): with HOB elevated; as performed at home d/t back pain, unable to tolerate supine Sit to Supine: 6: Modified independent (Device/Increase time) Sit to Supine - Details (indicate cue type and reason): HOB elevated as performed at home d/t back pain, unable to tolerate supine  Trunk/Postural Assessment  Cervical Assessment Cervical Assessment: Exceptions to [facility] (forward head posture) Thoracic Assessment Thoracic Assessment: Within Functional Limits (increased thoracic kyphosis, R trunk shortened, L trunk lengthened) Lumbar Assessment Lumbar Assessment:  (posterior pelvic tilt, reversal of lumbar lordosis) Postural Control Postural Control: Deficits on evaluation (decreased strength/power production for righting/stepping strategies)  Balance Balance Balance Assessed: Yes Standardized Balance Assessment Standardized Balance Assessment: Berg Balance Test Berg Balance Test Sit to Stand: Able to stand  independently using hands Standing Unsupported: Able to stand safely 2 minutes Sitting with Back Unsupported but Feet Supported on Floor or Stool: Able to sit safely and securely 2 minutes Stand to Sit: Controls descent by using hands Transfers: Able to transfer safely, definite need of hands Standing Unsupported with Eyes Closed: Able to stand 10 seconds with supervision Standing Ubsupported with Feet Together: Able to place feet together independently and stand for 1 minute with supervision From Standing, Reach Forward with Outstretched Arm: Can reach forward >5 cm safely (2") From Standing Position, Pick up Object  from Floor: Able to pick up shoe, needs supervision From Standing Position, Turn to Look Behind Over each Shoulder: Looks behind one side only/other side shows less weight shift Turn 360 Degrees: Needs close supervision or verbal cueing Standing Unsupported, Alternately Place Feet on Step/Stool: Needs assistance  to keep from falling or unable to try Standing Unsupported, One Foot in Front: Able to plae foot ahead of the other independently and hold 30 seconds Standing on One Leg: Tries to lift leg/unable to hold 3 seconds but remains standing independently Total Score: 36 Dynamic Sitting Balance Dynamic Sitting - Level of Assistance: 6: Modified independent (Device/Increase time) Sitting balance - Comments: various ADLs in sitting, no assist or cueing needed Static Standing Balance Static Standing - Level of Assistance: 6: Modified independent (Device/Increase time) Dynamic Standing Balance Dynamic Standing - Level of Assistance: 5: Stand by assistance Extremity/Trunk Assessment RUE Assessment RUE Assessment: Within Functional Limits LUE Assessment LUE Assessment: Within Functional Limits   See Function Navigator for Current Functional Status.  Jerry [name] 06/10/2015, 3:23 PM

## 2015-06-10 NOTE — Discharge Instructions (Signed)
Inpatient Rehab Discharge Instructions  Jerry [name] Discharge date and time: No discharge date for patient encounter.   Activities/Precautions/ Functional Status: Activity: activity as tolerated Diet: regular diet Wound Care: keep wound clean and dry Functional status:  ___ No restrictions     ___ Walk up steps independently _x__ 24/7 supervision/assistance   ___ Walk up steps with assistance ___ Intermittent supervision/assistance  ___ Bathe/dress independently ___ Walk with walker     ___ Bathe/dress with assistance ___ Walk Independently    ___ Shower independently _x__ Walk with assistance    ___ Shower with assistance ___ No alcohol     ___ Return to work/school ________  Special Instructions:  No smoking  Follow-up outpatient within the week for removal of JP drain with Dr. Marla Roe [phone]   COMMUNITY REFERRALS UPON DISCHARGE:    Home Health:   PT,RN,OT    [facility]   Date of last service:06/11/2015  Medical Equipment/Items Ordered:HAS ALL FROM PREVIOUS ADMITS   My questions have been answered and I understand these instructions. I will adhere to these goals and the provided educational materials after my discharge from the hospital.  Patient/Caregiver Signature _______________________________ Date __________  Clinician Signature _______________________________________ Date __________  Please bring this form and your medication list with you to all your follow-up doctor's appointments.

## 2015-06-10 NOTE — Progress Notes (Signed)
Occupational Therapy Session Note  Patient Details  Name: Jerry [name] MRN: [id number] Date of Birth: 1950/10/11  Today's Date: 06/10/2015 OT Individual Time: 8937-3428 and 1400-1430 OT Individual Time Calculation (min): 60 min and 30 min   Short Term Goals: Week 2:  OT Short Term Goal 1 (Week 2): STG = LTGs due to remaining LOS  Skilled Therapeutic Interventions/Progress Updates:    1) Completed ADL retraining at overall modified independent level from RW and sit > stand level.  Pt ambulated to sink with RW with clothing in walker bag to increase independence with self-care tasks. Bathing and dressing completed at sit > stand level at sink, pt reports plan to have son place chair in bathroom to complete bathing.  Pt required increased time and frequent rest breaks throughout session.  2) Treatment session with focus on functional mobility in home environment and activity tolerance.  Pt ambulated to ADL apt with RW at Mod I level, requiring prolonged seated rest break in apt.  Completed toilet transfer to [facility] over toilet with use of RW, pt reports having BSC over toilet at home. Returned to room and left seated upright in recliner with all needs in reach.  Therapy Documentation Precautions:  Precautions Precautions: Fall Precaution Comments: 1 drain Required Braces or Orthoses: Other Brace/Splint Other Brace/Splint: abdominal binder Restrictions Weight Bearing Restrictions: No General:   Vital Signs: Oxygen Therapy SpO2: 99 % O2 Device: Not Delivered Pain: Pain Assessment Pain Assessment: No/denies pain Pain Score: 0-No pain  See Function Navigator for Current Functional Status.   Therapy/Group: Individual Therapy  [name] 06/10/2015, 12:07 PM

## 2015-06-10 NOTE — Discharge Summary (Signed)
Discharge summary job [phone]

## 2015-06-10 NOTE — Progress Notes (Signed)
Subjective/Complaints: Freq urination, on episode of burning  ROS: Pt denies fever, rash/itching, headache, blurred or double vision, nausea, vomiting, abdominal pain, diarrhea, chest pain, shortness of breath, palpitations, dysuria, dizziness,    Objective: Vital Signs: Blood pressure 122/60, pulse 65, temperature 98.6 F (37 C), temperature source Oral, resp. rate 18, height 6' (1.829 m), weight 117.799 kg (259 lb 11.2 oz), SpO2 98 %. No results found. Results for orders placed or performed during the hospital encounter of 05/31/15 (from the past 72 hour(s))  Basic metabolic panel     Status: Abnormal   Collection Time: 06/09/15  8:04 AM  Result Value Ref Range   Sodium 134 (L) 135 - 145 mmol/L   Potassium 3.9 3.5 - 5.1 mmol/L   Chloride 99 (L) 101 - 111 mmol/L   CO2 25 22 - 32 mmol/L   Glucose, Bld 162 (H) 65 - 99 mg/dL   BUN 6 6 - 20 mg/dL   Creatinine, Ser 0.90 0.61 - 1.24 mg/dL   Calcium 8.9 8.9 - 10.3 mg/dL   GFR calc non Af Amer >60 >60 mL/min   GFR calc Af Amer >60 >60 mL/min    Comment: (NOTE) The eGFR has been calculated using the CKD EPI equation. This calculation has not been validated in all clinical situations. eGFR's persistently <60 mL/min signify possible Chronic Kidney Disease.    Anion gap 10 5 - 15     HEENT: normal Cardio: RRR Resp: CTA B/L and unlabored GI: BS positive and NT, ND Extremity:  Edema 2+ Bilateral pretib Skin:   RIght hand dorsum abrasion healing well, Left 5th digit with tape, tegaderm and gauze , pt doesn't tolerate attempts at removing will need to use scissors -small 1cm lac on bridge of nose without bleeding. Left hand with two areas with larger skin tears---area close to left 5th mcp joint still bleeding slightly. Area closer to left wrist with minimal bleeding at this point.  Neuro: Alert/Oriented, Normal Sensory and Abnormal Motor 4/5 bilateral delt, bi, tri, grip, HF, KE, ADF Musc/Skel:  LB tender and Other no pain with UE or  LE AROM Gen NAD   Assessment/Plan: 1. 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 Physiatrist is providing close team supervision and 24 hour management of active medical problems listed below. Physiatrist and rehab team continue to assess barriers to discharge/monitor patient progress toward functional and medical goals. Plan D/C in am FIM: Function - Bathing Position: Wheelchair/chair at sink Body parts bathed by patient: Right arm, Left arm, Chest, Front perineal area, Buttocks, Right upper leg, Left upper leg Body parts bathed by helper: Back Bathing not applicable: Left lower leg, Right lower leg, Abdomen (declined doffing TEDS) Assist Level: Set up, Supervision or verbal cues  Function- Upper Body Dressing/Undressing What is the patient wearing?: Pull over shirt/dress Pull over shirt/dress - Perfomed by patient: Thread/unthread right sleeve, Thread/unthread left sleeve, Put head through opening, Pull shirt over trunk Assist Level: Set up Set up : To obtain clothing/put away Function - Lower Body Dressing/Undressing What is the patient wearing?: Pants, Underwear Position: Wheelchair/chair at sink Underwear - Performed by patient: Thread/unthread right underwear leg, Thread/unthread left underwear leg, Pull underwear up/down Underwear - Performed by helper: Thread/unthread right underwear leg, Thread/unthread left underwear leg Pants- Performed by patient: Pull pants up/down, Thread/unthread right pants leg, Thread/unthread left pants leg Pants- Performed by helper: Thread/unthread  right pants leg, Thread/unthread left pants leg Non-skid slipper socks- Performed by helper: Don/doff right sock, Don/doff left sock Shoes - Performed by patient: Don/doff right shoe, Don/doff left shoe Shoes - Performed by helper: Fasten right, Fasten  left TED Hose - Performed by helper: Don/doff right TED hose, Don/doff left TED hose Assist for footwear: Partial/moderate assist Assist for lower body dressing: Set up, Supervision or verbal cues  Function - Toileting Toileting steps completed by patient: Adjust clothing prior to toileting, Performs perineal hygiene, Adjust clothing after toileting Toileting steps completed by helper: Adjust clothing prior to toileting, Adjust clothing after toileting Toileting Assistive Devices: Grab bar or rail Assist level: Supervision or verbal cues  Function - Air cabin crew transfer assistive device: Walker Assist level to toilet: Supervision or verbal cues Assist level from toilet: Supervision or verbal cues  Function - Chair/bed transfer Chair/bed transfer method: Stand pivot Chair/bed transfer assist level: Supervision or verbal cues Chair/bed transfer assistive device: Armrests Chair/bed transfer details: Verbal cues for sequencing  Function - Locomotion: Wheelchair Will patient use wheelchair at discharge?: No Type: Manual Max wheelchair distance: 150 Assist Level: No help, No cues, assistive device, takes more than reasonable amount of time Assist Level: No help, No cues, assistive device, takes more than reasonable amount of time Assist Level: No help, No cues, assistive device, takes more than reasonable amount of time Turns around,maneuvers to table,bed, and toilet,negotiates 3% grade,maneuvers on rugs and over doorsills: No Function - Locomotion: Ambulation Assistive device: Walker-rolling Max distance: 150 Assist level: Supervision or verbal cues Assist level: Supervision or verbal cues Assist level: Supervision or verbal cues Walk 150 feet activity did not occur: Safety/medical concerns (fatigue) Assist level: Supervision or verbal cues Assist level: Touching or steadying assistance (Pt > 75%)  Function - Comprehension Comprehension: Auditory Comprehension assist  level: Follows complex conversation/direction with no assist  Function - Expression Expression: Verbal Expression assist level: Expresses complex ideas: With no assist  Function - Social Interaction Social Interaction assist level: Interacts appropriately with others - No medications needed.  Function - Problem Solving Problem solving assist level: Solves complex problems: Recognizes & self-corrects  Function - Memory Memory assist level: Complete Independence: No helper Patient normally able to recall (first 3 days only): Current season, Location of own room, Staff names and faces, That he or she is in a hospital                        Problem List and Plan: 1. Functional deficits secondary to deconditioning/cartilage in this chest wall mass, path showing osteosarcoma CVTS aware and have ordered 2nd opinion pathology eval from [facility],f/u Dr Roxan Hockey 2.  DVT Prophylaxis/Anticoagulation: Subcutaneous Lovenox. Monitor platelet counts and any signs of bleeding, latest 259K 3. Pain Management/chronic pain syndrome/multiple back surgeries: off po Oxycodone ,   Valium as needed/chronic pain pump refilled 05/27/2015 4. Acute blood loss anemia. Latest hemoglobin 9.1.will recheck prior to D/C 5. Neuropsych: This patient is capable of making decisions on his own behalf. 6. Skin/Wound Care: local care to nose, left hand after fall. Downsize dressing slightly for left hand so that he can use functionally.  Drainsx1 , removal of remaining drain next week by plastics 7. Fluids/Electrolytes/Nutrition: Routine I&O with follow-up chemistries, 50-100% meals    Good po fluid intake   8. CAD status post PTCA.  resume aspirin 81 mg daily at some point. No chest pain or shortness of breath 9. Tobacco abuse. Counseling 10. Hypertension.Controlled Lasix  40 mg daily, 122/60 11. Bouts of urinary retention. Placed on Flomax. Voiding well 12.  Peripheral edema, elevate legs, compression hose,Lasix  protein  supplementation--appears stable to improved 13.  Hypoalb- beneprotein, should help with wound healing and peripheral edema 14.  Constipation - .  improved off oral narcotics and laxatives 15.  Urinary freq one episode of dysuria- check UA prior to d/c 16.  HypoK resolved LOS (Days) 10 A FACE TO FACE EVALUATION WAS PERFORMED  Rakesha Dalporto E 06/10/2015, 7:07 AM
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
## 2015-06-10 NOTE — Progress Notes (Signed)
Social Work  Discharge Note  The overall goal for the admission was met for:   Discharge location: Yes-HOME WITH SON WHO WILL TAKE OFF ONE WEEK AND THEN BE THERE IN THE EVENINGS.  DAUGHTER'S IN AND OUT  Length of Stay: Yes-11 DAYS  Discharge activity level: Yes-MOD/I LEVEL  Home/community participation: Yes  Services provided included: MD, RD, PT, OT, RN, CM, Pharmacy and SW  Financial Services: Private Insurance: [employer]  Follow-up services arranged: Home Health: [facility]-PT, OT, RN and Patient/Family has no preference for HH/DME agencies  Comments (or additional information):PT DID WELL HERE AND SON TO BE WITH FOR A WEEK THEN HIS DAUGHTER'S TO BE IN AND [place] EVENINGS  Patient/Family verbalized understanding of follow-up arrangements: Yes  Individual responsible for coordination of the follow-up plan: SCOOTER-SON & PATIENT  Confirmed correct DME delivered: Elease Hashimoto 06/10/2015    Elease Hashimoto

## 2015-06-11 DIAGNOSIS — D62 Acute posthemorrhagic anemia: Secondary | ICD-10-CM

## 2015-06-11 DIAGNOSIS — G894 Chronic pain syndrome: Secondary | ICD-10-CM

## 2015-06-11 DIAGNOSIS — Z720 Tobacco use: Secondary | ICD-10-CM

## 2015-06-11 DIAGNOSIS — I2510 Atherosclerotic heart disease of native coronary artery without angina pectoris: Secondary | ICD-10-CM

## 2015-06-11 DIAGNOSIS — N390 Urinary tract infection, site not specified: Secondary | ICD-10-CM

## 2015-06-11 MED ORDER — SULFAMETHOXAZOLE-TRIMETHOPRIM 800-160 MG PO TABS
1.0000 | ORAL_TABLET | Freq: Two times a day (BID) | ORAL | Status: DC
  Administered 2015-06-11: 1 via ORAL
  Filled 2015-06-11 (×3): qty 1

## 2015-06-11 MED ORDER — SULFAMETHOXAZOLE-TRIMETHOPRIM 800-160 MG PO TABS: 1.0000 | ORAL_TABLET | Freq: Two times a day (BID) | ORAL | Status: AC

## 2015-06-11 NOTE — Progress Notes (Signed)
Pt given discharge instructions and had no questions. Discharged with son via wheelchair with tech for assistance.

## 2015-06-11 NOTE — Progress Notes (Signed)
   Subjective/Complaints:  Lorelee Cover to go home today. Dysuria yesterday- better today  Objective: Vital Signs: Blood pressure 118/59, pulse 70, temperature 98.9 F (37.2 C), temperature source Oral, resp. rate 18, height 6' (1.829 m), weight 259 lb 11.2 oz (117.799 kg), SpO2 97 %.  Elderly Obese nad ches- cta cv- reg rate abd- overweight Ext- no edema  Assessment/Plan: 1. Functional deficits secondary to deconditioning after resection of chest wall tumor-                       Problem List and Plan: 1. Functional deficits secondary to deconditioning/cartilage in this chest wall mass, path showing osteosarcoma CVTS aware and have ordered 2nd opinion pathology eval from [facility],f/u Dr Roxan Hockey 2.  DVT Prophylaxis/Anticoagulation: going home 3. Pain Management/chronic pain syndrome/multiple back surgeries: home meds reviewed with pateint 4. Acute blood loss anemia. Latest hemoglobin 9.1.will recheck prior to D/C Lab Results  Component Value Date   HGB 9.1* 06/01/2015   5. Neuropsych: This patient is capable of making decisions on his own behalf. 6. Skin/Wound Care: patient understands wound care 7. Fluids/Electrolytes/Nutrition:  Basic Metabolic Panel:    Component Value Date/Time   NA 134* 06/09/2015 0804   K 3.9 06/09/2015 0804   CL 99* 06/09/2015 0804   CO2 25 06/09/2015 0804   BUN 6 06/09/2015 0804   CREATININE 0.90 06/09/2015 0804   GLUCOSE 162* 06/09/2015 0804   CALCIUM 8.9 06/09/2015 0804      8. CAD status post PTCA. - no sxs 9. Tobacco abuse. 10. Hypertension. 118/59-122/60 11. Bouts of urinary retention. resolved 12.  Peripheral edema, resolved 13.  Hypoalb- albu= 2.4 g/dl 14.  Constipation - .  iresolved 15.  Urinary freq - appears to have uti-(allergic to cipro)- start Septra for 7 days Results for TILTON, MARSALIS (MRN [id number]) as of 06/11/2015 06:34  Ref. Range 06/10/2015 11:06  Appearance Latest Ref Range: CLEAR  TURBID (A)  Bacteria, UA Latest Ref  Range: RARE  MANY (A)  Bilirubin Urine Latest Ref Range: NEGATIVE  NEGATIVE  Casts Latest Ref Range: NEGATIVE  WBC CAST (A)  Color, Urine Latest Ref Range: YELLOW  YELLOW  Glucose Latest Ref Range: NEGATIVE mg/dL NEGATIVE  Hgb urine dipstick Latest Ref Range: NEGATIVE  LARGE (A)  Ketones, ur Latest Ref Range: NEGATIVE mg/dL NEGATIVE  Leukocytes, UA Latest Ref Range: NEGATIVE  LARGE (A)  Nitrite Latest Ref Range: NEGATIVE  POSITIVE (A)  pH Latest Ref Range: 5.0-8.0  7.0  Protein Latest Ref Range: NEGATIVE mg/dL 30 (A)  RBC / HPF Latest Ref Range: <3 RBC/hpf 7-10  Specific Gravity, Urine Latest Ref Range: 1.005-1.030  1.011  Urobilinogen, UA Latest Ref Range: 0.0-1.0 mg/dL 1.0  WBC, UA Latest Ref Range: <3 WBC/hpf TOO NUMEROUS TO C...   16/ hypoK resolved LOS (Days) 11 A FACE TO FACE EVALUATION WAS PERFORMED  Wilberta Dorvil HENRY 06/11/2015, 6:32 AM

## 2015-06-23 ENCOUNTER — Other Ambulatory Visit: Payer: Self-pay

## 2015-06-23 DIAGNOSIS — G8918 Other acute postprocedural pain: Secondary | ICD-10-CM

## 2015-06-23 MED ORDER — OXYCODONE HCL 5 MG PO TABS: 5.0000 mg | ORAL_TABLET | Freq: Four times a day (QID) | ORAL | Status: DC | PRN

## 2015-06-23 NOTE — Telephone Encounter (Signed)
RX refill for Oxycodone 5 mg printed out. Patient will pick up later today

## 2015-06-27 ENCOUNTER — Other Ambulatory Visit: Payer: Self-pay | Admitting: Thoracic Surgery (Cardiothoracic Vascular Surgery)

## 2015-06-27 DIAGNOSIS — R222 Localized swelling, mass and lump, trunk: Secondary | ICD-10-CM

## 2015-06-28 ENCOUNTER — Ambulatory Visit (INDEPENDENT_AMBULATORY_CARE_PROVIDER_SITE_OTHER): Payer: Self-pay | Admitting: Thoracic Surgery (Cardiothoracic Vascular Surgery)

## 2015-06-28 ENCOUNTER — Encounter: Payer: Self-pay | Admitting: Thoracic Surgery (Cardiothoracic Vascular Surgery)

## 2015-06-28 ENCOUNTER — Ambulatory Visit
Admission: RE | Admit: 2015-06-28 | Discharge: 2015-06-28 | Disposition: A | Discharge status: Home / Self Care | Payer: Medicare HMO | Source: Ambulatory Visit | Attending: Thoracic Surgery (Cardiothoracic Vascular Surgery) | Admitting: Thoracic Surgery (Cardiothoracic Vascular Surgery)

## 2015-06-28 DIAGNOSIS — Z9889 Other specified postprocedural states: Secondary | ICD-10-CM

## 2015-06-28 DIAGNOSIS — R222 Localized swelling, mass and lump, trunk: Secondary | ICD-10-CM

## 2015-06-28 DIAGNOSIS — C493 Malignant neoplasm of connective and soft tissue of thorax: Secondary | ICD-10-CM

## 2015-06-28 MED ORDER — OXYCODONE HCL 10 MG PO TABS: 10.0000 mg | ORAL_TABLET | Freq: Four times a day (QID) | ORAL | Status: DC | PRN
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
## 2015-06-29 ENCOUNTER — Telehealth: Payer: Self-pay | Admitting: *Deleted

## 2015-06-29 DIAGNOSIS — R222 Localized swelling, mass and lump, trunk: Secondary | ICD-10-CM

## 2015-06-29 NOTE — Telephone Encounter (Signed)
Oncology Nurse Navigator Documentation  Oncology Nurse Navigator Flowsheets 06/29/2015  Referral date to RadOnc/MedOnc 06/28/2015  Navigator Encounter Type Telephone/I received referral on Jerry [name].  I called to set up with [facility] per Dr. Leonarda Salon request.  Patient is unable to get here on a Thursday.  He is only available on Monday's.  He is scheduled to see Dr. Julien Nordmann on 07/25/15 arrive at 1:45  Patient Visit Type Initial  Treatment Phase Other  Interventions Coordination of Care  Coordination of Care MD Appointments  Time Spent with Patient 30

## 2015-07-04 ENCOUNTER — Telehealth: Payer: Self-pay | Admitting: *Deleted

## 2015-07-04 NOTE — Telephone Encounter (Signed)
Oncology Nurse Navigator Documentation  Oncology Nurse Navigator Flowsheets 07/04/2015  Navigator Encounter Type Telephone/patient called and left me a vm message that he is unable to make the appt with Dr. Julien Nordmann.  I called back with another appt.  He stated he needs to be seen in the am.  I stated all new patient appt are in the afternoon.  He needs am appt for transportation issues.  I spoke with Dr. Julien Nordmann.  He stated patient can be seen by Dr. Irene Limbo non urgent appt.  I will follow up with HIM dept to schedule.    Patient Visit Type Follow-up  Treatment Phase -  Interventions Coordination of Care  Coordination of Care MD Appointments  Time Spent with Patient 15

## 2015-07-06 ENCOUNTER — Telehealth: Payer: Self-pay | Admitting: Hematology

## 2015-07-06 NOTE — Telephone Encounter (Signed)
Pt aware of appt. 07/18/15@10 :30 Referring Dr. Roxan Hockey DX- Mass of Chest Wall

## 2015-07-08 ENCOUNTER — Other Ambulatory Visit: Payer: Self-pay | Admitting: *Deleted

## 2015-07-08 DIAGNOSIS — G8929 Other chronic pain: Secondary | ICD-10-CM

## 2015-07-08 MED ORDER — OXYCODONE HCL 10 MG PO TABS: 10.0000 mg | ORAL_TABLET | Freq: Four times a day (QID) | ORAL | Status: DC | PRN

## 2015-07-08 NOTE — Progress Notes (Signed)
Mr. Favinger has called for a refill for his Oxycodone 10mg  for his chronic pain.  He has requested this until he sees his pain management doctor on 07/23/15.  This was granted by Dr.  Roxan Hockey and a new signed script was made available for pickup today.  He is aware and most appreciative.

## 2015-07-11 ENCOUNTER — Encounter (HOSPITAL_BASED_OUTPATIENT_CLINIC_OR_DEPARTMENT_OTHER): Payer: Medicare HMO

## 2015-07-15 DIAGNOSIS — C419 Malignant neoplasm of bone and articular cartilage, unspecified: Secondary | ICD-10-CM | POA: Diagnosis not present

## 2015-07-15 DIAGNOSIS — N390 Urinary tract infection, site not specified: Secondary | ICD-10-CM | POA: Diagnosis not present

## 2015-07-15 DIAGNOSIS — J156 Pneumonia due to other aerobic Gram-negative bacteria: Secondary | ICD-10-CM | POA: Diagnosis not present

## 2015-07-15 DIAGNOSIS — L7682 Other postprocedural complications of skin and subcutaneous tissue: Secondary | ICD-10-CM | POA: Diagnosis not present

## 2015-07-18 ENCOUNTER — Telehealth: Payer: Self-pay | Admitting: Hematology

## 2015-07-18 ENCOUNTER — Encounter: Payer: Self-pay | Admitting: Hematology

## 2015-07-18 ENCOUNTER — Ambulatory Visit (HOSPITAL_BASED_OUTPATIENT_CLINIC_OR_DEPARTMENT_OTHER): Payer: Medicare HMO | Admitting: Hematology

## 2015-07-18 VITALS — BP 127/45 | HR 72 | Temp 97.6°F | Resp 18 | Ht 74.0 in | Wt 271.2 lb

## 2015-07-18 DIAGNOSIS — C419 Malignant neoplasm of bone and articular cartilage, unspecified: Secondary | ICD-10-CM

## 2015-07-18 DIAGNOSIS — R5381 Other malaise: Secondary | ICD-10-CM

## 2015-07-18 DIAGNOSIS — R222 Localized swelling, mass and lump, trunk: Secondary | ICD-10-CM

## 2015-07-18 DIAGNOSIS — G8929 Other chronic pain: Secondary | ICD-10-CM | POA: Diagnosis not present

## 2015-07-18 MED ORDER — OXYCODONE HCL 10 MG PO TABS: 20.0000 mg | ORAL_TABLET | Freq: Four times a day (QID) | ORAL | Status: DC | PRN

## 2015-07-18 MED ORDER — LORAZEPAM 1 MG PO TABS: 1.0000 mg | ORAL_TABLET | Freq: Three times a day (TID) | ORAL | Status: DC | PRN

## 2015-07-18 NOTE — Telephone Encounter (Signed)
per pof to [place] pt appt-gave pt copy of avs-adv [facility] will call to sch scan
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
## 2015-07-20 ENCOUNTER — Other Ambulatory Visit (HOSPITAL_BASED_OUTPATIENT_CLINIC_OR_DEPARTMENT_OTHER): Payer: Medicare HMO

## 2015-07-20 DIAGNOSIS — C419 Malignant neoplasm of bone and articular cartilage, unspecified: Secondary | ICD-10-CM

## 2015-07-20 DIAGNOSIS — R222 Localized swelling, mass and lump, trunk: Secondary | ICD-10-CM

## 2015-07-20 LAB — COMPREHENSIVE METABOLIC PANEL
ALT: 13 U/L (ref 0–55)
AST: 18 U/L (ref 5–34)
Albumin: 3.6 g/dL (ref 3.5–5.0)
Alkaline Phosphatase: 86 U/L (ref 40–150)
Anion Gap: 11 mEq/L (ref 3–11)
BILIRUBIN TOTAL: 0.41 mg/dL (ref 0.20–1.20)
BUN: 7.8 mg/dL (ref 7.0–26.0)
CO2: 29 meq/L (ref 22–29)
CREATININE: 1.1 mg/dL (ref 0.7–1.3)
Calcium: 9.6 mg/dL (ref 8.4–10.4)
Chloride: 99 mEq/L (ref 98–109)
EGFR: 71 mL/min/{1.73_m2} — ABNORMAL LOW (ref >90)
GLUCOSE: 111 mg/dL (ref 70–140)
Potassium: 3.9 mEq/L (ref 3.5–5.1)
SODIUM: 139 meq/L (ref 136–145)
TOTAL PROTEIN: 8.1 g/dL (ref 6.4–8.3)

## 2015-07-20 LAB — CBC & DIFF AND RETIC
BASO%: 0.5 % (ref 0.0–2.0)
Basophils Absolute: 0 10*3/uL (ref 0.0–0.1)
EOS ABS: 0.3 10*3/uL (ref 0.0–0.5)
EOS%: 5.4 % (ref 0.0–7.0)
HCT: 39.4 % (ref 38.4–49.9)
HGB: 12 g/dL — ABNORMAL LOW (ref 13.0–17.1)
IMMATURE RETIC FRACT: 7.7 % (ref 3.00–10.60)
LYMPH#: 2.8 10*3/uL (ref 0.9–3.3)
LYMPH%: 45.4 % (ref 14.0–49.0)
MCH: 27.5 pg (ref 27.2–33.4)
MCHC: 30.5 g/dL — ABNORMAL LOW (ref 32.0–36.0)
MCV: 90.4 fL (ref 79.3–98.0)
MONO#: 0.4 10*3/uL (ref 0.1–0.9)
MONO%: 6.3 % (ref 0.0–14.0)
NEUT%: 42.4 % (ref 39.0–75.0)
NEUTROS ABS: 2.6 10*3/uL (ref 1.5–6.5)
Platelets: 183 10*3/uL (ref 140–400)
RBC: 4.36 10*6/uL (ref 4.20–5.82)
RDW: 15 % — ABNORMAL HIGH (ref 11.0–14.6)
RETIC CT ABS: 63.66 10*3/uL (ref 34.80–93.90)
Retic %: 1.46 % (ref 0.80–1.80)
WBC: 6.2 10*3/uL (ref 4.0–10.3)

## 2015-07-20 LAB — LACTATE DEHYDROGENASE: LDH: 191 U/L (ref 125–245)

## 2015-07-25 ENCOUNTER — Other Ambulatory Visit: Payer: Medicare HMO

## 2015-07-25 ENCOUNTER — Ambulatory Visit: Payer: Medicare HMO | Admitting: Internal Medicine

## 2015-07-28 ENCOUNTER — Ambulatory Visit (HOSPITAL_COMMUNITY): Payer: Medicare HMO

## 2015-07-28 ENCOUNTER — Encounter (HOSPITAL_COMMUNITY): Payer: Medicare HMO

## 2015-08-09 ENCOUNTER — Ambulatory Visit: Payer: Medicare HMO | Admitting: Hematology

## 2015-08-15 ENCOUNTER — Ambulatory Visit (HOSPITAL_COMMUNITY): Payer: Medicare HMO

## 2015-08-16 ENCOUNTER — Other Ambulatory Visit: Payer: Self-pay | Admitting: *Deleted

## 2015-08-17 ENCOUNTER — Other Ambulatory Visit: Payer: Self-pay | Admitting: *Deleted

## 2015-08-17 ENCOUNTER — Telehealth: Payer: Self-pay | Admitting: Hematology

## 2015-08-17 NOTE — Telephone Encounter (Signed)
Aware of new md appointment

## 2015-08-23 ENCOUNTER — Telehealth: Payer: Self-pay | Admitting: Hematology

## 2015-08-23 NOTE — Telephone Encounter (Signed)
pt cld to CX appt-stated cant have PET because cant lie down on his back-stated no need to Dr Irene Limbo

## 2015-08-24 ENCOUNTER — Ambulatory Visit (HOSPITAL_COMMUNITY): Payer: Medicare HMO

## 2015-08-25 ENCOUNTER — Ambulatory Visit: Payer: Medicare HMO | Admitting: Hematology

## 2015-08-29 ENCOUNTER — Other Ambulatory Visit: Payer: Self-pay | Admitting: Thoracic Surgery (Cardiothoracic Vascular Surgery)

## 2015-08-29 DIAGNOSIS — I25119 Atherosclerotic heart disease of native coronary artery with unspecified angina pectoris: Secondary | ICD-10-CM

## 2015-08-30 ENCOUNTER — Ambulatory Visit (INDEPENDENT_AMBULATORY_CARE_PROVIDER_SITE_OTHER): Payer: Medicare HMO | Admitting: Thoracic Surgery (Cardiothoracic Vascular Surgery)

## 2015-08-30 ENCOUNTER — Encounter: Payer: Self-pay | Admitting: Thoracic Surgery (Cardiothoracic Vascular Surgery)

## 2015-08-30 VITALS — BP 133/79 | HR 78 | Resp 18 | Ht 74.0 in | Wt 272.0 lb

## 2015-08-30 DIAGNOSIS — Z9889 Other specified postprocedural states: Secondary | ICD-10-CM | POA: Diagnosis not present

## 2015-08-30 DIAGNOSIS — C493 Malignant neoplasm of connective and soft tissue of thorax: Secondary | ICD-10-CM | POA: Diagnosis not present
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
## 2015-11-28 ENCOUNTER — Other Ambulatory Visit: Payer: Self-pay | Admitting: Thoracic Surgery (Cardiothoracic Vascular Surgery)

## 2015-11-28 DIAGNOSIS — R222 Localized swelling, mass and lump, trunk: Secondary | ICD-10-CM

## 2015-11-29 ENCOUNTER — Ambulatory Visit (INDEPENDENT_AMBULATORY_CARE_PROVIDER_SITE_OTHER): Payer: Medicare HMO | Admitting: Thoracic Surgery (Cardiothoracic Vascular Surgery)

## 2015-11-29 ENCOUNTER — Encounter: Payer: Self-pay | Admitting: Thoracic Surgery (Cardiothoracic Vascular Surgery)

## 2015-11-29 ENCOUNTER — Ambulatory Visit
Admission: RE | Admit: 2015-11-29 | Discharge: 2015-11-29 | Disposition: A | Discharge status: Home / Self Care | Payer: Medicare HMO | Source: Ambulatory Visit | Attending: Thoracic Surgery (Cardiothoracic Vascular Surgery) | Admitting: Thoracic Surgery (Cardiothoracic Vascular Surgery)

## 2015-11-29 VITALS — BP 145/75 | HR 90 | Resp 20 | Ht 74.0 in | Wt 272.0 lb

## 2015-11-29 DIAGNOSIS — R222 Localized swelling, mass and lump, trunk: Secondary | ICD-10-CM | POA: Diagnosis not present

## 2015-11-29 DIAGNOSIS — C413 Malignant neoplasm of ribs, sternum and clavicle: Secondary | ICD-10-CM | POA: Diagnosis not present

## 2015-11-29 DIAGNOSIS — Z9889 Other specified postprocedural states: Secondary | ICD-10-CM | POA: Diagnosis not present

## 2015-11-29 DIAGNOSIS — C493 Malignant neoplasm of connective and soft tissue of thorax: Secondary | ICD-10-CM
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
## 2016-05-18 ENCOUNTER — Other Ambulatory Visit: Payer: Self-pay | Admitting: Thoracic Surgery (Cardiothoracic Vascular Surgery)

## 2016-05-18 DIAGNOSIS — R222 Localized swelling, mass and lump, trunk: Secondary | ICD-10-CM

## 2016-05-21 ENCOUNTER — Encounter (HOSPITAL_COMMUNITY): Payer: Self-pay | Admitting: *Deleted

## 2016-05-21 NOTE — Progress Notes (Signed)
Pt states he had 2 stents placed back in the 1990's and had a "light" heart attack at that time. He states he used to see Dr. Mare Ferrari, but hasn't seen a cardiologist in 2-3 years. States his PCP checks his heart. Pt denies any recent chest pain or sob.

## 2016-05-22 ENCOUNTER — Ambulatory Visit (HOSPITAL_COMMUNITY)
Admission: RE | Admit: 2016-05-22 | Discharge: 2016-05-22 | Disposition: A | Discharge status: Home / Self Care | Payer: Medicare HMO | Source: Ambulatory Visit | Attending: Thoracic Surgery (Cardiothoracic Vascular Surgery) | Admitting: Thoracic Surgery (Cardiothoracic Vascular Surgery)

## 2016-05-22 ENCOUNTER — Ambulatory Visit (HOSPITAL_COMMUNITY): Payer: Medicare HMO | Admitting: Certified Registered Nurse Anesthetist

## 2016-05-22 ENCOUNTER — Encounter (HOSPITAL_COMMUNITY): Payer: Self-pay | Admitting: Certified Registered"

## 2016-05-22 ENCOUNTER — Ambulatory Visit (HOSPITAL_COMMUNITY): Payer: Medicare HMO

## 2016-05-22 ENCOUNTER — Encounter (HOSPITAL_COMMUNITY)
Admission: RE | Disposition: A | Discharge status: Home / Self Care | Payer: Self-pay | Source: Ambulatory Visit | Attending: Thoracic Surgery (Cardiothoracic Vascular Surgery)

## 2016-05-22 DIAGNOSIS — R222 Localized swelling, mass and lump, trunk: Secondary | ICD-10-CM

## 2016-05-22 DIAGNOSIS — Z87891 Personal history of nicotine dependence: Secondary | ICD-10-CM | POA: Insufficient documentation

## 2016-05-22 DIAGNOSIS — I10 Essential (primary) hypertension: Secondary | ICD-10-CM | POA: Insufficient documentation

## 2016-05-22 DIAGNOSIS — K219 Gastro-esophageal reflux disease without esophagitis: Secondary | ICD-10-CM | POA: Diagnosis not present

## 2016-05-22 DIAGNOSIS — F418 Other specified anxiety disorders: Secondary | ICD-10-CM | POA: Diagnosis not present

## 2016-05-22 DIAGNOSIS — J90 Pleural effusion, not elsewhere classified: Secondary | ICD-10-CM | POA: Insufficient documentation

## 2016-05-22 HISTORY — PX: RADIOLOGY WITH ANESTHESIA: SHX6223

## 2016-05-22 HISTORY — DX: Constipation, unspecified: K59.00

## 2016-05-22 HISTORY — DX: Iron deficiency: E61.1

## 2016-05-22 HISTORY — DX: Pneumonia, unspecified organism: J18.9

## 2016-05-22 HISTORY — DX: Gastro-esophageal reflux disease without esophagitis: K21.9

## 2016-05-22 LAB — BASIC METABOLIC PANEL
Anion gap: 9 (ref 5–15)
BUN: 7 mg/dL (ref 6–20)
CHLORIDE: 98 mmol/L — AB (ref 101–111)
CO2: 34 mmol/L — ABNORMAL HIGH (ref 22–32)
CREATININE: 0.98 mg/dL (ref 0.61–1.24)
Calcium: 9.2 mg/dL (ref 8.9–10.3)
Glucose, Bld: 141 mg/dL — ABNORMAL HIGH (ref 65–99)
Potassium: 4.6 mmol/L (ref 3.5–5.1)
SODIUM: 141 mmol/L (ref 135–145)

## 2016-05-22 LAB — CBC
HCT: 40.7 % (ref 39.0–52.0)
Hemoglobin: 12.4 g/dL — ABNORMAL LOW (ref 13.0–17.0)
MCH: 28.6 pg (ref 26.0–34.0)
MCHC: 30.5 g/dL (ref 30.0–36.0)
MCV: 93.8 fL (ref 78.0–100.0)
Platelets: 137 10*3/uL — ABNORMAL LOW (ref 150–400)
RBC: 4.34 MIL/uL (ref 4.22–5.81)
RDW: 14.4 % (ref 11.5–15.5)
WBC: 5.6 10*3/uL (ref 4.0–10.5)

## 2016-05-22 SURGERY — RADIOLOGY WITH ANESTHESIA
Anesthesia: General

## 2016-05-22 MED ORDER — LIDOCAINE 2% (20 MG/ML) 5 ML SYRINGE
INTRAMUSCULAR | Status: DC | PRN
  Administered 2016-05-22: 100 mg via INTRAVENOUS

## 2016-05-22 MED ORDER — FENTANYL CITRATE (PF) 100 MCG/2ML IJ SOLN
INTRAMUSCULAR | Status: AC
  Filled 2016-05-22: qty 2

## 2016-05-22 MED ORDER — PROPOFOL 10 MG/ML IV BOLUS
INTRAVENOUS | Status: DC | PRN
  Administered 2016-05-22: 150 mg via INTRAVENOUS

## 2016-05-22 MED ORDER — MIDAZOLAM HCL 2 MG/2ML IJ SOLN
INTRAMUSCULAR | Status: AC
  Filled 2016-05-22: qty 2

## 2016-05-22 MED ORDER — LACTATED RINGERS IV SOLN
INTRAVENOUS | Status: DC
  Administered 2016-05-22: 50 mL/h via INTRAVENOUS

## 2016-05-22 NOTE — Anesthesia Postprocedure Evaluation (Signed)
Anesthesia Post Note  Patient: Jerry [name]  Procedure(s) Performed: Procedure(s) (LRB): CT CHEST WITHOUT CONTRAST (N/A)  Patient location during evaluation: PACU Anesthesia Type: General Level of consciousness: awake and alert Pain management: pain level controlled Vital Signs Assessment: post-procedure vital signs reviewed and stable Respiratory status: spontaneous breathing, nonlabored ventilation, respiratory function stable and patient connected to nasal cannula oxygen Cardiovascular status: blood pressure returned to baseline and stable Postop Assessment: no signs of nausea or vomiting Anesthetic complications: no    Last Vitals:  Vitals:   05/22/16 1130 05/22/16 1144  BP:  (!) 114/54  Pulse: 63 74  Resp: 14 16  Temp:      Last Pain:  Vitals:   05/22/16 1144  TempSrc:   PainSc: 0-No pain                 Meiah Zamudio DAVID

## 2016-05-22 NOTE — Transfer of Care (Signed)
Immediate Anesthesia Transfer of Care Note  Patient: Jerry [name]  Procedure(s) Performed: Procedure(s): CT CHEST WITHOUT CONTRAST (N/A)  Patient Location: PACU  Anesthesia Type:General  Level of Consciousness: awake, alert  and oriented  Airway & Oxygen Therapy: Patient Spontanous Breathing and Patient connected to nasal cannula oxygen  Post-op Assessment: Report given to RN, Post -op Vital signs reviewed and stable and Patient moving all extremities X 4  Post vital signs: Reviewed and stable  Last Vitals:  Vitals:   05/22/16 0815  BP: 128/69  Pulse: 76  Resp: 20  Temp: 36.9 C    Last Pain:  Vitals:   05/22/16 0926  TempSrc:   PainSc: 2          Complications: No apparent anesthesia complications

## 2016-05-22 NOTE — Anesthesia Procedure Notes (Addendum)
Procedure Name: LMA Insertion Date/Time: 05/22/2016 11:00 AM Performed by: Lelon Perla A Pre-anesthesia Checklist: Patient identified, Emergency Drugs available, Suction available, Patient being monitored and Timeout performed Patient Re-evaluated:Patient Re-evaluated prior to inductionOxygen Delivery Method: Circle system utilized Preoxygenation: Pre-oxygenation with 100% oxygen Intubation Type: IV induction LMA: LMA inserted LMA Size: 5.0 Number of attempts: 1

## 2016-05-22 NOTE — Anesthesia Preprocedure Evaluation (Signed)
Anesthesia Evaluation  Patient identified by MRN, date of birth, ID band Patient awake    Reviewed: Allergy & Precautions, NPO status , Patient's Chart, lab work & pertinent test results  Airway Mallampati: II  TM Distance: >3 FB Neck ROM: Full    Dental   Pulmonary former smoker,    Pulmonary exam normal        Cardiovascular hypertension, Normal cardiovascular exam     Neuro/Psych Anxiety Depression    GI/Hepatic GERD  Medicated and Controlled,  Endo/Other    Renal/GU      Musculoskeletal   Abdominal   Peds  Hematology   Anesthesia Other Findings   Reproductive/Obstetrics                             Anesthesia Physical Anesthesia Plan  ASA: III  Anesthesia Plan: General   Post-op Pain Management:    Induction: Intravenous  Airway Management Planned: LMA  Additional Equipment:   Intra-op Plan:   Post-operative Plan: Extubation in OR  Informed Consent: I have reviewed the patients History and Physical, chart, labs and discussed the procedure including the risks, benefits and alternatives for the proposed anesthesia with the patient or authorized representative who has indicated his/her understanding and acceptance.     Plan Discussed with: CRNA and Surgeon  Anesthesia Plan Comments:         Anesthesia Quick Evaluation

## 2016-05-22 NOTE — Anesthesia Procedure Notes (Deleted)
Performed by: Wilburn Cornelia

## 2016-05-23 ENCOUNTER — Encounter (HOSPITAL_COMMUNITY): Payer: Self-pay | Admitting: Radiology

## 2016-05-23 NOTE — Addendum Note (Signed)
Addendum  created 05/23/16 1631 by Josephine Igo, CRNA   Anesthesia Event edited

## 2016-05-29 ENCOUNTER — Ambulatory Visit (INDEPENDENT_AMBULATORY_CARE_PROVIDER_SITE_OTHER): Payer: Medicare HMO | Admitting: Thoracic Surgery (Cardiothoracic Vascular Surgery)

## 2016-05-29 ENCOUNTER — Encounter: Payer: Self-pay | Admitting: Thoracic Surgery (Cardiothoracic Vascular Surgery)

## 2016-05-29 VITALS — BP 136/70 | HR 80 | Resp 18 | Ht 73.0 in | Wt 296.0 lb

## 2016-05-29 DIAGNOSIS — Z9889 Other specified postprocedural states: Secondary | ICD-10-CM

## 2016-05-29 DIAGNOSIS — C413 Malignant neoplasm of ribs, sternum and clavicle: Secondary | ICD-10-CM | POA: Diagnosis not present

## 2016-05-29 DIAGNOSIS — R222 Localized swelling, mass and lump, trunk: Secondary | ICD-10-CM

## 2016-05-29 DIAGNOSIS — C493 Malignant neoplasm of connective and soft tissue of thorax: Secondary | ICD-10-CM | POA: Diagnosis not present
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
## 2016-08-14 ENCOUNTER — Encounter (HOSPITAL_COMMUNITY): Payer: Self-pay | Admitting: General Practice

## 2016-08-14 ENCOUNTER — Emergency Department (HOSPITAL_COMMUNITY): Payer: Medicare HMO

## 2016-08-14 ENCOUNTER — Inpatient Hospital Stay (HOSPITAL_COMMUNITY)
Admission: EM | Admit: 2016-08-14 | Discharge: 2016-08-20 | DRG: 291 | Disposition: A | Discharge status: Home Health | Payer: Medicare HMO | Attending: Internal Medicine | Admitting: Internal Medicine

## 2016-08-14 DIAGNOSIS — R0602 Shortness of breath: Secondary | ICD-10-CM | POA: Diagnosis present

## 2016-08-14 DIAGNOSIS — Z9049 Acquired absence of other specified parts of digestive tract: Secondary | ICD-10-CM | POA: Diagnosis not present

## 2016-08-14 DIAGNOSIS — I5033 Acute on chronic diastolic (congestive) heart failure: Secondary | ICD-10-CM | POA: Diagnosis not present

## 2016-08-14 DIAGNOSIS — Z881 Allergy status to other antibiotic agents status: Secondary | ICD-10-CM

## 2016-08-14 DIAGNOSIS — W010XXA Fall on same level from slipping, tripping and stumbling without subsequent striking against object, initial encounter: Secondary | ICD-10-CM | POA: Diagnosis present

## 2016-08-14 DIAGNOSIS — D696 Thrombocytopenia, unspecified: Secondary | ICD-10-CM | POA: Diagnosis present

## 2016-08-14 DIAGNOSIS — Z825 Family history of asthma and other chronic lower respiratory diseases: Secondary | ICD-10-CM

## 2016-08-14 DIAGNOSIS — J189 Pneumonia, unspecified organism: Secondary | ICD-10-CM | POA: Diagnosis present

## 2016-08-14 DIAGNOSIS — G894 Chronic pain syndrome: Secondary | ICD-10-CM | POA: Diagnosis present

## 2016-08-14 DIAGNOSIS — E662 Morbid (severe) obesity with alveolar hypoventilation: Secondary | ICD-10-CM | POA: Diagnosis present

## 2016-08-14 DIAGNOSIS — Z87891 Personal history of nicotine dependence: Secondary | ICD-10-CM

## 2016-08-14 DIAGNOSIS — E875 Hyperkalemia: Secondary | ICD-10-CM | POA: Diagnosis present

## 2016-08-14 DIAGNOSIS — Z885 Allergy status to narcotic agent status: Secondary | ICD-10-CM

## 2016-08-14 DIAGNOSIS — J9601 Acute respiratory failure with hypoxia: Secondary | ICD-10-CM | POA: Diagnosis not present

## 2016-08-14 DIAGNOSIS — Z7982 Long term (current) use of aspirin: Secondary | ICD-10-CM

## 2016-08-14 DIAGNOSIS — I2510 Atherosclerotic heart disease of native coronary artery without angina pectoris: Secondary | ICD-10-CM | POA: Diagnosis present

## 2016-08-14 DIAGNOSIS — W19XXXA Unspecified fall, initial encounter: Secondary | ICD-10-CM | POA: Diagnosis not present

## 2016-08-14 DIAGNOSIS — I509 Heart failure, unspecified: Secondary | ICD-10-CM | POA: Diagnosis not present

## 2016-08-14 DIAGNOSIS — J9612 Chronic respiratory failure with hypercapnia: Secondary | ICD-10-CM | POA: Diagnosis present

## 2016-08-14 DIAGNOSIS — K219 Gastro-esophageal reflux disease without esophagitis: Secondary | ICD-10-CM | POA: Diagnosis not present

## 2016-08-14 DIAGNOSIS — Z85830 Personal history of malignant neoplasm of bone: Secondary | ICD-10-CM | POA: Diagnosis not present

## 2016-08-14 DIAGNOSIS — Z880 Allergy status to penicillin: Secondary | ICD-10-CM

## 2016-08-14 DIAGNOSIS — I10 Essential (primary) hypertension: Secondary | ICD-10-CM

## 2016-08-14 DIAGNOSIS — W19XXXD Unspecified fall, subsequent encounter: Secondary | ICD-10-CM | POA: Diagnosis not present

## 2016-08-14 DIAGNOSIS — I252 Old myocardial infarction: Secondary | ICD-10-CM

## 2016-08-14 DIAGNOSIS — S81812A Laceration without foreign body, left lower leg, initial encounter: Secondary | ICD-10-CM | POA: Diagnosis present

## 2016-08-14 DIAGNOSIS — F419 Anxiety disorder, unspecified: Secondary | ICD-10-CM | POA: Diagnosis present

## 2016-08-14 DIAGNOSIS — R0902 Hypoxemia: Secondary | ICD-10-CM

## 2016-08-14 DIAGNOSIS — E785 Hyperlipidemia, unspecified: Secondary | ICD-10-CM | POA: Diagnosis present

## 2016-08-14 DIAGNOSIS — Z87892 Personal history of anaphylaxis: Secondary | ICD-10-CM

## 2016-08-14 DIAGNOSIS — Y92009 Unspecified place in unspecified non-institutional (private) residence as the place of occurrence of the external cause: Secondary | ICD-10-CM

## 2016-08-14 DIAGNOSIS — Z6841 Body Mass Index (BMI) 40.0 and over, adult: Secondary | ICD-10-CM

## 2016-08-14 DIAGNOSIS — Z96651 Presence of right artificial knee joint: Secondary | ICD-10-CM | POA: Diagnosis present

## 2016-08-14 DIAGNOSIS — I110 Hypertensive heart disease with heart failure: Secondary | ICD-10-CM | POA: Diagnosis present

## 2016-08-14 DIAGNOSIS — Z79891 Long term (current) use of opiate analgesic: Secondary | ICD-10-CM

## 2016-08-14 DIAGNOSIS — M549 Dorsalgia, unspecified: Secondary | ICD-10-CM | POA: Diagnosis present

## 2016-08-14 DIAGNOSIS — J439 Emphysema, unspecified: Secondary | ICD-10-CM | POA: Diagnosis present

## 2016-08-14 HISTORY — DX: Low back pain, unspecified: M54.50

## 2016-08-14 HISTORY — DX: Unspecified place in unspecified non-institutional (private) residence as the place of occurrence of the external cause: Y92.009

## 2016-08-14 HISTORY — DX: Low back pain: M54.5

## 2016-08-14 HISTORY — DX: Other chronic pain: G89.29

## 2016-08-14 HISTORY — DX: Unspecified fall, initial encounter: W19.XXXA

## 2016-08-14 HISTORY — DX: Unspecified fall, initial encounter: Y92.009

## 2016-08-14 LAB — CBC WITH DIFFERENTIAL/PLATELET
Basophils Absolute: 0 10*3/uL (ref 0.0–0.1)
Basophils Relative: 0 %
EOS PCT: 3 %
Eosinophils Absolute: 0.2 10*3/uL (ref 0.0–0.7)
HCT: 42.6 % (ref 39.0–52.0)
Hemoglobin: 12.4 g/dL — ABNORMAL LOW (ref 13.0–17.0)
LYMPHS ABS: 1.7 10*3/uL (ref 0.7–4.0)
Lymphocytes Relative: 26 %
MCH: 27.7 pg (ref 26.0–34.0)
MCHC: 29.1 g/dL — AB (ref 30.0–36.0)
MCV: 95.3 fL (ref 78.0–100.0)
MONO ABS: 0.5 10*3/uL (ref 0.1–1.0)
MONOS PCT: 8 %
Neutro Abs: 4.1 10*3/uL (ref 1.7–7.7)
Neutrophils Relative %: 63 %
PLATELETS: 143 10*3/uL — AB (ref 150–400)
RBC: 4.47 MIL/uL (ref 4.22–5.81)
RDW: 17.4 % — ABNORMAL HIGH (ref 11.5–15.5)
WBC: 6.5 10*3/uL (ref 4.0–10.5)

## 2016-08-14 LAB — COMPREHENSIVE METABOLIC PANEL
ALT: 20 U/L (ref 17–63)
AST: 57 U/L — ABNORMAL HIGH (ref 15–41)
Albumin: 3 g/dL — ABNORMAL LOW (ref 3.5–5.0)
Alkaline Phosphatase: 54 U/L (ref 38–126)
Anion gap: 8 (ref 5–15)
BUN: 8 mg/dL (ref 6–20)
CO2: 36 mmol/L — ABNORMAL HIGH (ref 22–32)
Calcium: 8.2 mg/dL — ABNORMAL LOW (ref 8.9–10.3)
Chloride: 94 mmol/L — ABNORMAL LOW (ref 101–111)
Creatinine, Ser: 0.95 mg/dL (ref 0.61–1.24)
GFR calc Af Amer: >60 mL/min (ref >60)
GFR calc non Af Amer: >60 mL/min (ref >60)
Glucose, Bld: 106 mg/dL — ABNORMAL HIGH (ref 65–99)
Potassium: 5.2 mmol/L — ABNORMAL HIGH (ref 3.5–5.1)
Sodium: 138 mmol/L (ref 135–145)
Total Bilirubin: 0.9 mg/dL (ref 0.3–1.2)
Total Protein: 6.9 g/dL (ref 6.5–8.1)

## 2016-08-14 LAB — D-DIMER, QUANTITATIVE: D-Dimer, Quant: 2.08 ug/mL-FEU — ABNORMAL HIGH (ref 0.00–0.50)

## 2016-08-14 LAB — TROPONIN I: Troponin I: <0.03 ng/mL (ref <0.03)

## 2016-08-14 LAB — BRAIN NATRIURETIC PEPTIDE: B Natriuretic Peptide: 28.4 pg/mL (ref 0.0–100.0)

## 2016-08-14 MED ORDER — FUROSEMIDE 10 MG/ML IJ SOLN
60.0000 mg | Freq: Two times a day (BID) | INTRAMUSCULAR | Status: DC
  Administered 2016-08-15: 60 mg via INTRAVENOUS
  Filled 2016-08-14: qty 6

## 2016-08-14 MED ORDER — ALPRAZOLAM 0.5 MG PO TABS: 0.2500 mg | ORAL_TABLET | Freq: Two times a day (BID) | ORAL | Status: DC | PRN

## 2016-08-14 MED ORDER — PROMETHAZINE HCL 25 MG PO TABS
25.0000 mg | ORAL_TABLET | Freq: Every day | ORAL | Status: DC | PRN
  Filled 2016-08-14: qty 1

## 2016-08-14 MED ORDER — ZOLPIDEM TARTRATE 5 MG PO TABS: 5.0000 mg | ORAL_TABLET | Freq: Every evening | ORAL | Status: DC | PRN

## 2016-08-14 MED ORDER — DM-GUAIFENESIN ER 30-600 MG PO TB12
1.0000 | ORAL_TABLET | Freq: Two times a day (BID) | ORAL | Status: DC
  Administered 2016-08-15 – 2016-08-19 (×10): 1 via ORAL
  Filled 2016-08-14 (×10): qty 1

## 2016-08-14 MED ORDER — VITAMIN C 500 MG PO TABS
500.0000 mg | ORAL_TABLET | Freq: Every day | ORAL | Status: DC
  Administered 2016-08-15 – 2016-08-20 (×6): 500 mg via ORAL
  Filled 2016-08-14 (×6): qty 1

## 2016-08-14 MED ORDER — NITROGLYCERIN 0.4 MG SL SUBL: 0.4000 mg | SUBLINGUAL_TABLET | SUBLINGUAL | Status: DC | PRN

## 2016-08-14 MED ORDER — SENNOSIDES-DOCUSATE SODIUM 8.6-50 MG PO TABS
2.0000 | ORAL_TABLET | Freq: Two times a day (BID) | ORAL | Status: DC
  Administered 2016-08-15 – 2016-08-20 (×12): 2 via ORAL
  Filled 2016-08-14 (×12): qty 2

## 2016-08-14 MED ORDER — SODIUM CHLORIDE 0.9% FLUSH
3.0000 mL | Freq: Two times a day (BID) | INTRAVENOUS | Status: DC
  Administered 2016-08-15: 3 mL via INTRAVENOUS

## 2016-08-14 MED ORDER — CALCIUM CARBONATE ANTACID 500 MG PO CHEW: 1.0000 | CHEWABLE_TABLET | Freq: Three times a day (TID) | ORAL | Status: DC | PRN

## 2016-08-14 MED ORDER — BISACODYL 5 MG PO TBEC
10.0000 mg | DELAYED_RELEASE_TABLET | Freq: Two times a day (BID) | ORAL | Status: DC
  Administered 2016-08-15 – 2016-08-19 (×11): 10 mg via ORAL
  Filled 2016-08-14 (×11): qty 2

## 2016-08-14 MED ORDER — HEPARIN BOLUS VIA INFUSION
4000.0000 [IU] | Freq: Once | INTRAVENOUS | Status: AC
  Administered 2016-08-14: 4000 [IU] via INTRAVENOUS
  Filled 2016-08-14: qty 4000

## 2016-08-14 MED ORDER — PRAVASTATIN SODIUM 20 MG PO TABS
20.0000 mg | ORAL_TABLET | Freq: Every day | ORAL | Status: DC
  Administered 2016-08-15 – 2016-08-19 (×6): 20 mg via ORAL
  Filled 2016-08-14 (×6): qty 1

## 2016-08-14 MED ORDER — IPRATROPIUM-ALBUTEROL 0.5-2.5 (3) MG/3ML IN SOLN
3.0000 mL | Freq: Once | RESPIRATORY_TRACT | Status: AC
  Administered 2016-08-14: 3 mL via RESPIRATORY_TRACT
  Filled 2016-08-14: qty 3

## 2016-08-14 MED ORDER — LIDOCAINE HCL 2 % IJ SOLN
20.0000 mL | Freq: Once | INTRAMUSCULAR | Status: AC
  Administered 2016-08-14: 400 mg
  Filled 2016-08-14: qty 20

## 2016-08-14 MED ORDER — ZINC SULFATE 220 (50 ZN) MG PO CAPS
220.0000 mg | ORAL_CAPSULE | Freq: Every day | ORAL | Status: DC
  Administered 2016-08-15 – 2016-08-20 (×6): 220 mg via ORAL
  Filled 2016-08-14 (×6): qty 1

## 2016-08-14 MED ORDER — FERROUS SULFATE 325 (65 FE) MG PO TABS: 325.0000 mg | ORAL_TABLET | Freq: Every day | ORAL | Status: DC

## 2016-08-14 MED ORDER — FUROSEMIDE 10 MG/ML IJ SOLN
60.0000 mg | Freq: Once | INTRAMUSCULAR | Status: AC
  Administered 2016-08-14: 60 mg via INTRAVENOUS
  Filled 2016-08-14: qty 6

## 2016-08-14 MED ORDER — HEPARIN (PORCINE) IN NACL 100-0.45 UNIT/ML-% IJ SOLN
2000.0000 [IU]/h | INTRAMUSCULAR | Status: DC
  Administered 2016-08-14 (×2): 1600 [IU]/h via INTRAVENOUS
  Administered 2016-08-15: 2000 [IU]/h via INTRAVENOUS
  Filled 2016-08-14 (×2): qty 250

## 2016-08-14 MED ORDER — ALBUTEROL SULFATE (2.5 MG/3ML) 0.083% IN NEBU
2.5000 mg | INHALATION_SOLUTION | RESPIRATORY_TRACT | Status: DC | PRN
  Administered 2016-08-15 (×2): 2.5 mg via RESPIRATORY_TRACT
  Filled 2016-08-14 (×2): qty 3

## 2016-08-14 MED ORDER — ADULT MULTIVITAMIN W/MINERALS CH
ORAL_TABLET | Freq: Every day | ORAL | Status: DC
  Administered 2016-08-15 – 2016-08-20 (×6): 1 via ORAL
  Filled 2016-08-14 (×6): qty 1

## 2016-08-14 MED ORDER — FUROSEMIDE 10 MG/ML IJ SOLN: 60.0000 mg | Freq: Two times a day (BID) | INTRAMUSCULAR | Status: DC

## 2016-08-14 MED ORDER — SODIUM CHLORIDE 0.9 % IV SOLN: 250.0000 mL | INTRAVENOUS | Status: DC | PRN

## 2016-08-14 MED ORDER — OXYCODONE HCL 5 MG PO TABS
20.0000 mg | ORAL_TABLET | Freq: Once | ORAL | Status: AC
  Administered 2016-08-14: 20 mg via ORAL
  Filled 2016-08-14: qty 4

## 2016-08-14 MED ORDER — GABAPENTIN 300 MG PO CAPS
300.0000 mg | ORAL_CAPSULE | Freq: Three times a day (TID) | ORAL | Status: DC
  Administered 2016-08-15 – 2016-08-20 (×17): 300 mg via ORAL
  Filled 2016-08-14 (×17): qty 1

## 2016-08-14 MED ORDER — OXYCODONE HCL 5 MG PO TABS
30.0000 mg | ORAL_TABLET | Freq: Four times a day (QID) | ORAL | Status: DC | PRN
  Administered 2016-08-15 – 2016-08-20 (×18): 30 mg via ORAL
  Filled 2016-08-14 (×18): qty 6

## 2016-08-14 MED ORDER — MOMETASONE FURO-FORMOTEROL FUM 200-5 MCG/ACT IN AERO
2.0000 | INHALATION_SPRAY | Freq: Two times a day (BID) | RESPIRATORY_TRACT | Status: DC
  Administered 2016-08-15 – 2016-08-20 (×9): 2 via RESPIRATORY_TRACT
  Filled 2016-08-14: qty 8.8

## 2016-08-14 MED ORDER — ASPIRIN EC 81 MG PO TBEC
81.0000 mg | DELAYED_RELEASE_TABLET | Freq: Every day | ORAL | Status: DC
  Administered 2016-08-15 – 2016-08-20 (×6): 81 mg via ORAL
  Filled 2016-08-14 (×6): qty 1

## 2016-08-14 MED ORDER — ACETAMINOPHEN 325 MG PO TABS
650.0000 mg | ORAL_TABLET | ORAL | Status: DC | PRN
Start: 2016-08-14 — End: 2016-08-20

## 2016-08-14 MED ORDER — PANTOPRAZOLE SODIUM 40 MG PO TBEC
40.0000 mg | DELAYED_RELEASE_TABLET | Freq: Two times a day (BID) | ORAL | Status: DC
  Administered 2016-08-15 – 2016-08-20 (×12): 40 mg via ORAL
  Filled 2016-08-14 (×12): qty 1

## 2016-08-14 MED ORDER — NICOTINE 21 MG/24HR TD PT24
21.0000 mg | MEDICATED_PATCH | Freq: Every day | TRANSDERMAL | Status: DC
  Filled 2016-08-14: qty 1

## 2016-08-14 MED ORDER — SODIUM POLYSTYRENE SULFONATE 15 GM/60ML PO SUSP
30.0000 g | Freq: Once | ORAL | Status: DC
  Filled 2016-08-14: qty 120

## 2016-08-14 MED ORDER — TRAZODONE HCL 150 MG PO TABS
225.0000 mg | ORAL_TABLET | Freq: Every day | ORAL | Status: DC
  Administered 2016-08-15 – 2016-08-17 (×4): 225 mg via ORAL
  Administered 2016-08-18: 22:00:00 via ORAL
  Administered 2016-08-19: 225 mg via ORAL
  Filled 2016-08-14 (×6): qty 2

## 2016-08-14 MED ORDER — DOXAZOSIN MESYLATE 1 MG PO TABS
1.0000 mg | ORAL_TABLET | Freq: Every evening | ORAL | Status: DC
  Administered 2016-08-15 – 2016-08-19 (×6): 1 mg via ORAL
  Filled 2016-08-14 (×7): qty 1

## 2016-08-14 MED ORDER — SODIUM CHLORIDE 0.9% FLUSH: 3.0000 mL | INTRAVENOUS | Status: DC | PRN

## 2016-08-14 MED ORDER — DIAZEPAM 5 MG PO TABS: 10.0000 mg | ORAL_TABLET | Freq: Two times a day (BID) | ORAL | Status: DC | PRN
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
## 2016-08-14 NOTE — ED Triage Notes (Signed)
[facility]- pt here after an unwitnessed fall. No LOC. Pt reports he thinks he tripped on the carpet. Per EMS pt had a large L shaped laceration on his left leg, bleeding controlled with dressing. Pt was also noted to have room air saturation of 79% which increased to 96% on 4L.

## 2016-08-14 NOTE — ED Notes (Signed)
Per Merry Proud - PA, applied saline soaked gauze to pt's wound. Replaced the chux under pt's leg.
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
## 2016-08-14 NOTE — ED Notes (Signed)
Alerted Dr Blaine Hamper about pt Bp, Dr states to hold lasix and do not bolus fluid. Will continue to monitor

## 2016-08-14 NOTE — ED Notes (Signed)
CBC clotted, will redraw

## 2016-08-14 NOTE — ED Provider Notes (Signed)
  Face-to-face evaluation   History: He presents for evaluation of injury to left leg, which occurred when he fell. He feels like he tripped. He uses Symbicort but does not have chronic respiratory difficulty. He denies recent shortness of breath, cough, focal weakness or dizziness.  Physical exam: Alert, elderly man who is obese. Lungs have decreased bilaterally with scattered rhonchi. There is no increased work of breathing. Heart regular rate and rhythm, no murmur. Legs with moderate lower leg edema.  Medical screening examination/treatment/procedure(s) were conducted as a shared visit with non-physician practitioner(s) and myself.  I personally evaluated the patient during the encounter   Daleen Bo, MD 08/15/16 1017

## 2016-08-14 NOTE — ED Notes (Signed)
Pt on veni mask at 15 L, pt at 93%

## 2016-08-14 NOTE — ED Notes (Signed)
Patient transported to X-ray 

## 2016-08-14 NOTE — ED Notes (Signed)
Pt o2 saturations dropped to 86% on 3 L. Pt placed on 5L and called respiratory for venti mask

## 2016-08-15 ENCOUNTER — Encounter (HOSPITAL_COMMUNITY): Payer: Self-pay | Admitting: General Practice

## 2016-08-15 ENCOUNTER — Inpatient Hospital Stay (HOSPITAL_COMMUNITY): Payer: Medicare HMO

## 2016-08-15 ENCOUNTER — Other Ambulatory Visit (HOSPITAL_COMMUNITY): Payer: Medicare HMO

## 2016-08-15 DIAGNOSIS — I509 Heart failure, unspecified: Secondary | ICD-10-CM

## 2016-08-15 LAB — BASIC METABOLIC PANEL
Anion gap: 6 (ref 5–15)
BUN: 7 mg/dL (ref 6–20)
CHLORIDE: 93 mmol/L — AB (ref 101–111)
CO2: 41 mmol/L — AB (ref 22–32)
CREATININE: 0.89 mg/dL (ref 0.61–1.24)
Calcium: 8.3 mg/dL — ABNORMAL LOW (ref 8.9–10.3)
GFR calc non Af Amer: >60 mL/min (ref >60)
GLUCOSE: 137 mg/dL — AB (ref 65–99)
Potassium: 3.7 mmol/L (ref 3.5–5.1)
Sodium: 140 mmol/L (ref 135–145)

## 2016-08-15 LAB — TROPONIN I: Troponin I: <0.03 ng/mL (ref <0.03)

## 2016-08-15 LAB — BLOOD GAS, ARTERIAL
Acid-Base Excess: 18 mmol/L — ABNORMAL HIGH (ref 0.0–2.0)
Bicarbonate: 44.3 mmol/L — ABNORMAL HIGH (ref 20.0–28.0)
DRAWN BY: 331471
FIO2: 21
O2 Saturation: 83.6 %
PCO2 ART: 77.9 mmHg — AB (ref 32.0–48.0)
PH ART: 7.373 (ref 7.350–7.450)
Patient temperature: 98.6
pO2, Arterial: 50.1 mmHg — ABNORMAL LOW (ref 83.0–108.0)

## 2016-08-15 LAB — HEPARIN LEVEL (UNFRACTIONATED)
Heparin Unfractionated: 0.13 IU/mL — ABNORMAL LOW (ref 0.30–0.70)
Heparin Unfractionated: 0.23 IU/mL — ABNORMAL LOW (ref 0.30–0.70)

## 2016-08-15 LAB — CBC
HEMATOCRIT: 44.7 % (ref 39.0–52.0)
Hemoglobin: 12.8 g/dL — ABNORMAL LOW (ref 13.0–17.0)
MCH: 27.9 pg (ref 26.0–34.0)
MCHC: 28.6 g/dL — ABNORMAL LOW (ref 30.0–36.0)
MCV: 97.4 fL (ref 78.0–100.0)
PLATELETS: 127 10*3/uL — AB (ref 150–400)
RBC: 4.59 MIL/uL (ref 4.22–5.81)
RDW: 17.6 % — AB (ref 11.5–15.5)
WBC: 5.9 10*3/uL (ref 4.0–10.5)

## 2016-08-15 LAB — ECHOCARDIOGRAM COMPLETE
HEIGHTINCHES: 73 in
Weight: 5132.8 oz

## 2016-08-15 LAB — COOXEMETRY PANEL
CARBOXYHEMOGLOBIN: 3 % — AB (ref 0.5–1.5)
METHEMOGLOBIN: 0.7 % (ref 0.0–1.5)
O2 SAT: 83.6 %
TOTAL HEMOGLOBIN: 12.8 g/dL (ref 12.0–16.0)

## 2016-08-15 MED ORDER — FERROUS SULFATE 325 (65 FE) MG PO TABS
325.0000 mg | ORAL_TABLET | Freq: Every day | ORAL | Status: DC
  Administered 2016-08-15 – 2016-08-20 (×6): 325 mg via ORAL
  Filled 2016-08-15 (×6): qty 1

## 2016-08-15 MED ORDER — HEPARIN BOLUS VIA INFUSION
1500.0000 [IU] | Freq: Once | INTRAVENOUS | Status: AC
  Administered 2016-08-15: 1500 [IU] via INTRAVENOUS
  Filled 2016-08-15: qty 1500

## 2016-08-15 MED ORDER — PERFLUTREN LIPID MICROSPHERE
1.0000 mL | INTRAVENOUS | Status: AC | PRN
  Administered 2016-08-15: 6.5 mL via INTRAVENOUS
  Filled 2016-08-15: qty 10

## 2016-08-15 MED ORDER — HEPARIN (PORCINE) IN NACL 100-0.45 UNIT/ML-% IJ SOLN
2250.0000 [IU]/h | INTRAMUSCULAR | Status: DC
  Administered 2016-08-15 – 2016-08-16 (×2): 2250 [IU]/h via INTRAVENOUS
  Filled 2016-08-15 (×2): qty 250

## 2016-08-15 MED ORDER — PERFLUTREN LIPID MICROSPHERE
INTRAVENOUS | Status: AC
  Administered 2016-08-15: 6.5 mL via INTRAVENOUS
  Filled 2016-08-15: qty 10

## 2016-08-15 NOTE — Progress Notes (Signed)
Pt's CO2 has increased from 36 in the ED to 41 this AM. Pt alert and oriented x3. Pt has been on Venturi Mask @ 10L/45% since the ED. MD paged. Will continue to monitor.

## 2016-08-15 NOTE — Progress Notes (Signed)
  Echocardiogram 2D Echocardiogram has been performed.  Darlina Sicilian M 08/15/2016, 11:52 AM

## 2016-08-15 NOTE — Progress Notes (Signed)
PROGRESS NOTE    Jerry [name]  [id number] DOB: Dec 18, 1950 DOA: 08/14/2016  PCP: Tula Nakayama   Brief Narrative:  Jerry [name] is a 66 y.o. male with medical history significant of hypertension, hyperlipidemia, GERD, anxiety, tobacco abuse, CAD, chronic pain syndrome, osteosarcoma of rib (s/p of thoracotomy), dCHF, who presents with fall. Per his daughter, pt had an unwitnessed fall while he was walking, landed on the left side, causing a large L shaped laceration on his left leg. Pt reports he thinks he tripped on the carpet. In ED, pt was noted to have O2 Sat 79% which increased to 96% on 4L. Pt has cough with yellow colored sputum production, but no chest pain or shortness breath.  Subjective: Today does not complain of cough, dyspnea, chest pain. He was not able to stand due to pain in the left leg where he has the laceration.  Assessment & Plan:   Principal Problem:   Acute respiratory failure with hypoxia - unable to lay flat for CT chest due to severe back issues-  D Dimer 2.08- Venous duplex LE ordered- cont heparin infusion for now - ABG to ensure the pulse ox is accurate- check methemoglobin level - 2 D ECHO shows only grade 1 diastolic dysfunction- BNP normal- stop diuretics - stopped smoking 1 yr ago- ? Underlying COPD/ emphysema  Active Problems: Chronic pedal edema - likely venous stasis- no mention of RV failure on ECHO - has received a few doses of Lasix and is about 3 L neg balance - hold Lasix and follow    CAD (coronary artery disease) - cont ASA and statin    Fall - PT eval- currently can't walk due to pain in left leg (laceration)  Chronic pain - has Morphine pump and Neurontin due to back pain from Thoracotomy   DVT prophylaxis: Heparin Code Status: Full code Family Communication: none Disposition Plan:  Con to follow  Consultants:    Procedures:    Antimicrobials:  Anti-infectives    None       Objective: Vitals:   08/15/16 0330 08/15/16 0637 08/15/16 0744 08/15/16 0850  BP:    (!) 123/48  Pulse: 62 66  70  Resp:    18  Temp:      TempSrc:      SpO2: 91% 91% 91% 95%  Weight:      Height:        Intake/Output Summary (Last 24 hours) at 08/15/16 1256 Last data filed at 08/15/16 1051  Gross per 24 hour  Intake          1374.07 ml  Output             4975 ml  Net         -3600.93 ml   Filed Weights   08/14/16 1333 08/14/16 2338  Weight: 131.5 kg (290 lb) (!) 145.5 kg (320 lb 12.8 oz)    Examination: General exam: Appears comfortable  HEENT: PERRLA, oral mucosa moist, no sclera icterus or thrush Respiratory system: Clear to auscultation. Respiratory effort normal. Cardiovascular system: S1 & S2 heard, RRR.  No murmurs  Gastrointestinal system: Abdomen soft, non-tender, nondistended. Normal bowel sound. No organomegaly Central nervous system: Alert and oriented. No focal neurological deficits. Extremities: No cyanosis, clubbing- 2+ pedal edema Skin: No rashes or ulcers Psychiatry:  Mood & affect appropriate.     Data Reviewed: I have personally reviewed following labs and imaging studies  CBC:  Recent Labs Lab 08/14/16 1604 08/15/16 0805  WBC 6.5 5.9  NEUTROABS 4.1  --   HGB 12.4* 12.8*  HCT 42.6 44.7  MCV 95.3 97.4  PLT 143* [id number]*   Basic Metabolic Panel:  Recent Labs Lab 08/14/16 1530 08/15/16 0205  NA 138 140  K 5.2* 3.7  CL 94* 93*  CO2 36* 41*  GLUCOSE 106* 137*  BUN 8 7  CREATININE 0.95 0.89  CALCIUM 8.2* 8.3*   GFR: Estimated Creatinine Clearance: 124.2 mL/min (by C-G formula based on SCr of 0.89 mg/dL). Liver Function Tests:  Recent Labs Lab 08/14/16 1530  AST 57*  ALT 20  ALKPHOS 54  BILITOT 0.9  PROT 6.9  ALBUMIN 3.0*   No results for input(s): LIPASE, AMYLASE in the last 168 hours. No results for input(s): AMMONIA in the last 168 hours. Coagulation Profile: No results for input(s): INR, PROTIME in the last 168 hours. Cardiac  Enzymes:  Recent Labs Lab 08/14/16 2153 08/15/16 0205 08/15/16 0805  TROPONINI <0.03 <0.03 <0.03   BNP (last 3 results) No results for input(s): PROBNP in the last 8760 hours. HbA1C: No results for input(s): HGBA1C in the last 72 hours. CBG: No results for input(s): GLUCAP in the last 168 hours. Lipid Profile: No results for input(s): CHOL, HDL, LDLCALC, TRIG, CHOLHDL, LDLDIRECT in the last 72 hours. Thyroid Function Tests: No results for input(s): TSH, T4TOTAL, FREET4, T3FREE, THYROIDAB in the last 72 hours. Anemia Panel: No results for input(s): VITAMINB12, FOLATE, FERRITIN, TIBC, IRON, RETICCTPCT in the last 72 hours. Urine analysis:    Component Value Date/Time   COLORURINE YELLOW 06/10/2015 1106   APPEARANCEUR TURBID (A) 06/10/2015 1106   LABSPEC 1.011 06/10/2015 1106   PHURINE 7.0 06/10/2015 1106   GLUCOSEU NEGATIVE 06/10/2015 1106   HGBUR LARGE (A) 06/10/2015 1106   BILIRUBINUR NEGATIVE 06/10/2015 1106   KETONESUR NEGATIVE 06/10/2015 1106   PROTEINUR 30 (A) 06/10/2015 1106   UROBILINOGEN 1.0 06/10/2015 1106   NITRITE POSITIVE (A) 06/10/2015 1106   LEUKOCYTESUR LARGE (A) 06/10/2015 1106   Sepsis Labs: @LABRCNTIP (procalcitonin:4,lacticidven:4) )No results found for this or any previous visit (from the past 240 hour(s)).       Radiology Studies: Dg Chest 2 View  Result Date: 08/14/2016 CLINICAL DATA:  Trip and fall while walking dog, initial encounter EXAM: CHEST  2 VIEW COMPARISON:  11/29/2015, 05/22/2016 FINDINGS: Cardiac shadow is enlarged. Bibasilar atelectatic changes are again seen. Blunting of right costophrenic angle is noted of a chronic nature. No pneumothorax is seen. No acute bony abnormality is noted. IMPRESSION: Chronic changes without acute abnormality. Electronically Signed   By: Inez Catalina M.D.   On: 08/14/2016 15:10   Dg Thoracic Spine 2 View  Result Date: 08/14/2016 CLINICAL DATA:  Recent fall while walking dog with back pain, initial  encounter EXAM: THORACIC SPINE 2 VIEWS COMPARISON:  None. FINDINGS: No compression deformities are identified. Degenerative changes of the thoracic spine are seen. No paraspinal mass lesion is noted. IMPRESSION: Degenerative change without acute abnormality. Electronically Signed   By: Inez Catalina M.D.   On: 08/14/2016 15:17   Dg Tibia/fibula Left  Result Date: 08/14/2016 CLINICAL DATA:  Fall wall walking dog with left leg pain, initial encounter EXAM: LEFT TIBIA AND FIBULA - 2 VIEW COMPARISON:  None. FINDINGS: Soft tissue changes are noted in the mid calf anteriorly consistent with the recent injury. Degenerative changes about the knee joint and ankle joint are seen. No acute fracture or dislocation is noted. IMPRESSION: Degenerative changes without acute bony abnormality. Electronically Signed  By: Inez Catalina M.D.   On: 08/14/2016 15:11      Scheduled Meds: . aspirin EC  81 mg Oral Daily  . bisacodyl  10 mg Oral BID  . dextromethorphan-guaiFENesin  1 tablet Oral BID  . doxazosin  1 mg Oral QPM  . ferrous sulfate  325 mg Oral Q breakfast  . furosemide  60 mg Intravenous BID  . gabapentin  300 mg Oral TID  . mometasone-formoterol  2 puff Inhalation BID  . multivitamin with minerals   Oral Daily  . pantoprazole  40 mg Oral BID  . pravastatin  20 mg Oral QHS  . senna-docusate  2 tablet Oral BID  . sodium chloride flush  3 mL Intravenous Q12H  . sodium polystyrene  30 g Oral Once  . traZODone  225 mg Oral QHS  . vitamin C  500 mg Oral Daily  . zinc sulfate  220 mg Oral Daily   Continuous Infusions: . heparin 2,000 Units/hr (08/15/16 1025)     LOS: 1 day    Time spent in minutes: [place], MD [facility] Pager: [email or web address] Password [email or web address] 08/15/2016, 12:56 PM

## 2016-08-15 NOTE — Progress Notes (Signed)
Pt switched over to [place] 6L 24mins spo2 90-94% will continue to monitor.

## 2016-08-15 NOTE — Progress Notes (Signed)
CO2 = 77.9  from ABG at 1PM today. Pt alert and oriented x3-4. Pt on 6L [place] with O2 sat at 94%. MD, rapid response RN, and respiratory made aware. Will continue to monitor.

## 2016-08-15 NOTE — Progress Notes (Signed)
   08/15/16 1200  Clinical Encounter Type  Visited With Patient  Visit Type Initial;Spiritual support  Referral From Nurse  Consult/Referral To Chaplain  Spiritual Encounters  Spiritual Needs Prayer  Stress Factors  Patient Stress Factors Health changes    Chaplain responded to [place] consult, provided emotional presence and prayer

## 2016-08-15 NOTE — Progress Notes (Addendum)
ANTICOAGULATION CONSULT NOTE - Follow Up Consult  Pharmacy Consult for Heparin  Indication: rule out PE  Allergies  Allergen Reactions  . Ciprofloxacin Anaphylaxis  . Penicillins Rash    Has patient had a PCN reaction causing immediate rash, facial/tongue/throat swelling, SOB or lightheadedness with hypotension:unsure Has patient had a PCN reaction causing severe rash involving mucus membranes or skin necrosis:unsure Has patient had a PCN reaction that required hospitalization:unsure Has patient had a PCN reaction occurring within the last 10 years:NO If all of the above answers are "NO", then may proceed with Cephalosporin use.   . Tramadol Hcl Rash    Patient Measurements: Height: 6\' 1"  (185.4 cm) Weight: (!) 320 lb 12.8 oz (145.5 kg) IBW/kg (Calculated) : 79.9  Vital Signs: BP: 123/48 (01/10 0850) Pulse Rate: 70 (01/10 0850)  Labs:  Recent Labs  08/14/16 1530 08/14/16 1604 08/14/16 2153 08/15/16 0205 08/15/16 0805 08/15/16 1321  HGB  --  12.4*  --   --  12.8*  --   HCT  --  42.6  --   --  44.7  --   PLT  --  143*  --   --  127*  --   HEPARINUNFRC  --   --   --  0.13*  --  0.23*  CREATININE 0.95  --   --  0.89  --   --   TROPONINI  --   --  <0.03 <0.03 <0.03  --     Estimated Creatinine Clearance: 124.2 mL/min (by C-G formula based on SCr of 0.89 mg/dL).   Assessment: Admitted due to fall. Question of possible PE. Unable to do CT in patient due to chronic back issues. Empiric IV heparin infusion started.  Heparin rate increased to 2000 units/hr Next 8h heparin level = 0.23 on 2000 units/hr  Hgb low/stable, plts 143>127k No bleeding noted.   Goal of Therapy:  Heparin level 0.3-0.7 units/ml Monitor platelets by anticoagulation protocol: Yes   Plan:  Give bolus heparin 1500 units IV x1  Increase heparin drip rate to 2250 units/hr Check heparin level in 6 hours Daily heparin level, CBC   Nicole Cella, RPh Clinical Pharmacist Pager:  [phone] 08/15/2016,3:22 PM

## 2016-08-15 NOTE — Progress Notes (Signed)
ANTICOAGULATION CONSULT NOTE - Follow Up Consult  Pharmacy Consult for Heparin  Indication: rule out PE  Allergies  Allergen Reactions  . Ciprofloxacin Anaphylaxis  . Penicillins Rash    Has patient had a PCN reaction causing immediate rash, facial/tongue/throat swelling, SOB or lightheadedness with hypotension:unsure Has patient had a PCN reaction causing severe rash involving mucus membranes or skin necrosis:unsure Has patient had a PCN reaction that required hospitalization:unsure Has patient had a PCN reaction occurring within the last 10 years:NO If all of the above answers are "NO", then may proceed with Cephalosporin use.   . Tramadol Hcl Rash    Patient Measurements: Height: 6\' 1"  (185.4 cm) Weight: (!) 320 lb 12.8 oz (145.5 kg) IBW/kg (Calculated) : 79.9  Vital Signs: Temp: 98.6 F (37 C) (01/09 2338) Temp Source: Oral (01/09 2338) BP: 129/47 (01/09 2338) Pulse Rate: 66 (01/10 0121)  Labs:  Recent Labs  08/14/16 1530 08/14/16 1604 08/14/16 2153 08/15/16 0205  HGB  --  12.4*  --   --   HCT  --  42.6  --   --   PLT  --  143*  --   --   HEPARINUNFRC  --   --   --  0.13*  CREATININE 0.95  --   --  0.89  TROPONINI  --   --  <0.03 <0.03    Estimated Creatinine Clearance: 124.2 mL/min (by C-G formula based on SCr of 0.89 mg/dL).   Assessment: Admitted due to fall. Question of possible PE. Unable to do CT in patient due to chronic back issues. Planning to start empiric heparin. H/h 12.4/42, plts 143  1/10 AM: initial heparin level is low  Goal of Therapy:  Heparin level 0.3-0.7 units/ml Monitor platelets by anticoagulation protocol: Yes   Plan:  -Inc heparin to 2000 units/hr -1100 HL  [name] 08/15/2016,3:01 AM

## 2016-08-15 NOTE — Progress Notes (Signed)
Pt with acute urinary retention. Pt's straight cath upon admission last night revealed 3083mL urine after >999 mL upon bladder scan. Pt's straight cath this AM revealed 1175 mL urine after >900 mL upon bladder scan. Will continue to monitor.

## 2016-08-15 NOTE — Progress Notes (Signed)
Pt easily arouse to to name on veri mask at 10L lying in bed.

## 2016-08-15 NOTE — Progress Notes (Signed)
Pt admitted to 3e09 on Venturi Mask @ 10L, 45%. Pt c/o bladder pain and inability to urinate. Bladder scan = at least 985mL.Pt straight cathed with 3032mL output. Will continue to monitor.

## 2016-08-15 NOTE — Progress Notes (Addendum)
Pt was order AGB on room air. RT went to room and placed pt on room air. RN came and got RT after 02 was off 2 minutes.Pt sats was 80% on room air. RT ask RN to place pt back on 02 and call MD to talk about ABG order if MD still wants ABG on or off 02. RT talked to MD regarding ABG and correct P02. MD insist RT to draw ABG with pt off 02 less then 10 mins. Pt was off 02 for 5 mins sats at 86%. Family at bedside pt had no adverse reaction to therapy.

## 2016-08-15 NOTE — Consult Note (Signed)
[place] Nurse wound consult note Reason for Consult: Consult requested for left leg.  Pt fell prior to admission and laceration was repaired in the ER with staples.  Wound type: Left posterior calf with full thickness laceration Drainage (amount, consistency, odor) Staples intact and well-approximated.  Small amt blood-tinged drainage, no odor or active bleeding. Dressing procedure/placement/frequency: Pt will need a followup apt with a physician in 7-10 days to remove staples. Xeroform gauze to protect site with ABD pad and kerlex. Discussed plan of care with patient and he verbalized understanding. Please re-consult if further assistance is needed.  Thank-you,  Julien Girt MSN, [place], [place], [place], [place]

## 2016-08-15 NOTE — Progress Notes (Addendum)
Critical value called ABG results MD made aware no changes at this time. Will continue to monitor patient. Pt Alert and Oriinted No respiratory distress.

## 2016-08-15 NOTE — Progress Notes (Signed)
CM following for DCP; patient is currently on Venturi Mask; CM will continue to follow for DCP; B Pennie Rushing [phone]

## 2016-08-15 NOTE — Evaluation (Signed)
Physical Therapy Evaluation Patient Details Name: Jerry [name] MRN: [id number] DOB: 10-02-1950 Today's Date: 08/15/2016   History of Present Illness  66 y.o.malewho presents following a fall at home with laceration to Lt LE. Pt now in acute respiratory failure with hypoxia. PMH: hypertension, hyperlipidemia, GERD, anxiety, tobacco abuse, CAD, chronic pain syndrome, osteosarcoma of rib(s/p of thoracotomy), dCHF,  Rt TKA, back surgery.   Clinical Impression  Pt presenting with decreased mobility and activity tolerance during PT session. At this time the pt is requiring mod assist to transition from sitting to supine. Standing was attempted but pt unable to achieve standing due to LLE pain. SpO2 in 90s throughout session on Venturi mask. The pt states that he was independent with ADLs prior to admission and is planning to return home with his son and son's girlfriend to assist. Pt states that someone is typically always home to assist him if needed. PT to continue to follow and progress mobility in anticipation of D/c to home.     Follow Up Recommendations Home health PT;Supervision for mobility/OOB    Equipment Recommendations  Rolling walker with 5" wheels    Recommendations for Other Services       Precautions / Restrictions Precautions Precautions: Fall Precaution Comments: Venturi Mask  Restrictions Weight Bearing Restrictions: No      Mobility  Bed Mobility Overal bed mobility: Needs Assistance Bed Mobility: Supine to Sit     Supine to sit: Min assist (HOB elevated, pt using rails to assist.) Sit to supine: Mod assist (mod assist needed with Lt LE)   General bed mobility comments: Pt able to scoot laterally up  bed while sitting without assistance.   Transfers Overall transfer level: Needs assistance Equipment used: Rolling walker (2 wheeled) Transfers: Sit to/from Stand           General transfer comment: Attempted sit/stand from EOB but pt unable to  perform with reports of Lt LE pain as limitation.   Ambulation/Gait                Stairs            Wheelchair Mobility    Modified Rankin (Stroke Patients Only)       Balance Overall balance assessment: Needs assistance Sitting-balance support: No upper extremity supported Sitting balance-Leahy Scale: Fair                                       Pertinent Vitals/Pain Pain Assessment: 0-10 Pain Score: 7  Pain Location: back, Lt LE Pain Descriptors / Indicators: Sharp Pain Intervention(s): Limited activity within patient's tolerance;Monitored during session    [place] expects to be discharged to:: Private residence Living Arrangements: Children;Non-relatives/Friends Available Help at Discharge: Family;Available 24 hours/day Type of Home: House Home Access: Stairs to enter Entrance Stairs-Rails: Psychiatric nurse of Steps: 3 Home Layout: One level Home Equipment: Walker - standard;Cane - single point Additional Comments: pt lives with his son and son's girlfriend.     Prior Function Level of Independence: Independent with assistive device(s)         Comments: reports using cane for ambulation PTA     Hand Dominance        Extremity/Trunk Assessment   Upper Extremity Assessment Upper Extremity Assessment: Overall WFL for tasks assessed    Lower Extremity Assessment Lower Extremity Assessment: Generalized weakness       Communication  Communication: No difficulties  Cognition Arousal/Alertness: Awake/alert Behavior During Therapy: WFL for tasks assessed/performed Overall Cognitive Status: Within Functional Limits for tasks assessed                      General Comments      Exercises     Assessment/Plan    PT Assessment Patient needs continued PT services  PT Problem List Decreased strength;Decreased range of motion;Decreased activity tolerance;Decreased balance;Decreased  mobility          PT Treatment Interventions DME instruction;Gait training;Stair training;Functional mobility training;Therapeutic activities;Therapeutic exercise;Balance training;Patient/family education    PT Goals (Current goals can be found in the Care Plan section)  Acute Rehab PT Goals Patient Stated Goal: walk and get back home PT Goal Formulation: With patient Time For Goal Achievement: 08/29/16 Potential to Achieve Goals: Good    Frequency Min 3X/week   Barriers to discharge        Co-evaluation               End of Session Equipment Utilized During Treatment: Oxygen Activity Tolerance: Patient limited by pain Patient left: in bed;with call bell/phone within reach;with bed alarm set Nurse Communication: Mobility status         Time: 0935-1000 PT Time Calculation (min) (ACUTE ONLY): 25 min   Charges:   PT Evaluation $PT Eval Moderate Complexity: 1 Procedure PT Treatments $Therapeutic Activity: 8-22 mins   PT G Codes:        Cassell Clement, PT, CSCS Pager [phone] Office [phone]  08/15/2016, 10:17 AM

## 2016-08-16 ENCOUNTER — Inpatient Hospital Stay (HOSPITAL_COMMUNITY): Payer: Medicare HMO

## 2016-08-16 ENCOUNTER — Encounter (HOSPITAL_COMMUNITY): Payer: Medicare HMO

## 2016-08-16 DIAGNOSIS — R0602 Shortness of breath: Secondary | ICD-10-CM

## 2016-08-16 LAB — CBC
HCT: 42.6 % (ref 39.0–52.0)
HEMOGLOBIN: 12.3 g/dL — AB (ref 13.0–17.0)
MCH: 28.1 pg (ref 26.0–34.0)
MCHC: 28.9 g/dL — AB (ref 30.0–36.0)
MCV: 97.3 fL (ref 78.0–100.0)
Platelets: 149 10*3/uL — ABNORMAL LOW (ref 150–400)
RBC: 4.38 MIL/uL (ref 4.22–5.81)
RDW: 17.2 % — ABNORMAL HIGH (ref 11.5–15.5)
WBC: 6.1 10*3/uL (ref 4.0–10.5)

## 2016-08-16 LAB — BASIC METABOLIC PANEL
Anion gap: 11 (ref 5–15)
BUN: 9 mg/dL (ref 6–20)
CALCIUM: 8.1 mg/dL — AB (ref 8.9–10.3)
CHLORIDE: 92 mmol/L — AB (ref 101–111)
CO2: 36 mmol/L — AB (ref 22–32)
CREATININE: 1.01 mg/dL (ref 0.61–1.24)
GFR calc Af Amer: >60 mL/min (ref >60)
GFR calc non Af Amer: >60 mL/min (ref >60)
GLUCOSE: 106 mg/dL — AB (ref 65–99)
Potassium: 4.2 mmol/L (ref 3.5–5.1)
Sodium: 139 mmol/L (ref 135–145)

## 2016-08-16 LAB — HEPARIN LEVEL (UNFRACTIONATED)
HEPARIN UNFRACTIONATED: 0.48 [IU]/mL (ref 0.30–0.70)
Heparin Unfractionated: 0.36 IU/mL (ref 0.30–0.70)

## 2016-08-16 MED ORDER — AZITHROMYCIN 500 MG PO TABS
500.0000 mg | ORAL_TABLET | Freq: Every day | ORAL | Status: AC
  Administered 2016-08-16: 500 mg via ORAL
  Filled 2016-08-16: qty 1

## 2016-08-16 MED ORDER — TECHNETIUM TC 99M DIETHYLENETRIAME-PENTAACETIC ACID: 30.0000 | Freq: Once | INTRAVENOUS | Status: DC | PRN

## 2016-08-16 MED ORDER — AZITHROMYCIN 250 MG PO TABS
250.0000 mg | ORAL_TABLET | ORAL | Status: DC
  Administered 2016-08-17 – 2016-08-19 (×3): 250 mg via ORAL
  Filled 2016-08-16 (×3): qty 1

## 2016-08-16 MED ORDER — DEXTROSE 5 % IV SOLN
1.0000 g | INTRAVENOUS | Status: DC
  Administered 2016-08-16 – 2016-08-19 (×4): 1 g via INTRAVENOUS
  Filled 2016-08-16 (×7): qty 10

## 2016-08-16 MED ORDER — BISACODYL 10 MG RE SUPP
10.0000 mg | Freq: Once | RECTAL | Status: AC
  Administered 2016-08-16: 10 mg via RECTAL
  Filled 2016-08-16: qty 1

## 2016-08-16 NOTE — Progress Notes (Addendum)
PROGRESS NOTE    Jerry [name]  [id number] DOB: 08-09-1950 DOA: 08/14/2016  PCP: Tula Nakayama   Brief Narrative:  Jerry [name] is a 66 y.o. male with medical history significant of hypertension, hyperlipidemia, GERD, anxiety, tobacco abuse, CAD, chronic pain syndrome, osteosarcoma of rib (s/p of thoracotomy), dCHF, who presents with fall. Per his daughter, pt had an unwitnessed fall while he was walking, landed on the left side, causing a large L shaped laceration on his left leg. Pt reports he thinks he tripped on the carpet. In ED, pt was noted to have O2 Sat 79% which increased to 96% on 4L. Noted to have severe b/l lower extermity edema and a mild cough.    Subjective: No chest pain, dyspnea - has cough with yellow sputum today.   Assessment & Plan:   Principal Problem:   Acute respiratory failure with hypoxia - mild cough - requiring 6 L O2 - CXR unrevealing --  D Dimer 2.08-  unable to lay flat for CT chest due to severe back issues- Venous duplex LE ordered and still pending- cont heparin infusion for now- will try to obtain VQ today with him at a 45 degree elevation - stopped smoking 1 yr ago but smoked for at least 17 yrs on and off - ? Underlying COPD/ emphysema Addendum: VQ negative for PE but showing patchy ventilatory defects bilaterally- -  stop Heparin - CXR  Read suggesting CHF- However, 2 D ECHO shows only grade 1 diastolic dysfunction- BNP normal- no crackles on lung exam- stopped diuretics - start antibiotics to cover for pneumonia due to cough and yellow sputum- allergic to PCN (rash) and Cipro (anaphaylaxis)- start Rocephin and Zithro   Active Problems: Chronic pedal edema - per patient he has had pedal edema for > 1 yr - likely venous stasis- no mention of LV or RV failure on ECHO - has received a few doses of Lasix and was about 3 L neg balance - hold Lasix and follow - venous duplex negative for DVT  Thrombocytopenia - mild - appears  chronic-    CAD (coronary artery disease) - cont ASA and statin    Fall - PT eval- currently can't walk due to pain in left leg (laceration)- PT eval  Chronic pain - has Morphine pump and Neurontin due to back pain from Thoracotomy   DVT prophylaxis: Heparin Code Status: Full code Family Communication: none Disposition Plan:  Cont to follow  Consultants:    Procedures:    Antimicrobials:  Anti-infectives    None       Objective: Vitals:   08/16/16 0628 08/16/16 0744 08/16/16 0817 08/16/16 1343  BP: (!) 109/42 (!) 100/47  (!) 109/54  Pulse: 67 71 68 65  Resp: 18 18 18 18   Temp: 98.5 F (36.9 C) 98.9 F (37.2 C)  98.7 F (37.1 C)  TempSrc: Oral Oral  Oral  SpO2:  94% 96% 100%  Weight: (!) 140.9 kg (310 lb 11.2 oz)     Height:        Intake/Output Summary (Last 24 hours) at 08/16/16 1429 Last data filed at 08/16/16 1130  Gross per 24 hour  Intake          1713.13 ml  Output              250 ml  Net          1463.13 ml   Filed Weights   08/14/16 1333 08/14/16 2338 08/16/16 0628  Weight:  131.5 kg (290 lb) (!) 145.5 kg (320 lb 12.8 oz) (!) 140.9 kg (310 lb 11.2 oz)    Examination: General exam: Appears comfortable  HEENT: PERRLA, oral mucosa moist, no sclera icterus or thrush Respiratory system: Clear to auscultation. Respiratory effort normal. Cardiovascular system: S1 & S2 heard, RRR.  No murmurs  Gastrointestinal system: Abdomen soft, non-tender, nondistended. Normal bowel sound. No organomegaly Central nervous system: Alert and oriented. No focal neurological deficits. Extremities: No cyanosis, clubbing- 2+ pedal edema Skin: No rashes or ulcers Psychiatry:  Mood & affect appropriate.     Data Reviewed: I have personally reviewed following labs and imaging studies  CBC:  Recent Labs Lab 08/14/16 1604 08/15/16 0805 08/16/16 0347  WBC 6.5 5.9 6.1  NEUTROABS 4.1  --   --   HGB 12.4* 12.8* 12.3*  HCT 42.6 44.7 42.6  MCV 95.3 97.4 97.3  PLT  143* 127* [id number]*   Basic Metabolic Panel:  Recent Labs Lab 08/14/16 1530 08/15/16 0205 08/16/16 0347  NA 138 140 139  K 5.2* 3.7 4.2  CL 94* 93* 92*  CO2 36* 41* 36*  GLUCOSE 106* 137* 106*  BUN 8 7 9   CREATININE 0.95 0.89 1.01  CALCIUM 8.2* 8.3* 8.1*   GFR: Estimated Creatinine Clearance: 107.6 mL/min (by C-G formula based on SCr of 1.01 mg/dL). Liver Function Tests:  Recent Labs Lab 08/14/16 1530  AST 57*  ALT 20  ALKPHOS 54  BILITOT 0.9  PROT 6.9  ALBUMIN 3.0*   No results for input(s): LIPASE, AMYLASE in the last 168 hours. No results for input(s): AMMONIA in the last 168 hours. Coagulation Profile: No results for input(s): INR, PROTIME in the last 168 hours. Cardiac Enzymes:  Recent Labs Lab 08/14/16 2153 08/15/16 0205 08/15/16 0805  TROPONINI <0.03 <0.03 <0.03   BNP (last 3 results) No results for input(s): PROBNP in the last 8760 hours. HbA1C: No results for input(s): HGBA1C in the last 72 hours. CBG: No results for input(s): GLUCAP in the last 168 hours. Lipid Profile: No results for input(s): CHOL, HDL, LDLCALC, TRIG, CHOLHDL, LDLDIRECT in the last 72 hours. Thyroid Function Tests: No results for input(s): TSH, T4TOTAL, FREET4, T3FREE, THYROIDAB in the last 72 hours. Anemia Panel: No results for input(s): VITAMINB12, FOLATE, FERRITIN, TIBC, IRON, RETICCTPCT in the last 72 hours. Urine analysis:    Component Value Date/Time   COLORURINE YELLOW 06/10/2015 1106   APPEARANCEUR TURBID (A) 06/10/2015 1106   LABSPEC 1.011 06/10/2015 1106   PHURINE 7.0 06/10/2015 1106   GLUCOSEU NEGATIVE 06/10/2015 1106   HGBUR LARGE (A) 06/10/2015 1106   BILIRUBINUR NEGATIVE 06/10/2015 1106   KETONESUR NEGATIVE 06/10/2015 1106   PROTEINUR 30 (A) 06/10/2015 1106   UROBILINOGEN 1.0 06/10/2015 1106   NITRITE POSITIVE (A) 06/10/2015 1106   LEUKOCYTESUR LARGE (A) 06/10/2015 1106   Sepsis Labs: @LABRCNTIP (procalcitonin:4,lacticidven:4) )No results found for this or  any previous visit (from the past 240 hour(s)).       Radiology Studies: Dg Chest 2 View  Result Date: 08/14/2016 CLINICAL DATA:  Trip and fall while walking dog, initial encounter EXAM: CHEST  2 VIEW COMPARISON:  11/29/2015, 05/22/2016 FINDINGS: Cardiac shadow is enlarged. Bibasilar atelectatic changes are again seen. Blunting of right costophrenic angle is noted of a chronic nature. No pneumothorax is seen. No acute bony abnormality is noted. IMPRESSION: Chronic changes without acute abnormality. Electronically Signed   By: Inez Catalina M.D.   On: 08/14/2016 15:10   Dg Thoracic Spine 2 View  Result  Date: 08/14/2016 CLINICAL DATA:  Recent fall while walking dog with back pain, initial encounter EXAM: THORACIC SPINE 2 VIEWS COMPARISON:  None. FINDINGS: No compression deformities are identified. Degenerative changes of the thoracic spine are seen. No paraspinal mass lesion is noted. IMPRESSION: Degenerative change without acute abnormality. Electronically Signed   By: Inez Catalina M.D.   On: 08/14/2016 15:17   Dg Tibia/fibula Left  Result Date: 08/14/2016 CLINICAL DATA:  Fall wall walking dog with left leg pain, initial encounter EXAM: LEFT TIBIA AND FIBULA - 2 VIEW COMPARISON:  None. FINDINGS: Soft tissue changes are noted in the mid calf anteriorly consistent with the recent injury. Degenerative changes about the knee joint and ankle joint are seen. No acute fracture or dislocation is noted. IMPRESSION: Degenerative changes without acute bony abnormality. Electronically Signed   By: Inez Catalina M.D.   On: 08/14/2016 15:11      Scheduled Meds: . aspirin EC  81 mg Oral Daily  . bisacodyl  10 mg Oral BID  . dextromethorphan-guaiFENesin  1 tablet Oral BID  . doxazosin  1 mg Oral QPM  . ferrous sulfate  325 mg Oral Q breakfast  . gabapentin  300 mg Oral TID  . mometasone-formoterol  2 puff Inhalation BID  . multivitamin with minerals   Oral Daily  . pantoprazole  40 mg Oral BID  .  pravastatin  20 mg Oral QHS  . senna-docusate  2 tablet Oral BID  . sodium chloride flush  3 mL Intravenous Q12H  . sodium polystyrene  30 g Oral Once  . traZODone  225 mg Oral QHS  . vitamin C  500 mg Oral Daily  . zinc sulfate  220 mg Oral Daily   Continuous Infusions: . heparin 2,250 Units/hr (08/16/16 0838)     LOS: 2 days    Time spent in minutes: 42    [place], MD [facility] Pager: [email or web address] Password [facility] 08/16/2016, 2:29 PM

## 2016-08-16 NOTE — Progress Notes (Signed)
ANTICOAGULATION CONSULT NOTE - Follow Up Consult  Pharmacy Consult for Heparin  Indication: rule out PE  Allergies  Allergen Reactions  . Ciprofloxacin Anaphylaxis  . Penicillins Rash    Has patient had a PCN reaction causing immediate rash, facial/tongue/throat swelling, SOB or lightheadedness with hypotension:unsure Has patient had a PCN reaction causing severe rash involving mucus membranes or skin necrosis:unsure Has patient had a PCN reaction that required hospitalization:unsure Has patient had a PCN reaction occurring within the last 10 years:NO If all of the above answers are "NO", then may proceed with Cephalosporin use.   . Tramadol Hcl Rash    Patient Measurements: Height: 6\' 1"  (185.4 cm) Weight: (!) 320 lb 12.8 oz (145.5 kg) IBW/kg (Calculated) : 79.9  Vital Signs: Temp: 99.1 F (37.3 C) (01/11 0049) Temp Source: Oral (01/11 0049) BP: 104/51 (01/11 0049) Pulse Rate: 68 (01/11 0049)  Labs:  Recent Labs  08/14/16 1530 08/14/16 1604 08/14/16 2153 08/15/16 0205 08/15/16 0805 08/15/16 1321 08/16/16 0103  HGB  --  12.4*  --   --  12.8*  --   --   HCT  --  42.6  --   --  44.7  --   --   PLT  --  143*  --   --  127*  --   --   HEPARINUNFRC  --   --   --  0.13*  --  0.23* 0.48  CREATININE 0.95  --   --  0.89  --   --   --   TROPONINI  --   --  <0.03 <0.03 <0.03  --   --     Estimated Creatinine Clearance: 124.2 mL/min (by C-G formula based on SCr of 0.89 mg/dL).   Assessment: 66 y.o. M on heparin for possible PE. Unable to do CT in patient due to chronic back issues. Heparin level therapeutic (0.48) on gtt at 2250 units/hr. No bleeding noted.   Goal of Therapy:  Heparin level 0.3-0.7 units/ml Monitor platelets by anticoagulation protocol: Yes   Plan:  Continue heparin at 2250 units/hr  Will f/u 6 hr heparin level to confirm therapeutic  Sherlon Handing, PharmD, BCPS Clinical pharmacist, pager [phone] 08/16/2016,1:41 AM

## 2016-08-16 NOTE — Evaluation (Signed)
Occupational Therapy Evaluation Patient Details Name: COE SERIGHT MRN: [id number] DOB: 1951/02/24 Today's Date: 08/16/2016    History of Present Illness 66 y.o.malewho presents following a fall at home with laceration to Lt LE. Pt now in acute respiratory failure with hypoxia. PMH: hypertension, hyperlipidemia, GERD, anxiety, tobacco abuse, CAD, chronic pain syndrome, osteosarcoma of rib(s/p of thoracotomy), dCHF,  Rt TKA, back surgery.    Clinical Impression   Pt admitted as above currently requiring increased assistance with ADL's and self care tasks as well as functional mobility. Pt is +2 Mod A for sit to stand and able to take 3-4 side steps to [facility] this am. His O2 was noted to decrease into 70's despite being on 4+L O2 throughout session. Pt should benefit from acute OT followed by In-Pt Rehab consult to assist with maximizing independence with ADL's and return home with PRN family assist.    Follow Up Recommendations  CIR;Supervision/Assistance - 24 hour    Equipment Recommendations  None recommended by OT    Recommendations for Other Services Rehab consult     Precautions / Restrictions Precautions Precautions: Fall Precaution Comments: Venturi Mask  Restrictions Weight Bearing Restrictions: No      Mobility Bed Mobility Overal bed mobility: Needs Assistance (Pt sitting EOB upon OT arrival today) Bed Mobility: Sit to Supine       Sit to supine: Mod assist (w/ LLE)   General bed mobility comments: Pt able to scoot laterally up  bed while sitting without assistance.   Transfers Overall transfer level: Needs assistance Equipment used: Rolling walker (2 wheeled) Transfers: Sit to/from Stand Sit to Stand: Mod assist;+2 physical assistance;+2 safety/equipment         General transfer comment: Pt able to take several side steps to [facility] w/ increased time and VC's for safety/sequencing.    Balance Overall balance assessment: Needs  assistance Sitting-balance support: Single extremity supported;Feet supported Sitting balance-Leahy Scale: Fair     Standing balance support: Bilateral upper extremity supported Standing balance-Leahy Scale: Poor                              ADL Overall ADL's : Needs assistance/impaired Eating/Feeding: Independent;Set up;Sitting   Grooming: Wash/dry hands;Wash/dry face;Set up;Sitting   Upper Body Bathing: Set up;Sitting;Modified independent   Lower Body Bathing: Sit to/from stand;+2 for safety/equipment;+2 for physical assistance;Maximal assistance   Upper Body Dressing : Set up;Sitting;Modified independent   Lower Body Dressing: Maximal assistance;+2 for physical assistance;+2 for safety/equipment;Sit to/from stand   Toilet Transfer: Moderate assistance;+2 for physical assistance;+2 for safety/equipment;BSC;Stand-pivot;Requires wide/bariatric;RW   Toileting- Clothing Manipulation and Hygiene: Moderate assistance;+2 for physical assistance;+2 for safety/equipment;Sit to/from stand;Sitting/lateral lean       Functional mobility during ADLs: Moderate assistance;+2 for physical assistance;+2 for safety/equipment General ADL Comments: Pt seen for ADL retraining session after OT Assessment today. Pt requires increased time for all tasks. O2 SATs dropped into 70s after getting back to bed (while on O2 throughout session via nasal cannula 4+L). Will follow acutely for OT.     Vision  Wears glasses for reading only. No changes from baseline.   Perception     Praxis      Pertinent Vitals/Pain Pain Assessment: 0-10 Pain Score: 5  Pain Location: back, Lt LE Pain Descriptors / Indicators: Aching Pain Intervention(s): Limited activity within patient's tolerance;Monitored during session;Premedicated before session     Hand Dominance Right   Extremity/Trunk Assessment Upper Extremity Assessment Upper  Extremity Assessment: Generalized weakness;Overall [facility] for tasks  assessed   Lower Extremity Assessment Lower Extremity Assessment: Generalized weakness       Communication Communication Communication: No difficulties   Cognition Arousal/Alertness: Awake/alert Behavior During Therapy: WFL for tasks assessed/performed Overall Cognitive Status: Within Functional Limits for tasks assessed                     General Comments       Exercises       Shoulder Instructions      Home Living Family/patient expects to be discharged to:: Private residence Living Arrangements: Children;Non-relatives/Friends Available Help at Discharge: Family;Available 24 hours/day Type of Home: House Home Access: Stairs to enter [employer] of Steps: 3 Entrance Stairs-Rails: Right;Left Home Layout: One level     Bathroom Shower/Tub: Teacher, early years/pre: Handicapped height Bathroom Accessibility: Yes How Accessible: Accessible via walker Home Equipment: Walker - standard;Cane - single point;Hand held shower head;Grab bars - tub/shower;Bedside commode   Additional Comments: pt lives with his son and son's girlfriend.       Prior Functioning/Environment Level of Independence: Independent with assistive device(s)        Comments: reports using cane for ambulation PTA        OT Problem List: Decreased strength;Decreased knowledge of use of DME or AE;Decreased knowledge of precautions;Obesity;Decreased activity tolerance;Cardiopulmonary status limiting activity;Pain;Other (comment) (H/O falls)   OT Treatment/Interventions: Self-care/ADL training;Therapeutic exercise;Patient/family education;Energy conservation;Therapeutic activities;DME and/or AE instruction    OT Goals(Current goals can be found in the care plan section) Acute Rehab OT Goals Patient Stated Goal: Get better, I'm weak OT Goal Formulation: With patient Time For Goal Achievement: 08/30/16 Potential to Achieve Goals: Fair  OT Frequency: Min 2X/week    Barriers to D/C:            Co-evaluation              End of Session Equipment Utilized During Treatment: Rolling walker;Oxygen Nurse Communication: Mobility status;Other (comment) (RN aware of decrease in O2 with physical activity)  Activity Tolerance: Patient tolerated treatment well;Other (comment) (Pt O2 SATs decreased into 70s while transferring back to bed and scooting to [facility]. O2 on at all times throughout session 4+L via nasal cannula)) Patient left: in bed;with call bell/phone within reach;with bed alarm set   Time: [id number] OT Time Calculation (min): 47 min Charges:  OT General Charges $OT Visit: 1 Procedure OT Evaluation $OT Eval Moderate Complexity: 1 Procedure OT Treatments $Self Care/Home Management : 23-37 mins G-Codes:    Almyra Deforest, OTR/L 08/16/2016, 9:48 AM

## 2016-08-16 NOTE — Progress Notes (Signed)
Called by night RN about critical blood gas, patient's PaCO@ was 77.9 at 1300 on day shift.  Patient has history of COPD and previous ABG were over a 66 year old, ABG reivewied with RT and TRH NP, ABG was compensated.  Patient is alert and oriented, follows commands, participates in IS and was able to get over 850.  Sats greater than 92% on 6L .    We placed the patient on continuous SpO2 through the tele box so that RNs would be notified and sats and HR can be trended.  Called at [place] for followup, patient still stable, no changes.

## 2016-08-16 NOTE — Progress Notes (Signed)
*  PRELIMINARY RESULTS* Vascular Ultrasound Bilateral lower extremity venous duplex has been completed.  Preliminary findings: No evidence of deep vein thrombosis in the visualized veins of the lower extremities.  Negative for baker's cysts bilaterally.   [name] 08/16/2016, 2:37 PM

## 2016-08-16 NOTE — Progress Notes (Signed)
Inpatient Rehabilitation  Per OT request patient was screened by Gunnar Fusi for appropriateness for an Inpatient Acute Rehab consult.  Not sure if insurance will authorize and IP Rehab stay for this diagnosis; however, if MD would like [place] to pursue please order an IP Rehab consult.    Carmelia Roller., CCC/SLP Admission Coordinator  [place]  Cell [phone]

## 2016-08-16 NOTE — Progress Notes (Signed)
ANTICOAGULATION CONSULT NOTE - Follow Up Consult  Pharmacy Consult for Heparin  Indication: rule out PE  Allergies  Allergen Reactions  . Ciprofloxacin Anaphylaxis  . Penicillins Rash    Has patient had a PCN reaction causing immediate rash, facial/tongue/throat swelling, SOB or lightheadedness with hypotension:unsure Has patient had a PCN reaction causing severe rash involving mucus membranes or skin necrosis:unsure Has patient had a PCN reaction that required hospitalization:unsure Has patient had a PCN reaction occurring within the last 10 years:NO If all of the above answers are "NO", then may proceed with Cephalosporin use.   . Tramadol Hcl Rash    Patient Measurements: Height: 6\' 1"  (185.4 cm) Weight: (!) 310 lb 11.2 oz (140.9 kg) IBW/kg (Calculated) : 79.9  Vital Signs: Temp: 98.9 F (37.2 C) (01/11 0744) Temp Source: Oral (01/11 0744) BP: 100/47 (01/11 0744) Pulse Rate: 68 (01/11 0817)  Labs:  Recent Labs  08/14/16 1530  08/14/16 1604 08/14/16 2153  08/15/16 0205 08/15/16 0805 08/15/16 1321 08/16/16 0103 08/16/16 0347 08/16/16 0749  HGB  --   < > 12.4*  --   --   --  12.8*  --   --  12.3*  --   HCT  --   --  42.6  --   --   --  44.7  --   --  42.6  --   PLT  --   --  143*  --   --   --  127*  --   --  149*  --   HEPARINUNFRC  --   --   --   --   < > 0.13*  --  0.23* 0.48  --  0.36  CREATININE 0.95  --   --   --   --  0.89  --   --   --  1.01  --   TROPONINI  --   --   --  <0.03  --  <0.03 <0.03  --   --   --   --   < > = values in this interval not displayed.  Estimated Creatinine Clearance: 107.6 mL/min (by C-G formula based on SCr of 1.01 mg/dL).   Assessment: 66 y.o. M on heparin for possible PE. Unable to do CT in patient due to chronic back issues. Heparin level remains therapeutic (0.36) on gtt at 2250 units/hr. No bleeding noted. Hgb low stable 12.3, PLTC 149k    Goal of Therapy:  Heparin level 0.3-0.7 units/ml Monitor platelets by  anticoagulation protocol: Yes     Plan:  Continue heparin at 2250 units/hr  Daily heparin level and CBC   Nicole Cella, RPh Clinical Pharmacist Pager: [phone] 8A-4P [phone] 4P-10P [phone] [place] [phone] 08/16/2016,10:12 AM

## 2016-08-17 LAB — BASIC METABOLIC PANEL
Anion gap: 10 (ref 5–15)
BUN: 5 mg/dL — AB (ref 6–20)
CALCIUM: 8.8 mg/dL — AB (ref 8.9–10.3)
CO2: 37 mmol/L — ABNORMAL HIGH (ref 22–32)
Chloride: 91 mmol/L — ABNORMAL LOW (ref 101–111)
Creatinine, Ser: 0.9 mg/dL (ref 0.61–1.24)
GFR calc Af Amer: >60 mL/min (ref >60)
GLUCOSE: 137 mg/dL — AB (ref 65–99)
Potassium: 3.9 mmol/L (ref 3.5–5.1)
Sodium: 138 mmol/L (ref 135–145)

## 2016-08-17 MED ORDER — POTASSIUM CHLORIDE CRYS ER 20 MEQ PO TBCR
20.0000 meq | EXTENDED_RELEASE_TABLET | Freq: Two times a day (BID) | ORAL | Status: DC
  Administered 2016-08-17 – 2016-08-20 (×7): 20 meq via ORAL
  Filled 2016-08-17 (×7): qty 1

## 2016-08-17 MED ORDER — FUROSEMIDE 10 MG/ML IJ SOLN
40.0000 mg | INTRAMUSCULAR | Status: DC
  Administered 2016-08-17 – 2016-08-18 (×4): 40 mg via INTRAVENOUS
  Filled 2016-08-17 (×4): qty 4

## 2016-08-17 MED ORDER — ENOXAPARIN SODIUM 40 MG/0.4ML ~~LOC~~ SOLN
40.0000 mg | SUBCUTANEOUS | Status: DC
  Administered 2016-08-17 – 2016-08-19 (×3): 40 mg via SUBCUTANEOUS
  Filled 2016-08-17 (×3): qty 0.4

## 2016-08-17 MED ORDER — ALBUTEROL SULFATE (2.5 MG/3ML) 0.083% IN NEBU: 2.5000 mg | INHALATION_SOLUTION | RESPIRATORY_TRACT | Status: DC | PRN

## 2016-08-17 MED ORDER — DIAZEPAM 2 MG PO TABS
2.0000 mg | ORAL_TABLET | Freq: Two times a day (BID) | ORAL | Status: DC | PRN
  Administered 2016-08-19 – 2016-08-20 (×2): 2 mg via ORAL
  Filled 2016-08-17 (×2): qty 1

## 2016-08-17 MED ORDER — ALBUTEROL SULFATE (2.5 MG/3ML) 0.083% IN NEBU: 2.5000 mg | INHALATION_SOLUTION | Freq: Four times a day (QID) | RESPIRATORY_TRACT | Status: DC

## 2016-08-17 NOTE — Progress Notes (Signed)
Physical Therapy Treatment Patient Details Name: Jerry [name] MRN: [id number] DOB: [birth year] Today's Date: 08/17/2016    History of Present Illness 66 y.o.malewho presents following a fall at home with laceration to Lt LE. Pt now in acute respiratory failure with hypoxia. PMH: hypertension, hyperlipidemia, GERD, anxiety, tobacco abuse, CAD, chronic pain syndrome, osteosarcoma of rib(s/p of thoracotomy), dCHF,  Rt TKA, back surgery.     PT Comments    Pt presented supine in bed with HOB elevated, awake and willing to participate in therapy session. Pt making good progress, and he was able to perform bed mobility and transfers with decreased assistance (min A). Pt would continue to benefit from skilled physical therapy services at this time while admitted and after d/c to address his limitations in order to improve his overall safety and independence with functional mobility.   Follow Up Recommendations  Home health PT;Supervision for mobility/OOB     Equipment Recommendations  Rolling walker with 5" wheels;Other (comment) (bariatric walker)    Recommendations for Other Services       Precautions / Restrictions Precautions Precautions: Fall Restrictions Weight Bearing Restrictions: No    Mobility  Bed Mobility Overal bed mobility: Needs Assistance Bed Mobility: Sit to Supine     Supine to sit: Min assist;HOB elevated     General bed mobility comments: pt required increased time, use of bed rail, HOB elevated, and min A with L LE movement  Transfers Overall transfer level: Needs assistance Equipment used: Rolling walker (2 wheeled) Transfers: Sit to/from [employer] Sit to Stand: Min assist Stand pivot transfers: Min assist       General transfer comment: pt required increased time, VC'ing for bilateral hand placement and min A to power up to achieve full standing position from bed. Min A for stability with pivotal movements  Ambulation/Gait                 Stairs            Wheelchair Mobility    Modified Rankin (Stroke Patients Only)       Balance Overall balance assessment: Needs assistance Sitting-balance support: Feet supported Sitting balance-Leahy Scale: Fair     Standing balance support: During functional activity;Bilateral upper extremity supported Standing balance-Leahy Scale: Poor Standing balance comment: pt reliant on bilateral UEs on RW                    Cognition Arousal/Alertness: Awake/alert Behavior During Therapy: WFL for tasks assessed/performed Overall Cognitive Status: Within Functional Limits for tasks assessed                      Exercises      General Comments        Pertinent Vitals/Pain Pain Assessment: No/denies pain    Home Living                      Prior Function            PT Goals (current goals can now be found in the care plan section) Acute Rehab PT Goals Patient Stated Goal: to get better PT Goal Formulation: With patient Time For Goal Achievement: 08/29/16 Potential to Achieve Goals: Good Progress towards PT goals: Progressing toward goals    Frequency    Min 3X/week      PT Plan Current plan remains appropriate    Co-evaluation  End of Session Equipment Utilized During Treatment: Gait belt;Oxygen Activity Tolerance: Patient limited by fatigue Patient left: in chair;with call bell/phone within reach;Other (comment) (bilateral LEs elevated)     Time: [id number] PT Time Calculation (min) (ACUTE ONLY): 13 min  Charges:  $Therapeutic Activity: 8-22 mins                    G CodesClearnce Sorrel Jamal Pavon 08-18-2016, 11:48 AM Sherie Don, PT, DPT [phone]

## 2016-08-17 NOTE — Care Management Important Message (Signed)
Important Message  Patient Details  Name: Jerry [name] MRN: [id number] Date of Birth: 06-28-1951   Medicare Important Message Given:  Yes    Ludivina Guymon Montine Circle 08/17/2016, 12:35 PM

## 2016-08-17 NOTE — Progress Notes (Signed)
Rise Patience, NT 1 + 3 confirmed that she was 2nd person inserting foley catheter on [name], Wednesday, 08/15/16, with Enos Fling, RN

## 2016-08-17 NOTE — Progress Notes (Signed)
PROGRESS NOTE    Jerry [name]  [id number] DOB: 1950/10/12 DOA: 08/14/2016  PCP: Tula Nakayama   Brief Narrative:  Jerry [name] is a 66 y.o. male with medical history significant of hypertension, hyperlipidemia, GERD, anxiety, tobacco abuse, CAD, chronic pain syndrome, osteosarcoma of rib (s/p of thoracotomy), dCHF, who presents with fall. Per his daughter, pt had an unwitnessed fall while he was walking, landed on the left side, causing a large L shaped laceration on his left leg. Pt reports he thinks he tripped on the carpet. In ED, pt was noted to have O2 Sat 79% which increased to 96% on 4L. Noted to have severe b/l lower extermity edema and a mild cough.    Subjective: No chest pain, dyspnea - continues to cough up sputum today.   Assessment & Plan:   Principal Problem:   Acute respiratory failure with hypoxia - requiring 6 L O2 --  D Dimer 2.08-  Heparin started empirically in ER- unable to lay flat for CT chest due to severe back issues -Venous duplex LE ordered and negative- VQ negative for PE but showing patchy ventilatory defects bilaterally- -  stopped Heparin 1/11 - 1/11- started antibiotics to cover for pneumonia due to cough and yellow sputum and patchy infiltrates on VQ- allergic to PCN (rash) and Cipro (anaphaylaxis)- started Rocephin and Zithro - no flu like symptoms - CXR  Read suggesting CHF- However, 2 D ECHO shows only grade 1 diastolic dysfunction-   - unfortunately have not been able to wean O2 yet- will start Lasix again today to see if this helps his hypoxia - continue incentive spirometry  - stopped smoking 1 yr ago but smoked for at least 17 yrs on and off - ? Underlying COPD/ emphysema   Active Problems: Chronic pedal edema - per patient he has had pedal edema for > 1 yr - likely venous stasis- no mention of LV or RV failure on ECHO - venous duplex negative for DVT  Thrombocytopenia - mild - appears chronic-    CAD (coronary artery  disease) - cont ASA and statin    Fall - PT eval- currently can't walk due to pain in left leg (laceration)- PT eval  Chronic pain - has Morphine pump and on Neurontin due to back pain from Thoracotomy   DVT prophylaxis: Heparin Code Status: Full code Family Communication: none Disposition Plan:  Cont to follow  Consultants:    Procedures:    Antimicrobials:  Anti-infectives    Start     Dose/Rate Route Frequency Ordered Stop   08/17/16 1700  azithromycin (ZITHROMAX) tablet 250 mg     250 mg Oral Every 24 hours 08/16/16 1638 08/21/16 1659   08/16/16 1700  cefTRIAXone (ROCEPHIN) 1 g in dextrose 5 % 50 mL IVPB     1 g 100 mL/hr over 30 Minutes Intravenous Every 24 hours 08/16/16 1638     08/16/16 1700  azithromycin (ZITHROMAX) tablet 500 mg     500 mg Oral Daily 08/16/16 1638 08/16/16 1813       Objective: Vitals:   08/16/16 1655 08/16/16 2024 08/16/16 2027 08/17/16 0555  BP:   (!) 122/54 (!) 107/45  Pulse: 64  72 65  Resp: 18  18 20   Temp: 98.4 F (36.9 C)  98.2 F (36.8 C) 98.7 F (37.1 C)  TempSrc: Oral  Oral Oral  SpO2:  96% 96% 95%  Weight:    (!) 140 kg (308 lb 9.6 oz)  Height:  Intake/Output Summary (Last 24 hours) at 08/17/16 1244 Last data filed at 08/17/16 0913  Gross per 24 hour  Intake             1680 ml  Output             1150 ml  Net              530 ml   Filed Weights   08/14/16 2338 08/16/16 0628 08/17/16 0555  Weight: (!) 145.5 kg (320 lb 12.8 oz) (!) 140.9 kg (310 lb 11.2 oz) (!) 140 kg (308 lb 9.6 oz)    Examination: General exam: Appears comfortable  HEENT: PERRLA, oral mucosa moist, no sclera icterus or thrush Respiratory system: mild crackles in LLL today-  Respiratory effort normal. Cardiovascular system: S1 & S2 heard, RRR.  No murmurs  Gastrointestinal system: Abdomen soft, non-tender, nondistended. Normal bowel sound. No organomegaly Central nervous system: Alert and oriented. No focal neurological  deficits. Extremities: No cyanosis, clubbing- 2+ pedal edema Skin: No rashes or ulcers Psychiatry:  Mood & affect appropriate.     Data Reviewed: I have personally reviewed following labs and imaging studies  CBC:  Recent Labs Lab 08/14/16 1604 08/15/16 0805 08/16/16 0347  WBC 6.5 5.9 6.1  NEUTROABS 4.1  --   --   HGB 12.4* 12.8* 12.3*  HCT 42.6 44.7 42.6  MCV 95.3 97.4 97.3  PLT 143* 127* 123456*   Basic Metabolic Panel:  Recent Labs Lab 08/14/16 1530 08/15/16 0205 08/16/16 0347 08/17/16 0854  NA 138 140 139 138  K 5.2* 3.7 4.2 3.9  CL 94* 93* 92* 91*  CO2 36* 41* 36* 37*  GLUCOSE 106* 137* 106* 137*  BUN 8 7 9  5*  CREATININE 0.95 0.89 1.01 0.90  CALCIUM 8.2* 8.3* 8.1* 8.8*   GFR: Estimated Creatinine Clearance: 120.3 mL/min (by C-G formula based on SCr of 0.9 mg/dL). Liver Function Tests:  Recent Labs Lab 08/14/16 1530  AST 57*  ALT 20  ALKPHOS 54  BILITOT 0.9  PROT 6.9  ALBUMIN 3.0*   No results for input(s): LIPASE, AMYLASE in the last 168 hours. No results for input(s): AMMONIA in the last 168 hours. Coagulation Profile: No results for input(s): INR, PROTIME in the last 168 hours. Cardiac Enzymes:  Recent Labs Lab 08/14/16 2153 08/15/16 0205 08/15/16 0805  TROPONINI <0.03 <0.03 <0.03   BNP (last 3 results) No results for input(s): PROBNP in the last 8760 hours. HbA1C: No results for input(s): HGBA1C in the last 72 hours. CBG: No results for input(s): GLUCAP in the last 168 hours. Lipid Profile: No results for input(s): CHOL, HDL, LDLCALC, TRIG, CHOLHDL, LDLDIRECT in the last 72 hours. Thyroid Function Tests: No results for input(s): TSH, T4TOTAL, FREET4, T3FREE, THYROIDAB in the last 72 hours. Anemia Panel: No results for input(s): VITAMINB12, FOLATE, FERRITIN, TIBC, IRON, RETICCTPCT in the last 72 hours. Urine analysis:    Component Value Date/Time   COLORURINE YELLOW 06/10/2015 1106   APPEARANCEUR TURBID (A) 06/10/2015 1106    LABSPEC 1.011 06/10/2015 1106   PHURINE 7.0 06/10/2015 1106   GLUCOSEU NEGATIVE 06/10/2015 1106   HGBUR LARGE (A) 06/10/2015 1106   BILIRUBINUR NEGATIVE 06/10/2015 1106   KETONESUR NEGATIVE 06/10/2015 1106   PROTEINUR 30 (A) 06/10/2015 1106   UROBILINOGEN 1.0 06/10/2015 1106   NITRITE POSITIVE (A) 06/10/2015 1106   LEUKOCYTESUR LARGE (A) 06/10/2015 1106   Sepsis Labs: @LABRCNTIP (procalcitonin:4,lacticidven:4) )No results found for this or any previous visit (from the past 240  hour(s)).   Radiology Studies: Dg Chest 1 View  Result Date: 08/16/2016 CLINICAL DATA:  Shortness of breath. EXAM: CHEST 1 VIEW COMPARISON:  08/15/2015. FINDINGS: Mediastinum and hilar structures normal. Cardiomegaly with bilateral from interstitial prominence and pleural effusions consistent congestive heart failure. Pneumonitis cannot be excluded. A component of underlying chronic interstitial lung disease most likely present. No pneumothorax. IMPRESSION: Congestive heart failure bilateral from interstitial edema and bilateral pleural effusions. Findings are new from prior exam. A component of underlying chronic interstitial lung disease most likely present. Electronically Signed   By: Marcello Moores  Register   On: 08/16/2016 16:18   Nm Pulmonary Perf And Vent  Result Date: 08/16/2016 CLINICAL DATA:  Shortness of breath. EXAM: NUCLEAR MEDICINE VENTILATION - PERFUSION LUNG SCAN TECHNIQUE: Ventilation images were obtained in multiple projections using inhaled aerosol Tc-81m DTPA. Perfusion images were obtained in multiple projections after intravenous injection of Tc-17m MAA. RADIOPHARMACEUTICALS:  31.0 MCi Technetium-78m DTPA aerosol inhalation and 4.2 mCi Technetium-56m MAA IV COMPARISON:  Chest x-ray 08/14/2016. FINDINGS: Ventilation: Patchy ventilatory defects are noted bilaterally. Perfusion: No significant perfusion mismatches are noted. IMPRESSION: Low probability pulmonary embolus. Electronically Signed   By: [place]   On: 08/16/2016 16:00      Scheduled Meds: . aspirin EC  81 mg Oral Daily  . azithromycin  250 mg Oral Q24H  . bisacodyl  10 mg Oral BID  . cefTRIAXone (ROCEPHIN)  IV  1 g Intravenous Q24H  . dextromethorphan-guaiFENesin  1 tablet Oral BID  . doxazosin  1 mg Oral QPM  . enoxaparin (LOVENOX) injection  40 mg Subcutaneous Q24H  . ferrous sulfate  325 mg Oral Q breakfast  . furosemide  40 mg Intravenous 3 times per day  . gabapentin  300 mg Oral TID  . mometasone-formoterol  2 puff Inhalation BID  . multivitamin with minerals   Oral Daily  . pantoprazole  40 mg Oral BID  . potassium chloride  20 mEq Oral BID  . pravastatin  20 mg Oral QHS  . senna-docusate  2 tablet Oral BID  . sodium polystyrene  30 g Oral Once  . traZODone  225 mg Oral QHS  . vitamin C  500 mg Oral Daily  . zinc sulfate  220 mg Oral Daily   Continuous Infusions:    LOS: 3 days    Time spent in minutes: [place], MD [facility] Pager: [email or web address] Password [facility] 08/17/2016, 12:44 PM

## 2016-08-18 LAB — BASIC METABOLIC PANEL
Anion gap: 10 (ref 5–15)
BUN: 9 mg/dL (ref 6–20)
CO2: 38 mmol/L — ABNORMAL HIGH (ref 22–32)
CREATININE: 0.93 mg/dL (ref 0.61–1.24)
Calcium: 8.8 mg/dL — ABNORMAL LOW (ref 8.9–10.3)
Chloride: 91 mmol/L — ABNORMAL LOW (ref 101–111)
GFR calc non Af Amer: >60 mL/min (ref >60)
Glucose, Bld: 115 mg/dL — ABNORMAL HIGH (ref 65–99)
Potassium: 4 mmol/L (ref 3.5–5.1)
SODIUM: 139 mmol/L (ref 135–145)

## 2016-08-18 MED ORDER — FUROSEMIDE 10 MG/ML IJ SOLN
40.0000 mg | Freq: Every day | INTRAMUSCULAR | Status: DC
  Administered 2016-08-19 – 2016-08-20 (×2): 40 mg via INTRAVENOUS
  Filled 2016-08-18 (×2): qty 4

## 2016-08-18 NOTE — Progress Notes (Addendum)
PROGRESS NOTE    Jerry [name]  [id number] DOB: Apr 08, 1951 DOA: 08/14/2016  PCP: Tula Nakayama   Brief Narrative:  Jerry [name] is a 66 y.o. male with medical history significant of hypertension, hyperlipidemia, GERD, anxiety, tobacco abuse, CAD, chronic pain syndrome, osteosarcoma of rib (s/p of thoracotomy), dCHF, who presents with fall. Per his daughter, pt had an unwitnessed fall while he was walking, landed on the left side, causing a large L shaped laceration on his left leg. Pt reports he thinks he tripped on the carpet. In ED, pt was noted to have O2 Sat 79% which increased to 96% on 4L. Noted to have severe b/l lower extermity edema and a mild cough.    Subjective: Still coughing up sputum. Ankle edema has improved.   Assessment & Plan:   Principal Problem:   Acute respiratory failure with hypoxia - requiring 6 L O2 on admission  --  D Dimer 2.08-  Heparin started empirically in ER- unable to lay flat for CT chest due to severe back issues -Venous duplex LE ordered and negative- VQ negative for PE but showing patchy ventilatory defects bilaterally- -  stopped Heparin 1/11 - 1/11- started antibiotics to cover for pneumonia due to cough and yellow sputum and patchy infiltrates on VQ- allergic to PCN (rash) and Cipro (anaphaylaxis)- started Rocephin and Zithro - no flu like symptoms - CXR  Read suggesting CHF- However, 2 D ECHO shows only grade 1 diastolic dysfunction-   - due to continued hypoxia and inability to wean O, started Lasix again 1/12 to see if this helps his hypoxia - continue incentive spirometry  - stopped smoking 1 yr ago but smoked for at least 17 yrs on and off - ? Underlying COPD/ emphysema - weaned down to 3 L today  Active Problems: Chronic pedal edema - per patient he has had pedal edema for > 1 yr - likely venous stasis- no mention of LV or RV failure on ECHO - venous duplex negative for DVT  Chronic hypercarbic resp failure - CO2 in  70s with normal pH - likely has underlying COPD and possibly sleep apnea and OHS  Thrombocytopenia - mild - appears chronic-    CAD (coronary artery disease) - cont ASA and statin    Fall - PT eval- currently can't walk due to pain in left leg (laceration)- PT eval  Chronic pain - has Morphine pump and on Neurontin and Oxycodone due to back pain from Thoracotomy   DVT prophylaxis: Heparin Code Status: Full code Family Communication: none Disposition Plan:  Cont to follow  Consultants:    Procedures:    Antimicrobials:  Anti-infectives    Start     Dose/Rate Route Frequency Ordered Stop   08/17/16 1700  azithromycin (ZITHROMAX) tablet 250 mg     250 mg Oral Every 24 hours 08/16/16 1638 08/21/16 1659   08/16/16 1700  cefTRIAXone (ROCEPHIN) 1 g in dextrose 5 % 50 mL IVPB     1 g 100 mL/hr over 30 Minutes Intravenous Every 24 hours 08/16/16 1638     08/16/16 1700  azithromycin (ZITHROMAX) tablet 500 mg     500 mg Oral Daily 08/16/16 1638 08/16/16 1813       Objective: Vitals:   08/18/16 0540 08/18/16 0806 08/18/16 1008 08/18/16 1032  BP: (!) 110/52     Pulse: 67     Resp: 20     Temp: 98.1 F (36.7 C)     TempSrc: Oral  SpO2: 96% 93% (!) 82% 94%  Weight: (!) 138.1 kg (304 lb 8 oz)     Height:        Intake/Output Summary (Last 24 hours) at 08/18/16 1232 Last data filed at 08/18/16 1030  Gross per 24 hour  Intake             1410 ml  Output             3500 ml  Net            -2090 ml   Filed Weights   08/16/16 0628 08/17/16 0555 08/18/16 0540  Weight: (!) 140.9 kg (310 lb 11.2 oz) (!) 140 kg (308 lb 9.6 oz) (!) 138.1 kg (304 lb 8 oz)    Examination: General exam: Appears comfortable  HEENT: PERRLA, oral mucosa moist, no sclera icterus or thrush Respiratory system: mild crackles in LLL today-  Respiratory effort normal. Cardiovascular system: S1 & S2 heard, RRR.  No murmurs  Gastrointestinal system: Abdomen soft, non-tender, nondistended. Normal  bowel sound. No organomegaly Central nervous system: Alert and oriented. No focal neurological deficits. Extremities: No cyanosis, clubbing- 1+ pedal edema Skin: No rashes or ulcers Psychiatry:  Mood & affect appropriate.     Data Reviewed: I have personally reviewed following labs and imaging studies  CBC:  Recent Labs Lab 08/14/16 1604 08/15/16 0805 08/16/16 0347  WBC 6.5 5.9 6.1  NEUTROABS 4.1  --   --   HGB 12.4* 12.8* 12.3*  HCT 42.6 44.7 42.6  MCV 95.3 97.4 97.3  PLT 143* 127* 123456*   Basic Metabolic Panel:  Recent Labs Lab 08/14/16 1530 08/15/16 0205 08/16/16 0347 08/17/16 0854 08/18/16 0520  NA 138 140 139 138 139  K 5.2* 3.7 4.2 3.9 4.0  CL 94* 93* 92* 91* 91*  CO2 36* 41* 36* 37* 38*  GLUCOSE 106* 137* 106* 137* 115*  BUN 8 7 9  5* 9  CREATININE 0.95 0.89 1.01 0.90 0.93  CALCIUM 8.2* 8.3* 8.1* 8.8* 8.8*   GFR: Estimated Creatinine Clearance: 115.6 mL/min (by C-G formula based on SCr of 0.93 mg/dL). Liver Function Tests:  Recent Labs Lab 08/14/16 1530  AST 57*  ALT 20  ALKPHOS 54  BILITOT 0.9  PROT 6.9  ALBUMIN 3.0*   No results for input(s): LIPASE, AMYLASE in the last 168 hours. No results for input(s): AMMONIA in the last 168 hours. Coagulation Profile: No results for input(s): INR, PROTIME in the last 168 hours. Cardiac Enzymes:  Recent Labs Lab 08/14/16 2153 08/15/16 0205 08/15/16 0805  TROPONINI <0.03 <0.03 <0.03   BNP (last 3 results) No results for input(s): PROBNP in the last 8760 hours. HbA1C: No results for input(s): HGBA1C in the last 72 hours. CBG: No results for input(s): GLUCAP in the last 168 hours. Lipid Profile: No results for input(s): CHOL, HDL, LDLCALC, TRIG, CHOLHDL, LDLDIRECT in the last 72 hours. Thyroid Function Tests: No results for input(s): TSH, T4TOTAL, FREET4, T3FREE, THYROIDAB in the last 72 hours. Anemia Panel: No results for input(s): VITAMINB12, FOLATE, FERRITIN, TIBC, IRON, RETICCTPCT in the last  72 hours. Urine analysis:    Component Value Date/Time   COLORURINE YELLOW 06/10/2015 1106   APPEARANCEUR TURBID (A) 06/10/2015 1106   LABSPEC 1.011 06/10/2015 1106   PHURINE 7.0 06/10/2015 1106   GLUCOSEU NEGATIVE 06/10/2015 1106   HGBUR LARGE (A) 06/10/2015 1106   BILIRUBINUR NEGATIVE 06/10/2015 1106   KETONESUR NEGATIVE 06/10/2015 1106   PROTEINUR 30 (A) 06/10/2015 1106   UROBILINOGEN  1.0 06/10/2015 1106   NITRITE POSITIVE (A) 06/10/2015 1106   LEUKOCYTESUR LARGE (A) 06/10/2015 1106   Sepsis Labs: @LABRCNTIP (procalcitonin:4,lacticidven:4) )No results found for this or any previous visit (from the past 240 hour(s)).   Radiology Studies: Dg Chest 1 View  Result Date: 08/16/2016 CLINICAL DATA:  Shortness of breath. EXAM: CHEST 1 VIEW COMPARISON:  08/15/2015. FINDINGS: Mediastinum and hilar structures normal. Cardiomegaly with bilateral from interstitial prominence and pleural effusions consistent congestive heart failure. Pneumonitis cannot be excluded. A component of underlying chronic interstitial lung disease most likely present. No pneumothorax. IMPRESSION: Congestive heart failure bilateral from interstitial edema and bilateral pleural effusions. Findings are new from prior exam. A component of underlying chronic interstitial lung disease most likely present. Electronically Signed   By: Marcello Moores  Register   On: 08/16/2016 16:18   Nm Pulmonary Perf And Vent  Result Date: 08/16/2016 CLINICAL DATA:  Shortness of breath. EXAM: NUCLEAR MEDICINE VENTILATION - PERFUSION LUNG SCAN TECHNIQUE: Ventilation images were obtained in multiple projections using inhaled aerosol Tc-46m DTPA. Perfusion images were obtained in multiple projections after intravenous injection of Tc-58m MAA. RADIOPHARMACEUTICALS:  31.0 MCi Technetium-87m DTPA aerosol inhalation and 4.2 mCi Technetium-10m MAA IV COMPARISON:  Chest x-ray 08/14/2016. FINDINGS: Ventilation: Patchy ventilatory defects are noted bilaterally.  Perfusion: No significant perfusion mismatches are noted. IMPRESSION: Low probability pulmonary embolus. Electronically Signed   By: [place]   On: 08/16/2016 16:00      Scheduled Meds: . aspirin EC  81 mg Oral Daily  . azithromycin  250 mg Oral Q24H  . bisacodyl  10 mg Oral BID  . cefTRIAXone (ROCEPHIN)  IV  1 g Intravenous Q24H  . dextromethorphan-guaiFENesin  1 tablet Oral BID  . doxazosin  1 mg Oral QPM  . enoxaparin (LOVENOX) injection  40 mg Subcutaneous Q24H  . ferrous sulfate  325 mg Oral Q breakfast  . furosemide  40 mg Intravenous 3 times per day  . gabapentin  300 mg Oral TID  . mometasone-formoterol  2 puff Inhalation BID  . multivitamin with minerals   Oral Daily  . pantoprazole  40 mg Oral BID  . potassium chloride  20 mEq Oral BID  . pravastatin  20 mg Oral QHS  . senna-docusate  2 tablet Oral BID  . traZODone  225 mg Oral QHS  . vitamin C  500 mg Oral Daily  . zinc sulfate  220 mg Oral Daily   Continuous Infusions:    LOS: 4 days    Time spent in minutes: [place], MD [facility] Pager: [email or web address] Password [facility] 08/18/2016, 12:32 PM

## 2016-08-18 NOTE — Progress Notes (Signed)
Per nurse report, dressing changed to left lower leg at 0700.  Dressing C/D/I.  Will continue to monitor. A.Maleeha Halls, RN

## 2016-08-18 NOTE — Progress Notes (Signed)
Patient ambulated 36 ft. On 3L oxygen. Patient currently in chair.  Will continue to monitor. A.Lyrick Worland, RN

## 2016-08-18 NOTE — Progress Notes (Addendum)
Patient desat to 82% on 2L.  RN increased O2 to 3L [place].  Patient current O2 status is 94%. Jerry [name]. RN

## 2016-08-19 DIAGNOSIS — W19XXXD Unspecified fall, subsequent encounter: Secondary | ICD-10-CM

## 2016-08-19 LAB — BASIC METABOLIC PANEL
ANION GAP: 11 (ref 5–15)
BUN: 11 mg/dL (ref 6–20)
CHLORIDE: 89 mmol/L — AB (ref 101–111)
CO2: 36 mmol/L — ABNORMAL HIGH (ref 22–32)
Calcium: 9 mg/dL (ref 8.9–10.3)
Creatinine, Ser: 0.87 mg/dL (ref 0.61–1.24)
GFR calc Af Amer: >60 mL/min (ref >60)
GLUCOSE: 101 mg/dL — AB (ref 65–99)
POTASSIUM: 4 mmol/L (ref 3.5–5.1)
Sodium: 136 mmol/L (ref 135–145)

## 2016-08-19 LAB — CBC
HEMATOCRIT: 41.2 % (ref 39.0–52.0)
HEMOGLOBIN: 12.3 g/dL — AB (ref 13.0–17.0)
MCH: 27.7 pg (ref 26.0–34.0)
MCHC: 29.9 g/dL — AB (ref 30.0–36.0)
MCV: 92.8 fL (ref 78.0–100.0)
Platelets: 153 10*3/uL (ref 150–400)
RBC: 4.44 MIL/uL (ref 4.22–5.81)
RDW: 16.1 % — ABNORMAL HIGH (ref 11.5–15.5)
WBC: 6.6 10*3/uL (ref 4.0–10.5)

## 2016-08-19 MED ORDER — DM-GUAIFENESIN ER 30-600 MG PO TB12: 1.0000 | ORAL_TABLET | Freq: Two times a day (BID) | ORAL | Status: DC | PRN

## 2016-08-19 NOTE — Progress Notes (Signed)
SATURATION QUALIFICATIONS: (This note is used to comply with regulatory documentation for home oxygen)  Patient Saturations on Room Air at Rest 96%  Patient Saturations on Room Air while Ambulating = 83%  Patient Saturations on 2 Liters of oxygen while Ambulating = 96%  Please briefly explain why patient needs home oxygen: Patient saturations is dropping to as low as  83-88% while ambulating without oxygen.

## 2016-08-19 NOTE — Progress Notes (Signed)
PROGRESS NOTE    Jerry [name]  [id number] DOB: 01-Apr-1951 DOA: 08/14/2016  PCP: Tula Nakayama   Brief Narrative:  Jerry [name] is a 67 y.o. male with medical history significant of hypertension, hyperlipidemia, GERD, anxiety, tobacco abuse, CAD, chronic pain syndrome, osteosarcoma of rib (s/p of thoracotomy), dCHF, who presents with fall. Per his daughter, pt had an unwitnessed fall while he was walking, landed on the left side, causing a large L shaped laceration on his left leg. Pt reports he thinks he tripped on the carpet. In ED, pt was noted to have O2 Sat 79% which increased to 96% on 4L. Noted to have severe b/l lower extermity edema and a mild cough.    Subjective: Cough nearly resolved. Ankle edema resolved.   Assessment & Plan:   Principal Problem:   Acute respiratory failure with hypoxia - requiring 6 L O2 on admission  - no flu like symptoms --  D Dimer 2.08-  Heparin started empirically in ER- unable to lay flat for CT chest due to severe back issues -Venous duplex LE ordered and negative- VQ negative for PE but showing patchy ventilatory defects bilaterally- -  stopped Heparin 1/11 - 1/11- started antibiotics to cover for pneumonia due to cough and yellow sputum and patchy infiltrates on VQ- allergic to PCN (rash) and Cipro (anaphaylaxis)- started Rocephin and Zithro - CXR  Read suggesting CHF- However, 2 D ECHO shows only grade 1 diastolic dysfunction-   - due to continued hypoxia and inability to wean O, started Lasix 1/12 to see if this helps his hypoxia- 8.5 L neg balance- hypoxia improving - continue incentive spirometry  - stopped smoking 1 yr ago but smoked for at least 17 yrs on and off - ? Underlying COPD/ emphysema - weaning O2- down to 1-2 L today- will check ambulatory pulse ox today  Active Problems: Chronic pedal edema - per patient he has had pedal edema for > 1 yr - likely venous stasis- no mention of LV or RV failure on ECHO - venous  duplex negative for DVT - edema has resolved with diuretics  U retention - d/c foley today for voiding trial.  - cont Doxazosin   Chronic hypercarbic resp failure - CO2 in 70s with normal pH - likely has underlying COPD and possibly sleep apnea and OHS  Thrombocytopenia - mild - appears chronic-    CAD (coronary artery disease) - cont ASA and statin    Fall- laceration L leg - PT eval >> HHPT - continue dressings to leg  Chronic pain - has Morphine pump and on Neurontin and Oxycodone due to back pain from Thoracotomy   Morbid obesity Body mass index is 39.9 kg/m.  DVT prophylaxis: Heparin Code Status: Full code Family Communication: none Disposition Plan:  Cont to follow  Consultants:    Procedures:    Antimicrobials:  Anti-infectives    Start     Dose/Rate Route Frequency Ordered Stop   08/17/16 1700  azithromycin (ZITHROMAX) tablet 250 mg     250 mg Oral Every 24 hours 08/16/16 1638 08/21/16 1659   08/16/16 1700  cefTRIAXone (ROCEPHIN) 1 g in dextrose 5 % 50 mL IVPB     1 g 100 mL/hr over 30 Minutes Intravenous Every 24 hours 08/16/16 1638     08/16/16 1700  azithromycin (ZITHROMAX) tablet 500 mg     500 mg Oral Daily 08/16/16 1638 08/16/16 1813       Objective: Vitals:   08/18/16 1944 08/18/16  2129 08/19/16 0528 08/19/16 0910  BP:  (!) 122/52 111/62   Pulse:  67 (!) 57   Resp:  18 17   Temp:  98.6 F (37 C) 97.8 F (36.6 C)   TempSrc:  Oral Oral   SpO2: 97% 93% 96% 95%  Weight:   (!) 137.2 kg (302 lb 6.4 oz)   Height:        Intake/Output Summary (Last 24 hours) at 08/19/16 1054 Last data filed at 08/19/16 0900  Gross per 24 hour  Intake              960 ml  Output             3125 ml  Net            -2165 ml   Filed Weights   08/17/16 0555 08/18/16 0540 08/19/16 0528  Weight: (!) 140 kg (308 lb 9.6 oz) (!) 138.1 kg (304 lb 8 oz) (!) 137.2 kg (302 lb 6.4 oz)    Examination: General exam: Appears comfortable  HEENT: PERRLA, oral  mucosa moist, no sclera icterus or thrush Respiratory system: CTA b/l-   Respiratory effort normal. Cardiovascular system: S1 & S2 heard, RRR.  No murmurs  Gastrointestinal system: Abdomen soft, non-tender, nondistended. Normal bowel sound. No organomegaly Central nervous system: Alert and oriented. No focal neurological deficits. Extremities: No cyanosis, clubbing- no pedal edema Skin: No rashes or ulcers Psychiatry:  Mood & affect appropriate.     Data Reviewed: I have personally reviewed following labs and imaging studies  CBC:  Recent Labs Lab 08/14/16 1604 08/15/16 0805 08/16/16 0347 08/19/16 0413  WBC 6.5 5.9 6.1 6.6  NEUTROABS 4.1  --   --   --   HGB 12.4* 12.8* 12.3* 12.3*  HCT 42.6 44.7 42.6 41.2  MCV 95.3 97.4 97.3 92.8  PLT 143* 127* 149* 0000000   Basic Metabolic Panel:  Recent Labs Lab 08/15/16 0205 08/16/16 0347 08/17/16 0854 08/18/16 0520 08/19/16 0413  NA 140 139 138 139 136  K 3.7 4.2 3.9 4.0 4.0  CL 93* 92* 91* 91* 89*  CO2 41* 36* 37* 38* 36*  GLUCOSE 137* 106* 137* 115* 101*  BUN 7 9 5* 9 11  CREATININE 0.89 1.01 0.90 0.93 0.87  CALCIUM 8.3* 8.1* 8.8* 8.8* 9.0   GFR: Estimated Creatinine Clearance: 123.1 mL/min (by C-G formula based on SCr of 0.87 mg/dL). Liver Function Tests:  Recent Labs Lab 08/14/16 1530  AST 57*  ALT 20  ALKPHOS 54  BILITOT 0.9  PROT 6.9  ALBUMIN 3.0*   No results for input(s): LIPASE, AMYLASE in the last 168 hours. No results for input(s): AMMONIA in the last 168 hours. Coagulation Profile: No results for input(s): INR, PROTIME in the last 168 hours. Cardiac Enzymes:  Recent Labs Lab 08/14/16 2153 08/15/16 0205 08/15/16 0805  TROPONINI <0.03 <0.03 <0.03   BNP (last 3 results) No results for input(s): PROBNP in the last 8760 hours. HbA1C: No results for input(s): HGBA1C in the last 72 hours. CBG: No results for input(s): GLUCAP in the last 168 hours. Lipid Profile: No results for input(s): CHOL, HDL,  LDLCALC, TRIG, CHOLHDL, LDLDIRECT in the last 72 hours. Thyroid Function Tests: No results for input(s): TSH, T4TOTAL, FREET4, T3FREE, THYROIDAB in the last 72 hours. Anemia Panel: No results for input(s): VITAMINB12, FOLATE, FERRITIN, TIBC, IRON, RETICCTPCT in the last 72 hours. Urine analysis:    Component Value Date/Time   COLORURINE YELLOW 06/10/2015 1106  APPEARANCEUR TURBID (A) 06/10/2015 1106   LABSPEC 1.011 06/10/2015 1106   PHURINE 7.0 06/10/2015 1106   GLUCOSEU NEGATIVE 06/10/2015 1106   HGBUR LARGE (A) 06/10/2015 1106   BILIRUBINUR NEGATIVE 06/10/2015 1106   KETONESUR NEGATIVE 06/10/2015 1106   PROTEINUR 30 (A) 06/10/2015 1106   UROBILINOGEN 1.0 06/10/2015 1106   NITRITE POSITIVE (A) 06/10/2015 1106   LEUKOCYTESUR LARGE (A) 06/10/2015 1106   Sepsis Labs: @LABRCNTIP (procalcitonin:4,lacticidven:4) )No results found for this or any previous visit (from the past 240 hour(s)).   Radiology Studies: No results found.    Scheduled Meds: . aspirin EC  81 mg Oral Daily  . azithromycin  250 mg Oral Q24H  . bisacodyl  10 mg Oral BID  . cefTRIAXone (ROCEPHIN)  IV  1 g Intravenous Q24H  . dextromethorphan-guaiFENesin  1 tablet Oral BID  . doxazosin  1 mg Oral QPM  . enoxaparin (LOVENOX) injection  40 mg Subcutaneous Q24H  . ferrous sulfate  325 mg Oral Q breakfast  . furosemide  40 mg Intravenous Daily  . gabapentin  300 mg Oral TID  . mometasone-formoterol  2 puff Inhalation BID  . multivitamin with minerals   Oral Daily  . pantoprazole  40 mg Oral BID  . potassium chloride  20 mEq Oral BID  . pravastatin  20 mg Oral QHS  . senna-docusate  2 tablet Oral BID  . traZODone  225 mg Oral QHS  . vitamin C  500 mg Oral Daily  . zinc sulfate  220 mg Oral Daily   Continuous Infusions:    LOS: 5 days    Time spent in minutes: [place], MD [facility] Pager: [email or web address] Password [facility] 08/19/2016, 10:54 AM

## 2016-08-20 LAB — CBC
HCT: 44.6 % (ref 39.0–52.0)
HEMOGLOBIN: 13.3 g/dL (ref 13.0–17.0)
MCH: 27.4 pg (ref 26.0–34.0)
MCHC: 29.8 g/dL — ABNORMAL LOW (ref 30.0–36.0)
MCV: 91.8 fL (ref 78.0–100.0)
Platelets: 156 10*3/uL (ref 150–400)
RBC: 4.86 MIL/uL (ref 4.22–5.81)
RDW: 16.1 % — ABNORMAL HIGH (ref 11.5–15.5)
WBC: 6.1 10*3/uL (ref 4.0–10.5)

## 2016-08-20 LAB — BASIC METABOLIC PANEL
ANION GAP: 11 (ref 5–15)
BUN: 13 mg/dL (ref 6–20)
CO2: 32 mmol/L (ref 22–32)
Calcium: 9.5 mg/dL (ref 8.9–10.3)
Chloride: 92 mmol/L — ABNORMAL LOW (ref 101–111)
Creatinine, Ser: 0.83 mg/dL (ref 0.61–1.24)
Glucose, Bld: 108 mg/dL — ABNORMAL HIGH (ref 65–99)
Potassium: 4 mmol/L (ref 3.5–5.1)
Sodium: 135 mmol/L (ref 135–145)

## 2016-08-20 MED ORDER — AZITHROMYCIN 250 MG PO TABS: 250.0000 mg | ORAL_TABLET | Freq: Every day | ORAL | 0 refills | Status: AC

## 2016-08-20 MED ORDER — BUDESONIDE-FORMOTEROL FUMARATE 160-4.5 MCG/ACT IN AERO: 2.0000 | INHALATION_SPRAY | Freq: Every day | RESPIRATORY_TRACT | 12 refills | Status: DC

## 2016-08-20 NOTE — Progress Notes (Signed)
Occupational Therapy Treatment Patient Details Name: Jerry [name] MRN: [id number] DOB: 11/23/1950 Today's Date: 08/20/2016    History of present illness 66 y.o.malewho presents following a fall at home with laceration to Lt LE. Pt now in acute respiratory failure with hypoxia. PMH: hypertension, hyperlipidemia, GERD, anxiety, tobacco abuse, CAD, chronic pain syndrome, osteosarcoma of rib(s/p of thoracotomy), dCHF,  Rt TKA, back surgery.    OT comments  Pt making good progress although limited by endurance. Desat with walking with nsg to 81 on RA prior to session. Completed education regarding energy conservation and reducing risk of falls.Feel pt will benefit form HHOT to facilitate return to PLOF.   Follow Up Recommendations  Supervision/Assistance - 24 hour;Home health OT    Equipment Recommendations  Tub/shower seat (son to get)    Recommendations for Other Services      Precautions / Restrictions Precautions Precautions: Fall       Mobility Bed Mobility               General bed mobility comments: OOB in chair. States he does not sleep in his bed at home. Sleeps in a recliner  Transfers Overall transfer level: Needs assistance Equipment used: Rolling walker (2 wheeled) Transfers: Sit to/from Stand Sit to Stand: Supervision              Balance             Standing balance-Leahy Scale: Fair                     ADL                                       Functional mobility during ADLs: Supervision/safety;Rolling walker General ADL Comments: Completed educaiton on compensatory techniques and use of energy conservation for ADL. PT given handout on energy conservaiton and reducing risk of falls at home. Recommended pt use hoserchair for bathing. Son present and he plans to Estate agent. Pt verbalized understanding of information.       Vision                     Perception     Praxis      Cognition    Behavior During Therapy: WFL for tasks assessed/performed Overall Cognitive Status: Within Functional Limits for tasks assessed                       Extremity/Trunk Assessment   generalized weakness            Exercises     Shoulder Instructions       General Comments      Pertinent Vitals/ Pain       Pain Assessment: 0-10 Pain Location: back, Lt LE Pain Descriptors / Indicators: Aching Pain Intervention(s): Limited activity within patient's tolerance  Home Living                                          Prior Functioning/Environment              Frequency  Min 2X/week        Progress Toward Goals  OT Goals(current goals can now be found in the care plan section)  Progress towards OT goals: Progressing toward goals  Acute Rehab OT Goals Patient Stated Goal: to get better OT Goal Formulation: With patient Time For Goal Achievement: 08/30/16 Potential to Achieve Goals: Fair ADL Goals Pt Will Perform Grooming: with modified independence;sitting Pt Will Perform Lower Body Bathing: with set-up;with modified independence;with adaptive equipment;sitting/lateral leans;sit to/from stand Pt Will Perform Lower Body Dressing: with modified independence;with set-up;with adaptive equipment;sitting/lateral leans;sit to/from stand Pt Will Transfer to Toilet: with modified independence;ambulating;stand pivot transfer;bedside commode Pt Will Perform Toileting - Clothing Manipulation and hygiene: with modified independence;with adaptive equipment;sitting/lateral leans;sit to/from stand Pt Will Perform Tub/Shower Transfer: with supervision;tub bench;rolling walker;grab bars;with caregiver independent in assisting  Plan Discharge plan needs to be updated    Co-evaluation                 End of Session Equipment Utilized During Treatment: Rolling walker   Activity Tolerance Patient tolerated treatment well   Patient Left in chair;with  call bell/phone within reach;with family/visitor present   Nurse Communication Mobility status        Time: [id number] OT Time Calculation (min): 18 min  Charges: OT General Charges $OT Visit: 1 Procedure OT Treatments $Self Care/Home Management : 8-22 mins  Davaun Quintela,HILLARY 08/20/2016, 10:13 AM   Maurie Boettcher, OT/L  [phone] 08/20/2016

## 2016-08-20 NOTE — Plan of Care (Signed)
Problem: Pain Managment: Goal: General experience of comfort will improve Outcome: Adequate for Discharge Patient has chronic pain issues managed by home meds

## 2016-08-20 NOTE — Progress Notes (Signed)
Discharge instructions reviewed with patient and son, went through all of patients medications, what he had received today, what was still due today and he and son expressed understanding of this.  Also demonstrated wound care to left leg which consists of Xeroform gauze and kerlix wrap daily.  Son said he felt okay with performing this daily starting tomorrow.  Patient taken to front of hospital by tech to be transported home by son.

## 2016-08-20 NOTE — Progress Notes (Signed)
SATURATION QUALIFICATIONS: (This note is used to comply with regulatory documentation for home oxygen)  Patient Saturations on Room Air at Rest = 95%  Patient Saturations on Room Air while Ambulating = 81%  Patient Saturations on 2 Liters of oxygen while Ambulating 91%   Please briefly explain why patient needs home oxygen: Patient oxygen level dropped down to 81% with ambulation

## 2016-08-20 NOTE — Discharge Summary (Signed)
Physician Discharge Summary  Jerry [name] [id number] DOB: 12-05-1950 DOA: 08/14/2016  PCP: Tula Nakayama  Admit date: 08/14/2016 Discharge date: 08/20/2016  Admitted From: home Disposition:  home   Recommendations for Outpatient Follow-up:  1. Pulmonary referral for sleep study and PFTs 2. Encourage weight loss 3. Follow closely for fluid retention  4. L leg staples need to be removed in 1-2 wks  Home Health:  ordered Equipment/Devices:      Discharge Condition:  stable   CODE STATUS:  Full code   Diet recommendation:  Low sodium, fat and carbs Consultations:  none    Discharge Diagnoses:  Principal Problem:   Acute respiratory failure with hypoxia ([place]) Active Problems:   CAD (coronary artery disease)   Essential hypertension   HLD (hyperlipidemia)   Fall   GERD (gastroesophageal reflux disease)   Anxiety   Hyperkalemia   Acute on chronic diastolic CHF (congestive heart failure) (HCC)    Subjective: No complaints today. No dyspnea.   Brief Summary: Principal Problem:   Acute respiratory failure with hypoxia due to acute dCHF and CAP - requiring 6 L O2 on admission  - no respiratory symptoms on admission but a couple of days later developed a cough with yellow sputum - CXR was clear --  D Dimer 2.08-  Heparin started empirically in ER to treat for PE-  unable to lay flat for CT chest due to severe back issues -Venous duplex LE ordered and negative- VQ negative for PE but showing patchy ventilatory defects bilaterally- -  stopped Heparin 1/11 and started antibiotics to cover for pneumonia due to cough and yellow sputum and patchy infiltrates on VQ- allergic to PCN (rash) and Cipro (anaphaylaxis)- started Rocephin and Zithro - CXR read suggesting CHF-   2 D ECHO shows only grade 1 diastolic dysfunction-   - due to continued hypoxia and inability to wean O, started Lasix 1/12 to see if this helps his hypoxia-  Now 10 L neg balance and has lost about 15 lbs  of fluid weight-  hypoxia improving  - stopped smoking 1 yr ago but smoked for at least 17 yrs on and off - ? Underlying COPD/ emphysema -  ambulatory pulse ox done- needs 2 L O2 to go home home with  Active Problems: Chronic pedal edema - per patient he has had pedal edema for > 1 yr - venous duplex negative for DVT - edema has resolved with diuretics  U retention - noted on day 2 of admission to be having difficulty voiding- foley placed - d/c'd foley  1/14  for voiding trial- no further issues with voiding  - cont Doxazosin   Chronic hypercarbic resp failure - CO2 in 70s with normal pH - likely has underlying COPD and possibly sleep apnea and OHS/ restrictive lung disease due to weight - needs PFTs and sleep study  Thrombocytopenia - mild - has resolved    CAD (coronary artery disease) - cont ASA and statin    Fall- laceration L leg - PT eval >> HHPT - continue dressings to leg - staples need to be removed in 1-2 wks  Chronic pain - has Morphine pump and on Neurontin and Oxycodone due to back pain from Thoracotomy   Morbid obesity Body mass index is 39.9 kg/m - discussed weight loss strategies    Discharge Instructions  Discharge Instructions    (HEART FAILURE PATIENTS) Call MD:  Anytime you have any of the following symptoms: 1) 3 pound weight gain in  24 hours or 5 pounds in 1 week 2) shortness of breath, with or without a dry hacking cough 3) swelling in the hands, feet or stomach 4) if you have to sleep on extra pillows at night in order to breathe.    Complete by:  As directed    Diet - low sodium heart healthy    Complete by:  As directed    Diet must be low in salt, fat and low in sugar   Increase activity slowly    Complete by:  As directed      Allergies as of 08/20/2016      Reactions   Ciprofloxacin Anaphylaxis   Penicillins Rash   Has patient had a PCN reaction causing immediate rash, facial/tongue/throat swelling, SOB or lightheadedness  with hypotension:unsure Has patient had a PCN reaction causing severe rash involving mucus membranes or skin necrosis:unsure Has patient had a PCN reaction that required hospitalization:unsure Has patient had a PCN reaction occurring within the last 10 years:NO If all of the above answers are "NO", then may proceed with Cephalosporin use.   Tramadol Hcl Rash      Medication List    STOP taking these medications   potassium chloride 10 MEQ tablet Commonly known as:  K-DUR     TAKE these medications   aspirin EC 81 MG tablet Take 81 mg by mouth daily.   azithromycin 250 MG tablet Commonly known as:  ZITHROMAX Take 1 tablet (250 mg total) by mouth daily.   bisacodyl 5 MG EC tablet Commonly known as:  DULCOLAX Take 10 mg by mouth 2 (two) times daily.   budesonide-formoterol 160-4.5 MCG/ACT inhaler Commonly known as:  SYMBICORT Inhale 2 puffs into the lungs daily.   calcium carbonate 500 MG chewable tablet Commonly known as:  TUMS - dosed in mg elemental calcium Chew 1-2 tablets by mouth 3 (three) times daily as needed for indigestion or heartburn.   CENTRUM SILVER PO Take 1 tablet by mouth daily.   doxazosin 1 MG tablet Commonly known as:  CARDURA Take 1 mg by mouth every evening.   furosemide 20 MG tablet Commonly known as:  LASIX Take 20-40 mg by mouth 2 (two) times daily. 20 mg in the am and 10 mg in the pm   gabapentin 100 MG capsule Commonly known as:  NEURONTIN Take 300 mg by mouth 3 (three) times daily.   Iron Tabs Take 1 tablet by mouth daily.   KLOR-CON M10 10 MEQ tablet Generic drug:  potassium chloride Take 20 mEq by mouth daily.   lovastatin 20 MG tablet Commonly known as:  MEVACOR Take 1 tablet (20 mg total) by mouth at bedtime.   nitroGLYCERIN 0.4 MG SL tablet Commonly known as:  NITROSTAT Place 0.4 mg under the tongue every 5 (five) minutes as needed for chest pain.   oxycodone 30 MG immediate release tablet Commonly known as:   ROXICODONE Take 30 mg by mouth every 6 (six) hours as needed. For pain.   pantoprazole 40 MG tablet Commonly known as:  PROTONIX Take 1 tablet (40 mg total) by mouth 2 (two) times daily.   promethazine 25 MG tablet Commonly known as:  PHENERGAN Take 25 mg by mouth daily. For nausea.   senna-docusate 8.6-50 MG tablet Commonly known as:  Senokot-S Take 2 tablets by mouth 2 (two) times daily.   traZODone 150 MG tablet Commonly known as:  DESYREL Take 225 mg by mouth at bedtime.   vitamin C 500 MG tablet  Commonly known as:  ASCORBIC ACID Take 500 mg by mouth daily.   zinc gluconate 50 MG tablet Take 50 mg by mouth daily.      Follow-up Information    KAPLAN,KRISTEN, PA-C Follow up.   Specialty:  Family Medicine Why:    Contact information: [address] [place] [address] [phone]          Allergies  Allergen Reactions  . Ciprofloxacin Anaphylaxis  . Penicillins Rash    Has patient had a PCN reaction causing immediate rash, facial/tongue/throat swelling, SOB or lightheadedness with hypotension:unsure Has patient had a PCN reaction causing severe rash involving mucus membranes or skin necrosis:unsure Has patient had a PCN reaction that required hospitalization:unsure Has patient had a PCN reaction occurring within the last 10 years:NO If all of the above answers are "NO", then may proceed with Cephalosporin use.   . Tramadol Hcl Rash     Procedures/Studies: 2 D ECHO  Study Conclusions  - Left ventricle: The cavity size was normal. There was mild focal   basal hypertrophy of the septum. Systolic function was normal.   The estimated ejection fraction was in the range of 55% to 60%.   Wall motion was normal; there were no regional wall motion   abnormalities. Doppler parameters are consistent with abnormal   left ventricular relaxation (grade 1 diastolic dysfunction).  Bilateral lower extremity venous duplex  -Preliminary findings: No evidence  of deep vein thrombosis in the visualized veins of the lower extremities.  Negative for baker's cysts bilaterally.   Dg Chest 1 View  Result Date: 08/16/2016 CLINICAL DATA:  Shortness of breath. EXAM: CHEST 1 VIEW COMPARISON:  08/15/2015. FINDINGS: Mediastinum and hilar structures normal. Cardiomegaly with bilateral from interstitial prominence and pleural effusions consistent congestive heart failure. Pneumonitis cannot be excluded. A component of underlying chronic interstitial lung disease most likely present. No pneumothorax. IMPRESSION: Congestive heart failure bilateral from interstitial edema and bilateral pleural effusions. Findings are new from prior exam. A component of underlying chronic interstitial lung disease most likely present. Electronically Signed   By: Marcello Moores  Register   On: 08/16/2016 16:18   Dg Chest 2 View  Result Date: 08/14/2016 CLINICAL DATA:  Trip and fall while walking dog, initial encounter EXAM: CHEST  2 VIEW COMPARISON:  11/29/2015, 05/22/2016 FINDINGS: Cardiac shadow is enlarged. Bibasilar atelectatic changes are again seen. Blunting of right costophrenic angle is noted of a chronic nature. No pneumothorax is seen. No acute bony abnormality is noted. IMPRESSION: Chronic changes without acute abnormality. Electronically Signed   By: Inez Catalina M.D.   On: 08/14/2016 15:10   Dg Thoracic Spine 2 View  Result Date: 08/14/2016 CLINICAL DATA:  Recent fall while walking dog with back pain, initial encounter EXAM: THORACIC SPINE 2 VIEWS COMPARISON:  None. FINDINGS: No compression deformities are identified. Degenerative changes of the thoracic spine are seen. No paraspinal mass lesion is noted. IMPRESSION: Degenerative change without acute abnormality. Electronically Signed   By: Inez Catalina M.D.   On: 08/14/2016 15:17   Dg Tibia/fibula Left  Result Date: 08/14/2016 CLINICAL DATA:  Fall wall walking dog with left leg pain, initial encounter EXAM: LEFT TIBIA AND FIBULA - 2  VIEW COMPARISON:  None. FINDINGS: Soft tissue changes are noted in the mid calf anteriorly consistent with the recent injury. Degenerative changes about the knee joint and ankle joint are seen. No acute fracture or dislocation is noted. IMPRESSION: Degenerative changes without acute bony abnormality. Electronically Signed   By: Elta Guadeloupe  Lukens M.D.   On: 08/14/2016 15:11   Nm Pulmonary Perf And Vent  Result Date: 08/16/2016 CLINICAL DATA:  Shortness of breath. EXAM: NUCLEAR MEDICINE VENTILATION - PERFUSION LUNG SCAN TECHNIQUE: Ventilation images were obtained in multiple projections using inhaled aerosol Tc-2m DTPA. Perfusion images were obtained in multiple projections after intravenous injection of Tc-86m MAA. RADIOPHARMACEUTICALS:  31.0 MCi Technetium-58m DTPA aerosol inhalation and 4.2 mCi Technetium-80m MAA IV COMPARISON:  Chest x-ray 08/14/2016. FINDINGS: Ventilation: Patchy ventilatory defects are noted bilaterally. Perfusion: No significant perfusion mismatches are noted. IMPRESSION: Low probability pulmonary embolus. Electronically Signed   By: Marcello Moores  Register   On: 08/16/2016 16:00       Discharge Exam: Vitals:   08/19/16 2215 08/20/16 0526  BP: (!) 120/58 (!) 117/58  Pulse: 66 61  Resp: 18 20  Temp: 98.5 F (36.9 C) 98.7 F (37.1 C)   Vitals:   08/19/16 2215 08/20/16 0526 08/20/16 0749 08/20/16 0813  BP: (!) 120/58 (!) 117/58    Pulse: 66 61    Resp: 18 20    Temp: 98.5 F (36.9 C) 98.7 F (37.1 C)    TempSrc: Oral Oral    SpO2: 91% 96% 98% 95%  Weight:  (!) 137.8 kg (303 lb 14.4 oz)    Height:        General: Pt is alert, awake, not in acute distress Cardiovascular: RRR, S1/S2 +, no rubs, no gallops Respiratory: CTA bilaterally, no wheezing, no rhonchi Abdominal: Soft, NT, ND, bowel sounds + Extremities: no edema, no cyanosis    The results of significant diagnostics from this hospitalization (including imaging, microbiology, ancillary and laboratory) are listed  below for reference.     Microbiology: No results found for this or any previous visit (from the past 240 hour(s)).   Labs: BNP (last 3 results)  Recent Labs  08/14/16 1612  BNP [id number]   Basic Metabolic Panel:  Recent Labs Lab 08/16/16 0347 08/17/16 0854 08/18/16 0520 08/19/16 0413 08/20/16 0605  NA 139 138 139 136 135  K 4.2 3.9 4.0 4.0 4.0  CL 92* 91* 91* 89* 92*  CO2 36* 37* 38* 36* 32  GLUCOSE 106* 137* 115* 101* 108*  BUN 9 5* 9 11 13   CREATININE 1.01 0.90 0.93 0.87 0.83  CALCIUM 8.1* 8.8* 8.8* 9.0 9.5   Liver Function Tests:  Recent Labs Lab 08/14/16 1530  AST 57*  ALT 20  ALKPHOS 54  BILITOT 0.9  PROT 6.9  ALBUMIN 3.0*   No results for input(s): LIPASE, AMYLASE in the last 168 hours. No results for input(s): AMMONIA in the last 168 hours. CBC:  Recent Labs Lab 08/14/16 1604 08/15/16 0805 08/16/16 0347 08/19/16 0413 08/20/16 0605  WBC 6.5 5.9 6.1 6.6 6.1  NEUTROABS 4.1  --   --   --   --   HGB 12.4* 12.8* 12.3* 12.3* 13.3  HCT 42.6 44.7 42.6 41.2 44.6  MCV 95.3 97.4 97.3 92.8 91.8  PLT 143* 127* 149* 153 156   Cardiac Enzymes:  Recent Labs Lab 08/14/16 2153 08/15/16 0205 08/15/16 0805  TROPONINI <0.03 <0.03 <0.03   BNP: Invalid input(s): POCBNP CBG: No results for input(s): GLUCAP in the last 168 hours. D-Dimer No results for input(s): DDIMER in the last 72 hours. Hgb A1c No results for input(s): HGBA1C in the last 72 hours. Lipid Profile No results for input(s): CHOL, HDL, LDLCALC, TRIG, CHOLHDL, LDLDIRECT in the last 72 hours. Thyroid function studies No results for input(s): TSH, T4TOTAL, T3FREE, THYROIDAB  in the last 72 hours.  Invalid input(s): FREET3 Anemia work up No results for input(s): VITAMINB12, FOLATE, FERRITIN, TIBC, IRON, RETICCTPCT in the last 72 hours. Urinalysis    Component Value Date/Time   COLORURINE YELLOW 06/10/2015 1106   APPEARANCEUR TURBID (A) 06/10/2015 1106   LABSPEC 1.011 06/10/2015 1106    PHURINE 7.0 06/10/2015 1106   GLUCOSEU NEGATIVE 06/10/2015 1106   HGBUR LARGE (A) 06/10/2015 1106   BILIRUBINUR NEGATIVE 06/10/2015 1106   KETONESUR NEGATIVE 06/10/2015 1106   PROTEINUR 30 (A) 06/10/2015 1106   UROBILINOGEN 1.0 06/10/2015 1106   NITRITE POSITIVE (A) 06/10/2015 1106   LEUKOCYTESUR LARGE (A) 06/10/2015 1106   Sepsis Labs Invalid input(s): PROCALCITONIN,  WBC,  LACTICIDVEN Microbiology No results found for this or any previous visit (from the past 240 hour(s)).   Time coordinating discharge: Over 30 minutes  SIGNED:   Debbe Odea, MD  [facility] 08/20/2016, 8:41 AM Pager   If 7PM-7AM, please contact night-coverage [email or web address] Password [email or web address]

## 2016-08-20 NOTE — Care Management Note (Addendum)
Case Management Note  Patient Details  Name: Jerry [name] MRN: [id number] Date of Birth: 07/12/1951  Subjective/Objective:                 Spoke with patient at the bedside. He states he has a Therapist, sports (CM) through [employer]. He is not active with a [facility]. Would like to use [facility], referral placed to [facility], clinical liaison, left VM. Patient states he has a walker, denies needs for additional DME. Lives at home with his children, they provide transportation to appointments and get his prescriptions. Bedside nurse to do walking O2 test, CM requested she notify me at [phone] if patient needs O2 for DC. RN verbalized understanding.  PCP [facility] [place].   Action/Plan:  DC to home with family, HH through [facility].  Note entered that qualified pt for home oxygen, referral made to [place], DME liaison [facility] for home O2 with need for transport tank for discharge today.    Expected Discharge Date:  08/20/16               Expected Discharge Plan:  [place]  In-House Referral:     Discharge planning Services  CM Consult  Post Acute Care Choice:  Home Health Choice offered to:  Patient  DME Arranged:    DME Agency:     HH Arranged:  RN, PT, Nurse's Aide [place] Agency:  [place]  Status of Service:  Completed, signed off  If discussed at [place] of Stay Meetings, dates discussed:    Additional Comments:  Carles Collet, RN 08/20/2016, 9:15 AM

## 2016-10-23 IMAGING — CR DG CHEST 2V
2 series · 2 of 2 positions shown · non-contrast
Comparison: Chest CT 04/25/2015

CLINICAL DATA: Preop.  Removal of tumor on right side of chest.

EXAM:
CHEST  2 VIEW

[w chest pa]
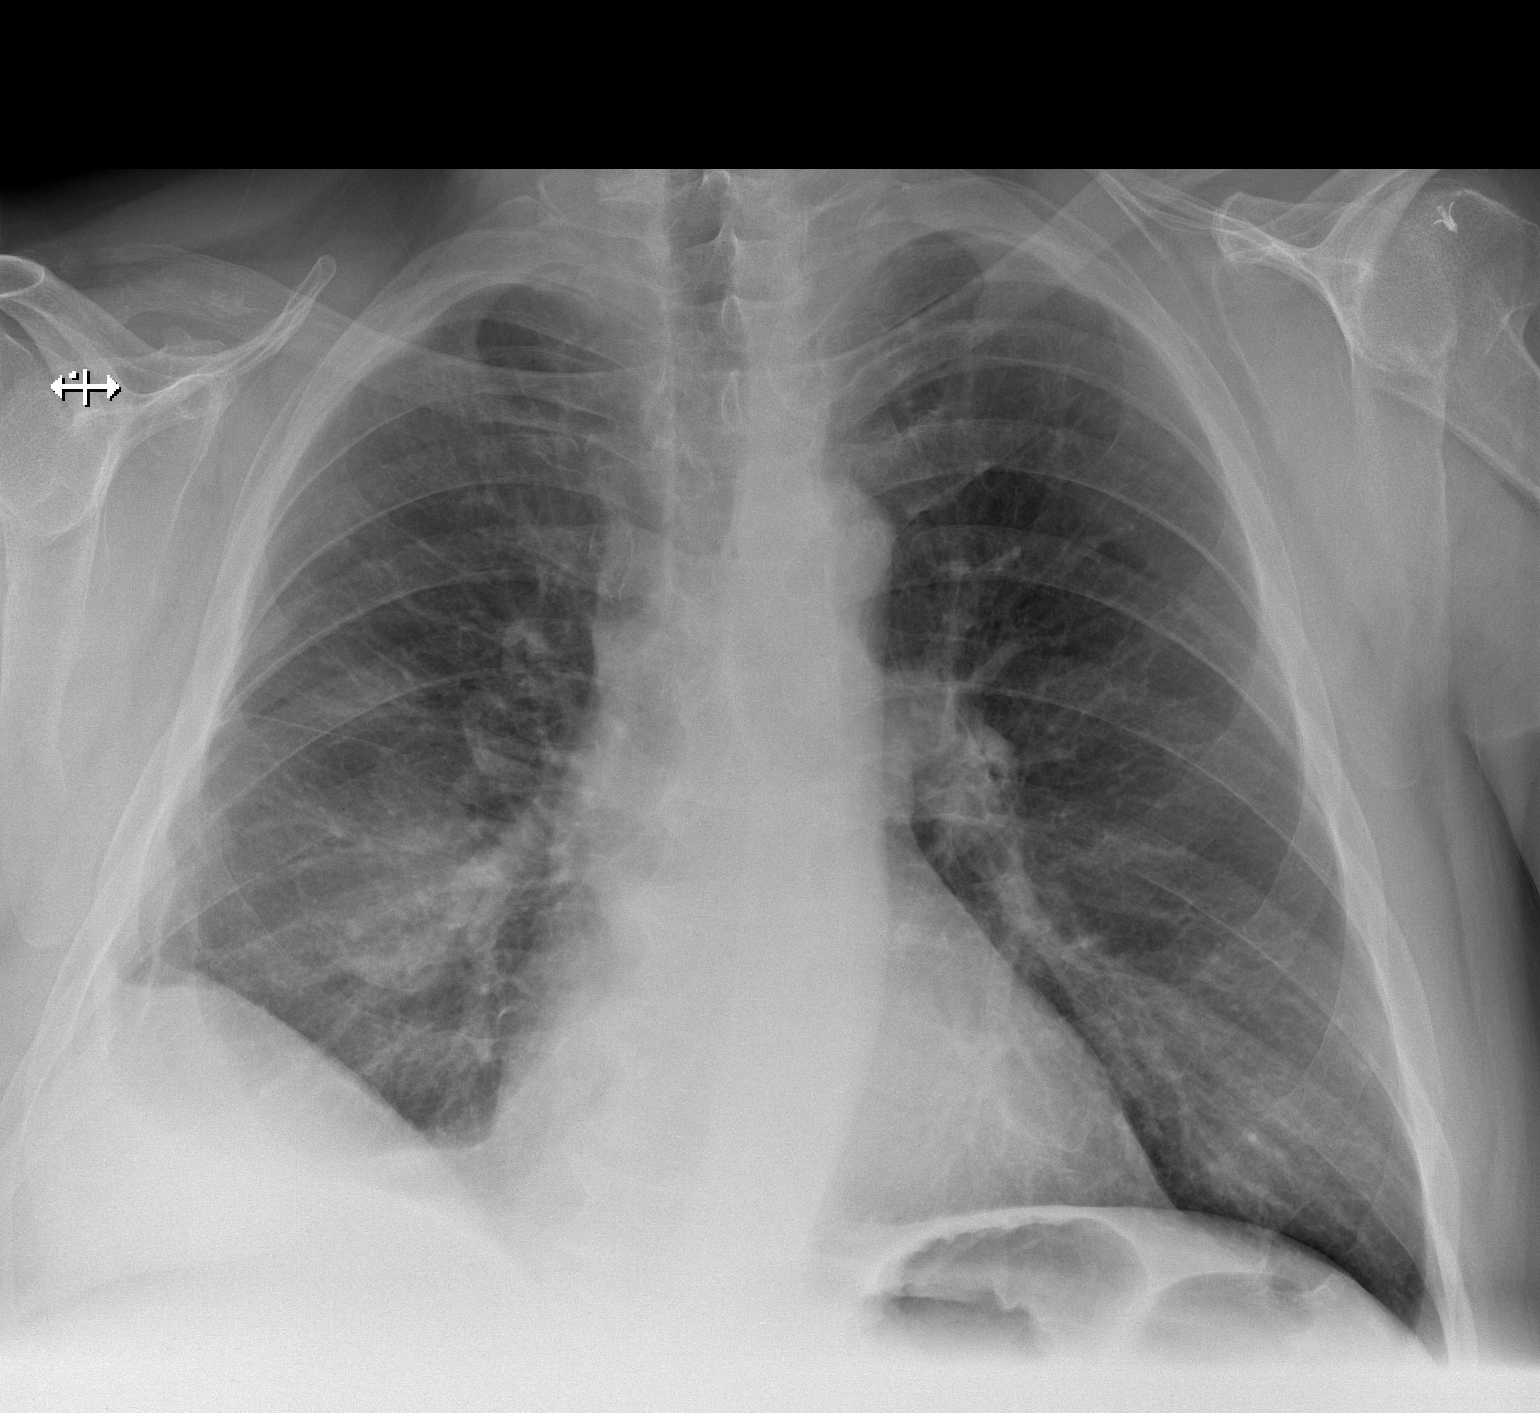

[w chest lat]
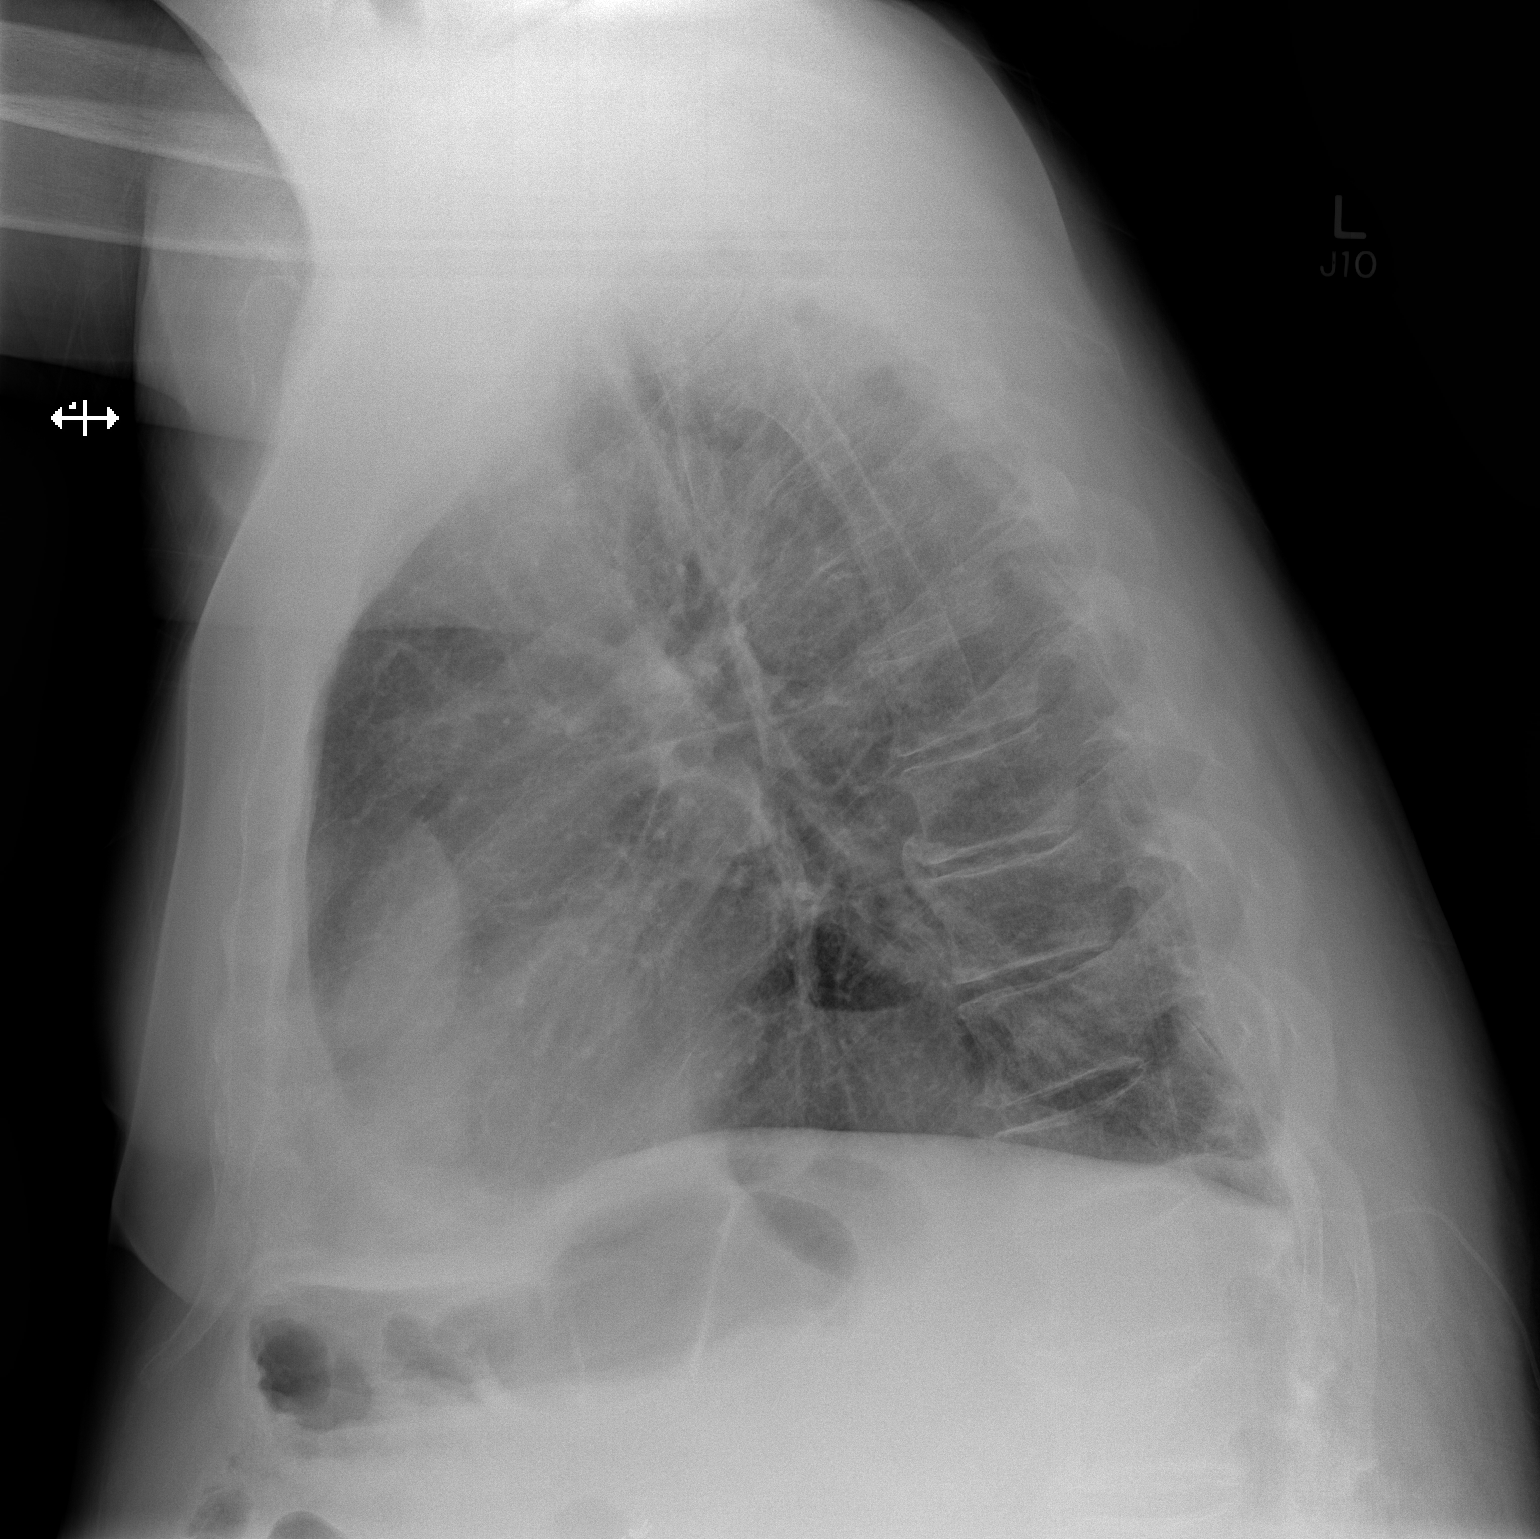

[2 of 2 positions shown; findings below may reference images not displayed]

FINDINGS: The right anterior lower rib mass projects as a vague opacity over
the right lower chest. This is better seen on chest CT. Heart is
normal size. No confluent airspace opacities or effusions. No acute
bony abnormality.
IMPRESSION: Right anterior lower rib mass.

No acute findings.

## 2016-10-26 IMAGING — CR DG CHEST 1V PORT
1 series · 1 of 1 positions shown · non-contrast
Comparison: 05/02/2015

CLINICAL DATA: Mass of chest wall, right   S/P RESECTION

EXAM:
PORTABLE CHEST - 1 VIEW

[AP]
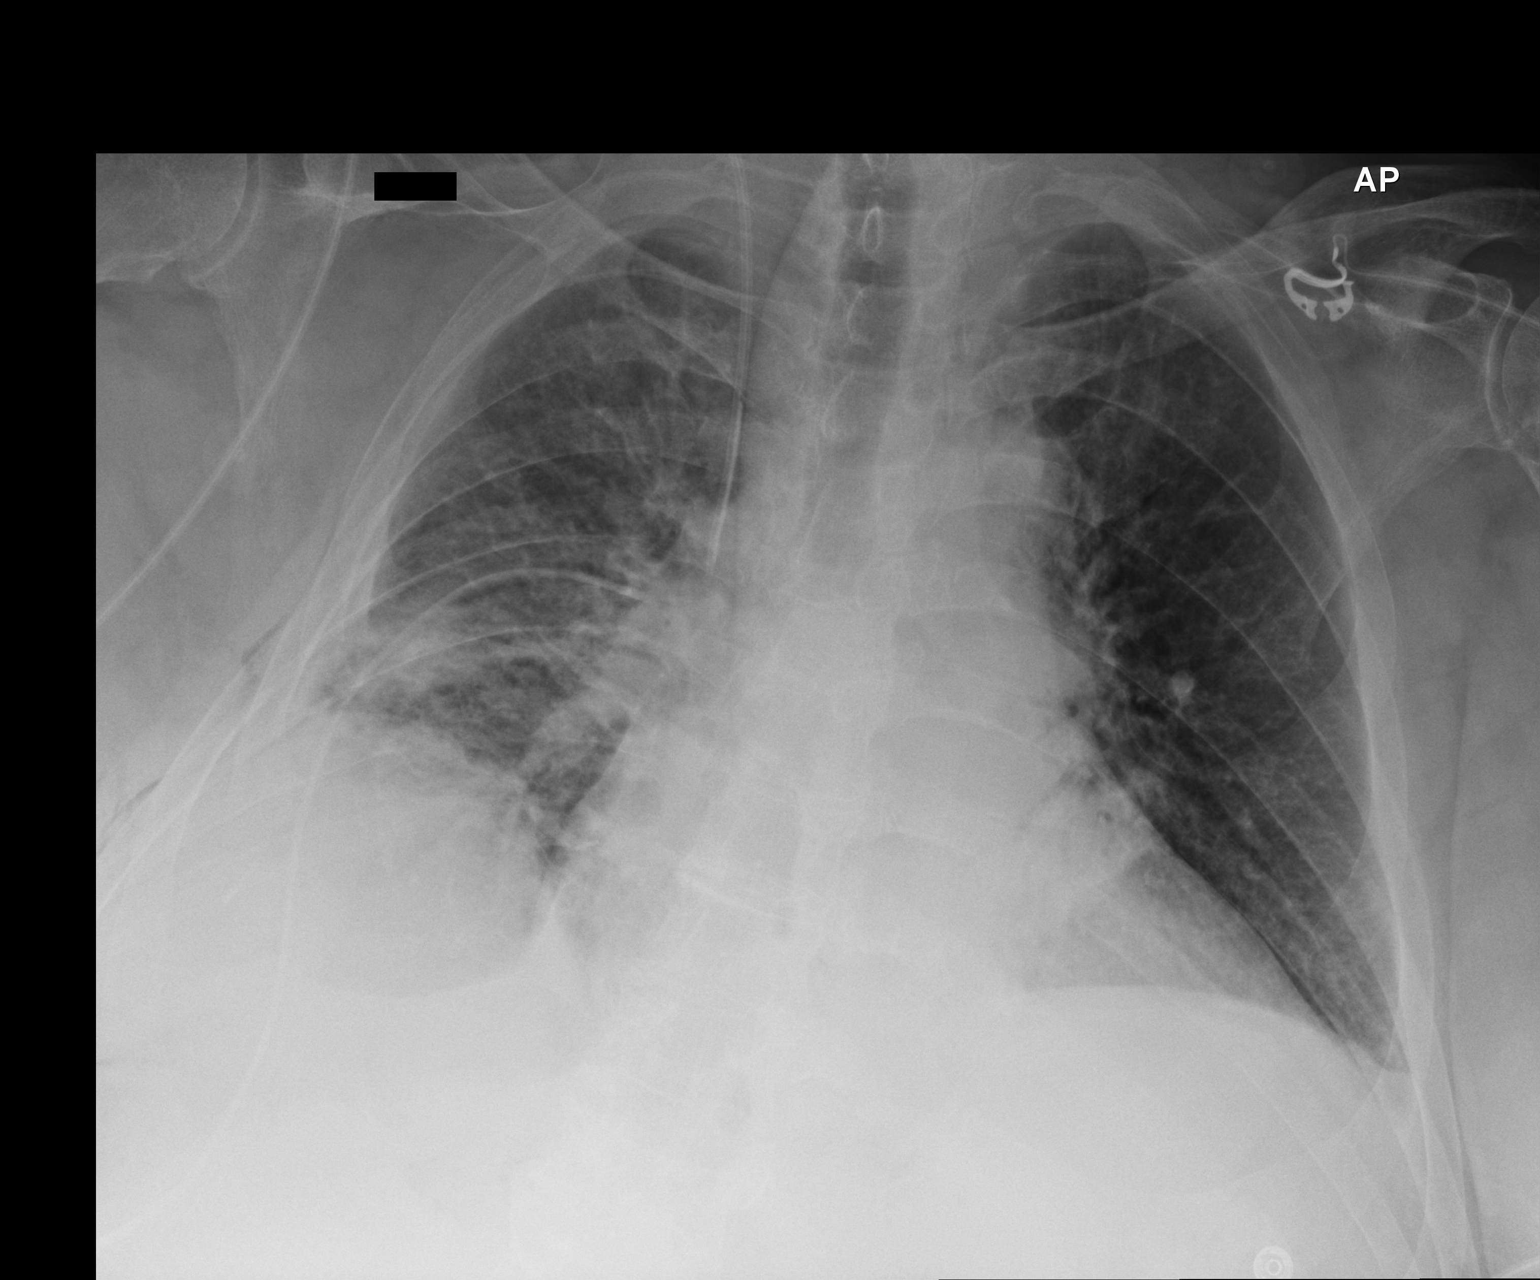

[1 of 1 positions shown; findings below may reference images not displayed]

FINDINGS: Right IJ central line extends to the low SVC. No pneumothorax. Drain
projects over the right lower lung. There is a small amount of
subcutaneous emphysema lateral to the thoracic cage on the right.
Patchy consolidation/atelectasis laterally at the right lung base
and in the left infrahilar region. Heart size upper limits normal
for technique.
No effusion.
Visualized skeletal structures are unremarkable.
IMPRESSION: 1. Central line to proximal SVC without pneumothorax.
2. Lateral right lower lung and left infrahilar
consolidation/atelectasis.

## 2016-10-27 IMAGING — CR DG CHEST 1V PORT
1 series · 1 of 1 positions shown · non-contrast
Comparison: Earlier today

CLINICAL DATA: Acute respiratory distress

EXAM:
PORTABLE CHEST 1 VIEW

[AP]
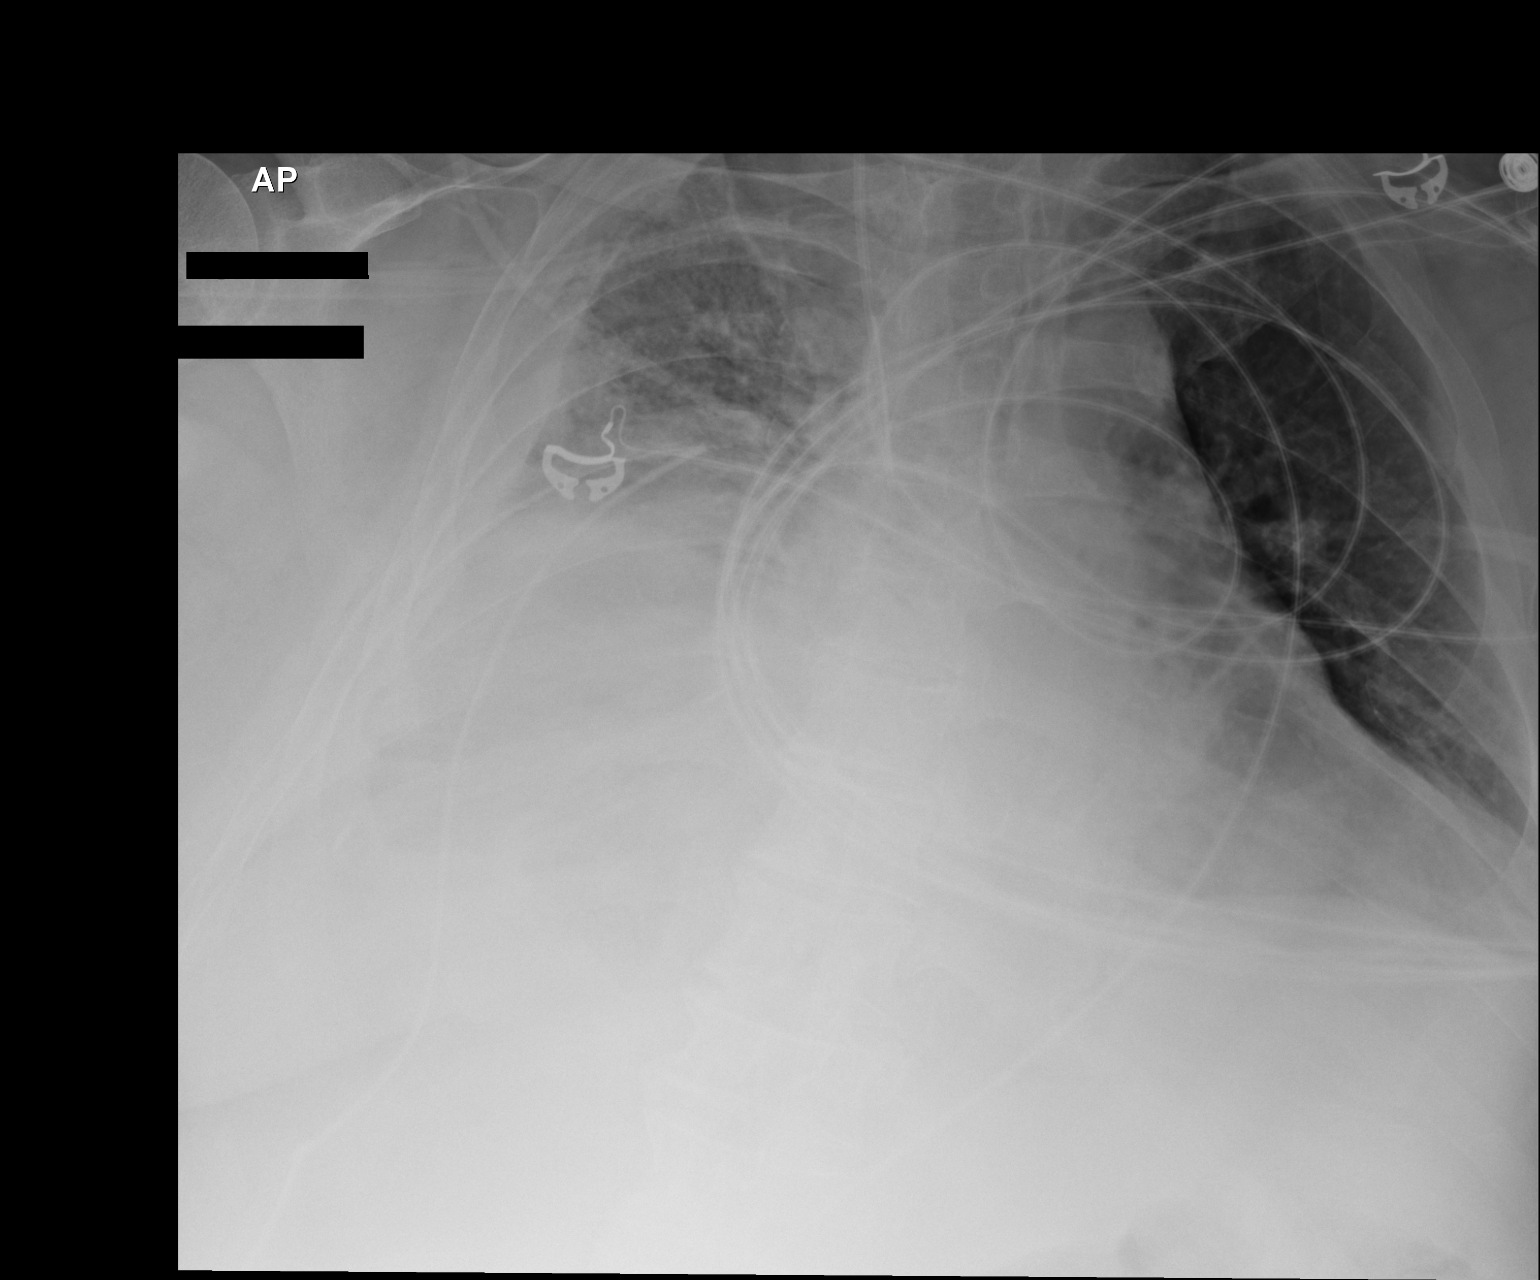

[1 of 1 positions shown; findings below may reference images not displayed]

FINDINGS: There is a veil like opacity over the right chest which is best
ascribed to pleural fluid given extension superiorly with apical
capping. The right upper lobe has new interstitial coarsening,
likely congestion and atelectatic change. Right-sided thoracic drain
in stable position - unsure if this is pleural or chest wall.

Unchanged cardiomegaly. Study limited for detecting mediastinal
shift given leftward rotation. No visible air leak. Right IJ central
line, tip at the SVC level. The left lung has stable aeration.

Critical Value/emergent results were called by telephone at the time
of interpretation on 05/07/2015 at [DATE] to Dr. Lematandiana , who
verbally acknowledged these results.
IMPRESSION: Rapid development of sizable right effusion, presumably hemothorax.
Extensive right lung atelectasis. Right chest drain has stable
positioning.

## 2016-10-30 IMAGING — CR DG CHEST 1V PORT
2 series · 2 of 2 positions shown · non-contrast
Comparison: Portable chest x-ray May 09, 2015 at [DATE] a.m.

CLINICAL DATA: Status post thoracotomy with chest wall resection
for mass and evacuation of hematoma, history of angioplasty

EXAM:
PORTABLE CHEST 1 VIEW

[AP (1 of 2)]
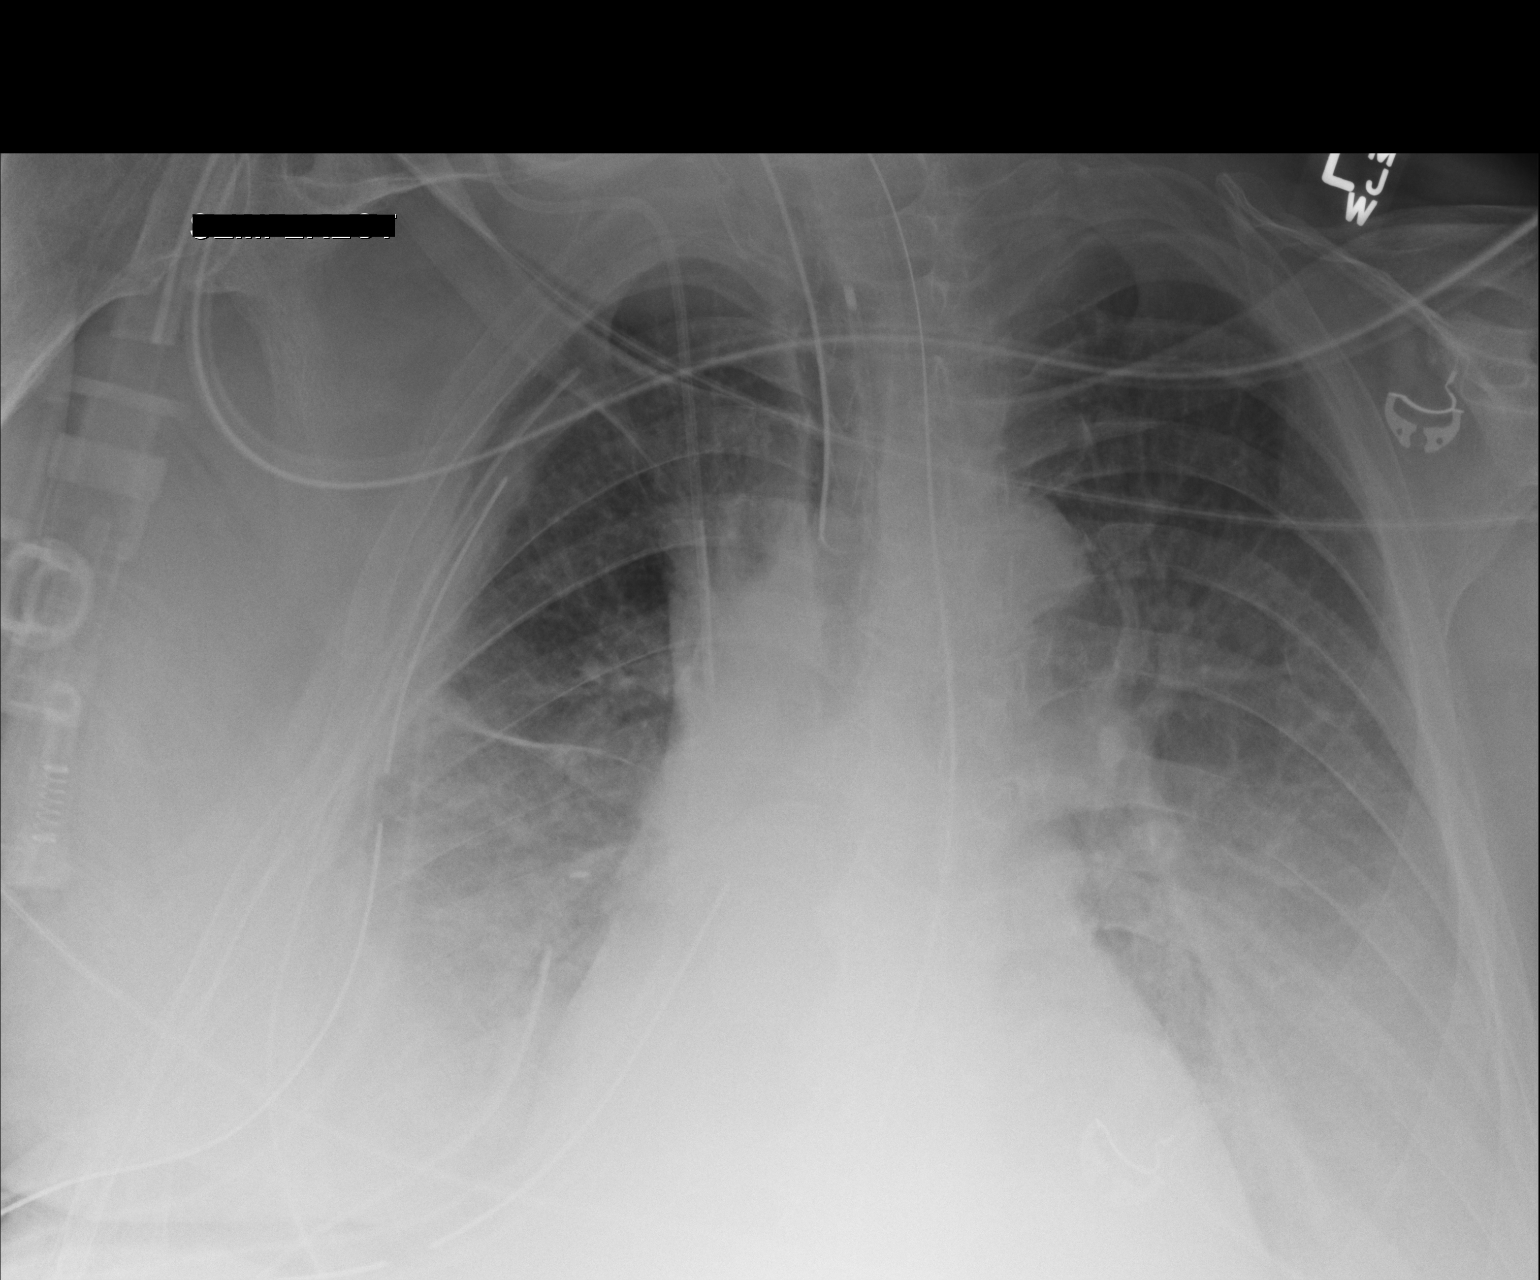

[AP (2 of 2)]
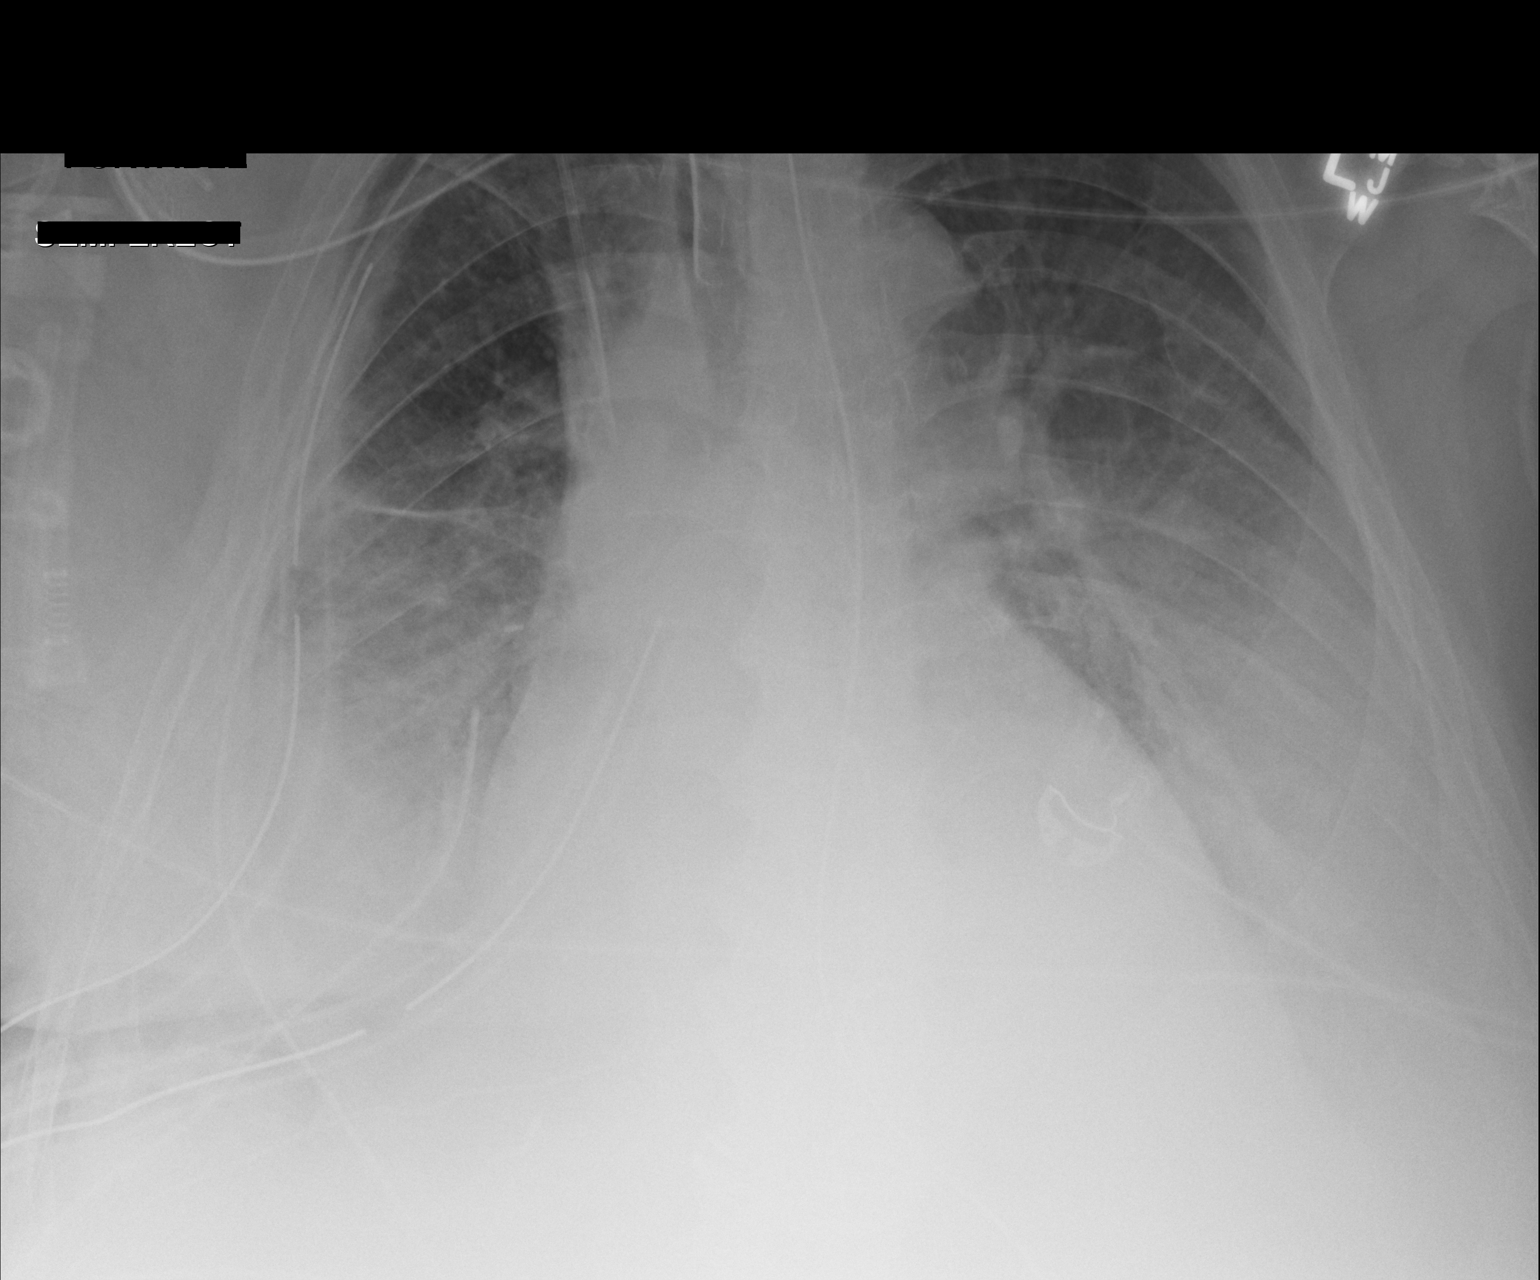

[2 of 2 positions shown; findings below may reference images not displayed]

FINDINGS: There is been interval intubation of the trachea and of the
esophagus. The endotracheal tube tip lies 4 cm above the carina. The
esophagogastric tube tip projects below the inferior margin of the
image. The 3 right-sided chest tubes are in reasonable position.
Aeration of the right lung has improved. The pleural effusion on the
right has decreased. On the left there is persistent increased
density inferiorly with obscuration of the hemidiaphragm. No
pneumothorax is exhibited. The cardiac silhouette is mildly
enlarged. The pulmonary vascularity is prominent but less engorged
than on the earlier study. The right internal jugular venous
catheter tip projects over the proximal SVC.
IMPRESSION: 1. Interval intubation of the trachea with improved aeration of both
lungs.
2. Three right-sided chest tubes without evidence of a pneumothorax.
A small or moderate right pleural effusion is present. There is
likely pleural fluid on the left as well layering posteriorly.
3. Mild cardiomegaly with mild central pulmonary vascular
congestion.

## 2016-11-27 ENCOUNTER — Encounter: Payer: Medicare HMO | Admitting: Thoracic Surgery (Cardiothoracic Vascular Surgery)

## 2016-11-30 ENCOUNTER — Other Ambulatory Visit: Payer: Self-pay | Admitting: Thoracic Surgery (Cardiothoracic Vascular Surgery)

## 2016-11-30 DIAGNOSIS — C413 Malignant neoplasm of ribs, sternum and clavicle: Secondary | ICD-10-CM

## 2016-11-30 DIAGNOSIS — I25119 Atherosclerotic heart disease of native coronary artery with unspecified angina pectoris: Secondary | ICD-10-CM

## 2016-12-04 ENCOUNTER — Ambulatory Visit (INDEPENDENT_AMBULATORY_CARE_PROVIDER_SITE_OTHER): Payer: Medicare HMO | Admitting: Thoracic Surgery (Cardiothoracic Vascular Surgery)

## 2016-12-04 ENCOUNTER — Ambulatory Visit
Admission: RE | Admit: 2016-12-04 | Discharge: 2016-12-04 | Disposition: A | Discharge status: Home / Self Care | Payer: Medicare HMO | Source: Ambulatory Visit | Attending: Thoracic Surgery (Cardiothoracic Vascular Surgery) | Admitting: Thoracic Surgery (Cardiothoracic Vascular Surgery)

## 2016-12-04 ENCOUNTER — Encounter: Payer: Self-pay | Admitting: Thoracic Surgery (Cardiothoracic Vascular Surgery)

## 2016-12-04 VITALS — BP 127/73 | HR 88 | Resp 18 | Ht 73.0 in | Wt 304.0 lb

## 2016-12-04 DIAGNOSIS — Z9889 Other specified postprocedural states: Secondary | ICD-10-CM | POA: Diagnosis not present

## 2016-12-04 DIAGNOSIS — C413 Malignant neoplasm of ribs, sternum and clavicle: Secondary | ICD-10-CM

## 2016-12-04 DIAGNOSIS — C493 Malignant neoplasm of connective and soft tissue of thorax: Secondary | ICD-10-CM | POA: Diagnosis not present
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
## 2017-05-30 ENCOUNTER — Other Ambulatory Visit: Payer: Self-pay | Admitting: *Deleted

## 2017-06-10 ENCOUNTER — Other Ambulatory Visit: Payer: Self-pay | Admitting: *Deleted

## 2017-06-10 DIAGNOSIS — C419 Malignant neoplasm of bone and articular cartilage, unspecified: Secondary | ICD-10-CM

## 2017-06-19 ENCOUNTER — Encounter: Payer: Self-pay | Admitting: Thoracic Surgery (Cardiothoracic Vascular Surgery)

## 2017-07-02 ENCOUNTER — Ambulatory Visit: Payer: Medicare HMO | Admitting: Thoracic Surgery (Cardiothoracic Vascular Surgery)

## 2017-07-10 ENCOUNTER — Encounter (HOSPITAL_COMMUNITY): Payer: Self-pay | Admitting: *Deleted

## 2017-07-10 ENCOUNTER — Other Ambulatory Visit: Payer: Self-pay

## 2017-07-10 NOTE — Progress Notes (Signed)
   07/10/17 1050  OBSTRUCTIVE SLEEP APNEA  Have you ever been diagnosed with sleep apnea through a sleep study? No  Do you snore loudly (loud enough to be heard through closed doors)?  0  Do you often feel tired, fatigued, or sleepy during the daytime (such as falling asleep during driving or talking to someone)? 0  Has anyone observed you stop breathing during your sleep? 0  Do you have, or are you being treated for high blood pressure? 1  BMI more than 35 kg/m2? 1  Age > 50 (1-yes) 1  Neck circumference greater than:Male 16 inches or larger, Male 17inches or larger? 1  Male Gender (Yes=1) 1  Obstructive Sleep Apnea Score 5  Score 5 or greater  Results sent to PCP
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
## 2017-07-10 NOTE — Progress Notes (Signed)
Spoke with pt for pre-op call. Pt has CAD history, but no longer sees a cardiologist. Denies any recent chest pain. Does have sob at times, is on O2 as needed. Pt states he's not diabetic.

## 2017-07-11 ENCOUNTER — Ambulatory Visit (HOSPITAL_COMMUNITY): Payer: Medicare HMO | Admitting: Vascular Surgery

## 2017-07-11 ENCOUNTER — Ambulatory Visit (HOSPITAL_COMMUNITY)
Admission: RE | Admit: 2017-07-11 | Discharge: 2017-07-11 | Disposition: A | Discharge status: Home / Self Care | Payer: Medicare HMO | Source: Ambulatory Visit | Attending: Thoracic Surgery (Cardiothoracic Vascular Surgery) | Admitting: Thoracic Surgery (Cardiothoracic Vascular Surgery)

## 2017-07-11 ENCOUNTER — Encounter (HOSPITAL_COMMUNITY): Payer: Self-pay | Admitting: *Deleted

## 2017-07-11 ENCOUNTER — Encounter (HOSPITAL_COMMUNITY)
Admission: RE | Disposition: A | Discharge status: Home / Self Care | Payer: Self-pay | Source: Ambulatory Visit | Attending: Thoracic Surgery (Cardiothoracic Vascular Surgery)

## 2017-07-11 DIAGNOSIS — I119 Hypertensive heart disease without heart failure: Secondary | ICD-10-CM

## 2017-07-11 DIAGNOSIS — R1084 Generalized abdominal pain: Secondary | ICD-10-CM | POA: Diagnosis not present

## 2017-07-11 DIAGNOSIS — Z87891 Personal history of nicotine dependence: Secondary | ICD-10-CM | POA: Insufficient documentation

## 2017-07-11 DIAGNOSIS — K567 Ileus, unspecified: Secondary | ICD-10-CM | POA: Diagnosis not present

## 2017-07-11 DIAGNOSIS — I2510 Atherosclerotic heart disease of native coronary artery without angina pectoris: Secondary | ICD-10-CM

## 2017-07-11 DIAGNOSIS — Z96651 Presence of right artificial knee joint: Secondary | ICD-10-CM

## 2017-07-11 DIAGNOSIS — Z6839 Body mass index (BMI) 39.0-39.9, adult: Secondary | ICD-10-CM | POA: Insufficient documentation

## 2017-07-11 DIAGNOSIS — C419 Malignant neoplasm of bone and articular cartilage, unspecified: Secondary | ICD-10-CM | POA: Insufficient documentation

## 2017-07-11 DIAGNOSIS — I252 Old myocardial infarction: Secondary | ICD-10-CM | POA: Insufficient documentation

## 2017-07-11 DIAGNOSIS — Z955 Presence of coronary angioplasty implant and graft: Secondary | ICD-10-CM | POA: Insufficient documentation

## 2017-07-11 DIAGNOSIS — I700 Atherosclerosis of aorta: Secondary | ICD-10-CM

## 2017-07-11 HISTORY — DX: Dyspnea, unspecified: R06.00

## 2017-07-11 HISTORY — DX: Anemia, unspecified: D64.9

## 2017-07-11 HISTORY — PX: RADIOLOGY WITH ANESTHESIA: SHX6223

## 2017-07-11 LAB — BASIC METABOLIC PANEL
Anion gap: 12 (ref 5–15)
BUN: 7 mg/dL (ref 6–20)
CHLORIDE: 93 mmol/L — AB (ref 101–111)
CO2: 31 mmol/L (ref 22–32)
CREATININE: 0.98 mg/dL (ref 0.61–1.24)
Calcium: 9.3 mg/dL (ref 8.9–10.3)
GFR calc Af Amer: >60 mL/min (ref >60)
GFR calc non Af Amer: >60 mL/min (ref >60)
Glucose, Bld: 130 mg/dL — ABNORMAL HIGH (ref 65–99)
Potassium: 3.4 mmol/L — ABNORMAL LOW (ref 3.5–5.1)
Sodium: 136 mmol/L (ref 135–145)

## 2017-07-11 SURGERY — MRI WITH ANESTHESIA
Anesthesia: General

## 2017-07-11 MED ORDER — FENTANYL CITRATE (PF) 100 MCG/2ML IJ SOLN
INTRAMUSCULAR | Status: DC | PRN
  Administered 2017-07-11 (×2): 50 ug via INTRAVENOUS

## 2017-07-11 MED ORDER — LACTATED RINGERS IV SOLN
INTRAVENOUS | Status: DC
  Administered 2017-07-11: 11:00:00 via INTRAVENOUS

## 2017-07-11 MED ORDER — SUCCINYLCHOLINE CHLORIDE 200 MG/10ML IV SOSY
PREFILLED_SYRINGE | INTRAVENOUS | Status: DC | PRN
  Administered 2017-07-11: 100 mg via INTRAVENOUS

## 2017-07-11 MED ORDER — PROPOFOL 10 MG/ML IV BOLUS
INTRAVENOUS | Status: DC | PRN
  Administered 2017-07-11: 150 mg via INTRAVENOUS

## 2017-07-11 MED ORDER — OXYCODONE HCL 5 MG/5ML PO SOLN: 5.0000 mg | Freq: Once | ORAL | Status: DC | PRN

## 2017-07-11 MED ORDER — OXYCODONE HCL 5 MG PO TABS: 5.0000 mg | ORAL_TABLET | Freq: Once | ORAL | Status: DC | PRN

## 2017-07-11 MED ORDER — FENTANYL CITRATE (PF) 100 MCG/2ML IJ SOLN: 25.0000 ug | INTRAMUSCULAR | Status: DC | PRN

## 2017-07-11 MED ORDER — LIDOCAINE 2% (20 MG/ML) 5 ML SYRINGE
INTRAMUSCULAR | Status: DC | PRN
  Administered 2017-07-11: 80 mg via INTRAVENOUS

## 2017-07-11 MED ORDER — ONDANSETRON HCL 4 MG/2ML IJ SOLN
INTRAMUSCULAR | Status: DC | PRN
  Administered 2017-07-11: 4 mg via INTRAVENOUS

## 2017-07-11 MED ORDER — MIDAZOLAM HCL 5 MG/5ML IJ SOLN
INTRAMUSCULAR | Status: DC | PRN
  Administered 2017-07-11: 1 mg via INTRAVENOUS

## 2017-07-11 MED ORDER — ONDANSETRON HCL 4 MG/2ML IJ SOLN: 4.0000 mg | Freq: Four times a day (QID) | INTRAMUSCULAR | Status: DC | PRN

## 2017-07-11 NOTE — Anesthesia Procedure Notes (Addendum)
Procedure Name: Intubation Performed by: Lowella Dell, CRNA Pre-anesthesia Checklist: Patient identified, Emergency Drugs available, Suction available and Patient being monitored Patient Re-evaluated:Patient Re-evaluated prior to induction Oxygen Delivery Method: Circle System Utilized Preoxygenation: Pre-oxygenation with 100% oxygen Induction Type: IV induction Ventilation: Mask ventilation without difficulty Laryngoscope Size: Mac and 4 Grade View: Grade I Tube type: Oral Tube size: 8.0 mm Number of attempts: 1 Airway Equipment and Method: Stylet Placement Confirmation: ETT inserted through vocal cords under direct vision,  positive ETCO2 and breath sounds checked- equal and bilateral Secured at: 23 cm Tube secured with: Tape Dental Injury: Teeth and Oropharynx as per pre-operative assessment

## 2017-07-11 NOTE — Anesthesia Preprocedure Evaluation (Signed)
Anesthesia Evaluation  Patient identified by MRN, date of birth, ID band Patient awake    Reviewed: Allergy & Precautions, H&P , NPO status , Patient's Chart, lab work & pertinent test results  Airway Mallampati: II   Neck ROM: full    Dental   Pulmonary shortness of breath, former smoker,    breath sounds clear to auscultation       Cardiovascular hypertension, + CAD and +CHF   Rhythm:regular Rate:Normal  TTE (08/2016): EF 55%   Neuro/Psych Anxiety Depression  Neuromuscular disease    GI/Hepatic GERD  ,  Endo/Other  Morbid obesity  Renal/GU      Musculoskeletal  (+) Arthritis ,   Abdominal   Peds  Hematology   Anesthesia Other Findings   Reproductive/Obstetrics                             Anesthesia Physical Anesthesia Plan  ASA: III  Anesthesia Plan: General   Post-op Pain Management:    Induction: Intravenous  PONV Risk Score and Plan: 2 and Ondansetron, Dexamethasone and Treatment may vary due to age or medical condition  Airway Management Planned: Oral ETT  Additional Equipment:   Intra-op Plan:   Post-operative Plan: Extubation in OR  Informed Consent: I have reviewed the patients History and Physical, chart, labs and discussed the procedure including the risks, benefits and alternatives for the proposed anesthesia with the patient or authorized representative who has indicated his/her understanding and acceptance.     Plan Discussed with: CRNA, Anesthesiologist and Surgeon  Anesthesia Plan Comments:         Anesthesia Quick Evaluation

## 2017-07-11 NOTE — Transfer of Care (Signed)
Immediate Anesthesia Transfer of Care Note  Patient: Jerry [name]  Procedure(s) Performed: CT CHEST WITHOUT  CONTRAST (N/A )  Patient Location: PACU  Anesthesia Type:General  Level of Consciousness: awake, alert , oriented and patient cooperative  Airway & Oxygen Therapy: Patient Spontanous Breathing and Patient connected to nasal cannula oxygen  Post-op Assessment: Report given to RN, Post -op Vital signs reviewed and stable and Patient moving all extremities  Post vital signs: Reviewed and stable  Last Vitals:  Vitals:   07/11/17 1125 07/11/17 1126  BP: (!) 88/49 (!) 111/57  Pulse: 66 66  Resp: 13 14  Temp: 36.7 C   SpO2: 98% 95%    Last Pain:  Vitals:   07/11/17 1125  TempSrc:   PainSc: 4       Patients Stated Pain Goal: 3 (94/17/40 8144)  Complications: No apparent anesthesia complications

## 2017-07-12 ENCOUNTER — Encounter (HOSPITAL_COMMUNITY): Payer: Self-pay | Admitting: Radiology

## 2017-07-12 ENCOUNTER — Emergency Department (HOSPITAL_COMMUNITY): Payer: Medicare HMO

## 2017-07-12 ENCOUNTER — Other Ambulatory Visit: Payer: Self-pay

## 2017-07-12 ENCOUNTER — Inpatient Hospital Stay (HOSPITAL_COMMUNITY)
Admission: EM | Admit: 2017-07-12 | Discharge: 2017-07-16 | DRG: 389 | Disposition: A | Discharge status: Home Health | Payer: Medicare HMO | Attending: Family Medicine | Admitting: Family Medicine

## 2017-07-12 DIAGNOSIS — Z87891 Personal history of nicotine dependence: Secondary | ICD-10-CM

## 2017-07-12 DIAGNOSIS — Z96651 Presence of right artificial knee joint: Secondary | ICD-10-CM | POA: Diagnosis present

## 2017-07-12 DIAGNOSIS — I110 Hypertensive heart disease with heart failure: Secondary | ICD-10-CM | POA: Diagnosis present

## 2017-07-12 DIAGNOSIS — Z85830 Personal history of malignant neoplasm of bone: Secondary | ICD-10-CM

## 2017-07-12 DIAGNOSIS — E785 Hyperlipidemia, unspecified: Secondary | ICD-10-CM | POA: Diagnosis present

## 2017-07-12 DIAGNOSIS — I2510 Atherosclerotic heart disease of native coronary artery without angina pectoris: Secondary | ICD-10-CM | POA: Diagnosis present

## 2017-07-12 DIAGNOSIS — Z6838 Body mass index (BMI) 38.0-38.9, adult: Secondary | ICD-10-CM

## 2017-07-12 DIAGNOSIS — Z79891 Long term (current) use of opiate analgesic: Secondary | ICD-10-CM

## 2017-07-12 DIAGNOSIS — K567 Ileus, unspecified: Secondary | ICD-10-CM | POA: Diagnosis not present

## 2017-07-12 DIAGNOSIS — R402362 Coma scale, best motor response, obeys commands, at arrival to emergency department: Secondary | ICD-10-CM | POA: Diagnosis present

## 2017-07-12 DIAGNOSIS — G894 Chronic pain syndrome: Secondary | ICD-10-CM | POA: Diagnosis present

## 2017-07-12 DIAGNOSIS — R402142 Coma scale, eyes open, spontaneous, at arrival to emergency department: Secondary | ICD-10-CM | POA: Diagnosis present

## 2017-07-12 DIAGNOSIS — K5903 Drug induced constipation: Secondary | ICD-10-CM | POA: Diagnosis not present

## 2017-07-12 DIAGNOSIS — R1084 Generalized abdominal pain: Secondary | ICD-10-CM

## 2017-07-12 DIAGNOSIS — Z888 Allergy status to other drugs, medicaments and biological substances status: Secondary | ICD-10-CM

## 2017-07-12 DIAGNOSIS — K611 Rectal abscess: Secondary | ICD-10-CM | POA: Diagnosis present

## 2017-07-12 DIAGNOSIS — K219 Gastro-esophageal reflux disease without esophagitis: Secondary | ICD-10-CM | POA: Diagnosis present

## 2017-07-12 DIAGNOSIS — Z955 Presence of coronary angioplasty implant and graft: Secondary | ICD-10-CM

## 2017-07-12 DIAGNOSIS — M549 Dorsalgia, unspecified: Secondary | ICD-10-CM

## 2017-07-12 DIAGNOSIS — Z880 Allergy status to penicillin: Secondary | ICD-10-CM

## 2017-07-12 DIAGNOSIS — T402X5A Adverse effect of other opioids, initial encounter: Secondary | ICD-10-CM | POA: Diagnosis not present

## 2017-07-12 DIAGNOSIS — I5032 Chronic diastolic (congestive) heart failure: Secondary | ICD-10-CM | POA: Diagnosis present

## 2017-07-12 DIAGNOSIS — M5126 Other intervertebral disc displacement, lumbar region: Secondary | ICD-10-CM | POA: Diagnosis present

## 2017-07-12 DIAGNOSIS — Z7982 Long term (current) use of aspirin: Secondary | ICD-10-CM

## 2017-07-12 DIAGNOSIS — Z881 Allergy status to other antibiotic agents status: Secondary | ICD-10-CM

## 2017-07-12 DIAGNOSIS — Z9049 Acquired absence of other specified parts of digestive tract: Secondary | ICD-10-CM

## 2017-07-12 DIAGNOSIS — Z825 Family history of asthma and other chronic lower respiratory diseases: Secondary | ICD-10-CM

## 2017-07-12 DIAGNOSIS — R402252 Coma scale, best verbal response, oriented, at arrival to emergency department: Secondary | ICD-10-CM | POA: Diagnosis present

## 2017-07-12 LAB — CBC WITH DIFFERENTIAL/PLATELET
BASOS ABS: 0 10*3/uL (ref 0.0–0.1)
BASOS PCT: 0 %
Eosinophils Absolute: 0 10*3/uL (ref 0.0–0.7)
Eosinophils Relative: 1 %
HCT: 41.1 % (ref 39.0–52.0)
HEMOGLOBIN: 13.3 g/dL (ref 13.0–17.0)
Lymphocytes Relative: 17 %
Lymphs Abs: 1.5 10*3/uL (ref 0.7–4.0)
MCH: 30.4 pg (ref 26.0–34.0)
MCHC: 32.4 g/dL (ref 30.0–36.0)
MCV: 93.8 fL (ref 78.0–100.0)
Monocytes Absolute: 0.4 10*3/uL (ref 0.1–1.0)
Monocytes Relative: 4 %
NEUTROS ABS: 6.9 10*3/uL (ref 1.7–7.7)
NEUTROS PCT: 78 %
Platelets: 193 10*3/uL (ref 150–400)
RBC: 4.38 MIL/uL (ref 4.22–5.81)
RDW: 14 % (ref 11.5–15.5)
WBC: 8.9 10*3/uL (ref 4.0–10.5)

## 2017-07-12 LAB — COMPREHENSIVE METABOLIC PANEL
ALBUMIN: 3.9 g/dL (ref 3.5–5.0)
ALK PHOS: 59 U/L (ref 38–126)
ALT: 31 U/L (ref 17–63)
AST: 35 U/L (ref 15–41)
Anion gap: 12 (ref 5–15)
BILIRUBIN TOTAL: 0.7 mg/dL (ref 0.3–1.2)
BUN: 5 mg/dL — AB (ref 6–20)
CALCIUM: 9.6 mg/dL (ref 8.9–10.3)
CO2: 31 mmol/L (ref 22–32)
Chloride: 92 mmol/L — ABNORMAL LOW (ref 101–111)
Creatinine, Ser: 1.02 mg/dL (ref 0.61–1.24)
GFR calc Af Amer: >60 mL/min (ref >60)
GFR calc non Af Amer: >60 mL/min (ref >60)
GLUCOSE: 129 mg/dL — AB (ref 65–99)
POTASSIUM: 3.9 mmol/L (ref 3.5–5.1)
Sodium: 135 mmol/L (ref 135–145)
TOTAL PROTEIN: 7.9 g/dL (ref 6.5–8.1)

## 2017-07-12 MED ORDER — SODIUM CHLORIDE 0.9 % IV SOLN
INTRAVENOUS | Status: DC
  Administered 2017-07-12: 22:00:00 via INTRAVENOUS

## 2017-07-12 MED ORDER — IOPAMIDOL (ISOVUE-300) INJECTION 61%
INTRAVENOUS | Status: AC
  Filled 2017-07-12: qty 100

## 2017-07-12 MED ORDER — HYDROMORPHONE HCL 1 MG/ML IJ SOLN
1.0000 mg | Freq: Once | INTRAMUSCULAR | Status: AC
  Administered 2017-07-12: 1 mg via INTRAVENOUS
  Filled 2017-07-12: qty 1

## 2017-07-12 MED ORDER — LORAZEPAM 2 MG/ML IJ SOLN
1.0000 mg | Freq: Once | INTRAMUSCULAR | Status: AC
  Administered 2017-07-12: 1 mg via INTRAVENOUS
  Filled 2017-07-12: qty 1

## 2017-07-12 NOTE — ED Notes (Signed)
Pt unable to given a urine sample at this time
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
## 2017-07-12 NOTE — ED Notes (Signed)
ED Provider at bedside. 

## 2017-07-12 NOTE — ED Triage Notes (Signed)
Pt arrived from home via [facility] with reports of constipation X9 days and not passing gas in 10 hours. Pt belly is distended and pt c/o of significant back pain and inability to walk which is new for him. Pt A&OX4, pt has implanted morphine pump.
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
## 2017-07-12 NOTE — Anesthesia Postprocedure Evaluation (Signed)
Anesthesia Post Note  Patient: Jerry [name]  Procedure(s) Performed: CT CHEST WITHOUT  CONTRAST (N/A )     Patient location during evaluation: PACU Anesthesia Type: General Level of consciousness: awake and alert Pain management: pain level controlled Vital Signs Assessment: post-procedure vital signs reviewed and stable Respiratory status: spontaneous breathing, nonlabored ventilation, respiratory function stable and patient connected to nasal cannula oxygen Cardiovascular status: blood pressure returned to baseline and stable Postop Assessment: no apparent nausea or vomiting Anesthetic complications: no    Last Vitals:  Vitals:   07/11/17 1137 07/11/17 1200  BP: (!) 107/55 (!) 112/49  Pulse: (!) 57 61  Resp: (!) 21 16  Temp: 36.7 C 36.7 C  SpO2: 94% 98%    Last Pain:  Vitals:   07/11/17 1137  TempSrc:   PainSc: Asleep                 Criag Wicklund S

## 2017-07-13 DIAGNOSIS — I2510 Atherosclerotic heart disease of native coronary artery without angina pectoris: Secondary | ICD-10-CM | POA: Diagnosis present

## 2017-07-13 DIAGNOSIS — R402252 Coma scale, best verbal response, oriented, at arrival to emergency department: Secondary | ICD-10-CM | POA: Diagnosis present

## 2017-07-13 DIAGNOSIS — K5903 Drug induced constipation: Secondary | ICD-10-CM | POA: Diagnosis not present

## 2017-07-13 DIAGNOSIS — G894 Chronic pain syndrome: Secondary | ICD-10-CM | POA: Diagnosis present

## 2017-07-13 DIAGNOSIS — Z96651 Presence of right artificial knee joint: Secondary | ICD-10-CM | POA: Diagnosis present

## 2017-07-13 DIAGNOSIS — Z9049 Acquired absence of other specified parts of digestive tract: Secondary | ICD-10-CM | POA: Diagnosis not present

## 2017-07-13 DIAGNOSIS — Z825 Family history of asthma and other chronic lower respiratory diseases: Secondary | ICD-10-CM | POA: Diagnosis not present

## 2017-07-13 DIAGNOSIS — I5032 Chronic diastolic (congestive) heart failure: Secondary | ICD-10-CM | POA: Diagnosis present

## 2017-07-13 DIAGNOSIS — K611 Rectal abscess: Secondary | ICD-10-CM | POA: Diagnosis present

## 2017-07-13 DIAGNOSIS — Z85830 Personal history of malignant neoplasm of bone: Secondary | ICD-10-CM | POA: Diagnosis not present

## 2017-07-13 DIAGNOSIS — Z888 Allergy status to other drugs, medicaments and biological substances status: Secondary | ICD-10-CM | POA: Diagnosis not present

## 2017-07-13 DIAGNOSIS — M5126 Other intervertebral disc displacement, lumbar region: Secondary | ICD-10-CM | POA: Diagnosis present

## 2017-07-13 DIAGNOSIS — R402362 Coma scale, best motor response, obeys commands, at arrival to emergency department: Secondary | ICD-10-CM | POA: Diagnosis present

## 2017-07-13 DIAGNOSIS — E785 Hyperlipidemia, unspecified: Secondary | ICD-10-CM | POA: Diagnosis present

## 2017-07-13 DIAGNOSIS — R402142 Coma scale, eyes open, spontaneous, at arrival to emergency department: Secondary | ICD-10-CM | POA: Diagnosis present

## 2017-07-13 DIAGNOSIS — I110 Hypertensive heart disease with heart failure: Secondary | ICD-10-CM | POA: Diagnosis present

## 2017-07-13 DIAGNOSIS — Z87891 Personal history of nicotine dependence: Secondary | ICD-10-CM | POA: Diagnosis not present

## 2017-07-13 DIAGNOSIS — R1084 Generalized abdominal pain: Secondary | ICD-10-CM | POA: Diagnosis not present

## 2017-07-13 DIAGNOSIS — K219 Gastro-esophageal reflux disease without esophagitis: Secondary | ICD-10-CM | POA: Diagnosis present

## 2017-07-13 DIAGNOSIS — T402X5A Adverse effect of other opioids, initial encounter: Secondary | ICD-10-CM | POA: Diagnosis not present

## 2017-07-13 DIAGNOSIS — K567 Ileus, unspecified: Secondary | ICD-10-CM | POA: Diagnosis present

## 2017-07-13 DIAGNOSIS — Z880 Allergy status to penicillin: Secondary | ICD-10-CM | POA: Diagnosis not present

## 2017-07-13 DIAGNOSIS — Z881 Allergy status to other antibiotic agents status: Secondary | ICD-10-CM | POA: Diagnosis not present

## 2017-07-13 DIAGNOSIS — Z955 Presence of coronary angioplasty implant and graft: Secondary | ICD-10-CM | POA: Diagnosis not present

## 2017-07-13 DIAGNOSIS — Z7982 Long term (current) use of aspirin: Secondary | ICD-10-CM | POA: Diagnosis not present

## 2017-07-13 LAB — BASIC METABOLIC PANEL
Anion gap: 10 (ref 5–15)
BUN: 5 mg/dL — ABNORMAL LOW (ref 6–20)
CO2: 30 mmol/L (ref 22–32)
Calcium: 9 mg/dL (ref 8.9–10.3)
Chloride: 95 mmol/L — ABNORMAL LOW (ref 101–111)
Creatinine, Ser: 0.87 mg/dL (ref 0.61–1.24)
GFR calc Af Amer: >60 mL/min (ref >60)
GFR calc non Af Amer: >60 mL/min (ref >60)
Glucose, Bld: 106 mg/dL — ABNORMAL HIGH (ref 65–99)
Potassium: 3.2 mmol/L — ABNORMAL LOW (ref 3.5–5.1)
Sodium: 135 mmol/L (ref 135–145)

## 2017-07-13 LAB — URINALYSIS, ROUTINE W REFLEX MICROSCOPIC
Bilirubin Urine: NEGATIVE
Glucose, UA: NEGATIVE mg/dL
Hgb urine dipstick: NEGATIVE
Ketones, ur: NEGATIVE mg/dL
Leukocytes, UA: NEGATIVE
Nitrite: NEGATIVE
Protein, ur: NEGATIVE mg/dL
Specific Gravity, Urine: 1.012 (ref 1.005–1.030)
pH: 8 (ref 5.0–8.0)

## 2017-07-13 LAB — CBC
HEMATOCRIT: 38.5 % — AB (ref 39.0–52.0)
HEMOGLOBIN: 12.5 g/dL — AB (ref 13.0–17.0)
MCH: 30 pg (ref 26.0–34.0)
MCHC: 32.5 g/dL (ref 30.0–36.0)
MCV: 92.5 fL (ref 78.0–100.0)
Platelets: 172 10*3/uL (ref 150–400)
RBC: 4.16 MIL/uL — AB (ref 4.22–5.81)
RDW: 13.9 % (ref 11.5–15.5)
WBC: 9.7 10*3/uL (ref 4.0–10.5)

## 2017-07-13 MED ORDER — OXYCODONE HCL 5 MG PO TABS
30.0000 mg | ORAL_TABLET | Freq: Three times a day (TID) | ORAL | Status: DC | PRN
  Administered 2017-07-13 – 2017-07-16 (×8): 30 mg via ORAL
  Filled 2017-07-13 (×9): qty 6

## 2017-07-13 MED ORDER — ENOXAPARIN SODIUM 40 MG/0.4ML ~~LOC~~ SOLN
40.0000 mg | SUBCUTANEOUS | Status: DC
  Administered 2017-07-13 – 2017-07-16 (×4): 40 mg via SUBCUTANEOUS
  Filled 2017-07-13 (×4): qty 0.4

## 2017-07-13 MED ORDER — ASPIRIN EC 81 MG PO TBEC
81.0000 mg | DELAYED_RELEASE_TABLET | Freq: Every day | ORAL | Status: DC
Start: 2017-07-13 — End: 2017-07-16
  Administered 2017-07-13 – 2017-07-16 (×4): 81 mg via ORAL
  Filled 2017-07-13 (×4): qty 1

## 2017-07-13 MED ORDER — PRAVASTATIN SODIUM 10 MG PO TABS
20.0000 mg | ORAL_TABLET | Freq: Every day | ORAL | Status: DC
  Administered 2017-07-13 – 2017-07-15 (×3): 20 mg via ORAL
  Filled 2017-07-13 (×3): qty 2

## 2017-07-13 MED ORDER — DOCUSATE SODIUM 100 MG PO CAPS
200.0000 mg | ORAL_CAPSULE | Freq: Two times a day (BID) | ORAL | Status: DC
  Administered 2017-07-13 – 2017-07-16 (×7): 200 mg via ORAL
  Filled 2017-07-13 (×7): qty 2

## 2017-07-13 MED ORDER — METHYLPREDNISOLONE SODIUM SUCC 125 MG IJ SOLR
80.0000 mg | Freq: Four times a day (QID) | INTRAMUSCULAR | Status: DC
  Administered 2017-07-13 – 2017-07-15 (×8): 80 mg via INTRAVENOUS
  Filled 2017-07-13 (×8): qty 2

## 2017-07-13 MED ORDER — METHYLNALTREXONE BROMIDE 12 MG/0.6ML ~~LOC~~ SOLN
12.0000 mg | Freq: Once | SUBCUTANEOUS | Status: AC
Start: 2017-07-13 — End: 2017-07-13
  Administered 2017-07-13: 12 mg via SUBCUTANEOUS
  Filled 2017-07-13: qty 0.6

## 2017-07-13 MED ORDER — METHYLNALTREXONE BROMIDE 12 MG/0.6ML ~~LOC~~ SOLN
12.0000 mg | Freq: Once | SUBCUTANEOUS | Status: AC
  Administered 2017-07-13: 12 mg via SUBCUTANEOUS
  Filled 2017-07-13: qty 0.6

## 2017-07-13 MED ORDER — PANTOPRAZOLE SODIUM 40 MG PO TBEC
40.0000 mg | DELAYED_RELEASE_TABLET | Freq: Every day | ORAL | Status: DC
  Administered 2017-07-13 – 2017-07-16 (×4): 40 mg via ORAL
  Filled 2017-07-13 (×4): qty 1

## 2017-07-13 MED ORDER — DOXYCYCLINE HYCLATE 100 MG PO TABS
100.0000 mg | ORAL_TABLET | Freq: Two times a day (BID) | ORAL | Status: DC
  Administered 2017-07-13 – 2017-07-16 (×8): 100 mg via ORAL
  Filled 2017-07-13 (×8): qty 1

## 2017-07-13 NOTE — Progress Notes (Signed)
Radiology also updated about plan for sedation tomorrow prior to CT.
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
## 2017-07-13 NOTE — Progress Notes (Signed)
PROGRESS NOTE    Jerry [name]  HER:[id number] DOB: 03-29-1951 DOA: 07/12/2017 PCP: Aletha Halim., PA-C   Specialists:     Brief Narrative:  75 male Severe LBP s/p spinal cord stimulator htn hld Cad Chr diastolic hf and PNa 8/563 Bipolar gerd Body mass index is 38.86 kg/m. S/p chest wall resection 05/07/15 + reconstruction surgery repaired this area susbsequently Underwent recent Ct of chest  Assessment & Plan:   Active Problems:   Ileus (HCC)  Severe LBP and inability to lift legs 2/2 to pain Unclear cause- states that since the chest CT performed as follow up for his Osteosarcoma was done, he has had severe pain with inability to really move his lower extremities ED Md did not feel he had cauda equina on exam--he has good sensation but he does have diminished power   get stat MRI bck -need to be under aneasthesia   Ileus repeat dose of relistor Cannot get CT abd as cannot lay flat-rpt abd films if no improvement in am  -Acute on chronic low back pain:  intrathecal pump that provides continuous basal dosing intrathecally, followed by neurosurgery team in [facility].   continue oxycodone 30 mg po q 8 hrs as needed, if needed can provide IV dose for breakthrough pain  -CAD: no active CP at this time, continue ASA + statin  -Diastolic heart failure, chronic: does not appear to be volume overloaded; holding home furosemide + potassium supplement at this time  -GERD: continue PPI  -Obesity: noted, closely monitor for sedation in setting of opioid use; however, he is not opioid naive  -Hx of osteosarcoma of R chest wall: CT scan does not suggest any evidence of disease recurrence     DVT prophylaxis: lovenox Code Status:  full Family Communication:  None  Disposition Plan: inpatient   Consultants:   none  Procedures:   none  Antimicrobials:       Subjective:  In severe bilateral LE pain Cannot ift legs up off bed whereas usually is  able to do most ADL's himself No stool but passing gas Usually does have good movement of his bowels with laxatives No cp  Objective: Vitals:   07/12/17 2330 07/12/17 2345 07/13/17 0058 07/13/17 1305  BP:  (!) 131/55 (!) 148/72 (!) 141/61  Pulse:  64 61 67  Resp:  17 19 18   Temp:   98.4 F (36.9 C) 98.2 F (36.8 C)  TempSrc:   Oral Oral  SpO2: 98%  99% 99%  Weight:   133.6 kg (294 lb 8.6 oz)   Height:   6\' 1"  (1.854 m)     Intake/Output Summary (Last 24 hours) at 07/13/2017 1327 Last data filed at 07/13/2017 1117 Gross per 24 hour  Intake 54.5 ml  Output 3775 ml  Net -3720.5 ml   Filed Weights   07/12/17 1853 07/13/17 0058  Weight: 133.4 kg (294 lb) 133.6 kg (294 lb 8.6 oz)    Examination:  eomi ncat No pallor no ict cta b abd soft nt-bs + Did not examine rectal area nocp nor sob Power LE's is 1/5 bilaterally with severe pain nmoted on passive movement Reflexes equivocal  Data Reviewed: I have personally reviewed following labs and imaging studies  CBC: Recent Labs  Lab 07/12/17 1917 07/13/17 0441  WBC 8.9 9.7  NEUTROABS 6.9  --   HGB 13.3 12.5*  HCT 41.1 38.5*  MCV 93.8 92.5  PLT 193 149   Basic Metabolic Panel: Recent Labs  Lab 07/11/17 0829 07/12/17 1917 07/13/17 0441  NA 136 135 135  K 3.4* 3.9 3.2*  CL 93* 92* 95*  CO2 31 31 30   GLUCOSE 130* 129* 106*  BUN 7 5* 5*  CREATININE 0.98 1.02 0.87  CALCIUM 9.3 9.6 9.0   GFR: Estimated Creatinine Clearance: 119.8 mL/min (by C-G formula based on SCr of 0.87 mg/dL). Liver Function Tests: Recent Labs  Lab 07/12/17 1917  AST 35  ALT 31  ALKPHOS 59  BILITOT 0.7  PROT 7.9  ALBUMIN 3.9   No results for input(s): LIPASE, AMYLASE in the last 168 hours. No results for input(s): AMMONIA in the last 168 hours. Coagulation Profile: No results for input(s): INR, PROTIME in the last 168 hours. Cardiac Enzymes: No results for input(s): CKTOTAL, CKMB, CKMBINDEX, TROPONINI in the last 168 hours. BNP  (last 3 results) No results for input(s): PROBNP in the last 8760 hours. HbA1C: No results for input(s): HGBA1C in the last 72 hours. CBG: No results for input(s): GLUCAP in the last 168 hours. Lipid Profile: No results for input(s): CHOL, HDL, LDLCALC, TRIG, CHOLHDL, LDLDIRECT in the last 72 hours. Thyroid Function Tests: No results for input(s): TSH, T4TOTAL, FREET4, T3FREE, THYROIDAB in the last 72 hours. Anemia Panel: No results for input(s): VITAMINB12, FOLATE, FERRITIN, TIBC, IRON, RETICCTPCT in the last 72 hours. Urine analysis:    Component Value Date/Time   COLORURINE YELLOW 07/13/2017 0315   APPEARANCEUR CLEAR 07/13/2017 0315   LABSPEC 1.012 07/13/2017 0315   PHURINE 8.0 07/13/2017 0315   GLUCOSEU NEGATIVE 07/13/2017 0315   HGBUR NEGATIVE 07/13/2017 0315   BILIRUBINUR NEGATIVE 07/13/2017 0315   KETONESUR NEGATIVE 07/13/2017 0315   PROTEINUR NEGATIVE 07/13/2017 0315   UROBILINOGEN 1.0 06/10/2015 1106   NITRITE NEGATIVE 07/13/2017 0315   LEUKOCYTESUR NEGATIVE 07/13/2017 0315     Radiology Studies: Reviewed images personally in health database    Scheduled Meds: . aspirin EC  81 mg Oral Daily  . docusate sodium  200 mg Oral BID  . doxycycline  100 mg Oral Q12H  . enoxaparin (LOVENOX) injection  40 mg Subcutaneous Q24H  . pantoprazole  40 mg Oral Daily  . pravastatin  20 mg Oral q1800   Continuous Infusions: . sodium chloride 10 mL/hr at 07/12/17 2133     LOS: 0 days    Time spent: [place], MD [facility]   If 7PM-7AM, please contact night-coverage [email or web address] Password [email or web address] 07/13/2017, 1:27 PM

## 2017-07-13 NOTE — Progress Notes (Signed)
Spoke with anesthesia (Dr. Ola Spurr ) about CT order under anesthesia.  Procedure can't be done today but will be booked for tomorrow 12/9.  Patient will need to be NPO at least 8 hours prior to procedure.  Dr. Verlon Au made aware .

## 2017-07-14 ENCOUNTER — Inpatient Hospital Stay (HOSPITAL_COMMUNITY): Payer: Medicare HMO | Admitting: Anesthesiology

## 2017-07-14 ENCOUNTER — Inpatient Hospital Stay (HOSPITAL_COMMUNITY): Payer: Medicare HMO

## 2017-07-14 ENCOUNTER — Encounter (HOSPITAL_COMMUNITY): Payer: Self-pay | Admitting: Radiology

## 2017-07-14 ENCOUNTER — Encounter (HOSPITAL_COMMUNITY)
Admission: EM | Disposition: A | Discharge status: Home Health | Payer: Self-pay | Source: Home / Self Care | Attending: Family Medicine

## 2017-07-14 HISTORY — PX: RADIOLOGY WITH ANESTHESIA: SHX6223

## 2017-07-14 SURGERY — CT WITH ANESTHESIA
Anesthesia: Monitor Anesthesia Care

## 2017-07-14 MED ORDER — FENTANYL CITRATE (PF) 100 MCG/2ML IJ SOLN
INTRAMUSCULAR | Status: DC | PRN
  Administered 2017-07-14 (×2): 50 ug via INTRAVENOUS

## 2017-07-14 MED ORDER — FLEET ENEMA 7-19 GM/118ML RE ENEM
1.0000 | ENEMA | Freq: Once | RECTAL | Status: AC
  Administered 2017-07-14: 1 via RECTAL
  Filled 2017-07-14: qty 1

## 2017-07-14 MED ORDER — POLYETHYLENE GLYCOL 3350 17 G PO PACK
17.0000 g | PACK | Freq: Every day | ORAL | Status: DC
Start: 2017-07-14 — End: 2017-07-16
  Administered 2017-07-14 – 2017-07-16 (×3): 17 g via ORAL
  Filled 2017-07-14 (×2): qty 1

## 2017-07-14 MED ORDER — MIDAZOLAM HCL 5 MG/5ML IJ SOLN
INTRAMUSCULAR | Status: DC | PRN
  Administered 2017-07-14: 2 mg via INTRAVENOUS

## 2017-07-14 MED ORDER — LACTATED RINGERS IV SOLN
INTRAVENOUS | Status: DC | PRN
  Administered 2017-07-14: 09:00:00 via INTRAVENOUS

## 2017-07-14 MED ORDER — IOPAMIDOL (ISOVUE-300) INJECTION 61%
INTRAVENOUS | Status: AC
  Administered 2017-07-14: 100 mL
  Filled 2017-07-14: qty 100

## 2017-07-14 MED ORDER — PROPOFOL 500 MG/50ML IV EMUL
INTRAVENOUS | Status: DC | PRN
  Administered 2017-07-14: 100 ug/kg/min via INTRAVENOUS

## 2017-07-14 MED ORDER — NALOXEGOL OXALATE 12.5 MG PO TABS
12.5000 mg | ORAL_TABLET | Freq: Every day | ORAL | Status: DC
  Administered 2017-07-14 – 2017-07-16 (×3): 12.5 mg via ORAL
  Filled 2017-07-14 (×3): qty 1

## 2017-07-14 NOTE — Transfer of Care (Signed)
Immediate Anesthesia Transfer of Care Note  Patient: Jerry [name]  Procedure(s) Performed: CT WITH ANESTHESIA (N/A )  Patient Location: CT  Anesthesia Type:MAC  Level of Consciousness: awake, alert  and oriented  Airway & Oxygen Therapy: Patient Spontanous Breathing  Post-op Assessment: Report given to RN and Post -op Vital signs reviewed and stable  Post vital signs: Reviewed and stable  Last Vitals:  Vitals:   07/13/17 2207 07/14/17 0600  BP: (!) 164/67 140/68  Pulse: 64 68  Resp: 18   Temp: 37.3 C 36.9 C  SpO2: 94% 99%    Last Pain:  Vitals:   07/14/17 0835  TempSrc:   PainSc: 5       Patients Stated Pain Goal: 4 (20/94/70 9628)  Complications: No apparent anesthesia complications

## 2017-07-14 NOTE — Anesthesia Preprocedure Evaluation (Addendum)
Anesthesia Evaluation  Patient identified by MRN, date of birth, ID band Patient awake    Reviewed: Allergy & Precautions, H&P , NPO status , Patient's Chart, lab work & pertinent test results  History of Anesthesia Complications Negative for: history of anesthetic complications  Airway Mallampati: II  TM Distance: >3 FB Neck ROM: full    Dental  (+) Dental Advisory Given   Pulmonary shortness of breath, former smoker,    breath sounds clear to auscultation       Cardiovascular hypertension, Pt. on medications + CAD and +CHF   Rhythm:regular Rate:Normal  TTE (08/2016): EF 55%   Neuro/Psych PSYCHIATRIC DISORDERS Anxiety Depression  Neuromuscular disease    GI/Hepatic GERD  Medicated,  Endo/Other  Morbid obesity  Renal/GU      Musculoskeletal  (+) Arthritis ,   Abdominal   Peds  Hematology  (+) anemia ,   Anesthesia Other Findings   Reproductive/Obstetrics                            Anesthesia Physical Anesthesia Plan  ASA: III  Anesthesia Plan: MAC   Post-op Pain Management:    Induction: Intravenous  PONV Risk Score and Plan: 1 and Treatment may vary due to age or medical condition  Airway Management Planned: Nasal Cannula  Additional Equipment: None  Intra-op Plan:   Post-operative Plan:   Informed Consent: I have reviewed the patients History and Physical, chart, labs and discussed the procedure including the risks, benefits and alternatives for the proposed anesthesia with the patient or authorized representative who has indicated his/her understanding and acceptance.   Dental advisory given  Plan Discussed with: CRNA  Anesthesia Plan Comments:         Anesthesia Quick Evaluation

## 2017-07-14 NOTE — Progress Notes (Signed)
PROGRESS NOTE    Jerry [name]  IWP:[id number] DOB: 03/20/1951 DOA: 07/12/2017 PCP: Aletha Halim., PA-C   Specialists:     Brief Narrative:  37 male Severe LBP s/p spinal cord stimulator htn hld Cad Chr diastolic hf and PNa 5/053 Bipolar gerd Body mass index is 38.86 kg/m. S/p chest wall resection 05/07/15 + reconstruction surgery repaired this area susbsequently Underwent recent Ct of chest  Assessment & Plan:   Active Problems:   Ileus (HCC)  Severe LBP and inability to lift legs 2/2 to pain Unclear cause- states that since the chest CT performed as follow up for his Osteosarcoma was done, he has had severe pain with inability to really move his lower extremities CT lower back non-specific-d/w Dr. Saintclair Halsted NS who recommends after my discussion with him Steroid dose taper on d/c once other issues resolve and follow up Dr. Ermalene Postin as OP  Ileus repeat dose of relistor x 1 12/8 Adding Miralax and Fleets enema Only mod stool in colon  Acute on chronic low back pain:  intrathecal pump that provides continuous basal dosing intrathecally, followed by neurosurgery team in [facility], [place].   continue oxycodone 30 mg po q 8 hrs as needed, if needed can provide IV dose for breakthrough pain  CAD: no active CP at this time, continue ASA + statin  -Diastolic heart failure, chronic: does not appear to be volume overloaded; holding home furosemide + potassium supplement at this time  -GERD: continue PPI  -Obesity: noted, closely monitor for sedation in setting of opioid use; however, he is not opioid naive  -Hx of osteosarcoma of R chest wall: CT scan does not suggest any evidence of disease recurrence     DVT prophylaxis: lovenox Code Status:  full Family Communication:  None  Disposition Plan: inpatient   Consultants:   none  Procedures:   none  Antimicrobials:       Subjective:  Fair  No distress eating and drinking No cp n/v able to lift legs  now  Objective: Vitals:   07/13/17 0058 07/13/17 1305 07/13/17 2207 07/14/17 0600  BP: (!) 148/72 (!) 141/61 (!) 164/67 140/68  Pulse: 61 67 64 68  Resp: 19 18 18    Temp: 98.4 F (36.9 C) 98.2 F (36.8 C) 99.1 F (37.3 C) 98.5 F (36.9 C)  TempSrc: Oral Oral Oral Oral  SpO2: 99% 99% 94% 99%  Weight: 133.6 kg (294 lb 8.6 oz)     Height: 6\' 1"  (1.854 m)       Intake/Output Summary (Last 24 hours) at 07/14/2017 1517 Last data filed at 07/14/2017 1200 Gross per 24 hour  Intake 440 ml  Output 2350 ml  Net -1910 ml   Filed Weights   07/12/17 1853 07/13/17 0058  Weight: 133.4 kg (294 lb) 133.6 kg (294 lb 8.6 oz)    Examination:  eomi ncat No pallor no ict cta b abd soft nt-bs + Did not examine rectal area nocp nor sob Power LE's now 5/5 and able to resist anti-gravity postures Reflexes equivocal  Data Reviewed: I have personally reviewed following labs and imaging studies  CBC: Recent Labs  Lab 07/12/17 1917 07/13/17 0441  WBC 8.9 9.7  NEUTROABS 6.9  --   HGB 13.3 12.5*  HCT 41.1 38.5*  MCV 93.8 92.5  PLT 193 976   Basic Metabolic Panel: Recent Labs  Lab 07/11/17 0829 07/12/17 1917 07/13/17 0441  NA 136 135 135  K 3.4* 3.9 3.2*  CL 93* 92*  95*  CO2 31 31 30   GLUCOSE 130* 129* 106*  BUN 7 5* 5*  CREATININE 0.98 1.02 0.87  CALCIUM 9.3 9.6 9.0   GFR: Estimated Creatinine Clearance: 119.8 mL/min (by C-G formula based on SCr of 0.87 mg/dL). Liver Function Tests: Recent Labs  Lab 07/12/17 1917  AST 35  ALT 31  ALKPHOS 59  BILITOT 0.7  PROT 7.9  ALBUMIN 3.9   No results for input(s): LIPASE, AMYLASE in the last 168 hours. No results for input(s): AMMONIA in the last 168 hours. Coagulation Profile: No results for input(s): INR, PROTIME in the last 168 hours. Cardiac Enzymes: No results for input(s): CKTOTAL, CKMB, CKMBINDEX, TROPONINI in the last 168 hours. BNP (last 3 results) No results for input(s): PROBNP in the last 8760  hours. HbA1C: No results for input(s): HGBA1C in the last 72 hours. CBG: No results for input(s): GLUCAP in the last 168 hours. Lipid Profile: No results for input(s): CHOL, HDL, LDLCALC, TRIG, CHOLHDL, LDLDIRECT in the last 72 hours. Thyroid Function Tests: No results for input(s): TSH, T4TOTAL, FREET4, T3FREE, THYROIDAB in the last 72 hours. Anemia Panel: No results for input(s): VITAMINB12, FOLATE, FERRITIN, TIBC, IRON, RETICCTPCT in the last 72 hours. Urine analysis:    Component Value Date/Time   COLORURINE YELLOW 07/13/2017 0315   APPEARANCEUR CLEAR 07/13/2017 0315   LABSPEC 1.012 07/13/2017 0315   PHURINE 8.0 07/13/2017 0315   GLUCOSEU NEGATIVE 07/13/2017 0315   HGBUR NEGATIVE 07/13/2017 0315   BILIRUBINUR NEGATIVE 07/13/2017 0315   KETONESUR NEGATIVE 07/13/2017 0315   PROTEINUR NEGATIVE 07/13/2017 0315   UROBILINOGEN 1.0 06/10/2015 1106   NITRITE NEGATIVE 07/13/2017 0315   LEUKOCYTESUR NEGATIVE 07/13/2017 0315     Radiology Studies: Reviewed images personally in health database    Scheduled Meds: . aspirin EC  81 mg Oral Daily  . docusate sodium  200 mg Oral BID  . doxycycline  100 mg Oral Q12H  . enoxaparin (LOVENOX) injection  40 mg Subcutaneous Q24H  . methylPREDNISolone (SOLU-MEDROL) injection  80 mg Intravenous Q6H  . naloxegol oxalate  12.5 mg Oral Daily  . pantoprazole  40 mg Oral Daily  . polyethylene glycol  17 g Oral Daily  . pravastatin  20 mg Oral q1800   Continuous Infusions: . sodium chloride 10 mL/hr at 07/12/17 2133     LOS: 1 day    Time spent: [place], MD [facility]   If 7PM-7AM, please contact night-coverage [email or web address] Password [email or web address] 07/14/2017, 3:17 PM

## 2017-07-14 NOTE — Progress Notes (Signed)
Pt had 2 loose bowel movements this pm,reports significant relief

## 2017-07-15 ENCOUNTER — Encounter (HOSPITAL_COMMUNITY): Payer: Self-pay | Admitting: Radiology

## 2017-07-15 DIAGNOSIS — M545 Low back pain: Secondary | ICD-10-CM

## 2017-07-15 MED ORDER — FUROSEMIDE 20 MG PO TABS
20.0000 mg | ORAL_TABLET | Freq: Every day | ORAL | Status: DC
  Administered 2017-07-16: 20 mg via ORAL
  Filled 2017-07-15: qty 1

## 2017-07-15 MED ORDER — PREDNISONE 10 MG (21) PO TBPK: 20.0000 mg | ORAL_TABLET | Freq: Every evening | ORAL | Status: DC

## 2017-07-15 MED ORDER — PREDNISONE 10 MG (21) PO TBPK: 20.0000 mg | ORAL_TABLET | Freq: Every morning | ORAL | Status: DC

## 2017-07-15 MED ORDER — PREDNISONE 10 MG (21) PO TBPK: 10.0000 mg | ORAL_TABLET | ORAL | Status: DC

## 2017-07-15 MED ORDER — PREDNISONE 10 MG (21) PO TBPK: 10.0000 mg | ORAL_TABLET | Freq: Three times a day (TID) | ORAL | Status: DC

## 2017-07-15 MED ORDER — FUROSEMIDE 20 MG PO TABS
10.0000 mg | ORAL_TABLET | Freq: Every evening | ORAL | Status: DC
  Administered 2017-07-15: 10 mg via ORAL
  Filled 2017-07-15: qty 1

## 2017-07-15 MED ORDER — PREDNISONE 10 MG (21) PO TBPK: 10.0000 mg | ORAL_TABLET | Freq: Four times a day (QID) | ORAL | Status: DC

## 2017-07-15 MED ORDER — PREDNISONE 10 MG (21) PO TBPK
20.0000 mg | ORAL_TABLET | Freq: Every morning | ORAL | Status: AC
  Administered 2017-07-16: 20 mg via ORAL
  Filled 2017-07-15: qty 21

## 2017-07-15 MED ORDER — PREDNISONE 10 MG (21) PO TBPK
10.0000 mg | ORAL_TABLET | ORAL | Status: AC
  Administered 2017-07-16: 10 mg via ORAL

## 2017-07-15 NOTE — Evaluation (Signed)
Physical Therapy Evaluation Patient Details Name: Jerry [name] MRN: [id number] DOB: 12/13/1950 Today's Date: 07/15/2017   History of Present Illness  66yo male who was admitted with back pain and constipation, with progressive pain and LE weakness. 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   Clinical Impression   Patient received in bed with HOB elevated, pleasant and willing to work with skilled PT services. He reports a history significant for falling as well as chronic pain, and also states that his LE weakness appears to have improved since he received dosage of steroids earlier today. Patient able to complete all functional mobility and transfers with S to Min guard, able to ambulate approximately 40ft in hallway Min guard with 2 wheeled walker but limited by fatigue and muscle weakness. Patient appears appropriate for HHPT moving forward, as he seems well equipped and supported by family at home, and is agreeable to this plan. He declines up to chair this afternoon. Patient left in bed with HOB elevated, all questions and concerns addressed, and all needs met.     Follow Up Recommendations Home health PT    Equipment Recommendations  None recommended by PT    Recommendations for Other Services       Precautions / Restrictions Precautions Precautions: Fall Restrictions Weight Bearing Restrictions: No      Mobility  Bed Mobility Overal bed mobility: Needs Assistance Bed Mobility: Supine to Sit;Sit to Supine     Supine to sit: Supervision Sit to supine: Min guard      Transfers Overall transfer level: Needs assistance Equipment used: Rolling walker (2 wheeled) Transfers: Sit to/from Stand Sit to Stand: Min guard            Ambulation/Gait Ambulation/Gait assistance: Min guard Ambulation Distance (Feet): 40 Feet Assistive device: Rolling walker (2  wheeled) Gait Pattern/deviations: Step-through pattern;Decreased step length - right;Decreased step length - left;Decreased dorsiflexion - left;Decreased dorsiflexion - right;Trunk flexed     General Gait Details: giat distance limited by fatigue   Stairs            Wheelchair Mobility    Modified Rankin (Stroke Patients Only)       Balance Overall balance assessment: History of Falls;Needs assistance Sitting-balance support: No upper extremity supported Sitting balance-Leahy Scale: Good     Standing balance support: Bilateral upper extremity supported Standing balance-Leahy Scale: Fair                               Pertinent Vitals/Pain Pain Assessment: 0-10 Pain Score: 4  Pain Location: low back  Pain Descriptors / Indicators: Aching Pain Intervention(s): Limited activity within patient's tolerance;Monitored during session    Home Living Family/patient expects to be discharged to:: Private residence Living Arrangements: Children;Non-relatives/Friends Available Help at Discharge: Family;Available 24 hours/day Type of Home: House Home Access: Stairs to enter Entrance Stairs-Rails: Right;Left Entrance Stairs-Number of Steps: 4 Home Layout: One level Home Equipment: Walker - standard;Cane - single point;Hand held shower head;Grab bars - tub/shower;Bedside commode Additional Comments: pt lives with his son and son's wife     Prior Function Level of Independence: Independent with assistive device(s)         Comments: reports using cane for ambulation PTA     Hand Dominance   Dominant Hand: Right    Extremity/Trunk Assessment   Upper Extremity Assessment Upper Extremity   Assessment: Overall WFL for tasks assessed    Lower Extremity Assessment Lower Extremity Assessment: Generalized weakness    Cervical / Trunk Assessment Cervical / Trunk Assessment: Kyphotic  Communication   Communication: No difficulties  Cognition Arousal/Alertness:  Awake/alert Behavior During Therapy: WFL for tasks assessed/performed Overall Cognitive Status: Within Functional Limits for tasks assessed                                        General Comments      Exercises     Assessment/Plan    PT Assessment Patient needs continued PT services  PT Problem List Decreased strength;Decreased mobility;Decreased coordination;Decreased activity tolerance;Decreased balance       PT Treatment Interventions Therapeutic activities;Gait training;Therapeutic exercise;Patient/family education;Balance training;Functional mobility training;Neuromuscular re-education    PT Goals (Current goals can be found in the Care Plan section)  Acute Rehab PT Goals Patient Stated Goal: to go home  PT Goal Formulation: With patient Time For Goal Achievement: 07/22/17 Potential to Achieve Goals: Good    Frequency Min 3X/week   Barriers to discharge        Co-evaluation               AM-PAC PT "6 Clicks" Daily Activity  Outcome Measure Difficulty turning over in bed (including adjusting bedclothes, sheets and blankets)?: None Difficulty moving from lying on back to sitting on the side of the bed? : None Difficulty sitting down on and standing up from a chair with arms (e.g., wheelchair, bedside commode, etc,.)?: None Help needed moving to and from a bed to chair (including a wheelchair)?: A Little Help needed walking in hospital room?: A Little Help needed climbing 3-5 steps with a railing? : A Lot 6 Click Score: 20    End of Session Equipment Utilized During Treatment: Gait belt Activity Tolerance: Patient tolerated treatment well;Patient limited by fatigue Patient left: in bed;with call bell/phone within reach   PT Visit Diagnosis: Muscle weakness (generalized) (M62.81);History of falling (Z91.81)    Time: 1501-1524 PT Time Calculation (min) (ACUTE ONLY): 23 min   Charges:   PT Evaluation $PT Eval Low Complexity: 1 Low PT  Treatments $Self Care/Home Management: 8-22   PT G Codes:   PT G-Codes **NOT FOR INPATIENT CLASS** Functional Assessment Tool Used: AM-PAC 6 Clicks Basic Mobility;Clinical judgement Functional Limitation: Mobility: Walking and moving around Mobility: Walking and Moving Around Current Status (G8978): At least 40 percent but less than 60 percent impaired, limited or restricted Mobility: Walking and Moving Around Goal Status (G8979): At least 20 percent but less than 40 percent impaired, limited or restricted     PT, DPT, CBIS  Supplemental Physical Therapist [place]     

## 2017-07-15 NOTE — H&P (Signed)
      [address]       [place] [address]             [phone]      Mr. Paradis is a 66 yo man with a history of a resection of a chest wall sarcoma. He has severe back pain and cannot lie flat for a CT without general anesthesia.  To have GA for CT today. F/u in office as scheduled  Remo Lipps C. Roxan Hockey, MD [facility] [phone]

## 2017-07-15 NOTE — Progress Notes (Addendum)
PROGRESS NOTE    Jerry [name]  OAC:[id number] DOB: April 12, 1951 DOA: 07/12/2017 PCP: Aletha Halim., PA-C   Specialists:     Brief Narrative:  45 male Severe LBP s/p spinal cord stimulator htn hld Cad Chr diastolic hf and PNa 0/109 Bipolar gerd Body mass index is 38.86 kg/m. S/p chest wall resection 05/07/15 + reconstruction surgery repaired this area susbsequently Underwent recent Ct of chest  Assessment & Plan:   Active Problems:   Ileus (HCC)  Severe LBP and inability to lift legs 2/2 to pain Unclear cause- states that since the chest CT performed as follow up for his Osteosarcoma was done,severe pain with inability to really move his lower extremities ---much improved after IV steroids started CT lower back non-specific-d/w Dr. Saintclair Halsted  12/9 NS who recommends after my discussion with him Steroid dose taper on d/c once other issues resolve and follow up Dr. Princess Perna management neurology] as OP Will start taper today and see if any discrnible change and maybe d/c home am as therapy feels approp for home  Ileus repeat dose of relistor x 1 12/8 Adding Miralax and Fleets enema Good result and resolved 12/10 and from this perspective stable for d/c home  Acute on chronic low back pain:  intrathecal pump that provides continuous basal dosing intrathecally, followed by neurosurgery team in [facility], [place].  continue oxycodone 30 mg po q 8 hrs as needed, if needed can provide IV dose for breakthrough pain  CAD: no active CP at this time, continue ASA + statin  -Diastolic heart failure, chronic: does not appear to be volume overloaded; holding home furosemide + potassium supplement at this time  -GERD: continue PPI  -Obesity: noted, closely monitor for sedation in setting of opioid use; however, he is not opioid naive  -Hx of osteosarcoma of R chest wall: CT scan does not suggest any evidence of disease recurrence     DVT prophylaxis: lovenox Code Status:   full Family Communication:  None  Disposition Plan: inpatient   Consultants:   none  Procedures:   none  Antimicrobials:       Subjective:  Awake alert doing ok passed x 2 stool Moving 4 limbs w/o deficits  Objective: Vitals:   07/14/17 0600 07/14/17 2248 07/15/17 0435 07/15/17 1314  BP: 140/68 (!) 112/54 (!) 158/69 (!) 147/68  Pulse: 68 60 (!) 58 69  Resp:  18 20 18   Temp: 98.5 F (36.9 C) 98.5 F (36.9 C) 97.8 F (36.6 C) 98.4 F (36.9 C)  TempSrc: Oral Oral Oral Oral  SpO2: 99% 94% 94% 96%  Weight:      Height:        Intake/Output Summary (Last 24 hours) at 07/15/2017 1603 Last data filed at 07/15/2017 1315 Gross per 24 hour  Intake 1296 ml  Output 1675 ml  Net -379 ml   Filed Weights   07/12/17 1853 07/13/17 0058  Weight: 133.4 kg (294 lb) 133.6 kg (294 lb 8.6 oz)    Examination:  eomi ncat No pallor cta b abd soft nt-bs + Did not examine rectal area No cp nor sob Power LE's now 5/5  Reflexes equivocal  Data Reviewed: I have personally reviewed following labs and imaging studies  CBC: Recent Labs  Lab 07/12/17 1917 07/13/17 0441  WBC 8.9 9.7  NEUTROABS 6.9  --   HGB 13.3 12.5*  HCT 41.1 38.5*  MCV 93.8 92.5  PLT 193 323   Basic Metabolic Panel: Recent Labs  Lab  07/11/17 0829 07/12/17 1917 07/13/17 0441  NA 136 135 135  K 3.4* 3.9 3.2*  CL 93* 92* 95*  CO2 31 31 30   GLUCOSE 130* 129* 106*  BUN 7 5* 5*  CREATININE 0.98 1.02 0.87  CALCIUM 9.3 9.6 9.0   GFR: Estimated Creatinine Clearance: 119.8 mL/min (by C-G formula based on SCr of 0.87 mg/dL). Liver Function Tests: Recent Labs  Lab 07/12/17 1917  AST 35  ALT 31  ALKPHOS 59  BILITOT 0.7  PROT 7.9  ALBUMIN 3.9   No results for input(s): LIPASE, AMYLASE in the last 168 hours. No results for input(s): AMMONIA in the last 168 hours. Coagulation Profile: No results for input(s): INR, PROTIME in the last 168 hours. Cardiac Enzymes: No results for input(s):  CKTOTAL, CKMB, CKMBINDEX, TROPONINI in the last 168 hours. BNP (last 3 results) No results for input(s): PROBNP in the last 8760 hours. HbA1C: No results for input(s): HGBA1C in the last 72 hours. CBG: No results for input(s): GLUCAP in the last 168 hours. Lipid Profile: No results for input(s): CHOL, HDL, LDLCALC, TRIG, CHOLHDL, LDLDIRECT in the last 72 hours. Thyroid Function Tests: No results for input(s): TSH, T4TOTAL, FREET4, T3FREE, THYROIDAB in the last 72 hours. Anemia Panel: No results for input(s): VITAMINB12, FOLATE, FERRITIN, TIBC, IRON, RETICCTPCT in the last 72 hours. Urine analysis:    Component Value Date/Time   COLORURINE YELLOW 07/13/2017 0315   APPEARANCEUR CLEAR 07/13/2017 0315   LABSPEC 1.012 07/13/2017 0315   PHURINE 8.0 07/13/2017 0315   GLUCOSEU NEGATIVE 07/13/2017 0315   HGBUR NEGATIVE 07/13/2017 0315   BILIRUBINUR NEGATIVE 07/13/2017 0315   KETONESUR NEGATIVE 07/13/2017 0315   PROTEINUR NEGATIVE 07/13/2017 0315   UROBILINOGEN 1.0 06/10/2015 1106   NITRITE NEGATIVE 07/13/2017 0315   LEUKOCYTESUR NEGATIVE 07/13/2017 0315     Radiology Studies: Reviewed images personally in health database    Scheduled Meds: . aspirin EC  81 mg Oral Daily  . docusate sodium  200 mg Oral BID  . doxycycline  100 mg Oral Q12H  . enoxaparin (LOVENOX) injection  40 mg Subcutaneous Q24H  . furosemide  10 mg Oral QPM  . [START ON 07/16/2017] furosemide  20 mg Oral Daily  . methylPREDNISolone (SOLU-MEDROL) injection  80 mg Intravenous Q6H  . naloxegol oxalate  12.5 mg Oral Daily  . pantoprazole  40 mg Oral Daily  . polyethylene glycol  17 g Oral Daily  . pravastatin  20 mg Oral q1800   Continuous Infusions: . sodium chloride 10 mL/hr at 07/12/17 2133     LOS: 2 days    Time spent: [place], MD [facility]   If 7PM-7AM, please contact night-coverage [email or web address] Password [email or web address] 07/15/2017, 4:03 PM

## 2017-07-16 ENCOUNTER — Ambulatory Visit: Payer: Medicare HMO | Admitting: Thoracic Surgery (Cardiothoracic Vascular Surgery)

## 2017-07-16 MED ORDER — ALUM & MAG HYDROXIDE-SIMETH 200-200-20 MG/5ML PO SUSP
30.0000 mL | ORAL | Status: DC | PRN
  Administered 2017-07-16: 30 mL via ORAL
  Filled 2017-07-16: qty 30

## 2017-07-16 MED ORDER — METHYLPREDNISOLONE 8 MG PO TABS: ORAL_TABLET | ORAL | 0 refills | Status: AC

## 2017-07-16 NOTE — Progress Notes (Signed)
Tech offered to get Pt into chair using the sara steady, Pt refused and stated that Physical therapy and the doctor had written a order that Pt only gets up with Physical therapy. Tech notified RN, Rn stated that she will check.

## 2017-07-16 NOTE — Care Management Note (Signed)
Case Management Note  Patient Details  Name: Jerry [name] MRN: [id number] Date of Birth: 11-15-1950  Subjective/Objective:                    Action/Plan:  Patient from home with family. Has had [facility] in past would like them again for HHPT. Patient already has walker and cane at home.  Expected Discharge Date:                  Expected Discharge Plan:  [place]  In-House Referral:     Discharge planning Services  CM Consult  Post Acute Care Choice:  Home Health Choice offered to:  Patient  DME Arranged:  N/A DME Agency:  NA  HH Arranged:  PT [place] Agency:  [facility]  Status of Service:  Completed, signed off  If discussed at [place] of Stay Meetings, dates discussed:    Additional Comments:  Marilu Favre, RN 07/16/2017, 1:24 PM

## 2017-07-16 NOTE — Progress Notes (Signed)
1810 Patient discharged to home. Verbalizes understanding of all discharge instructions including discharge medications.  Patient accompanied by his son.

## 2017-07-16 NOTE — Anesthesia Postprocedure Evaluation (Signed)
Anesthesia Post Note  Patient: Jerry [name]  Procedure(s) Performed: CT WITH ANESTHESIA (N/A )     Patient location during evaluation: Radiology Anesthesia Type: MAC Level of consciousness: awake and alert Pain management: pain level controlled Vital Signs Assessment: post-procedure vital signs reviewed and stable Respiratory status: spontaneous breathing, nonlabored ventilation, respiratory function stable and patient connected to nasal cannula oxygen Cardiovascular status: stable and blood pressure returned to baseline Postop Assessment: no apparent nausea or vomiting Anesthetic complications: no    Last Vitals:  Vitals:   07/15/17 2024 07/16/17 0442  BP: (!) 148/63 (!) 146/66  Pulse: (!) 59 60  Resp: 18 18  Temp: (!) 36.4 C 36.9 C  SpO2: 92% 98%    Last Pain:  Vitals:   07/16/17 0442  TempSrc: Oral  PainSc:                  Aydia Maj

## 2017-07-16 NOTE — Progress Notes (Signed)
**Note Jerry-Identified via Obfuscation** Physical Therapy Treatment Patient Details Name: Jerry [name] MRN: [id number] DOB: 10/18/1950 Today's Date: 07/16/2017    History of Present Illness 66yo male who was admitted with back pain and constipation, with progressive pain and LE weakness. 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     PT Comments    Continuing work on functional mobility and activity tolerance;  Noting very good progress, which I anticipate will continue with HHPT; OK for dc home from PT standpoint    Follow Up Recommendations  Home health PT     Equipment Recommendations  None recommended by PT    Recommendations for Other Services       Precautions / Restrictions Precautions Precautions: Fall    Mobility  Bed Mobility Overal bed mobility: Modified Independent Bed Mobility: Supine to Sit     Supine to sit: Modified independent (Device/Increase time)     General bed mobility comments: Used bed rails  Transfers Overall transfer level: Needs assistance Equipment used: Rolling walker (2 wheeled) Transfers: Sit to/from Stand Sit to Stand: Min guard(without physical contact)         General transfer comment: Cues for hand placement; did not need physical assist  Ambulation/Gait Ambulation/Gait assistance: Min guard Ambulation Distance (Feet): 100 Feet Assistive device: Rolling walker (2 wheeled) Gait Pattern/deviations: Step-through pattern;Decreased step length - right;Decreased step length - left;Decreased dorsiflexion - left;Decreased dorsiflexion - right;Trunk flexed     General Gait Details: Cues for upright posture and to self-monitor for activity tolerance   Stairs            Wheelchair Mobility    Modified Rankin (Stroke Patients Only)       Balance     Sitting balance-Leahy Scale: Good     Standing balance support: Bilateral upper extremity  supported Standing balance-Leahy Scale: Fair                              Cognition Arousal/Alertness: Awake/alert Behavior During Therapy: WFL for tasks assessed/performed Overall Cognitive Status: Within Functional Limits for tasks assessed                                        Exercises      General Comments General comments (skin integrity, edema, etc.): We discussed stair management and placing a chair near the entrance to his home, in case he needs a rest break immendiately after walking from car and ascending stairs; [name] will be present to assist      Pertinent Vitals/Pain Pain Assessment: Faces Faces Pain Scale: Hurts a little bit Pain Location: Bil feet Pain Descriptors / Indicators: Aching Pain Intervention(s): Monitored during session    Home Living                      Prior Function            PT Goals (current goals can now be found in the care plan section) Acute Rehab PT Goals Patient Stated Goal: to go home  PT Goal Formulation: With patient Time For Goal Achievement: 07/22/17 Potential to Achieve Goals: Good Progress towards PT goals: Progressing toward goals    Frequency    Min 3X/week      PT Plan Current plan  remains appropriate    Co-evaluation              AM-PAC PT "6 Clicks" Daily Activity  Outcome Measure  Difficulty turning over in bed (including adjusting bedclothes, sheets and blankets)?: None Difficulty moving from lying on back to sitting on the side of the bed? : None Difficulty sitting down on and standing up from a chair with arms (e.g., wheelchair, bedside commode, etc,.)?: None Help needed moving to and from a bed to chair (including a wheelchair)?: A Little Help needed walking in hospital room?: A Little Help needed climbing 3-5 steps with a railing? : A Little 6 Click Score: 21    End of Session Equipment Utilized During Treatment: Gait belt Activity Tolerance:  Patient tolerated treatment well;Patient limited by fatigue Patient left: in chair;with call bell/phone within reach Nurse Communication: Mobility status;Other (comment)(OK for dc from PT standpoint) PT Visit Diagnosis: Muscle weakness (generalized) (M62.81);History of falling (Z91.81)     Time: 5374-8270 PT Time Calculation (min) (ACUTE ONLY): 17 min  Charges:  $Gait Training: 8-22 mins                    G Codes:       Roney Marion, PT  Acute Rehabilitation Services Pager [phone] Office [place] 07/16/2017, 4:57 PM

## 2017-07-16 NOTE — Discharge Summary (Signed)
Physician Discharge Summary  Jerry [name] BTD:[id number] DOB: 02-18-1951 DOA: 07/12/2017  PCP: Aletha Halim., PA-C  Admit date: 07/12/2017 Discharge date: 07/16/2017  Time spent: 25 minutes  Recommendations for Outpatient Follow-up:  1. Need sOP Neurology/Neurosurgery Eval--Had some LE weakness which was not consistent with Cauda Equina but improved with steroids--does need taper of steroids which has been provided 2. Need bmet and cbc 1 week 3. Need Pain Management dr Creig Hines to refill controlled substances  Discharge Diagnoses:  Active Problems:   Ileus [facility])   Discharge Condition: improved  Diet recommendation: hh diabetic  Filed Weights   07/12/17 1853 07/13/17 0058  Weight: 133.4 kg (294 lb) 133.6 kg (294 lb 8.6 oz)    History of present illness: 74 male Severe LBP s/p spinal cord stimulator htn hld Cad Chr diastolic hf and PNa 1/062 Bipolar gerd Body mass index is 38.86 kg/m. S/p chest wall resection 05/07/15 + reconstruction surgery repaired this area susbsequently Underwent recent Ct of chest    Hospital Course:   Severe LBP and inability to lift legs 2/2 to pain Unclear cause- states that since the chest CT performed as follow up for his Osteosarcoma was done,severe pain with inability to really move his lower extremities ---much improved after IV steroids started CT lower back non-specific-d/w Dr. Saintclair Halsted  12/9 NS who recommends after my discussion with him Steroid dose taper on d/c once other issues resolve and follow up Dr. Princess Perna management neurology] as OP--might nee dhis primary surgeon to see him sooner Given steroid taper obn d/c   Ileus repeat dose of relistor x 1 12/8 Adding Miralax and Fleets enema Good result and resolved 12/10 and from this perspective stable for d/c home Has meds fro constipation at home   Acute on chronic low back pain:  intrathecal pump that provides continuous basal dosing intrathecally, followed by  neurosurgery team in [facility] [place]. --Refill scheduled 08/03/17 continue oxycodone 30 mg po q 8 hrs as needed   CAD: no active CP at this time, continue ASA + statin   -Diastolic heart failure, chronic: does not appear to be volume overloaded; holding home furosemide + potassium supplement at this time   -GERD: continue PPI   -Obesity: noted, closely monitor for sedation in setting of opioid use; however, he is not opioid naive   -Hx of osteosarcoma of R chest wall: CT scan does not suggest any evidence of disease recurrence   Procedures: Ct scan abd Consultations:  Neurosurgery Curbsided Dr Saintclair Halsted  Discharge Exam: Vitals:   07/16/17 0442 07/16/17 1338  BP: (!) 146/66 (!) 147/61  Pulse: 60 70  Resp: 18 18  Temp: 98.4 F (36.9 C) 98.4 F (36.9 C)  SpO2: 98% 94%    General: eopmi ncat Cardiovascular:  s1 s2 no M/R/G Respiratory:  abd soft nt nd  pwer 5/5  Eating drinkin moving around some No n/v No cp  Discharge Instructions    Allergies as of 07/16/2017      Reactions   Ciprofloxacin Anaphylaxis   Penicillins Rash   Has patient had a PCN reaction causing immediate rash, facial/tongue/throat swelling, SOB or lightheadedness with hypotension:unsure Has patient had a PCN reaction causing severe rash involving mucus membranes or skin necrosis:unsure Has patient had a PCN reaction that required hospitalization:unsure Has patient had a PCN reaction occurring within the last 10 years:NO If all of the above answers are "NO", then may proceed with Cephalosporin use.   Tramadol Hcl Rash  Medication List    STOP taking these medications   docusate sodium 100 MG capsule Commonly known as:  COLACE   doxycycline 100 MG tablet Commonly known as:  VIBRA-TABS   nitroGLYCERIN 0.4 MG SL tablet Commonly known as:  NITROSTAT     TAKE these medications   aspirin EC 81 MG tablet Take 81 mg by mouth daily.   budesonide-formoterol 160-4.5 MCG/ACT inhaler Commonly  known as:  SYMBICORT Inhale 2 puffs into the lungs daily.   calcium carbonate 500 MG chewable tablet Commonly known as:  TUMS - dosed in mg elemental calcium Chew 1 tablet by mouth daily as needed for indigestion or heartburn.   cholecalciferol 1000 units tablet Commonly known as:  VITAMIN D Take 1,000 Units by mouth daily.   doxazosin 1 MG tablet Commonly known as:  CARDURA Take 1 mg by mouth every evening.   ferrous sulfate 325 (65 FE) MG EC tablet Take 325 mg by mouth daily with breakfast.   furosemide 20 MG tablet Commonly known as:  LASIX Take 10-20 mg by mouth 2 (two) times daily. 20 mg in the am and 10 mg in the pm   ibuprofen 200 MG tablet Commonly known as:  ADVIL,MOTRIN Take 200 mg by mouth every 8 (eight) hours as needed for mild pain.   KLOR-CON M10 10 MEQ tablet Generic drug:  potassium chloride Take 20 mEq by mouth daily.   lovastatin 20 MG tablet Commonly known as:  MEVACOR Take 1 tablet (20 mg total) by mouth at bedtime.   methylPREDNISolone 8 MG tablet Commonly known as:  MEDROL Take 1 tablet (8 mg total) by mouth 2 (two) times daily for 7 days, THEN 0.5 tablets (4 mg total) 2 (two) times daily for 7 days, THEN 0.5 tablets (4 mg total) daily for 7 days. Start taking on:  07/16/2017   MOVANTIK 25 MG Tabs tablet Generic drug:  naloxegol oxalate Take 12.5 mg by mouth daily.   multivitamin with minerals Tabs tablet Take 1 tablet by mouth daily. FOR SENIORS   oxycodone 30 MG immediate release tablet Commonly known as:  ROXICODONE Take 30 mg by mouth 4 (four) times daily. For pain.   pantoprazole 40 MG tablet Commonly known as:  PROTONIX Take 1 tablet (40 mg total) by mouth 2 (two) times daily.   polyethylene glycol packet Commonly known as:  MIRALAX / GLYCOLAX Take 17 g by mouth daily.   promethazine 25 MG tablet Commonly known as:  PHENERGAN Take 25 mg by mouth daily as needed for nausea. For nausea.   senna-docusate 8.6-50 MG  tablet Commonly known as:  Senokot-S Take 2 tablets by mouth 2 (two) times daily.   traZODone 150 MG tablet Commonly known as:  DESYREL Take 150-300 mg by mouth at bedtime. Takes 1.5 tablets   vitamin C 500 MG tablet Commonly known as:  ASCORBIC ACID Take 500 mg by mouth daily.   zinc gluconate 50 MG tablet Take 50 mg by mouth daily.      Allergies  Allergen Reactions  . Ciprofloxacin Anaphylaxis  . Penicillins Rash    Has patient had a PCN reaction causing immediate rash, facial/tongue/throat swelling, SOB or lightheadedness with hypotension:unsure Has patient had a PCN reaction causing severe rash involving mucus membranes or skin necrosis:unsure Has patient had a PCN reaction that required hospitalization:unsure Has patient had a PCN reaction occurring within the last 10 years:NO If all of the above answers are "NO", then may proceed with Cephalosporin use.   Marland Kitchen  Tramadol Hcl Rash      The results of significant diagnostics from this hospitalization (including imaging, microbiology, ancillary and laboratory) are listed below for reference.    Significant Diagnostic Studies: Ct Chest Wo Contrast  Result Date: 07/11/2017 CLINICAL DATA:  Followup osteosarcoma of the chest. EXAM: CT CHEST WITHOUT CONTRAST TECHNIQUE: Multidetector CT imaging of the chest was performed following the standard protocol without IV contrast. COMPARISON:  05/22/2016 FINDINGS: Cardiovascular: Moderate cardiac enlargement. Aortic atherosclerosis noted. Calcification in the LAD and left circumflex and RCA coronary artery is noted. Mediastinum/Nodes: There is a endotracheal tube in place with tip above the carina. Unremarkable appearance of the esophagus. No mediastinal or hilar adenopathy. No axillary or supraclavicular adenopathy. Lungs/Pleura: Linear scar like densities noted within the right lung. Dependent changes identified within the posterior lung bases. Upper Abdomen: No acute abnormality.  Musculoskeletal: Degenerative disc disease is identified within the thoracic spine. Post operative changes from right anterior anterior aspect of the third fourth fifth 6 ribs noted. No residual osseous/soft tissue mass. IMPRESSION: 1. Stable postsurgical changes from right anterior thoracoplasty with resection of the anterior aspect of the third through sixth right ribs. No findings to suggest recurrent tumor or metastatic disease within the chest. 2. Aortic Atherosclerosis (ICD10-I70.0). Three vessel coronary artery calcifications noted. Bold Electronically Signed   By: Kerby Moors M.D.   On: 07/11/2017 12:03   Ct Abdomen Pelvis W Contrast  Result Date: 07/14/2017 CLINICAL DATA:  Diffuse abdominal pain and constipation over the last 2 weeks. History of osteosarcoma of the rib cage. EXAM: CT ABDOMEN AND PELVIS WITH CONTRAST TECHNIQUE: Multidetector CT imaging of the abdomen and pelvis was performed using the standard protocol following bolus administration of intravenous contrast. CONTRAST:  163mL ISOVUE-300 IOPAMIDOL (ISOVUE-300) INJECTION 61% COMPARISON:  No previous abdominal imaging. FINDINGS: Lower chest: Mild scarring. Mild dependent atelectasis and or infiltrate at the left base. No pleural effusion. The heart is enlarged. Aortic atherosclerosis and coronary artery calcification. Previous right anterior thoracoplasty. No acute complicating feature in that region. Hepatobiliary: No liver parenchymal lesion. Previous cholecystectomy. Pancreas: Fatty change of the pancreas. No focal lesion or inflammatory change. Spleen: Normal Adrenals/Urinary Tract: Adrenal glands are normal. Kidneys show some areas of atrophy and a 2 cm cyst in the left kidney. No hydronephrosis. Stomach/Bowel: No sign of bowel obstruction. Gas in the transverse colon, consistent with recumbency. Amount of fecal matter is within normal limits. Vascular/Lymphatic: Aortic atherosclerosis. No aneurysm. IVC is normal. No retroperitoneal  adenopathy. Reproductive: Normal Other: No free fluid or air. Musculoskeletal: See results of lumbar CT. No likely significant finding. IMPRESSION: No acute abdominal finding. There is gas in the transverse colon as can be seen with prolonged recumbent positioning. Amount of fecal matter appears within normal limits. Small bowel pattern is normal. No significant organ finding.  Previous cholecystectomy. Electronically Signed   By: Nelson Chimes M.D.   On: 07/14/2017 10:28   Ct L-spine No Charge  Result Date: 07/14/2017 CLINICAL DATA:  Chronic back pain with bilateral lower extremity pain in weakness. Difficulty walking. History of osteosarcoma of the ribs. EXAM: CT LUMBAR SPINE WITHOUT CONTRAST TECHNIQUE: Multidetector CT imaging of the lumbar spine was performed without intravenous contrast administration. Multiplanar CT image reconstructions were also generated. COMPARISON:  None. FINDINGS: Segmentation: 5 lumbar type vertebral bodies. Alignment: Straightening of the normal lumbar lordosis. Vertebrae: Old healed compression deformity at L2 with loss of height of 1/3. No retropulsed bone. Previous posterior decompression and fusion L3 to the sacrum. Fusion appears  solid. No complicating feature in that segment. Paraspinal and other soft tissues: Neuro stimulators in place. One lead enters at the L4 level, apparently passing through the thecal sac. Second lead enters at the T12-L1 interlaminar space. At the upper level of the scan, both leads are present within the left side of the spinal canal. There is some renal atrophy and a left renal cyst, not primarily or completely evaluated. There is aortic atherosclerosis but no aneurysm. Disc levels: T12-L1:  No stenosis. L1-2: Endplate osteophytes. Mild facet hypertrophy. No significant stenosis. L2-3: Disc bulge. Facet and ligamentous hypertrophy with ligamentous calcification. Mild multifactorial stenosis but no apparent neural compression. L3 to sacrum: Previous  posterior decompression, diskectomy and fusion with solid union. Hardware removal with pedicle shadows from L3 to the sacrum. Sufficient patency of the canal and foramina. Previous bone graft site right iliac bone. Sacroiliac osteoarthritis. IMPRESSION: No distinct cause of a clinical change is identified in the lumbar region. Old healed compression deformity at L2 without retropulsed bone. L2-3: Mild multifactorial stenosis because of endplate osteophytes, bulging of the disc and facet hypertrophy. No likely compression of the neural structures however. Wide patency of the canal and foramina in the decompression and fusion segment from L3 to the sacrum. Neuro stimulators in place. The lead which enters at the L4 level appears to traverse the thecal sac, presumably a chronic situation. Electronically Signed   By: Nelson Chimes M.D.   On: 07/14/2017 10:24   Dg Abd Acute W/chest  Result Date: 07/12/2017 CLINICAL DATA:  Abdominal pain and constipation. EXAM: DG ABDOMEN ACUTE W/ 1V CHEST COMPARISON:  Chest radiograph Dec 04, 2016; chest CT July 11, 2017 FINDINGS: Chest radiograph: There is no appreciable edema or consolidation. Heart size and pulmonary vascularity are normal. Previous thoracoplasty on the right anteriorly is better seen on recent CT. Supine and upright abdomen: There is moderate stool in the colon. Loops of mildly dilated bowel left abdomen appear primarily to represent loops of colon. There is no appreciable small bowel dilatation. No air-fluid level. No free air. The stimulator is noted over the right pelvis. IMPRESSION: Question a degree of ileus or early enterocolitis. No bowel obstruction evident. No free air. No lung edema or consolidation. Postoperative change noted in right hemithorax, better appreciated on recent CT. Electronically Signed   By: Lowella Grip III M.D.   On: 07/12/2017 20:18    Microbiology: No results found for this or any previous visit (from the past 240 hour(s)).    Labs: Basic Metabolic Panel: Recent Labs  Lab 07/11/17 0829 07/12/17 1917 07/13/17 0441  NA 136 135 135  K 3.4* 3.9 3.2*  CL 93* 92* 95*  CO2 31 31 30   GLUCOSE 130* 129* 106*  BUN 7 5* 5*  CREATININE 0.98 1.02 0.87  CALCIUM 9.3 9.6 9.0   Liver Function Tests: Recent Labs  Lab 07/12/17 1917  AST 35  ALT 31  ALKPHOS 59  BILITOT 0.7  PROT 7.9  ALBUMIN 3.9   No results for input(s): LIPASE, AMYLASE in the last 168 hours. No results for input(s): AMMONIA in the last 168 hours. CBC: Recent Labs  Lab 07/12/17 1917 07/13/17 0441  WBC 8.9 9.7  NEUTROABS 6.9  --   HGB 13.3 12.5*  HCT 41.1 38.5*  MCV 93.8 92.5  PLT 193 172   Cardiac Enzymes: No results for input(s): CKTOTAL, CKMB, CKMBINDEX, TROPONINI in the last 168 hours. BNP: BNP (last 3 results) Recent Labs    08/14/16 1612  BNP 28.4    ProBNP (last 3 results) No results for input(s): PROBNP in the last 8760 hours.  CBG: No results for input(s): GLUCAP in the last 168 hours.     Signed:  Nita Sells MD   [facility] 07/16/2017, 3:37 PM

## 2017-08-27 ENCOUNTER — Ambulatory Visit: Payer: Medicare HMO | Admitting: Thoracic Surgery (Cardiothoracic Vascular Surgery)

## 2017-08-28 ENCOUNTER — Emergency Department (HOSPITAL_COMMUNITY): Payer: Medicare HMO

## 2017-08-28 ENCOUNTER — Inpatient Hospital Stay (HOSPITAL_COMMUNITY)
Admission: EM | Admit: 2017-08-28 | Discharge: 2017-09-02 | DRG: 312 | Disposition: A | Discharge status: Home Health | Payer: Medicare HMO | Attending: Internal Medicine | Admitting: Internal Medicine

## 2017-08-28 ENCOUNTER — Encounter (HOSPITAL_COMMUNITY): Payer: Self-pay | Admitting: Emergency Medicine

## 2017-08-28 DIAGNOSIS — D1809 Hemangioma of other sites: Secondary | ICD-10-CM | POA: Diagnosis present

## 2017-08-28 DIAGNOSIS — E861 Hypovolemia: Secondary | ICD-10-CM | POA: Diagnosis present

## 2017-08-28 DIAGNOSIS — I110 Hypertensive heart disease with heart failure: Secondary | ICD-10-CM | POA: Diagnosis present

## 2017-08-28 DIAGNOSIS — Z6838 Body mass index (BMI) 38.0-38.9, adult: Secondary | ICD-10-CM

## 2017-08-28 DIAGNOSIS — K5900 Constipation, unspecified: Secondary | ICD-10-CM | POA: Diagnosis present

## 2017-08-28 DIAGNOSIS — F419 Anxiety disorder, unspecified: Secondary | ICD-10-CM | POA: Diagnosis present

## 2017-08-28 DIAGNOSIS — Z885 Allergy status to narcotic agent status: Secondary | ICD-10-CM

## 2017-08-28 DIAGNOSIS — D649 Anemia, unspecified: Secondary | ICD-10-CM | POA: Diagnosis present

## 2017-08-28 DIAGNOSIS — G894 Chronic pain syndrome: Secondary | ICD-10-CM | POA: Diagnosis present

## 2017-08-28 DIAGNOSIS — R000 Tachycardia, unspecified: Secondary | ICD-10-CM | POA: Diagnosis present

## 2017-08-28 DIAGNOSIS — N400 Enlarged prostate without lower urinary tract symptoms: Secondary | ICD-10-CM | POA: Diagnosis present

## 2017-08-28 DIAGNOSIS — M1711 Unilateral primary osteoarthritis, right knee: Secondary | ICD-10-CM | POA: Diagnosis present

## 2017-08-28 DIAGNOSIS — R29700 NIHSS score 0: Secondary | ICD-10-CM | POA: Diagnosis present

## 2017-08-28 DIAGNOSIS — E860 Dehydration: Secondary | ICD-10-CM | POA: Diagnosis present

## 2017-08-28 DIAGNOSIS — R634 Abnormal weight loss: Secondary | ICD-10-CM | POA: Diagnosis present

## 2017-08-28 DIAGNOSIS — Z79899 Other long term (current) drug therapy: Secondary | ICD-10-CM

## 2017-08-28 DIAGNOSIS — Z955 Presence of coronary angioplasty implant and graft: Secondary | ICD-10-CM

## 2017-08-28 DIAGNOSIS — R42 Dizziness and giddiness: Secondary | ICD-10-CM | POA: Diagnosis not present

## 2017-08-28 DIAGNOSIS — R5381 Other malaise: Secondary | ICD-10-CM | POA: Diagnosis present

## 2017-08-28 DIAGNOSIS — R402413 Glasgow coma scale score 13-15, at hospital admission: Secondary | ICD-10-CM | POA: Diagnosis present

## 2017-08-28 DIAGNOSIS — Z720 Tobacco use: Secondary | ICD-10-CM | POA: Diagnosis present

## 2017-08-28 DIAGNOSIS — R001 Bradycardia, unspecified: Secondary | ICD-10-CM | POA: Diagnosis present

## 2017-08-28 DIAGNOSIS — G709 Myoneural disorder, unspecified: Secondary | ICD-10-CM | POA: Diagnosis present

## 2017-08-28 DIAGNOSIS — R531 Weakness: Secondary | ICD-10-CM | POA: Diagnosis not present

## 2017-08-28 DIAGNOSIS — Z881 Allergy status to other antibiotic agents status: Secondary | ICD-10-CM

## 2017-08-28 DIAGNOSIS — Z85830 Personal history of malignant neoplasm of bone: Secondary | ICD-10-CM

## 2017-08-28 DIAGNOSIS — Z87891 Personal history of nicotine dependence: Secondary | ICD-10-CM

## 2017-08-28 DIAGNOSIS — C7951 Secondary malignant neoplasm of bone: Secondary | ICD-10-CM | POA: Diagnosis present

## 2017-08-28 DIAGNOSIS — E785 Hyperlipidemia, unspecified: Secondary | ICD-10-CM | POA: Diagnosis present

## 2017-08-28 DIAGNOSIS — T501X5A Adverse effect of loop [high-ceiling] diuretics, initial encounter: Secondary | ICD-10-CM | POA: Diagnosis present

## 2017-08-28 DIAGNOSIS — I951 Orthostatic hypotension: Principal | ICD-10-CM | POA: Diagnosis present

## 2017-08-28 DIAGNOSIS — M545 Low back pain: Secondary | ICD-10-CM | POA: Diagnosis present

## 2017-08-28 DIAGNOSIS — I5032 Chronic diastolic (congestive) heart failure: Secondary | ICD-10-CM | POA: Diagnosis present

## 2017-08-28 DIAGNOSIS — Z7951 Long term (current) use of inhaled steroids: Secondary | ICD-10-CM

## 2017-08-28 DIAGNOSIS — I2510 Atherosclerotic heart disease of native coronary artery without angina pectoris: Secondary | ICD-10-CM | POA: Diagnosis present

## 2017-08-28 DIAGNOSIS — I10 Essential (primary) hypertension: Secondary | ICD-10-CM | POA: Diagnosis not present

## 2017-08-28 DIAGNOSIS — R55 Syncope and collapse: Secondary | ICD-10-CM

## 2017-08-28 DIAGNOSIS — H538 Other visual disturbances: Secondary | ICD-10-CM | POA: Diagnosis present

## 2017-08-28 DIAGNOSIS — Z978 Presence of other specified devices: Secondary | ICD-10-CM

## 2017-08-28 DIAGNOSIS — T446X5A Adverse effect of alpha-adrenoreceptor antagonists, initial encounter: Secondary | ICD-10-CM | POA: Diagnosis present

## 2017-08-28 DIAGNOSIS — Z880 Allergy status to penicillin: Secondary | ICD-10-CM

## 2017-08-28 DIAGNOSIS — I252 Old myocardial infarction: Secondary | ICD-10-CM

## 2017-08-28 DIAGNOSIS — K219 Gastro-esophageal reflux disease without esophagitis: Secondary | ICD-10-CM | POA: Diagnosis present

## 2017-08-28 DIAGNOSIS — Z96651 Presence of right artificial knee joint: Secondary | ICD-10-CM | POA: Diagnosis present

## 2017-08-28 DIAGNOSIS — Z7982 Long term (current) use of aspirin: Secondary | ICD-10-CM

## 2017-08-28 DIAGNOSIS — Z79891 Long term (current) use of opiate analgesic: Secondary | ICD-10-CM

## 2017-08-28 DIAGNOSIS — R29701 NIHSS score 1: Secondary | ICD-10-CM | POA: Diagnosis not present

## 2017-08-28 LAB — CBC
HEMATOCRIT: 39.8 % (ref 39.0–52.0)
HEMOGLOBIN: 12.9 g/dL — AB (ref 13.0–17.0)
MCH: 30.3 pg (ref 26.0–34.0)
MCHC: 32.4 g/dL (ref 30.0–36.0)
MCV: 93.4 fL (ref 78.0–100.0)
Platelets: 198 10*3/uL (ref 150–400)
RBC: 4.26 MIL/uL (ref 4.22–5.81)
RDW: 13.9 % (ref 11.5–15.5)
WBC: 8.4 10*3/uL (ref 4.0–10.5)

## 2017-08-28 LAB — URINALYSIS, ROUTINE W REFLEX MICROSCOPIC
Bilirubin Urine: NEGATIVE
Glucose, UA: NEGATIVE mg/dL
Hgb urine dipstick: NEGATIVE
Ketones, ur: NEGATIVE mg/dL
LEUKOCYTES UA: NEGATIVE
NITRITE: NEGATIVE
PH: 8 (ref 5.0–8.0)
Protein, ur: NEGATIVE mg/dL
SPECIFIC GRAVITY, URINE: 1.005 (ref 1.005–1.030)

## 2017-08-28 LAB — BASIC METABOLIC PANEL
ANION GAP: 14 (ref 5–15)
BUN: 8 mg/dL (ref 6–20)
CO2: 29 mmol/L (ref 22–32)
Calcium: 9.4 mg/dL (ref 8.9–10.3)
Chloride: 95 mmol/L — ABNORMAL LOW (ref 101–111)
Creatinine, Ser: 1.02 mg/dL (ref 0.61–1.24)
GLUCOSE: 134 mg/dL — AB (ref 65–99)
POTASSIUM: 3.6 mmol/L (ref 3.5–5.1)
Sodium: 138 mmol/L (ref 135–145)

## 2017-08-28 LAB — HEPATIC FUNCTION PANEL
ALBUMIN: 3.7 g/dL (ref 3.5–5.0)
ALT: 31 U/L (ref 17–63)
AST: 24 U/L (ref 15–41)
Alkaline Phosphatase: 62 U/L (ref 38–126)
Bilirubin, Direct: <0.1 mg/dL — ABNORMAL LOW (ref 0.1–0.5)
TOTAL PROTEIN: 6.9 g/dL (ref 6.5–8.1)
Total Bilirubin: 0.8 mg/dL (ref 0.3–1.2)

## 2017-08-28 LAB — I-STAT TROPONIN, ED: Troponin i, poc: 0.01 ng/mL (ref 0.00–0.08)

## 2017-08-28 LAB — LIPASE, BLOOD: Lipase: 22 U/L (ref 11–51)

## 2017-08-28 MED ORDER — ACETAMINOPHEN 325 MG PO TABS
650.0000 mg | ORAL_TABLET | ORAL | Status: DC | PRN
  Filled 2017-08-28: qty 2

## 2017-08-28 MED ORDER — ACETAMINOPHEN 160 MG/5ML PO SOLN: 650.0000 mg | ORAL | Status: DC | PRN

## 2017-08-28 MED ORDER — ACETAMINOPHEN 650 MG RE SUPP: 650.0000 mg | RECTAL | Status: DC | PRN

## 2017-08-28 MED ORDER — OXYCODONE HCL 5 MG PO TABS
30.0000 mg | ORAL_TABLET | Freq: Four times a day (QID) | ORAL | Status: DC
  Administered 2017-08-28: 30 mg via ORAL
  Filled 2017-08-28: qty 6

## 2017-08-28 MED ORDER — ENOXAPARIN SODIUM 40 MG/0.4ML ~~LOC~~ SOLN
40.0000 mg | SUBCUTANEOUS | Status: DC
  Administered 2017-08-28 – 2017-09-01 (×5): 40 mg via SUBCUTANEOUS
  Filled 2017-08-28 (×5): qty 0.4

## 2017-08-28 MED ORDER — ADULT MULTIVITAMIN W/MINERALS CH
1.0000 | ORAL_TABLET | Freq: Every day | ORAL | Status: DC
  Administered 2017-08-28 – 2017-09-02 (×6): 1 via ORAL
  Filled 2017-08-28 (×6): qty 1

## 2017-08-28 MED ORDER — FUROSEMIDE 20 MG PO TABS
20.0000 mg | ORAL_TABLET | Freq: Every day | ORAL | Status: DC
  Administered 2017-08-29 – 2017-08-31 (×3): 20 mg via ORAL
  Filled 2017-08-28 (×3): qty 1

## 2017-08-28 MED ORDER — ASPIRIN EC 81 MG PO TBEC
81.0000 mg | DELAYED_RELEASE_TABLET | Freq: Every day | ORAL | Status: DC
  Administered 2017-08-28 – 2017-09-02 (×6): 81 mg via ORAL
  Filled 2017-08-28 (×6): qty 1

## 2017-08-28 MED ORDER — CALCIUM CARBONATE ANTACID 500 MG PO CHEW: 1.0000 | CHEWABLE_TABLET | Freq: Every day | ORAL | Status: DC | PRN

## 2017-08-28 MED ORDER — PANTOPRAZOLE SODIUM 40 MG PO TBEC
40.0000 mg | DELAYED_RELEASE_TABLET | Freq: Two times a day (BID) | ORAL | Status: DC
  Administered 2017-08-28 – 2017-09-02 (×10): 40 mg via ORAL
  Filled 2017-08-28 (×10): qty 1

## 2017-08-28 MED ORDER — STROKE: EARLY STAGES OF RECOVERY BOOK
Freq: Once | Status: AC
  Administered 2017-08-28: 23:00:00
  Filled 2017-08-28: qty 1

## 2017-08-28 MED ORDER — SENNOSIDES-DOCUSATE SODIUM 8.6-50 MG PO TABS
1.0000 | ORAL_TABLET | Freq: Every evening | ORAL | Status: DC | PRN
  Administered 2017-08-30: 1 via ORAL
  Filled 2017-08-28: qty 1

## 2017-08-28 MED ORDER — MOMETASONE FURO-FORMOTEROL FUM 200-5 MCG/ACT IN AERO
2.0000 | INHALATION_SPRAY | Freq: Two times a day (BID) | RESPIRATORY_TRACT | Status: DC
  Administered 2017-08-29 – 2017-09-02 (×9): 2 via RESPIRATORY_TRACT
  Filled 2017-08-28: qty 8.8

## 2017-08-28 MED ORDER — VITAMIN D 1000 UNITS PO TABS
1000.0000 [IU] | ORAL_TABLET | Freq: Every day | ORAL | Status: DC
  Administered 2017-08-28 – 2017-09-02 (×6): 1000 [IU] via ORAL
  Filled 2017-08-28 (×6): qty 1

## 2017-08-28 MED ORDER — OXYCODONE HCL 5 MG PO TABS
30.0000 mg | ORAL_TABLET | Freq: Once | ORAL | Status: AC
  Administered 2017-08-28: 30 mg via ORAL
  Filled 2017-08-28: qty 6

## 2017-08-28 MED ORDER — POLYETHYLENE GLYCOL 3350 17 G PO PACK
17.0000 g | PACK | Freq: Every day | ORAL | Status: DC
  Administered 2017-08-28 – 2017-08-31 (×4): 17 g via ORAL
  Filled 2017-08-28 (×4): qty 1

## 2017-08-28 MED ORDER — PRAVASTATIN SODIUM 20 MG PO TABS
20.0000 mg | ORAL_TABLET | Freq: Every day | ORAL | Status: DC
  Administered 2017-08-28 – 2017-09-02 (×6): 20 mg via ORAL
  Filled 2017-08-28 (×6): qty 1

## 2017-08-28 MED ORDER — FERROUS SULFATE 325 (65 FE) MG PO TABS
325.0000 mg | ORAL_TABLET | Freq: Every day | ORAL | Status: DC
  Administered 2017-08-29 – 2017-09-02 (×5): 325 mg via ORAL
  Filled 2017-08-28 (×5): qty 1

## 2017-08-28 MED ORDER — FUROSEMIDE 20 MG PO TABS
10.0000 mg | ORAL_TABLET | Freq: Every day | ORAL | Status: DC
  Administered 2017-08-29 – 2017-08-30 (×2): 10 mg via ORAL
  Filled 2017-08-28 (×2): qty 1

## 2017-08-28 MED ORDER — POTASSIUM CHLORIDE CRYS ER 20 MEQ PO TBCR
20.0000 meq | EXTENDED_RELEASE_TABLET | Freq: Every day | ORAL | Status: DC
  Administered 2017-08-28 – 2017-09-02 (×6): 20 meq via ORAL
  Filled 2017-08-28 (×6): qty 1

## 2017-08-28 MED ORDER — DOXAZOSIN MESYLATE 1 MG PO TABS
1.0000 mg | ORAL_TABLET | Freq: Every evening | ORAL | Status: DC
  Administered 2017-08-28 – 2017-08-29 (×2): 1 mg via ORAL
  Filled 2017-08-28 (×3): qty 1

## 2017-08-28 NOTE — ED Notes (Signed)
Patient has chronic mid back pain and bilateral leg pain; he reports this is not new and does not feel worse than normal. 4/10. Has implanted morphine pump in RLQ that delivers a basal rate continuously. Also oxy for breakthrough pain.

## 2017-08-28 NOTE — ED Notes (Signed)
Pt informed that he needs to urinate; he is complaining of back pain and requesting breakthrough pain meds he takes at home. 30mg  oxy. MD informed.
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
## 2017-08-28 NOTE — Progress Notes (Signed)
Pt arrived room, [phone]. Pt was assessed to be AxOx4. Cardiac telemetry initiated. Skin assessment completed. Vital signs completed. Pt settled and oriented to the room. Will continue to monitor pt.

## 2017-08-28 NOTE — ED Notes (Signed)
Pt asked to urinate; urinal provided.

## 2017-08-28 NOTE — ED Notes (Signed)
Patient transported to X-ray 
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
## 2017-08-28 NOTE — ED Triage Notes (Signed)
Pt to ER sent by PCP for 4-5 days near syncope. States sent here for cardiac evaluation. VSS at this time. States PCP requested a CT of chest.

## 2017-08-28 NOTE — ED Notes (Signed)
Tried to get patient to walk in his room with assistance and pt kept complaining of being dizzy and stated he was going to throw up.

## 2017-08-29 ENCOUNTER — Observation Stay (HOSPITAL_COMMUNITY): Payer: Medicare HMO

## 2017-08-29 ENCOUNTER — Observation Stay (HOSPITAL_COMMUNITY): Payer: Medicare HMO | Admitting: Certified Registered"

## 2017-08-29 ENCOUNTER — Encounter (HOSPITAL_COMMUNITY)
Admission: EM | Disposition: A | Discharge status: Home Health | Payer: Self-pay | Source: Home / Self Care | Attending: Internal Medicine

## 2017-08-29 ENCOUNTER — Other Ambulatory Visit: Payer: Self-pay

## 2017-08-29 ENCOUNTER — Encounter (HOSPITAL_COMMUNITY): Payer: Self-pay | Admitting: Certified Registered"

## 2017-08-29 DIAGNOSIS — R531 Weakness: Secondary | ICD-10-CM

## 2017-08-29 DIAGNOSIS — R42 Dizziness and giddiness: Secondary | ICD-10-CM | POA: Diagnosis not present

## 2017-08-29 DIAGNOSIS — G894 Chronic pain syndrome: Secondary | ICD-10-CM | POA: Diagnosis not present

## 2017-08-29 DIAGNOSIS — I10 Essential (primary) hypertension: Secondary | ICD-10-CM | POA: Diagnosis not present

## 2017-08-29 HISTORY — PX: RADIOLOGY WITH ANESTHESIA: SHX6223

## 2017-08-29 LAB — LIPID PANEL
CHOL/HDL RATIO: 3.6 ratio
Cholesterol: 142 mg/dL (ref 0–200)
HDL: 40 mg/dL — ABNORMAL LOW (ref >40)
LDL Cholesterol: 70 mg/dL (ref 0–99)
Triglycerides: 162 mg/dL — ABNORMAL HIGH (ref <150)
VLDL: 32 mg/dL (ref 0–40)

## 2017-08-29 LAB — CORTISOL: Cortisol, Plasma: 17 ug/dL

## 2017-08-29 LAB — ECHOCARDIOGRAM COMPLETE

## 2017-08-29 LAB — HEMOGLOBIN A1C
HEMOGLOBIN A1C: 5.8 % — AB (ref 4.8–5.6)
MEAN PLASMA GLUCOSE: 119.76 mg/dL

## 2017-08-29 SURGERY — MRI WITH ANESTHESIA
Anesthesia: General

## 2017-08-29 MED ORDER — GADOBENATE DIMEGLUMINE 529 MG/ML IV SOLN
20.0000 mL | Freq: Once | INTRAVENOUS | Status: AC
  Administered 2017-08-29: 20 mL via INTRAVENOUS

## 2017-08-29 MED ORDER — PROMETHAZINE HCL 25 MG PO TABS
25.0000 mg | ORAL_TABLET | Freq: Four times a day (QID) | ORAL | Status: DC | PRN
  Filled 2017-08-29: qty 1

## 2017-08-29 MED ORDER — FENTANYL CITRATE (PF) 100 MCG/2ML IJ SOLN
INTRAMUSCULAR | Status: AC
  Filled 2017-08-29: qty 2

## 2017-08-29 MED ORDER — FENTANYL CITRATE (PF) 100 MCG/2ML IJ SOLN
25.0000 ug | INTRAMUSCULAR | Status: DC | PRN
  Administered 2017-08-29: 25 ug via INTRAVENOUS
  Administered 2017-08-29: 50 ug via INTRAVENOUS
  Administered 2017-08-29: 25 ug via INTRAVENOUS

## 2017-08-29 MED ORDER — OXYCODONE HCL 5 MG PO TABS
30.0000 mg | ORAL_TABLET | Freq: Four times a day (QID) | ORAL | Status: DC | PRN
  Administered 2017-08-29 – 2017-09-02 (×15): 30 mg via ORAL
  Filled 2017-08-29 (×15): qty 6

## 2017-08-29 MED ORDER — ENSURE ENLIVE PO LIQD
237.0000 mL | Freq: Two times a day (BID) | ORAL | Status: DC
  Administered 2017-08-29 – 2017-09-02 (×8): 237 mL via ORAL

## 2017-08-29 NOTE — Progress Notes (Signed)
Attending MD paged to clarify diet orders for pt and an order for a breakthrough pain medicine. Care handed over to the day RN.

## 2017-08-29 NOTE — Care Management Obs Status (Signed)
MEDICARE OBSERVATION STATUS NOTIFICATION   Patient Details  Name: SAMANYU TINNELL MRN: [id number] Date of Birth: 12-16-1950   Medicare Observation Status Notification Given:  Yes    Carles Collet, RN 08/29/2017, 10:00 AM

## 2017-08-29 NOTE — Progress Notes (Signed)
PROGRESS NOTE    Jerry [name]  QQV:[id number] DOB: 1951/05/30 DOA: 08/28/2017 PCP: Aletha Halim., PA-C   Outpatient Specialists:     Brief Narrative:  Jerry [name] is a 67 y.o. male with medical history significant of chronic pain, HTN, HLD, tobacco abuse who comes in with a 4-5 day history of dizziness and feeling of "passing out".  He is unable to provide information of room spinning or unsteadiness with position changes --- he says he is "just dizzy" and that "something is wrong with me".   Patient denies seeing spots but does say his vision has been getting blurry.  He went to see his PCP this AM and she sent him to the ER for an evaluation.   He reports increasing leg swelling b/l.  But has not been wearing his TED hose He also reported some chest discomfort and left arm tightness to the ER doctor. No new medications or change in medications.  Denies sick contacts.  Lives with son and daughter in law.  Recently in the hospital in December with back pain and d/c'd on steroids.  He also c/o increased thirst and weight loss of 45lbs in the last 10 weeks.  No fever, no chills, no dyspnea      Assessment & Plan:   Active Problems:   Chronic pain syndrome   Morbid obesity (HCC)   Tobacco abuse   Debilitated   Essential hypertension   HLD (hyperlipidemia)   Anxiety   Dizziness   Dizziness -unclear etiology -history and exam were inconsistent -discussed with on-call neuro who suggested high risk for CVA so would recommend MRI/MRA -patent does not tolerate laying flat so all scans have to be done with anesthesia which certainly complicates his situation -FLP: 70/HgbA1C: 5.8 -PT/OT -continue ASA -if MRI positive then would require a neuro consult  Bradycardia -not on any rate slowing medications per initial medication review -monitor on tele -? Need for outpatient OSA eval  Tobacco abuse h/o -cessation  Chronic pain -continue home  regimen -patient was due to have MRI under anesthesia [facility] via PCP for evaluation of his morphine pump-- neurologist thinks needle may have moved-- spoke with  Dr. Ermalene Postin And plan to get done here while  HLD -continue statin -LDL 70  HTN -allow for some permissive HTN     DVT prophylaxis:  Lovenox   Code Status: Full Code   Family Communication:   Disposition Plan:     Consultants:     Subjective: Still having dizziness  Objective: Vitals:   08/29/17 0313 08/29/17 0537 08/29/17 0806 08/29/17 1122  BP: 124/60 127/61  135/68  Pulse: (!) 58 64  79  Resp: 16 17    Temp: (!) 97.5 F (36.4 C) 98.1 F (36.7 C)    TempSrc: Oral Oral    SpO2: 97% 100% 96%     Intake/Output Summary (Last 24 hours) at 08/29/2017 1315 Last data filed at 08/28/2017 1353 Gross per 24 hour  Intake -  Output 650 ml  Net -650 ml   There were no vitals filed for this visit.  Examination:  General exam: chronically ill appearing Respiratory system: no wheezing Cardiovascular system: rrr Gastrointestinal system: obese, +BS Central nervous system: Alert+oriented Extremities: moves all 4 ext-- LE weaker than UE Psychiatry: normal mood/effect    Data Reviewed: I have personally reviewed following labs and imaging studies  CBC: Recent Labs  Lab 08/28/17 0935  WBC 8.4  HGB 12.9*  HCT 39.8  MCV  93.4  PLT 160   Basic Metabolic Panel: Recent Labs  Lab 08/28/17 0935  NA 138  K 3.6  CL 95*  CO2 29  GLUCOSE 134*  BUN 8  CREATININE 1.02  CALCIUM 9.4   GFR: CrCl cannot be calculated (Unknown ideal weight.). Liver Function Tests: Recent Labs  Lab 08/28/17 0935  AST 24  ALT 31  ALKPHOS 62  BILITOT 0.8  PROT 6.9  ALBUMIN 3.7   Recent Labs  Lab 08/28/17 0935  LIPASE 22   No results for input(s): AMMONIA in the last 168 hours. Coagulation Profile: No results for input(s): INR, PROTIME in the last 168 hours. Cardiac Enzymes: No results for input(s):  CKTOTAL, CKMB, CKMBINDEX, TROPONINI in the last 168 hours. BNP (last 3 results) No results for input(s): PROBNP in the last 8760 hours. HbA1C: Recent Labs    08/29/17 1048  HGBA1C 5.8*   CBG: No results for input(s): GLUCAP in the last 168 hours. Lipid Profile: Recent Labs    08/29/17 1048  CHOL 142  HDL 40*  LDLCALC 70  TRIG 162*  CHOLHDL 3.6   Thyroid Function Tests: No results for input(s): TSH, T4TOTAL, FREET4, T3FREE, THYROIDAB in the last 72 hours. Anemia Panel: No results for input(s): VITAMINB12, FOLATE, FERRITIN, TIBC, IRON, RETICCTPCT in the last 72 hours. Urine analysis:    Component Value Date/Time   COLORURINE YELLOW 08/28/2017 1340   APPEARANCEUR CLEAR 08/28/2017 1340   LABSPEC 1.005 08/28/2017 1340   PHURINE 8.0 08/28/2017 1340   GLUCOSEU NEGATIVE 08/28/2017 1340   HGBUR NEGATIVE 08/28/2017 1340   BILIRUBINUR NEGATIVE 08/28/2017 1340   KETONESUR NEGATIVE 08/28/2017 1340   PROTEINUR NEGATIVE 08/28/2017 1340   UROBILINOGEN 1.0 06/10/2015 1106   NITRITE NEGATIVE 08/28/2017 1340   LEUKOCYTESUR NEGATIVE 08/28/2017 1340     )No results found for this or any previous visit (from the past 240 hour(s)).    Anti-infectives (From admission, onward)   None       Radiology Studies: Dg Chest 2 View  Result Date: 08/28/2017 CLINICAL DATA:  Near-syncope and bilateral lower extremity swelling for 4-5 days. EXAM: CHEST  2 VIEW COMPARISON:  PA and lateral chest 12/04/2016.  CT chest 07/11/2017. FINDINGS: Volume loss in the right chest with some right basilar scar appears unchanged. The left lung is clear. No consolidative process, edema pneumothorax. Heart size is enlarged. Postoperative change of right thoracoplasty is much better seen on the patient's CT scan. Heart size is normal. No acute bony abnormality. IMPRESSION: No acute disease. Electronically Signed   By: Inge Rise M.D.   On: 08/28/2017 11:00        Scheduled Meds: . aspirin EC  81 mg Oral  Daily  . cholecalciferol  1,000 Units Oral Daily  . doxazosin  1 mg Oral QPM  . enoxaparin (LOVENOX) injection  40 mg Subcutaneous Q24H  . feeding supplement (ENSURE ENLIVE)  237 mL Oral BID BM  . ferrous sulfate  325 mg Oral Q breakfast  . furosemide  10 mg Oral q1800  . furosemide  20 mg Oral QAC breakfast  . mometasone-formoterol  2 puff Inhalation BID  . multivitamin with minerals  1 tablet Oral Daily  . pantoprazole  40 mg Oral BID  . polyethylene glycol  17 g Oral Daily  . potassium chloride  20 mEq Oral Daily  . pravastatin  20 mg Oral q1800   Continuous Infusions:   LOS: 0 days    Time spent: 35 min  Geradine Girt, DO [facility] Pager [phone]  If 7PM-7AM, please contact night-coverage [email or web address] Password [email or web address] 08/29/2017, 1:15 PM

## 2017-08-29 NOTE — Progress Notes (Signed)
Pt goes bradycardic to the mid-high 40's when asleep, asymptomatic. Note left for provider. Will continue to monitor.

## 2017-08-29 NOTE — Anesthesia Preprocedure Evaluation (Signed)
Anesthesia Evaluation  Patient identified by MRN, date of birth, ID band Patient awake    Reviewed: Allergy & Precautions, H&P , NPO status , Patient's Chart, lab work & pertinent test results  History of Anesthesia Complications (+) POST - OP SPINAL HEADACHENegative for: history of anesthetic complications  Airway Mallampati: II  TM Distance: >3 FB Neck ROM: full    Dental  (+) Dental Advisory Given   Pulmonary shortness of breath, pneumonia, former smoker,    breath sounds clear to auscultation       Cardiovascular hypertension, Pt. on medications + CAD and +CHF   Rhythm:regular Rate:Normal  TTE (08/2016): EF 55%  TTE 08/2017: LVSF WNL   Neuro/Psych PSYCHIATRIC DISORDERS Anxiety Depression  Neuromuscular disease    GI/Hepatic GERD  Medicated,  Endo/Other  Morbid obesity  Renal/GU      Musculoskeletal  (+) Arthritis ,   Abdominal   Peds  Hematology  (+) anemia ,   Anesthesia Other Findings   Reproductive/Obstetrics                            Anesthesia Physical  Anesthesia Plan  ASA: III  Anesthesia Plan: General   Post-op Pain Management:    Induction: Intravenous  PONV Risk Score and Plan: 1 and Treatment may vary due to age or medical condition  Airway Management Planned: Oral ETT  Additional Equipment:   Intra-op Plan:   Post-operative Plan: Possible Post-op intubation/ventilation  Informed Consent: I have reviewed the patients History and Physical, chart, labs and discussed the procedure including the risks, benefits and alternatives for the proposed anesthesia with the patient or authorized representative who has indicated his/her understanding and acceptance.   Dental advisory given  Plan Discussed with: CRNA  Anesthesia Plan Comments:         Anesthesia Quick Evaluation

## 2017-08-29 NOTE — Progress Notes (Signed)
PT Cancellation Note  Patient Details Name: Jerry [name] MRN: [id number] DOB: 05/04/1951   Cancelled Treatment:    Reason Eval/Treat Not Completed: (P) Patient at procedure or test/unavailable PT will evaluate pt tomorrow.   Kerin Cecchi B. Migdalia Dk PT, DPT Acute Rehabilitation  [phone] Pager [phone]   [place] 08/29/2017, 4:20 PM

## 2017-08-29 NOTE — Progress Notes (Signed)
  Echocardiogram 2D Echocardiogram has been performed.  Jerry [name] 08/29/2017, 10:44 AM

## 2017-08-29 NOTE — Progress Notes (Signed)
Carotid duplex prelim: 1-39% ICA stenosis.  Frank Pilger Eunice, RDMS, RVT   

## 2017-08-29 NOTE — Progress Notes (Signed)
PT Cancellation Note  Patient Details Name: Jerry [name] MRN: [id number] DOB: Jun 06, 1951   Cancelled Treatment:    Reason Eval/Treat Not Completed: (P) Patient at procedure or test/unavailable Pt off floor at carotid doppler. PT will follow back later for evaluation.  Brallan Denio B. Migdalia Dk PT, DPT Acute Rehabilitation  [phone] Pager [phone]     [place] 08/29/2017, 9:33 AM

## 2017-08-29 NOTE — Transfer of Care (Signed)
Immediate Anesthesia Transfer of Care Note  Patient: Jerry [name]  Procedure(s) Performed: MRI WITH ANESTHESIA (N/A )  Patient Location: PACU  Anesthesia Type:General  Level of Consciousness: awake, oriented and patient cooperative  Airway & Oxygen Therapy: Patient Spontanous Breathing  Post-op Assessment: Report given to RN, Post -op Vital signs reviewed and stable and Patient moving all extremities  Post vital signs: Reviewed and stable  Last Vitals:  Vitals:   08/29/17 1122 08/29/17 1530  BP: 135/68   Pulse: 79   Resp:    Temp:  36.5 C  SpO2:      Last Pain:  Vitals:   08/29/17 0849  TempSrc:   PainSc: 2          Complications: No apparent anesthesia complications

## 2017-08-29 NOTE — Progress Notes (Signed)
MRI Tech was emailed the information for the [employer]plant information for MRI compatibility.

## 2017-08-29 NOTE — H&P (Addendum)
Anesthesia H&P Update: History and Physical Exam reviewed; patient is OK for planned anesthetic and procedure. ? ?

## 2017-08-29 NOTE — Progress Notes (Signed)
Pt passed stroke swallow test. NP on call paged twice and admitting MD paged once to have pt's diet order changed from NPO to heart healthy prior to midnight so as to be able to give pt his medications. Diet order have been put in by NP on call. Will continue to monitor.

## 2017-08-29 NOTE — Progress Notes (Signed)
Initial Nutrition Assessment  DOCUMENTATION CODES:   Obesity unspecified  INTERVENTION:  1. Premier Protein BID, each supplement provides 160 calories and 30gm protein  2. Magic cup TID with meals, each supplement provides 290 kcal and 9 grams of protein  NUTRITION DIAGNOSIS:   Unintentional weight loss related to acute illness as evidenced by per patient/family report.  GOAL:   Patient will meet greater than or equal to 90% of their needs  MONITOR:   PO intake, I & O's, Labs, Weight trends, Skin  REASON FOR ASSESSMENT:   Malnutrition Screening Tool    ASSESSMENT:   Jerry [name] is a 67 y.o. male with medical history significant of chronic pain, HTN, HLD, tobacco abuse who comes in with a 4-5 day history of dizziness and feeling of "passing out".  Patient was not available during visit. Chart reviewed. No documented meal completion. No weight or height upon admit. Concern for 45 pound weight loss in the last 10 weeks - 15% severe weight loss. Will follow-up.  Labs reviewed:  HGB A1C 5.8  Medications reviewed and include:  Vitamin D, Iron, MIralax, 20K+,     NUTRITION - FOCUSED PHYSICAL EXAM:    Most Recent Value  Orbital Region  Unable to assess  Upper Arm Region  Unable to assess  Thoracic and Lumbar Region  Unable to assess  Buccal Region  Unable to assess  Point Place Region  Unable to assess  Clavicle Bone Region  Unable to assess  Clavicle and Acromion Bone Region  Unable to assess  Scapular Bone Region  Unable to assess  Dorsal Hand  Unable to assess  Patellar Region  Unable to assess  Anterior Thigh Region  Unable to assess  Posterior Calf Region  Unable to assess  Edema (RD Assessment)  Unable to assess  Hair  Unable to assess  Eyes  Unable to assess  Mouth  Unable to assess  Skin  Unable to assess  Nails  Unable to assess       Diet Order:  Fall precautions Diet Heart Room service appropriate? Yes; Fluid consistency: Thin  EDUCATION  NEEDS:   Not appropriate for education at this time  Skin:  Skin Assessment: Reviewed RN Assessment  Last BM:  08/27/2017  Height:   Ht Readings from Last 1 Encounters:  07/13/17 6\' 1"  (1.854 m)    Weight:   Wt Readings from Last 1 Encounters:  07/13/17 294 lb 8.6 oz (133.6 kg)    Ideal Body Weight:  86.36 (Based on height from 07/13/2017)   BMI:  38.86 based on previous height and weight  Estimated Nutritional Needs:   Kcal:  2400-2600 calories  Protein:  130-150 grams  Fluid:  2.4-2.6L  Jerry [name]. Jerry Martindale, MS, RD LDN Inpatient Clinical Dietitian Pager [phone]

## 2017-08-29 NOTE — Progress Notes (Signed)
PT Cancellation Note  Patient Details Name: Jerry [name] MRN: [id number] DOB: 15-Nov-1950   Cancelled Treatment:    Reason Eval/Treat Not Completed: (P) Patient at procedure or test/unavailable Pt off floor at MRI. PT will check back later this afternoon if able.  Rush Salce B. Migdalia Dk PT, DPT Acute Rehabilitation  [phone] Pager [phone]     [place] 08/29/2017, 2:24 PM

## 2017-08-29 NOTE — Evaluation (Signed)
Occupational Therapy Evaluation Patient Details Name: Jerry [name] MRN: [id number] DOB: 05-30-1951 Today's Date: 08/29/2017    History of Present Illness 67 y.o. male with medical history significant of chronic pain, HTN, HLD, tobacco abuse who comes in with a 4-5 day history of dizziness and feeling of "passing out".  He is unable to provide information of room spinning or unsteadiness with position changes --- he says he is "just dizzy" and that "something is wrong with me".   Patient denies seeing spots but does say his vision has been getting blurry.  He went to see his PCP this AM and she sent him to the ER for an evaluation.     Clinical Impression   Pt currently presents at a min assist level for sit to stand and functional mobility using the RW for support.  Min to mod assist needed for LB selfcare sit to stand without use of AE.  He reports his head feeling like a lot of pressure behind his eyes but no reports of dizziness with positional change or with standing.  No noted nystagmus with these movements during gross assessment.  Feel he will benefit from ongoing acute level OT to progress back to supervision /modified independent level.  Recommend HHOT eval as well at discharge.     Follow Up Recommendations  Home health OT;Supervision/Assistance - 24 hour    Equipment Recommendations  None recommended by OT       Precautions / Restrictions Precautions Precautions: Fall Restrictions Weight Bearing Restrictions: No      Mobility Bed Mobility Overal bed mobility: Needs Assistance Bed Mobility: Supine to Sit     Supine to sit: Min assist     General bed mobility comments: Pt unable to lay flat at home secondary to back pain, reports that he sleeps in a recliner  Transfers Overall transfer level: Needs assistance Equipment used: Rolling walker (2 wheeled) Transfers: Stand Pivot Transfers   Stand pivot transfers: Min assist            Balance Overall balance  assessment: Needs assistance   Sitting balance-Leahy Scale: Good       Standing balance-Leahy Scale: Poor Standing balance comment: Needs UE support on the walker during standing and mobility this session.                            ADL either performed or assessed with clinical judgement   ADL Overall ADL's : Needs assistance/impaired Eating/Feeding: Independent   Grooming: Wash/dry hands;Wash/dry face;Sitting;Supervision/safety   Upper Body Bathing: Sitting;Supervision/ safety   Lower Body Bathing: Sit to/from stand;Minimal assistance   Upper Body Dressing : Supervision/safety;Sitting   Lower Body Dressing: Moderate assistance;Sit to/from stand   Toilet Transfer: Minimal assistance;RW;Ambulation;BSC   Toileting- Clothing Manipulation and Hygiene: Minimal assistance;Sit to/from stand       Functional mobility during ADLs: Minimal assistance;Rolling walker General ADL Comments: Pt with decreased ability to reach either LE for dressing tasks, will benefit from AE trial.  Reported feeling like his head was in a balloon, but not really dizziness.  BP taken in sitting and standing, with standing BP at 133/83.  Pt reporting his legs feel weak during standing.       Vision Baseline Vision/History: Wears glasses Wears Glasses: At all times Patient Visual Report: Blurring of vision Vision Assessment?: Yes Eye Alignment: Within Functional Limits Ocular Range of Motion: Within Functional Limits Tracking/Visual Pursuits: Able to track stimulus in all quads  without difficulty Visual Fields: No apparent deficits            Pertinent Vitals/Pain Pain Assessment: 0-10 Pain Score: 3  Pain Location: back and legs Pain Descriptors / Indicators: Discomfort Pain Intervention(s): Limited activity within patient's tolerance;Repositioned     Hand Dominance Right   Extremity/Trunk Assessment Upper Extremity Assessment Upper Extremity Assessment: Overall WFL for tasks  assessed   Lower Extremity Assessment Lower Extremity Assessment: Defer to PT evaluation   Cervical / Trunk Assessment Cervical / Trunk Assessment: Other exceptions(flexed trunk in standing)   Communication Communication Communication: No difficulties   Cognition Arousal/Alertness: Awake/alert Behavior During Therapy: WFL for tasks assessed/performed Overall Cognitive Status: Within Functional Limits for tasks assessed                                                Home Living Family/patient expects to be discharged to:: Private residence Living Arrangements: Children;Non-relatives/Friends(son and daughter in law) Available Help at Discharge: Family;Available PRN/intermittently(states family is at home with him 90% of the day) Type of Home: House Home Access: Stairs to enter [employer] of Steps: 4 Entrance Stairs-Rails: Right;Left Home Layout: One level     Bathroom Shower/Tub: Teacher, early years/pre: Handicapped height Bathroom Accessibility: No(walker is too big to fit through the door per report)   Home Equipment: Walker - standard;Cane - single point;Hand held shower head;Grab bars - tub/shower;Bedside commode;Tub bench          Prior Functioning/Environment Level of Independence: Independent        Comments: reports using cane for ambulation PTA        OT Problem List: Decreased strength;Impaired balance (sitting and/or standing);Pain;Decreased knowledge of use of DME or AE;Decreased activity tolerance      OT Treatment/Interventions: Self-care/ADL training;DME and/or AE instruction;Therapeutic activities;Balance training;Patient/family education    OT Goals(Current goals can be found in the care plan section) Acute Rehab OT Goals Patient Stated Goal: Get his head right. OT Goal Formulation: With patient Time For Goal Achievement: 09/12/17 Potential to Achieve Goals: Good  OT Frequency: Min 2X/week               AM-PAC PT "6 Clicks" Daily Activity     Outcome Measure Help from another person eating meals?: None Help from another person taking care of personal grooming?: None Help from another person toileting, which includes using toliet, bedpan, or urinal?: A Little Help from another person bathing (including washing, rinsing, drying)?: A Little Help from another person to put on and taking off regular upper body clothing?: None Help from another person to put on and taking off regular lower body clothing?: A Little 6 Click Score: 21   End of Session Equipment Utilized During Treatment: Gait belt;Rolling walker  Activity Tolerance: Other (comment)(Pt going down for MRI) Patient left: in bed;with call bell/phone within reach  OT Visit Diagnosis: Unsteadiness on feet (R26.81);Muscle weakness (generalized) (M62.81);Pain Pain - part of body: (back)                Time: 4268-3419 OT Time Calculation (min): 31 min Charges:  OT General Charges $OT Visit: 1 Visit OT Evaluation $OT Eval Moderate Complexity: 1 Mod OT Treatments $Self Care/Home Management : 8-22 mins G-Codes: OT G-codes **NOT FOR INPATIENT CLASS** Functional Limitation: Self care Self Care Current Status (Q2229): At least 20 percent but  less than 40 percent impaired, limited or restricted Self Care Goal Status (M6286): At least 1 percent but less than 20 percent impaired, limited or restricted   Athens Lebeau OTR/L 08/29/2017, 12:36 PM

## 2017-08-30 ENCOUNTER — Encounter (HOSPITAL_COMMUNITY): Payer: Self-pay | Admitting: Radiology

## 2017-08-30 DIAGNOSIS — E7800 Pure hypercholesterolemia, unspecified: Secondary | ICD-10-CM | POA: Diagnosis not present

## 2017-08-30 DIAGNOSIS — R42 Dizziness and giddiness: Secondary | ICD-10-CM | POA: Diagnosis not present

## 2017-08-30 DIAGNOSIS — I10 Essential (primary) hypertension: Secondary | ICD-10-CM | POA: Diagnosis not present

## 2017-08-30 DIAGNOSIS — F419 Anxiety disorder, unspecified: Secondary | ICD-10-CM | POA: Diagnosis not present

## 2017-08-30 DIAGNOSIS — G894 Chronic pain syndrome: Secondary | ICD-10-CM | POA: Diagnosis not present

## 2017-08-30 DIAGNOSIS — I951 Orthostatic hypotension: Secondary | ICD-10-CM | POA: Diagnosis not present

## 2017-08-30 DIAGNOSIS — R55 Syncope and collapse: Secondary | ICD-10-CM | POA: Diagnosis not present

## 2017-08-30 DIAGNOSIS — Z720 Tobacco use: Secondary | ICD-10-CM

## 2017-08-30 MED ORDER — BISACODYL 10 MG RE SUPP
10.0000 mg | Freq: Every day | RECTAL | Status: DC | PRN
  Administered 2017-08-30 – 2017-08-31 (×3): 10 mg via RECTAL
  Filled 2017-08-30 (×3): qty 1

## 2017-08-30 NOTE — Progress Notes (Signed)
Physical Therapy Treatment/Vestibular Assessment Patient Details Name: Jerry [name] MRN: [id number] DOB: 1950-09-14 Today's Date: 08/30/2017    History of Present Illness 67 y.o. male admitted on 08/28/17 for dizziness.  MRI negative for CVA.  MD suspicious of vestibular dysfunction.  Pt also being treated for chronic diastolic CHF.  Pt with significant PMH of HTN, CAD, chronic back pain, anxiety, anemia, bil TKA, and back surgery.    PT Comments    Vestibular assessment negative for vestibular pathology. He had negative orthostatic vitals.  Of note, HR ranged from 100-149 during gait in the hallway and pt reported that he got dizzy at the end of our walk when his HR was highest.  He would benefit from f/u therapy if he qualifies at discharge.   PT to follow acutely for deficits listed below.      08/30/17 1632  Vital Signs  Patient Position (if appropriate) Orthostatic Vitals  Orthostatic Lying   BP- Lying 145/68  Pulse- Lying 100  Orthostatic Sitting  BP- Sitting 147/73  Pulse- Sitting 99  Orthostatic Standing at 0 minutes  BP- Standing at 0 minutes 130/67  Pulse- Standing at 0 minutes 126  Orthostatic Standing at 3 minutes  BP- Standing at 3 minutes 130/77  Pulse- Standing at 3 minutes 127  Oxygen Therapy  SpO2 97 %  O2 Device Room Air     Follow Up Recommendations  Home health PT;Supervision - Intermittent     Equipment Recommendations  None recommended by PT    Recommendations for Other Services   NA     Precautions / Restrictions Precautions Precautions: Fall Precaution Comments: h/o falls and LE weakness    Mobility  Bed Mobility Overal bed mobility: Modified Independent                Transfers Overall transfer level: Needs assistance Equipment used: Rolling walker (2 wheeled) Transfers: Sit to/from Stand Sit to Stand: Supervision;From elevated surface         General transfer comment: supervision for  safety  Ambulation/Gait Ambulation/Gait assistance: Min guard Ambulation Distance (Feet): 80 Feet Assistive device: Rolling walker (2 wheeled) Gait Pattern/deviations: Step-through pattern;Shuffle;Trunk flexed Gait velocity: decreased Gait velocity interpretation: Below normal speed for age/gender General Gait Details: Pt reporting feeling heart racing, dizziness started the further he walked and the higher his HR went (up to 149 max observed by PT, started at 100)          Balance Overall balance assessment: Needs assistance Sitting-balance support: Feet supported;No upper extremity supported Sitting balance-Leahy Scale: Good     Standing balance support: Bilateral upper extremity supported Standing balance-Leahy Scale: Fair       08/30/17 1725  Vestibular Assessment  General Observation Pt reports tinnitus R ear, decreased hearing in this ear, has never had his eyes examined, but wears reading glasses he has purchased from [employer] to read.  No recent head trauma, no recent URI, reports migranous HA this admission  Symptom Behavior  Type of Dizziness Imbalance  Frequency of Dizziness when up on his feet  Duration of Dizziness when up  Aggravating Factors Activity in general  Relieving Factors Lying supine  Occulomotor Exam  Occulomotor Alignment Normal  Spontaneous Absent  Gaze-induced Absent  Smooth Pursuits Intact  Auditory  Comments grossly less in R ear, with reports of tinnitus  Positional Sensitivities  Positional Sensitivities Comments Pt with no dizziness with transitions in bed, up or down out of bed, so I did not feel appropriate to do dix  hallpike. he had no dizziness or nystagmus when rolling to his side to get up OOB.   Orthostatics  Orthostatics Comment see grid   08/30/2017 I did not see any obvious signs that this would be BPPV and given his history of low back pain, I did not feel it necessary to put him through a [employer] without evidence from  other testing that would make me believe he had positional vertigo.                            Cognition Arousal/Alertness: Awake/alert Behavior During Therapy: WFL for tasks assessed/performed Overall Cognitive Status: Within Functional Limits for tasks assessed                                               Pertinent Vitals/Pain Pain Assessment: Faces Faces Pain Scale: Hurts even more Pain Location: headache, neck pain Pain Descriptors / Indicators: Aching Pain Intervention(s): Limited activity within patient's tolerance;Monitored during session;Premedicated before session;Repositioned    Home Living Family/patient expects to be discharged to:: Private residence Living Arrangements: Children;Non-relatives/Friends(son and daughter in law) Available Help at Discharge: Family;Available PRN/intermittently(90% of day) Type of Home: House Home Access: Stairs to enter Entrance Stairs-Rails: Right;Left Home Layout: One level Home Equipment: Walker - standard;Cane - single point;Hand held shower head;Grab bars - tub/shower;Bedside commode;Tub bench Additional Comments: pt lives with his son and son's wife     Prior Function Level of Independence: Independent with assistive device(s)      Comments: reports using cane for ambulation PTA.  Sometimes he will use the walker if he feels bad.    PT Goals (current goals can now be found in the care plan section) Acute Rehab PT Goals Patient Stated Goal: to get better PT Goal Formulation: With patient Time For Goal Achievement: 09/13/17 Potential to Achieve Goals: Good    Frequency    Min 3X/week      PT Plan         AM-PAC PT "6 Clicks" Daily Activity  Outcome Measure  Difficulty turning over in bed (including adjusting bedclothes, sheets and blankets)?: None Difficulty moving from lying on back to sitting on the side of the bed? : None Difficulty sitting down on and standing up from a chair with  arms (e.g., wheelchair, bedside commode, etc,.)?: None Help needed moving to and from a bed to chair (including a wheelchair)?: A Little Help needed walking in hospital room?: A Little Help needed climbing 3-5 steps with a railing? : A Little 6 Click Score: 21    End of Session   Activity Tolerance: Patient limited by fatigue Patient left: in bed;with call bell/phone within reach;with bed alarm set Nurse Communication: Mobility status PT Visit Diagnosis: Muscle weakness (generalized) (M62.81);Difficulty in walking, not elsewhere classified (R26.2)     Time: 1610-9604 PT Time Calculation (min) (ACUTE ONLY): 51 min  Charges:  $Gait Training: 8-22 mins $Therapeutic Activity: 8-22 mins    Maricsa Sammons B. Coleman, [place], DPT [phone]           08/30/2017, 5:38 PM

## 2017-08-30 NOTE — Anesthesia Postprocedure Evaluation (Signed)
Anesthesia Post Note  Patient: Jerry [name]  Procedure(s) Performed: MRI WITH ANESTHESIA (N/A )     Patient location during evaluation: PACU Anesthesia Type: General Level of consciousness: sedated and patient cooperative Pain management: pain level controlled Vital Signs Assessment: post-procedure vital signs reviewed and stable Respiratory status: spontaneous breathing Cardiovascular status: stable Anesthetic complications: no    Last Vitals:  Vitals:   08/29/17 2350 08/30/17 0431  BP: (!) 117/52 (!) 129/51  Pulse: 71 78  Resp: 18 18  Temp: 36.7 C 36.9 C  SpO2: 95% 97%    Last Pain:  Vitals:   08/30/17 0700  TempSrc:   PainSc: [place]

## 2017-08-30 NOTE — Progress Notes (Signed)
PROGRESS NOTE  Jerry [name] JIR:[id number] DOB: [birth year] DOA: 08/28/2017 PCP: Aletha Halim., PA-C  HPI/Recap of past 24 hours: [name] a 67 y.o.malewith medical history significant ofchronic pain, HTN, HLD, tobacco abuse who comes in with a 4-5 day history of dizziness and feeling of "passing out". He is unable to provide information of room spinning or unsteadiness with position changes --- he says he is "just dizzy" and that "something is wrong with me". Patient denies seeing spots but does say his vision has been getting blurry. He went to see his PCP and she sent him to the ER for an evaluation. He reports increasing leg swelling b/l. But has not been wearing his TED hose He also reported some chest discomfort and left arm tightness to the ER doctor. No new medications or change in medications. Denies sick contacts. Lives with son and daughter in law. Reports increased thirst and weight loss of 45lbs in the last 10 weeks. Recently in the hospital in December with back pain and d/c'd on steroids.  Today, pt still reported feeling dizzy, especially when standing from a seated position and on ambulation. Still c/o mild back pain. Reports constipation. Denies any focal neurologic deficit, chest pain, SOB, abdominal pain, fever/chills.   Assessment/Plan: Active Problems:   Chronic pain syndrome   Morbid obesity (HCC)   Tobacco abuse   Debilitated   Essential hypertension   HLD (hyperlipidemia)   Anxiety   Dizziness  Dizziness Ongoing, no improvement as per pt ??BPPV, CVA ruled out with MRI/MRA MRI/MRA brain negative for CVA ECHO showed LVEF of 55-60% with grade 1 DD POC trop neg, EKG, no acute ST changes FLP: 70/HgbA1C: 5.8 Daily orthostatics, PT vestibular May consider a trial of meclizine  PT/OT Monitor closely  Chronic back pain Stable MRI lumbar/thoracic spine showed: Morphine pump/infusion catheter position unchanged compared to prior CT.  Thoracic spine MRI showed scattered small thoracic vertebral marrow lesions with varying enhancement. These may represent atypical hemangiomas, however metastatic disease is possible given the patient's history of malignancy. May consider nuclear medicine whole-body bone scan to evaluate for osseous metastatic disease. PCP to consider outpt PET scan for further evaluation   Chronic diastolic HF Reports chronic LE edema ECHO as above Continue home PO lasix  Bradycardia Resolved  Tobacco abuse h/o Advised on cessation  HLD Continue statin LDL 70  HTN Stable Held doxazosin, may cause dizziness   Code Status: Full  Family Communication: Daughter in law at bedside  Disposition Plan: Home once stable   Consultants:  None  Procedures:  None  Antimicrobials:  None  DVT prophylaxis:  Lovenox   Objective: Vitals:   08/30/17 0431 08/30/17 0500 08/30/17 0504 08/30/17 0902  BP: (!) 129/51     Pulse: 78     Resp: 18     Temp: 98.4 F (36.9 C)     TempSrc: Oral     SpO2: 97%   96%  Weight:  72.6 kg (160 lb)    Height:   6\' 1"  (1.854 m)     Intake/Output Summary (Last 24 hours) at 08/30/2017 1442 Last data filed at 08/30/2017 1257 Gross per 24 hour  Intake 50 ml  Output 1000 ml  Net -950 ml   Filed Weights   08/30/17 0500  Weight: 72.6 kg (160 lb)    Exam:   General: Awake, alert, oriented, not in acute distress  Cardiovascular: S1, S2 present, no added heart sounds  Respiratory: Chest clear bilaterally  Abdomen: Soft, nontender, nondistended, bowel sounds present  Musculoskeletal: Pedal edema present  Skin: Normal  Psychiatry: Normal mood   Data Reviewed: CBC: Recent Labs  Lab 08/28/17 0935  WBC 8.4  HGB 12.9*  HCT 39.8  MCV 93.4  PLT 099   Basic Metabolic Panel: Recent Labs  Lab 08/28/17 0935  NA 138  K 3.6  CL 95*  CO2 29  GLUCOSE 134*  BUN 8  CREATININE 1.02  CALCIUM 9.4   GFR: Estimated Creatinine Clearance: 73.2  mL/min (by C-G formula based on SCr of 1.02 mg/dL). Liver Function Tests: Recent Labs  Lab 08/28/17 0935  AST 24  ALT 31  ALKPHOS 62  BILITOT 0.8  PROT 6.9  ALBUMIN 3.7   Recent Labs  Lab 08/28/17 0935  LIPASE 22   No results for input(s): AMMONIA in the last 168 hours. Coagulation Profile: No results for input(s): INR, PROTIME in the last 168 hours. Cardiac Enzymes: No results for input(s): CKTOTAL, CKMB, CKMBINDEX, TROPONINI in the last 168 hours. BNP (last 3 results) No results for input(s): PROBNP in the last 8760 hours. HbA1C: Recent Labs    08/29/17 1048  HGBA1C 5.8*   CBG: No results for input(s): GLUCAP in the last 168 hours. Lipid Profile: Recent Labs    08/29/17 1048  CHOL 142  HDL 40*  LDLCALC 70  TRIG 162*  CHOLHDL 3.6   Thyroid Function Tests: No results for input(s): TSH, T4TOTAL, FREET4, T3FREE, THYROIDAB in the last 72 hours. Anemia Panel: No results for input(s): VITAMINB12, FOLATE, FERRITIN, TIBC, IRON, RETICCTPCT in the last 72 hours. Urine analysis:    Component Value Date/Time   COLORURINE YELLOW 08/28/2017 1340   APPEARANCEUR CLEAR 08/28/2017 1340   LABSPEC 1.005 08/28/2017 1340   PHURINE 8.0 08/28/2017 1340   GLUCOSEU NEGATIVE 08/28/2017 1340   HGBUR NEGATIVE 08/28/2017 1340   BILIRUBINUR NEGATIVE 08/28/2017 1340   KETONESUR NEGATIVE 08/28/2017 1340   PROTEINUR NEGATIVE 08/28/2017 1340   UROBILINOGEN 1.0 06/10/2015 1106   NITRITE NEGATIVE 08/28/2017 1340   LEUKOCYTESUR NEGATIVE 08/28/2017 1340   Sepsis Labs: @LABRCNTIP (procalcitonin:4,lacticidven:4)  )No results found for this or any previous visit (from the past 240 hour(s)).    Studies: Mr Brain 54 Contrast  Result Date: 08/29/2017 CLINICAL DATA:  67 year old male with dizziness and presyncope feeling for 4-5 days. Visual changes including blurry vision and seeing spots. Study performed under general anesthesia. EXAM: MRI HEAD WITHOUT CONTRAST MRA HEAD WITHOUT CONTRAST  TECHNIQUE: Multiplanar, multiecho pulse sequences of the brain and surrounding structures were obtained without intravenous contrast. Angiographic images of the head were obtained using MRA technique without contrast. COMPARISON:  No prior head or brain imaging. FINDINGS: MRI HEAD FINDINGS Brain: Cerebral volume is within normal limits for age. No restricted diffusion to suggest acute infarction. No midline shift, mass effect, evidence of mass lesion, ventriculomegaly, extra-axial collection or acute intracranial hemorrhage. Cervicomedullary junction and pituitary are within normal limits. Pearline Cables and white matter signal is within normal limits for age throughout the brain. No cortical encephalomalacia or chronic cerebral blood products. The deep gray matter nuclei, brainstem, and cerebellum appear normal. Vascular: Major intracranial vascular flow voids are preserved. Skull and upper cervical spine: Partially visible cervical spine degeneration, including disc and endplate degeneration at C3-C4 which appears to result in mild degenerative spinal stenosis. Normal bone marrow signal. Sinuses/Orbits: Normal optic chiasm. Normal noncontrast appearance of the cavernous sinus. Bilateral optic nerves and bilateral intraorbital soft tissues appear symmetric and normal. The paranasal sinuses are clear.  Other: There is fluid layering in the pharynx; this study was performed under general anesthesia, and the patient is intubated. There is trace bilateral mastoid air cell fluid. Visible internal auditory structures appear normal. Scalp and face soft tissues appear negative. MRA HEAD FINDINGS Antegrade flow in the posterior circulation with codominant distal vertebral arteries. The left PICA origin is normal. Some of the right PICA branches are visible and appear patent. Patent vertebrobasilar junction. Patent basilar artery without stenosis. Bilateral AICA, SCA, and PCA origins are normal. Small right posterior communicating  artery is present, the left is more diminutive or absent. Bilateral PCA branches are within normal limits, mild tortuosity of the left P1 segment. Antegrade flow in both ICA siphons. No siphon stenosis. Normal ophthalmic and right posterior communicating artery origins. Normal carotid termini. Normal MCA and ACA origins. Anterior communicating artery and visible bilateral ACA branches are normal. Bilateral MCA M1 segments, bilateral MCA bifurcations, and visible bilateral MCA branches appear normal. IMPRESSION: 1. No acute intracranial abnormality and normal for age noncontrast MRI appearance of the brain. 2. Normal intracranial MRA. Electronically Signed   By: Genevie Ann M.D.   On: 08/29/2017 15:34   Mr Thoracic Spine W Contrast  Result Date: 08/29/2017 CLINICAL DATA:  Chronic back pain. Concern for malpositioning of spinal infusion pump catheter. History of rib osteosarcoma. EXAM: MRI THORACIC SPINE WITH CONTRAST MRI LUMBAR SPINE WITHOUT AND WITH CONTRAST TECHNIQUE: Multiplanar and multiecho pulse sequences of the thoracic spine were obtained with intravenous contrast. Multiplanar and multiecho pulse sequences of the lumbar spine were obtained without and with intravenous contrast. CONTRAST:  59mL MULTIHANCE GADOBENATE DIMEGLUMINE 529 MG/ML IV SOLN COMPARISON:  Lumbar spine CT 07/14/2017.  Chest CT 07/11/2017. FINDINGS: The examination was performed under general anesthesia. Only a lumbar spine MRI was initially requested, however after consultation with the ordering physician a tailored thoracic spine MRI was also performed for assessment of the infusion catheter. IV contrast had already been administered for the lumbar portion of the examination, therefore the thoracic study consists of sagittal and axial T2, fat saturated sagittal T1, and non fat saturated axial T1 images. Axial T1 images were only obtained through the lower thoracic spine in the area of interest. No precontrast thoracic spine imaging was  obtained. MRI THORACIC SPINE FINDINGS Alignment: Slight levoconvex thoracic curvature. Straightening of the normal kyphosis in the mid and lower thoracic spine. No listhesis. Vertebrae: Unchanged mild chronic T6 superior endplate compression fracture/Schmorl's node deformity and mild chronic T11 and T12 compression fractures. Scattered small T2 hyperintense lesions most notable in the T4 and T10 vertebral bodies demonstrating enhancement varying from none to complete. Cord: Normal signal and morphology. Infusion catheter enters the thecal sac on the left at T11-12 and courses superiorly. An additional catheter extends superiorly from the lumbar spine. The comparison chest CT demonstrates 2 catheters terminating at the T8-9 and T9-10 disc space levels with a grossly unchanged appearance on today's MRI. No abnormal intradural enhancement. No epidural fluid collection Paraspinal and other soft tissues: Unremarkable. Disc levels: Mild multilevel thoracic disc degeneration and facet arthrosis. Small right central disc protrusion at T3-4, minimal disc bulging at T6-7, small right paracentral disc protrusion at T7-8, and mild disc bulging at T9-10 and T10-11 without significant spinal stenosis or spinal cord mass effect. No compressive neural foraminal stenosis. MRI LUMBAR SPINE FINDINGS Segmentation:  Standard. Alignment:  Chronic lumbar spine straightening.  No listhesis. Vertebrae: Unchanged chronic moderate L2 compression fracture. No acute fracture or suspicious osseous lesion. Conus medullaris:  Extends to the L1-2 level and appears normal. A catheter enters the thecal sac at the L4 level and courses superiorly into the thoracic spine. Paraspinal and other soft tissues: Bilateral paraspinal muscle atrophy. Moderate bladder distention. Disc levels: L1-2: Predominantly anterior spondylosis.  No stenosis. L2-3: Mild disc bulging and moderate facet and ligamentum flavum hypertrophy result in mild spinal stenosis, similar  to the prior CT. No neural foraminal stenosis. L3-4 through L5-S1: Prior posterior decompression with solid interbody and posterior element osseous fusion. Widely patent spinal canal. Mild osseous neural foraminal narrowing on the right at L5-S1 with minimal narrowing on the left at L4-5 and L5-S1. IMPRESSION: 1. Intrathecal infusion catheters terminating in the lower thoracic spine as above. 2. Prior L3-S1 fusion with widely patent spinal canal. 3. Unchanged mild spinal stenosis at L2-3. 4. Mild thoracic disc and facet degeneration without significant stenosis. 5. Scattered small thoracic vertebral marrow lesions with varying enhancement. These may represent atypical hemangiomas, however metastatic disease is possible given the patient's history of malignancy. If the patient has ever had a thoracic spine MRI elsewhere, comparison with this would be most useful to assess stability. Otherwise, consider nuclear medicine whole-body bone scan to evaluate for osseous metastatic disease. Electronically Signed   By: Logan Bores M.D.   On: 08/29/2017 15:57   Mr Lumbar Spine W Wo Contrast  Result Date: 08/29/2017 CLINICAL DATA:  Chronic back pain. Concern for malpositioning of spinal infusion pump catheter. History of rib osteosarcoma. EXAM: MRI THORACIC SPINE WITH CONTRAST MRI LUMBAR SPINE WITHOUT AND WITH CONTRAST TECHNIQUE: Multiplanar and multiecho pulse sequences of the thoracic spine were obtained with intravenous contrast. Multiplanar and multiecho pulse sequences of the lumbar spine were obtained without and with intravenous contrast. CONTRAST:  51mL MULTIHANCE GADOBENATE DIMEGLUMINE 529 MG/ML IV SOLN COMPARISON:  Lumbar spine CT 07/14/2017.  Chest CT 07/11/2017. FINDINGS: The examination was performed under general anesthesia. Only a lumbar spine MRI was initially requested, however after consultation with the ordering physician a tailored thoracic spine MRI was also performed for assessment of the infusion  catheter. IV contrast had already been administered for the lumbar portion of the examination, therefore the thoracic study consists of sagittal and axial T2, fat saturated sagittal T1, and non fat saturated axial T1 images. Axial T1 images were only obtained through the lower thoracic spine in the area of interest. No precontrast thoracic spine imaging was obtained. MRI THORACIC SPINE FINDINGS Alignment: Slight levoconvex thoracic curvature. Straightening of the normal kyphosis in the mid and lower thoracic spine. No listhesis. Vertebrae: Unchanged mild chronic T6 superior endplate compression fracture/Schmorl's node deformity and mild chronic T11 and T12 compression fractures. Scattered small T2 hyperintense lesions most notable in the T4 and T10 vertebral bodies demonstrating enhancement varying from none to complete. Cord: Normal signal and morphology. Infusion catheter enters the thecal sac on the left at T11-12 and courses superiorly. An additional catheter extends superiorly from the lumbar spine. The comparison chest CT demonstrates 2 catheters terminating at the T8-9 and T9-10 disc space levels with a grossly unchanged appearance on today's MRI. No abnormal intradural enhancement. No epidural fluid collection Paraspinal and other soft tissues: Unremarkable. Disc levels: Mild multilevel thoracic disc degeneration and facet arthrosis. Small right central disc protrusion at T3-4, minimal disc bulging at T6-7, small right paracentral disc protrusion at T7-8, and mild disc bulging at T9-10 and T10-11 without significant spinal stenosis or spinal cord mass effect. No compressive neural foraminal stenosis. MRI LUMBAR SPINE FINDINGS Segmentation:  Standard. Alignment:  Chronic lumbar spine straightening.  No listhesis. Vertebrae: Unchanged chronic moderate L2 compression fracture. No acute fracture or suspicious osseous lesion. Conus medullaris: Extends to the L1-2 level and appears normal. A catheter enters the  thecal sac at the L4 level and courses superiorly into the thoracic spine. Paraspinal and other soft tissues: Bilateral paraspinal muscle atrophy. Moderate bladder distention. Disc levels: L1-2: Predominantly anterior spondylosis.  No stenosis. L2-3: Mild disc bulging and moderate facet and ligamentum flavum hypertrophy result in mild spinal stenosis, similar to the prior CT. No neural foraminal stenosis. L3-4 through L5-S1: Prior posterior decompression with solid interbody and posterior element osseous fusion. Widely patent spinal canal. Mild osseous neural foraminal narrowing on the right at L5-S1 with minimal narrowing on the left at L4-5 and L5-S1. IMPRESSION: 1. Intrathecal infusion catheters terminating in the lower thoracic spine as above. 2. Prior L3-S1 fusion with widely patent spinal canal. 3. Unchanged mild spinal stenosis at L2-3. 4. Mild thoracic disc and facet degeneration without significant stenosis. 5. Scattered small thoracic vertebral marrow lesions with varying enhancement. These may represent atypical hemangiomas, however metastatic disease is possible given the patient's history of malignancy. If the patient has ever had a thoracic spine MRI elsewhere, comparison with this would be most useful to assess stability. Otherwise, consider nuclear medicine whole-body bone scan to evaluate for osseous metastatic disease. Electronically Signed   By: Logan Bores M.D.   On: 08/29/2017 15:57   Mr Virgel Paling KD Contrast  Result Date: 08/29/2017 CLINICAL DATA:  67 year old male with dizziness and presyncope feeling for 4-5 days. Visual changes including blurry vision and seeing spots. Study performed under general anesthesia. EXAM: MRI HEAD WITHOUT CONTRAST MRA HEAD WITHOUT CONTRAST TECHNIQUE: Multiplanar, multiecho pulse sequences of the brain and surrounding structures were obtained without intravenous contrast. Angiographic images of the head were obtained using MRA technique without contrast.  COMPARISON:  No prior head or brain imaging. FINDINGS: MRI HEAD FINDINGS Brain: Cerebral volume is within normal limits for age. No restricted diffusion to suggest acute infarction. No midline shift, mass effect, evidence of mass lesion, ventriculomegaly, extra-axial collection or acute intracranial hemorrhage. Cervicomedullary junction and pituitary are within normal limits. Pearline Cables and white matter signal is within normal limits for age throughout the brain. No cortical encephalomalacia or chronic cerebral blood products. The deep gray matter nuclei, brainstem, and cerebellum appear normal. Vascular: Major intracranial vascular flow voids are preserved. Skull and upper cervical spine: Partially visible cervical spine degeneration, including disc and endplate degeneration at C3-C4 which appears to result in mild degenerative spinal stenosis. Normal bone marrow signal. Sinuses/Orbits: Normal optic chiasm. Normal noncontrast appearance of the cavernous sinus. Bilateral optic nerves and bilateral intraorbital soft tissues appear symmetric and normal. The paranasal sinuses are clear. Other: There is fluid layering in the pharynx; this study was performed under general anesthesia, and the patient is intubated. There is trace bilateral mastoid air cell fluid. Visible internal auditory structures appear normal. Scalp and face soft tissues appear negative. MRA HEAD FINDINGS Antegrade flow in the posterior circulation with codominant distal vertebral arteries. The left PICA origin is normal. Some of the right PICA branches are visible and appear patent. Patent vertebrobasilar junction. Patent basilar artery without stenosis. Bilateral AICA, SCA, and PCA origins are normal. Small right posterior communicating artery is present, the left is more diminutive or absent. Bilateral PCA branches are within normal limits, mild tortuosity of the left P1 segment. Antegrade flow in both ICA siphons. No siphon stenosis. Normal ophthalmic  and  right posterior communicating artery origins. Normal carotid termini. Normal MCA and ACA origins. Anterior communicating artery and visible bilateral ACA branches are normal. Bilateral MCA M1 segments, bilateral MCA bifurcations, and visible bilateral MCA branches appear normal. IMPRESSION: 1. No acute intracranial abnormality and normal for age noncontrast MRI appearance of the brain. 2. Normal intracranial MRA. Electronically Signed   By: Genevie Ann M.D.   On: 08/29/2017 15:34    Scheduled Meds: . aspirin EC  81 mg Oral Daily  . cholecalciferol  1,000 Units Oral Daily  . doxazosin  1 mg Oral QPM  . enoxaparin (LOVENOX) injection  40 mg Subcutaneous Q24H  . feeding supplement (ENSURE ENLIVE)  237 mL Oral BID BM  . ferrous sulfate  325 mg Oral Q breakfast  . furosemide  10 mg Oral q1800  . furosemide  20 mg Oral QAC breakfast  . mometasone-formoterol  2 puff Inhalation BID  . multivitamin with minerals  1 tablet Oral Daily  . pantoprazole  40 mg Oral BID  . polyethylene glycol  17 g Oral Daily  . potassium chloride  20 mEq Oral Daily  . pravastatin  20 mg Oral q1800    Continuous Infusions:   LOS: 0 days     Alma Friendly, MD [facility]   If 7PM-7AM, please contact night-coverage [email or web address] Password [email or web address] 08/30/2017, 2:42 PM

## 2017-08-31 DIAGNOSIS — R42 Dizziness and giddiness: Secondary | ICD-10-CM | POA: Diagnosis not present

## 2017-08-31 DIAGNOSIS — R5381 Other malaise: Secondary | ICD-10-CM | POA: Diagnosis present

## 2017-08-31 DIAGNOSIS — E860 Dehydration: Secondary | ICD-10-CM | POA: Diagnosis present

## 2017-08-31 DIAGNOSIS — R634 Abnormal weight loss: Secondary | ICD-10-CM | POA: Diagnosis present

## 2017-08-31 DIAGNOSIS — C7951 Secondary malignant neoplasm of bone: Secondary | ICD-10-CM | POA: Diagnosis present

## 2017-08-31 DIAGNOSIS — I2510 Atherosclerotic heart disease of native coronary artery without angina pectoris: Secondary | ICD-10-CM | POA: Diagnosis present

## 2017-08-31 DIAGNOSIS — I110 Hypertensive heart disease with heart failure: Secondary | ICD-10-CM | POA: Diagnosis present

## 2017-08-31 DIAGNOSIS — E785 Hyperlipidemia, unspecified: Secondary | ICD-10-CM | POA: Diagnosis present

## 2017-08-31 DIAGNOSIS — G894 Chronic pain syndrome: Secondary | ICD-10-CM | POA: Diagnosis present

## 2017-08-31 DIAGNOSIS — E861 Hypovolemia: Secondary | ICD-10-CM | POA: Diagnosis present

## 2017-08-31 DIAGNOSIS — N400 Enlarged prostate without lower urinary tract symptoms: Secondary | ICD-10-CM | POA: Diagnosis present

## 2017-08-31 DIAGNOSIS — Z96651 Presence of right artificial knee joint: Secondary | ICD-10-CM | POA: Diagnosis present

## 2017-08-31 DIAGNOSIS — D649 Anemia, unspecified: Secondary | ICD-10-CM | POA: Diagnosis present

## 2017-08-31 DIAGNOSIS — K5900 Constipation, unspecified: Secondary | ICD-10-CM | POA: Diagnosis present

## 2017-08-31 DIAGNOSIS — R55 Syncope and collapse: Secondary | ICD-10-CM | POA: Diagnosis not present

## 2017-08-31 DIAGNOSIS — I951 Orthostatic hypotension: Secondary | ICD-10-CM | POA: Diagnosis present

## 2017-08-31 DIAGNOSIS — K219 Gastro-esophageal reflux disease without esophagitis: Secondary | ICD-10-CM | POA: Diagnosis present

## 2017-08-31 DIAGNOSIS — F419 Anxiety disorder, unspecified: Secondary | ICD-10-CM | POA: Diagnosis not present

## 2017-08-31 DIAGNOSIS — E7800 Pure hypercholesterolemia, unspecified: Secondary | ICD-10-CM | POA: Diagnosis not present

## 2017-08-31 DIAGNOSIS — R531 Weakness: Secondary | ICD-10-CM | POA: Diagnosis present

## 2017-08-31 DIAGNOSIS — R402413 Glasgow coma scale score 13-15, at hospital admission: Secondary | ICD-10-CM | POA: Diagnosis present

## 2017-08-31 DIAGNOSIS — M1711 Unilateral primary osteoarthritis, right knee: Secondary | ICD-10-CM | POA: Diagnosis present

## 2017-08-31 DIAGNOSIS — M545 Low back pain: Secondary | ICD-10-CM | POA: Diagnosis present

## 2017-08-31 DIAGNOSIS — I5032 Chronic diastolic (congestive) heart failure: Secondary | ICD-10-CM | POA: Diagnosis present

## 2017-08-31 DIAGNOSIS — G709 Myoneural disorder, unspecified: Secondary | ICD-10-CM | POA: Diagnosis present

## 2017-08-31 DIAGNOSIS — I10 Essential (primary) hypertension: Secondary | ICD-10-CM | POA: Diagnosis not present

## 2017-08-31 DIAGNOSIS — R29700 NIHSS score 0: Secondary | ICD-10-CM | POA: Diagnosis present

## 2017-08-31 DIAGNOSIS — R29701 NIHSS score 1: Secondary | ICD-10-CM | POA: Diagnosis not present

## 2017-08-31 DIAGNOSIS — R000 Tachycardia, unspecified: Secondary | ICD-10-CM | POA: Diagnosis present

## 2017-08-31 LAB — BASIC METABOLIC PANEL
Anion gap: 10 (ref 5–15)
BUN: 5 mg/dL — AB (ref 6–20)
CHLORIDE: 99 mmol/L — AB (ref 101–111)
CO2: 27 mmol/L (ref 22–32)
Calcium: 8.9 mg/dL (ref 8.9–10.3)
Creatinine, Ser: 0.85 mg/dL (ref 0.61–1.24)
GFR calc Af Amer: >60 mL/min (ref >60)
GFR calc non Af Amer: >60 mL/min (ref >60)
GLUCOSE: 125 mg/dL — AB (ref 65–99)
POTASSIUM: 3.9 mmol/L (ref 3.5–5.1)
SODIUM: 136 mmol/L (ref 135–145)

## 2017-08-31 MED ORDER — POLYETHYLENE GLYCOL 3350 17 G PO PACK
17.0000 g | PACK | Freq: Two times a day (BID) | ORAL | Status: DC
  Administered 2017-08-31 – 2017-09-02 (×4): 17 g via ORAL
  Filled 2017-08-31 (×4): qty 1

## 2017-08-31 MED ORDER — BISACODYL 10 MG RE SUPP
10.0000 mg | Freq: Every day | RECTAL | Status: DC
  Administered 2017-09-01: 10 mg via RECTAL
  Filled 2017-08-31 (×2): qty 1

## 2017-08-31 MED ORDER — SODIUM CHLORIDE 0.9 % IV SOLN
INTRAVENOUS | Status: AC
  Administered 2017-08-31 – 2017-09-01 (×2): via INTRAVENOUS

## 2017-08-31 MED ORDER — SENNOSIDES-DOCUSATE SODIUM 8.6-50 MG PO TABS
1.0000 | ORAL_TABLET | Freq: Two times a day (BID) | ORAL | Status: DC
  Administered 2017-08-31 – 2017-09-02 (×4): 1 via ORAL
  Filled 2017-08-31 (×4): qty 1

## 2017-08-31 MED ORDER — NICOTINE 14 MG/24HR TD PT24
14.0000 mg | MEDICATED_PATCH | Freq: Every day | TRANSDERMAL | Status: DC
Start: 2017-08-31 — End: 2017-09-02
  Filled 2017-08-31: qty 1

## 2017-08-31 NOTE — Progress Notes (Signed)
Physical Therapy Treatment Patient Details Name: Jerry [name] MRN: [id number] DOB: [birth year] Today's Date: 08/31/2017    History of Present Illness 67 y.o. male admitted on 08/28/17 for dizziness.  MRI negative for CVA.  MD suspicious of vestibular dysfunction.  Pt also being treated for chronic diastolic CHF.  Pt with significant PMH of HTN, CAD, chronic back pain, anxiety, anemia, bil TKA, and back surgery.    PT Comments    Pt was seen for evaluation of his mobility and to ck heart rates with all movement.  He increased from 74 at rest to 100 but then recovered quickly.  Pt is concerned that he does not get meds to manage HR and declined to stress his system by going for a longer walk.  He will remain on acute list to work on his strength and standing control, and to progress his gait a longer distance with RW.   Follow Up Recommendations  Home health PT;Supervision - Intermittent     Equipment Recommendations  None recommended by PT    Recommendations for Other Services       Precautions / Restrictions Precautions Precautions: Fall(telemetry) Precaution Comments: h/o falls and LE weakness Restrictions Weight Bearing Restrictions: No    Mobility  Bed Mobility Overal bed mobility: Modified Independent Bed Mobility: Supine to Sit;Sit to Supine           General bed mobility comments: has bed set to not lie flat  Transfers Overall transfer level: Needs assistance Equipment used: Rolling walker (2 wheeled) Transfers: Sit to/from Stand Sit to Stand: Min assist;Min guard Stand pivot transfers: Min assist;Min guard          Ambulation/Gait Ambulation/Gait assistance: Min guard Ambulation Distance (Feet): 80 Feet(40 x 2) Assistive device: Rolling walker (2 wheeled) Gait Pattern/deviations: Step-through pattern;Decreased stride length;Wide base of support Gait velocity: decreased Gait velocity interpretation: Below normal speed for age/gender General Gait  Details: pt has much more controlled HR to max of 100 but then down to low 90's quickly   Stairs            Wheelchair Mobility    Modified Rankin (Stroke Patients Only)       Balance Overall balance assessment: Needs assistance Sitting-balance support: Feet supported Sitting balance-Leahy Scale: Good     Standing balance support: Bilateral upper extremity supported;During functional activity Standing balance-Leahy Scale: Fair Standing balance comment: walker needed to walk but is minimal support to stand                            Cognition Arousal/Alertness: Awake/alert Behavior During Therapy: WFL for tasks assessed/performed Overall Cognitive Status: Within Functional Limits for tasks assessed                                        Exercises      General Comments        Pertinent Vitals/Pain Pain Assessment: No/denies pain    Home Living                      Prior Function            PT Goals (current goals can now be found in the care plan section) Progress towards PT goals: Progressing toward goals    Frequency    Min 3X/week      PT  Plan Current plan remains appropriate    Co-evaluation              AM-PAC PT "6 Clicks" Daily Activity  Outcome Measure  Difficulty turning over in bed (including adjusting bedclothes, sheets and blankets)?: None Difficulty moving from lying on back to sitting on the side of the bed? : None Difficulty sitting down on and standing up from a chair with arms (e.g., wheelchair, bedside commode, etc,.)?: None Help needed moving to and from a bed to chair (including a wheelchair)?: A Little Help needed walking in hospital room?: A Little Help needed climbing 3-5 steps with a railing? : A Little 6 Click Score: 21    End of Session Equipment Utilized During Treatment: Gait belt Activity Tolerance: Patient tolerated treatment well;Patient limited by fatigue Patient  left: in bed;with call bell/phone within reach;with bed alarm set;with family/visitor present Nurse Communication: Mobility status PT Visit Diagnosis: Muscle weakness (generalized) (M62.81);Difficulty in walking, not elsewhere classified (R26.2)     Time: 8588-5027 PT Time Calculation (min) (ACUTE ONLY): 33 min  Charges:  $Gait Training: 8-22 mins $Therapeutic Activity: 8-22 mins                    G Codes:  Functional Assessment Tool Used: AM-PAC 6 Clicks Basic Mobility     Ramond Dial 08/31/2017, 12:01 PM   Mee Hives, PT MS Acute Rehab Dept. Number: [place] [facility] [place]

## 2017-08-31 NOTE — Progress Notes (Signed)
PROGRESS NOTE  Jerry [name] VEH:[id number] DOB: 12/15/1950 DOA: 08/28/2017 PCP: Aletha Halim., PA-C  HPI/Recap of past 24 hours: Jerry [name] a 67 y.o.malewith medical history significant ofchronic pain, HTN, HLD, tobacco abuse who comes in with a 4-5 day history of dizziness and feeling of "passing out". He is unable to provide information of room spinning or unsteadiness with position changes --- he says he is "just dizzy" and that "something is wrong with me". Patient denies seeing spots but does say his vision has been getting blurry. He went to see his PCP and she sent him to the ER for an evaluation. He reports increasing leg swelling b/l. But has not been wearing his TED hose He also reported some chest discomfort and left arm tightness to the ER doctor. No new medications or change in medications. Denies sick contacts. Lives with son and daughter in law. Reports increased thirst and weight loss of 45lbs in the last 10 weeks. Recently in the hospital in December with back pain and d/c'd on steroids.  Today, patient still complained of dizziness, reports unchanged since admission.  Dizziness worse when he is standing and ambulating, feels like he is going to pass out.  Unable to ambulate for long distances due to dizziness.  Heart rate noted to rise above 120s upon ambulation.  Patient denies any chest pain, shortness of breath, abdominal pain, nausea/vomiting/diarrhea, fever/chills.  Patient reported constipation.   Assessment/Plan: Active Problems:   Chronic pain syndrome   Morbid obesity (HCC)   Tobacco abuse   Debilitated   Essential hypertension   HLD (hyperlipidemia)   Anxiety   Dizziness  Dizziness, ??POTS Vs Med induced Very symptomatic upon ambulation, with HR >120 Ongoing, no improvement as per pt Orthostatics have been negative, just increased HR, BP remains unchanged MRI/MRA brain negative for CVA ECHO showed LVEF of 55-60% with grade 1 DD POC  trop neg, EKG, no acute ST changes FLP: 70, HgbA1C: 5.8 PT vestibular testing done, unlikely BPPV Discontinued PO doxazosin + PO lasix Will hydrate for 1 day and re-evaluate. May consult cardiology if persists  Continue PT Monitor closely  Chronic back pain Stable MRI lumbar/thoracic spine showed: Morphine pump/infusion catheter position unchanged compared to prior CT. Thoracic spine MRI showed scattered small thoracic vertebral marrow lesions with varying enhancement. These may represent atypical hemangiomas, however metastatic disease is possible given the patient's history of malignancy. May consider nuclear medicine whole-body bone scan to evaluate for osseous metastatic disease. PCP to consider outpt PET scan for further evaluation   Chronic diastolic HF Stable ECHO as above Held home PO lasix  Bradycardia Resolved  Tobacco abuse h/o Advised on cessation Nicotine patch  HLD Continue statin LDL 70  HTN Stable Held doxazosin, may cause dizziness   Code Status: Full  Family Communication: Daughter at bedside  Disposition Plan: Home once stable   Consultants:  None  Procedures:  None  Antimicrobials:  None  DVT prophylaxis:  Lovenox   Objective: Vitals:   08/31/17 0530 08/31/17 0845 08/31/17 0956 08/31/17 1232  BP: 121/64 130/63  133/64  Pulse: 74 73  80  Resp: 17   16  Temp: 98.4 F (36.9 C)   98.4 F (36.9 C)  TempSrc: Oral   Oral  SpO2: 94%  94% 98%  Weight:      Height:        Intake/Output Summary (Last 24 hours) at 08/31/2017 1606 Last data filed at 08/31/2017 1416 Gross per 24 hour  Intake 702 ml  Output 1200 ml  Net -498 ml   Filed Weights   08/30/17 0500  Weight: 72.6 kg (160 lb)    Exam:   General: Awake, alert, oriented, not in acute distress  Cardiovascular: S1, S2 present, no added heart sounds  Respiratory: Chest clear bilaterally  Abdomen: Soft, nontender, nondistended, bowel sounds  present  Musculoskeletal: No pedal edema present  Skin: Normal  Psychiatry: Normal mood   Data Reviewed: CBC: Recent Labs  Lab 08/28/17 0935  WBC 8.4  HGB 12.9*  HCT 39.8  MCV 93.4  PLT 465   Basic Metabolic Panel: Recent Labs  Lab 08/28/17 0935 08/31/17 0324  NA 138 136  K 3.6 3.9  CL 95* 99*  CO2 29 27  GLUCOSE 134* 125*  BUN 8 5*  CREATININE 1.02 0.85  CALCIUM 9.4 8.9   GFR: Estimated Creatinine Clearance: 87.8 mL/min (by C-G formula based on SCr of 0.85 mg/dL). Liver Function Tests: Recent Labs  Lab 08/28/17 0935  AST 24  ALT 31  ALKPHOS 62  BILITOT 0.8  PROT 6.9  ALBUMIN 3.7   Recent Labs  Lab 08/28/17 0935  LIPASE 22   No results for input(s): AMMONIA in the last 168 hours. Coagulation Profile: No results for input(s): INR, PROTIME in the last 168 hours. Cardiac Enzymes: No results for input(s): CKTOTAL, CKMB, CKMBINDEX, TROPONINI in the last 168 hours. BNP (last 3 results) No results for input(s): PROBNP in the last 8760 hours. HbA1C: Recent Labs    08/29/17 1048  HGBA1C 5.8*   CBG: No results for input(s): GLUCAP in the last 168 hours. Lipid Profile: Recent Labs    08/29/17 1048  CHOL 142  HDL 40*  LDLCALC 70  TRIG 162*  CHOLHDL 3.6   Thyroid Function Tests: No results for input(s): TSH, T4TOTAL, FREET4, T3FREE, THYROIDAB in the last 72 hours. Anemia Panel: No results for input(s): VITAMINB12, FOLATE, FERRITIN, TIBC, IRON, RETICCTPCT in the last 72 hours. Urine analysis:    Component Value Date/Time   COLORURINE YELLOW 08/28/2017 1340   APPEARANCEUR CLEAR 08/28/2017 1340   LABSPEC 1.005 08/28/2017 1340   PHURINE 8.0 08/28/2017 1340   GLUCOSEU NEGATIVE 08/28/2017 1340   HGBUR NEGATIVE 08/28/2017 1340   BILIRUBINUR NEGATIVE 08/28/2017 1340   KETONESUR NEGATIVE 08/28/2017 1340   PROTEINUR NEGATIVE 08/28/2017 1340   UROBILINOGEN 1.0 06/10/2015 1106   NITRITE NEGATIVE 08/28/2017 1340   LEUKOCYTESUR NEGATIVE 08/28/2017  1340   Sepsis Labs: @LABRCNTIP (procalcitonin:4,lacticidven:4)  )No results found for this or any previous visit (from the past 240 hour(s)).    Studies: No results found.  Scheduled Meds: . aspirin EC  81 mg Oral Daily  . [START ON 09/01/2017] bisacodyl  10 mg Rectal Daily  . cholecalciferol  1,000 Units Oral Daily  . enoxaparin (LOVENOX) injection  40 mg Subcutaneous Q24H  . feeding supplement (ENSURE ENLIVE)  237 mL Oral BID BM  . ferrous sulfate  325 mg Oral Q breakfast  . mometasone-formoterol  2 puff Inhalation BID  . multivitamin with minerals  1 tablet Oral Daily  . pantoprazole  40 mg Oral BID  . polyethylene glycol  17 g Oral BID  . potassium chloride  20 mEq Oral Daily  . pravastatin  20 mg Oral q1800  . senna-docusate  1 tablet Oral BID    Continuous Infusions: . sodium chloride       LOS: 0 days     Alma Friendly, MD [facility]   If 7PM-7AM,  please contact night-coverage [email or web address] Password [facility] 08/31/2017, 4:06 PM

## 2017-08-31 NOTE — Progress Notes (Signed)
Patient is having diarrhea. He does not want an enema right now.

## 2017-08-31 NOTE — Progress Notes (Signed)
Patient is alert and oriented, verbalized pain controlled with prn medication this shift. Patient slept for most part of the night, no acute distress noted

## 2017-09-01 DIAGNOSIS — R55 Syncope and collapse: Secondary | ICD-10-CM

## 2017-09-01 MED ORDER — TAMSULOSIN HCL 0.4 MG PO CAPS
0.4000 mg | ORAL_CAPSULE | Freq: Every day | ORAL | Status: DC
  Administered 2017-09-01 – 2017-09-02 (×2): 0.4 mg via ORAL
  Filled 2017-09-01 (×2): qty 1

## 2017-09-01 NOTE — Progress Notes (Signed)
PROGRESS NOTE  Jerry [name] NLG:[id number] DOB: 1951/03/29 DOA: 08/28/2017 PCP: Aletha Halim., PA-C  HPI/Recap of past 24 hours: Jerry [name] a 67 y.o.malewith medical history significant ofchronic pain, HTN, HLD, tobacco abuse who comes in with a 4-5 day history of dizziness and feeling of "passing out". He is unable to provide information of room spinning or unsteadiness with position changes --- he says he is "just dizzy" and that "something is wrong with me". Patient denies seeing spots but does say his vision has been getting blurry. He went to see his PCP and she sent him to the ER for an evaluation. He reports increasing leg swelling b/l. But has not been wearing his TED hose He also reported some chest discomfort and left arm tightness to the ER doctor. No new medications or change in medications. Denies sick contacts. Lives with son and daughter in law. Reports increased thirst and weight loss of 45lbs in the last 10 weeks. Recently in the hospital in December with back pain and d/c'd on steroids.  Today, patient still complained of dizziness, reports unchanged since admission.  Dizziness worse when he is standing and ambulating, feels like he is going to pass out.  Denies any chest pain, shortness of breath, nausea/vomiting/diarrhea/constipation, fever/chills.   Assessment/Plan: Active Problems:   Chronic pain syndrome   Morbid obesity (HCC)   Tobacco abuse   Debilitated   Essential hypertension   HLD (hyperlipidemia)   Anxiety   Dizziness  Dizziness, ??POTS Vs Med induced Very symptomatic upon ambulation, with HR >120 Ongoing, no improvement as per pt Orthostatics have been negative, just increased HR, BP remains unchanged MRI/MRA brain negative for CVA ECHO showed LVEF of 55-60% with grade 1 DD POC trop neg, EKG, no acute ST changes FLP: 70, HgbA1C: 5.8 PT vestibular testing done, unlikely BPPV Discontinued PO doxazosin + PO lasix Will continue  hydration and re-evaluate. May consult cardiology if persists  Continue PT Monitor closely  Chronic back pain Stable MRI lumbar/thoracic spine showed: Morphine pump/infusion catheter position unchanged compared to prior CT. Thoracic spine MRI showed scattered small thoracic vertebral marrow lesions with varying enhancement. These may represent atypical hemangiomas, however metastatic disease is possible given the patient's history of malignancy. May consider nuclear medicine whole-body bone scan to evaluate for osseous metastatic disease. PCP to consider outpt PET scan for further evaluation   Chronic diastolic HF Stable ECHO as above Held home PO lasix  Bradycardia Resolved  Tobacco abuse h/o Advised on cessation Nicotine patch  HLD Continue statin LDL 70  HTN Stable Held doxazosin, may cause dizziness   Code Status: Full  Family Communication: None at bedside  Disposition Plan: Home once stable   Consultants:  None  Procedures:  None  Antimicrobials:  None  DVT prophylaxis:  Lovenox   Objective: Vitals:   09/01/17 0845 09/01/17 0911 09/01/17 1245 09/01/17 1414  BP: 128/78  120/71 (!) 126/54  Pulse: 80  85 70  Resp: 16  18 18   Temp: 98.4 F (36.9 C)  98 F (36.7 C) 99 F (37.2 C)  TempSrc: Oral  Oral Oral  SpO2: 97% 96% 96% 94%  Weight:      Height:        Intake/Output Summary (Last 24 hours) at 09/01/2017 1537 Last data filed at 09/01/2017 1536 Gross per 24 hour  Intake 1550 ml  Output 1175 ml  Net 375 ml   Filed Weights   08/30/17 0500  Weight: 72.6 kg (160 lb)  Exam:   General: Awake, alert, oriented, not in acute distress  Cardiovascular: S1, S2 present, no added heart sounds  Respiratory: Chest clear bilaterally  Abdomen: Soft, nontender, nondistended, bowel sounds present  Musculoskeletal: No pedal edema present  Skin: Normal  Psychiatry: Normal mood   Data Reviewed: CBC: Recent Labs  Lab 08/28/17 0935    WBC 8.4  HGB 12.9*  HCT 39.8  MCV 93.4  PLT 500   Basic Metabolic Panel: Recent Labs  Lab 08/28/17 0935 08/31/17 0324  NA 138 136  K 3.6 3.9  CL 95* 99*  CO2 29 27  GLUCOSE 134* 125*  BUN 8 5*  CREATININE 1.02 0.85  CALCIUM 9.4 8.9   GFR: Estimated Creatinine Clearance: 87.8 mL/min (by C-G formula based on SCr of 0.85 mg/dL). Liver Function Tests: Recent Labs  Lab 08/28/17 0935  AST 24  ALT 31  ALKPHOS 62  BILITOT 0.8  PROT 6.9  ALBUMIN 3.7   Recent Labs  Lab 08/28/17 0935  LIPASE 22   No results for input(s): AMMONIA in the last 168 hours. Coagulation Profile: No results for input(s): INR, PROTIME in the last 168 hours. Cardiac Enzymes: No results for input(s): CKTOTAL, CKMB, CKMBINDEX, TROPONINI in the last 168 hours. BNP (last 3 results) No results for input(s): PROBNP in the last 8760 hours. HbA1C: No results for input(s): HGBA1C in the last 72 hours. CBG: No results for input(s): GLUCAP in the last 168 hours. Lipid Profile: No results for input(s): CHOL, HDL, LDLCALC, TRIG, CHOLHDL, LDLDIRECT in the last 72 hours. Thyroid Function Tests: No results for input(s): TSH, T4TOTAL, FREET4, T3FREE, THYROIDAB in the last 72 hours. Anemia Panel: No results for input(s): VITAMINB12, FOLATE, FERRITIN, TIBC, IRON, RETICCTPCT in the last 72 hours. Urine analysis:    Component Value Date/Time   COLORURINE YELLOW 08/28/2017 1340   APPEARANCEUR CLEAR 08/28/2017 1340   LABSPEC 1.005 08/28/2017 1340   PHURINE 8.0 08/28/2017 1340   GLUCOSEU NEGATIVE 08/28/2017 1340   HGBUR NEGATIVE 08/28/2017 1340   BILIRUBINUR NEGATIVE 08/28/2017 1340   KETONESUR NEGATIVE 08/28/2017 1340   PROTEINUR NEGATIVE 08/28/2017 1340   UROBILINOGEN 1.0 06/10/2015 1106   NITRITE NEGATIVE 08/28/2017 1340   LEUKOCYTESUR NEGATIVE 08/28/2017 1340   Sepsis Labs: @LABRCNTIP (procalcitonin:4,lacticidven:4)  )No results found for this or any previous visit (from the past 240 hour(s)).     Studies: No results found.  Scheduled Meds: . aspirin EC  81 mg Oral Daily  . bisacodyl  10 mg Rectal Daily  . cholecalciferol  1,000 Units Oral Daily  . enoxaparin (LOVENOX) injection  40 mg Subcutaneous Q24H  . feeding supplement (ENSURE ENLIVE)  237 mL Oral BID BM  . ferrous sulfate  325 mg Oral Q breakfast  . mometasone-formoterol  2 puff Inhalation BID  . multivitamin with minerals  1 tablet Oral Daily  . nicotine  14 mg Transdermal Daily  . pantoprazole  40 mg Oral BID  . polyethylene glycol  17 g Oral BID  . potassium chloride  20 mEq Oral Daily  . pravastatin  20 mg Oral q1800  . senna-docusate  1 tablet Oral BID  . tamsulosin  0.4 mg Oral QPC supper    Continuous Infusions: . sodium chloride 75 mL/hr at 09/01/17 0831     LOS: 1 day     Alma Friendly, MD [facility]   If 7PM-7AM, please contact night-coverage [email or web address] Password [facility] 09/01/2017, 3:37 PM

## 2017-09-01 NOTE — Progress Notes (Signed)
Patient requested to have the ambulating with checking the heart rate done later today after his family leaves. He voiced his concern of not wanting to leave the hospital today. He stated he is afraid he will fall.

## 2017-09-01 NOTE — Progress Notes (Signed)
Patient complaining of having the urge to urinate but unable to.  Bladder scanned the pt and recored 743 ml.  Last output was 1416 of  300 mL.  Notified the MD.  Will continue to monitor the patient

## 2017-09-01 NOTE — Progress Notes (Signed)
MD notified of patient's heart rate elevating when ambulating.

## 2017-09-01 NOTE — Progress Notes (Signed)
MD notified of conversation with PT.

## 2017-09-01 NOTE — Progress Notes (Signed)
PT paged per MD order.

## 2017-09-01 NOTE — Progress Notes (Signed)
In and out cath the patient.  Retrieved 800 mL.  Will continue to monitor the patient.

## 2017-09-01 NOTE — Progress Notes (Signed)
PT said they are slammed with their patient list today so they are not able to see the patient till tomorrow. I requested that they put him at the top of the list and come in the morning. I told them the MD wants to know how he does ambulating and how his heart rate does. It is important to she if it shoots up high with ambulation. They made a note of it and put him on the list for tomorrow to come first thing in the morning.

## 2017-09-01 NOTE — Progress Notes (Signed)
Patient's pulse sitting was 72, standing at 0 minutes is 85, walking his heart rate jumped slowly all the way up to 114. Patient stated he was dizzy and we turned around.

## 2017-09-02 DIAGNOSIS — E7800 Pure hypercholesterolemia, unspecified: Secondary | ICD-10-CM

## 2017-09-02 DIAGNOSIS — I2510 Atherosclerotic heart disease of native coronary artery without angina pectoris: Secondary | ICD-10-CM

## 2017-09-02 DIAGNOSIS — I951 Orthostatic hypotension: Principal | ICD-10-CM

## 2017-09-02 LAB — BASIC METABOLIC PANEL
Anion gap: 12 (ref 5–15)
BUN: 6 mg/dL (ref 6–20)
CHLORIDE: 98 mmol/L — AB (ref 101–111)
CO2: 26 mmol/L (ref 22–32)
CREATININE: 0.8 mg/dL (ref 0.61–1.24)
Calcium: 8.7 mg/dL — ABNORMAL LOW (ref 8.9–10.3)
GFR calc Af Amer: >60 mL/min (ref >60)
GFR calc non Af Amer: >60 mL/min (ref >60)
GLUCOSE: 105 mg/dL — AB (ref 65–99)
Potassium: 3.8 mmol/L (ref 3.5–5.1)
SODIUM: 136 mmol/L (ref 135–145)

## 2017-09-02 LAB — CBC WITH DIFFERENTIAL/PLATELET
BASOS PCT: 0 %
Basophils Absolute: 0 10*3/uL (ref 0.0–0.1)
EOS PCT: 1 %
Eosinophils Absolute: 0.1 10*3/uL (ref 0.0–0.7)
HCT: 35.1 % — ABNORMAL LOW (ref 39.0–52.0)
HEMOGLOBIN: 11.7 g/dL — AB (ref 13.0–17.0)
LYMPHS ABS: 2.1 10*3/uL (ref 0.7–4.0)
Lymphocytes Relative: 21 %
MCH: 30.7 pg (ref 26.0–34.0)
MCHC: 33.3 g/dL (ref 30.0–36.0)
MCV: 92.1 fL (ref 78.0–100.0)
MONO ABS: 0.9 10*3/uL (ref 0.1–1.0)
Monocytes Relative: 9 %
Neutro Abs: 7 10*3/uL (ref 1.7–7.7)
Neutrophils Relative %: 69 %
Platelets: 171 10*3/uL (ref 150–400)
RBC: 3.81 MIL/uL — ABNORMAL LOW (ref 4.22–5.81)
RDW: 14.4 % (ref 11.5–15.5)
WBC: 10.1 10*3/uL (ref 4.0–10.5)

## 2017-09-02 MED ORDER — NICOTINE 14 MG/24HR TD PT24: 14.0000 mg | MEDICATED_PATCH | Freq: Every day | TRANSDERMAL | 0 refills | Status: DC

## 2017-09-02 MED ORDER — TAMSULOSIN HCL 0.4 MG PO CAPS: 0.4000 mg | ORAL_CAPSULE | Freq: Every day | ORAL | 0 refills | Status: DC

## 2017-09-02 MED ORDER — SILODOSIN 8 MG PO CAPS: 8.0000 mg | ORAL_CAPSULE | Freq: Every day | ORAL | 0 refills | Status: DC

## 2017-09-02 MED FILL — Lidocaine HCl Local Soln Prefilled Syringe 100 MG/5ML (2%): INTRAMUSCULAR | Qty: 5 | Status: AC

## 2017-09-02 MED FILL — Succinylcholine Chloride Sol Pref Syr 200 MG/10ML (20 MG/ML): INTRAVENOUS | Qty: 10 | Status: AC

## 2017-09-02 MED FILL — Ondansetron HCl Inj 4 MG/2ML (2 MG/ML): INTRAMUSCULAR | Qty: 2 | Status: AC

## 2017-09-02 MED FILL — Propofol IV Emul 200 MG/20ML (10 MG/ML): INTRAVENOUS | Qty: 20 | Status: AC

## 2017-09-02 NOTE — Progress Notes (Signed)
Physical Therapy Treatment Patient Details Name: Jerry [name] MRN: [id number] DOB: Nov 26, 1950 Today's Date: 09/02/2017    History of Present Illness 67 y.o. male admitted on 08/28/17 for dizziness.  MRI negative for CVA.  MD suspicious of vestibular dysfunction.  Pt also being treated for chronic diastolic CHF.  Pt with significant PMH of HTN, CAD, chronic back pain, anxiety, anemia, bil TKA, and back surgery.    PT Comments    Pt mobility greatly limited by onset of lightheadedness at 90 bpm HR and progressively gets worse. S/P ambulation of 20 feet pt with onset of lightheadedness due to HR in 90 and proressively con't to increase to 104bpm s/p 25 feet. Didn't not push further ambulation as pt also with onset of blurry vision. Anticipate once HR issue assess and corrected pt with advance quickly with mobility. Acuet PT to con't to see.   Follow Up Recommendations  Home health PT;Supervision - Intermittent(once HR under control)     Equipment Recommendations  None recommended by PT    Recommendations for Other Services       Precautions / Restrictions Precautions Precautions: Fall Precaution Comments: watch HR with mobility Restrictions Weight Bearing Restrictions: No    Mobility  Bed Mobility Overal bed mobility: Modified Independent Bed Mobility: Supine to Sit     Supine to sit: HOB elevated     General bed mobility comments: pt reports "i can't lay flat because of my back", HOB elevated and used bed rail but didn't required phsyical assist  Transfers Overall transfer level: Needs assistance Equipment used: Rolling walker (2 wheeled) Transfers: Sit to/from Stand Sit to Stand: Min guard         General transfer comment: increased in HR from 72 to 85 immeadiatley  Ambulation/Gait Ambulation/Gait assistance: Min guard Ambulation Distance (Feet): 25 Feet(x2) Assistive device: Rolling walker (2 wheeled) Gait Pattern/deviations: Step-through pattern;Narrow  base of support;Trunk flexed;Decreased stride length Gait velocity: dec Gait velocity interpretation: Below normal speed for age/gender General Gait Details: pt with increased dependence on UEs, HR went up to 105 with report of lightheadedness at 100. SpO2 95% on RA.   Stairs            Wheelchair Mobility    Modified Rankin (Stroke Patients Only)       Balance Overall balance assessment: Needs assistance Sitting-balance support: Feet supported Sitting balance-Leahy Scale: Good     Standing balance support: Bilateral upper extremity supported;During functional activity Standing balance-Leahy Scale: Fair Standing balance comment: walker needed to walk but is minimal support to stand                            Cognition Arousal/Alertness: Awake/alert Behavior During Therapy: WFL for tasks assessed/performed Overall Cognitive Status: Within Functional Limits for tasks assessed                                 General Comments: pt frustrated with con't feeling of lightheadedness with mobiltiy      Exercises General Exercises - Lower Extremity Long Arc Quad: PROM;10 reps;Seated;Both;Strengthening(HR raised to 103, c/o lightheadedness)    General Comments        Pertinent Vitals/Pain Pain Assessment: 0-10 Pain Score: 4  Pain Location: low back Pain Descriptors / Indicators: Aching Pain Intervention(s): Monitored during session    Home Living  Prior Function            PT Goals (current goals can now be found in the care plan section) Progress towards PT goals: Progressing toward goals    Frequency    Min 3X/week      PT Plan Current plan remains appropriate    Co-evaluation              AM-PAC PT "6 Clicks" Daily Activity  Outcome Measure  Difficulty turning over in bed (including adjusting bedclothes, sheets and blankets)?: None Difficulty moving from lying on back to sitting on the  side of the bed? : None Difficulty sitting down on and standing up from a chair with arms (e.g., wheelchair, bedside commode, etc,.)?: None Help needed moving to and from a bed to chair (including a wheelchair)?: A Little Help needed walking in hospital room?: A Little Help needed climbing 3-5 steps with a railing? : A Little 6 Click Score: 21    End of Session Equipment Utilized During Treatment: Gait belt Activity Tolerance: Patient tolerated treatment well;Patient limited by fatigue Patient left: in chair;with call bell/phone within reach;with nursing/sitter in room Nurse Communication: Mobility status PT Visit Diagnosis: Muscle weakness (generalized) (M62.81);Difficulty in walking, not elsewhere classified (R26.2)     Time: 4967-5916 PT Time Calculation (min) (ACUTE ONLY): 20 min  Charges:  $Gait Training: 8-22 mins                    G Codes:       Kittie Plater, PT, DPT Pager #: [phone] Office #: [phone]    [place] 09/02/2017, 8:54 AM

## 2017-09-02 NOTE — Progress Notes (Signed)
Occupational Therapy Treatment Patient Details Name: Jerry [name] MRN: [id number] DOB: Jan 13, 1951 Today's Date: 09/02/2017    History of present illness 67 y.o. male admitted on 08/28/17 for dizziness.  MRI negative for CVA.  MD suspicious of vestibular dysfunction.  Pt also being treated for chronic diastolic CHF.  Pt with significant PMH of HTN, CAD, chronic back pain, anxiety, anemia, bil TKA, and back surgery.   OT comments  Pt completed toileting, sponge bathing and ambulation within his room with set up to min guard assist. C/o mild dizziness with BP 156/79 and HR 90 after activity.   Follow Up Recommendations  Home health OT;Supervision/Assistance - 24 hour    Equipment Recommendations  None recommended by OT    Recommendations for Other Services      Precautions / Restrictions Precautions Precautions: Fall Precaution Comments: watch HR with mobility       Mobility Bed Mobility Overal bed mobility: Modified Independent                Transfers Overall transfer level: Needs assistance Equipment used: Rolling walker (2 wheeled) Transfers: Sit to/from Stand Sit to Stand: Supervision         General transfer comment: from bed and 3in 1, HR from 72 to 90    Balance Overall balance assessment: Needs assistance   Sitting balance-Leahy Scale: Good       Standing balance-Leahy Scale: Fair                             ADL either performed or assessed with clinical judgement   ADL Overall ADL's : Needs assistance/impaired     Grooming: Wash/dry hands;Wash/dry face;Sitting;Set up   Upper Body Bathing: Minimal assistance;Sitting       Upper Body Dressing : Supervision/safety;Sitting       Toilet Transfer: Min guard;BSC;RW;Ambulation   Toileting- Clothing Manipulation and Hygiene: Supervision/safety;Sit to/from stand       Functional mobility during ADLs: Min guard;Rolling walker General ADL Comments: pt reporting mild dizziness,  HR 90 and BP 156/79     Vision       Perception     Praxis      Cognition Arousal/Alertness: Awake/alert Behavior During Therapy: WFL for tasks assessed/performed Overall Cognitive Status: Within Functional Limits for tasks assessed                                          Exercises     Shoulder Instructions       General Comments      Pertinent Vitals/ Pain       Pain Assessment: Faces Faces Pain Scale: Hurts little more Pain Location: low back Pain Descriptors / Indicators: Aching Pain Intervention(s): Monitored during session;Repositioned  Home Living                                          Prior Functioning/Environment              Frequency  Min 2X/week        Progress Toward Goals  OT Goals(current goals can now be found in the care plan section)  Progress towards OT goals: Progressing toward goals  Acute Rehab OT Goals Patient Stated Goal: to get better OT  Goal Formulation: With patient Time For Goal Achievement: 09/12/17 Potential to Achieve Goals: Good  Plan Discharge plan remains appropriate    Co-evaluation                 AM-PAC PT "6 Clicks" Daily Activity     Outcome Measure   Help from another person eating meals?: None Help from another person taking care of personal grooming?: A Little Help from another person toileting, which includes using toliet, bedpan, or urinal?: A Little Help from another person bathing (including washing, rinsing, drying)?: A Little Help from another person to put on and taking off regular upper body clothing?: None Help from another person to put on and taking off regular lower body clothing?: A Little 6 Click Score: 20    End of Session Equipment Utilized During Treatment: Gait belt;Rolling walker  OT Visit Diagnosis: Unsteadiness on feet (R26.81);Muscle weakness (generalized) (M62.81);Pain   Activity Tolerance Patient tolerated treatment well    Patient Left in chair;with call bell/phone within reach   Nurse Communication Other (comment)(aware of pt's urine output and that he has been bathed)        Time: 9924-2683 OT Time Calculation (min): 41 min  Charges: OT General Charges $OT Visit: 1 Visit OT Treatments $Self Care/Home Management : 38-52 mins  09/02/2017 Nestor Lewandowsky, OTR/L Pager: [phone]   Werner Lean, Haze Boyden 09/02/2017, 2:30 PM

## 2017-09-02 NOTE — Consult Note (Signed)
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 interest. No precontrast thoracic spine imaging was obtained. MRI THORACIC SPINE FINDINGS Alignment: Slight levoconvex thoracic curvature. Straightening of the normal kyphosis in the mid and lower thoracic spine. No listhesis. Vertebrae: Unchanged mild chronic T6 superior endplate compression fracture/Schmorl's node deformity and mild  chronic T11 and T12 compression fractures. Scattered small T2 hyperintense lesions most notable in the T4 and T10 vertebral bodies demonstrating enhancement varying from none to complete. Cord: Normal signal and morphology. Infusion catheter enters the thecal sac on the left at T11-12 and courses superiorly. An additional catheter extends superiorly from the lumbar spine. The comparison chest CT demonstrates 2 catheters terminating at the T8-9 and T9-10 disc space levels with a grossly unchanged appearance on today's MRI. No abnormal intradural enhancement. No epidural fluid collection Paraspinal and other soft tissues:  Unremarkable. Disc levels: Mild multilevel thoracic disc degeneration and facet arthrosis. Small right central disc protrusion at T3-4, minimal disc bulging at T6-7, small right paracentral disc protrusion at T7-8, and mild disc bulging at T9-10 and T10-11 without significant spinal stenosis or spinal cord mass effect. No compressive neural foraminal stenosis. MRI LUMBAR SPINE FINDINGS Segmentation:  Standard. Alignment:  Chronic lumbar spine straightening.  No listhesis. Vertebrae: Unchanged chronic moderate L2 compression fracture. No acute fracture or suspicious osseous lesion. Conus medullaris: Extends to the L1-2 level and appears normal. A catheter enters the thecal sac at the L4 level and courses superiorly into the thoracic spine. Paraspinal and other soft tissues: Bilateral paraspinal muscle atrophy. Moderate bladder distention. Disc levels: L1-2: Predominantly anterior spondylosis.  No stenosis. L2-3: Mild disc bulging and moderate facet and ligamentum flavum hypertrophy result in mild spinal stenosis, similar to the prior CT. No neural foraminal stenosis. L3-4 through L5-S1: Prior posterior decompression with solid interbody and posterior element osseous fusion. Widely patent spinal canal. Mild osseous neural foraminal narrowing on the right at L5-S1 with minimal narrowing on the left at L4-5 and L5-S1. IMPRESSION: 1. Intrathecal infusion catheters terminating in the lower thoracic spine as above. 2. Prior L3-S1 fusion with widely patent spinal canal. 3. Unchanged mild spinal stenosis at L2-3. 4. Mild thoracic disc and facet degeneration without significant stenosis. 5. Scattered small thoracic vertebral marrow lesions with varying enhancement. These may represent atypical hemangiomas, however metastatic disease [name] possible given the patient's history of malignancy. If the patient has ever had a thoracic spine MRI elsewhere, comparison with this would be most useful to assess stability. Otherwise,  consider nuclear medicine whole-body bone scan to evaluate for osseous metastatic disease. Electronically Signed   By: Logan Bores M.D.   On: 08/29/2017 15:57   Jerry Virgel Paling NG Contrast  Result Date: 08/29/2017 CLINICAL DATA:  67 year old male with dizziness and presyncope feeling for 4-5 days. Visual changes including blurry vision and seeing spots. Study performed under general anesthesia. EXAM: MRI HEAD WITHOUT CONTRAST MRA HEAD WITHOUT CONTRAST TECHNIQUE: Multiplanar, multiecho pulse sequences of the [name] and surrounding structures were obtained without intravenous contrast. Angiographic images of the head were obtained using MRA technique without contrast. COMPARISON:  No prior head or [name] imaging. FINDINGS: MRI HEAD FINDINGS [name]: Cerebral volume [name] within normal limits for age. No restricted diffusion to suggest acute infarction. No midline shift, mass effect, evidence of mass lesion, ventriculomegaly, extra-axial collection or acute intracranial hemorrhage. Cervicomedullary junction and pituitary are within normal limits. Pearline Cables and white matter signal [name] within normal limits for age throughout the [name]. No cortical encephalomalacia or chronic cerebral blood products. The deep gray matter nuclei, brainstem, and cerebellum appear normal. Vascular: Major intracranial vascular flow voids are preserved. Skull and upper cervical spine: Partially visible cervical spine degeneration, including disc and endplate degeneration at C3-C4 which appears to result in mild degenerative spinal stenosis. Normal bone marrow signal. Sinuses/Orbits: Normal optic chiasm.  Normal noncontrast appearance of the cavernous sinus. Bilateral optic nerves and bilateral intraorbital soft tissues appear symmetric and normal. The paranasal sinuses are clear. Other: There [name] fluid layering in the pharynx; this study was performed under general anesthesia, and the patient [name] intubated. There [name] trace bilateral mastoid air cell fluid.  Visible internal auditory structures appear normal. Scalp and face soft tissues appear negative. MRA HEAD FINDINGS Antegrade flow in the posterior circulation with codominant distal vertebral arteries. The left PICA origin [name] normal. Some of the right PICA branches are visible and appear patent. Patent vertebrobasilar junction. Patent basilar artery without stenosis. Bilateral AICA, SCA, and PCA origins are normal. Small right posterior communicating artery [name] present, the left [name] more diminutive or absent. Bilateral PCA branches are within normal limits, mild tortuosity of the left P1 segment. Antegrade flow in both ICA siphons. No siphon stenosis. Normal ophthalmic and right posterior communicating artery origins. Normal carotid termini. Normal MCA and ACA origins. Anterior communicating artery and visible bilateral ACA branches are normal. Bilateral MCA M1 segments, bilateral MCA bifurcations, and visible bilateral MCA branches appear normal. IMPRESSION: 1. No acute intracranial abnormality and normal for age noncontrast MRI appearance of the [name]. 2. Normal intracranial MRA. Electronically Signed   By: Genevie Ann M.D.   On: 08/29/2017 15:34    Assessment and Plan:   1. Positional tachycardia: patient reports dizziness with ambulation which started ~1.5 wks ago. Has been noted to be dizzy while working with PT, associated with HR increases to 120s with minimal exertion. Orthostatic vitals reportedly negative. - Seems orthostatic mediated despite negative orthostatic vitals. Would favor liberalizing fluid and salt intake. - Agree with holding lasix and doxazosin which can contribute to dehydration/orthostasis  - Will monitor for improvement with rehydration  2. Dizziness: dizziness initially concerning for CVA. Patient underwent Jerry [name]/MRA head under anesthesia which was negative for acute findings. Also evaluated by vestibular PT and thought symptoms not likely related to BPPV.   3. CAD s/p MI with  PCI to OM in 2000: appears stable. Without anginal complaints. States chest tightness he experienced last week did not feel similar to the crushing chest pressure he felt with his MI.  - Continue ASA and lovastatin  4. 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 Recent surveillance CT Chest (07/2017) without findings suggestive of recurrent tumor or metastatic disease. Patient reports a 50lb weight loss over the past 2 months. Warrants further investigation - consider age appropriate cancer screenings.  - Continue management per primary team - plans for outpt PET scan  5. HLD: LDL 70 - Continue lovastatin   For questions or updates, please contact [place] Please consult [email or web address] for contact info under Cardiology/STEMI.   Signed, Abigail Butts, PA-C  09/02/2017 11:14 AM [phone]  I have seen and examined the patient along with Roby Lofts, PA.  I have reviewed the chart, notes and new data.  I agree with PA's note.  Key new complaints: still lightheaded when he stands up, although a little improved. Reports doxazosin was prescribed for HBP, although he has had some urinary difficulty occasionally in the past. He [name] very strict with his low salt diet. He takes a loop diuretic for ankle edema (none present right now). Key examination changes: obese, normal CV exam, no edema, no JVD. BP lying down 150/75, HR 72 BP standing up 135/80, HR 104 (a little dizzy) Key new findings / data: reviewed echo. Normal LV systolic function.  Interpreted as showing "grade I diastolic dysfunction", but the tissue Doppler velocities are entirely normal and the left atrium [name] not dilated. Even the report of mild LVH appears incorrect (both the septum and posterior wall are < 11 mm thickness). I would interpret the echo findings as indicating normal diastolic function and low left atrial pressure/relative hypovolemia.  ASSESSMENT: He has orthostatic hypotension,  mostly compensated by tachycardia. I believe it [name] unlikely that he has developed idiopathic POTS at age 24. Suspect it [name] due to use of alpha blocker and relative hypovolemia in the setting of low sodium diet and diuretic use. Pathophysiology probably unmasked after his 50 lb weight loss.  PLAN: Liberalize sodium intake. Avoid diuretics. Stop alpha blocker. If he develops urinary retention, prefer a modern agent such as Rapaflo or Uroxatral, with less orthostatic hypotension. Wear compression stockings when upright. Tolerate mild supine hypertension (allow SBP up to 160 when flat)  Sanda Klein, MD, [place] [phone] 09/02/2017, 2:04 PM

## 2017-09-02 NOTE — Discharge Summary (Signed)
Discharge Summary  Jerry [name] GYB:[id number] DOB: July 09, 1951  PCP: Aletha Halim., PA-C  Admit date: 08/28/2017 Discharge date: 09/02/2017  Time spent: > 30 mins  Recommendations for Outpatient Follow-up:  1. PCP  Discharge Diagnoses:  Active Hospital Problems   Diagnosis Date Noted  . Dizziness 08/28/2017  . Anxiety 08/14/2016  . Essential hypertension 08/14/2016  . HLD (hyperlipidemia) 08/14/2016  . Debilitated 05/31/2015  . Tobacco abuse 04/18/2015  . Morbid obesity ([place]) 03/17/2015  . Chronic pain syndrome     Resolved Hospital Problems  No resolved problems to display.    Discharge Condition: Stable  Diet recommendation: Heart healthy  Vitals:   09/02/17 0920 09/02/17 1317  BP:  (!) 151/68  Pulse: 86 78  Resp: 18 18  Temp:  97.7 F (36.5 C)  SpO2: 94% 98%    History of present illness:  Jerry [name] a 66 y.o.malewith medical history significant ofchronic pain, HTN, HLD, tobacco abuse who comes in with a 4-5 day history of dizziness and feeling of "passing out". He is unable to provide information of room spinning or unsteadiness with position changes --- he says he is "just dizzy" and that "something is wrong with me". Patient denies seeing spots but does say his vision has been getting blurry. He went to see his PCP and she sent him to the ER for an evaluation. He reports increasing leg swelling b/l. But has not been wearing his TED hose He also reported some chest discomfort and left arm tightness to the ER doctor. No new medications or change in medications. Denies sick contacts. Lives with son and daughter in law. Reports increased thirst and weight loss of 45lbs in the last 10 weeks. Recently in the hospital in December with back pain and d/c'd on steroids.  Today, patient reported dizziness upon standing and ambulation is still present but admits to slowly getting better.  Patient very concerned about going home and dizziness getting  worse.  Patient was reassured that with time, his dizziness will improve.  Patient denies any chest pain, shortness of breath, nausea/vomiting, tinnitus, headaches, abdominal pain, fever/chills.    Hospital Course:  Active Problems:   Chronic pain syndrome   Morbid obesity ([facility])   Tobacco abuse   Debilitated   Essential hypertension   HLD (hyperlipidemia)   Anxiety   Dizziness  Dizziness likely due to orthostatic hypotension compensated by tachycardia  Orthostasis likely medication induced (doxazosin + PO lasix) with underlying weight loss Ongoing, with minimal improvement MRI/MRA brain negative for CVA ECHO showed LVEF of 87-68% normal diastolic fxn POC trop neg, EKG, no acute ST changes FLP: 70, HgbA1C: 5.8 PT vestibular testing done, unlikely BPPV Cardiology consulted: -Liberalize sodium intake, avoid diuretics, stop alpha blocker, stay hydrated -For BPH switch to modern agent such as Rapaflo or Uroxatral, with less orthostatic  hypotension -Wear compression stockings when upright. -Tolerate mild supine hypertension (allow SBP up to 160 when flat) Discontinued PO doxazosin and PO lasix. Started PO FLOMAX FOR BPH. Rapaflo 8mg  daily would be preferred as has lower chances of orthostatic hypotension, but it is very expensive Follow up with PCP  Chronic back pain/weight loss Stable MRI lumbar/thoracic spine showed: Morphine pump/infusion catheter position unchanged compared to prior CT. Thoracic spine MRI showed scattered small thoracic vertebral marrow lesions with varying enhancement. These may represent atypical hemangiomas, however metastatic disease is possible given the patient's history of malignancy. May consider nuclear medicine whole-body bone scan to evaluate for osseous metastatic disease. PCP  to schedule outpt PET scan for further evaluation   Bradycardia Resolved  Tobacco abuseh/o Advised on cessation Nicotine patch  HLD Continue statin LDL  70  HTN Stable Discontinued doxazosin due to dizziness  BPH Start PO flomax daily, d/c doxazosin   Procedures:  None   Consultations:  Cardiology   Discharge Exam: BP (!) 151/68 (BP Location: Right Arm)   Pulse 78   Temp 97.7 F (36.5 C) (Oral)   Resp 18   Ht 6\' 1"  (1.854 m)   Wt 72.6 kg (160 lb)   SpO2 98%   BMI 21.11 kg/m   General: Awake, alert, oriented, not in acute distress Cardiovascular: S1, S2 present, no added heart sounds Respiratory: Chest clear bilaterally  Discharge Instructions You were cared for by a hospitalist during your hospital stay. If you have any questions about your discharge medications or the care you received while you were in the hospital after you are discharged, you can call the unit and asked to speak with the hospitalist on call if the hospitalist that took care of you is not available. Once you are discharged, your primary care physician will handle any further medical issues. Please note that NO REFILLS for any discharge medications will be authorized once you are discharged, as it is imperative that you return to your primary care physician (or establish a relationship with a primary care physician if you do not have one) for your aftercare needs so that they can reassess your need for medications and monitor your lab values.   Allergies as of 09/02/2017      Reactions   Ciprofloxacin Anaphylaxis   Penicillins Rash   Has patient had a PCN reaction causing immediate rash, facial/tongue/throat swelling, SOB or lightheadedness with hypotension:unsure Has patient had a PCN reaction causing severe rash involving mucus membranes or skin necrosis:unsure Has patient had a PCN reaction that required hospitalization:unsure Has patient had a PCN reaction occurring within the last 10 years:NO If all of the above answers are "NO", then may proceed with Cephalosporin use.   Tramadol Hcl Rash      Medication List    STOP taking these  medications   doxazosin 1 MG tablet Commonly known as:  CARDURA   furosemide 20 MG tablet Commonly known as:  LASIX   KLOR-CON M10 10 MEQ tablet Generic drug:  potassium chloride     TAKE these medications   aspirin EC 81 MG tablet Take 81 mg by mouth daily.   budesonide-formoterol 160-4.5 MCG/ACT inhaler Commonly known as:  SYMBICORT Inhale 2 puffs into the lungs daily.   calcium carbonate 500 MG chewable tablet Commonly known as:  TUMS - dosed in mg elemental calcium Chew 1 tablet by mouth daily as needed for indigestion or heartburn.   cholecalciferol 1000 units tablet Commonly known as:  VITAMIN D Take 1,000 Units by mouth daily.   ferrous sulfate 325 (65 FE) MG EC tablet Take 325 mg by mouth daily with breakfast.   ibuprofen 200 MG tablet Commonly known as:  ADVIL,MOTRIN Take 200 mg by mouth every 8 (eight) hours as needed for mild pain.   lovastatin 20 MG tablet Commonly known as:  MEVACOR Take 1 tablet (20 mg total) by mouth at bedtime.   methylPREDNISolone 4 MG tablet Commonly known as:  MEDROL Take 1 tablet by mouth daily. On day 12 of therapy   MOVANTIK 25 MG Tabs tablet Generic drug:  naloxegol oxalate Take 12.5 mg by mouth daily.   multivitamin with  minerals Tabs tablet Take 1 tablet by mouth daily. FOR SENIORS   nicotine 14 mg/24hr patch Commonly known as:  NICODERM CQ - dosed in mg/24 hours Place 1 patch (14 mg total) onto the skin daily. Start taking on:  09/03/2017   oxycodone 30 MG immediate release tablet Commonly known as:  ROXICODONE Take 30 mg by mouth 4 (four) times daily. For pain.   pantoprazole 40 MG tablet Commonly known as:  PROTONIX Take 1 tablet (40 mg total) by mouth 2 (two) times daily.   polyethylene glycol packet Commonly known as:  MIRALAX / GLYCOLAX Take 17 g by mouth daily.   promethazine 25 MG tablet Commonly known as:  PHENERGAN Take 25 mg by mouth daily as needed for nausea. For nausea.   senna-docusate 8.6-50  MG tablet Commonly known as:  Senokot-S Take 2 tablets by mouth 2 (two) times daily.   silodosin 8 MG Caps capsule Commonly known as:  RAPAFLO Take 1 capsule (8 mg total) by mouth daily with breakfast.   tamsulosin 0.4 MG Caps capsule Commonly known as:  FLOMAX Take 1 capsule (0.4 mg total) by mouth daily after supper.   traZODone 150 MG tablet Commonly known as:  DESYREL Take 150-300 mg by mouth at bedtime. Takes 1.5 tablets   vitamin C 500 MG tablet Commonly known as:  ASCORBIC ACID Take 500 mg by mouth daily.   zinc gluconate 50 MG tablet Take 50 mg by mouth daily.      Allergies  Allergen Reactions  . Ciprofloxacin Anaphylaxis  . Penicillins Rash    Has patient had a PCN reaction causing immediate rash, facial/tongue/throat swelling, SOB or lightheadedness with hypotension:unsure Has patient had a PCN reaction causing severe rash involving mucus membranes or skin necrosis:unsure Has patient had a PCN reaction that required hospitalization:unsure Has patient had a PCN reaction occurring within the last 10 years:NO If all of the above answers are "NO", then may proceed with Cephalosporin use.   . Tramadol Hcl Rash   Follow-up Information    Aletha Halim., PA-C. Schedule an appointment as soon as possible for a visit in 1 week(s).   Specialty:  Family Medicine Contact information: [address] [place] [address] [phone]        [facility] [place] Follow up.   Specialty:  [place] Why:  home health services arranged, office will call and set up home visits Contact information: [address] [place] [address] [phone]            The results of significant diagnostics from this hospitalization (including imaging, microbiology, ancillary and laboratory) are listed below for reference.    Significant Diagnostic Studies: Dg Chest 2 View  Result Date: 08/28/2017 CLINICAL DATA:  Near-syncope and  bilateral lower extremity swelling for 4-5 days. EXAM: CHEST  2 VIEW COMPARISON:  PA and lateral chest 12/04/2016.  CT chest 07/11/2017. FINDINGS: Volume loss in the right chest with some right basilar scar appears unchanged. The left lung is clear. No consolidative process, edema pneumothorax. Heart size is enlarged. Postoperative change of right thoracoplasty is much better seen on the patient's CT scan. Heart size is normal. No acute bony abnormality. IMPRESSION: No acute disease. Electronically Signed   By: Inge Rise M.D.   On: 08/28/2017 11:00   Mr Brain Wo Contrast  Result Date: 08/29/2017 CLINICAL DATA:  67 year old male with dizziness and presyncope feeling for 4-5 days. Visual changes including blurry vision and seeing spots. Study  performed under general anesthesia. EXAM: MRI HEAD WITHOUT CONTRAST MRA HEAD WITHOUT CONTRAST TECHNIQUE: Multiplanar, multiecho pulse sequences of the brain and surrounding structures were obtained without intravenous contrast. Angiographic images of the head were obtained using MRA technique without contrast. COMPARISON:  No prior head or brain imaging. FINDINGS: MRI HEAD FINDINGS Brain: Cerebral volume is within normal limits for age. No restricted diffusion to suggest acute infarction. No midline shift, mass effect, evidence of mass lesion, ventriculomegaly, extra-axial collection or acute intracranial hemorrhage. Cervicomedullary junction and pituitary are within normal limits. Pearline Cables and white matter signal is within normal limits for age throughout the brain. No cortical encephalomalacia or chronic cerebral blood products. The deep gray matter nuclei, brainstem, and cerebellum appear normal. Vascular: Major intracranial vascular flow voids are preserved. Skull and upper cervical spine: Partially visible cervical spine degeneration, including disc and endplate degeneration at C3-C4 which appears to result in mild degenerative spinal stenosis. Normal bone marrow  signal. Sinuses/Orbits: Normal optic chiasm. Normal noncontrast appearance of the cavernous sinus. Bilateral optic nerves and bilateral intraorbital soft tissues appear symmetric and normal. The paranasal sinuses are clear. Other: There is fluid layering in the pharynx; this study was performed under general anesthesia, and the patient is intubated. There is trace bilateral mastoid air cell fluid. Visible internal auditory structures appear normal. Scalp and face soft tissues appear negative. MRA HEAD FINDINGS Antegrade flow in the posterior circulation with codominant distal vertebral arteries. The left PICA origin is normal. Some of the right PICA branches are visible and appear patent. Patent vertebrobasilar junction. Patent basilar artery without stenosis. Bilateral AICA, SCA, and PCA origins are normal. Small right posterior communicating artery is present, the left is more diminutive or absent. Bilateral PCA branches are within normal limits, mild tortuosity of the left P1 segment. Antegrade flow in both ICA siphons. No siphon stenosis. Normal ophthalmic and right posterior communicating artery origins. Normal carotid termini. Normal MCA and ACA origins. Anterior communicating artery and visible bilateral ACA branches are normal. Bilateral MCA M1 segments, bilateral MCA bifurcations, and visible bilateral MCA branches appear normal. IMPRESSION: 1. No acute intracranial abnormality and normal for age noncontrast MRI appearance of the brain. 2. Normal intracranial MRA. Electronically Signed   By: Genevie Ann M.D.   On: 08/29/2017 15:34   Mr Thoracic Spine W Contrast  Result Date: 08/29/2017 CLINICAL DATA:  Chronic back pain. Concern for malpositioning of spinal infusion pump catheter. History of rib osteosarcoma. EXAM: MRI THORACIC SPINE WITH CONTRAST MRI LUMBAR SPINE WITHOUT AND WITH CONTRAST TECHNIQUE: Multiplanar and multiecho pulse sequences of the thoracic spine were obtained with intravenous contrast.  Multiplanar and multiecho pulse sequences of the lumbar spine were obtained without and with intravenous contrast. CONTRAST:  59mL MULTIHANCE GADOBENATE DIMEGLUMINE 529 MG/ML IV SOLN COMPARISON:  Lumbar spine CT 07/14/2017.  Chest CT 07/11/2017. FINDINGS: The examination was performed under general anesthesia. Only a lumbar spine MRI was initially requested, however after consultation with the ordering physician a tailored thoracic spine MRI was also performed for assessment of the infusion catheter. IV contrast had already been administered for the lumbar portion of the examination, therefore the thoracic study consists of sagittal and axial T2, fat saturated sagittal T1, and non fat saturated axial T1 images. Axial T1 images were only obtained through the lower thoracic spine in the area of interest. No precontrast thoracic spine imaging was obtained. MRI THORACIC SPINE FINDINGS Alignment: Slight levoconvex thoracic curvature. Straightening of the normal kyphosis in the mid and lower thoracic spine.  No listhesis. Vertebrae: Unchanged mild chronic T6 superior endplate compression fracture/Schmorl's node deformity and mild chronic T11 and T12 compression fractures. Scattered small T2 hyperintense lesions most notable in the T4 and T10 vertebral bodies demonstrating enhancement varying from none to complete. Cord: Normal signal and morphology. Infusion catheter enters the thecal sac on the left at T11-12 and courses superiorly. An additional catheter extends superiorly from the lumbar spine. The comparison chest CT demonstrates 2 catheters terminating at the T8-9 and T9-10 disc space levels with a grossly unchanged appearance on today's MRI. No abnormal intradural enhancement. No epidural fluid collection Paraspinal and other soft tissues: Unremarkable. Disc levels: Mild multilevel thoracic disc degeneration and facet arthrosis. Small right central disc protrusion at T3-4, minimal disc bulging at T6-7, small right  paracentral disc protrusion at T7-8, and mild disc bulging at T9-10 and T10-11 without significant spinal stenosis or spinal cord mass effect. No compressive neural foraminal stenosis. MRI LUMBAR SPINE FINDINGS Segmentation:  Standard. Alignment:  Chronic lumbar spine straightening.  No listhesis. Vertebrae: Unchanged chronic moderate L2 compression fracture. No acute fracture or suspicious osseous lesion. Conus medullaris: Extends to the L1-2 level and appears normal. A catheter enters the thecal sac at the L4 level and courses superiorly into the thoracic spine. Paraspinal and other soft tissues: Bilateral paraspinal muscle atrophy. Moderate bladder distention. Disc levels: L1-2: Predominantly anterior spondylosis.  No stenosis. L2-3: Mild disc bulging and moderate facet and ligamentum flavum hypertrophy result in mild spinal stenosis, similar to the prior CT. No neural foraminal stenosis. L3-4 through L5-S1: Prior posterior decompression with solid interbody and posterior element osseous fusion. Widely patent spinal canal. Mild osseous neural foraminal narrowing on the right at L5-S1 with minimal narrowing on the left at L4-5 and L5-S1. IMPRESSION: 1. Intrathecal infusion catheters terminating in the lower thoracic spine as above. 2. Prior L3-S1 fusion with widely patent spinal canal. 3. Unchanged mild spinal stenosis at L2-3. 4. Mild thoracic disc and facet degeneration without significant stenosis. 5. Scattered small thoracic vertebral marrow lesions with varying enhancement. These may represent atypical hemangiomas, however metastatic disease is possible given the patient's history of malignancy. If the patient has ever had a thoracic spine MRI elsewhere, comparison with this would be most useful to assess stability. Otherwise, consider nuclear medicine whole-body bone scan to evaluate for osseous metastatic disease. Electronically Signed   By: Logan Bores M.D.   On: 08/29/2017 15:57   Mr Lumbar Spine W Wo  Contrast  Result Date: 08/29/2017 CLINICAL DATA:  Chronic back pain. Concern for malpositioning of spinal infusion pump catheter. History of rib osteosarcoma. EXAM: MRI THORACIC SPINE WITH CONTRAST MRI LUMBAR SPINE WITHOUT AND WITH CONTRAST TECHNIQUE: Multiplanar and multiecho pulse sequences of the thoracic spine were obtained with intravenous contrast. Multiplanar and multiecho pulse sequences of the lumbar spine were obtained without and with intravenous contrast. CONTRAST:  36mL MULTIHANCE GADOBENATE DIMEGLUMINE 529 MG/ML IV SOLN COMPARISON:  Lumbar spine CT 07/14/2017.  Chest CT 07/11/2017. FINDINGS: The examination was performed under general anesthesia. Only a lumbar spine MRI was initially requested, however after consultation with the ordering physician a tailored thoracic spine MRI was also performed for assessment of the infusion catheter. IV contrast had already been administered for the lumbar portion of the examination, therefore the thoracic study consists of sagittal and axial T2, fat saturated sagittal T1, and non fat saturated axial T1 images. Axial T1 images were only obtained through the lower thoracic spine in the area of interest. No precontrast thoracic spine  imaging was obtained. MRI THORACIC SPINE FINDINGS Alignment: Slight levoconvex thoracic curvature. Straightening of the normal kyphosis in the mid and lower thoracic spine. No listhesis. Vertebrae: Unchanged mild chronic T6 superior endplate compression fracture/Schmorl's node deformity and mild chronic T11 and T12 compression fractures. Scattered small T2 hyperintense lesions most notable in the T4 and T10 vertebral bodies demonstrating enhancement varying from none to complete. Cord: Normal signal and morphology. Infusion catheter enters the thecal sac on the left at T11-12 and courses superiorly. An additional catheter extends superiorly from the lumbar spine. The comparison chest CT demonstrates 2 catheters terminating at the T8-9  and T9-10 disc space levels with a grossly unchanged appearance on today's MRI. No abnormal intradural enhancement. No epidural fluid collection Paraspinal and other soft tissues: Unremarkable. Disc levels: Mild multilevel thoracic disc degeneration and facet arthrosis. Small right central disc protrusion at T3-4, minimal disc bulging at T6-7, small right paracentral disc protrusion at T7-8, and mild disc bulging at T9-10 and T10-11 without significant spinal stenosis or spinal cord mass effect. No compressive neural foraminal stenosis. MRI LUMBAR SPINE FINDINGS Segmentation:  Standard. Alignment:  Chronic lumbar spine straightening.  No listhesis. Vertebrae: Unchanged chronic moderate L2 compression fracture. No acute fracture or suspicious osseous lesion. Conus medullaris: Extends to the L1-2 level and appears normal. A catheter enters the thecal sac at the L4 level and courses superiorly into the thoracic spine. Paraspinal and other soft tissues: Bilateral paraspinal muscle atrophy. Moderate bladder distention. Disc levels: L1-2: Predominantly anterior spondylosis.  No stenosis. L2-3: Mild disc bulging and moderate facet and ligamentum flavum hypertrophy result in mild spinal stenosis, similar to the prior CT. No neural foraminal stenosis. L3-4 through L5-S1: Prior posterior decompression with solid interbody and posterior element osseous fusion. Widely patent spinal canal. Mild osseous neural foraminal narrowing on the right at L5-S1 with minimal narrowing on the left at L4-5 and L5-S1. IMPRESSION: 1. Intrathecal infusion catheters terminating in the lower thoracic spine as above. 2. Prior L3-S1 fusion with widely patent spinal canal. 3. Unchanged mild spinal stenosis at L2-3. 4. Mild thoracic disc and facet degeneration without significant stenosis. 5. Scattered small thoracic vertebral marrow lesions with varying enhancement. These may represent atypical hemangiomas, however metastatic disease is possible  given the patient's history of malignancy. If the patient has ever had a thoracic spine MRI elsewhere, comparison with this would be most useful to assess stability. Otherwise, consider nuclear medicine whole-body bone scan to evaluate for osseous metastatic disease. Electronically Signed   By: Logan Bores M.D.   On: 08/29/2017 15:57   Mr Virgel Paling FO Contrast  Result Date: 08/29/2017 CLINICAL DATA:  67 year old male with dizziness and presyncope feeling for 4-5 days. Visual changes including blurry vision and seeing spots. Study performed under general anesthesia. EXAM: MRI HEAD WITHOUT CONTRAST MRA HEAD WITHOUT CONTRAST TECHNIQUE: Multiplanar, multiecho pulse sequences of the brain and surrounding structures were obtained without intravenous contrast. Angiographic images of the head were obtained using MRA technique without contrast. COMPARISON:  No prior head or brain imaging. FINDINGS: MRI HEAD FINDINGS Brain: Cerebral volume is within normal limits for age. No restricted diffusion to suggest acute infarction. No midline shift, mass effect, evidence of mass lesion, ventriculomegaly, extra-axial collection or acute intracranial hemorrhage. Cervicomedullary junction and pituitary are within normal limits. Pearline Cables and white matter signal is within normal limits for age throughout the brain. No cortical encephalomalacia or chronic cerebral blood products. The deep gray matter nuclei, brainstem, and cerebellum appear normal. Vascular: Major intracranial  vascular flow voids are preserved. Skull and upper cervical spine: Partially visible cervical spine degeneration, including disc and endplate degeneration at C3-C4 which appears to result in mild degenerative spinal stenosis. Normal bone marrow signal. Sinuses/Orbits: Normal optic chiasm. Normal noncontrast appearance of the cavernous sinus. Bilateral optic nerves and bilateral intraorbital soft tissues appear symmetric and normal. The paranasal sinuses are clear.  Other: There is fluid layering in the pharynx; this study was performed under general anesthesia, and the patient is intubated. There is trace bilateral mastoid air cell fluid. Visible internal auditory structures appear normal. Scalp and face soft tissues appear negative. MRA HEAD FINDINGS Antegrade flow in the posterior circulation with codominant distal vertebral arteries. The left PICA origin is normal. Some of the right PICA branches are visible and appear patent. Patent vertebrobasilar junction. Patent basilar artery without stenosis. Bilateral AICA, SCA, and PCA origins are normal. Small right posterior communicating artery is present, the left is more diminutive or absent. Bilateral PCA branches are within normal limits, mild tortuosity of the left P1 segment. Antegrade flow in both ICA siphons. No siphon stenosis. Normal ophthalmic and right posterior communicating artery origins. Normal carotid termini. Normal MCA and ACA origins. Anterior communicating artery and visible bilateral ACA branches are normal. Bilateral MCA M1 segments, bilateral MCA bifurcations, and visible bilateral MCA branches appear normal. IMPRESSION: 1. No acute intracranial abnormality and normal for age noncontrast MRI appearance of the brain. 2. Normal intracranial MRA. Electronically Signed   By: Genevie Ann M.D.   On: 08/29/2017 15:34    Microbiology: No results found for this or any previous visit (from the past 240 hour(s)).   Labs: Basic Metabolic Panel: Recent Labs  Lab 08/28/17 0935 08/31/17 0324 09/02/17 0257  NA 138 136 136  K 3.6 3.9 3.8  CL 95* 99* 98*  CO2 29 27 26   GLUCOSE 134* 125* 105*  BUN 8 5* 6  CREATININE 1.02 0.85 0.80  CALCIUM 9.4 8.9 8.7*   Liver Function Tests: Recent Labs  Lab 08/28/17 0935  AST 24  ALT 31  ALKPHOS 62  BILITOT 0.8  PROT 6.9  ALBUMIN 3.7   Recent Labs  Lab 08/28/17 0935  LIPASE 22   No results for input(s): AMMONIA in the last 168 hours. CBC: Recent Labs   Lab 08/28/17 0935 09/02/17 0257  WBC 8.4 10.1  NEUTROABS  --  7.0  HGB 12.9* 11.7*  HCT 39.8 35.1*  MCV 93.4 92.1  PLT 198 171   Cardiac Enzymes: No results for input(s): CKTOTAL, CKMB, CKMBINDEX, TROPONINI in the last 168 hours. BNP: BNP (last 3 results) No results for input(s): BNP in the last 8760 hours.  ProBNP (last 3 results) No results for input(s): PROBNP in the last 8760 hours.  CBG: No results for input(s): GLUCAP in the last 168 hours.     Signed:  Alma Friendly, MD [facility] 09/02/2017, 5:36 PM

## 2017-09-02 NOTE — Progress Notes (Signed)
Pt waiting on son coming from a different to pick him up. Will discharge at sons arrival.

## 2017-09-02 NOTE — Care Management Note (Signed)
Case Management Note  Patient Details  Name: Jerry [name] MRN: [id number] Date of Birth: [birth year]  Subjective/Objective:         Admitted with  Dizziness, hx chronic  pain, HTN, HLD, tobacco abuse. Resides with family.  Scooter A Goodbar (Son) Jeannett Senior (Daughter)    [phone]      PCP: Bing Matter  Action/Plan: Transition to home with home health services to follow. [name] to provide transportation to home.  Expected Discharge Date:  09/02/17               Expected Discharge Plan:  [place]  In-House Referral:     Discharge planning Services  CM Consult  Post Acute Care Choice:    Choice offered to:  Patient  DME Arranged:    DME Agency:     HH Arranged:  PT, OT HH Agency:  [facility]  Status of Service:  Completed, signed off  If discussed at [place] of Stay Meetings, dates discussed:    Additional Comments:  Sharin Mons, RN 09/02/2017, 4:06 PM

## 2017-09-11 ENCOUNTER — Telehealth: Payer: Self-pay | Admitting: *Deleted

## 2017-09-11 NOTE — Telephone Encounter (Signed)
REFERRAL SENT TO SCHEDULING.  °

## 2017-09-24 ENCOUNTER — Ambulatory Visit: Payer: Medicare HMO | Admitting: Thoracic Surgery (Cardiothoracic Vascular Surgery)

## 2017-09-24 ENCOUNTER — Ambulatory Visit: Payer: Medicare HMO | Admitting: Cardiology

## 2017-09-30 ENCOUNTER — Ambulatory Visit: Payer: Medicare HMO | Admitting: Cardiology

## 2017-10-08 ENCOUNTER — Ambulatory Visit: Payer: Medicare HMO | Admitting: Thoracic Surgery (Cardiothoracic Vascular Surgery)

## 2017-10-08 VITALS — BP 140/71 | HR 72 | Resp 20 | Ht 73.0 in | Wt 285.0 lb

## 2017-10-08 DIAGNOSIS — C413 Malignant neoplasm of ribs, sternum and clavicle: Secondary | ICD-10-CM

## 2017-10-08 DIAGNOSIS — R222 Localized swelling, mass and lump, trunk: Secondary | ICD-10-CM
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
## 2017-10-15 ENCOUNTER — Ambulatory Visit: Payer: Medicare HMO | Admitting: Cardiology

## 2017-10-17 ENCOUNTER — Ambulatory Visit: Payer: Medicare HMO | Admitting: Cardiology

## 2017-10-21 ENCOUNTER — Telehealth: Payer: Self-pay | Admitting: *Deleted

## 2017-10-21 NOTE — Telephone Encounter (Signed)
   [facility] Pre-operative Risk Assessment    Request for surgical clearance:  1. What type of surgery is being performed? L TKA   2. When is this surgery scheduled? Pending   3. What type of clearance is required (medical clearance vs. Pharmacy clearance to hold med vs. Both)? both  4. Are there any medications that need to be held prior to surgery and how long?ASA   5. Practice name and name of physician performing surgery? [facility] Dr. Berenice Primas   6. What is your office phone and fax number? [phone]   7. Anesthesia type (None, local, MAC, general) ?    Jerry [name] Jerry [name] 10/21/2017, 10:39 AM  _________________________________________________________________   (provider comments below)    OV with Dr. Gwenlyn Found 10/23/17

## 2017-10-22 NOTE — Telephone Encounter (Signed)
   Mr. Kinslow has an appointment with Dr. Agustin Cree tomorrow.  Staff message sent requesting him to address both medical clearance for the surgery and holding aspirin.  Rosaria Ferries, PA-C 10/22/2017 4:20 PM Beeper [phone]

## 2017-10-23 ENCOUNTER — Other Ambulatory Visit: Payer: Self-pay | Admitting: Orthopedic Surgery

## 2017-10-23 ENCOUNTER — Encounter: Payer: Self-pay | Admitting: Cardiovascular Disease

## 2017-10-23 ENCOUNTER — Ambulatory Visit: Payer: Medicare HMO | Admitting: Cardiovascular Disease

## 2017-10-23 DIAGNOSIS — F17210 Nicotine dependence, cigarettes, uncomplicated: Secondary | ICD-10-CM | POA: Diagnosis not present

## 2017-10-23 DIAGNOSIS — I2510 Atherosclerotic heart disease of native coronary artery without angina pectoris: Secondary | ICD-10-CM | POA: Diagnosis not present

## 2017-10-23 DIAGNOSIS — I10 Essential (primary) hypertension: Secondary | ICD-10-CM

## 2017-10-23 DIAGNOSIS — E7800 Pure hypercholesterolemia, unspecified: Secondary | ICD-10-CM | POA: Diagnosis not present

## 2017-10-23 DIAGNOSIS — R42 Dizziness and giddiness: Secondary | ICD-10-CM

## 2017-10-23 NOTE — Assessment & Plan Note (Signed)
History of hyperlipidemia on statin therapy with lipid profile performed 08/29/17 revealed a total cholesterol 142, LDL 70 and HDL of 40

## 2017-10-23 NOTE — Assessment & Plan Note (Signed)
History of essential hypertension with blood pressures measured 162/71. He currently is not on antihypertensive medications. These were discontinued during her hospitalization in January at which time it was thought that he was orthostatic.

## 2017-10-23 NOTE — Assessment & Plan Note (Signed)
History of dizziness during a hospitalization 08/28/17 for one week. it wasdetermined that he was orthostatic as noted, his medications were adjusted, and he has had no further issues.

## 2017-10-23 NOTE — Patient Instructions (Signed)
Medication Instructions: Your physician recommends that you continue on your current medications as directed. Please refer to the Current Medication list given to you today.  Follow-Up: Your physician wants you to follow-up in: 1 year with Dr. Gwenlyn Found. You will receive a reminder letter in the mail two months in advance. If you don't receive a letter, please call our office to schedule the follow-up appointment.  You have been cleared for Knee Replacement surgery. I will send a letter to Dr. Berenice Primas.   If you need a refill on your cardiac medications before your next appointment, please call your pharmacy.

## 2017-10-23 NOTE — Assessment & Plan Note (Signed)
History of CAD status post circumcised obtuse marginal branch PCI and stenting back in 1997 without evidence of recurrence clinically since

## 2017-10-23 NOTE — Assessment & Plan Note (Signed)
Discontinued 3 years ago

## 2017-10-23 NOTE — Progress Notes (Signed)
10/23/2017 Jerry [name]   May 29, 1951  [id number]  Primary Physician Aletha Halim., PA-C Primary Cardiologist: Lorretta Harp MD FACP, [place], [place], [place]  HPI:  Jerry [name] is a 67 y.o. moderately overweight widowed Caucasian male father of 39, grandfather and 2 grandchildren who has been disabled since 1999. He is referred by Leonia Reader, PA-C were revascularized evaluation and clearance for his upcoming left total knee replacement. He had seen Dr. Mare Ferrari in the past. He has a history discontinued tobacco abuse, treated hypertension and hyperlipidemia. He apparently had a circumflex obtuse marginal branch stent back in 1997. He was admitted to the hospital 08/28/17 with for dizziness. Extensive workup during that hospitalization all of which was unrevealing. He was seen by Dr. Sallyanne Kuster who felt that his symptoms were orthostatic and his medications were adjusted. He is also scheduled for elective left total knee replacement by Dr. Berenice Primas next week.  Current Meds  Medication Sig  . aspirin EC 81 MG tablet Take 81 mg by mouth daily.  . calcium carbonate (TUMS - DOSED IN MG ELEMENTAL CALCIUM) 500 MG chewable tablet Chew 1 tablet by mouth daily as needed for indigestion or heartburn.   . cholecalciferol (VITAMIN D) 1000 units tablet Take 1,000 Units by mouth daily.  Marland Kitchen ibuprofen (ADVIL,MOTRIN) 200 MG tablet Take 200 mg by mouth every 8 (eight) hours as needed for mild pain.  Marland Kitchen lovastatin (MEVACOR) 20 MG tablet Take 1 tablet (20 mg total) by mouth at bedtime.  . Multiple Vitamin (MULTIVITAMIN WITH MINERALS) TABS tablet Take 1 tablet by mouth daily. FOR SENIORS  . naloxegol oxalate (MOVANTIK) 25 MG TABS tablet Take 12.5 mg by mouth daily.  Marland Kitchen oxycodone (ROXICODONE) 30 MG immediate release tablet Take 30 mg by mouth 4 (four) times daily. For pain.  . pantoprazole (PROTONIX) 40 MG tablet Take 1 tablet (40 mg total) by mouth 2 (two) times daily.  . polyethylene glycol (MIRALAX /  GLYCOLAX) packet Take 17 g by mouth daily.  . promethazine (PHENERGAN) 25 MG tablet Take 25 mg by mouth daily as needed for nausea. For nausea.  . silodosin (RAPAFLO) 8 MG CAPS capsule Take 1 capsule (8 mg total) by mouth daily with breakfast.  . traZODone (DESYREL) 150 MG tablet Take 150-300 mg by mouth at bedtime. Takes 1.5 tablets  . vitamin C (ASCORBIC ACID) 500 MG tablet Take 500 mg by mouth daily.  Marland Kitchen zinc gluconate 50 MG tablet Take 50 mg by mouth daily.     Allergies  Allergen Reactions  . Ciprofloxacin Anaphylaxis  . Penicillins Rash    Has patient had a PCN reaction causing immediate rash, facial/tongue/throat swelling, SOB or lightheadedness with hypotension:unsure Has patient had a PCN reaction causing severe rash involving mucus membranes or skin necrosis:unsure Has patient had a PCN reaction that required hospitalization:unsure Has patient had a PCN reaction occurring within the last 10 years:NO If all of the above answers are "NO", then may proceed with Cephalosporin use.   . Tramadol Hcl Rash    Social History   Socioeconomic History  . Marital status: Widowed    Spouse name: Not on file  . Number of children: Not on file  . Years of education: Not on file  . Highest education level: Not on file  Social Needs  . Financial resource strain: Not on file  . Food insecurity - worry: Not on file  . Food insecurity - inability: Not on file  . Transportation needs - medical:  Not on file  . Transportation needs - non-medical: Not on file  Occupational History  . Occupation: Disabled   Tobacco Use  . Smoking status: Former Smoker    Packs/day: 0.25    Years: 33.00    Pack years: 8.25    Types: Cigarettes    Last attempt to quit: 05/05/2015    Years since quitting: 2.4  . Smokeless tobacco: Never Used  Substance and Sexual Activity  . Alcohol use: No    Alcohol/week: 0.0 oz  . Drug use: No  . Sexual activity: No  Other Topics Concern  . Not on file  Social  History Narrative  . Not on file     Review of Systems: General: negative for chills, fever, night sweats or weight changes.  Cardiovascular: negative for chest pain, dyspnea on exertion, edema, orthopnea, palpitations, paroxysmal nocturnal dyspnea or shortness of breath Dermatological: negative for rash Respiratory: negative for cough or wheezing Urologic: negative for hematuria Abdominal: negative for nausea, vomiting, diarrhea, bright red blood per rectum, melena, or hematemesis Neurologic: negative for visual changes, syncope, or dizziness All other systems reviewed and are otherwise negative except as noted above.    Blood pressure (!) 162/71, pulse 85, height 6\' 2"  (1.88 m), weight 281 lb 12.8 oz (127.8 kg).  General appearance: alert and no distress Neck: no adenopathy, no carotid bruit, no JVD, supple, symmetrical, trachea midline and thyroid not enlarged, symmetric, no tenderness/mass/nodules Lungs: clear to auscultation bilaterally Heart: regular rate and rhythm, S1, S2 normal, no murmur, click, rub or gallop Extremities: extremities normal, atraumatic, no cyanosis or edema Pulses: 2+ and symmetric Skin: Skin color, texture, turgor normal. No rashes or lesions Neurologic: Alert and oriented X 3, normal strength and tone. Normal symmetric reflexes. Normal coordination and gait  EKG not performed today  ASSESSMENT AND PLAN:   CAD (coronary artery disease) History of CAD status post circumcised obtuse marginal branch PCI and stenting back in 1997 without evidence of recurrence clinically since  Cigarette smoker Discontinued 3 years ago  Essential hypertension History of essential hypertension with blood pressures measured 162/71. He currently is not on antihypertensive medications. These were discontinued during her hospitalization in January at which time it was thought that he was orthostatic.  HLD (hyperlipidemia) History of hyperlipidemia on statin therapy with  lipid profile performed 08/29/17 revealed a total cholesterol 142, LDL 70 and HDL of 40  Dizziness History of dizziness during a hospitalization 08/28/17 for one week. it wasdetermined that he was orthostatic as noted, his medications were adjusted, and he has had no further issues.      Lorretta Harp MD FACP,FACC,FAHA, [facility] 10/23/2017 12:22 PM

## 2017-11-11 ENCOUNTER — Other Ambulatory Visit: Payer: Self-pay | Admitting: Orthopedic Surgery

## 2017-11-13 ENCOUNTER — Other Ambulatory Visit (HOSPITAL_COMMUNITY): Payer: Self-pay | Admitting: Emergency Medicine

## 2017-11-13 NOTE — Progress Notes (Signed)
LOV/cardiac clearance Dr Quay Burow 10-23-17 epic   EKG 08-28-17 epic   ECHO 08-29-17 epic  CXR 08-28-17 epic

## 2017-11-13 NOTE — Patient Instructions (Signed)
Jerry [name]  11/13/2017   Your procedure is scheduled on: 11-22-17  Report to [facility] Main  Entrance    Report to admitting at 7:45AM   Call this number if you have problems the morning of surgery [phone]   Remember: Do not eat food or drink liquids :After Midnight.     Take these medicines the morning of surgery with A SIP OF WATER: pantoprazole, oxycodone if needed, eye drops if needed                                You may not have any metal on your body including hair pins and              piercings  Do not wear jewelry, make-up, lotions, powders or perfumes, deodorant                         Men may shave face and neck.   Do not bring valuables to the hospital. [place].  Contacts, dentures or bridgework may not be worn into surgery.  Leave suitcase in the car. After surgery it may be brought to your room.                 Please read over the following fact sheets you were given: _____________________________________________________________________             [facility] - Preparing for Surgery Before surgery, you can play an important role.  Because skin is not sterile, your skin needs to be as free of germs as possible.  You can reduce the number of germs on your skin by washing with CHG (chlorahexidine gluconate) soap before surgery.  CHG is an antiseptic cleaner which kills germs and bonds with the skin to continue killing germs even after washing. Please DO NOT use if you have an allergy to CHG or antibacterial soaps.  If your skin becomes reddened/irritated stop using the CHG and inform your nurse when you arrive at Short Stay. Do not shave (including legs and underarms) for at least 48 hours prior to the first CHG shower.  You may shave your face/neck. Please follow these instructions carefully:  1.  Shower with CHG Soap the night before surgery and the  morning of Surgery.  2.   If you choose to wash your hair, wash your hair first as usual with your  normal  shampoo.  3.  After you shampoo, rinse your hair and body thoroughly to remove the  shampoo.                           4.  Use CHG as you would any other liquid soap.  You can apply chg directly  to the skin and wash                       Gently with a scrungie or clean washcloth.  5.  Apply the CHG Soap to your body ONLY FROM THE NECK DOWN.   Do not use on face/ open  Wound or open sores. Avoid contact with eyes, ears mouth and genitals (private parts).                       Wash face,  Genitals (private parts) with your normal soap.             6.  Wash thoroughly, paying special attention to the area where your surgery  will be performed.  7.  Thoroughly rinse your body with warm water from the neck down.  8.  DO NOT shower/wash with your normal soap after using and rinsing off  the CHG Soap.                9.  Pat yourself dry with a clean towel.            10.  Wear clean pajamas.            11.  Place clean sheets on your bed the night of your first shower and do not  sleep with pets. Day of Surgery : Do not apply any lotions/deodorants the morning of surgery.  Please wear clean clothes to the hospital/surgery center.  FAILURE TO FOLLOW THESE INSTRUCTIONS MAY RESULT IN THE CANCELLATION OF YOUR SURGERY PATIENT SIGNATURE_________________________________  NURSE SIGNATURE__________________________________  ________________________________________________________________________   Jerry [name]  An incentive spirometer is a tool that can help keep your lungs clear and active. This tool measures how well you are filling your lungs with each breath. Taking long deep breaths may help reverse or decrease the chance of developing breathing (pulmonary) problems (especially infection) following:  A long period of time when you are unable to move or be active. BEFORE THE PROCEDURE    If the spirometer includes an indicator to show your best effort, your nurse or respiratory therapist will set it to a desired goal.  If possible, sit up straight or lean slightly forward. Try not to slouch.  Hold the incentive spirometer in an upright position. INSTRUCTIONS FOR USE  1. Sit on the edge of your bed if possible, or sit up as far as you can in bed or on a chair. 2. Hold the incentive spirometer in an upright position. 3. Breathe out normally. 4. Place the mouthpiece in your mouth and seal your lips tightly around it. 5. Breathe in slowly and as deeply as possible, raising the piston or the ball toward the top of the column. 6. Hold your breath for 3-5 seconds or for as long as possible. Allow the piston or ball to fall to the bottom of the column. 7. Remove the mouthpiece from your mouth and breathe out normally. 8. Rest for a few seconds and repeat Steps 1 through 7 at least 10 times every 1-2 hours when you are awake. Take your time and take a few normal breaths between deep breaths. 9. The spirometer may include an indicator to show your best effort. Use the indicator as a goal to work toward during each repetition. 10. After each set of 10 deep breaths, practice coughing to be sure your lungs are clear. If you have an incision (the cut made at the time of surgery), support your incision when coughing by placing a pillow or rolled up towels firmly against it. Once you are able to get out of bed, walk around indoors and cough well. You may stop using the incentive spirometer when instructed by your caregiver.  RISKS AND COMPLICATIONS  Take your time so you do not get  dizzy or light-headed.  If you are in pain, you may need to take or ask for pain medication before doing incentive spirometry. It is harder to take a deep breath if you are having pain. AFTER USE  Rest and breathe slowly and easily.  It can be helpful to keep track of a log of your progress. Your caregiver  can provide you with a simple table to help with this. If you are using the spirometer at home, follow these instructions: [place] IF:   You are having difficultly using the spirometer.  You have trouble using the spirometer as often as instructed.  Your pain medication is not giving enough relief while using the spirometer.  You develop fever of 100.5 F (38.1 C) or higher. SEEK IMMEDIATE MEDICAL CARE IF:   You cough up bloody sputum that had not been present before.  You develop fever of 102 F (38.9 C) or greater.  You develop worsening pain at or near the incision site. MAKE SURE YOU:   Understand these instructions.  Will watch your condition.  Will get help right away if you are not doing well or get worse. Document Released: 12/03/2006 Document Revised: 10/15/2011 Document Reviewed: 02/03/2007 [facility] Patient Information 2014 [place], [place].   ________________________________________________________________________

## 2017-11-15 ENCOUNTER — Encounter (HOSPITAL_COMMUNITY): Payer: Self-pay

## 2017-11-15 ENCOUNTER — Other Ambulatory Visit: Payer: Self-pay

## 2017-11-15 ENCOUNTER — Encounter (HOSPITAL_COMMUNITY)
Admission: RE | Admit: 2017-11-15 | Discharge: 2017-11-15 | Disposition: A | Discharge status: Home / Self Care | Payer: Medicare HMO | Source: Ambulatory Visit | Attending: Orthopedic Surgery | Admitting: Orthopedic Surgery

## 2017-11-15 DIAGNOSIS — E785 Hyperlipidemia, unspecified: Secondary | ICD-10-CM | POA: Diagnosis not present

## 2017-11-15 DIAGNOSIS — Z01812 Encounter for preprocedural laboratory examination: Secondary | ICD-10-CM | POA: Insufficient documentation

## 2017-11-15 DIAGNOSIS — Z87891 Personal history of nicotine dependence: Secondary | ICD-10-CM | POA: Diagnosis not present

## 2017-11-15 DIAGNOSIS — I10 Essential (primary) hypertension: Secondary | ICD-10-CM | POA: Insufficient documentation

## 2017-11-15 DIAGNOSIS — R42 Dizziness and giddiness: Secondary | ICD-10-CM | POA: Diagnosis not present

## 2017-11-15 DIAGNOSIS — I2510 Atherosclerotic heart disease of native coronary artery without angina pectoris: Secondary | ICD-10-CM | POA: Diagnosis not present

## 2017-11-15 DIAGNOSIS — M1712 Unilateral primary osteoarthritis, left knee: Secondary | ICD-10-CM | POA: Insufficient documentation

## 2017-11-15 HISTORY — DX: Prediabetes: R73.03

## 2017-11-15 LAB — BASIC METABOLIC PANEL
Anion gap: 12 (ref 5–15)
BUN: 7 mg/dL (ref 6–20)
CALCIUM: 9.4 mg/dL (ref 8.9–10.3)
CHLORIDE: 101 mmol/L (ref 101–111)
CO2: 29 mmol/L (ref 22–32)
Creatinine, Ser: 0.87 mg/dL (ref 0.61–1.24)
GFR calc non Af Amer: >60 mL/min (ref >60)
Glucose, Bld: 105 mg/dL — ABNORMAL HIGH (ref 65–99)
Potassium: 4.1 mmol/L (ref 3.5–5.1)
SODIUM: 142 mmol/L (ref 135–145)

## 2017-11-15 LAB — APTT: aPTT: 30 seconds (ref 24–36)

## 2017-11-15 LAB — CBC WITH DIFFERENTIAL/PLATELET
BASOS PCT: 0 %
Basophils Absolute: 0 10*3/uL (ref 0.0–0.1)
EOS PCT: 5 %
Eosinophils Absolute: 0.2 10*3/uL (ref 0.0–0.7)
HCT: 40 % (ref 39.0–52.0)
HEMOGLOBIN: 12.7 g/dL — AB (ref 13.0–17.0)
LYMPHS ABS: 1.6 10*3/uL (ref 0.7–4.0)
Lymphocytes Relative: 30 %
MCH: 29.4 pg (ref 26.0–34.0)
MCHC: 31.8 g/dL (ref 30.0–36.0)
MCV: 92.6 fL (ref 78.0–100.0)
MONOS PCT: 5 %
Monocytes Absolute: 0.3 10*3/uL (ref 0.1–1.0)
NEUTROS PCT: 60 %
Neutro Abs: 3.2 10*3/uL (ref 1.7–7.7)
PLATELETS: 202 10*3/uL (ref 150–400)
RBC: 4.32 MIL/uL (ref 4.22–5.81)
RDW: 12.7 % (ref 11.5–15.5)
WBC: 5.4 10*3/uL (ref 4.0–10.5)

## 2017-11-15 LAB — URINALYSIS, ROUTINE W REFLEX MICROSCOPIC
Bilirubin Urine: NEGATIVE
GLUCOSE, UA: NEGATIVE mg/dL
Hgb urine dipstick: NEGATIVE
Ketones, ur: NEGATIVE mg/dL
LEUKOCYTES UA: NEGATIVE
NITRITE: NEGATIVE
PH: 5 (ref 5.0–8.0)
Protein, ur: NEGATIVE mg/dL
SPECIFIC GRAVITY, URINE: 1.002 — AB (ref 1.005–1.030)

## 2017-11-15 LAB — PROTIME-INR
INR: 0.97
PROTHROMBIN TIME: 12.8 s (ref 11.4–15.2)

## 2017-11-15 LAB — HEMOGLOBIN A1C
Hgb A1c MFr Bld: 5 % (ref 4.8–5.6)
MEAN PLASMA GLUCOSE: 96.8 mg/dL

## 2017-11-15 LAB — SURGICAL PCR SCREEN
MRSA, PCR: NEGATIVE
STAPHYLOCOCCUS AUREUS: POSITIVE — AB

## 2017-11-18 LAB — TYPE AND SCREEN
ABO/RH(D): A NEG
Antibody Screen: POSITIVE
PT AG Type: NEGATIVE

## 2017-11-21 MED ORDER — TRANEXAMIC ACID 1000 MG/10ML IV SOLN
1000.0000 mg | INTRAVENOUS | Status: AC
  Administered 2017-11-22: 1000 mg via INTRAVENOUS
  Filled 2017-11-21: qty 1100
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
## 2017-11-22 ENCOUNTER — Inpatient Hospital Stay (HOSPITAL_COMMUNITY): Payer: Medicare HMO | Admitting: Anesthesiology

## 2017-11-22 ENCOUNTER — Inpatient Hospital Stay (HOSPITAL_COMMUNITY)
Admission: RE | Admit: 2017-11-22 | Discharge: 2017-11-27 | DRG: 470 | Disposition: A | Discharge status: Home / Self Care | Payer: Medicare HMO | Source: Ambulatory Visit | Attending: Orthopedic Surgery | Admitting: Orthopedic Surgery

## 2017-11-22 ENCOUNTER — Encounter (HOSPITAL_COMMUNITY): Payer: Self-pay | Admitting: *Deleted

## 2017-11-22 ENCOUNTER — Other Ambulatory Visit: Payer: Self-pay

## 2017-11-22 ENCOUNTER — Encounter (HOSPITAL_COMMUNITY)
Admission: RE | Disposition: A | Discharge status: Home / Self Care | Payer: Self-pay | Source: Ambulatory Visit | Attending: Orthopedic Surgery

## 2017-11-22 DIAGNOSIS — I110 Hypertensive heart disease with heart failure: Secondary | ICD-10-CM | POA: Diagnosis present

## 2017-11-22 DIAGNOSIS — F418 Other specified anxiety disorders: Secondary | ICD-10-CM | POA: Diagnosis present

## 2017-11-22 DIAGNOSIS — R7303 Prediabetes: Secondary | ICD-10-CM | POA: Diagnosis present

## 2017-11-22 DIAGNOSIS — K5903 Drug induced constipation: Secondary | ICD-10-CM | POA: Diagnosis present

## 2017-11-22 DIAGNOSIS — I252 Old myocardial infarction: Secondary | ICD-10-CM

## 2017-11-22 DIAGNOSIS — F329 Major depressive disorder, single episode, unspecified: Secondary | ICD-10-CM | POA: Diagnosis present

## 2017-11-22 DIAGNOSIS — M25762 Osteophyte, left knee: Secondary | ICD-10-CM | POA: Diagnosis present

## 2017-11-22 DIAGNOSIS — Z87891 Personal history of nicotine dependence: Secondary | ICD-10-CM

## 2017-11-22 DIAGNOSIS — G894 Chronic pain syndrome: Secondary | ICD-10-CM | POA: Diagnosis present

## 2017-11-22 DIAGNOSIS — Z7982 Long term (current) use of aspirin: Secondary | ICD-10-CM | POA: Diagnosis not present

## 2017-11-22 DIAGNOSIS — I2510 Atherosclerotic heart disease of native coronary artery without angina pectoris: Secondary | ICD-10-CM | POA: Diagnosis present

## 2017-11-22 DIAGNOSIS — Z9049 Acquired absence of other specified parts of digestive tract: Secondary | ICD-10-CM

## 2017-11-22 DIAGNOSIS — K219 Gastro-esophageal reflux disease without esophagitis: Secondary | ICD-10-CM | POA: Diagnosis present

## 2017-11-22 DIAGNOSIS — M810 Age-related osteoporosis without current pathological fracture: Secondary | ICD-10-CM | POA: Diagnosis present

## 2017-11-22 DIAGNOSIS — F419 Anxiety disorder, unspecified: Secondary | ICD-10-CM | POA: Diagnosis present

## 2017-11-22 DIAGNOSIS — Z96651 Presence of right artificial knee joint: Secondary | ICD-10-CM | POA: Diagnosis present

## 2017-11-22 DIAGNOSIS — Z955 Presence of coronary angioplasty implant and graft: Secondary | ICD-10-CM

## 2017-11-22 DIAGNOSIS — Z881 Allergy status to other antibiotic agents status: Secondary | ICD-10-CM | POA: Diagnosis not present

## 2017-11-22 DIAGNOSIS — Z6835 Body mass index (BMI) 35.0-35.9, adult: Secondary | ICD-10-CM | POA: Diagnosis not present

## 2017-11-22 DIAGNOSIS — Z79899 Other long term (current) drug therapy: Secondary | ICD-10-CM | POA: Diagnosis not present

## 2017-11-22 DIAGNOSIS — I5032 Chronic diastolic (congestive) heart failure: Secondary | ICD-10-CM | POA: Diagnosis present

## 2017-11-22 DIAGNOSIS — Z9689 Presence of other specified functional implants: Secondary | ICD-10-CM | POA: Diagnosis present

## 2017-11-22 DIAGNOSIS — Z880 Allergy status to penicillin: Secondary | ICD-10-CM | POA: Diagnosis not present

## 2017-11-22 DIAGNOSIS — Z79891 Long term (current) use of opiate analgesic: Secondary | ICD-10-CM

## 2017-11-22 DIAGNOSIS — Z01818 Encounter for other preprocedural examination: Secondary | ICD-10-CM

## 2017-11-22 DIAGNOSIS — M1712 Unilateral primary osteoarthritis, left knee: Secondary | ICD-10-CM | POA: Diagnosis present

## 2017-11-22 DIAGNOSIS — E785 Hyperlipidemia, unspecified: Secondary | ICD-10-CM | POA: Diagnosis present

## 2017-11-22 HISTORY — PX: TOTAL KNEE ARTHROPLASTY: SHX125

## 2017-11-22 SURGERY — ARTHROPLASTY, KNEE, TOTAL
Anesthesia: General | Site: Knee | Laterality: Left

## 2017-11-22 MED ORDER — METOCLOPRAMIDE HCL 5 MG/ML IJ SOLN: 10.0000 mg | Freq: Once | INTRAMUSCULAR | Status: DC | PRN

## 2017-11-22 MED ORDER — ROPIVACAINE HCL 7.5 MG/ML IJ SOLN
INTRAMUSCULAR | Status: DC | PRN
  Administered 2017-11-22: 20 mL via PERINEURAL

## 2017-11-22 MED ORDER — LIDOCAINE 2% (20 MG/ML) 5 ML SYRINGE
INTRAMUSCULAR | Status: AC
  Filled 2017-11-22: qty 5

## 2017-11-22 MED ORDER — PHENYLEPHRINE HCL 10 MG/ML IJ SOLN
INTRAMUSCULAR | Status: DC | PRN
  Administered 2017-11-22: 80 ug via INTRAVENOUS

## 2017-11-22 MED ORDER — ONDANSETRON HCL 4 MG/2ML IJ SOLN
INTRAMUSCULAR | Status: DC | PRN
  Administered 2017-11-22: 4 mg via INTRAVENOUS

## 2017-11-22 MED ORDER — FENTANYL CITRATE (PF) 100 MCG/2ML IJ SOLN
INTRAMUSCULAR | Status: AC
  Filled 2017-11-22: qty 2

## 2017-11-22 MED ORDER — ONDANSETRON HCL 4 MG PO TABS
4.0000 mg | ORAL_TABLET | Freq: Four times a day (QID) | ORAL | Status: DC | PRN
  Administered 2017-11-26 (×2): 4 mg via ORAL
  Filled 2017-11-22 (×2): qty 1

## 2017-11-22 MED ORDER — SODIUM CHLORIDE 0.9 % IR SOLN
Status: DC | PRN
  Administered 2017-11-22: 2000 mL

## 2017-11-22 MED ORDER — CHLORHEXIDINE GLUCONATE 4 % EX LIQD: 60.0000 mL | Freq: Once | CUTANEOUS | Status: DC

## 2017-11-22 MED ORDER — FERROUS SULFATE 325 (65 FE) MG PO TABS
325.0000 mg | ORAL_TABLET | Freq: Every day | ORAL | Status: DC
  Administered 2017-11-23 – 2017-11-27 (×5): 325 mg via ORAL
  Filled 2017-11-22 (×5): qty 1

## 2017-11-22 MED ORDER — TRANEXAMIC ACID 1000 MG/10ML IV SOLN
1000.0000 mg | Freq: Once | INTRAVENOUS | Status: AC
  Administered 2017-11-22: 1000 mg via INTRAVENOUS
  Filled 2017-11-22: qty 1100

## 2017-11-22 MED ORDER — CLINDAMYCIN PHOSPHATE 900 MG/50ML IV SOLN
900.0000 mg | INTRAVENOUS | Status: AC
  Administered 2017-11-22: 900 mg via INTRAVENOUS
  Filled 2017-11-22: qty 50

## 2017-11-22 MED ORDER — DIPHENHYDRAMINE HCL 12.5 MG/5ML PO ELIX: 12.5000 mg | ORAL_SOLUTION | ORAL | Status: DC | PRN

## 2017-11-22 MED ORDER — FENTANYL CITRATE (PF) 100 MCG/2ML IJ SOLN
25.0000 ug | INTRAMUSCULAR | Status: DC | PRN
  Administered 2017-11-22 (×4): 25 ug via INTRAVENOUS

## 2017-11-22 MED ORDER — MEPERIDINE HCL 50 MG/ML IJ SOLN: 6.2500 mg | INTRAMUSCULAR | Status: DC | PRN

## 2017-11-22 MED ORDER — METHOCARBAMOL 1000 MG/10ML IJ SOLN
500.0000 mg | Freq: Four times a day (QID) | INTRAMUSCULAR | Status: DC | PRN
Start: 2017-11-22 — End: 2017-11-27
  Administered 2017-11-22: 500 mg via INTRAVENOUS
  Filled 2017-11-22: qty 550

## 2017-11-22 MED ORDER — ASPIRIN EC 325 MG PO TBEC: 325.0000 mg | DELAYED_RELEASE_TABLET | Freq: Two times a day (BID) | ORAL | 0 refills | Status: DC

## 2017-11-22 MED ORDER — SUCCINYLCHOLINE CHLORIDE 200 MG/10ML IV SOSY
PREFILLED_SYRINGE | INTRAVENOUS | Status: AC
  Filled 2017-11-22: qty 10

## 2017-11-22 MED ORDER — OXYCODONE-ACETAMINOPHEN 5-325 MG PO TABS: 1.0000 | ORAL_TABLET | Freq: Four times a day (QID) | ORAL | 0 refills | Status: DC | PRN

## 2017-11-22 MED ORDER — FENTANYL CITRATE (PF) 100 MCG/2ML IJ SOLN
50.0000 ug | INTRAMUSCULAR | Status: DC
  Administered 2017-11-22: 50 ug via INTRAVENOUS
  Filled 2017-11-22: qty 2

## 2017-11-22 MED ORDER — MIDAZOLAM HCL 2 MG/2ML IJ SOLN
1.0000 mg | INTRAMUSCULAR | Status: DC
  Administered 2017-11-22: 1 mg via INTRAVENOUS
  Filled 2017-11-22: qty 2

## 2017-11-22 MED ORDER — ALUM & MAG HYDROXIDE-SIMETH 200-200-20 MG/5ML PO SUSP: 30.0000 mL | ORAL | Status: DC | PRN

## 2017-11-22 MED ORDER — POLYETHYLENE GLYCOL 3350 17 G PO PACK
17.0000 g | PACK | Freq: Every day | ORAL | Status: DC
  Administered 2017-11-23 – 2017-11-27 (×5): 17 g via ORAL
  Filled 2017-11-22 (×6): qty 1

## 2017-11-22 MED ORDER — PROPOFOL 10 MG/ML IV BOLUS
INTRAVENOUS | Status: DC | PRN
  Administered 2017-11-22: 180 mg via INTRAVENOUS

## 2017-11-22 MED ORDER — LACTATED RINGERS IV SOLN
INTRAVENOUS | Status: DC
  Administered 2017-11-22 (×2): via INTRAVENOUS

## 2017-11-22 MED ORDER — STERILE WATER FOR IRRIGATION IR SOLN
Status: DC | PRN
  Administered 2017-11-22: 2000 mL

## 2017-11-22 MED ORDER — MAGNESIUM CITRATE PO SOLN
1.0000 | Freq: Once | ORAL | Status: AC | PRN
  Administered 2017-11-25: 1 via ORAL
  Filled 2017-11-22: qty 296

## 2017-11-22 MED ORDER — ONDANSETRON HCL 4 MG/2ML IJ SOLN
INTRAMUSCULAR | Status: AC
  Filled 2017-11-22: qty 2

## 2017-11-22 MED ORDER — PROPOFOL 10 MG/ML IV BOLUS
INTRAVENOUS | Status: AC
  Filled 2017-11-22: qty 20

## 2017-11-22 MED ORDER — NALOXEGOL OXALATE 12.5 MG PO TABS
12.5000 mg | ORAL_TABLET | Freq: Every day | ORAL | Status: DC
  Administered 2017-11-22 – 2017-11-27 (×6): 12.5 mg via ORAL
  Filled 2017-11-22 (×6): qty 1

## 2017-11-22 MED ORDER — ZOLPIDEM TARTRATE 5 MG PO TABS
5.0000 mg | ORAL_TABLET | Freq: Every evening | ORAL | Status: DC | PRN
  Administered 2017-11-25 – 2017-11-27 (×2): 5 mg via ORAL
  Filled 2017-11-22 (×2): qty 1

## 2017-11-22 MED ORDER — PRAVASTATIN SODIUM 20 MG PO TABS
10.0000 mg | ORAL_TABLET | Freq: Every day | ORAL | Status: DC
  Administered 2017-11-22 – 2017-11-26 (×5): 10 mg via ORAL
  Filled 2017-11-22 (×5): qty 1

## 2017-11-22 MED ORDER — METHOCARBAMOL 500 MG PO TABS
500.0000 mg | ORAL_TABLET | Freq: Four times a day (QID) | ORAL | Status: DC | PRN
Start: 2017-11-22 — End: 2017-11-27
  Administered 2017-11-23 – 2017-11-27 (×15): 500 mg via ORAL
  Filled 2017-11-22 (×15): qty 1

## 2017-11-22 MED ORDER — CLONIDINE HCL (ANALGESIA) 100 MCG/ML EP SOLN
EPIDURAL | Status: DC | PRN
  Administered 2017-11-22: 100 ug

## 2017-11-22 MED ORDER — ONDANSETRON HCL 4 MG/2ML IJ SOLN: 4.0000 mg | Freq: Four times a day (QID) | INTRAMUSCULAR | Status: DC | PRN

## 2017-11-22 MED ORDER — MORPHINE SULFATE (PF) 2 MG/ML IV SOLN
1.0000 mg | INTRAVENOUS | Status: DC | PRN
  Administered 2017-11-22: 2 mg via INTRAVENOUS
  Administered 2017-11-22: 1 mg via INTRAVENOUS
  Administered 2017-11-22 – 2017-11-24 (×9): 2 mg via INTRAVENOUS
  Administered 2017-11-24: 1 mg via INTRAVENOUS
  Administered 2017-11-24 – 2017-11-25 (×2): 2 mg via INTRAVENOUS
  Filled 2017-11-22 (×14): qty 1

## 2017-11-22 MED ORDER — TAMSULOSIN HCL 0.4 MG PO CAPS
0.4000 mg | ORAL_CAPSULE | Freq: Every day | ORAL | Status: DC
  Administered 2017-11-23 – 2017-11-27 (×5): 0.4 mg via ORAL
  Filled 2017-11-22 (×5): qty 1

## 2017-11-22 MED ORDER — FENTANYL CITRATE (PF) 100 MCG/2ML IJ SOLN
INTRAMUSCULAR | Status: AC
  Administered 2017-11-22: 25 ug via INTRAVENOUS
  Filled 2017-11-22: qty 2

## 2017-11-22 MED ORDER — SODIUM CHLORIDE 0.9% FLUSH
INTRAVENOUS | Status: DC | PRN
  Administered 2017-11-22: 30 mL

## 2017-11-22 MED ORDER — PANTOPRAZOLE SODIUM 40 MG PO TBEC
40.0000 mg | DELAYED_RELEASE_TABLET | Freq: Two times a day (BID) | ORAL | Status: DC
  Administered 2017-11-22 – 2017-11-27 (×10): 40 mg via ORAL
  Filled 2017-11-22 (×10): qty 1

## 2017-11-22 MED ORDER — OXYCODONE HCL 20 MG PO TABS: 20.0000 mg | ORAL_TABLET | Freq: Four times a day (QID) | ORAL | Status: DC | PRN

## 2017-11-22 MED ORDER — ASPIRIN EC 325 MG PO TBEC
325.0000 mg | DELAYED_RELEASE_TABLET | Freq: Two times a day (BID) | ORAL | Status: DC
  Administered 2017-11-23 – 2017-11-27 (×9): 325 mg via ORAL
  Filled 2017-11-22 (×9): qty 1

## 2017-11-22 MED ORDER — SUGAMMADEX SODIUM 200 MG/2ML IV SOLN
INTRAVENOUS | Status: AC
  Filled 2017-11-22: qty 2

## 2017-11-22 MED ORDER — ACETAMINOPHEN 325 MG PO TABS
325.0000 mg | ORAL_TABLET | Freq: Four times a day (QID) | ORAL | Status: DC | PRN
Start: 2017-11-23 — End: 2017-11-24

## 2017-11-22 MED ORDER — DOCUSATE SODIUM 100 MG PO CAPS
100.0000 mg | ORAL_CAPSULE | Freq: Two times a day (BID) | ORAL | Status: DC
  Administered 2017-11-22 – 2017-11-27 (×10): 100 mg via ORAL
  Filled 2017-11-22 (×10): qty 1

## 2017-11-22 MED ORDER — PHENYLEPHRINE 40 MCG/ML (10ML) SYRINGE FOR IV PUSH (FOR BLOOD PRESSURE SUPPORT)
PREFILLED_SYRINGE | INTRAVENOUS | Status: AC
  Filled 2017-11-22: qty 10

## 2017-11-22 MED ORDER — ROCURONIUM BROMIDE 10 MG/ML (PF) SYRINGE
PREFILLED_SYRINGE | INTRAVENOUS | Status: AC
  Filled 2017-11-22: qty 5

## 2017-11-22 MED ORDER — LIDOCAINE 2% (20 MG/ML) 5 ML SYRINGE
INTRAMUSCULAR | Status: DC | PRN
  Administered 2017-11-22: 30 mg via INTRAVENOUS

## 2017-11-22 MED ORDER — DEXAMETHASONE SODIUM PHOSPHATE 10 MG/ML IJ SOLN
10.0000 mg | Freq: Two times a day (BID) | INTRAMUSCULAR | Status: AC
  Administered 2017-11-22 – 2017-11-23 (×3): 10 mg via INTRAVENOUS
  Filled 2017-11-22 (×3): qty 1

## 2017-11-22 MED ORDER — BUPIVACAINE-EPINEPHRINE (PF) 0.5% -1:200000 IJ SOLN
INTRAMUSCULAR | Status: AC
  Filled 2017-11-22: qty 30

## 2017-11-22 MED ORDER — BUPIVACAINE LIPOSOME 1.3 % IJ SUSP
INTRAMUSCULAR | Status: DC | PRN
  Administered 2017-11-22: 20 mL

## 2017-11-22 MED ORDER — POLYETHYLENE GLYCOL 3350 17 G PO PACK
17.0000 g | PACK | Freq: Every day | ORAL | Status: DC | PRN
  Administered 2017-11-24: 17 g via ORAL
  Filled 2017-11-22: qty 1

## 2017-11-22 MED ORDER — CLINDAMYCIN PHOSPHATE 600 MG/50ML IV SOLN
600.0000 mg | Freq: Four times a day (QID) | INTRAVENOUS | Status: AC
  Administered 2017-11-22 (×2): 600 mg via INTRAVENOUS
  Filled 2017-11-22 (×2): qty 50

## 2017-11-22 MED ORDER — ROCURONIUM BROMIDE 10 MG/ML (PF) SYRINGE
PREFILLED_SYRINGE | INTRAVENOUS | Status: DC | PRN
  Administered 2017-11-22: 40 mg via INTRAVENOUS

## 2017-11-22 MED ORDER — OXYCODONE HCL 5 MG PO TABS
5.0000 mg | ORAL_TABLET | ORAL | Status: DC | PRN
  Administered 2017-11-22: 10 mg via ORAL
  Filled 2017-11-22 (×2): qty 2

## 2017-11-22 MED ORDER — FUROSEMIDE 20 MG PO TABS
20.0000 mg | ORAL_TABLET | Freq: Every day | ORAL | Status: DC
  Administered 2017-11-22 – 2017-11-27 (×6): 20 mg via ORAL
  Filled 2017-11-22 (×6): qty 1

## 2017-11-22 MED ORDER — 0.9 % SODIUM CHLORIDE (POUR BTL) OPTIME
TOPICAL | Status: DC | PRN
  Administered 2017-11-22: 1000 mL

## 2017-11-22 MED ORDER — SODIUM CHLORIDE 0.9 % IV SOLN
INTRAVENOUS | Status: DC
  Administered 2017-11-22: 15:00:00 via INTRAVENOUS

## 2017-11-22 MED ORDER — BUPIVACAINE-EPINEPHRINE 0.5% -1:200000 IJ SOLN
INTRAMUSCULAR | Status: DC | PRN
  Administered 2017-11-22: 30 mL

## 2017-11-22 MED ORDER — SUGAMMADEX SODIUM 200 MG/2ML IV SOLN
INTRAVENOUS | Status: DC | PRN
  Administered 2017-11-22: 200 mg via INTRAVENOUS

## 2017-11-22 MED ORDER — BISACODYL 5 MG PO TBEC
5.0000 mg | DELAYED_RELEASE_TABLET | Freq: Every day | ORAL | Status: DC | PRN
  Administered 2017-11-25: 5 mg via ORAL
  Filled 2017-11-22: qty 1

## 2017-11-22 MED ORDER — FENTANYL CITRATE (PF) 100 MCG/2ML IJ SOLN
INTRAMUSCULAR | Status: DC | PRN
  Administered 2017-11-22 (×8): 50 ug via INTRAVENOUS
  Administered 2017-11-22: 100 ug via INTRAVENOUS

## 2017-11-22 MED ORDER — GABAPENTIN 300 MG PO CAPS
300.0000 mg | ORAL_CAPSULE | Freq: Two times a day (BID) | ORAL | Status: DC
  Administered 2017-11-22 – 2017-11-27 (×11): 300 mg via ORAL
  Filled 2017-11-22 (×11): qty 1

## 2017-11-22 MED ORDER — BUPIVACAINE LIPOSOME 1.3 % IJ SUSP
20.0000 mL | Freq: Once | INTRAMUSCULAR | Status: DC
Start: 2017-11-22 — End: 2017-11-22
  Filled 2017-11-22: qty 20

## 2017-11-22 SURGICAL SUPPLY — 53 items
APL SKNCLS STERI-STRIP NONHPOA (GAUZE/BANDAGES/DRESSINGS) ×1
BAG SPEC THK2 15X12 ZIP CLS (MISCELLANEOUS) ×1
BAG ZIPLOCK 12X15 (MISCELLANEOUS) ×2 IMPLANT
BANDAGE ACE 6X5 VEL STRL LF (GAUZE/BANDAGES/DRESSINGS) ×2 IMPLANT
BENZOIN TINCTURE PRP APPL 2/3 (GAUZE/BANDAGES/DRESSINGS) ×2 IMPLANT
BLADE SAG 18X100X1.27 (BLADE) ×2 IMPLANT
BLADE SAW SGTL 13.0X1.19X90.0M (BLADE) ×2 IMPLANT
BOOTIES KNEE HIGH SLOAN (MISCELLANEOUS) ×2 IMPLANT
BOWL SMART MIX CTS (DISPOSABLE) ×2 IMPLANT
CAPT KNEE TOTAL 3 ATTUNE ×1 IMPLANT
CEMENT HV SMART SET (Cement) ×4 IMPLANT
CUFF TOURN SGL QUICK 34 (TOURNIQUET CUFF) ×2
CUFF TRNQT CYL 34X4X40X1 (TOURNIQUET CUFF) ×1 IMPLANT
DRAPE U-SHAPE 47X51 STRL (DRAPES) ×2 IMPLANT
DRESSING AQUACEL AG SP 3.5X10 (GAUZE/BANDAGES/DRESSINGS) IMPLANT
DRSG ADAPTIC 3X8 NADH LF (GAUZE/BANDAGES/DRESSINGS) ×1 IMPLANT
DRSG AQUACEL AG SP 3.5X10 (GAUZE/BANDAGES/DRESSINGS) ×2
DRSG PAD ABDOMINAL 8X10 ST (GAUZE/BANDAGES/DRESSINGS) ×2 IMPLANT
DURAPREP 26ML APPLICATOR (WOUND CARE) ×2 IMPLANT
ELECT REM PT RETURN 15FT ADLT (MISCELLANEOUS) ×2 IMPLANT
GAUZE SPONGE 4X4 12PLY STRL (GAUZE/BANDAGES/DRESSINGS) ×2 IMPLANT
GLOVE BIOGEL PI IND STRL 7.5 (GLOVE) IMPLANT
GLOVE BIOGEL PI IND STRL 8 (GLOVE) ×2 IMPLANT
GLOVE BIOGEL PI INDICATOR 7.5 (GLOVE) ×4
GLOVE BIOGEL PI INDICATOR 8 (GLOVE) ×3
GLOVE ECLIPSE 6.5 STRL STRAW (GLOVE) ×1 IMPLANT
GLOVE ECLIPSE 7.5 STRL STRAW (GLOVE) ×4 IMPLANT
GLOVE INDICATOR 6.5 STRL GRN (GLOVE) ×1 IMPLANT
GOWN STRL REUS W/TWL XL LVL3 (GOWN DISPOSABLE) ×6 IMPLANT
HANDPIECE INTERPULSE COAX TIP (DISPOSABLE) ×2
HOOD PEEL AWAY FLYTE STAYCOOL (MISCELLANEOUS) ×6 IMPLANT
IMMOBILIZER KNEE 20 (SOFTGOODS) ×2
IMMOBILIZER KNEE 20 THIGH 36 (SOFTGOODS) ×1 IMPLANT
MANIFOLD NEPTUNE II (INSTRUMENTS) ×2 IMPLANT
NEEDLE HYPO 22GX1.5 SAFETY (NEEDLE) ×2 IMPLANT
PACK ICE MAXI GEL EZY WRAP (MISCELLANEOUS) ×2 IMPLANT
PACK TOTAL KNEE CUSTOM (KITS) ×2 IMPLANT
PAD ABD 8X10 STRL (GAUZE/BANDAGES/DRESSINGS) ×1 IMPLANT
PADDING CAST COTTON 6X4 STRL (CAST SUPPLIES) ×2 IMPLANT
POSITIONER SURGICAL ARM (MISCELLANEOUS) ×2 IMPLANT
SET HNDPC FAN SPRY TIP SCT (DISPOSABLE) ×1 IMPLANT
STAPLER VISISTAT 35W (STAPLE) ×1 IMPLANT
SUT MNCRL AB 3-0 PS2 18 (SUTURE) ×2 IMPLANT
SUT VIC AB 0 CT1 36 (SUTURE) ×2 IMPLANT
SUT VIC AB 1 CT1 36 (SUTURE) ×4 IMPLANT
SUT VIC AB 2-0 CT1 27 (SUTURE) ×4
SUT VIC AB 2-0 CT1 TAPERPNT 27 (SUTURE) ×2 IMPLANT
SWABSTK COMLB BENZOIN TINCTURE (MISCELLANEOUS) ×2 IMPLANT
SYR CONTROL 10ML LL (SYRINGE) ×4 IMPLANT
TOWEL OR NON WOVEN STRL DISP B (DISPOSABLE) ×2 IMPLANT
TRAY FOLEY W/METER SILVER 16FR (SET/KITS/TRAYS/PACK) ×2 IMPLANT
WRAP KNEE MAXI GEL POST OP (GAUZE/BANDAGES/DRESSINGS) ×1 IMPLANT
YANKAUER SUCT BULB TIP 10FT TU (MISCELLANEOUS) ×2 IMPLANT

## 2017-11-22 NOTE — Anesthesia Procedure Notes (Signed)
Procedure Name: Intubation Date/Time: 11/22/2017 10:35 AM Performed by: Anne Fu, CRNA Pre-anesthesia Checklist: Patient identified, Emergency Drugs available, Suction available, Patient being monitored and Timeout performed Patient Re-evaluated:Patient Re-evaluated prior to induction Oxygen Delivery Method: Circle system utilized Preoxygenation: Pre-oxygenation with 100% oxygen Induction Type: IV induction Ventilation: Mask ventilation without difficulty Laryngoscope Size: Mac and 4 Grade View: Grade I Tube type: Oral Tube size: 7.5 mm Number of attempts: 1 Airway Equipment and Method: Stylet Placement Confirmation: ETT inserted through vocal cords under direct vision,  positive ETCO2 and breath sounds checked- equal and bilateral Secured at: 21 cm Tube secured with: Tape Dental Injury: Teeth and Oropharynx as per pre-operative assessment

## 2017-11-22 NOTE — Progress Notes (Signed)
PT Cancellation Note  Patient Details Name: Jerry [name] MRN: [id number] DOB: 12-14-1950   Cancelled Treatment:    Reason Eval/Treat Not Completed: Other (comment)getting into CPM at this time.    Claretha Cooper 11/22/2017, 2:36 PM  Tresa Endo PT [phone]

## 2017-11-22 NOTE — Anesthesia Postprocedure Evaluation (Signed)
Anesthesia Post Note  Patient: AZAEL RAGAIN  Procedure(s) Performed: LEFT TOTAL KNEE ARTHROPLASTY (Left Knee)     Patient location during evaluation: PACU Anesthesia Type: General Level of consciousness: awake and alert Pain management: pain level controlled Vital Signs Assessment: post-procedure vital signs reviewed and stable Respiratory status: spontaneous breathing, nonlabored ventilation, respiratory function stable and patient connected to nasal cannula oxygen Cardiovascular status: blood pressure returned to baseline and stable Postop Assessment: no apparent nausea or vomiting Anesthetic complications: no    Last Vitals:  Vitals:   11/22/17 1556 11/22/17 1658  BP: 136/70 139/68  Pulse: 79 81  Resp: 18 18  Temp: 37.4 C   SpO2: 98% 100%    Last Pain:  Vitals:   11/22/17 1655  TempSrc:   PainSc: 6                  Montez Hageman

## 2017-11-22 NOTE — Progress Notes (Signed)
AssistedDr. Carignan with left, ultrasound guided, adductor canal block. Side rails up, monitors on throughout procedure. See vital signs in flow sheet. Tolerated Procedure well.  

## 2017-11-22 NOTE — Discharge Instructions (Signed)

## 2017-11-22 NOTE — Anesthesia Procedure Notes (Signed)
Anesthesia Regional Block: Adductor canal block   Pre-Anesthetic Checklist: ,, timeout performed, Correct Patient, Correct Site, Correct Laterality, Correct Procedure, Correct Position, site marked, Risks and benefits discussed,  Surgical consent,  Pre-op evaluation,  At surgeon's request and post-op pain management  Laterality: Left and Lower  Prep: Maximum Sterile Barrier Precautions used, chloraprep       Needles:  Injection technique: Single-shot  Needle Type: Echogenic Stimulator Needle     Needle Length: 10cm      Additional Needles:   Procedures:,,,, ultrasound used (permanent image in chart),,,,  Narrative:  Start time: 11/22/2017 10:02 AM End time: 11/22/2017 10:12 AM Injection made incrementally with aspirations every 5 mL.  Performed by: Personally  Anesthesiologist: Montez Hageman, MD  Additional Notes: Risks, benefits and alternative to block explained extensively.  Patient tolerated procedure well, without complications.

## 2017-11-22 NOTE — Brief Op Note (Signed)
11/22/2017  12:19 PM  PATIENT:  Jerry [name]  67 y.o. male  PRE-OPERATIVE DIAGNOSIS:  LEFT KNEE DEGENERATIVE JOINT DISEASE  POST-OPERATIVE DIAGNOSIS:  LEFT KNEE DEGENERATIVE JOINT DISEASE  PROCEDURE:  Procedure(s) with comments: LEFT TOTAL KNEE ARTHROPLASTY (Left) - Adductor Block  SURGEON:  Surgeon(s) and Role:    Dorna Leitz, MD - Primary  PHYSICIAN ASSISTANT:   ASSISTANTS: bethune   ANESTHESIA:   general  EBL:  200 mL   BLOOD ADMINISTERED:none  DRAINS: none   LOCAL MEDICATIONS USED:  MARCAINE    and OTHER experel  SPECIMEN:  No Specimen  DISPOSITION OF SPECIMEN:  N/A  COUNTS:  YES  TOURNIQUET:   Total Tourniquet Time Documented: Thigh (Left) - 70 minutes Total: Thigh (Left) - 70 minutes   DICTATION: .Other Dictation: Dictation Number [id number]  PLAN OF CARE: Admit to inpatient   PATIENT DISPOSITION:  PACU - hemodynamically stable.   Delay start of Pharmacological VTE agent (>24hrs) due to surgical blood loss or risk of bleeding: no

## 2017-11-22 NOTE — Plan of Care (Signed)
Pt alert and oriented, pain meds given, RN will monitor.

## 2017-11-22 NOTE — Transfer of Care (Signed)
Immediate Anesthesia Transfer of Care Note  Patient: Jerry [name]  Procedure(s) Performed: Procedure(s) with comments: LEFT TOTAL KNEE ARTHROPLASTY (Left) - Adductor Block  Patient Location: PACU  Anesthesia Type:General  Level of Consciousness:  sedated, patient cooperative and responds to stimulation  Airway & Oxygen Therapy:Patient Spontanous Breathing and Patient connected to face mask oxgen  Post-op Assessment:  Report given to PACU RN and Post -op Vital signs reviewed and stable  Post vital signs:  Reviewed and stable  Last Vitals:  Vitals:   11/22/17 1019 11/22/17 1020  BP:    Pulse: (!) 58 (!) 58  Resp: (!) 9 13  Temp:    SpO2: 25% 05%    Complications: No apparent anesthesia complications

## 2017-11-22 NOTE — Anesthesia Preprocedure Evaluation (Signed)
Anesthesia Evaluation  Patient identified by MRN, date of birth, ID band Patient awake    Reviewed: Allergy & Precautions, H&P , NPO status , Patient's Chart, lab work & pertinent test results  History of Anesthesia Complications (+) POST - OP SPINAL HEADACHENegative for: history of anesthetic complications  Airway Mallampati: II  TM Distance: >3 FB Neck ROM: full    Dental  (+) Dental Advisory Given   Pulmonary shortness of breath, pneumonia, former smoker,    breath sounds clear to auscultation       Cardiovascular hypertension, Pt. on medications + CAD and +CHF   Rhythm:regular Rate:Normal  TTE (08/2016): EF 55%  TTE 08/2017: LVSF WNL   Neuro/Psych PSYCHIATRIC DISORDERS Anxiety Depression  Neuromuscular disease    GI/Hepatic GERD  Medicated,  Endo/Other  Morbid obesity  Renal/GU      Musculoskeletal  (+) Arthritis ,   Abdominal   Peds  Hematology  (+) anemia ,   Anesthesia Other Findings   Reproductive/Obstetrics                             Anesthesia Physical  Anesthesia Plan  ASA: III  Anesthesia Plan: General   Post-op Pain Management:  Regional for Post-op pain   Induction: Intravenous  PONV Risk Score and Plan: 1 and Treatment may vary due to age or medical condition and Ondansetron  Airway Management Planned: Oral ETT  Additional Equipment:   Intra-op Plan:   Post-operative Plan: Extubation in OR  Informed Consent: I have reviewed the patients History and Physical, chart, labs and discussed the procedure including the risks, benefits and alternatives for the proposed anesthesia with the patient or authorized representative who has indicated his/her understanding and acceptance.   Dental advisory given  Plan Discussed with: CRNA  Anesthesia Plan Comments:         Anesthesia Quick Evaluation

## 2017-11-23 LAB — CBC
HCT: 34 % — ABNORMAL LOW (ref 39.0–52.0)
Hemoglobin: 10.8 g/dL — ABNORMAL LOW (ref 13.0–17.0)
MCH: 29.8 pg (ref 26.0–34.0)
MCHC: 31.8 g/dL (ref 30.0–36.0)
MCV: 93.7 fL (ref 78.0–100.0)
PLATELETS: 216 10*3/uL (ref 150–400)
RBC: 3.63 MIL/uL — ABNORMAL LOW (ref 4.22–5.81)
RDW: 12.9 % (ref 11.5–15.5)
WBC: 12.9 10*3/uL — AB (ref 4.0–10.5)

## 2017-11-23 LAB — BASIC METABOLIC PANEL
ANION GAP: 10 (ref 5–15)
BUN: 8 mg/dL (ref 6–20)
CO2: 28 mmol/L (ref 22–32)
Calcium: 8.8 mg/dL — ABNORMAL LOW (ref 8.9–10.3)
Chloride: 101 mmol/L (ref 101–111)
Creatinine, Ser: 0.93 mg/dL (ref 0.61–1.24)
GFR calc non Af Amer: >60 mL/min (ref >60)
Glucose, Bld: 144 mg/dL — ABNORMAL HIGH (ref 65–99)
POTASSIUM: 4.3 mmol/L (ref 3.5–5.1)
SODIUM: 139 mmol/L (ref 135–145)

## 2017-11-23 MED ORDER — OXYCODONE HCL 5 MG PO TABS
20.0000 mg | ORAL_TABLET | Freq: Four times a day (QID) | ORAL | Status: DC | PRN
Start: 2017-11-23 — End: 2017-11-24
  Administered 2017-11-23 – 2017-11-24 (×4): 20 mg via ORAL
  Filled 2017-11-23 (×4): qty 4

## 2017-11-23 NOTE — Progress Notes (Signed)
  PATIENT ID: Jerry [name]  MRN: [id number]  DOB/AGE:  67-11-1950 / 67 y.o.  1 Day Post-Op Procedure(s) (LRB): LEFT TOTAL KNEE ARTHROPLASTY (Left)  Subjective: Pain is moderate to severe.  No c/o chest pain or SOB.   Tol PO, reports no flatus yet  Has chronic back pain, with implanted spinal morphine device and baseline oxycodone 20mg   3-4 times daily  Objective: Vital signs in last 24 hours: Temp:  [98 F (36.7 C)-99.7 F (37.6 C)] 98.4 F (36.9 C) (04/20 0436) Pulse Rate:  [52-89] 73 (04/20 0436) Resp:  [9-22] 19 (04/20 0436) BP: (112-157)/(57-77) 112/57 (04/20 0436) SpO2:  [95 %-100 %] 98 % (04/20 0436)  Intake/Output from previous day: 04/19 0701 - 04/20 0700 In: 3761.7 [P.O.:695; I.V.:2806.7; IV Piggyback:260] Out: 2600 [Urine:2400; Blood:200] Intake/Output this shift: No intake/output data recorded.  Recent Labs    11/23/17 0509  HGB 10.8*   Recent Labs    11/23/17 0509  WBC 12.9*  RBC 3.63*  HCT 34.0*  PLT 216   Recent Labs    11/23/17 0509  NA 139  K 4.3  CL 101  CO2 28  BUN 8  CREATININE 0.93  GLUCOSE 144*  CALCIUM 8.8*   No results for input(s): LABPT, INR in the last 72 hours.  Physical Exam: Sensation intact distally Intact pulses distally Dorsiflexion/Plantar flexion intact Incision: dressing C/D/I  Assessment/Plan: 1 Day Post-Op Procedure(s) (LRB): LEFT TOTAL KNEE ARTHROPLASTY (Left)   Up with therapy Plan for discharge tomorrow Weight Bearing as Tolerated (WBAT)  VTE prophylaxis: SCDs & ASA Will change oxycodone to 20 q 6 and keep prn morphine  Vian Fluegel A. [place] [address], [place], [place]  [address] Office: [phone] Mobile: [phone]  11/23/2017, 8:32 AM

## 2017-11-23 NOTE — Progress Notes (Signed)
   11/23/17 1500  PT Visit Information  Last PT Received On 11/23/17  Focused on therapeutic exercise and ROM, walking deferred per pt request  Assistance Needed +2  History of Present Illness 67 yo male adm 11/22/17 for L TKA; PMHx: CHF, HTN, CAD, chronic back pain, bil TKA, back surgery  Subjective Data  Patient Stated Goal less knee pain  Precautions  Precautions Fall;Knee  Required Braces or Orthoses Knee Immobilizer - Left  Restrictions  Weight Bearing Restrictions No  Other Position/Activity Restrictions WBAT  Pain Assessment  Pain Assessment 0-10  Pain Score 4  Pain Location left knee  Pain Descriptors / Indicators Sore  Pain Intervention(s) Limited activity within patient's tolerance;Monitored during session;Repositioned  Cognition  Arousal/Alertness Awake/alert  Behavior During Therapy WFL for tasks assessed/performed  Overall Cognitive Status Within Functional Limits for tasks assessed  Bed Mobility  General bed mobility comments pt declined OOB d/t unit closing and pain elevated  Total Joint Exercises  Ankle Circles/Pumps AROM;Both;10 reps  Quad Sets AROM;Both;15 reps  Heel Slides AAROM;Left;10 reps  Hip ABduction/ADduction AROM;Left;10 reps  Straight Leg Raises AAROM;Left;10 reps  Goniometric ROM grossly 10* to 55* AAROm left knee flexion  PT - End of Session  Equipment Utilized During Treatment Gait belt;Left knee immobilizer  Activity Tolerance Patient tolerated treatment well  Patient left in bed;with call bell/phone within reach;with bed alarm set   PT - Assessment/Plan  PT Plan Current plan remains appropriate  PT Visit Diagnosis Difficulty in walking, not elsewhere classified (R26.2)  PT Frequency (ACUTE ONLY) 7X/week  Follow Up Recommendations Follow surgeon's recommendation for DC plan and follow-up therapies  PT equipment Rolling walker with 5" wheels (?pt may decline RW, has Std walker)  AM-PAC PT "6 Clicks" Daily Activity Outcome Measure  Difficulty  turning over in bed (including adjusting bedclothes, sheets and blankets)? 1  Difficulty moving from lying on back to sitting on the side of the bed?  1  Difficulty sitting down on and standing up from a chair with arms (e.g., wheelchair, bedside commode, etc,.)? 1  Help needed moving to and from a bed to chair (including a wheelchair)? 3  Help needed walking in hospital room? 3  Help needed climbing 3-5 steps with a railing?  2  6 Click Score 11  Mobility G Code  CL  PT Goal Progression  Progress towards PT goals Progressing toward goals  Acute Rehab PT Goals  PT Goal Formulation With patient  Time For Goal Achievement 11/30/17  Potential to Achieve Goals Good  PT Time Calculation  PT Start Time (ACUTE ONLY) 1502  PT Stop Time (ACUTE ONLY) 1514  PT Time Calculation (min) (ACUTE ONLY) 12 min  PT General Charges  $$ ACUTE PT VISIT 1 Visit  PT Treatments  $Therapeutic Exercise 8-22 mins

## 2017-11-23 NOTE — Evaluation (Signed)
Physical Therapy Evaluation Patient Details Name: Jerry [name] MRN: [id number] DOB: 01/12/1951 Today's Date: 11/23/2017   History of Present Illness  67 yo male adm 11/22/17 for L TKA; PMHx: CHF, HTN, CAD, chronic back pain, bil TKA, back surgery  Clinical Impression  Pt is s/p TKA resulting in the deficits listed below (see PT Problem List). Pt amb with cane at baseline, reports he doesn't know what his plan is for therapy post acute, recommend HHPT or PT per surgeon; limited gait distance today d/t pain and fatigue; Pt will benefit from skilled PT to increase their independence and safety with mobility to allow discharge to the venue listed below.      Follow Up Recommendations Follow surgeon's recommendation for DC plan and follow-up therapies    Equipment Recommendations  Rolling walker with 5" wheels(pt has std walker, may decline RW ?)    Recommendations for Other Services       Precautions / Restrictions Precautions Precautions: Fall;Knee Required Braces or Orthoses: Knee Immobilizer - Left Restrictions Weight Bearing Restrictions: No      Mobility  Bed Mobility Overal bed mobility: Needs Assistance Bed Mobility: Supine to Sit     Supine to sit: Min assist     General bed mobility comments: assist with LLE, incr time  Transfers Overall transfer level: Needs assistance Equipment used: Rolling walker (2 wheeled) Transfers: Sit to/from Stand Sit to Stand: Min assist;From elevated surface         General transfer comment: cues for hand placement and LLE position  Ambulation/Gait Ambulation/Gait assistance: Min assist Ambulation Distance (Feet): 14 Feet Assistive device: Rolling walker (2 wheeled) Gait Pattern/deviations: Step-to pattern;Antalgic;Decreased weight shift to left;Trunk flexed     General Gait Details: cues for sequence, RW position and turnk extension  Science writer    Modified Rankin (Stroke Patients  Only)       Balance Overall balance assessment: Needs assistance         Standing balance support: During functional activity;Bilateral upper extremity supported Standing balance-Leahy Scale: Poor Standing balance comment: reliant on UEs                             Pertinent Vitals/Pain Pain Assessment: 0-10 Pain Score: 2  Pain Location: left knee Pain Descriptors / Indicators: Sore Pain Intervention(s): Limited activity within patient's tolerance;Monitored during session    Home Living Family/patient expects to be discharged to:: Private residence Living Arrangements: Children(son and fiance) Available Help at Discharge: Family;Available PRN/intermittently Type of Home: House       Home Layout: One level Home Equipment: Walker - standard;Hand held shower head;Grab bars - tub/shower;Bedside commode;Tub bench;Cane - quad Additional Comments: pt lives with his son and son's fiance    Prior Function           Comments: reports using QC for ambulation PTA.  Sometimes he will use the walker if he feels bad.      Hand Dominance        Extremity/Trunk Assessment   Upper Extremity Assessment Upper Extremity Assessment: Overall WFL for tasks assessed    Lower Extremity Assessment Lower Extremity Assessment: LLE deficits/detail LLE Deficits / Details: ankle WFL; knee extension & hip flexion 2+/5 limited by pain       Communication   Communication: No difficulties  Cognition Arousal/Alertness: Awake/alert Behavior During Therapy: WFL for tasks assessed/performed Overall Cognitive Status:  Within Functional Limits for tasks assessed                                        General Comments      Exercises     Assessment/Plan    PT Assessment Patient needs continued PT services  PT Problem List Decreased strength;Decreased range of motion;Decreased mobility;Decreased activity tolerance;Decreased balance;Decreased knowledge of use  of DME;Pain       PT Treatment Interventions DME instruction;Gait training;Functional mobility training;Therapeutic activities;Therapeutic exercise;Patient/family education    PT Goals (Current goals can be found in the Care Plan section)  Acute Rehab PT Goals Patient Stated Goal: less knee pain PT Goal Formulation: With patient Time For Goal Achievement: 11/30/17 Potential to Achieve Goals: Good    Frequency 7X/week   Barriers to discharge        Co-evaluation               AM-PAC PT "6 Clicks" Daily Activity  Outcome Measure Difficulty turning over in bed (including adjusting bedclothes, sheets and blankets)?: Unable Difficulty moving from lying on back to sitting on the side of the bed? : Unable Difficulty sitting down on and standing up from a chair with arms (e.g., wheelchair, bedside commode, etc,.)?: Unable Help needed moving to and from a bed to chair (including a wheelchair)?: A Little Help needed walking in hospital room?: A Little Help needed climbing 3-5 steps with a railing? : A Lot 6 Click Score: 11    End of Session Equipment Utilized During Treatment: Gait belt;Left knee immobilizer Activity Tolerance: Patient tolerated treatment well Patient left: in chair;with call bell/phone within reach;with chair alarm set   PT Visit Diagnosis: Difficulty in walking, not elsewhere classified (R26.2)    Time: 1141-1200 PT Time Calculation (min) (ACUTE ONLY): 19 min   Charges:   PT Evaluation $PT Eval Low Complexity: 1 Low     PT G CodesKenyon Ana, PT Pager: [phone] 11/23/2017   [facility] 11/23/2017, 12:34 PM

## 2017-11-24 LAB — CBC
HCT: 30.6 % — ABNORMAL LOW (ref 39.0–52.0)
Hemoglobin: 9.9 g/dL — ABNORMAL LOW (ref 13.0–17.0)
MCH: 29.9 pg (ref 26.0–34.0)
MCHC: 32.4 g/dL (ref 30.0–36.0)
MCV: 92.4 fL (ref 78.0–100.0)
PLATELETS: 204 10*3/uL (ref 150–400)
RBC: 3.31 MIL/uL — AB (ref 4.22–5.81)
RDW: 12.8 % (ref 11.5–15.5)
WBC: 14.3 10*3/uL — ABNORMAL HIGH (ref 4.0–10.5)

## 2017-11-24 MED ORDER — ACETAMINOPHEN 325 MG PO TABS
650.0000 mg | ORAL_TABLET | Freq: Four times a day (QID) | ORAL | Status: DC
  Administered 2017-11-24 – 2017-11-27 (×13): 650 mg via ORAL
  Filled 2017-11-24 (×13): qty 2

## 2017-11-24 MED ORDER — OXYCODONE HCL 5 MG PO TABS
25.0000 mg | ORAL_TABLET | Freq: Four times a day (QID) | ORAL | Status: DC | PRN
  Administered 2017-11-24 (×2): 25 mg via ORAL
  Administered 2017-11-25 (×2): 30 mg via ORAL
  Administered 2017-11-25: 25 mg via ORAL
  Administered 2017-11-26 – 2017-11-27 (×7): 30 mg via ORAL
  Filled 2017-11-24 (×2): qty 6
  Filled 2017-11-24: qty 5
  Filled 2017-11-24 (×2): qty 6
  Filled 2017-11-24: qty 5
  Filled 2017-11-24 (×3): qty 6
  Filled 2017-11-24: qty 5
  Filled 2017-11-24 (×3): qty 6

## 2017-11-24 NOTE — Progress Notes (Addendum)
Physical Therapy Treatment Patient Details Name: Jerry [name] MRN: [id number] DOB: May 17, 1951 Today's Date: 11/24/2017    History of Present Illness 67 yo male adm 11/22/17 for L TKA; PMHx: CHF, HTN, CAD, chronic back pain, bil TKA, back surgery    PT Comments    Pt  Progressing nicely; still with some pain issues but motivated to work with PT; D/C tomorrow or Tuesday per pt--will need to do stairs prior to D/C; ambulated max of 45' this am and 35' this pm; will continue to follow  Follow Up Recommendations  Follow surgeon's recommendation for DC plan and follow-up therapies(?HHPT)     Equipment Recommendations  Rolling walker with 5" wheels    Recommendations for Other Services       Precautions / Restrictions Precautions Precautions: Fall;Knee Required Braces or Orthoses: Knee Immobilizer - Left Restrictions Weight Bearing Restrictions: No Other Position/Activity Restrictions: WBAT    Mobility  Bed Mobility Overal bed mobility: Needs Assistance Bed Mobility: Supine to Sit;Sit to Supine     Supine to sit: Min assist;Min guard Sit to supine: Min assist;Min guard   General bed mobility comments: light min assist to min/guard with LLE  Transfers Overall transfer level: Needs assistance Equipment used: Rolling walker (2 wheeled) Transfers: Sit to/from Stand Sit to Stand: Min guard         General transfer comment: cues for hand placement and LLE position  Ambulation/Gait Ambulation/Gait assistance: Min guard Ambulation Distance (Feet): 35 Feet Assistive device: Rolling walker (2 wheeled) Gait Pattern/deviations: Step-to pattern;Antalgic;Decreased weight shift to left;Trunk flexed Gait velocity: decr   General Gait Details: cues for sequence, RW position and trunk extension   Stairs             Wheelchair Mobility    Modified Rankin (Stroke Patients Only)       Balance           Standing balance support: During functional  activity;Bilateral upper extremity supported Standing balance-Leahy Scale: Poor Standing balance comment: reliant on at least unilateral UE support for static stand                            Cognition Arousal/Alertness: Awake/alert Behavior During Therapy: WFL for tasks assessed/performed Overall Cognitive Status: Within Functional Limits for tasks assessed                                        Exercises Total Joint Exercises Ankle Circles/Pumps: AROM;Both;10 reps Quad Sets: AROM;Both;15 reps Heel Slides: AAROM;Left;10 reps Hip ABduction/ADduction: AROM;Left;10 reps Straight Leg Raises: AAROM;Left;10 reps Goniometric ROM: grossly 8* to 55* AAROM L knee flexion    General Comments        Pertinent Vitals/Pain Pain Location: left knee Pain Descriptors / Indicators: Sore 6/10, RN notified    Home Living                      Prior Function            PT Goals (current goals can now be found in the care plan section) Acute Rehab PT Goals Patient Stated Goal: less knee pain PT Goal Formulation: With patient Time For Goal Achievement: 11/30/17 Potential to Achieve Goals: Good    Frequency    7X/week      PT Plan Current plan remains appropriate    Co-evaluation  AM-PAC PT "6 Clicks" Daily Activity  Outcome Measure  Difficulty turning over in bed (including adjusting bedclothes, sheets and blankets)?: Unable Difficulty moving from lying on back to sitting on the side of the bed? : Unable Difficulty sitting down on and standing up from a chair with arms (e.g., wheelchair, bedside commode, etc,.)?: Unable Help needed moving to and from a bed to chair (including a wheelchair)?: A Little Help needed walking in hospital room?: A Little Help needed climbing 3-5 steps with a railing? : A Lot 6 Click Score: 11    End of Session Equipment Utilized During Treatment: Gait belt;Left knee immobilizer Activity  Tolerance: Patient tolerated treatment well Patient left: Other (comment);with call bell/phone within reach;with nursing/sitter in room, in chair with LR elevated   PT Visit Diagnosis: Difficulty in walking, not elsewhere classified (R26.2)     Time: 8280-0349 PT Time Calculation (min) (ACUTE ONLY): 18 min  Charges:  $Therapeutic Exercise: 8-22 mins                    G CodesKenyon Ana, PT Pager: [phone] 11/24/2017    Kenyon Ana 11/24/2017, 4:25 PM

## 2017-11-24 NOTE — Progress Notes (Signed)
Physical Therapy Treatment Patient Details Name: Jerry [name] MRN: [id number] DOB: 04-20-1951 Today's Date: 11/24/2017    History of Present Illness 67 yo male adm 11/22/17 for L TKA; PMHx: CHF, HTN, CAD, chronic back pain, bil TKA, back surgery    PT Comments    Pt doing well,  Still having some issues with pain and edema LLE; will see again in pm  Follow Up Recommendations  Follow surgeon's recommendation for DC plan and follow-up therapies(?HHPT)     Equipment Recommendations  Rolling walker with 5" wheels    Recommendations for Other Services       Precautions / Restrictions Precautions Precautions: Fall;Knee Required Braces or Orthoses: Knee Immobilizer - Left Restrictions Weight Bearing Restrictions: No Other Position/Activity Restrictions: WBAT    Mobility  Bed Mobility Overal bed mobility: Needs Assistance Bed Mobility: Supine to Sit     Supine to sit: Min assist     General bed mobility comments: light min assist with LLE  Transfers Overall transfer level: Needs assistance Equipment used: Rolling walker (2 wheeled) Transfers: Sit to/from Stand Sit to Stand: Min guard         General transfer comment: cues for hand placement and LLE position  Ambulation/Gait Ambulation/Gait assistance: Min guard Ambulation Distance (Feet): 45 Feet Assistive device: Rolling walker (2 wheeled) Gait Pattern/deviations: Step-to pattern;Antalgic;Decreased weight shift to left;Trunk flexed Gait velocity: decr   General Gait Details: cues for sequence, RW position and trunk extension   Stairs             Wheelchair Mobility    Modified Rankin (Stroke Patients Only)       Balance                                            Cognition Arousal/Alertness: Awake/alert Behavior During Therapy: WFL for tasks assessed/performed Overall Cognitive Status: Within Functional Limits for tasks assessed                                         Exercises Total Joint Exercises Ankle Circles/Pumps: AROM;Both;10 reps Quad Sets: AROM;Both;15 reps    General Comments        Pertinent Vitals/Pain Pain Assessment: 0-10 Pain Score: 4  Pain Location: left knee Pain Descriptors / Indicators: Sore Pain Intervention(s): Monitored during session;Premedicated before session    Home Living                      Prior Function            PT Goals (current goals can now be found in the care plan section) Acute Rehab PT Goals Patient Stated Goal: less knee pain PT Goal Formulation: With patient Time For Goal Achievement: 11/30/17 Potential to Achieve Goals: Good Progress towards PT goals: Progressing toward goals    Frequency    7X/week      PT Plan Current plan remains appropriate    Co-evaluation              AM-PAC PT "6 Clicks" Daily Activity  Outcome Measure  Difficulty turning over in bed (including adjusting bedclothes, sheets and blankets)?: Unable Difficulty moving from lying on back to sitting on the side of the bed? : Unable Difficulty sitting down on and standing up from  a chair with arms (e.g., wheelchair, bedside commode, etc,.)?: Unable Help needed moving to and from a bed to chair (including a wheelchair)?: A Little Help needed walking in hospital room?: A Little Help needed climbing 3-5 steps with a railing? : A Lot 6 Click Score: 11    End of Session Equipment Utilized During Treatment: Gait belt;Left knee immobilizer Activity Tolerance: Patient tolerated treatment well Patient left: Other (comment);with call bell/phone within reach;with nursing/sitter in room(EOB with NT )   PT Visit Diagnosis: Difficulty in walking, not elsewhere classified (R26.2)     Time: 3606-7703 PT Time Calculation (min) (ACUTE ONLY): 21 min  Charges:  $Gait Training: 8-22 mins                    G CodesKenyon Ana, PT Pager:  [phone] 11/24/2017    Kenyon Ana 11/24/2017, 11:12 AM

## 2017-11-24 NOTE — Progress Notes (Addendum)
  PATIENT ID: Jerry [name]  MRN: [id number]  DOB/AGE:  [birth year] / 67 y.o.  2 Days Post-Op Procedure(s) (LRB): LEFT TOTAL KNEE ARTHROPLASTY (Left)  Subjective: Pain OK with baseline oxycodone and Morphine added-in.  No c/o chest pain or SOB. C/o left foot swelling Tol PO, reports no flatus yet  Has chronic back pain, with implanted spinal morphine device and baseline oxycodone 20mg   3-4 times daily  Objective: Vital signs in last 24 hours: Temp:  [98.2 F (36.8 C)-98.4 F (36.9 C)] 98.2 F (36.8 C) (04/21 0533) Pulse Rate:  [69-86] 69 (04/21 0533) Resp:  [14-18] 14 (04/21 0533) BP: (136-160)/(67-76) 136/67 (04/21 0533) SpO2:  [94 %-97 %] 94 % (04/21 0533)  Intake/Output from previous day: 04/20 0701 - 04/21 0700 In: 843.3 [P.O.:360; I.V.:483.3] Out: 2585 [Urine:2585] Intake/Output this shift: Total I/O In: 240 [P.O.:240] Out: 200 [Urine:200]  Recent Labs    11/23/17 0509 11/24/17 0437  HGB 10.8* 9.9*   Recent Labs    11/23/17 0509 11/24/17 0437  WBC 12.9* 14.3*  RBC 3.63* 3.31*  HCT 34.0* 30.6*  PLT 216 204   Recent Labs    11/23/17 0509  NA 139  K 4.3  CL 101  CO2 28  BUN 8  CREATININE 0.93  GLUCOSE 144*  CALCIUM 8.8*   No results for input(s): LABPT, INR in the last 72 hours.  Physical Exam: Sensation intact distally Intact pulses distally Dorsiflexion/Plantar flexion intact Incision: dressing C/D/I  Foot/ankle swollen, but calf soft and nontender  Assessment/Plan: 2 Days Post-Op Procedure(s) (LRB): LEFT TOTAL KNEE ARTHROPLASTY (Left)   L LE ACE removed and re-wrapped more loosely to prevent "collar" effect that could impede lymphatic drainage Weight Bearing as Tolerated (WBAT)  VTE prophylaxis: SCDs & ASA Will change oxycodone to 25-30 mg q 6, make tylenol a standing order rather than PRN, and plan d/c home when clears PT  Jerry [name], [place], [place]  [address] Office: [phone] Mobile: [phone]  11/24/2017, 11:07 AM

## 2017-11-25 LAB — CBC
HCT: 28.1 % — ABNORMAL LOW (ref 39.0–52.0)
Hemoglobin: 9.1 g/dL — ABNORMAL LOW (ref 13.0–17.0)
MCH: 30.2 pg (ref 26.0–34.0)
MCHC: 32.4 g/dL (ref 30.0–36.0)
MCV: 93.4 fL (ref 78.0–100.0)
PLATELETS: 159 10*3/uL (ref 150–400)
RBC: 3.01 MIL/uL — ABNORMAL LOW (ref 4.22–5.81)
RDW: 12.9 % (ref 11.5–15.5)
WBC: 8.6 10*3/uL (ref 4.0–10.5)

## 2017-11-25 NOTE — Progress Notes (Signed)
Subjective: 3 Days Post-Op Procedure(s) (LRB): LEFT TOTAL KNEE ARTHROPLASTY (Left) Patient reports pain as moderate.    Objective: Vital signs in last 24 hours: Temp:  [98.2 F (36.8 C)-98.7 F (37.1 C)] 98.7 F (37.1 C) (04/22 0515) Pulse Rate:  [66-80] 66 (04/22 0515) Resp:  [16-18] 16 (04/22 0515) BP: (124-153)/(63-66) 124/64 (04/22 0515) SpO2:  [90 %-97 %] 93 % (04/22 0515)  Intake/Output from previous day: 04/21 0701 - 04/22 0700 In: 1080 [P.O.:1080] Out: 2700 [Urine:2700] Intake/Output this shift: No intake/output data recorded.  Recent Labs    11/23/17 0509 11/24/17 0437 11/25/17 0439  HGB 10.8* 9.9* 9.1*   Recent Labs    11/24/17 0437 11/25/17 0439  WBC 14.3* 8.6  RBC 3.31* 3.01*  HCT 30.6* 28.1*  PLT 204 159   Recent Labs    11/23/17 0509  NA 139  K 4.3  CL 101  CO2 28  BUN 8  CREATININE 0.93  GLUCOSE 144*  CALCIUM 8.8*   No results for input(s): LABPT, INR in the last 72 hours.  Neurologically intact ABD soft Neurovascular intact Sensation intact distally No cellulitis present Compartment soft  Patient can do straight leg raise and is bending to about 40 degrees actively. Anticipated LOS equal to or greater than 2 midnights due to - Age 67 and older with one or more of the following:  - Obesity  - Expected need for hospital services (PT, OT, Nursing) required for safe  discharge  - Anticipated need for postoperative skilled nursing care or inpatient rehab  - Active co-morbidities: Chronic pain requiring opiods, Anemia and Patient has difficulty with ambulation and stair climbing and certainly is not reasonable for discharge home. OR   - Unanticipated findings during/Post Surgery: None  - Patient is a high risk of re-admission due to: Difficulty meeting goals for discharge home   Assessment/Plan: 3 Days Post-Op Procedure(s) (LRB): LEFT TOTAL KNEE ARTHROPLASTY (Left) Advance diet Up with therapy  Hopefully patient will be discharged  home today but does need to meet certain goals of distance walking and stair climbing to be safe at home.    Alta Corning 11/25/2017, 7:31 AM

## 2017-11-25 NOTE — Op Note (Signed)
NAMEEMMITT, MATTHEWS               ACCOUNT NO.:  [id number]  MEDICAL RECORD NO.:  [id number]  LOCATION:                                 FACILITY:  PHYSICIAN:  Alta Corning, M.D.   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  PROCEDURE:  Left total knee replacement with Attune system size 9 femur, size 10 tibia, 5-mm bridging bearing, and a 41-mm all-polyethylene patella.  SURGEON:  Alta Corning, M.D.  Terrence DupontModena Slater.  ANESTHESIA:  General.  BRIEF HISTORY:  Mr. Schewe is a 67 year old male with long history and significant complaints of left knee pain.  He had been treated conservatively for prolonged period of time.  After failure of conservative care, he was taken to the operating room for left total knee replacement.  The patient has had previous right total knee replacement and done well with that.  DESCRIPTION OF PROCEDURE:  The patient was brought to the operating room and after adequate anesthesia was obtained with general anesthetic, the patient was placed supine on the operating table.  The left leg was prepped and draped in usual sterile fashion.  Following this, the leg was exsanguinated.  Blood pressure tourniquet was inflated 350 mmHg. Following this, a midline incision was made.  Subcutaneous tissues were taken down to the level of extensor mechanism.  A medial parapatellar arthrotomy was undertaken.  At this point, it is clear that the patella has dramatic and severe osteophytosis.  We did release of the osteophytes and then we allowed to evert the patella and then intramedullary pilot hole was drilled and 9 mm of distal bone was resected with a 4-degree valgus  inclination.  Once this was completed, attention was turned towards the sizing the femur, sized it to a 9. Anterior and posterior cuts were made, chamfers and box.  Attention was turned to the tibia.  It was cut perpendicular to the long axis and then it was drilled and keeled for a 10.  A 5 spacer block could be put in place prior to excepting this.  Once this was done, attention was turned towards the patella.  Over 9 mm of bone was resected from the patella and then a 41.  Trial was placed and it was drilled and trial patella was put in place.  Knee was put through a range of motion.  Excellent stability and range of motion were achieved at this point.  The trial components were then removed.  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  At this point, the knee was copiously and thoroughly irrigated with pulsatile lavage and suctioned dry.  The final components were then cemented and placed size 9 femur, size 10 tibia, 5-mm bridging bearing trial was placed, 41 patella.  All- poly patella was placed and held with a clamp.  Once this was  completed, the knee was held in extension while the cement allowed to harden.  We allowed it to completely harden because of his osteoporosis.  We then took Exparel and injected throughout the synovial reflection for postoperative pain control.  Once this was all completed, I had the tourniquet let down.  All bleeding was controlled with electrocautery. The final poly was then placed with a size 5 and the knee was put through a range of motion.  Excellent stability and range of motion were achieved.  A medial parapatellar arthrotomy was closed with 1 Vicryl running, the skin with 0 and 2-0 Vicryl, and skin staples because of the poor tissue quality.  Sterile compressive dressing was applied and the patient was taken to Recovery where he was noted to  be in satisfactory condition.  Estimated blood loss for procedure was minimal.     Alta Corning, M.D.     Corliss Skains  D:  11/22/2017  T:  11/23/2017  Job:  [id number]

## 2017-11-25 NOTE — Plan of Care (Signed)
Plan of care discussed.   

## 2017-11-25 NOTE — Progress Notes (Signed)
Physical Therapy Treatment Patient Details Name: Jerry [name] MRN: [id number] DOB: 1950/11/05 Today's Date: 11/25/2017    History of Present Illness 67 yo male adm 11/22/17 for L TKA; PMHx: CHF, HTN, CAD, chronic back pain, bil TKA, back surgery    PT Comments    The patient  Did ambulate x 60' this visit. Very fatiged. Plan practice steps in AM- has 5. 2 were very difficult this AM.    Follow Up Recommendations  Follow surgeon's recommendation for DC plan and follow-up therapies     Equipment Recommendations  Rolling walker with 5" wheels    Recommendations for Other Services       Precautions / Restrictions Precautions Precautions: Fall;Knee Required Braces or Orthoses: Knee Immobilizer - Left    Mobility  Bed Mobility Overal bed mobility: Needs Assistance Bed Mobility: Supine to Sit     Supine to sit: Min guard;HOB elevated Sit to supine: Min assist;Min guard   General bed mobility comments: HOB elevated; MinGuard for safety;   Transfers Overall transfer level: Needs assistance Equipment used: Rolling walker (2 wheeled) Transfers: Sit to/from Stand Sit to Stand: Min guard;From elevated surface         General transfer comment: close minguard for safety; slightly elevated EOB; increased time and effort   Ambulation/Gait Ambulation/Gait assistance: Min assist;+2 safety/equipment Ambulation Distance (Feet): 60 Feet Assistive device: Rolling walker (2 wheeled) Gait Pattern/deviations: Step-to pattern Gait velocity: decr   General Gait Details: decreased endurance but did  ambulate farther,   Stairs Stairs: Yes Stairs assistance: Mod assist Stair Management: Two rails Number of Stairs: 2 General stair comments: much difficulty WB in  keft leg ,not able to parctice more than 2/   Wheelchair Mobility    Modified Rankin (Stroke Patients Only)       Balance                                            Cognition  Arousal/Alertness: Awake/alert                                            Exercises Total Joint Exercises Ankle Circles/Pumps: AROM;Both;10 reps Quad Sets: AROM;Both;15 reps Towel Squeeze: AROM;Both;10 reps Heel Slides: AAROM;Left;10 reps Hip ABduction/ADduction: AROM;Left;10 reps Straight Leg Raises: AAROM;Left;10 reps Goniometric ROM: 10-50 left knee flexion    General Comments        Pertinent Vitals/Pain Pain Score: 4  Pain Location: left knee, with WB Pain Descriptors / Indicators: Sore Pain Intervention(s): Monitored during session;Premedicated before session    Home Living                      Prior Function            PT Goals (current goals can now be found in the care plan section) Progress towards PT goals: Progressing toward goals    Frequency    7X/week      PT Plan Current plan remains appropriate    Co-evaluation              AM-PAC PT "6 Clicks" Daily Activity  Outcome Measure  Difficulty turning over in bed (including adjusting bedclothes, sheets and blankets)?: Unable Difficulty moving from lying on back to sitting on the side  of the bed? : Unable Difficulty sitting down on and standing up from a chair with arms (e.g., wheelchair, bedside commode, etc,.)?: Unable Help needed moving to and from a bed to chair (including a wheelchair)?: A Lot Help needed walking in hospital room?: A Lot Help needed climbing 3-5 steps with a railing? : Total 6 Click Score: 8    End of Session Equipment Utilized During Treatment: Gait belt;Left knee immobilizer Activity Tolerance: Patient limited by fatigue;Patient limited by pain Patient left: in chair;with call bell/phone within reach Nurse Communication: Mobility status PT Visit Diagnosis: Difficulty in walking, not elsewhere classified (R26.2)     Time: 0370-4888 PT Time Calculation (min) (ACUTE ONLY): 13 min  Charges:  $Gait Training: 8-22 mins $Therapeutic  Exercise: 23-37 mins                    G CodesTresa Endo PT [phone]    Claretha Cooper 11/25/2017, 3:22 PM

## 2017-11-25 NOTE — Progress Notes (Signed)
Physical Therapy Treatment Patient Details Name: Jerry [name] MRN: [id number] DOB: 1951/03/05 Today's Date: 11/25/2017    History of Present Illness 67 yo male adm 11/22/17 for L TKA; PMHx: CHF, HTN, CAD, chronic back pain, bil TKA, back surgery    PT Comments    The patient had much difficulty with negotiating 2 steps. Patient has 5 and is not able to tolerate practicing more than 2 today. RN and MD aware of hardship. Continue practicing steps next visdit.   Follow Up Recommendations  Follow surgeon's recommendation for DC plan and follow-up therapies     Equipment Recommendations  Rolling walker with 5" wheels    Recommendations for Other Services       Precautions / Restrictions Precautions Precautions: Fall;Knee Required Braces or Orthoses: Knee Immobilizer - Left Restrictions Weight Bearing Restrictions: No Other Position/Activity Restrictions: WBAT    Mobility  Bed Mobility Overal bed mobility: Needs Assistance Bed Mobility: Supine to Sit     Supine to sit: Min guard;HOB elevated Sit to supine: Min assist;Min guard   General bed mobility comments: HOB elevated; MinGuard for safety; assist left leg onto bed  Transfers Overall transfer level: Needs assistance Equipment used: Rolling walker (2 wheeled) Transfers: Sit to/from Stand Sit to Stand: Min guard;From elevated surface         General transfer comment: close minguard for safety; slightly elevated EOB; increased time and effort   Ambulation/Gait Ambulation/Gait assistance: Min guard Ambulation Distance (Feet): 20 Feet Assistive device: Rolling walker (2 wheeled) Gait Pattern/deviations: Step-to pattern Gait velocity: decr   General Gait Details: cues for sequence, RW position and trunk extensionm, after practicing 2 steps, required recliner be brought up.   Stairs Stairs: Yes Stairs assistance: Mod assist Stair Management: Two rails Number of Stairs: 2 General stair comments: much  difficulty WB in  keft leg ,not able to parctice more than 2/   Wheelchair Mobility    Modified Rankin (Stroke Patients Only)       Balance Overall balance assessment: Needs assistance         Standing balance support: During functional activity;Bilateral upper extremity supported Standing balance-Leahy Scale: Poor Standing balance comment: reliant on at least unilateral UE support for static stand                            Cognition Arousal/Alertness: Awake/alert Behavior During Therapy: WFL for tasks assessed/performed Overall Cognitive Status: Within Functional Limits for tasks assessed                                        Exercises Total Joint Exercises Ankle Circles/Pumps: AROM;Both;10 reps Quad Sets: AROM;Both;15 reps Towel Squeeze: AROM;Both;10 reps Heel Slides: AAROM;Left;10 reps Hip ABduction/ADduction: AROM;Left;10 reps Straight Leg Raises: AAROM;Left;10 reps Goniometric ROM: 10-50 left knee flexion    General Comments        Pertinent Vitals/Pain Pain Assessment: Faces Pain Score: 4  Faces Pain Scale: Hurts little more Pain Location: left knee Pain Descriptors / Indicators: Sore Pain Intervention(s): Monitored during session;Premedicated before session;Ice applied    Home Living Family/patient expects to be discharged to:: Private residence Living Arrangements: Children(sone and son's fiance) Available Help at Discharge: Family;Available PRN/intermittently Type of Home: House Home Access: Stairs to enter   Home Layout: One level Home Equipment: Walker - standard;Hand held shower head;Grab bars - tub/shower;Bedside commode;Cane - quad  Prior Function        Comments: reports using QC for ambulation PTA.  Sometimes he will use the walker if he feels bad; reports sone was assisting with transfers to/from bathtub    PT Goals (current goals can now be found in the care plan section) Acute Rehab PT Goals Patient  Stated Goal: less knee pain; gain more mobility to go home Progress towards PT goals: Progressing toward goals    Frequency    7X/week      PT Plan Current plan remains appropriate    Co-evaluation              AM-PAC PT "6 Clicks" Daily Activity  Outcome Measure  Difficulty turning over in bed (including adjusting bedclothes, sheets and blankets)?: Unable           6 Click Score: 1    End of Session Equipment Utilized During Treatment: Gait belt;Left knee immobilizer Activity Tolerance: Patient limited by fatigue;Patient limited by pain Patient left: in bed;with call bell/phone within reach Nurse Communication: Mobility status PT Visit Diagnosis: Difficulty in walking, not elsewhere classified (R26.2)     Time: 5956-3875 PT Time Calculation (min) (ACUTE ONLY): 55 min  Charges:  $Gait Training: 23-37 mins $Therapeutic Exercise: 23-37 mins                    G CodesTresa Endo PT [phone]  Claretha Cooper 11/25/2017, 1:11 PM

## 2017-11-25 NOTE — Evaluation (Addendum)
Occupational Therapy Evaluation Patient Details Name: Jerry [name] MRN: [id number] DOB: 05-Sep-1950 Today's Date: 11/25/2017    History of Present Illness 67 yo male adm 11/22/17 for L TKA; PMHx: CHF, HTN, CAD, chronic back pain, bil TKA, back surgery   Clinical Impression   This 67 y/o male presents with the above. At baseline pt is mod independent for functional mobility using quad cane, receives assist from son for tub transfers but was otherwise completing ADLs without assist. Pt completing room level functional mobility with MinA-MinGuard this session using RW; currently requires ModA for LB ADLs secondary to LLE functional limitations. Pt reports he will have available family assist at time of d/c for ADL assist PRN. Will benefit from continued OT services while in acute setting, and feel pt would benefit from additional [facility] services after discharge to maximize his overall safety and independence with ADLs and mobility.     Follow Up Recommendations  Follow surgeon's recommendation for DC plan and follow-up therapies;Supervision/Assistance - 24 hour(would benefit from [facility] )    Equipment Recommendations  None recommended by OT           Precautions / Restrictions Precautions Precautions: Fall;Knee Required Braces or Orthoses: Knee Immobilizer - Left Restrictions Weight Bearing Restrictions: No Other Position/Activity Restrictions: WBAT      Mobility Bed Mobility Overal bed mobility: Needs Assistance Bed Mobility: Supine to Sit     Supine to sit: Min guard;HOB elevated     General bed mobility comments: HOB elevated; MinGuard for safety; pt able to advance LLE over EOB and control descent to floor   Transfers Overall transfer level: Needs assistance Equipment used: Rolling walker (2 wheeled) Transfers: Sit to/from Stand Sit to Stand: Min guard;From elevated surface         General transfer comment: close minguard for safety; slightly elevated EOB; increased  time and effort     Balance Overall balance assessment: Needs assistance         Standing balance support: During functional activity;Bilateral upper extremity supported Standing balance-Leahy Scale: Poor Standing balance comment: reliant on at least unilateral UE support for static stand                           ADL either performed or assessed with clinical judgement   ADL Overall ADL's : Needs assistance/impaired Eating/Feeding: Modified independent;Sitting   Grooming: Wash/dry face;Min guard;Standing Grooming Details (indicate cue type and reason): pt using single UE support on sink during task completion; minguard for safety in standing  Upper Body Bathing: Min guard;Sitting   Lower Body Bathing: Moderate assistance;Sit to/from stand   Upper Body Dressing : Min guard;Sitting   Lower Body Dressing: Moderate assistance;Sit to/from stand   Toilet Transfer: Minimal assistance;Ambulation;BSC;RW Toilet Transfer Details (indicate cue type and reason): BSC over toilet; simulated in transfer to [place] and Hygiene: Minimal assistance;Sit to/from Nurse, children's Details (indicate cue type and reason): educated pt to initially wash up seated on BSC at sink until he is able to safely step into tub with assist from son; educated on use of BSC as shower seat and to complete task from sit<>stand level initially for increased safety during task  Functional mobility during ADLs: Min guard;Minimal assistance;Rolling walker General ADL Comments: educated on safety and compensatory techniques for completing ADLs and functional transfers  Pertinent Vitals/Pain Pain Assessment: Faces Faces Pain Scale: Hurts little more Pain Location: left knee Pain Descriptors / Indicators: Sore Pain Intervention(s): Monitored during session;Repositioned;Ice applied     Hand Dominance Right   Extremity/Trunk  Assessment Upper Extremity Assessment Upper Extremity Assessment: Overall WFL for tasks assessed   Lower Extremity Assessment Lower Extremity Assessment: Defer to PT evaluation       Communication Communication Communication: No difficulties   Cognition Arousal/Alertness: Awake/alert Behavior During Therapy: WFL for tasks assessed/performed Overall Cognitive Status: Within Functional Limits for tasks assessed                                                      Home Living Family/patient expects to be discharged to:: Private residence Living Arrangements: Children(sone and son's fiance) Available Help at Discharge: Family;Available PRN/intermittently Type of Home: House Home Access: Stairs to enter     Home Layout: One level     Bathroom Shower/Tub: Teacher, early years/pre: Handicapped height     Home Equipment: Environmental consultant - standard;Hand held shower head;Grab bars - tub/shower;Bedside commode;Cane - quad          Prior Functioning/Environment          Comments: reports using QC for ambulation PTA.  Sometimes he will use the walker if he feels bad; reports sone was assisting with transfers to/from bathtub         OT Problem List: Decreased strength;Impaired balance (sitting and/or standing);Pain;Decreased range of motion;Decreased knowledge of use of DME or AE;Decreased activity tolerance      OT Treatment/Interventions: Self-care/ADL training;DME and/or AE instruction;Balance training;Therapeutic exercise;Therapeutic activities;Patient/family education    OT Goals(Current goals can be found in the care plan section) Acute Rehab OT Goals Patient Stated Goal: less knee pain; gain more mobility to go home OT Goal Formulation: With patient Time For Goal Achievement: 12/09/17 Potential to Achieve Goals: Good  OT Frequency: Min 2X/week                             AM-PAC PT "6 Clicks" Daily Activity     Outcome Measure  Help from another person eating meals?: None Help from another person taking care of personal grooming?: A Little Help from another person toileting, which includes using toliet, bedpan, or urinal?: A Little Help from another person bathing (including washing, rinsing, drying)?: A Lot Help from another person to put on and taking off regular upper body clothing?: None Help from another person to put on and taking off regular lower body clothing?: A Lot 6 Click Score: 18   End of Session Equipment Utilized During Treatment: Gait belt;Rolling walker CPM Left Knee CPM Left Knee: Off Nurse Communication: Mobility status  Activity Tolerance: Patient tolerated treatment well Patient left: in chair;with call bell/phone within reach  OT Visit Diagnosis: Other abnormalities of gait and mobility (R26.89);Unsteadiness on feet (R26.81)                Time: 4696-2952 OT Time Calculation (min): 23 min Charges:  OT General Charges $OT Visit: 1 Visit OT Evaluation $OT Eval Low Complexity: 1 Low G-Codes:     Lou Cal, OT Pager [phone] 11/25/2017   Raymondo Band 11/25/2017, 9:44 AM

## 2017-11-25 NOTE — Progress Notes (Signed)
Discharge planning, spoke with patient at beside. Chose [facility] for [facility] services, PT to eval and treat. Contacted [facility] for referral. Needs a RW, contacted [facility] to deliver to room. [phone]

## 2017-11-26 MED ORDER — BISACODYL 10 MG RE SUPP
10.0000 mg | Freq: Once | RECTAL | Status: AC
  Administered 2017-11-26: 10 mg via RECTAL
  Filled 2017-11-26: qty 1

## 2017-11-26 NOTE — Plan of Care (Signed)
Pt alert and oriented, complaints of pain w movement.  PT to work with pt this am.  RN will monitor. Plan of care discussed.

## 2017-11-26 NOTE — Care Management Important Message (Signed)
Important Message  Patient Details  Name: ROMELLE REILEY MRN: [id number] Date of Birth: 24-Nov-1950   Medicare Important Message Given:  Yes    Kerin Salen 11/26/2017, 10:59 AMImportant Message  Patient Details  Name: HARUN BRUMLEY MRN: [id number] Date of Birth: 02/18/1951   Medicare Important Message Given:  Yes    Kerin Salen 11/26/2017, 10:59 AM

## 2017-11-26 NOTE — Progress Notes (Signed)
Physical Therapy Treatment Patient Details Name: Jerry [name] MRN: [id number] DOB: [birth year] Today's Date: 11/26/2017    History of Present Illness 67 yo male adm 11/22/17 for L TKA; PMHx: CHF, HTN, CAD, chronic back pain, bil TKA, back surgery    PT Comments    inmproving with gait and stairs. Plan PT in AM and DC to home.    Follow Up Recommendations  Follow surgeon's recommendation for DC plan and follow-up therapies     Equipment Recommendations  Rolling walker with 5" wheels    Recommendations for Other Services       Precautions / Restrictions Precautions Precautions: Fall;Knee Required Braces or Orthoses: Knee Immobilizer - Left    Mobility  Bed Mobility   Bed Mobility: Sit to Supine       Sit to supine: Supervision   General bed mobility comments: used lifter to self assist left leg  Transfers Overall transfer level: Needs assistance Equipment used: Rolling walker (2 wheeled) Transfers: Sit to/from Stand Sit to Stand: Supervision;Min guard         General transfer comment: min guard for sitting to ensure that chair did not move  Ambulation/Gait Ambulation/Gait assistance: Min assist;+2 safety/equipment Ambulation Distance (Feet): 60 Feet Assistive device: Rolling walker (2 wheeled) Gait Pattern/deviations: Step-to pattern;Step-through pattern Gait velocity: decr   General Gait Details: decreased endurance but did  ambulate farther,   Stairs Stairs: Yes Stairs assistance: Mod assist;+2 safety/equipment Stair Management: One rail Right;With crutches Number of Stairs: 4 General stair comments: improved with 1 crutch. Fatigued and had to sit down in recliner.   Wheelchair Mobility    Modified Rankin (Stroke Patients Only)       Balance                                            Cognition Arousal/Alertness: Awake/alert Behavior During Therapy: WFL for tasks assessed/performed                                           Exercises T  General Comments        Pertinent Vitals/Pain Pain Score: 6  Pain Location: left knee, with WB Pain Descriptors / Indicators: Sore Pain Intervention(s): Monitored during session;Premedicated before session    Home Living                      Prior Function            PT Goals (current goals can now be found in the care plan section) Progress towards PT goals: Progressing toward goals    Frequency    7X/week      PT Plan Current plan remains appropriate    Co-evaluation              AM-PAC PT "6 Clicks" Daily Activity  Outcome Measure  Difficulty turning over in bed (including adjusting bedclothes, sheets and blankets)?: A Little Difficulty moving from lying on back to sitting on the side of the bed? : A Little Difficulty sitting down on and standing up from a chair with arms (e.g., wheelchair, bedside commode, etc,.)?: A Lot Help needed moving to and from a bed to chair (including a wheelchair)?: A Lot Help needed walking in hospital room?: A Lot Help  needed climbing 3-5 steps with a railing? : A Lot 6 Click Score: 14    End of Session Equipment Utilized During Treatment: Gait belt Activity Tolerance: Patient tolerated treatment well Patient left: with call bell/phone within reach;in bed Nurse Communication: Mobility status PT Visit Diagnosis: Difficulty in walking, not elsewhere classified (R26.2)     Time: 0315-9458 PT Time Calculation (min) (ACUTE ONLY): 24 min  Charges:  $Gait Training: 23-37 mins $Therapeutic Exercise: 8-22 mins                    G Codes:          Claretha Cooper 11/26/2017, 5:56 PM

## 2017-11-26 NOTE — Progress Notes (Signed)
Occupational Therapy Treatment Patient Details Name: Jerry [name] MRN: [id number] DOB: 10/28/1950 Today's Date: 11/26/2017    History of present illness 67 yo male adm 11/22/17 for L TKA; PMHx: CHF, HTN, CAD, chronic back pain, bil TKA, back surgery   OT comments  Pt plans d/c later today.  Family will assist with adls.  Stood for grooming and educated on Transport planner. He will wash at sink initially at home.    Follow Up Recommendations  Follow surgeon's recommendation for DC plan and follow-up therapies;Supervision/Assistance - 24 hour    Equipment Recommendations  None recommended by OT    Recommendations for Other Services      Precautions / Restrictions Precautions Precautions: Fall;Knee Required Braces or Orthoses: Knee Immobilizer - Left Restrictions Weight Bearing Restrictions: No Other Position/Activity Restrictions: WBAT       Mobility Bed Mobility         Supine to sit: Supervision     General bed mobility comments: HOB raised  Transfers   Equipment used: Rolling walker (2 wheeled)   Sit to Stand: Supervision;Min guard         General transfer comment: min guard for sitting to ensure that chair did not move    Balance                                           ADL either performed or assessed with clinical judgement   ADL       Grooming: Oral care;Supervision/safety;Standing                   Toilet Transfer: Supervision/safety;Stand-pivot;RW(chair)             General ADL Comments: Pt anxious to get OOB to chair.  He did not feel he needed to practice transfer to 3:1; states he will be going into bathroom later.  Reviewed knee precautions and sidestepping through tight spaces       Vision       Perception     Praxis      Cognition Arousal/Alertness: Awake/alert Behavior During Therapy: WFL for tasks assessed/performed Overall Cognitive Status: Within Functional Limits for tasks assessed                                           Exercises     Shoulder Instructions       General Comments      Pertinent Vitals/ Pain       Pain Assessment: Faces Faces Pain Scale: Hurts little more Pain Location: left knee, with WB Pain Descriptors / Indicators: Sore Pain Intervention(s): Limited activity within patient's tolerance;Monitored during session;Premedicated before session;Repositioned;Ice applied  Home Living                                          Prior Functioning/Environment              Frequency           Progress Toward Goals  OT Goals(current goals can now be found in the care plan section)  Progress towards OT goals: Progressing toward goals     Plan      Co-evaluation  AM-PAC PT "6 Clicks" Daily Activity     Outcome Measure   Help from another person eating meals?: None Help from another person taking care of personal grooming?: A Little Help from another person toileting, which includes using toliet, bedpan, or urinal?: A Little Help from another person bathing (including washing, rinsing, drying)?: A Lot Help from another person to put on and taking off regular upper body clothing?: A Little Help from another person to put on and taking off regular lower body clothing?: A Lot 6 Click Score: 17    End of Session CPM Left Knee CPM Left Knee: Off      Activity Tolerance Patient tolerated treatment well   Patient Left in chair;with call bell/phone within reach;with nursing/sitter in room   Nurse Communication          Time: 8315-1761 OT Time Calculation (min): 18 min  Charges: OT General Charges $OT Visit: 1 Visit OT Treatments $Self Care/Home Management : 8-22 mins  Lesle Chris, OTR/L [phone] 11/26/2017   Ranetta Armacost 11/26/2017, 9:37 AM

## 2017-11-26 NOTE — Progress Notes (Signed)
Physical Therapy Treatment Patient Details Name: Jerry [name] MRN: [id number] DOB: 1951-01-29 Today's Date: 11/26/2017    History of Present Illness 67 yo male adm 11/22/17 for L TKA; PMHx: CHF, HTN, CAD, chronic back pain, bil TKA, back surgery    PT Comments     the patient did perform 4 steps this visit with much assist and needed to rest after performing. Should be ready for Dc  After PT in AM.  Follow Up Recommendations   HHPT     Equipment Recommendations  Rolling walker with 5" wheels    Recommendations for Other Services       Precautions / Restrictions Precautions Precautions: Fall;Knee Required Braces or Orthoses: Knee Immobilizer - Left    Mobility  Bed Mobility   Bed Mobility: Supine to Sit       Sit to supine: Min guard      Transfers Overall transfer level: Needs assistance Equipment used: Rolling walker (2 wheeled) Transfers: Sit to/from Stand Sit to Stand: Supervision;Min guard         General transfer comment: min guard for sitting to ensure that chair did not move  Ambulation/Gait Ambulation/Gait assistance: Min assist;+2 safety/equipment Ambulation Distance (Feet): 60 Feet Assistive device: Rolling walker (2 wheeled) Gait Pattern/deviations: Step-to pattern;Step-through pattern Gait velocity: decr   General Gait Details: decreased endurance but did  ambulate farther,   Stairs Stairs: Yes Stairs assistance: Mod assist;+2 safety/equipment Stair Management: One rail Right;With crutches Number of Stairs: 4 General stair comments: improved with 1 crutch. Fatigued and had to sit down in recliner.   Wheelchair Mobility    Modified Rankin (Stroke Patients Only)       Balance                                            Cognition Arousal/Alertness: Awake/alert Behavior During Therapy: WFL for tasks assessed/performed                                          Exercises Total Joint  Exercises Ankle Circles/Pumps: AROM;Both;10 reps Quad Sets: AROM;Both;15 reps Towel Squeeze: AROM;Both;10 reps Heel Slides: AAROM;Left;10 reps Straight Leg Raises: AAROM;Left;10 reps Long Arc Quad: AROM;Left;10 reps Knee Flexion: AROM;Left;10 reps    General Comments        Pertinent Vitals/Pain Pain Score: 6  Pain Location: left knee, with WB Pain Descriptors / Indicators: Sore Pain Intervention(s): Monitored during session;Premedicated before session;Ice applied    Home Living                      Prior Function            PT Goals (current goals can now be found in the care plan section) Progress towards PT goals: Progressing toward goals    Frequency    7X/week      PT Plan Current plan remains appropriate    Co-evaluation              AM-PAC PT "6 Clicks" Daily Activity  Outcome Measure  Difficulty turning over in bed (including adjusting bedclothes, sheets and blankets)?: A Little Difficulty moving from lying on back to sitting on the side of the bed? : A Little Difficulty sitting down on and standing up from a chair  with arms (e.g., wheelchair, bedside commode, etc,.)?: A Lot Help needed moving to and from a bed to chair (including a wheelchair)?: A Lot Help needed walking in hospital room?: A Lot Help needed climbing 3-5 steps with a railing? : A Lot 6 Click Score: 14    End of Session Equipment Utilized During Treatment: Gait belt;Left knee immobilizer Activity Tolerance: Patient limited by fatigue;Patient limited by pain;Patient tolerated treatment well Patient left: in chair;with call bell/phone within reach Nurse Communication: Mobility status PT Visit Diagnosis: Difficulty in walking, not elsewhere classified (R26.2)     Time: 3016-0109 PT Time Calculation (min) (ACUTE ONLY): 40 min  Charges:  $Gait Training: 23-37 mins $Therapeutic Exercise: 8-22 mins                    G CodesTresa Endo  PT [phone]    Claretha Cooper 11/26/2017, 5:53 PM

## 2017-11-26 NOTE — Progress Notes (Deleted)
Pt started liquids 1540. Tolerating well. RN will monitor.

## 2017-11-26 NOTE — Progress Notes (Signed)
Subjective: 4 Days Post-Op Procedure(s) (LRB): LEFT TOTAL KNEE ARTHROPLASTY (Left) Patient reports pain as moderate.  Bowel issues are significant.  Objective: Vital signs in last 24 hours: Temp:  [98.4 F (36.9 C)-98.8 F (37.1 C)] 98.4 F (36.9 C) (04/23 0548) Pulse Rate:  [84-98] 84 (04/23 0548) Resp:  [15-18] 15 (04/23 0548) BP: (136-148)/(64-74) 148/74 (04/23 0548) SpO2:  [95 %-100 %] 100 % (04/23 0548)  Intake/Output from previous day: 04/22 0701 - 04/23 0700 In: 1700 [P.O.:1300; I.V.:400] Out: 3125 [Urine:3125] Intake/Output this shift: Total I/O In: -  Out: 200 [Urine:200]  Recent Labs    11/24/17 0437 11/25/17 0439  HGB 9.9* 9.1*   Recent Labs    11/24/17 0437 11/25/17 0439  WBC 14.3* 8.6  RBC 3.31* 3.01*  HCT 30.6* 28.1*  PLT 204 159   No results for input(s): NA, K, CL, CO2, BUN, CREATININE, GLUCOSE, CALCIUM in the last 72 hours. No results for input(s): LABPT, INR in the last 72 hours.  Neurologically intact ABD soft Neurovascular intact No cellulitis present Compartment soft    Assessment/Plan: 4 Days Post-Op Procedure(s) (LRB): LEFT TOTAL KNEE ARTHROPLASTY (Left) Advance diet Up with therapy  Continue to plan for D/C when clears PT for distance and stairs. Had a terrible experience last time in skilled nursing and does not want to go back there. Aggressive bowell regimen    Alta Corning 11/26/2017, 8:19 AM

## 2017-11-27 NOTE — Progress Notes (Signed)
Subjective: 5 Days Post-Op Procedure(s) (LRB): LEFT TOTAL KNEE ARTHROPLASTY (Left) Patient reports pain as moderate.    Objective: Vital signs in last 24 hours: Temp:  [98.6 F (37 C)-98.8 F (37.1 C)] 98.8 F (37.1 C) (04/23 1948) Pulse Rate:  [93-101] 101 (04/23 1948) Resp:  [18-20] 18 (04/23 1948) BP: (139-148)/(50-71) 148/71 (04/23 1948) SpO2:  [95 %-96 %] 96 % (04/23 1948)  Intake/Output from previous day: 04/23 0701 - 04/24 0700 In: 950 [P.O.:950] Out: 2275 [Urine:2275] Intake/Output this shift: No intake/output data recorded.  Recent Labs    11/25/17 0439  HGB 9.1*   Recent Labs    11/25/17 0439  WBC 8.6  RBC 3.01*  HCT 28.1*  PLT 159   No results for input(s): NA, K, CL, CO2, BUN, CREATININE, GLUCOSE, CALCIUM in the last 72 hours. No results for input(s): LABPT, INR in the last 72 hours.  Neurologically intact ABD soft Neurovascular intact Sensation intact distally Intact pulses distally No cellulitis present Compartment soft    Assessment/Plan: 5 Days Post-Op Procedure(s) (LRB): LEFT TOTAL KNEE ARTHROPLASTY (Left) Advance diet Up with therapy Discharge home with home health    Alta Corning 11/27/2017, 7:21 AM

## 2017-11-27 NOTE — Progress Notes (Signed)
Discharge instructions reviewed with patient. All questions answered. Patient wheeled to vehicle with belongings by nurse tech. 

## 2017-11-27 NOTE — Progress Notes (Signed)
Physical Therapy Treatment Patient Details Name: Jerry [name] MRN: [id number] DOB: 1951-02-07 Today's Date: 11/27/2017    History of Present Illness 67 yo male adm 11/22/17 for L TKA; PMHx: CHF, HTN, CAD, chronic back pain, bil TKA, back surgery    PT Comments    Patient is ready for Dc   Follow Up Recommendations  Follow surgeon's recommendation for DC plan and follow-up therapies     Equipment Recommendations  Rolling walker with 5" wheels    Recommendations for Other Services       Precautions / Restrictions Precautions Precautions: Fall;Knee Required Braces or Orthoses: Knee Immobilizer - Left    Mobility  Bed Mobility Overal bed mobility: Modified Independent                Transfers   Equipment used: Rolling walker (2 wheeled) Transfers: Sit to/from Stand Sit to Stand: Supervision;Min guard         General transfer comment: min guard for sitting to ensure that chair did not move  Ambulation/Gait Ambulation/Gait assistance: Min assist;+2 safety/equipment Ambulation Distance (Feet): 80 Feet Assistive device: Rolling walker (2 wheeled) Gait Pattern/deviations: Step-to pattern;Step-through pattern Gait velocity: decr       Stairs Stairs: Yes Stairs assistance: Mod assist;+2 safety/equipment Stair Management: One rail Right;With crutches Number of Stairs: 4 General stair comments: improved with 1 crutch.    Wheelchair Mobility    Modified Rankin (Stroke Patients Only)       Balance                                            Cognition Arousal/Alertness: Awake/alert                                            Exercises Total Joint Exercises Ankle Circles/Pumps: AROM;Both;10 reps Quad Sets: AROM;Both;15 reps Towel Squeeze: AROM;Both;10 reps Heel Slides: AAROM;Left;10 reps Hip ABduction/ADduction: AROM;Left;10 reps Straight Leg Raises: AAROM;Left;10 reps Long Arc Quad: AROM;Left;10  reps Knee Flexion: AROM;Left;10 reps Goniometric ROM: 10-50 left knee flexion    General Comments        Pertinent Vitals/Pain Pain Score: 4  Pain Location: left knee, with WB Pain Descriptors / Indicators: Sore;Aching Pain Intervention(s): Monitored during session;Premedicated before session;Ice applied    Home Living                      Prior Function            PT Goals (current goals can now be found in the care plan section) Progress towards PT goals: Progressing toward goals    Frequency    7X/week      PT Plan Current plan remains appropriate    Co-evaluation              AM-PAC PT "6 Clicks" Daily Activity  Outcome Measure  Difficulty turning over in bed (including adjusting bedclothes, sheets and blankets)?: A Little Difficulty moving from lying on back to sitting on the side of the bed? : A Little Difficulty sitting down on and standing up from a chair with arms (e.g., wheelchair, bedside commode, etc,.)?: A Lot Help needed moving to and from a bed to chair (including a wheelchair)?: A Lot Help needed walking in hospital room?: A  Lot Help needed climbing 3-5 steps with a railing? : A Lot 6 Click Score: 14    End of Session Equipment Utilized During Treatment: Gait belt Activity Tolerance: Patient tolerated treatment well Patient left: with call bell/phone within reach;in bed Nurse Communication: Mobility status PT Visit Diagnosis: Difficulty in walking, not elsewhere classified (R26.2)     Time: 1021-1173 PT Time Calculation (min) (ACUTE ONLY): 28 min  Charges:  $Gait Training: 8-22 mins $Therapeutic Exercise: 8-22 mins                    G Codes:         Claretha Cooper 11/27/2017, 2:18 PM

## 2017-11-28 NOTE — Discharge Summary (Signed)
Physician Discharge Summary  Patient ID: Jerry [name] MRN: [id number] DOB/AGE: 08/12/1950 66 y.o.  Admit date: 11/22/2017 Discharge date: 11/28/2017  Admission Diagnoses:  Discharge Diagnoses:  Principal Problem:   Primary osteoarthritis of left knee   Discharged Condition: fair  Hospital Course: Patient was admitted for left total knee replacement.  He is a obese patient with chronic narcotic use preoperatively.  He underwent total knee replacement and tolerated this reasonably well.  Postoperatively his course was complicated by inability to walk and climb stairs.  He had significant constipation postoperatively.  He required a 5-day admission to get over his physical therapy.  He refused going to nursing home because of his previous stay in a nursing home was so terrible.  Ultimately he did turn the corner got his constipation under control was able to walk safe distance and climb stairs safely.  He was discharged home in good condition.  Consults: None  Significant Diagnostic Studies: labs:  Recent Results (from the past 2160 hour(s))  Basic metabolic panel     Status: Abnormal   Collection Time: 08/31/17  3:24 AM  Result Value Ref Range   Sodium 136 135 - 145 mmol/L   Potassium 3.9 3.5 - 5.1 mmol/L   Chloride 99 (L) 101 - 111 mmol/L   CO2 27 22 - 32 mmol/L   Glucose, Bld 125 (H) 65 - 99 mg/dL   BUN 5 (L) 6 - 20 mg/dL   Creatinine, Ser 0.85 0.61 - 1.24 mg/dL   Calcium 8.9 8.9 - 10.3 mg/dL   GFR calc non Af Amer >60 >60 mL/min   GFR calc Af Amer >60 >60 mL/min    Comment: (NOTE) The eGFR has been calculated using the CKD EPI equation. This calculation has not been validated in all clinical situations. eGFR's persistently <60 mL/min signify possible Chronic Kidney Disease.    Anion gap 10 5 - 15  CBC with Differential/Platelet     Status: Abnormal   Collection Time: 09/02/17  2:57 AM  Result Value Ref Range   WBC 10.1 4.0 - 10.5 K/uL    Comment: WHITE COUNT  CONFIRMED ON SMEAR   RBC 3.81 (L) 4.22 - 5.81 MIL/uL   Hemoglobin 11.7 (L) 13.0 - 17.0 g/dL   HCT 35.1 (L) 39.0 - 52.0 %   MCV 92.1 78.0 - 100.0 fL   MCH 30.7 26.0 - 34.0 pg   MCHC 33.3 30.0 - 36.0 g/dL   RDW 14.4 11.5 - 15.5 %   Platelets 171 150 - 400 K/uL    Comment: PLATELET COUNT CONFIRMED BY SMEAR   Neutrophils Relative % 69 %   Lymphocytes Relative 21 %   Monocytes Relative 9 %   Eosinophils Relative 1 %   Basophils Relative 0 %   Neutro Abs 7.0 1.7 - 7.7 K/uL   Lymphs Abs 2.1 0.7 - 4.0 K/uL   Monocytes Absolute 0.9 0.1 - 1.0 K/uL   Eosinophils Absolute 0.1 0.0 - 0.7 K/uL   Basophils Absolute 0.0 0.0 - 0.1 K/uL  Basic metabolic panel     Status: Abnormal   Collection Time: 09/02/17  2:57 AM  Result Value Ref Range   Sodium 136 135 - 145 mmol/L   Potassium 3.8 3.5 - 5.1 mmol/L   Chloride 98 (L) 101 - 111 mmol/L   CO2 26 22 - 32 mmol/L   Glucose, Bld 105 (H) 65 - 99 mg/dL   BUN 6 6 - 20 mg/dL   Creatinine, Ser 0.80 0.61 -  1.24 mg/dL   Calcium 8.7 (L) 8.9 - 10.3 mg/dL   GFR calc non Af Amer >60 >60 mL/min   GFR calc Af Amer >60 >60 mL/min    Comment: (NOTE) The eGFR has been calculated using the CKD EPI equation. This calculation has not been validated in all clinical situations. eGFR's persistently <60 mL/min signify possible Chronic Kidney Disease.    Anion gap 12 5 - 15  Urinalysis, Routine w reflex microscopic     Status: Abnormal   Collection Time: 11/15/17 11:20 AM  Result Value Ref Range   Color, Urine STRAW (A) YELLOW   APPearance CLEAR CLEAR   Specific Gravity, Urine 1.002 (L) 1.005 - 1.030   pH 5.0 5.0 - 8.0   Glucose, UA NEGATIVE NEGATIVE mg/dL   Hgb urine dipstick NEGATIVE NEGATIVE   Bilirubin Urine NEGATIVE NEGATIVE   Ketones, ur NEGATIVE NEGATIVE mg/dL   Protein, ur NEGATIVE NEGATIVE mg/dL   Nitrite NEGATIVE NEGATIVE   Leukocytes, UA NEGATIVE NEGATIVE    Comment: Performed at [facility] [address] [place], [place]  [address]  Surgical pcr screen     Status: Abnormal   Collection Time: 11/15/17 11:20 AM  Result Value Ref Range   MRSA, PCR NEGATIVE NEGATIVE   Staphylococcus aureus POSITIVE (A) NEGATIVE    Comment: (NOTE) The Xpert SA Assay (FDA approved for NASAL specimens in patients 22 years of age and older), is one component of a comprehensive surveillance program. It is not intended to diagnose infection nor to guide or monitor treatment. Performed at [facility], [place] [address], [place], [place] [address]   Hemoglobin A1c     Status: None   Collection Time: 11/15/17 12:00 PM  Result Value Ref Range   Hgb A1c MFr Bld 5.0 4.8 - 5.6 %    Comment: (NOTE) Pre diabetes:          5.7%-6.4% Diabetes:              >6.4% Glycemic control for   <7.0% adults with diabetes    Mean Plasma Glucose 96.8 mg/dL    Comment: Performed at [facility] [address], [place], [place] [address]  APTT     Status: None   Collection Time: 11/15/17 12:00 PM  Result Value Ref Range   aPTT 30 24 - 36 seconds    Comment: Performed at [facility], [place] [address], [place], [place] [address]  Basic metabolic panel     Status: Abnormal   Collection Time: 11/15/17 12:00 PM  Result Value Ref Range   Sodium 142 135 - 145 mmol/L   Potassium 4.1 3.5 - 5.1 mmol/L   Chloride 101 101 - 111 mmol/L   CO2 29 22 - 32 mmol/L   Glucose, Bld 105 (H) 65 - 99 mg/dL   BUN 7 6 - 20 mg/dL   Creatinine, Ser 0.87 0.61 - 1.24 mg/dL   Calcium 9.4 8.9 - 10.3 mg/dL   GFR calc non Af Amer >60 >60 mL/min   GFR calc Af Amer >60 >60 mL/min    Comment: (NOTE) The eGFR has been calculated using the CKD EPI equation. This calculation has not been validated in all clinical situations. eGFR's persistently <60 mL/min signify possible Chronic Kidney Disease.    Anion gap 12 5 - 15    Comment: Performed at [facility], [place] [address], [place], [place] [address]  CBC WITH  DIFFERENTIAL  Status: Abnormal   Collection Time: 11/15/17 12:00 PM  Result Value Ref Range   WBC 5.4 4.0 - 10.5 K/uL   RBC 4.32 4.22 - 5.81 MIL/uL   Hemoglobin 12.7 (L) 13.0 - 17.0 g/dL   HCT 40.0 39.0 - 52.0 %   MCV 92.6 78.0 - 100.0 fL   MCH 29.4 26.0 - 34.0 pg   MCHC 31.8 30.0 - 36.0 g/dL   RDW 12.7 11.5 - 15.5 %   Platelets 202 150 - 400 K/uL   Neutrophils Relative % 60 %   Neutro Abs 3.2 1.7 - 7.7 K/uL   Lymphocytes Relative 30 %   Lymphs Abs 1.6 0.7 - 4.0 K/uL   Monocytes Relative 5 %   Monocytes Absolute 0.3 0.1 - 1.0 K/uL   Eosinophils Relative 5 %   Eosinophils Absolute 0.2 0.0 - 0.7 K/uL   Basophils Relative 0 %   Basophils Absolute 0.0 0.0 - 0.1 K/uL    Comment: Performed at [facility], [place] [address], [place], [place] [address]  Protime-INR     Status: None   Collection Time: 11/15/17 12:00 PM  Result Value Ref Range   Prothrombin Time 12.8 11.4 - 15.2 seconds   INR 0.97     Comment: Performed at [facility], [place] [address], [place], [place] [address]  Type and screen Order type and screen if day of surgery is less than 15 days from draw of preadmission visit or order morning of surgery if day of surgery is greater than 6 days from preadmission visit.     Status: None   Collection Time: 11/15/17 12:00 PM  Result Value Ref Range   ABO/RH(D) A NEG    Antibody Screen POS    Sample Expiration 11/18/2017    Antibody Identification ANTI C ANTI D    PT AG Type NEGATIVE FOR KIDD A ANTIGEN   Type and screen [place] REDRAW DUE TO  POSITIVE ANTIBODIES     Status: None   Collection Time: 11/22/17  8:30 AM  Result Value Ref Range   ABO/RH(D) A NEG    Antibody Screen POS    Sample Expiration 11/25/2017    Unit Number [id number]    Blood Component Type RED CELLS,LR    Unit division 00    Status of Unit REL FROM [facility]    Transfusion Status OK TO TRANSFUSE    Crossmatch Result COMPATIBLE    Donor AG  Type NEGATIVE FOR C ANTIGEN NEGATIVE FOR KIDD A ANTIGEN    Unit Number [id number]    Blood Component Type RED CELLS,LR    Unit division 00    Status of Unit REL FROM [facility]    Transfusion Status OK TO TRANSFUSE    Crossmatch Result COMPATIBLE    Donor AG Type NEGATIVE FOR C ANTIGEN NEGATIVE FOR KIDD A ANTIGEN   BPAM RBC     Status: None   Collection Time: 11/22/17  8:30 AM  Result Value Ref Range   Blood Product Unit Number [id number]    Unit Type and Rh 0600    Blood Product Expiration Date [id number]    Blood Product Unit Number [id number]    Unit Type and Rh 9500    Blood Product Expiration Date [id number]   CBC     Status: Abnormal   Collection Time: 11/23/17  5:09 AM  Result Value Ref Range   WBC 12.9 (H) 4.0 - 10.5 K/uL   RBC 3.63 (L) 4.22 -  5.81 MIL/uL   Hemoglobin 10.8 (L) 13.0 - 17.0 g/dL   HCT 34.0 (L) 39.0 - 52.0 %   MCV 93.7 78.0 - 100.0 fL   MCH 29.8 26.0 - 34.0 pg   MCHC 31.8 30.0 - 36.0 g/dL   RDW 12.9 11.5 - 15.5 %   Platelets 216 150 - 400 K/uL    Comment: Performed at [facility], [place] [address], [place], [place] [address]  Basic metabolic panel     Status: Abnormal   Collection Time: 11/23/17  5:09 AM  Result Value Ref Range   Sodium 139 135 - 145 mmol/L   Potassium 4.3 3.5 - 5.1 mmol/L   Chloride 101 101 - 111 mmol/L   CO2 28 22 - 32 mmol/L   Glucose, Bld 144 (H) 65 - 99 mg/dL   BUN 8 6 - 20 mg/dL   Creatinine, Ser 0.93 0.61 - 1.24 mg/dL   Calcium 8.8 (L) 8.9 - 10.3 mg/dL   GFR calc non Af Amer >60 >60 mL/min   GFR calc Af Amer >60 >60 mL/min    Comment: (NOTE) The eGFR has been calculated using the CKD EPI equation. This calculation has not been validated in all clinical situations. eGFR's persistently <60 mL/min signify possible Chronic Kidney Disease.    Anion gap 10 5 - 15    Comment: Performed at [facility], [place] [address] [place], [place] [address]  CBC     Status: Abnormal   Collection  Time: 11/24/17  4:37 AM  Result Value Ref Range   WBC 14.3 (H) 4.0 - 10.5 K/uL   RBC 3.31 (L) 4.22 - 5.81 MIL/uL   Hemoglobin 9.9 (L) 13.0 - 17.0 g/dL   HCT 30.6 (L) 39.0 - 52.0 %   MCV 92.4 78.0 - 100.0 fL   MCH 29.9 26.0 - 34.0 pg   MCHC 32.4 30.0 - 36.0 g/dL   RDW 12.8 11.5 - 15.5 %   Platelets 204 150 - 400 K/uL    Comment: Performed at [facility], [place] [address] [place], [place] [address]  CBC     Status: Abnormal   Collection Time: 11/25/17  4:39 AM  Result Value Ref Range   WBC 8.6 4.0 - 10.5 K/uL   RBC 3.01 (L) 4.22 - 5.81 MIL/uL   Hemoglobin 9.1 (L) 13.0 - 17.0 g/dL   HCT 28.1 (L) 39.0 - 52.0 %   MCV 93.4 78.0 - 100.0 fL   MCH 30.2 26.0 - 34.0 pg   MCHC 32.4 30.0 - 36.0 g/dL   RDW 12.9 11.5 - 15.5 %   Platelets 159 150 - 400 K/uL    Comment: Performed at [facility], [place] [address] [place], [place] [address]    Treatments: IV hydration, antibiotics: Ancef and anticoagulation: ASA  Discharge Exam: Blood pressure (!) 142/73, pulse 90, temperature 98.7 F (37.1 C), temperature source Oral, resp. rate 19, height '6\' 2"'  (1.88 m), weight 125.6 kg (277 lb), SpO2 95 %. Physical exam: Well-developed well-nourished patient in no acute distress. Alert and oriented x3 HEENT:within normal limits Cardiac: Regular rate and rhythm Pulmonary: Lungs clear to auscultation Abdomen: Soft and nontender.  Normal active bowel sounds  Musculoskeletal: (F knee significant soft tissue swelling.  Limited range of motion due to pain.  Wound was well-healed.  No signs of cellulitis or compartment syndrome. Disposition:   Discharge Instructions    Call MD / Call 911   Complete by:  As directed    If you experience chest pain or shortness of breath, CALL 911 and be transported to the hospital emergency room.  If you develope a fever above 101 F, pus (white drainage) or increased drainage or redness at the wound, or calf pain, call your surgeon's  office.   Constipation Prevention   Complete by:  As directed    Drink plenty of fluids.  Prune juice may be helpful.  You may use a stool softener, such as Colace (over the counter) 100 mg twice a day.  Use MiraLax (over the counter) for constipation as needed.   Diet - low sodium heart healthy   Complete by:  As directed    Discharge instructions   Complete by:  As directed    Follow up ortho in 2 weeks. Continue exercises and participate in home PT   Driving restrictions   Complete by:  As directed    No driving for 4 weeks   Increase activity slowly as tolerated   Complete by:  As directed      Allergies as of 11/27/2017      Reactions   Ciprofloxacin Anaphylaxis   Penicillins Rash   Has patient had a PCN reaction causing immediate rash, facial/tongue/throat swelling, SOB or lightheadedness with hypotension:unsure Has patient had a PCN reaction causing severe rash involving mucus membranes or skin necrosis:unsure Has patient had a PCN reaction that required hospitalization:unsure Has patient had a PCN reaction occurring within the last 10 years:NO If all of the above answers are "NO", then may proceed with Cephalosporin use.   Tramadol Hcl Rash      Medication List    STOP taking these medications   ibuprofen 200 MG tablet Commonly known as:  ADVIL,MOTRIN   meloxicam 7.5 MG tablet Commonly known as:  MOBIC     TAKE these medications   aspirin EC 325 MG tablet Take 1 tablet (325 mg total) by mouth 2 (two) times daily after a meal. Take x 1 month post op to decrease risk of blood clots. What changed:    medication strength  how much to take  when to take this  additional instructions   bisacodyl 10 MG suppository Commonly known as:  DULCOLAX Place 10 mg rectally as needed for moderate constipation.   calcium carbonate 500 MG chewable tablet Commonly known as:  TUMS - dosed in mg elemental calcium Chew 1 tablet by mouth daily as needed for indigestion or  heartburn.   cholecalciferol 1000 units tablet Commonly known as:  VITAMIN D Take 1,000 Units by mouth daily.   ferrous sulfate 325 (65 FE) MG tablet Take 325 mg by mouth daily.   furosemide 20 MG tablet Commonly known as:  LASIX Take 20 mg by mouth daily.   lovastatin 20 MG tablet Commonly known as:  MEVACOR Take 1 tablet (20 mg total) by mouth at bedtime.   MOVANTIK 25 MG Tabs tablet Generic drug:  naloxegol oxalate Take 12.5 mg by mouth daily.   multivitamin with minerals Tabs tablet Take 1 tablet by mouth daily. FOR SENIORS   Oxycodone HCl 20 MG Tabs Take 20 mg by mouth every 6 (six) hours as needed (pain).   oxyCODONE-acetaminophen 5-325 MG tablet Commonly known as:  PERCOCET/ROXICET Take 1-2 tablets by mouth every 6 (six) hours as needed for severe pain.   pantoprazole 40 MG tablet Commonly known as:  PROTONIX Take 1 tablet (40 mg total) by mouth 2 (two) times daily.   polyethylene glycol  packet Commonly known as:  MIRALAX / GLYCOLAX Take 17 g by mouth daily.   promethazine 25 MG tablet Commonly known as:  PHENERGAN Take 25 mg by mouth daily. For nausea.   silodosin 8 MG Caps capsule Commonly known as:  RAPAFLO Take 1 capsule (8 mg total) by mouth daily with breakfast.   tiZANidine 4 MG tablet Commonly known as:  ZANAFLEX Take 4 mg by mouth every 6 (six) hours as needed for muscle spasms.   traZODone 150 MG tablet Commonly known as:  DESYREL Take 150-300 mg by mouth at bedtime as needed for sleep.   Turmeric Curcumin Caps Take 1 capsule by mouth daily.   VISINE OP Place 1 drop into both eyes daily as needed (dry eyes).   vitamin C 500 MG tablet Commonly known as:  ASCORBIC ACID Take 500 mg by mouth daily.      Follow-up Information    Dorna Leitz, MD. Schedule an appointment as soon as possible for a visit in 2 weeks.   Specialty:  Orthopedic Surgery Contact information: [place] [address] [place],  [facility] Follow up.   Specialty:  Home Health Services Why:  physical therapy Contact information: [place] [address] [place] Follow up.   Why:  walker Contact information: [address] [place] [address] [phone]           Signed: Alta Corning 11/28/2017, 6:59 PM

## 2017-12-02 LAB — TYPE AND SCREEN
ABO/RH(D): A NEG
ANTIBODY SCREEN: POSITIVE
DONOR AG TYPE: NEGATIVE
Donor AG Type: NEGATIVE
UNIT DIVISION: 0
UNIT DIVISION: 0

## 2017-12-02 LAB — BPAM RBC
Blood Product Expiration Date: 201905182359
Blood Product Expiration Date: 201905192359
Unit Type and Rh: 600
Unit Type and Rh: 9500

## 2017-12-13 ENCOUNTER — Ambulatory Visit: Payer: Medicare HMO | Admitting: Cardiology

## 2018-03-18 ENCOUNTER — Other Ambulatory Visit: Payer: Self-pay | Admitting: *Deleted

## 2018-03-18 DIAGNOSIS — C493 Malignant neoplasm of connective and soft tissue of thorax: Secondary | ICD-10-CM

## 2018-03-18 NOTE — Progress Notes (Unsigned)
Ct chest

## 2018-04-15 ENCOUNTER — Other Ambulatory Visit: Payer: Self-pay | Admitting: *Deleted

## 2018-04-15 DIAGNOSIS — C760 Malignant neoplasm of head, face and neck: Secondary | ICD-10-CM

## 2018-04-15 NOTE — Progress Notes (Unsigned)
ct 

## 2018-04-17 ENCOUNTER — Ambulatory Visit: Payer: Medicare HMO | Admitting: Thoracic Surgery (Cardiothoracic Vascular Surgery)

## 2018-04-29 ENCOUNTER — Ambulatory Visit: Payer: Medicare HMO | Admitting: Thoracic Surgery (Cardiothoracic Vascular Surgery)

## 2018-05-12 ENCOUNTER — Encounter (HOSPITAL_COMMUNITY): Payer: Self-pay | Admitting: *Deleted

## 2018-05-12 ENCOUNTER — Other Ambulatory Visit: Payer: Self-pay

## 2018-05-12 NOTE — Progress Notes (Signed)
Pt denies any acute cardiopulmonary issues. Pt under the care of Dr. Gwenlyn Found, Cardiology. Pt made aware to stop taking vitamins, herbal medications and Turmeric. Pt verbalized understanding of all pre-op instructions. See anesthesia note.

## 2018-05-12 NOTE — Progress Notes (Signed)
   05/12/18 1839  OBSTRUCTIVE SLEEP APNEA  Have you ever been diagnosed with sleep apnea through a sleep study? No  Do you snore loudly (loud enough to be heard through closed doors)?  0  Do you often feel tired, fatigued, or sleepy during the daytime (such as falling asleep during driving or talking to someone)? 0  Has anyone observed you stop breathing during your sleep? 0  Do you have, or are you being treated for high blood pressure? 1  BMI more than 35 kg/m2? 1  Age > 50 (1-yes) 1  Neck circumference greater than:Male 16 inches or larger, Male 17inches or larger? 1  Male Gender (Yes=1) 1  Obstructive Sleep Apnea Score 5
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
## 2018-05-13 ENCOUNTER — Ambulatory Visit (HOSPITAL_COMMUNITY)
Admission: RE | Admit: 2018-05-13 | Discharge: 2018-05-13 | Disposition: A | Discharge status: Home / Self Care | Payer: Medicare HMO | Source: Ambulatory Visit | Attending: Thoracic Surgery (Cardiothoracic Vascular Surgery) | Admitting: Thoracic Surgery (Cardiothoracic Vascular Surgery)

## 2018-05-13 ENCOUNTER — Ambulatory Visit (HOSPITAL_COMMUNITY): Payer: Medicare HMO | Admitting: Physician Assistant

## 2018-05-13 ENCOUNTER — Encounter (HOSPITAL_COMMUNITY): Admission: RE | Disposition: A | Discharge status: Home / Self Care | Payer: Self-pay | Source: Ambulatory Visit

## 2018-05-13 ENCOUNTER — Encounter (HOSPITAL_COMMUNITY): Payer: Self-pay | Admitting: Certified Registered Nurse Anesthetist

## 2018-05-13 DIAGNOSIS — Z85830 Personal history of malignant neoplasm of bone: Secondary | ICD-10-CM | POA: Diagnosis not present

## 2018-05-13 DIAGNOSIS — Z87891 Personal history of nicotine dependence: Secondary | ICD-10-CM | POA: Insufficient documentation

## 2018-05-13 DIAGNOSIS — C493 Malignant neoplasm of connective and soft tissue of thorax: Secondary | ICD-10-CM | POA: Insufficient documentation

## 2018-05-13 DIAGNOSIS — F419 Anxiety disorder, unspecified: Secondary | ICD-10-CM | POA: Diagnosis not present

## 2018-05-13 DIAGNOSIS — Z955 Presence of coronary angioplasty implant and graft: Secondary | ICD-10-CM | POA: Diagnosis not present

## 2018-05-13 DIAGNOSIS — Z79899 Other long term (current) drug therapy: Secondary | ICD-10-CM | POA: Diagnosis not present

## 2018-05-13 DIAGNOSIS — I2510 Atherosclerotic heart disease of native coronary artery without angina pectoris: Secondary | ICD-10-CM

## 2018-05-13 DIAGNOSIS — J90 Pleural effusion, not elsewhere classified: Secondary | ICD-10-CM | POA: Insufficient documentation

## 2018-05-13 DIAGNOSIS — Z96653 Presence of artificial knee joint, bilateral: Secondary | ICD-10-CM | POA: Insufficient documentation

## 2018-05-13 DIAGNOSIS — I700 Atherosclerosis of aorta: Secondary | ICD-10-CM | POA: Insufficient documentation

## 2018-05-13 DIAGNOSIS — K760 Fatty (change of) liver, not elsewhere classified: Secondary | ICD-10-CM | POA: Insufficient documentation

## 2018-05-13 DIAGNOSIS — I509 Heart failure, unspecified: Secondary | ICD-10-CM | POA: Diagnosis not present

## 2018-05-13 DIAGNOSIS — Z7982 Long term (current) use of aspirin: Secondary | ICD-10-CM | POA: Diagnosis not present

## 2018-05-13 DIAGNOSIS — Z08 Encounter for follow-up examination after completed treatment for malignant neoplasm: Secondary | ICD-10-CM | POA: Insufficient documentation

## 2018-05-13 DIAGNOSIS — I110 Hypertensive heart disease with heart failure: Secondary | ICD-10-CM | POA: Insufficient documentation

## 2018-05-13 HISTORY — DX: Headache: R51

## 2018-05-13 HISTORY — PX: RADIOLOGY WITH ANESTHESIA: SHX6223

## 2018-05-13 HISTORY — DX: Acute myocardial infarction, unspecified: I21.9

## 2018-05-13 HISTORY — DX: Headache, unspecified: R51.9

## 2018-05-13 HISTORY — DX: Personal history of other diseases of the digestive system: Z87.19

## 2018-05-13 SURGERY — CT WITH ANESTHESIA
Anesthesia: Monitor Anesthesia Care

## 2018-05-13 MED ORDER — MIDAZOLAM HCL 5 MG/5ML IJ SOLN
INTRAMUSCULAR | Status: DC | PRN
  Administered 2018-05-13 (×2): 1 mg via INTRAVENOUS

## 2018-05-13 MED ORDER — IPRATROPIUM-ALBUTEROL 0.5-2.5 (3) MG/3ML IN SOLN
3.0000 mL | Freq: Four times a day (QID) | RESPIRATORY_TRACT | Status: DC
  Administered 2018-05-13: 3 mL via RESPIRATORY_TRACT

## 2018-05-13 MED ORDER — PROPOFOL 10 MG/ML IV BOLUS
INTRAVENOUS | Status: AC
  Filled 2018-05-13: qty 20

## 2018-05-13 MED ORDER — IPRATROPIUM-ALBUTEROL 0.5-2.5 (3) MG/3ML IN SOLN
RESPIRATORY_TRACT | Status: AC
  Administered 2018-05-13: 3 mL via RESPIRATORY_TRACT
  Filled 2018-05-13: qty 3

## 2018-05-13 MED ORDER — FENTANYL CITRATE (PF) 100 MCG/2ML IJ SOLN: 25.0000 ug | INTRAMUSCULAR | Status: DC | PRN

## 2018-05-13 MED ORDER — PROPOFOL 500 MG/50ML IV EMUL
INTRAVENOUS | Status: DC | PRN
  Administered 2018-05-13: 75 ug/kg/min via INTRAVENOUS

## 2018-05-13 MED ORDER — FENTANYL CITRATE (PF) 100 MCG/2ML IJ SOLN
INTRAMUSCULAR | Status: DC | PRN
  Administered 2018-05-13: 50 ug via INTRAVENOUS

## 2018-05-13 MED ORDER — LACTATED RINGERS IV SOLN
INTRAVENOUS | Status: DC
  Administered 2018-05-13: 09:00:00 via INTRAVENOUS

## 2018-05-13 MED ORDER — ONDANSETRON HCL 4 MG/2ML IJ SOLN: 4.0000 mg | Freq: Once | INTRAMUSCULAR | Status: DC | PRN

## 2018-05-13 MED ORDER — MIDAZOLAM HCL 2 MG/2ML IJ SOLN
INTRAMUSCULAR | Status: AC
  Filled 2018-05-13: qty 2

## 2018-05-13 MED ORDER — PROPOFOL 10 MG/ML IV BOLUS
INTRAVENOUS | Status: DC | PRN
  Administered 2018-05-13: 20 mg via INTRAVENOUS
  Administered 2018-05-13 (×2): 30 mg via INTRAVENOUS

## 2018-05-13 MED ORDER — FENTANYL CITRATE (PF) 250 MCG/5ML IJ SOLN
INTRAMUSCULAR | Status: AC
  Filled 2018-05-13: qty 5

## 2018-05-13 NOTE — H&P (Signed)
Anesthesia H&P Update: History and Physical Exam reviewed; patient is OK for planned anesthetic and procedure. ? ?

## 2018-05-13 NOTE — Anesthesia Procedure Notes (Signed)
Procedure Name: MAC Date/Time: 05/13/2018 10:35 AM Performed by: Trinna Post., CRNA Pre-anesthesia Checklist: Patient identified, Emergency Drugs available, Suction available and Patient being monitored Patient Re-evaluated:Patient Re-evaluated prior to induction Oxygen Delivery Method: Simple face mask

## 2018-05-13 NOTE — Anesthesia Preprocedure Evaluation (Addendum)
Anesthesia Evaluation  Patient identified by MRN, date of birth, ID band Patient awake    Reviewed: Allergy & Precautions, NPO status , Patient's Chart, lab work & pertinent test results  Airway Mallampati: II  TM Distance: >3 FB Neck ROM: Full    Dental  (+) Edentulous Upper, Edentulous Lower   Pulmonary shortness of breath, former smoker,    Pulmonary exam normal breath sounds clear to auscultation       Cardiovascular hypertension, Pt. on medications + CAD, + Past MI, + Cardiac Stents (x 2 ) and +CHF  Normal cardiovascular exam Rhythm:Regular Rate:Normal  ECG: NSR, rate 69  ECHO: Left ventricle: The cavity size was normal. Wall thickness was increased in a pattern of mild LVH. Systolic function was normal. The estimated ejection fraction was in the range of 55% to 60%. Although no diagnostic regional wall motion abnormality was identified, this possibility cannot be completely excluded on the basis of this study. Doppler parameters are consistent with abnormal left ventricular relaxation (grade 1 diastolic dysfunction). Aorta: The aorta was mildly dilated. Right atrium: The atrium was mildly dilated.  Sees Dr. Gwenlyn Found, Cardiology   Neuro/Psych  Headaches, PSYCHIATRIC DISORDERS Anxiety Depression    GI/Hepatic Neg liver ROS, hiatal hernia, GERD  Medicated and Controlled,  Endo/Other  negative endocrine ROS  Renal/GU negative Renal ROS     Musculoskeletal Chronic pain   Abdominal (+) + obese,   Peds  Hematology  (+) anemia , HLD   Anesthesia Other Findings  Resection of Chest Wall Sarcoma  Reproductive/Obstetrics                            Anesthesia Physical Anesthesia Plan  ASA: III  Anesthesia Plan: General   Post-op Pain Management:    Induction: Intravenous  PONV Risk Score and Plan: 2 and Ondansetron, Dexamethasone and Treatment may vary due to age or medical  condition  Airway Management Planned: LMA  Additional Equipment:   Intra-op Plan:   Post-operative Plan: Extubation in OR  Informed Consent: I have reviewed the patients History and Physical, chart, labs and discussed the procedure including the risks, benefits and alternatives for the proposed anesthesia with the patient or authorized representative who has indicated his/her understanding and acceptance.   Dental advisory given  Plan Discussed with: CRNA  Anesthesia Plan Comments:        Anesthesia Quick Evaluation

## 2018-05-13 NOTE — Transfer of Care (Signed)
Immediate Anesthesia Transfer of Care Note  Patient: Jerry [name]  Procedure(s) Performed: CT CHEST WITHOUT CONTRAST (N/A )  Patient Location: PACU  Anesthesia Type:MAC  Level of Consciousness: awake, alert  and oriented  Airway & Oxygen Therapy: Patient Spontanous Breathing  Post-op Assessment: Report given to RN and Post -op Vital signs reviewed and stable  Post vital signs: Reviewed and stable  Last Vitals:  Vitals Value Taken Time  BP 137/65 05/13/2018 10:50 AM  Temp    Pulse 65 05/13/2018 11:03 AM  Resp 18 05/13/2018 11:03 AM  SpO2 95 % 05/13/2018 11:03 AM  Vitals shown include unvalidated device data.  Last Pain:  Vitals:   05/13/18 0819  PainSc: 3       Patients Stated Pain Goal: 3 (70/76/15 1834)  Complications: No apparent anesthesia complications

## 2018-05-13 NOTE — Anesthesia Postprocedure Evaluation (Signed)
Anesthesia Post Note  Patient: Jerry [name]  Procedure(s) Performed: CT CHEST WITHOUT CONTRAST (N/A )     Patient location during evaluation: PACU Anesthesia Type: MAC Level of consciousness: awake and alert Pain management: pain level controlled Vital Signs Assessment: post-procedure vital signs reviewed and stable Respiratory status: spontaneous breathing, nonlabored ventilation, respiratory function stable and patient connected to nasal cannula oxygen Cardiovascular status: stable and blood pressure returned to baseline Postop Assessment: no apparent nausea or vomiting Anesthetic complications: no    Last Vitals:  Vitals:   05/13/18 1104 05/13/18 1129  BP: (!) 147/67 134/62  Pulse: 64 (!) 57  Resp: 19 18  Temp:  36.6 C  SpO2: 92% 95%    Last Pain:  Vitals:   05/13/18 1103  PainSc: 0-No pain                 Kym Scannell P Delora Gravatt

## 2018-05-14 ENCOUNTER — Encounter (HOSPITAL_COMMUNITY): Payer: Self-pay | Admitting: Radiology

## 2018-05-20 ENCOUNTER — Other Ambulatory Visit: Payer: Self-pay

## 2018-05-20 ENCOUNTER — Ambulatory Visit: Payer: Medicare HMO | Admitting: Thoracic Surgery (Cardiothoracic Vascular Surgery)

## 2018-05-20 ENCOUNTER — Encounter: Payer: Self-pay | Admitting: Thoracic Surgery (Cardiothoracic Vascular Surgery)

## 2018-05-20 VITALS — BP 171/80 | HR 75 | Resp 16 | Ht 74.0 in | Wt 295.0 lb

## 2018-05-20 DIAGNOSIS — I712 Thoracic aortic aneurysm, without rupture, unspecified: Secondary | ICD-10-CM

## 2018-05-20 DIAGNOSIS — Z9889 Other specified postprocedural states: Secondary | ICD-10-CM | POA: Diagnosis not present

## 2018-05-20 DIAGNOSIS — C493 Malignant neoplasm of connective and soft tissue of thorax: Secondary | ICD-10-CM
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
## 2018-07-15 ENCOUNTER — Other Ambulatory Visit: Payer: Self-pay

## 2018-07-15 ENCOUNTER — Inpatient Hospital Stay (HOSPITAL_COMMUNITY)
Admission: EM | Admit: 2018-07-15 | Discharge: 2018-07-20 | DRG: 246 | Disposition: A | Discharge status: Home / Self Care | Payer: Medicare HMO | Attending: Internal Medicine | Admitting: Internal Medicine

## 2018-07-15 ENCOUNTER — Encounter (HOSPITAL_COMMUNITY): Payer: Self-pay | Admitting: Emergency Medicine

## 2018-07-15 ENCOUNTER — Emergency Department (HOSPITAL_COMMUNITY): Payer: Medicare HMO

## 2018-07-15 DIAGNOSIS — I421 Obstructive hypertrophic cardiomyopathy: Secondary | ICD-10-CM | POA: Diagnosis present

## 2018-07-15 DIAGNOSIS — Z79891 Long term (current) use of opiate analgesic: Secondary | ICD-10-CM

## 2018-07-15 DIAGNOSIS — Z8249 Family history of ischemic heart disease and other diseases of the circulatory system: Secondary | ICD-10-CM

## 2018-07-15 DIAGNOSIS — R0602 Shortness of breath: Secondary | ICD-10-CM

## 2018-07-15 DIAGNOSIS — F419 Anxiety disorder, unspecified: Secondary | ICD-10-CM | POA: Diagnosis present

## 2018-07-15 DIAGNOSIS — D509 Iron deficiency anemia, unspecified: Secondary | ICD-10-CM | POA: Diagnosis present

## 2018-07-15 DIAGNOSIS — I878 Other specified disorders of veins: Secondary | ICD-10-CM

## 2018-07-15 DIAGNOSIS — R079 Chest pain, unspecified: Secondary | ICD-10-CM | POA: Diagnosis present

## 2018-07-15 DIAGNOSIS — I252 Old myocardial infarction: Secondary | ICD-10-CM

## 2018-07-15 DIAGNOSIS — I5033 Acute on chronic diastolic (congestive) heart failure: Secondary | ICD-10-CM

## 2018-07-15 DIAGNOSIS — I951 Orthostatic hypotension: Secondary | ICD-10-CM | POA: Diagnosis not present

## 2018-07-15 DIAGNOSIS — Z955 Presence of coronary angioplasty implant and graft: Secondary | ICD-10-CM

## 2018-07-15 DIAGNOSIS — Z7982 Long term (current) use of aspirin: Secondary | ICD-10-CM

## 2018-07-15 DIAGNOSIS — I25110 Atherosclerotic heart disease of native coronary artery with unstable angina pectoris: Secondary | ICD-10-CM | POA: Diagnosis not present

## 2018-07-15 DIAGNOSIS — F329 Major depressive disorder, single episode, unspecified: Secondary | ICD-10-CM | POA: Diagnosis present

## 2018-07-15 DIAGNOSIS — Z880 Allergy status to penicillin: Secondary | ICD-10-CM

## 2018-07-15 DIAGNOSIS — J449 Chronic obstructive pulmonary disease, unspecified: Secondary | ICD-10-CM | POA: Diagnosis present

## 2018-07-15 DIAGNOSIS — I200 Unstable angina: Secondary | ICD-10-CM

## 2018-07-15 DIAGNOSIS — K219 Gastro-esophageal reflux disease without esophagitis: Secondary | ICD-10-CM | POA: Diagnosis present

## 2018-07-15 DIAGNOSIS — E876 Hypokalemia: Secondary | ICD-10-CM | POA: Diagnosis present

## 2018-07-15 DIAGNOSIS — Z96653 Presence of artificial knee joint, bilateral: Secondary | ICD-10-CM | POA: Diagnosis present

## 2018-07-15 DIAGNOSIS — Z79899 Other long term (current) drug therapy: Secondary | ICD-10-CM

## 2018-07-15 DIAGNOSIS — I5043 Acute on chronic combined systolic (congestive) and diastolic (congestive) heart failure: Secondary | ICD-10-CM | POA: Diagnosis present

## 2018-07-15 DIAGNOSIS — Z888 Allergy status to other drugs, medicaments and biological substances status: Secondary | ICD-10-CM

## 2018-07-15 DIAGNOSIS — E785 Hyperlipidemia, unspecified: Secondary | ICD-10-CM | POA: Diagnosis present

## 2018-07-15 DIAGNOSIS — N179 Acute kidney failure, unspecified: Secondary | ICD-10-CM | POA: Diagnosis present

## 2018-07-15 DIAGNOSIS — I959 Hypotension, unspecified: Secondary | ICD-10-CM

## 2018-07-15 DIAGNOSIS — Z6836 Body mass index (BMI) 36.0-36.9, adult: Secondary | ICD-10-CM

## 2018-07-15 DIAGNOSIS — I110 Hypertensive heart disease with heart failure: Secondary | ICD-10-CM | POA: Diagnosis present

## 2018-07-15 DIAGNOSIS — C413 Malignant neoplasm of ribs, sternum and clavicle: Secondary | ICD-10-CM | POA: Diagnosis present

## 2018-07-15 DIAGNOSIS — G894 Chronic pain syndrome: Secondary | ICD-10-CM | POA: Diagnosis present

## 2018-07-15 DIAGNOSIS — Z87891 Personal history of nicotine dependence: Secondary | ICD-10-CM

## 2018-07-15 DIAGNOSIS — J9601 Acute respiratory failure with hypoxia: Secondary | ICD-10-CM | POA: Diagnosis present

## 2018-07-15 DIAGNOSIS — I5032 Chronic diastolic (congestive) heart failure: Secondary | ICD-10-CM | POA: Diagnosis present

## 2018-07-15 DIAGNOSIS — I2510 Atherosclerotic heart disease of native coronary artery without angina pectoris: Secondary | ICD-10-CM | POA: Diagnosis present

## 2018-07-15 DIAGNOSIS — I10 Essential (primary) hypertension: Secondary | ICD-10-CM | POA: Diagnosis present

## 2018-07-15 LAB — BASIC METABOLIC PANEL
Anion gap: 16 — ABNORMAL HIGH (ref 5–15)
BUN: 18 mg/dL (ref 8–23)
CALCIUM: 9.2 mg/dL (ref 8.9–10.3)
CO2: 32 mmol/L (ref 22–32)
Chloride: 85 mmol/L — ABNORMAL LOW (ref 98–111)
Creatinine, Ser: 1.29 mg/dL — ABNORMAL HIGH (ref 0.61–1.24)
GFR, EST NON AFRICAN AMERICAN: 57 mL/min — AB (ref >60)
Glucose, Bld: 146 mg/dL — ABNORMAL HIGH (ref 70–99)
Potassium: 3.4 mmol/L — ABNORMAL LOW (ref 3.5–5.1)
SODIUM: 133 mmol/L — AB (ref 135–145)

## 2018-07-15 LAB — CBC
HCT: 43.2 % (ref 39.0–52.0)
Hemoglobin: 13.3 g/dL (ref 13.0–17.0)
MCH: 27.7 pg (ref 26.0–34.0)
MCHC: 30.8 g/dL (ref 30.0–36.0)
MCV: 90 fL (ref 80.0–100.0)
NRBC: 0 % (ref 0.0–0.2)
PLATELETS: 346 10*3/uL (ref 150–400)
RBC: 4.8 MIL/uL (ref 4.22–5.81)
RDW: 13.8 % (ref 11.5–15.5)
WBC: 9.5 10*3/uL (ref 4.0–10.5)

## 2018-07-15 LAB — I-STAT TROPONIN, ED
TROPONIN I, POC: 0 ng/mL (ref 0.00–0.08)
Troponin i, poc: 0 ng/mL (ref 0.00–0.08)

## 2018-07-15 LAB — BRAIN NATRIURETIC PEPTIDE: B NATRIURETIC PEPTIDE 5: 5.9 pg/mL (ref 0.0–100.0)

## 2018-07-15 MED ORDER — ASPIRIN 81 MG PO CHEW
162.0000 mg | CHEWABLE_TABLET | Freq: Once | ORAL | Status: AC
  Administered 2018-07-15: 162 mg via ORAL
  Filled 2018-07-15: qty 2

## 2018-07-15 MED ORDER — FENTANYL CITRATE (PF) 100 MCG/2ML IJ SOLN
50.0000 ug | Freq: Once | INTRAMUSCULAR | Status: AC
  Administered 2018-07-15: 50 ug via INTRAVENOUS
  Filled 2018-07-15: qty 2

## 2018-07-15 NOTE — ED Triage Notes (Signed)
Pt reports a severe 8/10 migraine that started this afternoon. Sob that started this morning that has gotten worse throughout the day, lungs clear. Pt also reports 5/10 central cp that radiates to his left arm and jaw. Hx of htn and MIs.
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
## 2018-07-16 ENCOUNTER — Inpatient Hospital Stay (HOSPITAL_COMMUNITY): Payer: Medicare HMO

## 2018-07-16 ENCOUNTER — Other Ambulatory Visit (HOSPITAL_COMMUNITY): Payer: Medicare HMO

## 2018-07-16 ENCOUNTER — Encounter (HOSPITAL_COMMUNITY): Payer: Self-pay | Admitting: Internal Medicine

## 2018-07-16 DIAGNOSIS — D509 Iron deficiency anemia, unspecified: Secondary | ICD-10-CM | POA: Diagnosis present

## 2018-07-16 DIAGNOSIS — I951 Orthostatic hypotension: Secondary | ICD-10-CM | POA: Diagnosis not present

## 2018-07-16 DIAGNOSIS — R0602 Shortness of breath: Secondary | ICD-10-CM

## 2018-07-16 DIAGNOSIS — N179 Acute kidney failure, unspecified: Secondary | ICD-10-CM | POA: Diagnosis present

## 2018-07-16 DIAGNOSIS — I259 Chronic ischemic heart disease, unspecified: Secondary | ICD-10-CM | POA: Diagnosis not present

## 2018-07-16 DIAGNOSIS — I252 Old myocardial infarction: Secondary | ICD-10-CM | POA: Diagnosis not present

## 2018-07-16 DIAGNOSIS — I421 Obstructive hypertrophic cardiomyopathy: Secondary | ICD-10-CM | POA: Diagnosis not present

## 2018-07-16 DIAGNOSIS — J9601 Acute respiratory failure with hypoxia: Secondary | ICD-10-CM | POA: Diagnosis present

## 2018-07-16 DIAGNOSIS — I110 Hypertensive heart disease with heart failure: Secondary | ICD-10-CM | POA: Diagnosis present

## 2018-07-16 DIAGNOSIS — I10 Essential (primary) hypertension: Secondary | ICD-10-CM | POA: Diagnosis not present

## 2018-07-16 DIAGNOSIS — C413 Malignant neoplasm of ribs, sternum and clavicle: Secondary | ICD-10-CM | POA: Diagnosis present

## 2018-07-16 DIAGNOSIS — G894 Chronic pain syndrome: Secondary | ICD-10-CM | POA: Diagnosis present

## 2018-07-16 DIAGNOSIS — I5032 Chronic diastolic (congestive) heart failure: Secondary | ICD-10-CM | POA: Diagnosis not present

## 2018-07-16 DIAGNOSIS — Z955 Presence of coronary angioplasty implant and graft: Secondary | ICD-10-CM | POA: Diagnosis not present

## 2018-07-16 DIAGNOSIS — F329 Major depressive disorder, single episode, unspecified: Secondary | ICD-10-CM | POA: Diagnosis present

## 2018-07-16 DIAGNOSIS — E785 Hyperlipidemia, unspecified: Secondary | ICD-10-CM | POA: Diagnosis present

## 2018-07-16 DIAGNOSIS — I25110 Atherosclerotic heart disease of native coronary artery with unstable angina pectoris: Secondary | ICD-10-CM | POA: Diagnosis present

## 2018-07-16 DIAGNOSIS — I5043 Acute on chronic combined systolic (congestive) and diastolic (congestive) heart failure: Secondary | ICD-10-CM | POA: Diagnosis present

## 2018-07-16 DIAGNOSIS — I2510 Atherosclerotic heart disease of native coronary artery without angina pectoris: Secondary | ICD-10-CM | POA: Diagnosis not present

## 2018-07-16 DIAGNOSIS — Z79899 Other long term (current) drug therapy: Secondary | ICD-10-CM | POA: Diagnosis not present

## 2018-07-16 DIAGNOSIS — Z888 Allergy status to other drugs, medicaments and biological substances status: Secondary | ICD-10-CM | POA: Diagnosis not present

## 2018-07-16 DIAGNOSIS — J449 Chronic obstructive pulmonary disease, unspecified: Secondary | ICD-10-CM | POA: Diagnosis present

## 2018-07-16 DIAGNOSIS — Z880 Allergy status to penicillin: Secondary | ICD-10-CM | POA: Diagnosis not present

## 2018-07-16 DIAGNOSIS — R079 Chest pain, unspecified: Secondary | ICD-10-CM | POA: Diagnosis present

## 2018-07-16 DIAGNOSIS — Z87891 Personal history of nicotine dependence: Secondary | ICD-10-CM | POA: Diagnosis not present

## 2018-07-16 DIAGNOSIS — R600 Localized edema: Secondary | ICD-10-CM | POA: Diagnosis not present

## 2018-07-16 DIAGNOSIS — I5033 Acute on chronic diastolic (congestive) heart failure: Secondary | ICD-10-CM | POA: Diagnosis not present

## 2018-07-16 DIAGNOSIS — I959 Hypotension, unspecified: Secondary | ICD-10-CM | POA: Diagnosis not present

## 2018-07-16 DIAGNOSIS — K219 Gastro-esophageal reflux disease without esophagitis: Secondary | ICD-10-CM | POA: Diagnosis present

## 2018-07-16 DIAGNOSIS — Z96653 Presence of artificial knee joint, bilateral: Secondary | ICD-10-CM | POA: Diagnosis present

## 2018-07-16 DIAGNOSIS — F419 Anxiety disorder, unspecified: Secondary | ICD-10-CM | POA: Diagnosis present

## 2018-07-16 DIAGNOSIS — E876 Hypokalemia: Secondary | ICD-10-CM | POA: Diagnosis present

## 2018-07-16 LAB — CBC
HCT: 40.5 % (ref 39.0–52.0)
Hemoglobin: 12.3 g/dL — ABNORMAL LOW (ref 13.0–17.0)
MCH: 27.2 pg (ref 26.0–34.0)
MCHC: 30.4 g/dL (ref 30.0–36.0)
MCV: 89.4 fL (ref 80.0–100.0)
Platelets: 261 10*3/uL (ref 150–400)
RBC: 4.53 MIL/uL (ref 4.22–5.81)
RDW: 13.9 % (ref 11.5–15.5)
WBC: 9 10*3/uL (ref 4.0–10.5)
nRBC: 0 % (ref 0.0–0.2)

## 2018-07-16 LAB — CREATININE, SERUM
Creatinine, Ser: 1.14 mg/dL (ref 0.61–1.24)
GFR calc Af Amer: >60 mL/min (ref >60)
GFR calc non Af Amer: >60 mL/min (ref >60)

## 2018-07-16 LAB — HIV ANTIBODY (ROUTINE TESTING W REFLEX): HIV Screen 4th Generation wRfx: NONREACTIVE

## 2018-07-16 LAB — BASIC METABOLIC PANEL
Anion gap: 18 — ABNORMAL HIGH (ref 5–15)
BUN: 13 mg/dL (ref 8–23)
CO2: 28 mmol/L (ref 22–32)
Calcium: 9.3 mg/dL (ref 8.9–10.3)
Chloride: 90 mmol/L — ABNORMAL LOW (ref 98–111)
Creatinine, Ser: 1.07 mg/dL (ref 0.61–1.24)
GFR calc Af Amer: >60 mL/min (ref >60)
GFR calc non Af Amer: >60 mL/min (ref >60)
Glucose, Bld: 134 mg/dL — ABNORMAL HIGH (ref 70–99)
Potassium: 4.1 mmol/L (ref 3.5–5.1)
Sodium: 136 mmol/L (ref 135–145)

## 2018-07-16 LAB — D-DIMER, QUANTITATIVE: D-Dimer, Quant: 0.68 ug/mL-FEU — ABNORMAL HIGH (ref 0.00–0.50)

## 2018-07-16 LAB — TROPONIN I
Troponin I: <0.03 ng/mL (ref <0.03)
Troponin I: <0.03 ng/mL (ref <0.03)
Troponin I: <0.03 ng/mL (ref <0.03)

## 2018-07-16 MED ORDER — VITAMIN C 500 MG PO TABS
500.0000 mg | ORAL_TABLET | Freq: Every day | ORAL | Status: DC
  Administered 2018-07-16 – 2018-07-20 (×5): 500 mg via ORAL
  Filled 2018-07-16 (×5): qty 1

## 2018-07-16 MED ORDER — FENTANYL CITRATE (PF) 100 MCG/2ML IJ SOLN
25.0000 ug | INTRAMUSCULAR | Status: DC | PRN
  Administered 2018-07-16 – 2018-07-18 (×6): 25 ug via INTRAVENOUS
  Filled 2018-07-16 (×6): qty 2

## 2018-07-16 MED ORDER — PRAVASTATIN SODIUM 10 MG PO TABS
20.0000 mg | ORAL_TABLET | Freq: Every day | ORAL | Status: DC
  Administered 2018-07-16 – 2018-07-17 (×2): 20 mg via ORAL
  Filled 2018-07-16 (×4): qty 2

## 2018-07-16 MED ORDER — POTASSIUM CHLORIDE CRYS ER 20 MEQ PO TBCR: 10.0000 meq | EXTENDED_RELEASE_TABLET | Freq: Every day | ORAL | Status: DC

## 2018-07-16 MED ORDER — NITROGLYCERIN 0.4 MG/HR TD PT24
0.4000 mg | MEDICATED_PATCH | Freq: Every day | TRANSDERMAL | Status: DC
  Administered 2018-07-16 – 2018-07-20 (×6): 0.4 mg via TRANSDERMAL
  Filled 2018-07-16 (×6): qty 1

## 2018-07-16 MED ORDER — POLYETHYLENE GLYCOL 3350 17 G PO PACK
17.0000 g | PACK | Freq: Every day | ORAL | Status: DC
  Administered 2018-07-16 – 2018-07-20 (×5): 17 g via ORAL
  Filled 2018-07-16 (×5): qty 1

## 2018-07-16 MED ORDER — TIZANIDINE HCL 4 MG PO TABS
4.0000 mg | ORAL_TABLET | Freq: Four times a day (QID) | ORAL | Status: DC | PRN
  Administered 2018-07-16 – 2018-07-19 (×9): 4 mg via ORAL
  Filled 2018-07-16 (×12): qty 1

## 2018-07-16 MED ORDER — VITAMIN D 25 MCG (1000 UNIT) PO TABS
1000.0000 [IU] | ORAL_TABLET | Freq: Every day | ORAL | Status: DC
  Administered 2018-07-16 – 2018-07-20 (×5): 1000 [IU] via ORAL
  Filled 2018-07-16 (×5): qty 1

## 2018-07-16 MED ORDER — POTASSIUM CHLORIDE CRYS ER 20 MEQ PO TBCR
20.0000 meq | EXTENDED_RELEASE_TABLET | Freq: Once | ORAL | Status: AC
  Administered 2018-07-16: 20 meq via ORAL
  Filled 2018-07-16: qty 1

## 2018-07-16 MED ORDER — ACETAMINOPHEN 325 MG PO TABS: 650.0000 mg | ORAL_TABLET | ORAL | Status: DC | PRN

## 2018-07-16 MED ORDER — FUROSEMIDE 20 MG PO TABS: 40.0000 mg | ORAL_TABLET | Freq: Every day | ORAL | Status: DC

## 2018-07-16 MED ORDER — BISACODYL 10 MG RE SUPP
10.0000 mg | Freq: Every day | RECTAL | Status: DC | PRN
  Administered 2018-07-19: 10 mg via RECTAL
  Filled 2018-07-16: qty 1

## 2018-07-16 MED ORDER — CALCIUM CARBONATE ANTACID 500 MG PO CHEW
1.0000 | CHEWABLE_TABLET | Freq: Every day | ORAL | Status: DC | PRN
  Filled 2018-07-16: qty 1

## 2018-07-16 MED ORDER — FERROUS SULFATE 325 (65 FE) MG PO TABS
325.0000 mg | ORAL_TABLET | Freq: Every day | ORAL | Status: DC
  Administered 2018-07-16 – 2018-07-20 (×5): 325 mg via ORAL
  Filled 2018-07-16 (×5): qty 1

## 2018-07-16 MED ORDER — OXYCODONE HCL 5 MG PO TABS
20.0000 mg | ORAL_TABLET | Freq: Four times a day (QID) | ORAL | Status: DC | PRN
  Administered 2018-07-16 – 2018-07-20 (×13): 20 mg via ORAL
  Filled 2018-07-16 (×13): qty 4

## 2018-07-16 MED ORDER — ASPIRIN EC 81 MG PO TBEC
81.0000 mg | DELAYED_RELEASE_TABLET | Freq: Every day | ORAL | Status: DC
  Administered 2018-07-16 – 2018-07-20 (×5): 81 mg via ORAL
  Filled 2018-07-16 (×5): qty 1

## 2018-07-16 MED ORDER — FUROSEMIDE 10 MG/ML IJ SOLN
40.0000 mg | Freq: Two times a day (BID) | INTRAMUSCULAR | Status: DC
  Administered 2018-07-16 – 2018-07-17 (×2): 40 mg via INTRAVENOUS
  Filled 2018-07-16 (×2): qty 4

## 2018-07-16 MED ORDER — TAMSULOSIN HCL 0.4 MG PO CAPS
0.4000 mg | ORAL_CAPSULE | Freq: Every day | ORAL | Status: DC
  Administered 2018-07-16 – 2018-07-19 (×4): 0.4 mg via ORAL
  Filled 2018-07-16 (×5): qty 1

## 2018-07-16 MED ORDER — NALOXEGOL OXALATE 25 MG PO TABS
25.0000 mg | ORAL_TABLET | Freq: Every day | ORAL | Status: DC
  Administered 2018-07-16 – 2018-07-20 (×5): 25 mg via ORAL
  Filled 2018-07-16 (×5): qty 1

## 2018-07-16 MED ORDER — ADULT MULTIVITAMIN W/MINERALS CH
1.0000 | ORAL_TABLET | Freq: Every day | ORAL | Status: DC
  Administered 2018-07-16 – 2018-07-20 (×5): 1 via ORAL
  Filled 2018-07-16 (×5): qty 1

## 2018-07-16 MED ORDER — ONDANSETRON HCL 4 MG/2ML IJ SOLN
4.0000 mg | Freq: Four times a day (QID) | INTRAMUSCULAR | Status: DC | PRN
  Administered 2018-07-16 – 2018-07-18 (×3): 4 mg via INTRAVENOUS
  Filled 2018-07-16 (×3): qty 2

## 2018-07-16 MED ORDER — TRAZODONE HCL 150 MG PO TABS
300.0000 mg | ORAL_TABLET | Freq: Every day | ORAL | Status: DC
  Administered 2018-07-16: 300 mg via ORAL
  Administered 2018-07-16: 150 mg via ORAL
  Administered 2018-07-17 – 2018-07-19 (×2): 300 mg via ORAL
  Filled 2018-07-16 (×3): qty 6
  Filled 2018-07-16 (×2): qty 2

## 2018-07-16 MED ORDER — ENOXAPARIN SODIUM 40 MG/0.4ML ~~LOC~~ SOLN
40.0000 mg | Freq: Every day | SUBCUTANEOUS | Status: DC
  Administered 2018-07-16 – 2018-07-17 (×2): 40 mg via SUBCUTANEOUS
  Filled 2018-07-16 (×2): qty 0.4

## 2018-07-16 MED ORDER — ALBUTEROL SULFATE (2.5 MG/3ML) 0.083% IN NEBU: 2.5000 mg | INHALATION_SOLUTION | Freq: Four times a day (QID) | RESPIRATORY_TRACT | Status: DC | PRN

## 2018-07-16 MED ORDER — NORTRIPTYLINE HCL 25 MG PO CAPS
25.0000 mg | ORAL_CAPSULE | Freq: Every day | ORAL | Status: DC
  Administered 2018-07-16 – 2018-07-19 (×4): 25 mg via ORAL
  Filled 2018-07-16 (×5): qty 1

## 2018-07-16 MED ORDER — PANTOPRAZOLE SODIUM 40 MG PO TBEC
40.0000 mg | DELAYED_RELEASE_TABLET | Freq: Two times a day (BID) | ORAL | Status: DC
  Administered 2018-07-16 – 2018-07-20 (×10): 40 mg via ORAL
  Filled 2018-07-16 (×10): qty 1

## 2018-07-16 MED ORDER — ESCITALOPRAM OXALATE 10 MG PO TABS
20.0000 mg | ORAL_TABLET | Freq: Every day | ORAL | Status: DC
  Administered 2018-07-16 – 2018-07-20 (×5): 20 mg via ORAL
  Filled 2018-07-16: qty 2
  Filled 2018-07-16: qty 1
  Filled 2018-07-16: qty 2
  Filled 2018-07-16 (×2): qty 1

## 2018-07-16 NOTE — Progress Notes (Addendum)
PROGRESS NOTE    Jerry [name]  HMC:[id number] DOB: [birth year] DOA: 07/15/2018 PCP: Aletha Halim., PA-C    Brief Narrative:  67 year old male who presented with chest pain.  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  Patient reported to 3 weeks of exertional dyspnea.  Over the last 24 hours he has been experiencing chest pain, constant, retrosternal, radiating to left arm and jaw.  He came to the hospital due to persistent symptoms.  On his initial physical examination blood pressure 120/67, heart rate 98, respiratory rate 20, temperature is 97.4, oxygen saturation 97%.  His lungs are clear to auscultation bilaterally, heart S1-S2 present rhythmic abdomen soft nontender, no lower extremity edema.  Patient was admitted to the hospital working diagnosis of chest pain to rule out acute coronary syndrome.   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 Acute chest pain complicated with acute hypoxic respiratory failure. Ok to admit to telemetry, will continue to trend cardiac enzymes, follow on echocardiography. Chest film cardiomegaly and bibasilar increased interstitial changes, bilateral pleural effusions, (small). Will continue oxymetry monitoring and supplemental 02 per [place], will add gentle diuresis with furosemide. High d dimer, will check chest CT, patient with history or rib osteosarcoma.   2. COPD. No signs of acute exacerbation, no increase mucosus production or wheezing, will continue bronchodilator therapy.   3. Acute on chronic diastolic heart failure. Will continue furosemide for diuresis to target negative fluid balance, currently not on b blocker.   4.  Anemia of iron deficiency. Continue iron supplementation.   5. AKI. Peak cr at 1,29 from 0.93, will continue close monitoring, avoid hypotension, will continue diuresis for now, follow on renal panel in am.   DVT prophylaxis: enoxaparin   Code Status: full Family Communication: no family at the bedside  Disposition Plan/ discharge barriers: pending clinical improvement   Body mass index is 36.59 kg/m. Malnutrition Type:      Malnutrition Characteristics:      Nutrition Interventions:     RN Pressure Injury Documentation:     Consultants:   Cardiology   Procedures:     Antimicrobials:       Subjective: Patient continue to have dyspnea, moderate to severe in intensity, worse with movement, associated with pleuritic chest pain,  no nausea or vomiting. No fever or chills.   Objective: Vitals:   07/16/18 0730 07/16/18 0800 07/16/18 0830 07/16/18 0915  BP: 108/68 111/72 104/69 113/63  Pulse: 92 86 94 100  Resp:  17 16 18   Temp:      TempSrc:      SpO2: 93% 94% 95% 99%  Weight:      Height:       No intake or output data in the 24 hours ending 07/16/18 1034 Filed Weights   07/15/18 2036  Weight: 129.3 kg    Examination:   General: positive dyspnea,  Neurology: Awake and alert, non focal  E ENT: positive pallor, no icterus, oral mucosa moist Cardiovascular: No JVD. S1-S2 present, rhythmic, no gallops, rubs, or murmurs. ++ non pitting lower extremity edema. Pulmonary: decreased breath sounds bilaterally, poor air movement, no wheezing, rhonchi or rales. Gastrointestinal. Abdomen protuberant with no organomegaly, non  tender, no rebound or guarding Skin. No rashes Musculoskeletal: no joint deformities     Data Reviewed: I have personally reviewed following labs and imaging studies  CBC: Recent Labs  Lab 07/15/18 2039 07/16/18 0156  WBC 9.5 9.0  HGB 13.3 12.3*  HCT 43.2 40.5  MCV 90.0 89.4  PLT 346 759   Basic Metabolic Panel: Recent Labs   Lab 07/15/18 2039 07/16/18 0156  NA 133*  --   K 3.4*  --   CL 85*  --   CO2 32  --   GLUCOSE 146*  --   BUN 18  --   CREATININE 1.29* 1.14  CALCIUM 9.2  --    GFR: Estimated Creatinine Clearance: 89.8 mL/min (by C-G formula based on SCr of 1.14 mg/dL). Liver Function Tests: No results for input(s): AST, ALT, ALKPHOS, BILITOT, PROT, ALBUMIN in the last 168 hours. No results for input(s): LIPASE, AMYLASE in the last 168 hours. No results for input(s): AMMONIA in the last 168 hours. Coagulation Profile: No results for input(s): INR, PROTIME in the last 168 hours. Cardiac Enzymes: Recent Labs  Lab 07/16/18 0156 07/16/18 0735  TROPONINI <0.03 <0.03   BNP (last 3 results) No results for input(s): PROBNP in the last 8760 hours. HbA1C: No results for input(s): HGBA1C in the last 72 hours. CBG: No results for input(s): GLUCAP in the last 168 hours. Lipid Profile: No results for input(s): CHOL, HDL, LDLCALC, TRIG, CHOLHDL, LDLDIRECT in the last 72 hours. Thyroid Function Tests: No results for input(s): TSH, T4TOTAL, FREET4, T3FREE, THYROIDAB in the last 72 hours. Anemia Panel: No results for input(s): VITAMINB12, FOLATE, FERRITIN, TIBC, IRON, RETICCTPCT in the last 72 hours.    Radiology Studies: I have reviewed all of the imaging during this hospital visit personally     Scheduled Meds: . aspirin EC  81 mg Oral Daily  . cholecalciferol  1,000 Units Oral Daily  . enoxaparin (LOVENOX) injection  40 mg Subcutaneous Daily  . escitalopram  20 mg Oral Daily  . ferrous sulfate  325 mg Oral Q breakfast  . multivitamin with minerals  1 tablet Oral Daily  . naloxegol oxalate  25 mg Oral QPC breakfast  . nitroGLYCERIN  0.4 mg Transdermal Daily  . nortriptyline  25 mg Oral QHS  . pantoprazole  40 mg Oral BID  . polyethylene glycol  17 g Oral Daily  . pravastatin  20 mg Oral q1800  . tamsulosin  0.4 mg Oral QPC supper  . traZODone  300 mg Oral QHS  . vitamin C  500 mg Oral  Daily   Continuous Infusions:   LOS: 0 days        Mauricio Gerome Apley, MD [facility] Pager [phone]
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
## 2018-07-16 NOTE — Progress Notes (Signed)
Patient  Jerry [name] he cannot lye on his back because he broke his back and had several surgeries on it, he had to be sedated to have the CT done. MD notified and deferred the CT to tomorrow. Will continue to monitor patient.

## 2018-07-16 NOTE — ED Notes (Signed)
Pt observed at o2 Sat 82% nasal cannula applied at 2lpm

## 2018-07-16 NOTE — Progress Notes (Signed)
  Echocardiogram 2D Echocardiogram has been performed.  Jerry [name] 07/16/2018, 5:28 PM

## 2018-07-16 NOTE — ED Notes (Signed)
Phone call complete with Hulan Saas RN made aware that Pt's vitals are stable. Pt appropriate for hall bed
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
## 2018-07-17 ENCOUNTER — Encounter (HOSPITAL_COMMUNITY)
Admission: EM | Disposition: A | Discharge status: Home / Self Care | Payer: Self-pay | Source: Home / Self Care | Attending: Internal Medicine

## 2018-07-17 DIAGNOSIS — I200 Unstable angina: Secondary | ICD-10-CM

## 2018-07-17 DIAGNOSIS — I25110 Atherosclerotic heart disease of native coronary artery with unstable angina pectoris: Principal | ICD-10-CM

## 2018-07-17 HISTORY — PX: CORONARY STENT INTERVENTION: CATH118234

## 2018-07-17 HISTORY — PX: RIGHT/LEFT HEART CATH AND CORONARY ANGIOGRAPHY: CATH118266

## 2018-07-17 HISTORY — PX: INTRAVASCULAR PRESSURE WIRE/FFR STUDY: CATH118243

## 2018-07-17 LAB — POCT I-STAT 3, ART BLOOD GAS (G3+)
Acid-Base Excess: 8 mmol/L — ABNORMAL HIGH (ref 0.0–2.0)
BICARBONATE: 32.5 mmol/L — AB (ref 20.0–28.0)
O2 Saturation: 93 %
PO2 ART: 64 mmHg — AB (ref 83.0–108.0)
TCO2: 34 mmol/L — ABNORMAL HIGH (ref 22–32)
pCO2 arterial: 45.6 mmHg (ref 32.0–48.0)
pH, Arterial: 7.461 — ABNORMAL HIGH (ref 7.350–7.450)

## 2018-07-17 LAB — BASIC METABOLIC PANEL
Anion gap: 14 (ref 5–15)
BUN: 16 mg/dL (ref 8–23)
CO2: 33 mmol/L — ABNORMAL HIGH (ref 22–32)
Calcium: 9 mg/dL (ref 8.9–10.3)
Chloride: 91 mmol/L — ABNORMAL LOW (ref 98–111)
Creatinine, Ser: 1.19 mg/dL (ref 0.61–1.24)
GFR calc Af Amer: >60 mL/min (ref >60)
GFR calc non Af Amer: >60 mL/min (ref >60)
GLUCOSE: 144 mg/dL — AB (ref 70–99)
Potassium: 3.6 mmol/L (ref 3.5–5.1)
Sodium: 138 mmol/L (ref 135–145)

## 2018-07-17 LAB — POCT I-STAT 3, VENOUS BLOOD GAS (G3P V)
Acid-Base Excess: 8 mmol/L — ABNORMAL HIGH (ref 0.0–2.0)
Acid-Base Excess: 9 mmol/L — ABNORMAL HIGH (ref 0.0–2.0)
Bicarbonate: 34.7 mmol/L — ABNORMAL HIGH (ref 20.0–28.0)
Bicarbonate: 35 mmol/L — ABNORMAL HIGH (ref 20.0–28.0)
O2 SAT: 63 %
O2 Saturation: 64 %
TCO2: 36 mmol/L — ABNORMAL HIGH (ref 22–32)
TCO2: 37 mmol/L — ABNORMAL HIGH (ref 22–32)
pCO2, Ven: 53.1 mmHg (ref 44.0–60.0)
pCO2, Ven: 53.1 mmHg (ref 44.0–60.0)
pH, Ven: 7.423 (ref 7.250–7.430)
pH, Ven: 7.426 (ref 7.250–7.430)
pO2, Ven: 33 mmHg (ref 32.0–45.0)
pO2, Ven: 33 mmHg (ref 32.0–45.0)

## 2018-07-17 LAB — PROTIME-INR
INR: 1.03
Prothrombin Time: 13.4 seconds (ref 11.4–15.2)

## 2018-07-17 LAB — CBC WITH DIFFERENTIAL/PLATELET
Abs Immature Granulocytes: 0.03 10*3/uL (ref 0.00–0.07)
Basophils Absolute: 0.1 10*3/uL (ref 0.0–0.1)
Basophils Relative: 1 %
Eosinophils Absolute: 0.3 10*3/uL (ref 0.0–0.5)
Eosinophils Relative: 3 %
HCT: 41.2 % (ref 39.0–52.0)
Hemoglobin: 12.7 g/dL — ABNORMAL LOW (ref 13.0–17.0)
Immature Granulocytes: 0 %
Lymphocytes Relative: 30 %
Lymphs Abs: 2.4 10*3/uL (ref 0.7–4.0)
MCH: 27.7 pg (ref 26.0–34.0)
MCHC: 30.8 g/dL (ref 30.0–36.0)
MCV: 89.8 fL (ref 80.0–100.0)
Monocytes Absolute: 0.7 10*3/uL (ref 0.1–1.0)
Monocytes Relative: 9 %
NEUTROS PCT: 57 %
Neutro Abs: 4.6 10*3/uL (ref 1.7–7.7)
Platelets: 256 10*3/uL (ref 150–400)
RBC: 4.59 MIL/uL (ref 4.22–5.81)
RDW: 14.1 % (ref 11.5–15.5)
WBC: 8.1 10*3/uL (ref 4.0–10.5)
nRBC: 0 % (ref 0.0–0.2)

## 2018-07-17 LAB — ECHOCARDIOGRAM COMPLETE
Height: 74 in
Weight: 4560 oz

## 2018-07-17 LAB — POCT ACTIVATED CLOTTING TIME
Activated Clotting Time: 263 seconds
Activated Clotting Time: 268 seconds

## 2018-07-17 SURGERY — RIGHT/LEFT HEART CATH AND CORONARY ANGIOGRAPHY
Anesthesia: LOCAL

## 2018-07-17 MED ORDER — SODIUM CHLORIDE 0.9 % WEIGHT BASED INFUSION: 1.0000 mL/kg/h | INTRAVENOUS | Status: DC

## 2018-07-17 MED ORDER — SODIUM CHLORIDE 0.9 % IV SOLN: 250.0000 mL | INTRAVENOUS | Status: DC | PRN

## 2018-07-17 MED ORDER — TICAGRELOR 90 MG PO TABS
ORAL_TABLET | ORAL | Status: DC | PRN
  Administered 2018-07-17: 180 mg via ORAL

## 2018-07-17 MED ORDER — FENTANYL CITRATE (PF) 100 MCG/2ML IJ SOLN
INTRAMUSCULAR | Status: DC | PRN
  Administered 2018-07-17 (×4): 25 ug via INTRAVENOUS

## 2018-07-17 MED ORDER — SODIUM CHLORIDE 0.9% FLUSH: 3.0000 mL | Freq: Two times a day (BID) | INTRAVENOUS | Status: DC

## 2018-07-17 MED ORDER — SODIUM CHLORIDE 0.9 % IV SOLN
INTRAVENOUS | Status: AC
  Administered 2018-07-17: 17:00:00 via INTRAVENOUS

## 2018-07-17 MED ORDER — HYDRALAZINE HCL 20 MG/ML IJ SOLN: 5.0000 mg | INTRAMUSCULAR | Status: AC | PRN

## 2018-07-17 MED ORDER — TICAGRELOR 90 MG PO TABS
90.0000 mg | ORAL_TABLET | Freq: Two times a day (BID) | ORAL | Status: DC
  Administered 2018-07-18 – 2018-07-20 (×5): 90 mg via ORAL
  Filled 2018-07-17 (×6): qty 1

## 2018-07-17 MED ORDER — HEPARIN (PORCINE) IN NACL 1000-0.9 UT/500ML-% IV SOLN
INTRAVENOUS | Status: DC | PRN
  Administered 2018-07-17: 500 mL

## 2018-07-17 MED ORDER — VERAPAMIL HCL 2.5 MG/ML IV SOLN
INTRAVENOUS | Status: DC | PRN
  Administered 2018-07-17: 16:00:00 via INTRA_ARTERIAL

## 2018-07-17 MED ORDER — IOHEXOL 350 MG/ML SOLN
INTRAVENOUS | Status: DC | PRN
  Administered 2018-07-17: 185 mL via INTRA_ARTERIAL

## 2018-07-17 MED ORDER — MIDAZOLAM HCL 2 MG/2ML IJ SOLN
INTRAMUSCULAR | Status: AC
  Filled 2018-07-17: qty 2

## 2018-07-17 MED ORDER — HEPARIN SODIUM (PORCINE) 1000 UNIT/ML IJ SOLN
INTRAMUSCULAR | Status: DC | PRN
  Administered 2018-07-17: 3000 [IU] via INTRAVENOUS
  Administered 2018-07-17: 5000 [IU] via INTRAVENOUS

## 2018-07-17 MED ORDER — LABETALOL HCL 5 MG/ML IV SOLN: 10.0000 mg | INTRAVENOUS | Status: AC | PRN

## 2018-07-17 MED ORDER — SODIUM CHLORIDE 0.9% FLUSH: 3.0000 mL | INTRAVENOUS | Status: DC | PRN

## 2018-07-17 MED ORDER — SODIUM CHLORIDE 0.9% FLUSH
3.0000 mL | Freq: Two times a day (BID) | INTRAVENOUS | Status: DC
  Administered 2018-07-18 – 2018-07-20 (×4): 3 mL via INTRAVENOUS

## 2018-07-17 MED ORDER — HEPARIN (PORCINE) IN NACL 1000-0.9 UT/500ML-% IV SOLN
INTRAVENOUS | Status: AC
  Filled 2018-07-17: qty 1000

## 2018-07-17 MED ORDER — FENTANYL CITRATE (PF) 100 MCG/2ML IJ SOLN
INTRAMUSCULAR | Status: AC
  Filled 2018-07-17: qty 2

## 2018-07-17 MED ORDER — HEPARIN SODIUM (PORCINE) 1000 UNIT/ML IJ SOLN
INTRAMUSCULAR | Status: DC | PRN
  Administered 2018-07-17: 7000 [IU] via INTRAVENOUS

## 2018-07-17 MED ORDER — SODIUM CHLORIDE 0.9 % IV SOLN
INTRAVENOUS | Status: DC | PRN
  Administered 2018-07-17: 10 mL/h via INTRAVENOUS

## 2018-07-17 MED ORDER — VERAPAMIL HCL 2.5 MG/ML IV SOLN
INTRAVENOUS | Status: AC
  Filled 2018-07-17: qty 2

## 2018-07-17 MED ORDER — ENOXAPARIN SODIUM 40 MG/0.4ML ~~LOC~~ SOLN
40.0000 mg | SUBCUTANEOUS | Status: DC
  Administered 2018-07-18 – 2018-07-20 (×3): 40 mg via SUBCUTANEOUS
  Filled 2018-07-17 (×3): qty 0.4

## 2018-07-17 MED ORDER — NITROGLYCERIN 1 MG/10 ML FOR IR/CATH LAB
INTRA_ARTERIAL | Status: AC
  Filled 2018-07-17: qty 10

## 2018-07-17 MED ORDER — NITROGLYCERIN 1 MG/10 ML FOR IR/CATH LAB
INTRA_ARTERIAL | Status: DC | PRN
  Administered 2018-07-17: 200 ug via INTRACORONARY

## 2018-07-17 MED ORDER — ANGIOPLASTY BOOK
Freq: Once | Status: AC
  Administered 2018-07-17: 1
  Filled 2018-07-17: qty 1

## 2018-07-17 MED ORDER — TICAGRELOR 90 MG PO TABS
ORAL_TABLET | ORAL | Status: AC
  Filled 2018-07-17: qty 2

## 2018-07-17 MED ORDER — SODIUM CHLORIDE 0.9 % WEIGHT BASED INFUSION: 3.0000 mL/kg/h | INTRAVENOUS | Status: DC

## 2018-07-17 MED ORDER — MIDAZOLAM HCL 2 MG/2ML IJ SOLN
INTRAMUSCULAR | Status: DC | PRN
  Administered 2018-07-17: 1 mg via INTRAVENOUS

## 2018-07-17 MED ORDER — LIDOCAINE HCL (PF) 1 % IJ SOLN
INTRAMUSCULAR | Status: AC
  Filled 2018-07-17: qty 30

## 2018-07-17 MED ORDER — SODIUM CHLORIDE 0.9 % IV BOLUS
500.0000 mL | Freq: Once | INTRAVENOUS | Status: AC
  Administered 2018-07-17: 500 mL via INTRAVENOUS

## 2018-07-17 MED ORDER — LIDOCAINE HCL (PF) 1 % IJ SOLN
INTRAMUSCULAR | Status: DC | PRN
  Administered 2018-07-17 (×2): 2 mL

## 2018-07-17 MED ORDER — HEPARIN SODIUM (PORCINE) 1000 UNIT/ML IJ SOLN
INTRAMUSCULAR | Status: AC
  Filled 2018-07-17: qty 1

## 2018-07-17 SURGICAL SUPPLY — 20 items
BALLN EMERGE MR 2.25X8 (BALLOONS) ×2
BALLN SAPPHIRE ~~LOC~~ 3.25X8 (BALLOONS) ×1 IMPLANT
BALLOON EMERGE MR 2.25X8 (BALLOONS) IMPLANT
CATH 5FR JL3.5 JR4 ANG PIG MP (CATHETERS) ×1 IMPLANT
CATH BALLN WEDGE 5F 110CM (CATHETERS) ×1 IMPLANT
CATH LAUNCHER 6FR EBU 3 (CATHETERS) ×1 IMPLANT
DEVICE RAD COMP TR BAND LRG (VASCULAR PRODUCTS) ×1 IMPLANT
GLIDESHEATH SLEND SS 6F .021 (SHEATH) ×1 IMPLANT
GUIDEWIRE INQWIRE 1.5J.035X260 (WIRE) IMPLANT
GUIDEWIRE PRESSURE COMET II (WIRE) ×1 IMPLANT
INQWIRE 1.5J .035X260CM (WIRE) ×2
KIT ENCORE 26 ADVANTAGE (KITS) ×1 IMPLANT
KIT ESSENTIALS PG (KITS) ×1 IMPLANT
KIT HEART LEFT (KITS) ×2 IMPLANT
PACK CARDIAC CATHETERIZATION (CUSTOM PROCEDURE TRAY) ×2 IMPLANT
SHEATH GLIDE SLENDER 4/5FR (SHEATH) ×1 IMPLANT
SHEATH PROBE COVER 6X72 (BAG) ×1 IMPLANT
STENT SYNERGY DES 3X12 (Permanent Stent) ×1 IMPLANT
TRANSDUCER W/STOPCOCK (MISCELLANEOUS) ×2 IMPLANT
TUBING CIL FLEX 10 FLL-RA (TUBING) ×2 IMPLANT

## 2018-07-17 NOTE — Progress Notes (Signed)
PROGRESS NOTE    Jerry [name]   WYO:[id number]  DOB: Sep 23, 1950  DOA: 07/15/2018 PCP: Aletha Halim., PA-C   Brief Narrative:  Jerry [name] is a 67 y.o. male with history of CAD status post stenting, chronic back pain, COPD, hyperlipidemia presents to the ER because of increased dyspnea on exertion. He has had this dyspnea for a few months and is has been worse recently with exertion and with laying flat and went down the right arm. He also noted chest pain when he was laying in bed. He was short of breath when this pain occurred.    Subjective: No dyspnea at rest today. Per RN, he is lightheaded when he stands. No cough and no further chest pain. He states his O2 is dropping at night down as low as 86 and he has been needing to be put on O2 in the hospital.     Assessment & Plan:   Principal Problem:   Chest pain- h/o CAD s/p stents has ruled out for MI - cardiology assisting with management-   Active Problems: Dyspnea on exertion- COPD listed in problem list - h/o smoking since age 33- quit 4 yrs ago, has not ever had PFTs - he has gained 20 lbs in recently- high risk for restrictive lung disease based on his body habitus - will need to ambulate and check pulse ox on ambulation- if normal, he needs and outpt work up - ECHO noted below- he was not in acute heart failure on admission per cardiology and I agree  Orthostatic hypotension - currently orthostatic- hold IV Lasix and give a small fluid bolus- will follow closely for resolution   Hypoxia at night - needs a sleep study as outpt ASAP- have discussed this with him   Chronic back pain - cont home Oxycodone  H/o osteosarcoma of rib - s/p R chest wall reconstruction  Morbid obesity - recent 20 lb weight gain Body mass index is 36.57 kg/m.   DVT prophylaxis:Lovenox Code Status: Full code Family Communication:  Disposition Plan: transfer to telemetry Consultants:    cardiology Procedures:  2 D ECHO *Study Conclusions  - Left ventricle: The cavity size was normal. Wall thickness was   increased in a pattern of moderate LVH. Systolic function was   mildly to moderately reduced. The estimated ejection fraction was   in the range of 40% to 45%. Anterior and anteroseptal   hypokinesis. Doppler parameters are consistent with abnormal left   ventricular relaxation (grade 1 diastolic dysfunction). The E/e&'   ratio is between 8-15, suggesting indeterminate LV filling   pressure. - Left atrium: The atrium was normal in size. - Inferior vena cava: The vessel was normal in size. The   respirophasic diameter changes were in the normal range (>= 50%),   consistent with normal central venous pressure.  Impressions:  - Compared to a prior study in 2019, the LVEF is lower at 40-45%    wtih anterior/anteroseptal hypokinesis and grade 1 DD. Antimicrobials:  Anti-infectives (From admission, onward)   None       Objective: Vitals:   07/17/18 0800 07/17/18 1059 07/17/18 1108 07/17/18 1110  BP: 106/80  122/72 140/84  Pulse: 86  86 (!) 103  Resp: 12  13 (!) 24  Temp:  97.8 F (36.6 C)    TempSrc:  Oral    SpO2: 93%  92% 96%  Weight:      Height:  Intake/Output Summary (Last 24 hours) at 07/17/2018 1130 Last data filed at 07/16/2018 2250 Gross per 24 hour  Intake 240 ml  Output 600 ml  Net -360 ml   Filed Weights   07/15/18 2036 07/17/18 0650  Weight: 129.3 kg 129.2 kg    Examination: General exam: Appears comfortable  HEENT: PERRLA, oral mucosa moist, no sclera icterus or thrush Respiratory system: Clear to auscultation. Respiratory effort normal. Cardiovascular system: S1 & S2 heard, RRR.   Gastrointestinal system: Abdomen soft, non-tender, nondistended. Normal bowel sounds. Central nervous system: Alert and oriented. No focal neurological deficits. Extremities: No cyanosis, clubbing or edema Skin: No rashes or  ulcers Psychiatry:  Mood & affect appropriate.     Data Reviewed: I have personally reviewed following labs and imaging studies  CBC: Recent Labs  Lab 07/15/18 2039 07/16/18 0156 07/17/18 0312  WBC 9.5 9.0 8.1  NEUTROABS  --   --  4.6  HGB 13.3 12.3* 12.7*  HCT 43.2 40.5 41.2  MCV 90.0 89.4 89.8  PLT 346 261 944   Basic Metabolic Panel: Recent Labs  Lab 07/15/18 2039 07/16/18 0156 07/16/18 1338 07/17/18 0312  NA 133*  --  136 138  K 3.4*  --  4.1 3.6  CL 85*  --  90* 91*  CO2 32  --  28 33*  GLUCOSE 146*  --  134* 144*  BUN 18  --  13 16  CREATININE 1.29* 1.14 1.07 1.19  CALCIUM 9.2  --  9.3 9.0   GFR: Estimated Creatinine Clearance: 86.1 mL/min (by C-G formula based on SCr of 1.19 mg/dL). Liver Function Tests: No results for input(s): AST, ALT, ALKPHOS, BILITOT, PROT, ALBUMIN in the last 168 hours. No results for input(s): LIPASE, AMYLASE in the last 168 hours. No results for input(s): AMMONIA in the last 168 hours. Coagulation Profile: No results for input(s): INR, PROTIME in the last 168 hours. Cardiac Enzymes: Recent Labs  Lab 07/16/18 0156 07/16/18 0735 07/16/18 1338  TROPONINI <0.03 <0.03 <0.03   BNP (last 3 results) No results for input(s): PROBNP in the last 8760 hours. HbA1C: No results for input(s): HGBA1C in the last 72 hours. CBG: No results for input(s): GLUCAP in the last 168 hours. Lipid Profile: No results for input(s): CHOL, HDL, LDLCALC, TRIG, CHOLHDL, LDLDIRECT in the last 72 hours. Thyroid Function Tests: No results for input(s): TSH, T4TOTAL, FREET4, T3FREE, THYROIDAB in the last 72 hours. Anemia Panel: No results for input(s): VITAMINB12, FOLATE, FERRITIN, TIBC, IRON, RETICCTPCT in the last 72 hours. Urine analysis:    Component Value Date/Time   COLORURINE STRAW (A) 11/15/2017 1120   APPEARANCEUR CLEAR 11/15/2017 1120   LABSPEC 1.002 (L) 11/15/2017 1120   PHURINE 5.0 11/15/2017 1120   GLUCOSEU NEGATIVE 11/15/2017 1120    HGBUR NEGATIVE 11/15/2017 1120   BILIRUBINUR NEGATIVE 11/15/2017 1120   KETONESUR NEGATIVE 11/15/2017 1120   PROTEINUR NEGATIVE 11/15/2017 1120   UROBILINOGEN 1.0 06/10/2015 1106   NITRITE NEGATIVE 11/15/2017 1120   LEUKOCYTESUR NEGATIVE 11/15/2017 1120   Sepsis Labs: @LABRCNTIP (procalcitonin:4,lacticidven:4) )No results found for this or any previous visit (from the past 240 hour(s)).       Radiology Studies: Dg Chest 2 View  Result Date: 07/15/2018 CLINICAL DATA:  Mid to LEFT chest pain radiating to LEFT jaw since this morning. History of RIGHT rib osteosarcoma. EXAM: CHEST - 2 VIEW COMPARISON:  CT chest May 13, 2018 FINDINGS: Cardiac silhouette is mildly enlarged. Mediastinal silhouette is not suspicious. Mildly increased lung volumes with  flattened hemidiaphragms and chronic interstitial changes. Persistently elevated RIGHT hemidiaphragm with RIGHT lung scarring and pleural thickening and plaques. LEFT lung base scarring. No pneumothorax. Osteopenia. Catheter projects in lower lumbar spine, probable epidural catheter. IMPRESSION: Mild cardiomegaly. Chronic interstitial changes and increased lung volume/COPD. Pleural thickening and pleural plaques.  Multifocal scarring. Electronically Signed   By: Elon Alas M.D.   On: 07/15/2018 21:15      Scheduled Meds: . aspirin EC  81 mg Oral Daily  . cholecalciferol  1,000 Units Oral Daily  . enoxaparin (LOVENOX) injection  40 mg Subcutaneous Daily  . escitalopram  20 mg Oral Daily  . ferrous sulfate  325 mg Oral Q breakfast  . multivitamin with minerals  1 tablet Oral Daily  . naloxegol oxalate  25 mg Oral QPC breakfast  . nitroGLYCERIN  0.4 mg Transdermal Daily  . nortriptyline  25 mg Oral QHS  . pantoprazole  40 mg Oral BID  . polyethylene glycol  17 g Oral Daily  . pravastatin  20 mg Oral q1800  . tamsulosin  0.4 mg Oral QPC supper  . traZODone  300 mg Oral QHS  . vitamin C  500 mg Oral Daily   Continuous Infusions: .  sodium chloride 500 mL (07/17/18 1124)     LOS: 1 day    Time spent in minutes: 35    Debbe Odea, MD [facility] Pager: [email or web address] Password [email or web address] 07/17/2018, 11:30 AM

## 2018-07-17 NOTE — Brief Op Note (Signed)
BRIEF CARDIAC CATHETERIZATION NOTE  DATE: 07/17/2018 TIME: 5:06 PM  PATIENT:  Jerry [name]  67 y.o. male  PRE-OPERATIVE DIAGNOSIS:  Unstable angina and acute on chronic diastolic heart failure  POST-OPERATIVE DIAGNOSIS:  Same  PROCEDURE:  Procedure(s): RIGHT/LEFT HEART CATH AND CORONARY ANGIOGRAPHY (N/A)  SURGEON:  Surgeon(s) and Role:    Nelva Bush, MD - Primary  FINDINGS: 1. Significant 2 vessel coronary artery disease involving the LAD and LCx/OM1.  There are sequential 30-70% proximal and mid LAD lesions that are hemodynamically significant (DFR 0.88).  Remote stent extending from the proximal LCx into OM1 has diffuse severe ISR in OM1.  There is 50% stenosis in mid LCx where it is jailed by the stent extending into OM.  This lesion is not significant by DFR. 2. Mild, non-obstructive RCA disease. 3. Normal left and right heart filling pressures as well as Fick cardiac output. 4. Successful PCI to mid LAD using Synergy 3.0 x 12 mm DES post-dilated to 3.3 mm with 0% residual stenosis and TIMI-3 flow.  RECOMMENDATIONS: 1. Aggressive secondary prevention and medical therapy for OM1 ISR.  If patient has continued symptoms despite optimal antianginal therapy, PCI to OM1 could be considered. 2. DAPT with ASA and ticagrelor for at least 12 months.  Nelva Bush, MD [facility] Pager: [phone]

## 2018-07-17 NOTE — H&P (View-Only) (Signed)
The patient has been seen in conjunction with Jerry [name], PAC. All aspects of care have been considered and discussed. The patient has been personally interviewed, examined, and all clinical data has been reviewed.   Significant dyspnea just with walking to the bathroom.  Review of chart reveals severe three-vessel coronary calcification and left main calcification from October.  Cardiac markers are negative.  Echo now demonstrates new mild reduction in LV systolic function with EF 40 to 45%.  Perhaps patient has an anginal equivalent of dyspnea.  Coronary angiography is recommended to exclude severe three-vessel coronary disease/left main disease. The patient was counseled to undergo left heart catheterization, coronary angiography, and possible percutaneous coronary intervention with stent implantation. The procedural risks and benefits were discussed in detail. The risks discussed included death, stroke, myocardial infarction, life-threatening bleeding, limb ischemia, kidney injury, allergy, and possible emergency cardiac surgery. The risk of these significant complications were estimated to occur less than 1% of the time. After discussion, the patient has agreed to proceed.   Progress Note  Patient Name: Jerry [name] Date of Encounter: 07/17/2018  Primary Cardiologist: Jerry Burow, MD   Subjective   No significant overnight events. Patient continues to report mild chest pressure as well as shortness of breath with ambulating. Patient states that he got up and walked to the bathroom this morning and by the time he sat back in bed, he felt like he couldn't breathe.   Inpatient Medications    Scheduled Meds: . aspirin EC  81 mg Oral Daily  . cholecalciferol  1,000 Units Oral Daily  . enoxaparin (LOVENOX) injection  40 mg Subcutaneous Daily  . escitalopram  20 mg Oral Daily  . ferrous sulfate  325 mg Oral Q breakfast  . furosemide  40 mg Intravenous Q12H  .  multivitamin with minerals  1 tablet Oral Daily  . naloxegol oxalate  25 mg Oral QPC breakfast  . nitroGLYCERIN  0.4 mg Transdermal Daily  . nortriptyline  25 mg Oral QHS  . pantoprazole  40 mg Oral BID  . polyethylene glycol  17 g Oral Daily  . pravastatin  20 mg Oral q1800  . tamsulosin  0.4 mg Oral QPC supper  . traZODone  300 mg Oral QHS  . vitamin C  500 mg Oral Daily   Continuous Infusions:  PRN Meds: acetaminophen, albuterol, bisacodyl, calcium carbonate, fentaNYL (SUBLIMAZE) injection, ondansetron (ZOFRAN) IV, oxyCODONE, tiZANidine   Vital Signs    Vitals:   07/17/18 0650 07/17/18 0700 07/17/18 0725 07/17/18 0800  BP:  107/68  106/80  Pulse:  80  86  Resp:  13  12  Temp:   98.6 F (37 C)   TempSrc:   Oral   SpO2:  94%  93%  Weight: 129.2 kg     Height:        Intake/Output Summary (Last 24 hours) at 07/17/2018 1001 Last data filed at 07/16/2018 2250 Gross per 24 hour  Intake 240 ml  Output 600 ml  Net -360 ml   Filed Weights   07/15/18 2036 07/17/18 0650  Weight: 129.3 kg 129.2 kg    Telemetry    Sinus rhythm. - Personally Reviewed  ECG    No new EKG tracings today. - Personally Reviewed  Physical Exam   GEN: Obese Caucasian male resting comfortably. Alert and in no acute distress.   Neck: Supple. Cardiac: RRR. No murmurs, rubs, or gallops.  Respiratory: Clear to auscultation bilaterally. No wheezes, rhonchi, or rales. GI:  Abdomen soft, obese, and non-tender. Bowel sounds present.   MS: No significant lower extremity edema. Patient is currently wearing compression stockings.  Neuro:  No focal deficits. Psych: Normal affect.  Labs    Chemistry Recent Labs  Lab 07/15/18 2039 07/16/18 0156 07/16/18 1338 07/17/18 0312  NA 133*  --  136 138  K 3.4*  --  4.1 3.6  CL 85*  --  90* 91*  CO2 32  --  28 33*  GLUCOSE 146*  --  134* 144*  BUN 18  --  13 16  CREATININE 1.29* 1.14 1.07 1.19  CALCIUM 9.2  --  9.3 9.0  GFRNONAA 57* >60 >60 >60    GFRAA >60 >60 >60 >60  ANIONGAP 16*  --  18* 14     Hematology Recent Labs  Lab 07/15/18 2039 07/16/18 0156 07/17/18 0312  WBC 9.5 9.0 8.1  RBC 4.80 4.53 4.59  HGB 13.3 12.3* 12.7*  HCT 43.2 40.5 41.2  MCV 90.0 89.4 89.8  MCH 27.7 27.2 27.7  MCHC 30.8 30.4 30.8  RDW 13.8 13.9 14.1  PLT 346 261 256    Cardiac Enzymes Recent Labs  Lab 07/16/18 0156 07/16/18 0735 07/16/18 1338  TROPONINI <0.03 <0.03 <0.03    Recent Labs  Lab 07/15/18 2049 07/15/18 2248  TROPIPOC 0.00 0.00     BNP Recent Labs  Lab 07/15/18 2239  BNP 5.9     DDimer  Recent Labs  Lab 07/16/18 0156  DDIMER 0.68*     Radiology    Dg Chest 2 View  Result Date: 07/15/2018 CLINICAL DATA:  Mid to LEFT chest pain radiating to LEFT jaw since this morning. History of RIGHT rib osteosarcoma. EXAM: CHEST - 2 VIEW COMPARISON:  CT chest May 13, 2018 FINDINGS: Cardiac silhouette is mildly enlarged. Mediastinal silhouette is not suspicious. Mildly increased lung volumes with flattened hemidiaphragms and chronic interstitial changes. Persistently elevated RIGHT hemidiaphragm with RIGHT lung scarring and pleural thickening and plaques. LEFT lung base scarring. No pneumothorax. Osteopenia. Catheter projects in lower lumbar spine, probable epidural catheter. IMPRESSION: Mild cardiomegaly. Chronic interstitial changes and increased lung volume/COPD. Pleural thickening and pleural plaques.  Multifocal scarring. Electronically Signed   By: Jerry [name] M.D.   On: 07/15/2018 21:15    Cardiac Studies   Echo pending.  Patient Profile     Mr. Jerry [name] is a 67 y.o. male with a history of CAD with remote stenting in 1997 and most recent cardiac catheterization in 2011 showing mild coronary disease diffusely and a patent stent, chronic diastolic CHF, hypertension, hyperlipidemia, COPD, and chronic back pain who is being seen today for the evaluation of chest pain at the request of Dr. Hal [name].  Assessment &  Plan    Chest Pain Concerning for Unstable Angina - Patient presents with worsening shortness of breath and intermitted chest pain that is worse with exertion. Patient has a history of CAD with most recent cardiac catheterization in 2011 showing non-obstructive CAD with patent stents.  - EKG showed no acute ischemic changes. - Troponin negative x3.  - D-dimer elevated at 0.68. Internal Medicine has ordered a chest CTA to rule out pulmonary embolism. - Patient continues to have mild chest pressure and shortness of breath, especially with ambulating, this morning. - Echo performed yesterday. Results pending. - May consider right/left heart catheterization depending on Echo results.   Chronic Diastolic CHF - Most recent Echo from 08/2017 showed LVEF of 55-60% with grade 1 diastolic dysfunction. - Chest x-ray  showed mild cardiomegaly with chronic changes consistent with COPD. - BNP normal. - Echo pending.  - Patient reports recent weight gain of almost 20 lbs in 10 days going from 279 lbs to 298 lbs. Patient on Lasix 40mg  daily at home. It also sounds like patient received 3 days of Metolazone recently.  - Patient does not appear volume overloaded on exam. - Patient has received two doses of IV Lasix 40mg  since yesterday afternoon and reports no difference in his symptoms since then.  - Continue gentle diuresis.   Acute Kidney Injury - SCr 1.29 on admission. Baseline appears to be around 0.80 to 0.90. - Improving. SCr 1.19 today. - Continue to monitor renal function and electrolytes while being diuresed.   COPD - Per primary team.   For questions or updates, please contact [facility] Please consult [email or web address] for contact info under        Signed, Darreld Mclean, PA-C  07/17/2018, 10:01 AM

## 2018-07-17 NOTE — Interval H&P Note (Signed)
History and Physical Interval Note:  07/17/2018 3:05 PM  [name]  has presented today for cardiac catheterization, with the diagnosis of shortness of breath and unstable angina  The various methods of treatment have been discussed with the patient and family. After consideration of risks, benefits and other options for treatment, the patient has consented to  Procedure(s): RIGHT/LEFT HEART CATH AND CORONARY ANGIOGRAPHY (N/A) as a surgical intervention .  The patient's history has been reviewed, patient examined, no change in status, stable for surgery.  I have reviewed the patient's chart and labs.  Questions were answered to the patient's satisfaction.  Cath Lab Visit (complete for each Cath Lab visit)  Clinical Evaluation Leading to the Procedure:   ACS: Yes.    Non-ACS:  N/A  Jerry [name]

## 2018-07-17 NOTE — Progress Notes (Addendum)
The patient has been seen in conjunction with Sande Rives, PAC. All aspects of care have been considered and discussed. The patient has been personally interviewed, examined, and all clinical data has been reviewed.   Significant dyspnea just with walking to the bathroom.  Review of chart reveals severe three-vessel coronary calcification and left main calcification from October.  Cardiac markers are negative.  Echo now demonstrates new mild reduction in LV systolic function with EF 40 to 45%.  Perhaps patient has an anginal equivalent of dyspnea.  Coronary angiography is recommended to exclude severe three-vessel coronary disease/left main disease. The patient was counseled to undergo left heart catheterization, coronary angiography, and possible percutaneous coronary intervention with stent implantation. The procedural risks and benefits were discussed in detail. The risks discussed included death, stroke, myocardial infarction, life-threatening bleeding, limb ischemia, kidney injury, allergy, and possible emergency cardiac surgery. The risk of these significant complications were estimated to occur less than 1% of the time. After discussion, the patient has agreed to proceed.   Progress Note  Patient Name: Jerry [name] Date of Encounter: 07/17/2018  Primary Cardiologist: Quay Burow, MD   Subjective   No significant overnight events. Patient continues to report mild chest pressure as well as shortness of breath with ambulating. Patient states that he got up and walked to the bathroom this morning and by the time he sat back in bed, he felt like he couldn't breathe.   Inpatient Medications    Scheduled Meds: . aspirin EC  81 mg Oral Daily  . cholecalciferol  1,000 Units Oral Daily  . enoxaparin (LOVENOX) injection  40 mg Subcutaneous Daily  . escitalopram  20 mg Oral Daily  . ferrous sulfate  325 mg Oral Q breakfast  . furosemide  40 mg Intravenous Q12H  .  multivitamin with minerals  1 tablet Oral Daily  . naloxegol oxalate  25 mg Oral QPC breakfast  . nitroGLYCERIN  0.4 mg Transdermal Daily  . nortriptyline  25 mg Oral QHS  . pantoprazole  40 mg Oral BID  . polyethylene glycol  17 g Oral Daily  . pravastatin  20 mg Oral q1800  . tamsulosin  0.4 mg Oral QPC supper  . traZODone  300 mg Oral QHS  . vitamin C  500 mg Oral Daily   Continuous Infusions:  PRN Meds: acetaminophen, albuterol, bisacodyl, calcium carbonate, fentaNYL (SUBLIMAZE) injection, ondansetron (ZOFRAN) IV, oxyCODONE, tiZANidine   Vital Signs    Vitals:   07/17/18 0650 07/17/18 0700 07/17/18 0725 07/17/18 0800  BP:  107/68  106/80  Pulse:  80  86  Resp:  13  12  Temp:   98.6 F (37 C)   TempSrc:   Oral   SpO2:  94%  93%  Weight: 129.2 kg     Height:        Intake/Output Summary (Last 24 hours) at 07/17/2018 1001 Last data filed at 07/16/2018 2250 Gross per 24 hour  Intake 240 ml  Output 600 ml  Net -360 ml   Filed Weights   07/15/18 2036 07/17/18 0650  Weight: 129.3 kg 129.2 kg    Telemetry    Sinus rhythm. - Personally Reviewed  ECG    No new EKG tracings today. - Personally Reviewed  Physical Exam   GEN: Obese Caucasian male resting comfortably. Alert and in no acute distress.   Neck: Supple. Cardiac: RRR. No murmurs, rubs, or gallops.  Respiratory: Clear to auscultation bilaterally. No wheezes, rhonchi, or rales. GI:  Abdomen soft, obese, and non-tender. Bowel sounds present.   MS: No significant lower extremity edema. Patient is currently wearing compression stockings.  Neuro:  No focal deficits. Psych: Normal affect.  Labs    Chemistry Recent Labs  Lab 07/15/18 2039 07/16/18 0156 07/16/18 1338 07/17/18 0312  NA 133*  --  136 138  K 3.4*  --  4.1 3.6  CL 85*  --  90* 91*  CO2 32  --  28 33*  GLUCOSE 146*  --  134* 144*  BUN 18  --  13 16  CREATININE 1.29* 1.14 1.07 1.19  CALCIUM 9.2  --  9.3 9.0  GFRNONAA 57* >60 >60 >60    GFRAA >60 >60 >60 >60  ANIONGAP 16*  --  18* 14     Hematology Recent Labs  Lab 07/15/18 2039 07/16/18 0156 07/17/18 0312  WBC 9.5 9.0 8.1  RBC 4.80 4.53 4.59  HGB 13.3 12.3* 12.7*  HCT 43.2 40.5 41.2  MCV 90.0 89.4 89.8  MCH 27.7 27.2 27.7  MCHC 30.8 30.4 30.8  RDW 13.8 13.9 14.1  PLT 346 261 256    Cardiac Enzymes Recent Labs  Lab 07/16/18 0156 07/16/18 0735 07/16/18 1338  TROPONINI <0.03 <0.03 <0.03    Recent Labs  Lab 07/15/18 2049 07/15/18 2248  TROPIPOC 0.00 0.00     BNP Recent Labs  Lab 07/15/18 2239  BNP 5.9     DDimer  Recent Labs  Lab 07/16/18 0156  DDIMER 0.68*     Radiology    Dg Chest 2 View  Result Date: 07/15/2018 CLINICAL DATA:  Mid to LEFT chest pain radiating to LEFT jaw since this morning. History of RIGHT rib osteosarcoma. EXAM: CHEST - 2 VIEW COMPARISON:  CT chest May 13, 2018 FINDINGS: Cardiac silhouette is mildly enlarged. Mediastinal silhouette is not suspicious. Mildly increased lung volumes with flattened hemidiaphragms and chronic interstitial changes. Persistently elevated RIGHT hemidiaphragm with RIGHT lung scarring and pleural thickening and plaques. LEFT lung base scarring. No pneumothorax. Osteopenia. Catheter projects in lower lumbar spine, probable epidural catheter. IMPRESSION: Mild cardiomegaly. Chronic interstitial changes and increased lung volume/COPD. Pleural thickening and pleural plaques.  Multifocal scarring. Electronically Signed   By: Elon Alas M.D.   On: 07/15/2018 21:15    Cardiac Studies   Echo pending.  Patient Profile     Jerry [name] is a 67 y.o. male with a history of CAD with remote stenting in 1997 and most recent cardiac catheterization in 2011 showing mild coronary disease diffusely and a patent stent, chronic diastolic CHF, hypertension, hyperlipidemia, COPD, and chronic back pain who is being seen today for the evaluation of chest pain at the request of Dr. Hal Hope.  Assessment &  Plan    Chest Pain Concerning for Unstable Angina - Patient presents with worsening shortness of breath and intermitted chest pain that is worse with exertion. Patient has a history of CAD with most recent cardiac catheterization in 2011 showing non-obstructive CAD with patent stents.  - EKG showed no acute ischemic changes. - Troponin negative x3.  - D-dimer elevated at 0.68. Internal Medicine has ordered a chest CTA to rule out pulmonary embolism. - Patient continues to have mild chest pressure and shortness of breath, especially with ambulating, this morning. - Echo performed yesterday. Results pending. - May consider right/left heart catheterization depending on Echo results.   Chronic Diastolic CHF - Most recent Echo from 08/2017 showed LVEF of 55-60% with grade 1 diastolic dysfunction. - Chest x-ray  showed mild cardiomegaly with chronic changes consistent with COPD. - BNP normal. - Echo pending.  - Patient reports recent weight gain of almost 20 lbs in 10 days going from 279 lbs to 298 lbs. Patient on Lasix 40mg  daily at home. It also sounds like patient received 3 days of Metolazone recently.  - Patient does not appear volume overloaded on exam. - Patient has received two doses of IV Lasix 40mg  since yesterday afternoon and reports no difference in his symptoms since then.  - Continue gentle diuresis.   Acute Kidney Injury - SCr 1.29 on admission. Baseline appears to be around 0.80 to 0.90. - Improving. SCr 1.19 today. - Continue to monitor renal function and electrolytes while being diuresed.   COPD - Per primary team.   For questions or updates, please contact [facility] Please consult [email or web address] for contact info under        Signed, Darreld Mclean, PA-C  07/17/2018, 10:01 AM

## 2018-07-18 ENCOUNTER — Encounter (HOSPITAL_COMMUNITY): Payer: Self-pay | Admitting: Internal Medicine

## 2018-07-18 DIAGNOSIS — I5033 Acute on chronic diastolic (congestive) heart failure: Secondary | ICD-10-CM

## 2018-07-18 DIAGNOSIS — R079 Chest pain, unspecified: Secondary | ICD-10-CM

## 2018-07-18 LAB — CBC
HCT: 37.3 % — ABNORMAL LOW (ref 39.0–52.0)
HEMOGLOBIN: 11.5 g/dL — AB (ref 13.0–17.0)
MCH: 28 pg (ref 26.0–34.0)
MCHC: 30.8 g/dL (ref 30.0–36.0)
MCV: 90.8 fL (ref 80.0–100.0)
Platelets: 221 10*3/uL (ref 150–400)
RBC: 4.11 MIL/uL — ABNORMAL LOW (ref 4.22–5.81)
RDW: 14 % (ref 11.5–15.5)
WBC: 7.6 10*3/uL (ref 4.0–10.5)
nRBC: 0 % (ref 0.0–0.2)

## 2018-07-18 LAB — BASIC METABOLIC PANEL
Anion gap: 13 (ref 5–15)
BUN: 12 mg/dL (ref 8–23)
CO2: 31 mmol/L (ref 22–32)
Calcium: 8.8 mg/dL — ABNORMAL LOW (ref 8.9–10.3)
Chloride: 93 mmol/L — ABNORMAL LOW (ref 98–111)
Creatinine, Ser: 1.13 mg/dL (ref 0.61–1.24)
GFR calc Af Amer: >60 mL/min (ref >60)
GFR calc non Af Amer: >60 mL/min (ref >60)
Glucose, Bld: 124 mg/dL — ABNORMAL HIGH (ref 70–99)
Potassium: 3.7 mmol/L (ref 3.5–5.1)
SODIUM: 137 mmol/L (ref 135–145)

## 2018-07-18 MED ORDER — LISINOPRIL 5 MG PO TABS: 5.0000 mg | ORAL_TABLET | Freq: Every day | ORAL | Status: DC

## 2018-07-18 MED ORDER — LOSARTAN POTASSIUM 25 MG PO TABS
25.0000 mg | ORAL_TABLET | Freq: Every day | ORAL | Status: DC
  Administered 2018-07-18 – 2018-07-20 (×3): 25 mg via ORAL
  Filled 2018-07-18 (×3): qty 1

## 2018-07-18 MED ORDER — METOPROLOL TARTRATE 12.5 MG HALF TABLET
12.5000 mg | ORAL_TABLET | Freq: Two times a day (BID) | ORAL | Status: DC
  Administered 2018-07-18 – 2018-07-20 (×5): 12.5 mg via ORAL
  Filled 2018-07-18 (×5): qty 1

## 2018-07-18 MED ORDER — ATORVASTATIN CALCIUM 80 MG PO TABS
80.0000 mg | ORAL_TABLET | Freq: Every day | ORAL | Status: DC
  Administered 2018-07-18 – 2018-07-19 (×2): 80 mg via ORAL
  Filled 2018-07-18 (×2): qty 1

## 2018-07-18 NOTE — Care Management Note (Addendum)
Case Management Note  Patient Details  Name: Jerry [name] MRN: [id number] Date of Birth: 06-05-1951  Subjective/Objective:   From home, pta indep with cane, s/p stent intervention, will be on brilinta, awaiting benefit check.  RN will give patient the 30 day savings coupon. His phone is [phone]. NCM informed patient of benefit check , he states he should be out of the gap in Jan, so he will use the 30 day free and then he will be ok for Jan because it will start over, so his copay should be cheaper.                   Action/Plan: DC home when medically ready.   Expected Discharge Date:  07/18/18               Expected Discharge Plan:  Home/Self Care(Independent from home, has cane when needed, pt has PCP and denied barriers with paying for medications)  In-House Referral:     Discharge planning Services  CM Consult, Medication Assistance  Post Acute Care Choice:    Choice offered to:     DME Arranged:    DME Agency:     HH Arranged:    HH Agency:     Status of Service:  Completed, signed off  If discussed at [employer], dates discussed:    Additional Comments:  Zenon Mayo, RN 07/18/2018, 9:10 AM

## 2018-07-18 NOTE — Discharge Instructions (Addendum)
Check you BP twice a day and keep a record of it. Check your weight daily and keep a record of this as well. Take this measurements to your next cardiology visit.  You were cared for by a hospitalist during your hospital stay. If you have any questions about your discharge medications or the care you received while you were in the hospital after you are discharged, you can call the unit and asked to speak with the hospitalist on call if the hospitalist that took care of you is not available. Once you are discharged, your primary care physician will handle any further medical issues.   Please note that NO REFILLS for any discharge medications will be authorized once you are discharged, as it is imperative that you return to your primary care physician (or establish a relationship with a primary care physician if you do not have one) for your aftercare needs so that they can reassess your need for medications and monitor your lab values.  Please take all your medications with you for your next visit with your Primary MD. Please ask your Primary MD to get all Hospital records sent to his/her office. Please request your Primary MD to go over all hospital test results at the follow up.   If you experience worsening of your admission symptoms, develop shortness of breath, chest pain, suicidal or homicidal thoughts or a life threatening emergency, you must seek medical attention immediately by calling 911 or calling your MD.   Dennis Bast must read the complete instructions/literature along with all the possible adverse reactions/side effects for all the medicines you take including new medications that have been prescribed to you. Take new medicines after you have completely understood and accpet all the possible adverse reactions/side effects.    Do not drive when taking pain medications or sedatives.     Do not take more than prescribed Pain, Sleep and Anxiety Medications   If you have smoked or chewed Tobacco  in the last 2 yrs please stop. Stop any regular alcohol  and or recreational drug use.   Wear Seat belts while driving.   Radial Site Care Refer to this sheet in the next few weeks. These instructions provide you with information on caring for yourself after your procedure. Your caregiver may also give you more specific instructions. Your treatment has been planned according to current medical practices, but problems sometimes occur. Call your caregiver if you have any problems or questions after your procedure. HOME CARE INSTRUCTIONS  You may shower the day after the procedure.Remove the bandage (dressing) and gently wash the site with plain soap and water.Gently pat the site dry.   Do not apply powder or lotion to the site.   Do not submerge the affected site in water for 3 to 5 days.   Inspect the site at least twice daily.   Do not flex or bend the affected arm for 24 hours.   No lifting over 5 pounds (2.3 kg) for 5 days after your procedure.   Do not drive home if you are discharged the same day of the procedure. Have someone else drive you.   You may drive 24 hours after the procedure unless otherwise instructed by your caregiver.  What to expect:  Any bruising will usually fade within 1 to 2 weeks.   Blood that collects in the tissue (hematoma) may be painful to the touch. It should usually decrease in size and tenderness within 1 to 2 weeks.  SEEK IMMEDIATE  MEDICAL CARE IF:  You have unusual pain at the radial site.   You have redness, warmth, swelling, or pain at the radial site.   You have drainage (other than a small amount of blood on the dressing).   You have chills.   You have a fever or persistent symptoms for more than 72 hours.   You have a fever and your symptoms suddenly get worse.   Your arm becomes pale, cool, tingly, or numb.   You have heavy bleeding from the site. Hold pressure on the site.    Information about your medication: Brilinta  (anti-platelet agent)  Generic Name (Brand): ticagrelor (Brilinta), twice daily medication  PURPOSE: You are taking this medication along with aspirin to lower your chance of having a heart attack, stroke, or blood clots in your heart stent. These can be fatal. Brilinta and aspirin help prevent platelets from sticking together and forming a clot that can block an artery or your stent.   Common SIDE EFFECTS you may experience include: bruising or bleeding more easily, shortness of breath  Do not stop taking BRILINTA without talking to the doctor who prescribes it for you. People who are treated with a stent and stop taking Brilinta too soon, have a higher risk of getting a blood clot in the stent, having a heart attack, or dying. If you stop Brilinta because of bleeding, or for other reasons, your risk of a heart attack or stroke may increase.   Tell all of your doctors and dentists that you are taking Brilinta. They should talk to the doctor who prescribed Brilinta for you before you have any surgery or invasive procedure.   Contact your health care provider if you experience: severe or uncontrollable bleeding, pink/red/brown urine, vomiting blood or vomit that looks like "coffee grounds", red or black stools (looks like tar), coughing up blood or blood clots --------------------------------------------------------------------------------------------------------  Dr Acie Fredrickson (the cardiologist) would like you try this for your lower extremity swelling Go to [email or web address]

## 2018-07-18 NOTE — Progress Notes (Signed)
PROGRESS NOTE    Jerry [name]   ZOX:[id number]  DOB: 1950/12/13  DOA: 07/15/2018 PCP: Aletha Halim., PA-C   Brief Narrative:  Jerry [name] is a 67 y.o. male with history of CAD status post stenting, chronic back pain, COPD, hyperlipidemia presents to the ER because of increased dyspnea on exertion. He has had this dyspnea for a few months and is has been worse recently with exertion and with laying flat and went down the right arm. He also noted chest pain when he was laying in bed. He was short of breath when this pain occurred.    Subjective: Continue to state he has dyspnea at rest. Pulse ox in 90s. Does not appear short of breath. No chest pain. No other complaints.     Assessment & Plan:   Principal Problem:   Chest pain- h/o CAD s/p stents - has ruled out for MI - cardiology assisting with management- received a stent to the LAD and recommended to have medical therapy for obtuse marginal - on DAPT with ASA and Tricagrelor for 12 months minimum - check Cr again tomorrow  (post cath) -   Active Problems: Chronic systolic CHF - mild drop in EF- see ECHO below-  - no acute exacerbation during this admission- euvolemic and thus Lasix on hold  Dyspnea on exertion- COPD listed in problem list - h/o smoking since age 54- quit 4 yrs ago, has not ever had PFTs - he has gained 20 lbs in recently- high risk for restrictive lung disease based on his body habitus - ECHO noted below- he was not in acute heart failure on admission per cardiology and I agree - ambulatory pulse ox today is about 95%- he is not wheezing and thus not having a COPD exacerbation - will obtain a VQ scan to look for PE- (No CTA as he just received contrast for cath yesterday)- if negative, remainder of work up can be done as outpt by pulmonary - will ask for pulm eval for outpt f/u and setting up a sleep study   Orthostatic hypotension - currently orthostatic- held IV Lasix and  given a small fluid bolus-     Hypoxia at night - needs a sleep study as outpt ASAP- have discussed this with him   Chronic back pain - cont home Oxycodone  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   DVT prophylaxis:Lovenox Code Status: Full code Family Communication:  Disposition Plan: transfer to telemetry Consultants:   cardiology Procedures:  2 D ECHO *Study Conclusions  - Left ventricle: The cavity size was normal. Wall thickness was   increased in a pattern of moderate LVH. Systolic function was   mildly to moderately reduced. The estimated ejection fraction was   in the range of 40% to 45%. Anterior and anteroseptal   hypokinesis. Doppler parameters are consistent with abnormal left   ventricular relaxation (grade 1 diastolic dysfunction). The E/e&'   ratio is between 8-15, suggesting indeterminate LV filling   pressure. - Left atrium: The atrium was normal in size. - Inferior vena cava: The vessel was normal in size. The   respirophasic diameter changes were in the normal range (>= 50%),   consistent with normal central venous pressure.  Impressions:  - Compared to a prior study in 2019, the LVEF is lower at 40-45%    wtih anterior/anteroseptal hypokinesis and  grade 1 DD. Antimicrobials:  Anti-infectives (From admission, onward)   None       Objective: Vitals:   07/18/18 0524 07/18/18 0750 07/18/18 1127 07/18/18 1133  BP: (!) 127/59 125/68 (!) 144/67 118/77  Pulse: 69 77 79   Resp: 11 12    Temp: 97.7 F (36.5 C) 98 F (36.7 C)  (!) 97.5 F (36.4 C)  TempSrc: Oral Axillary  Oral  SpO2: 95% 95%    Weight: 130.3 kg     Height:        Intake/Output Summary (Last 24 hours) at 07/18/2018 1230 Last data filed at 07/18/2018 0912 Gross per 24 hour  Intake 1539.88 ml  Output 600 ml  Net 939.88 ml   Filed Weights   07/15/18 2036 07/17/18 0650  07/18/18 0524  Weight: 129.3 kg 129.2 kg 130.3 kg    Examination: General exam: Appears comfortable  HEENT: PERRLA, oral mucosa moist, no sclera icterus or thrush Respiratory system: Clear to auscultation. Respiratory effort normal. Cardiovascular system: S1 & S2 heard,  No murmurs  Gastrointestinal system: Abdomen soft, non-tender, nondistended. Normal bowel sound. No organomegaly Central nervous system: Alert and oriented. No focal neurological deficits. Extremities: No cyanosis, clubbing or edema Skin: No rashes or ulcers Psychiatry:  Mood & affect appropriate.     Data Reviewed: I have personally reviewed following labs and imaging studies  CBC: Recent Labs  Lab 07/15/18 2039 07/16/18 0156 07/17/18 0312 07/18/18 0504  WBC 9.5 9.0 8.1 7.6  NEUTROABS  --   --  4.6  --   HGB 13.3 12.3* 12.7* 11.5*  HCT 43.2 40.5 41.2 37.3*  MCV 90.0 89.4 89.8 90.8  PLT 346 261 256 782   Basic Metabolic Panel: Recent Labs  Lab 07/15/18 2039 07/16/18 0156 07/16/18 1338 07/17/18 0312 07/18/18 0504  NA 133*  --  136 138 137  K 3.4*  --  4.1 3.6 3.7  CL 85*  --  90* 91* 93*  CO2 32  --  28 33* 31  GLUCOSE 146*  --  134* 144* 124*  BUN 18  --  13 16 12   CREATININE 1.29* 1.14 1.07 1.19 1.13  CALCIUM 9.2  --  9.3 9.0 8.8*   GFR: Estimated Creatinine Clearance: 91 mL/min (by C-G formula based on SCr of 1.13 mg/dL). Liver Function Tests: No results for input(s): AST, ALT, ALKPHOS, BILITOT, PROT, ALBUMIN in the last 168 hours. No results for input(s): LIPASE, AMYLASE in the last 168 hours. No results for input(s): AMMONIA in the last 168 hours. Coagulation Profile: Recent Labs  Lab 07/17/18 1926  INR 1.03   Cardiac Enzymes: Recent Labs  Lab 07/16/18 0156 07/16/18 0735 07/16/18 1338  TROPONINI <0.03 <0.03 <0.03   BNP (last 3 results) No results for input(s): PROBNP in the last 8760 hours. HbA1C: No results for input(s): HGBA1C in the last 72 hours. CBG: No results for  input(s): GLUCAP in the last 168 hours. Lipid Profile: No results for input(s): CHOL, HDL, LDLCALC, TRIG, CHOLHDL, LDLDIRECT in the last 72 hours. Thyroid Function Tests: No results for input(s): TSH, T4TOTAL, FREET4, T3FREE, THYROIDAB in the last 72 hours. Anemia Panel: No results for input(s): VITAMINB12, FOLATE, FERRITIN, TIBC, IRON, RETICCTPCT in the last 72 hours. Urine analysis:    Component Value Date/Time   COLORURINE STRAW (A) 11/15/2017 1120   APPEARANCEUR CLEAR 11/15/2017 1120   LABSPEC 1.002 (L) 11/15/2017 1120   PHURINE 5.0 11/15/2017 1120   GLUCOSEU NEGATIVE 11/15/2017 1120   HGBUR NEGATIVE  11/15/2017 [place] 11/15/2017 1120   KETONESUR NEGATIVE 11/15/2017 1120   PROTEINUR NEGATIVE 11/15/2017 1120   UROBILINOGEN 1.0 06/10/2015 1106   NITRITE NEGATIVE 11/15/2017 1120   LEUKOCYTESUR NEGATIVE 11/15/2017 1120   Sepsis Labs: @LABRCNTIP (procalcitonin:4,lacticidven:4) )No results found for this or any previous visit (from the past 240 hour(s)).       Radiology Studies: No results found.    Scheduled Meds: . aspirin EC  81 mg Oral Daily  . atorvastatin  80 mg Oral q1800  . cholecalciferol  1,000 Units Oral Daily  . enoxaparin (LOVENOX) injection  40 mg Subcutaneous Q24H  . escitalopram  20 mg Oral Daily  . ferrous sulfate  325 mg Oral Q breakfast  . losartan  25 mg Oral Daily  . metoprolol tartrate  12.5 mg Oral BID  . multivitamin with minerals  1 tablet Oral Daily  . naloxegol oxalate  25 mg Oral QPC breakfast  . nitroGLYCERIN  0.4 mg Transdermal Daily  . nortriptyline  25 mg Oral QHS  . pantoprazole  40 mg Oral BID  . polyethylene glycol  17 g Oral Daily  . sodium chloride flush  3 mL Intravenous Q12H  . tamsulosin  0.4 mg Oral QPC supper  . ticagrelor  90 mg Oral BID  . traZODone  300 mg Oral QHS  . vitamin C  500 mg Oral Daily   Continuous Infusions: . sodium chloride       LOS: 2 days    Time spent in minutes:  35    Debbe Odea, MD [facility] Pager: [email or web address] Password [email or web address] 07/18/2018, 12:30 PM

## 2018-07-18 NOTE — Care Management (Signed)
#     2.   S/W KELLY  @ [employer] # [phone]   BRILINTA  90 MG BID COVER- YES CO-PAY -  $ 93.03  25 % OF TOTAL COST TIER- 3 DRUG PRIOR APPROVAL- NO NO DEDUCTIBLE STAGE THREE COVERAGE GAP  PREFERRED PHARMACY : YES - [facility]

## 2018-07-18 NOTE — Progress Notes (Signed)
CARDIAC REHAB PHASE I   PRE:  Rate/Rhythm: 79 SR    BP: sitting 115/64    SaO2: 92 RA  MODE:  Ambulation: 120 ft   POST:  Rate/Rhythm: 122 ST    BP: sitting 144/67     SaO2: 93 RA  Pt sts he walked earlier and was SOB, esp after sitting. He sts he had chest stabbing with radiation earlier at rest. He ambulated with assist x1 with gait belt and his cane (his ambulation is limited due to orthopedic injury). After 60 ft pt c/o chest stabbing and SOB again. Sts it radiated, like a squeezing feeling. Lasted a couple minutes and resolved with rest. He was able to walk back to room. SOB but no more CP. HR elevated. Sts his SOB is some better since cath but still present.   Discussed stent, Brilinta/ASA, restrictions, diet, walking as tolerated, watching for fluid, NTG, and CRPII. He is eager to try CRPII (can do sitting machines). Will refer to [place] but he will have to work out transportation. Will f/u. [phone]   [place], ACSM 07/18/2018 10:12 AM

## 2018-07-18 NOTE — Progress Notes (Signed)
TR BAND REMOVAL  LOCATION:    right radial  DEFLATED PER PROTOCOL:    Yes.    TIME BAND OFF / DRESSING APPLIED:    21:30   SITE UPON ARRIVAL:    Level 0  SITE AFTER BAND REMOVAL:    Level 0  CIRCULATION SENSATION AND MOVEMENT:    Within Normal Limits   Yes.    COMMENTS:   Post TR band instructions given. Pt tolerated well. 

## 2018-07-18 NOTE — Care Management Important Message (Signed)
Important Message  Patient Details  Name: Jerry [name] MRN: [id number] Date of Birth: 02/19/1951   Medicare Important Message Given:  Yes    Flemon Kelty P Johnthan Axtman 07/18/2018, 1:00 PM

## 2018-07-18 NOTE — Progress Notes (Addendum)
The patient has been seen in conjunction with Sande Rives, PAC. All aspects of care have been considered and discussed. The patient has been personally interviewed, examined, and all clinical data has been reviewed.   Cath yesterday did not demonstrate evidence of heart failure to explain dyspnea.  Mean pulmonary capillary wedge pressure was 8 mmHg and left ventricular end-diastolic pressure is less than 10.  Was identified to have hemodynamically significant moderately severe LAD disease.  The first obtuse marginal which is relatively small contains severe diffuse in-stent restenosis.  LAD was stented.  Obtuse marginal recommended as medical therapy (I agree with this decision given the small size of the vessel).  We will see if he improves now the LAD is stented.  He is certainly not in heart failure.  Dyspnea, if it continues needs to be investigated from the standpoint of pulmonary disease/PE/etc.  Plan ambulation.   Patient Name: Jerry [name] Date of Encounter: 07/18/2018  Primary Cardiologist: Quay Burow, MD   Subjective   No significant overnight events. Patient looks like he is feeling better today. He reports the chest pressure has completely resolved since the heart catheterization yesterday. However, he continues to have shortness of breath.   Inpatient Medications    Scheduled Meds: . aspirin EC  81 mg Oral Daily  . cholecalciferol  1,000 Units Oral Daily  . enoxaparin (LOVENOX) injection  40 mg Subcutaneous Q24H  . escitalopram  20 mg Oral Daily  . ferrous sulfate  325 mg Oral Q breakfast  . multivitamin with minerals  1 tablet Oral Daily  . naloxegol oxalate  25 mg Oral QPC breakfast  . nitroGLYCERIN  0.4 mg Transdermal Daily  . nortriptyline  25 mg Oral QHS  . pantoprazole  40 mg Oral BID  . polyethylene glycol  17 g Oral Daily  . pravastatin  20 mg Oral q1800  . sodium chloride flush  3 mL Intravenous Q12H  . tamsulosin  0.4 mg Oral QPC supper  .  ticagrelor  90 mg Oral BID  . traZODone  300 mg Oral QHS  . vitamin C  500 mg Oral Daily   Continuous Infusions: . sodium chloride     PRN Meds: sodium chloride, acetaminophen, albuterol, bisacodyl, calcium carbonate, fentaNYL (SUBLIMAZE) injection, ondansetron (ZOFRAN) IV, oxyCODONE, sodium chloride flush, tiZANidine   Vital Signs    Vitals:   07/17/18 2023 07/18/18 0100 07/18/18 0524 07/18/18 0750  BP: 136/66  (!) 127/59 125/68  Pulse: 78 68 69 77  Resp: 17 13 11 12   Temp: 98 F (36.7 C)  97.7 F (36.5 C) 98 F (36.7 C)  TempSrc: Oral  Oral Axillary  SpO2: 94% 94% 95% 95%  Weight:   130.3 kg   Height:        Intake/Output Summary (Last 24 hours) at 07/18/2018 0758 Last data filed at 07/18/2018 0100 Gross per 24 hour  Intake 1299.88 ml  Output 600 ml  Net 699.88 ml   Filed Weights   07/15/18 2036 07/17/18 0650 07/18/18 0524  Weight: 129.3 kg 129.2 kg 130.3 kg    Telemetry    Sinus rhythm. - Personally Reviewed  ECG    Normal sinus rhythm, rate 70 bpm, with nonspecific ST/T changes. - Personally Reviewed  Physical Exam   GEN: Obese Caucasian male resting comfortably. Alert and in no acute distress.   Neck: Supple. Cardiac: RRR. No murmurs, rubs, or gallops. Right radial cath site soft with no signs of hematoma. Respiratory: Clear to auscultation  bilaterally. No wheezes, rhonchi, or rales. GI: Abdomen soft, obese, and non-tender. Bowel sounds present.   MS: No significant lower extremity edema. Patient is currently wearing compression stockings.  Neuro:  No focal deficits. Psych: Normal affect.  Labs    Chemistry Recent Labs  Lab 07/16/18 1338 07/17/18 0312 07/18/18 0504  NA 136 138 137  K 4.1 3.6 3.7  CL 90* 91* 93*  CO2 28 33* 31  GLUCOSE 134* 144* 124*  BUN 13 16 12   CREATININE 1.07 1.19 1.13  CALCIUM 9.3 9.0 8.8*  GFRNONAA >60 >60 >60  GFRAA >60 >60 >60  ANIONGAP 18* 14 13     Hematology Recent Labs  Lab 07/16/18 0156 07/17/18 0312  07/18/18 0504  WBC 9.0 8.1 7.6  RBC 4.53 4.59 4.11*  HGB 12.3* 12.7* 11.5*  HCT 40.5 41.2 37.3*  MCV 89.4 89.8 90.8  MCH 27.2 27.7 28.0  MCHC 30.4 30.8 30.8  RDW 13.9 14.1 14.0  PLT 261 256 221    Cardiac Enzymes Recent Labs  Lab 07/16/18 0156 07/16/18 0735 07/16/18 1338  TROPONINI <0.03 <0.03 <0.03    Recent Labs  Lab 07/15/18 2049 07/15/18 2248  TROPIPOC 0.00 0.00     BNP Recent Labs  Lab 07/15/18 2239  BNP 5.9     DDimer  Recent Labs  Lab 07/16/18 0156  DDIMER 0.68*     Radiology    No results found.  Cardiac Studies   Echocardiogram 07/16/2018: Study Conclusions: - Left ventricle: The cavity size was normal. Wall thickness was   increased in a pattern of moderate LVH. Systolic function was   mildly to moderately reduced. The estimated ejection fraction was   in the range of 40% to 45%. Anterior and anteroseptal   hypokinesis. Doppler parameters are consistent with abnormal left   ventricular relaxation (grade 1 diastolic dysfunction). The E/e&'   ratio is between 8-15, suggesting indeterminate LV filling   pressure. - Left atrium: The atrium was normal in size. - Inferior vena cava: The vessel was normal in size. The   respirophasic diameter changes were in the normal range (>= 50%),   consistent with normal central venous pressure.  Impressions: - Compared to a prior study in 2019, the LVEF is lower at 40-45%   wtih anterior/anteroseptal hypokinesis and grade 1 DD. _______________  Right/ Left Heart Catheterization 07/17/2018: Conclusions: 1. Significant two-vessel coronary artery disease involving the LAD and LCx/OM1. There are sequential 20-70% proximal and mid LAD lesions that are hemodynamically significant (DFR 0.88). Remote stent extending from the proximal LCx into OM1 has diffuse severe ISR in OM1. There is 50% stenosis in mid LCx where it is jailed by the stent extending into OM. This lesion is not significant by DFR (DFR  0.95). 2. Mild, non-obstructive RCA disease. 3. Normal left and right heart filling pressures as well as Fick cardiac output. 4. Successful PCI to mid LAD using Synergy 3.0 x 12 mm DES postdilated to 3.3 mm with 0% residual stenosis and TIMI-3 flow.  Recommendations: 5. Aggressive secondary prevention and medical therapy for OM1 in-stent restenosis. If the patient has continued symptoms despite optimal antianginal therapy, PCI to OM1 could be considered. 6. Dual antiplatelet therapy with aspirin 81 mg daily and ticagrelor 90 mg twice daily for at least 12 months.  Patient Profile     Jerry [name] is a 68 y.o. male with a history of CAD with remote stenting in 1997 and most recent cardiac catheterization in 2011 showing mild coronary  disease diffusely and a patent stent, chronic diastolic CHF, hypertension, hyperlipidemia, COPD, and chronic back pain who is being seen today for the evaluation of chest pain at the request of Dr. Hal Hope.  Assessment & Plan    Chest Pain Concerning for Unstable Angina - Patient presents with worsening shortness of breath and intermitted chest pain that is worse with exertion. Patient has a history of CAD with most recent cardiac catheterization in 2011 showing non-obstructive CAD with patent stents.  - EKG showed no acute ischemic changes. - Troponin negative x3.  - D-dimer elevated at 0.68. Chest CTA to rule out pulmonary embolism was initially planned but patient was unable to lay flat due to chronic back pain. Will defer to MD. - Echocardiogram showed LVEF 40-45% (down from 55-60% in 08/2017) with anterior/anteroseptal hypokinesis and grade 1 diastolic dysfunction.  - Right/left heart catheterization showed significant 2-vessel CAD involving the LAD and left circumflex/OM1 with normal left and right heart filling pressures. Patient underwent successful PCI with DES to mid LAD. - Patient reports chest pressure has completely resolved since cath. However,  patient continues to report some shortness of breath. - Continue dual antiplatelet therapy with Aspirin 81mg  daily and Brilinta 90mg  twice daily. - Currently on Lovastatin at home. Will switch to high-intensity statin.  - Patient on Losartan 100mg  daily at home but that was not started on admission. Will add Losartan 25mg  today to see how blood pressure tolerates it with plans to return to home dose at discharge. - Will add low dose Lopressor 12.5mg  twice daily.  Chronic Diastolic CHF - Chest x-ray showed mild cardiomegaly with chronic changes consistent with COPD. - BNP normal. - Echo as above. - Patient reports recent weight gain of almost 20 lbs in 10 days going from 279 lbs to 298 lbs. Patient on Lasix 40mg  daily at home. It also sounds like patient received 3 days of Metolazone recently.  - Patient does not appear volume overloaded on exam. - Lasix was discontinued yesterday as patient was orthostatic.  - Will add beta-blocker and ARB as above.  Acute Kidney Injury - SCr 1.29 on admission. Baseline appears to be around 0.80 to 0.90. - Improving. SCr 1.13 today. - Continue to monitor renal function and electrolytes.  COPD - Per primary team.   For questions or updates, please contact [facility] Please consult [email or web address] for contact info under        Signed, Darreld Mclean, PA-C  07/18/2018, 7:58 AM

## 2018-07-19 DIAGNOSIS — C413 Malignant neoplasm of ribs, sternum and clavicle: Secondary | ICD-10-CM

## 2018-07-19 DIAGNOSIS — I10 Essential (primary) hypertension: Secondary | ICD-10-CM

## 2018-07-19 DIAGNOSIS — I959 Hypotension, unspecified: Secondary | ICD-10-CM

## 2018-07-19 MED ORDER — METOPROLOL TARTRATE 25 MG PO TABS: 12.5000 mg | ORAL_TABLET | Freq: Two times a day (BID) | ORAL | 0 refills | Status: DC

## 2018-07-19 MED ORDER — TICAGRELOR 90 MG PO TABS: 90.0000 mg | ORAL_TABLET | Freq: Two times a day (BID) | ORAL | 0 refills | Status: DC

## 2018-07-19 MED ORDER — LOSARTAN POTASSIUM 25 MG PO TABS: 25.0000 mg | ORAL_TABLET | Freq: Every day | ORAL | 0 refills | Status: DC

## 2018-07-19 MED ORDER — ATORVASTATIN CALCIUM 80 MG PO TABS: 80.0000 mg | ORAL_TABLET | Freq: Every day | ORAL | 0 refills | Status: DC

## 2018-07-19 NOTE — Progress Notes (Signed)
Pt has a morphine mump in his rt side of lower abdomen for his chronic back pain problem. Pt said it is there since 17 years off and on  [place] Teters, [place]

## 2018-07-19 NOTE — Progress Notes (Signed)
CARDIAC REHAB PHASE I   PRE:  Rate/Rhythm: 78 SR    BP: lying 105/57, standing 83/52, retake after 2 mjn 117/74    SaO2: 95 RA  MODE:  Ambulation: 180 ft   POST:  Rate/Rhythm: 106 ST    BP: sitting 145/75     SaO2: 93 RA  Pt c/o hypotension last night, worried this am. Sts he is a little lightheaded. His BP did drop with standing however I had him stand a couple more minutes and BP came up. Able to walk with his cane. Rest after 90 ft against wall then to recliner in room. Sts his SOB might be slightly better today. No CP. Gave pt low sodium diets and discussed.  [place], ACSM 07/19/2018 8:36 AM

## 2018-07-19 NOTE — Progress Notes (Addendum)
PROGRESS NOTE    Jerry [name]   UQJ:[id number]  DOB: 1951/07/29  DOA: 07/15/2018 PCP: Aletha Halim., PA-C   Brief Narrative:  Jerry [name] is a 67 y.o. male with history of CAD status post stenting, chronic back pain, COPD, hyperlipidemia presents to the ER because of increased dyspnea on exertion. He has had this dyspnea for a few months and is has been worse recently with exertion and with laying flat and went down the right arm. He also noted chest pain when he was laying in bed. He was short of breath when this pain occurred.    Subjective: States he has dyspnea at rest. When I ask what he means by this he states he feels like he is only taking half a breath although he appears to be breathing normally. Does not appear short of breath at rest. Ambulated yesterday and has a good pulse ox (95% on exertion). No wheezing and no coughing.  No chest pain.  Scare to go home today because his BP was a little low last night.    Assessment & Plan:   Principal Problem:   Chest pain- h/o CAD s/p stents - has ruled out for MI - cardiology assisting with management- received a stent to the LAD and recommended to have medical therapy for obtuse marginal - on DAPT with ASA and Tricagrelor for 12 months minimum - became hypotensive overnight- cardiology monitoring- medication changes to be made by them    Active Problems: Chronic systolic CHF - mild drop in EF- see ECHO below-  - no acute exacerbation during this admission- euvolemic and thus Lasix on hold- cardiology in agreement with this  Hypotension overnight- BP dropping to 70s -  monitor patient again today overnight  Dyspnea on exertion- COPD listed in problem list - h/o smoking since age 9- quit 4 yrs ago, he states has not ever had PFTs - he has gained 20 lbs in recently and is a high risk for restrictive lung disease based on his body habitus - d dimer only mildly elevated at 0.65- has chronic pedal  edema no worse from baseline- attempted to obtain a VQ scan and CTA but he is unable to lay on his back and asks for sedation- I do not think sedation for these tests is a good option - at this time my pre- test probability for PE is very low - ECHO noted below- he was not in acute heart failure on admission per cardiology and I agree - he is desaturating at night and needs a sleep study - I have discussed with the cardiology APP who has sent a message to the office as they can probaly arrange this at his next visit - he needs to have a pulmonologist which his PCP will need to arrange for him - ambulatory pulse ox about 95% - no coughing- he is not wheezing and thus not having a COPD exacerbation - overall seems to be anxious and this may be causing him to feel that he is short of breath    Orthostatic hypotension - on 12/12 I noted that he was orthostatic- held IV Lasix and given fluid boluses which helped resolve the issue- Lasix remains on hold-      Hypoxia at night - needs a sleep study as outpt ASAP- have discussed this with him- see above   Chronic back pain - cont home Oxycodone  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   DVT prophylaxis:Lovenox Code Status: Full code Family Communication: spoke with son today Disposition Plan:  Follow up BP tonight Consultants:   cardiology Procedures:  2 D ECHO  Study Conclusions  - Left ventricle: The cavity size was normal. Wall thickness was   increased in a pattern of moderate LVH. Systolic function was   mildly to moderately reduced. The estimated ejection fraction was   in the range of 40% to 45%. Anterior and anteroseptal   hypokinesis. Doppler parameters are consistent with abnormal left   ventricular relaxation (grade 1 diastolic dysfunction). The  E/e&'   ratio is between 8-15, suggesting indeterminate LV filling   pressure. - Left atrium: The atrium was normal in size. - Inferior vena cava: The vessel was normal in size. The   respirophasic diameter changes were in the normal range (>= 50%),   consistent with normal central venous pressure.  Impressions:  - Compared to a prior study in 2019, the LVEF is lower at 40-45%    wtih anterior/anteroseptal hypokinesis and grade 1 DD. Antimicrobials:  Anti-infectives (From admission, onward)   None       Objective: Vitals:   07/19/18 0818 07/19/18 0824 07/19/18 0919 07/19/18 1041  BP: 117/74 (!) 145/75 118/71 (!) 154/76  Pulse:      Resp: (!) 21 (!) 22 14   Temp:      TempSrc:      SpO2:      Weight:      Height:        Intake/Output Summary (Last 24 hours) at 07/19/2018 1155 Last data filed at 07/18/2018 1200 Gross per 24 hour  Intake 240 ml  Output -  Net 240 ml   Filed Weights   07/17/18 0650 07/18/18 0524 07/19/18 0400  Weight: 129.2 kg 130.3 kg 130.6 kg    Examination: General exam: Appears comfortable  HEENT: PERRLA, oral mucosa moist, no sclera icterus or thrush Respiratory system: Clear to auscultation. Respiratory effort normal. Pulse ox 97% on room air Cardiovascular system: S1 & S2 heard,  No murmurs  Gastrointestinal system: Abdomen soft, non-tender, nondistended. Normal bowel sound. No organomegaly Central nervous system: Alert and oriented. No focal neurological deficits. Extremities: No cyanosis, clubbing - has TEDS on- moderate pedal edema Skin: No rashes or ulcers Psychiatry:  Mood & affect appropriate.     Data Reviewed: I have personally reviewed following labs and imaging studies  CBC: Recent Labs  Lab 07/15/18 2039 07/16/18 0156 07/17/18 0312 07/18/18 0504  WBC 9.5 9.0 8.1 7.6  NEUTROABS  --   --  4.6  --   HGB 13.3 12.3* 12.7* 11.5*  HCT 43.2 40.5 41.2 37.3*  MCV 90.0 89.4 89.8 90.8  PLT 346 261 256 209   Basic Metabolic  Panel: Recent Labs  Lab 07/15/18 2039 07/16/18 0156 07/16/18 1338 07/17/18 0312 07/18/18 0504  NA 133*  --  136 138 137  K 3.4*  --  4.1 3.6 3.7  CL 85*  --  90* 91* 93*  CO2 32  --  28 33* 31  GLUCOSE 146*  --  134* 144* 124*  BUN 18  --  13 16 12   CREATININE 1.29* 1.14 1.07 1.19 1.13  CALCIUM 9.2  --  9.3 9.0 8.8*   GFR: Estimated Creatinine Clearance: 91.2 mL/min (by C-G formula  based on SCr of 1.13 mg/dL). Liver Function Tests: No results for input(s): AST, ALT, ALKPHOS, BILITOT, PROT, ALBUMIN in the last 168 hours. No results for input(s): LIPASE, AMYLASE in the last 168 hours. No results for input(s): AMMONIA in the last 168 hours. Coagulation Profile: Recent Labs  Lab 07/17/18 1926  INR 1.03   Cardiac Enzymes: Recent Labs  Lab 07/16/18 0156 07/16/18 0735 07/16/18 1338  TROPONINI <0.03 <0.03 <0.03   BNP (last 3 results) No results for input(s): PROBNP in the last 8760 hours. HbA1C: No results for input(s): HGBA1C in the last 72 hours. CBG: No results for input(s): GLUCAP in the last 168 hours. Lipid Profile: No results for input(s): CHOL, HDL, LDLCALC, TRIG, CHOLHDL, LDLDIRECT in the last 72 hours. Thyroid Function Tests: No results for input(s): TSH, T4TOTAL, FREET4, T3FREE, THYROIDAB in the last 72 hours. Anemia Panel: No results for input(s): VITAMINB12, FOLATE, FERRITIN, TIBC, IRON, RETICCTPCT in the last 72 hours. Urine analysis:    Component Value Date/Time   COLORURINE STRAW (A) 11/15/2017 1120   APPEARANCEUR CLEAR 11/15/2017 1120   LABSPEC 1.002 (L) 11/15/2017 1120   PHURINE 5.0 11/15/2017 1120   GLUCOSEU NEGATIVE 11/15/2017 1120   HGBUR NEGATIVE 11/15/2017 1120   BILIRUBINUR NEGATIVE 11/15/2017 1120   KETONESUR NEGATIVE 11/15/2017 1120   PROTEINUR NEGATIVE 11/15/2017 1120   UROBILINOGEN 1.0 06/10/2015 1106   NITRITE NEGATIVE 11/15/2017 1120   LEUKOCYTESUR NEGATIVE 11/15/2017 1120   Sepsis  Labs: @LABRCNTIP (procalcitonin:4,lacticidven:4) )No results found for this or any previous visit (from the past 240 hour(s)).       Radiology Studies: No results found.    Scheduled Meds: . aspirin EC  81 mg Oral Daily  . atorvastatin  80 mg Oral q1800  . cholecalciferol  1,000 Units Oral Daily  . enoxaparin (LOVENOX) injection  40 mg Subcutaneous Q24H  . escitalopram  20 mg Oral Daily  . ferrous sulfate  325 mg Oral Q breakfast  . losartan  25 mg Oral Daily  . metoprolol tartrate  12.5 mg Oral BID  . multivitamin with minerals  1 tablet Oral Daily  . naloxegol oxalate  25 mg Oral QPC breakfast  . nitroGLYCERIN  0.4 mg Transdermal Daily  . nortriptyline  25 mg Oral QHS  . pantoprazole  40 mg Oral BID  . polyethylene glycol  17 g Oral Daily  . sodium chloride flush  3 mL Intravenous Q12H  . tamsulosin  0.4 mg Oral QPC supper  . ticagrelor  90 mg Oral BID  . traZODone  300 mg Oral QHS  . vitamin C  500 mg Oral Daily   Continuous Infusions: . sodium chloride       LOS: 3 days    Time spent in minutes: 35    Debbe Odea, MD [facility] Pager: [email or web address] Password [email or web address] 07/19/2018, 11:55 AM

## 2018-07-19 NOTE — Discharge Summary (Signed)
Physician Discharge Summary  VESTAL MARKIN PJA:[id number] DOB: 01/02/1951 DOA: 07/15/2018  PCP: Aletha Halim., PA-C  Admit date: 07/15/2018 Discharge date: 07/20/2018  Admitted From: home   Disposition:  home  Recommendations for Outpatient Follow-up:  1. Cardiac f/u planned for Dr Gwenlyn Found 2. 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 He has had this dyspnea for a few months and is has been worse recently with exertion and with laying flat and went down the right arm. He also noted chest pain when he was laying in bed. He was short of breath when this pain occurred.   Principal Problem:   Chest pain- h/o CAD s/p stents - has ruled out for MI - cardiology assisting with management - cath 12/12, Dr End - see report below- received a stent to the LAD and recommended to have medical therapy for obtuse marginal - on DAPT with ASA and Tricagrelor for 12 months minimum  Active Problems: Chronic systolic and mild diastolic CHF - no exacerbation noted - mild new drop in EFto 40-45% noted-  see ECHO below-  - he had stated he was placed on Metolazone  for a few days as outpt recently as well - no acute exacerbation during this admission- in fact, he developed orthostatic hypotension when Lasix was given in the hospital- IVF neded to be given and it was subsequently held - cardiology has recommended reducing Lasix to QOD for now-   Chronic pedal edema- likely chronic venous stasis - venous insufficiency- continued to have edema despite being orthostatic - advised to continue to wear TEDS- his pedal edema should not be used to check the status of his heart failure  Dyspnea on exertion- COPD listed in problem list Acute on chronic - h/o smoking since age 15- quit 4 yrs ago, has not ever had PFTs - he has gained 20 lbs in recently- high risk for restrictive lung disease based on his body habitus - ECHO noted below- he was not in acute heart failure on admission per cardiology and I agree - ambulatory pulse ox is about 95%- he is not wheezing and thus not having a COPD exacerbation - he continues to state that he is not able to take a deep breath and feels short of breath- ? If anxiety  - Once again, he is not in acute CHF, or having a COPD flare. His pulse ox has been in the mid to high 90s at rest and with exertion- I had a low suspicion for PE but we did attempt a CT and VQ scan which he was not able to lay flat for (  due to chronic  back pain)   Hypoxia while asleep- likely sleep apnea -pulse ox noted during the hospital stay  to drop to 80s when he is asleep - will need outpt sleep study  - have spoken with him about this and have spoken with cardiology- message sent to cardiology office to help arrange it in the office (there are at 2 providers who manage OSA there)   Hypoxia at night - needs a sleep study as outpt ASAP- have discussed this with him and cardiology who will try to arrange it as outpt   Chronic back pain - cont home Oxycodone  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 - advised to reduce his weight   Procedures:  2 D ECHO Study Conclusions  - Left ventricle: The cavity size was normal. Wall thickness was increased in a pattern of moderate LVH. Systolic function was mildly to moderately reduced. The estimated ejection fraction was in the range of 40% to 45%. Anterior and anteroseptal hypokinesis. Doppler parameters are consistent with abnormal left ventricular relaxation (grade 1 diastolic dysfunction). The E/e&' ratio is between 8-15, suggesting indeterminate LV filling pressure. - Left atrium: The atrium was normal in size. - Inferior vena cava: The vessel was normal in size. The respirophasic diameter changes were in the normal range (>= 50%), consistent with normal central venous pressure.  Impressions:  - Compared to a prior study in 2019, the LVEF is lower at 40-45%  wtih anterior/anteroseptal hypokinesis and grade 1 DD.  Cath 12/12: FINDINGS: 1. Significant 2 vessel coronary artery disease involving the LAD and LCx/OM1.  There are sequential 30-70% proximal and mid LAD lesions that are hemodynamically significant (DFR 0.88).  Remote stent extending from the proximal LCx into OM1 has diffuse severe ISR in OM1.  There is 50% stenosis in mid LCx where it is jailed by the stent extending into OM.  This lesion is not significant by DFR. 2. Mild, non-obstructive RCA disease. 3. Normal left and right heart filling pressures as well as Fick cardiac output. 4. Successful PCI to mid LAD using Synergy 3.0 x 12 mm DES post-dilated to 3.3 mm with 0% residual stenosis and TIMI-3 flow.  RECOMMENDATIONS: 1. Aggressive secondary prevention and medical therapy for OM1 ISR.  If patient has continued symptoms despite optimal antianginal therapy, PCI to OM1 could be considered. 2. DAPT with ASA and ticagrelor for at least 12 months.  Discharge Exam: Vitals:   07/20/18 0749 07/20/18 1147  BP: 123/62 (!)  128/56  Pulse: (!) 53 69  Resp: 19 19  Temp: 97.9 F (36.6 C) 98.3 F (36.8 C)  SpO2: 94% 97%   Vitals:   07/19/18 2059 07/20/18 0521 07/20/18 0749 07/20/18 1147  BP: (!) 135/58 (!) 130/52 123/62 (!) 128/56  Pulse: 70 63 (!) 53 69  Resp:  18 19 19   Temp:  97.9 F (36.6 C) 97.9 F (36.6 C) 98.3 F (36.8 C)  TempSrc:  Oral Oral Oral  SpO2:  93% 94% 97%  Weight:  130.3 kg    Height:        General: Pt is alert, awake, not in acute distress Cardiovascular: RRR, S1/S2 +, no rubs, no gallops Respiratory: CTA bilaterally, no wheezing, no rhonchi Abdominal: Soft, NT, ND, bowel sounds + Extremities: 2 + pedal edema has on TEDS   Discharge Instructions  Discharge Instructions    (HEART FAILURE PATIENTS) Call MD:  April Manson  you have any of the following symptoms: 1) 3 pound weight gain in 24 hours or 5 pounds in 1 week 2) shortness of breath, with or without a dry hacking cough 3) swelling in the hands, feet or stomach 4) if you have to sleep on extra pillows at night in order to breathe.   Complete by:  As directed    Amb Referral to Cardiac Rehabilitation   Complete by:  As directed    Diagnosis:   Coronary Stents PTCA     Call MD for:  persistant dizziness or light-headedness   Complete by:  As directed    Diet - low sodium heart healthy   Complete by:  As directed    Diet - low sodium heart healthy   Complete by:  As directed    Increase activity slowly   Complete by:  As directed    Increase activity slowly   Complete by:  As directed      Allergies as of 07/20/2018      Reactions   Ciprofloxacin Anaphylaxis   Penicillins Rash   Has patient had a PCN reaction causing immediate rash, facial/tongue/throat swelling, SOB or lightheadedness with hypotension:unsure Has patient had a PCN reaction causing severe rash involving mucus membranes or skin necrosis:unsure Has patient had a PCN reaction that required hospitalization:unsure Has patient had a PCN reaction occurring  within the last 10 years:NO If all of the above answers are "NO", then may proceed with Cephalosporin use.   Tramadol Hcl Rash      Medication List    STOP taking these medications   lovastatin 20 MG tablet Commonly known as:  MEVACOR   meloxicam 7.5 MG tablet Commonly known as:  MOBIC   Turmeric 500 MG Caps     TAKE these medications   aspirin EC 81 MG tablet Take 81 mg by mouth daily.   atorvastatin 80 MG tablet Commonly known as:  LIPITOR Take 1 tablet (80 mg total) by mouth daily at 6 PM.   bisacodyl 10 MG suppository Commonly known as:  DULCOLAX Place 10 mg rectally as needed for moderate constipation.   calcium carbonate 500 MG chewable tablet Commonly known as:  TUMS - dosed in mg elemental calcium Chew 1 tablet by mouth daily as needed for indigestion or heartburn.   cholecalciferol 1000 units tablet Commonly known as:  VITAMIN D Take 1,000 Units by mouth daily.   escitalopram 20 MG tablet Commonly known as:  LEXAPRO Take 20 mg by mouth daily.   ferrous sulfate 325 (65 FE) MG tablet Take 325 mg by mouth daily.   furosemide 20 MG tablet Commonly known as:  LASIX Take 2 tablets (40 mg total) by mouth every other day. What changed:  when to take this   losartan 25 MG tablet Commonly known as:  COZAAR Take 1 tablet (25 mg total) by mouth daily. What changed:    medication strength  how much to take   metoprolol tartrate 25 MG tablet Commonly known as:  LOPRESSOR Take 0.5 tablets (12.5 mg total) by mouth 2 (two) times daily.   MOVANTIK 25 MG Tabs tablet Generic drug:  naloxegol oxalate Take 25 mg by mouth daily after breakfast.   multivitamin with minerals Tabs tablet Take 1 tablet by mouth daily. FOR SENIORS   nitroGLYCERIN 0.4 MG SL tablet Commonly known as:  NITROSTAT Place 0.4 mg under the tongue every 5 (five) minutes x 3 doses as needed for chest pain.   nortriptyline 25  MG capsule Commonly known as:  PAMELOR Take 25 mg by mouth at  bedtime.   Oxycodone HCl 20 MG Tabs Take 20 mg by mouth every 6 (six) hours as needed (pain).   pantoprazole 40 MG tablet Commonly known as:  PROTONIX Take 1 tablet (40 mg total) by mouth 2 (two) times daily.   polyethylene glycol packet Commonly known as:  MIRALAX / GLYCOLAX Take 17 g by mouth daily.   potassium chloride 10 MEQ tablet Commonly known as:  K-DUR,KLOR-CON Take 1 tablet (10 mEq total) by mouth every other day. What changed:  when to take this   promethazine 25 MG tablet Commonly known as:  PHENERGAN Take 25 mg by mouth 3 (three) times daily as needed for nausea.   senna 8.6 MG Tabs tablet Commonly known as:  SENOKOT Take 2 tablets by mouth 2 (two) times daily.   silodosin 8 MG Caps capsule Commonly known as:  RAPAFLO Take 1 capsule (8 mg total) by mouth daily with breakfast. What changed:  when to take this   SUMAtriptan 100 MG tablet Commonly known as:  IMITREX Take 100 mg by mouth every 2 (two) hours as needed for migraine. May repeat in 2 hours if headache persists or recurs.   ticagrelor 90 MG Tabs tablet Commonly known as:  BRILINTA Take 1 tablet (90 mg total) by mouth 2 (two) times daily.   tiZANidine 4 MG tablet Commonly known as:  ZANAFLEX Take 4 mg by mouth every 6 (six) hours as needed for muscle spasms.   traZODone 150 MG tablet Commonly known as:  DESYREL Take 300 mg by mouth at bedtime.   VENTOLIN HFA 108 (90 Base) MCG/ACT inhaler Generic drug:  albuterol Inhale 2 puffs into the lungs every 6 (six) hours as needed for shortness of breath.   VISINE OP Place 1 drop into both eyes daily as needed (dry eyes).   vitamin C 500 MG tablet Commonly known as:  ASCORBIC ACID Take 500 mg by mouth daily.      Follow-up Information    Lorretta Harp, MD Follow up.   Specialties:  Cardiology, Radiology Why:  The office will call you to arrange follow up in the office in the  next 1-2 weeks with a Np or PA that works with Dr. Pearla Dubonnet information: [place] [address] [phone]        Aletha Halim., PA-C Follow up in 1 week(s).   Specialty:  Family Medicine Contact information: [address] [place] [address] [phone]          Allergies  Allergen Reactions  . Ciprofloxacin Anaphylaxis  . Penicillins Rash    Has patient had a PCN reaction causing immediate rash, facial/tongue/throat swelling, SOB or lightheadedness with hypotension:unsure Has patient had a PCN reaction causing severe rash involving mucus membranes or skin necrosis:unsure Has patient had a PCN reaction that required hospitalization:unsure Has patient had a PCN reaction occurring within the last 10 years:NO If all of the above answers are "NO", then may proceed with Cephalosporin use.   . Tramadol Hcl Rash     Procedures/Studies:    Dg Chest 2 View  Result Date: 07/15/2018 CLINICAL DATA:  Mid to LEFT chest pain radiating to LEFT jaw since this morning. History of RIGHT rib osteosarcoma. EXAM: CHEST - 2 VIEW COMPARISON:  CT chest May 13, 2018 FINDINGS: Cardiac silhouette is mildly enlarged. Mediastinal silhouette is not suspicious. Mildly increased lung volumes  with flattened hemidiaphragms and chronic interstitial changes. Persistently elevated RIGHT hemidiaphragm with RIGHT lung scarring and pleural thickening and plaques. LEFT lung base scarring. No pneumothorax. Osteopenia. Catheter projects in lower lumbar spine, probable epidural catheter. IMPRESSION: Mild cardiomegaly. Chronic interstitial changes and increased lung volume/COPD. Pleural thickening and pleural plaques.  Multifocal scarring. Electronically Signed   By: Elon Alas M.D.   On: 07/15/2018 21:15     The results of significant diagnostics from this hospitalization (including imaging, microbiology, ancillary and laboratory) are listed below for reference.     Microbiology: No results found for this or any  previous visit (from the past 240 hour(s)).   Labs: BNP (last 3 results) Recent Labs    07/15/18 2239  BNP 5.9   Basic Metabolic Panel: Recent Labs  Lab 07/15/18 2039 07/16/18 0156 07/16/18 1338 07/17/18 0312 07/18/18 0504  NA 133*  --  136 138 137  K 3.4*  --  4.1 3.6 3.7  CL 85*  --  90* 91* 93*  CO2 32  --  28 33* 31  GLUCOSE 146*  --  134* 144* 124*  BUN 18  --  13 16 12   CREATININE 1.29* 1.14 1.07 1.19 1.13  CALCIUM 9.2  --  9.3 9.0 8.8*   Liver Function Tests: No results for input(s): AST, ALT, ALKPHOS, BILITOT, PROT, ALBUMIN in the last 168 hours. No results for input(s): LIPASE, AMYLASE in the last 168 hours. No results for input(s): AMMONIA in the last 168 hours. CBC: Recent Labs  Lab 07/15/18 2039 07/16/18 0156 07/17/18 0312 07/18/18 0504  WBC 9.5 9.0 8.1 7.6  NEUTROABS  --   --  4.6  --   HGB 13.3 12.3* 12.7* 11.5*  HCT 43.2 40.5 41.2 37.3*  MCV 90.0 89.4 89.8 90.8  PLT 346 261 256 221   Cardiac Enzymes: Recent Labs  Lab 07/16/18 0156 07/16/18 0735 07/16/18 1338  TROPONINI <0.03 <0.03 <0.03   BNP: Invalid input(s): POCBNP CBG: No results for input(s): GLUCAP in the last 168 hours. D-Dimer No results for input(s): DDIMER in the last 72 hours. Hgb A1c No results for input(s): HGBA1C in the last 72 hours. Lipid Profile No results for input(s): CHOL, HDL, LDLCALC, TRIG, CHOLHDL, LDLDIRECT in the last 72 hours. Thyroid function studies No results for input(s): TSH, T4TOTAL, T3FREE, THYROIDAB in the last 72 hours.  Invalid input(s): FREET3 Anemia work up No results for input(s): VITAMINB12, FOLATE, FERRITIN, TIBC, IRON, RETICCTPCT in the last 72 hours. Urinalysis    Component Value Date/Time   COLORURINE STRAW (A) 11/15/2017 1120   APPEARANCEUR CLEAR 11/15/2017 1120   LABSPEC 1.002 (L) 11/15/2017 1120   PHURINE 5.0 11/15/2017 1120   GLUCOSEU NEGATIVE 11/15/2017 1120   HGBUR NEGATIVE 11/15/2017 1120   BILIRUBINUR NEGATIVE 11/15/2017  1120   KETONESUR NEGATIVE 11/15/2017 1120   PROTEINUR NEGATIVE 11/15/2017 1120   UROBILINOGEN 1.0 06/10/2015 1106   NITRITE NEGATIVE 11/15/2017 1120   LEUKOCYTESUR NEGATIVE 11/15/2017 1120   Sepsis Labs Invalid input(s): PROCALCITONIN,  WBC,  LACTICIDVEN Microbiology No results found for this or any previous visit (from the past 240 hour(s)).   Time coordinating discharge in minutes: 65  SIGNED:   Debbe Odea, MD  [facility] 07/20/2018, 2:09 PM Pager   If 7PM-7AM, please contact night-coverage [email or web address] Password [email or web address]

## 2018-07-19 NOTE — Progress Notes (Signed)
Progress Note  Patient Name: Jerry [name] Date of Encounter: 07/19/2018  Primary Cardiologist: Quay Burow, MD    Subjective   He status post stenting of his LAD yesterday.  His hemodynamics were normal at the time of heart catheterization. There is that the etiology of his shortness of breath is a pulmonary issue and is not due to congestive heart failure.  He had an episode of hypotension last night.  This occurred after receiving his pain medications. His losartan has been decreased from 100 mg to 25 mg for this am .  He also has been started on metoprolol.  Inpatient Medications    Scheduled Meds: . aspirin EC  81 mg Oral Daily  . atorvastatin  80 mg Oral q1800  . cholecalciferol  1,000 Units Oral Daily  . enoxaparin (LOVENOX) injection  40 mg Subcutaneous Q24H  . escitalopram  20 mg Oral Daily  . ferrous sulfate  325 mg Oral Q breakfast  . losartan  25 mg Oral Daily  . metoprolol tartrate  12.5 mg Oral BID  . multivitamin with minerals  1 tablet Oral Daily  . naloxegol oxalate  25 mg Oral QPC breakfast  . nitroGLYCERIN  0.4 mg Transdermal Daily  . nortriptyline  25 mg Oral QHS  . pantoprazole  40 mg Oral BID  . polyethylene glycol  17 g Oral Daily  . sodium chloride flush  3 mL Intravenous Q12H  . tamsulosin  0.4 mg Oral QPC supper  . ticagrelor  90 mg Oral BID  . traZODone  300 mg Oral QHS  . vitamin C  500 mg Oral Daily   Continuous Infusions: . sodium chloride     PRN Meds: sodium chloride, acetaminophen, albuterol, bisacodyl, calcium carbonate, ondansetron (ZOFRAN) IV, oxyCODONE, sodium chloride flush, tiZANidine   Vital Signs    Vitals:   07/19/18 0807 07/19/18 0818 07/19/18 0824 07/19/18 0919  BP: (!) 105/57 117/74 (!) 145/75 118/71  Pulse:      Resp: 19 (!) 21 (!) 22   Temp:      TempSrc:      SpO2:      Weight:      Height:        Intake/Output Summary (Last 24 hours) at 07/19/2018 0347 Last data filed at 07/18/2018 1200 Gross per 24  hour  Intake 240 ml  Output -  Net 240 ml   Filed Weights   07/17/18 0650 07/18/18 0524 07/19/18 0400  Weight: 129.2 kg 130.3 kg 130.6 kg    Telemetry     NSR  - Personally Reviewed  ECG     NSR  - Personally Reviewed  Physical Exam   GEN:  Morbidly obese middle-aged gentleman, no acute distress Neck: No JVD Cardiac: RRR,.  Respiratory: Clear to auscultation bilaterally. GI: Soft, nontender, non-distended  MS:  Right radial cath site appears to be healing well.  There is a small amount of ecchymosis.  It is moderately tender. Neuro:  Nonfocal  Psych: Normal affect   Labs    Chemistry Recent Labs  Lab 07/16/18 1338 07/17/18 0312 07/18/18 0504  NA 136 138 137  K 4.1 3.6 3.7  CL 90* 91* 93*  CO2 28 33* 31  GLUCOSE 134* 144* 124*  BUN 13 16 12   CREATININE 1.07 1.19 1.13  CALCIUM 9.3 9.0 8.8*  GFRNONAA >60 >60 >60  GFRAA >60 >60 >60  ANIONGAP 18* 14 13     Hematology Recent Labs  Lab 07/16/18 0156 07/17/18  0865 07/18/18 0504  WBC 9.0 8.1 7.6  RBC 4.53 4.59 4.11*  HGB 12.3* 12.7* 11.5*  HCT 40.5 41.2 37.3*  MCV 89.4 89.8 90.8  MCH 27.2 27.7 28.0  MCHC 30.4 30.8 30.8  RDW 13.9 14.1 14.0  PLT 261 256 221    Cardiac Enzymes Recent Labs  Lab 07/16/18 0156 07/16/18 0735 07/16/18 1338  TROPONINI <0.03 <0.03 <0.03    Recent Labs  Lab 07/15/18 2049 07/15/18 2248  TROPIPOC 0.00 0.00     BNP Recent Labs  Lab 07/15/18 2239  BNP 5.9     DDimer  Recent Labs  Lab 07/16/18 0156  DDIMER 0.68*     Radiology    No results found.  Cardiac Studies     Patient Profile     67 y.o. male mated with significant shortness of breath.  Heart catheterization revealed no evidence of congestive heart failure.  He was found to have a moderately tight LAD stenosis which was stented.  Assessment & Plan    1.  Shortness of breath: The patient was admitted with worsening shortness of breath.  He had a heart catheterization which revealed normal  hemodynamics.  He was found to have a moderately tight LAD stenosis which was stented.  He shortness of breath is not improved.  Plan is for further work-up per internal medicine team.  I suspect that he has some pulmonary issues.  His d-dimer was mildly elevated.  A chest CTA was planned but the patient was not able to lie flat.  Will defer to primary MD for further work-up.  2.  Coronary artery disease: Patient is status post stenting of his LAD.  He is not having any angina.  He will need to follow-up with Dr. Gwenlyn Found in the office. Continue aspirin and Brilinta twice a day.  Continue statin.  3.  Hypertension: The patient was on losartan 100 mg a day.  We have reduced the losartan to 25 mg a day now.  We will follow his blood pressures today.  [facility] will sign off.   Medication Recommendations:  Cont current meds.    Other recommendations (labs, testing, etc):  Needs further eval of his shortness of breat  Follow up as an outpatient:   With Dr. Gwenlyn Found at the [facility] office  For questions or updates, please contact [facility] Please consult [email or web address] for contact info under        Signed, Mertie Moores, MD  07/19/2018, 9:27 AM

## 2018-07-19 NOTE — Progress Notes (Signed)
Pt received from 6 C as a transfer, pt had a cardiac cath on 12/12, possibly will be discharged tomorrow, vitals stable, Tele monitor hooked up, denies CP and exertion now, in RA,  will continue to monitor  Palma Holter, RN

## 2018-07-19 NOTE — Progress Notes (Signed)
Patient discharged to 3E23 via wheelchair in stable condition with dentures and cane.

## 2018-07-20 DIAGNOSIS — R600 Localized edema: Secondary | ICD-10-CM

## 2018-07-20 DIAGNOSIS — I959 Hypotension, unspecified: Secondary | ICD-10-CM

## 2018-07-20 DIAGNOSIS — Z955 Presence of coronary angioplasty implant and graft: Secondary | ICD-10-CM

## 2018-07-20 DIAGNOSIS — I878 Other specified disorders of veins: Secondary | ICD-10-CM

## 2018-07-20 MED ORDER — FUROSEMIDE 20 MG PO TABS: 40.0000 mg | ORAL_TABLET | ORAL | 0 refills | Status: DC

## 2018-07-20 MED ORDER — POTASSIUM CHLORIDE CRYS ER 10 MEQ PO TBCR: 10.0000 meq | EXTENDED_RELEASE_TABLET | ORAL | Status: DC

## 2018-07-20 NOTE — Progress Notes (Signed)
Progress Note  Patient Name: Jerry [name] Date of Encounter: 07/20/2018  Primary Cardiologist: Quay Burow, MD    Subjective   He status post stenting of his LAD yesterday.  His hemodynamics were normal at the time of heart catheterization. There is that the etiology of his shortness of breath is a pulmonary issue and is not due to congestive heart failure.  He had an episode of hypotension 2 nights ago .  This occurred after receiving his pain medications. His losartan has been decreased from 100 mg to 25 mg for this am .  He also has been started on metoprolol.  He had a much better night last night.    He is been on Lasix primarily for leg edema.  Long discussion about of leg edema.  I have encouraged him to continue with compression hose.  I have encouraged him to get a lounge doctor leg rest. Needs to ambulate more. He had normal left ventricular filling pressures at the time of heart catheterization    Inpatient Medications    Scheduled Meds: . aspirin EC  81 mg Oral Daily  . atorvastatin  80 mg Oral q1800  . cholecalciferol  1,000 Units Oral Daily  . enoxaparin (LOVENOX) injection  40 mg Subcutaneous Q24H  . escitalopram  20 mg Oral Daily  . ferrous sulfate  325 mg Oral Q breakfast  . losartan  25 mg Oral Daily  . metoprolol tartrate  12.5 mg Oral BID  . multivitamin with minerals  1 tablet Oral Daily  . naloxegol oxalate  25 mg Oral QPC breakfast  . nitroGLYCERIN  0.4 mg Transdermal Daily  . nortriptyline  25 mg Oral QHS  . pantoprazole  40 mg Oral BID  . polyethylene glycol  17 g Oral Daily  . sodium chloride flush  3 mL Intravenous Q12H  . tamsulosin  0.4 mg Oral QPC supper  . ticagrelor  90 mg Oral BID  . traZODone  300 mg Oral QHS  . vitamin C  500 mg Oral Daily   Continuous Infusions: . sodium chloride     PRN Meds: sodium chloride, acetaminophen, albuterol, bisacodyl, calcium carbonate, ondansetron (ZOFRAN) IV, oxyCODONE, sodium chloride  flush, tiZANidine   Vital Signs    Vitals:   07/19/18 2059 07/20/18 0521 07/20/18 0749 07/20/18 1147  BP: (!) 135/58 (!) 130/52 123/62 (!) 128/56  Pulse: 70 63 (!) 53 69  Resp:  18 19 19   Temp:  97.9 F (36.6 C) 97.9 F (36.6 C) 98.3 F (36.8 C)  TempSrc:  Oral Oral Oral  SpO2:  93% 94% 97%  Weight:  130.3 kg    Height:        Intake/Output Summary (Last 24 hours) at 07/20/2018 1246 Last data filed at 07/20/2018 0951 Gross per 24 hour  Intake 2100 ml  Output 3875 ml  Net -1775 ml   Filed Weights   07/18/18 0524 07/19/18 0400 07/20/18 0521  Weight: 130.3 kg 130.6 kg 130.3 kg    Telemetry     NSR  - Personally Reviewed  ECG     NSR  - Personally Reviewed  Physical Exam   Physical Exam: Blood pressure (!) 128/56, pulse 69, temperature 98.3 F (36.8 C), temperature source Oral, resp. rate 19, height 6\' 2"  (1.88 m), weight 130.3 kg, SpO2 97 %.  GEN:  Well nourished, well developed in no acute distress HEENT: Normal NECK: No JVD; No carotid bruits LYMPHATICS: No lymphadenopathy CARDIAC: RRR  RESPIRATORY:  Clear  to auscultation without rales, wheezing or rhonchi  ABDOMEN: Soft, non-tender, non-distended MUSCULOSKELETAL:  No edema  SKIN: Warm and dry NEUROLOGIC:  Alert and oriented x 3   Labs    Chemistry Recent Labs  Lab 07/16/18 1338 07/17/18 0312 07/18/18 0504  NA 136 138 137  K 4.1 3.6 3.7  CL 90* 91* 93*  CO2 28 33* 31  GLUCOSE 134* 144* 124*  BUN 13 16 12   CREATININE 1.07 1.19 1.13  CALCIUM 9.3 9.0 8.8*  GFRNONAA >60 >60 >60  GFRAA >60 >60 >60  ANIONGAP 18* 14 13     Hematology Recent Labs  Lab 07/16/18 0156 07/17/18 0312 07/18/18 0504  WBC 9.0 8.1 7.6  RBC 4.53 4.59 4.11*  HGB 12.3* 12.7* 11.5*  HCT 40.5 41.2 37.3*  MCV 89.4 89.8 90.8  MCH 27.2 27.7 28.0  MCHC 30.4 30.8 30.8  RDW 13.9 14.1 14.0  PLT 261 256 221    Cardiac Enzymes Recent Labs  Lab 07/16/18 0156 07/16/18 0735 07/16/18 1338  TROPONINI <0.03 <0.03 <0.03      Recent Labs  Lab 07/15/18 2049 07/15/18 2248  TROPIPOC 0.00 0.00     BNP Recent Labs  Lab 07/15/18 2239  BNP 5.9     DDimer  Recent Labs  Lab 07/16/18 0156  DDIMER 0.68*     Radiology    No results found.  Cardiac Studies     Patient Profile     67 y.o. male mated with significant shortness of breath.  Heart catheterization revealed no evidence of congestive heart failure.  He was found to have a moderately tight LAD stenosis which was stented.  Assessment & Plan    1.  Shortness of breath: The patient was admitted with worsening shortness of breath.  He had a heart catheterization which revealed normal hemodynamics.       2.  Coronary artery disease: He status post stenting in the past.  He did not have any significant lesions at heart catheterization.  3.  History of chronic leg edema: Have given him information on the lounge doctor leg rest.  He still needs to limit his salt.  Continue wearing compression hose. We will reduce his Lasix to 40 mg every other day.     For your  leg edema you  should do  the following 1. Leg elevation - I recommend the Lounge Dr. Leg rest.  See below for details  2. Salt restriction  - 3. Walk regularly 4. Compression hose - 5. Weight loss    Available on [email or web address] Or  Go to [email or web address]        3.  Hypertension: The patient was on losartan 100 mg a day.  We have reduced the losartan to 25 mg a day now.    [facility] will sign off.   Medication Recommendations:  Cont current meds.    Other recommendations (labs, testing, etc):  Needs further eval of his shortness of breat  Follow up as an outpatient:   With Dr. Gwenlyn Found at the [facility] office  For questions or updates, please contact [place] Please consult [email or web address] for contact info under        Signed, Mertie Moores, MD  07/20/2018, 12:46 PM

## 2018-07-20 NOTE — Progress Notes (Signed)
Pt got discharged to home, discharge instructions provided and patient showed understanding to it, IV taken out,Telemonitor DC,pt left unit in wheelchair with all of the belongings accompanied with a family member (Son)  Sahvanna Mcmanigal, RN 

## 2018-07-21 ENCOUNTER — Other Ambulatory Visit: Payer: Self-pay | Admitting: Physician Assistant

## 2018-07-21 DIAGNOSIS — R0683 Snoring: Secondary | ICD-10-CM

## 2018-07-21 NOTE — Progress Notes (Signed)
Sleep study ordered

## 2018-08-07 ENCOUNTER — Ambulatory Visit: Payer: Medicare HMO | Admitting: Cardiovascular Disease

## 2018-08-13 ENCOUNTER — Encounter: Payer: Self-pay | Admitting: Cardiovascular Disease

## 2018-08-13 ENCOUNTER — Ambulatory Visit: Payer: Medicare HMO | Admitting: Cardiovascular Disease

## 2018-08-13 DIAGNOSIS — I10 Essential (primary) hypertension: Secondary | ICD-10-CM

## 2018-08-13 DIAGNOSIS — I2510 Atherosclerotic heart disease of native coronary artery without angina pectoris: Secondary | ICD-10-CM

## 2018-08-13 DIAGNOSIS — I5032 Chronic diastolic (congestive) heart failure: Secondary | ICD-10-CM

## 2018-08-13 DIAGNOSIS — I421 Obstructive hypertrophic cardiomyopathy: Secondary | ICD-10-CM

## 2018-08-13 DIAGNOSIS — E782 Mixed hyperlipidemia: Secondary | ICD-10-CM

## 2018-08-13 DIAGNOSIS — Z720 Tobacco use: Secondary | ICD-10-CM

## 2018-08-13 DIAGNOSIS — R601 Generalized edema: Secondary | ICD-10-CM

## 2018-08-13 NOTE — Assessment & Plan Note (Signed)
Jerry [name] was admitted to [facility] on 07/15/2018 with volume overload and diastolic heart failure.  His echo did reveal moderate decrease in LV function compared to prior echoes with an EF of 40 to 45% compared to 55 to 60% 08/29/2017.  He was diuresed with improvement clinically.  He was discharged on daily oral diuretics.

## 2018-08-13 NOTE — Assessment & Plan Note (Signed)
History of essential hypertension her blood pressure measured today 134/74.  He is on metoprolol to current meds at current dosing

## 2018-08-13 NOTE — Assessment & Plan Note (Signed)
Bilateral lower extremity edema probably related to diastolic dysfunction on daily diuretics.  He is aware of salt avoidance.

## 2018-08-13 NOTE — Assessment & Plan Note (Signed)
History of CAD status post circumflex obtuse marginal branch stenting back in 1997.  He was recently mated with shortness of breath on 07/15/2018 and underwent right left heart cath by Dr. and revealing a patent AV groove circumflex stent, first obtuse marginal branch stent in-stent restenosis as well as a moderate mid LAD lesion which was stented.  He is done well since.  He is on aspirin and Brilinta.

## 2018-08-13 NOTE — Assessment & Plan Note (Signed)
Discontinued 4 years ago.

## 2018-08-13 NOTE — Progress Notes (Signed)
08/13/2018 Jerry [name]   02/13/1951  [id number]  Primary Physician Aletha Halim., PA-C Primary Cardiologist: Lorretta Harp MD FACP, [employer], [place], [place]  HPI:  Jerry [name] is a 68 y.o.   moderately overweight widowed Caucasian male father of 70, grandfather and 2 grandchildren who has been disabled since 1999.  I last saw him in the office 10/23/2017.  He is referred by Jerry Reader, PA-C were revascularized evaluation and clearance for his upcoming left total knee replacement. He had seen Dr. Mare Ferrari in the past. He has a history discontinued tobacco abuse, treated hypertension and hyperlipidemia. He apparently had a circumflex obtuse marginal branch stent back in 1997.  He was admitted to [facility] on 07/15/2018 with increasing dyspnea on exertion and lower extreme edema.  He was diuresed.  2D echo revealed a moderate decline in LV function from 55 to 60% a year ago down to 40 to 45%.  He did undergo a right left heart cath by Dr. and revealing a patent AV groove circumflex stent, severe in-stent restenosis within the first obtuse marginal branch stent and moderate mid LAD disease which was stented.  He feels clinically improved.  Current Meds  Medication Sig  . aspirin EC 81 MG tablet Take 81 mg by mouth daily.  Marland Kitchen atorvastatin (LIPITOR) 80 MG tablet Take 1 tablet (80 mg total) by mouth daily at 6 PM.  . bisacodyl (DULCOLAX) 10 MG suppository Place 10 mg rectally as needed for moderate constipation.   . calcium carbonate (TUMS - DOSED IN MG ELEMENTAL CALCIUM) 500 MG chewable tablet Chew 1 tablet by mouth daily as needed for indigestion or heartburn.   . cholecalciferol (VITAMIN D) 1000 units tablet Take 1,000 Units by mouth daily.  Marland Kitchen escitalopram (LEXAPRO) 20 MG tablet Take 20 mg by mouth daily.  . ferrous sulfate 325 (65 FE) MG tablet Take 325 mg by mouth daily.  . furosemide (LASIX) 20 MG tablet Take 2 tablets (40 mg total) by mouth every other day.  .  losartan (COZAAR) 25 MG tablet Take 1 tablet (25 mg total) by mouth daily.  . metoprolol tartrate (LOPRESSOR) 25 MG tablet Take 0.5 tablets (12.5 mg total) by mouth 2 (two) times daily.  . Multiple Vitamin (MULTIVITAMIN WITH MINERALS) TABS tablet Take 1 tablet by mouth daily. FOR SENIORS  . naloxegol oxalate (MOVANTIK) 25 MG TABS tablet Take 25 mg by mouth daily after breakfast.   . nitroGLYCERIN (NITROSTAT) 0.4 MG SL tablet Place 0.4 mg under the tongue every 5 (five) minutes x 3 doses as needed for chest pain.  . nortriptyline (PAMELOR) 25 MG capsule Take 25 mg by mouth at bedtime.  . Oxycodone HCl 20 MG TABS Take 20 mg by mouth every 6 (six) hours as needed (pain).  . pantoprazole (PROTONIX) 40 MG tablet Take 1 tablet (40 mg total) by mouth 2 (two) times daily.  . polyethylene glycol (MIRALAX / GLYCOLAX) packet Take 17 g by mouth daily.  . potassium chloride (K-DUR,KLOR-CON) 10 MEQ tablet Take 1 tablet (10 mEq total) by mouth every other day.  . promethazine (PHENERGAN) 25 MG tablet Take 25 mg by mouth 3 (three) times daily as needed for nausea.   Marland Kitchen senna (SENOKOT) 8.6 MG TABS tablet Take 2 tablets by mouth 2 (two) times daily.  . silodosin (RAPAFLO) 8 MG CAPS capsule Take 1 capsule (8 mg total) by mouth daily with breakfast. (Patient taking differently: Take 8 mg by mouth daily after breakfast. )  .  SUMAtriptan (IMITREX) 100 MG tablet Take 100 mg by mouth every 2 (two) hours as needed for migraine. May repeat in 2 hours if headache persists or recurs.  . Tetrahydrozoline HCl (VISINE OP) Place 1 drop into both eyes daily as needed (dry eyes).  . ticagrelor (BRILINTA) 90 MG TABS tablet Take 1 tablet (90 mg total) by mouth 2 (two) times daily.  Marland Kitchen tiZANidine (ZANAFLEX) 4 MG tablet Take 4 mg by mouth every 6 (six) hours as needed for muscle spasms.  . traZODone (DESYREL) 150 MG tablet Take 300 mg by mouth at bedtime.   . VENTOLIN HFA 108 (90 Base) MCG/ACT inhaler Inhale 2 puffs into the lungs every  6 (six) hours as needed for shortness of breath.  . vitamin C (ASCORBIC ACID) 500 MG tablet Take 500 mg by mouth daily.     Allergies  Allergen Reactions  . Ciprofloxacin Anaphylaxis  . Penicillins Rash    Has patient had a PCN reaction causing immediate rash, facial/tongue/throat swelling, SOB or lightheadedness with hypotension:unsure Has patient had a PCN reaction causing severe rash involving mucus membranes or skin necrosis:unsure Has patient had a PCN reaction that required hospitalization:unsure Has patient had a PCN reaction occurring within the last 10 years:NO If all of the above answers are "NO", then may proceed with Cephalosporin use.   . Tramadol Hcl Rash    Social History   Socioeconomic History  . Marital status: Widowed    Spouse name: Not on file  . Number of children: Not on file  . Years of education: Not on file  . Highest education level: Not on file  Occupational History  . Occupation: Disabled   Social Needs  . Financial resource strain: Not on file  . Food insecurity:    Worry: Not on file    Inability: Not on file  . Transportation needs:    Medical: Not on file    Non-medical: Not on file  Tobacco Use  . Smoking status: Former Smoker    Packs/day: 0.25    Years: 33.00    Pack years: 8.25    Types: Cigarettes    Last attempt to quit: 05/05/2015    Years since quitting: 3.2  . Smokeless tobacco: Never Used  Substance and Sexual Activity  . Alcohol use: No    Alcohol/week: 0.0 standard drinks  . Drug use: No  . Sexual activity: Never  Lifestyle  . Physical activity:    Days per week: Not on file    Minutes per session: Not on file  . Stress: Not on file  Relationships  . Social connections:    Talks on phone: Not on file    Gets together: Not on file    Attends religious service: Not on file    Active member of club or organization: Not on file    Attends meetings of clubs or organizations: Not on file    Relationship status: Not on  file  . Intimate partner violence:    Fear of current or ex partner: Not on file    Emotionally abused: Not on file    Physically abused: Not on file    Forced sexual activity: Not on file  Other Topics Concern  . Not on file  Social History Narrative  . Not on file     Review of Systems: General: negative for chills, fever, night sweats or weight changes.  Cardiovascular: negative for chest pain, dyspnea on exertion, edema, orthopnea, palpitations, paroxysmal nocturnal dyspnea or  shortness of breath Dermatological: negative for rash Respiratory: negative for cough or wheezing Urologic: negative for hematuria Abdominal: negative for nausea, vomiting, diarrhea, bright red blood per rectum, melena, or hematemesis Neurologic: negative for visual changes, syncope, or dizziness All other systems reviewed and are otherwise negative except as noted above.    Blood pressure 134/74, pulse 75, height 6\' 2"  (1.88 m), weight 293 lb (132.9 kg).  General appearance: alert and no distress Neck: no adenopathy, no carotid bruit, no JVD, supple, symmetrical, trachea midline and thyroid not enlarged, symmetric, no tenderness/mass/nodules Lungs: clear to auscultation bilaterally Heart: regular rate and rhythm, S1, S2 normal, no murmur, click, rub or gallop Extremities: 1+ pitting edema bilaterally Pulses: 2+ and symmetric Skin: Skin color, texture, turgor normal. No rashes or lesions Neurologic: Alert and oriented X 3, normal strength and tone. Normal symmetric reflexes. Normal coordination and gait  EKG not performed today  ASSESSMENT AND PLAN:   CAD (coronary artery disease) History of CAD status post circumflex obtuse marginal branch stenting back in 1997.  He was recently mated with shortness of breath on 07/15/2018 and underwent right left heart cath by Dr. and revealing a patent AV groove circumflex stent, first obtuse marginal branch stent in-stent restenosis as well as a moderate mid LAD  lesion which was stented.  He is done well since.  He is on aspirin and Brilinta.  Edema Bilateral lower extremity edema probably related to diastolic dysfunction on daily diuretics.  He is aware of salt avoidance.  Tobacco abuse Discontinued 4 years ago.  Essential hypertension History of essential hypertension her blood pressure measured today 134/74.  He is on metoprolol to current meds at current dosing  HLD (hyperlipidemia) History of hyperlipidemia on atorvastatin with lipid profile performed 08/29/2017 revealing total cholesterol 142, LDL 70 and HDL 40.  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 Pine was admitted to [facility] on 07/15/2018 with volume overload and diastolic heart failure.  His echo did reveal moderate decrease in LV function compared to prior echoes with an EF of 40 to 45% compared to 55 to 60% 08/29/2017.  He was diuresed with improvement clinically.  He was discharged on daily oral diuretics.      Lorretta Harp MD FACP,FACC,FAHA, [facility] 08/13/2018 12:51 PM

## 2018-08-13 NOTE — Assessment & Plan Note (Signed)
History of hyperlipidemia on atorvastatin with lipid profile performed 08/29/2017 revealing total cholesterol 142, LDL 70 and HDL 40.

## 2018-08-13 NOTE — Patient Instructions (Signed)
Medication Instructions:  NO CHANGES If you need a refill on your cardiac medications before your next appointment, please call your pharmacy.   Lab work: NONE If you have labs (blood work) drawn today and your tests are completely normal, you will receive your results only by: . MyChart Message (if you have MyChart) OR . A paper copy in the mail If you have any lab test that is abnormal or we need to change your treatment, we will call you to review the results.  Testing/Procedures: NONE  Follow-Up: At [facility], you and your health needs are our priority.  As part of our continuing mission to provide you with exceptional heart care, we have created designated Provider Care Teams.  These Care Teams include your primary Cardiologist (physician) and Advanced Practice Providers (APPs -  Physician Assistants and Nurse Practitioners) who all work together to provide you with the care you need, when you need it. . You will need a follow up appointment in 3 months with an APP and 12 months with Dr. Berry.  Please call our office 2 months in advance to schedule your appointment with Dr. Berry.  You may see Dr. Berry or one of the following Advanced Practice Providers on your designated Care Team:   . Luke Kilroy, PA-C . Hao Meng, PA-C . Angela Duke, PA-C . Kathryn Lawrence, DNP . Rhonda Barrett, PA-C . Krista Kroeger, PA-C . Callie Goodrich, PA-C   

## 2018-08-19 ENCOUNTER — Telehealth: Payer: Self-pay | Admitting: *Deleted

## 2018-08-19 ENCOUNTER — Encounter: Payer: Self-pay | Admitting: *Deleted

## 2018-08-19 NOTE — Telephone Encounter (Addendum)
Patient had a sleep study ordered 07/21/18 by (APP)  Angelena Form. The primary cardiologist is Dr Gwenlyn Found. There is no supporting documentation in patients chart for the sleep study. Sleep study referral can be re-visited at patients next visit with Dr Gwenlyn Found or one of the other APP's on the designated care team.

## 2018-08-19 NOTE — Telephone Encounter (Signed)
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 I didn't actually see the patient. I think I may have placed an order forher.  ----- Message ----- From: Freada Bergeron, CMA Sent: 08/19/2018  11:38 AM EST To: Eileen Stanford, PA-C Subject: sleep study                                    Eulogio Bear, do you remember  ordering a sleep study on this patient? Thanks, Gae Bon

## 2018-08-19 NOTE — Telephone Encounter (Signed)
This encounter was created in error - please disregard.

## 2018-08-21 ENCOUNTER — Other Ambulatory Visit: Payer: Self-pay | Admitting: Cardiovascular Disease

## 2018-08-21 MED ORDER — TICAGRELOR 90 MG PO TABS: 90.0000 mg | ORAL_TABLET | Freq: Two times a day (BID) | ORAL | 0 refills | Status: DC

## 2018-08-21 MED ORDER — ATORVASTATIN CALCIUM 80 MG PO TABS: 80.0000 mg | ORAL_TABLET | Freq: Every day | ORAL | 0 refills | Status: DC

## 2018-08-21 MED ORDER — LOSARTAN POTASSIUM 25 MG PO TABS: 25.0000 mg | ORAL_TABLET | Freq: Every day | ORAL | 0 refills | Status: DC

## 2018-08-21 NOTE — Telephone Encounter (Signed)
New Message    *STAT* If patient is at the pharmacy, call can be transferred to refill team.   1. Which medications need to be refilled? (please list name of each medication and dose if known) atorvastatin (LIPITOR) 80 MG tablet, ticagrelor (BRILINTA) 90 MG TABS tablet and losartan (COZAAR) 25 MG tablet  2. Which pharmacy/location (including street and city if local pharmacy) is medication to be sent to? [id number] - [place], [place] [address] AT [address]  3. Do they need a 30 day or 90 day supply? [place]

## 2018-08-21 NOTE — Telephone Encounter (Signed)
Rx request sent to pharmacy.  

## 2018-08-28 ENCOUNTER — Telehealth (HOSPITAL_COMMUNITY): Payer: Self-pay

## 2018-08-28 NOTE — Telephone Encounter (Signed)
Pt insurance is active and benefits verified through [facility]. Co-pay $10.00, DED $0.00/$0.00 met, out of pocket $3,400.00/$45.00 met, co-insurance 0%. No pre-authorization required. Passport, 08/28/2018 @ 11:19AM, REF# [id number]

## 2018-09-12 ENCOUNTER — Other Ambulatory Visit: Payer: Self-pay | Admitting: Cardiovascular Disease

## 2018-09-15 ENCOUNTER — Encounter (HOSPITAL_COMMUNITY): Payer: Self-pay

## 2018-09-15 NOTE — Telephone Encounter (Signed)
Attempted to call patient in regards to Cardiac Rehab - pt voicemail is not set up, unable to leave voicemail  Mailed letter

## 2018-09-17 ENCOUNTER — Other Ambulatory Visit: Payer: Self-pay | Admitting: Cardiovascular Disease

## 2018-10-11 ENCOUNTER — Other Ambulatory Visit: Payer: Self-pay | Admitting: Cardiovascular Disease

## 2018-10-13 ENCOUNTER — Telehealth (HOSPITAL_COMMUNITY): Payer: Self-pay

## 2018-10-13 NOTE — Telephone Encounter (Signed)
2nd attempted to contact pt in regards to CR, unable to leave voicemail

## 2018-10-23 ENCOUNTER — Telehealth (HOSPITAL_COMMUNITY): Payer: Self-pay

## 2018-10-23 NOTE — Telephone Encounter (Signed)
Attempted to call patient in regards to Cardiac Rehab - to let pt know we are closed at this time due to the COVID-19 and will contact once we have resume scheduling.   Unable to leave voicemail.

## 2018-11-12 ENCOUNTER — Telehealth: Payer: Self-pay

## 2018-11-12 NOTE — Telephone Encounter (Signed)
4-16 @11am  No VM will have to try again later

## 2018-11-13 ENCOUNTER — Telehealth (HOSPITAL_COMMUNITY): Payer: Self-pay | Admitting: *Deleted

## 2018-11-13 NOTE — Telephone Encounter (Signed)
Appt 4-16 @11am 

## 2018-11-13 NOTE — Telephone Encounter (Signed)
Unable to leave message.  Pt mailed letter to please contact Cardiac rehab and brochure provided with additional information about cardiac rehab. Cherre Huger, BSN Cardiac and Training and development officer

## 2018-11-14 ENCOUNTER — Telehealth: Payer: Self-pay | Admitting: Adult Health

## 2018-11-14 NOTE — Telephone Encounter (Signed)
Unable to reach pt and unable to leave vm. Attempted to call for visit pre-call prior to appt with Jory Sims, NP on 4/16 at 11:00 AM.

## 2018-11-17 NOTE — Telephone Encounter (Signed)
Unable to contact pt, phone just rings then hangs up.

## 2018-11-18 NOTE — Telephone Encounter (Signed)
Returned call to pt he states that he also needs pre-op clearance/medication clarance along with his f/u appointment on 4-16. He sates that he is going to have a procedure to replace hs morphine pump it is going out and needs a new one, he will need to stop his brilinta for 5 days prior. He states that he will have them fax over the details since they have not done so already. he has Bp, scale and o2 sat. consent for televisit obtained. BP YES  scale  YES  MyChart NO  email NO    In the setting of the current Covid19 crisis, you are scheduled for a TELEPHONE visit with your provider on 11-20-2018 at 11AM.  Just as we do with many in-office visits, in order for you to participate in this visit, we must obtain consent.  If you'd like, I can send this to your mychart (if signed up) or email for you to review.  Otherwise, I can obtain your verbal consent now.  All virtual visits are billed to your insurance company just like a normal visit would be.  By agreeing to a virtual visit, we'd like you to understand that the technology does not allow for your provider to perform an examination, and thus may limit your provider's ability to fully assess your condition.  Finally, though the technology is pretty good, we cannot assure that it will always work on either your or our end, and in the setting of a video visit, we may have to convert it to a phone-only visit.  In either situation, we cannot ensure that we have a secure connection.  Are you willing to proceed?  Verbalized consent.
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
## 2018-11-20 ENCOUNTER — Ambulatory Visit: Payer: Medicare HMO | Admitting: Adult Health

## 2018-11-20 ENCOUNTER — Telehealth (INDEPENDENT_AMBULATORY_CARE_PROVIDER_SITE_OTHER): Payer: Medicare HMO | Admitting: Adult Health

## 2018-11-20 VITALS — BP 139/75 | HR 83 | Ht 74.0 in | Wt 279.1 lb

## 2018-11-20 DIAGNOSIS — Z01818 Encounter for other preprocedural examination: Secondary | ICD-10-CM

## 2018-11-20 DIAGNOSIS — I2510 Atherosclerotic heart disease of native coronary artery without angina pectoris: Secondary | ICD-10-CM

## 2018-11-20 DIAGNOSIS — E7800 Pure hypercholesterolemia, unspecified: Secondary | ICD-10-CM

## 2018-11-20 DIAGNOSIS — I10 Essential (primary) hypertension: Secondary | ICD-10-CM

## 2018-11-20 NOTE — Patient Instructions (Signed)
Medication Instructions:  OK TO HOLD BRILINTA AND ASPIRIN 81MG  5 DAYS PRIOR TO ITP PROCEDURE. CLEARED FOR PROCEDURE WITH Dr Cleon Gustin, MD, PhD If you need a refill on your cardiac medications before your next appointment, please call your pharmacy.  Follow-Up: You will need a follow up appointment in 4 months.  Please call our office 2 months in advance,JUNE 2020, to schedule this, AUGUST 2020, appointment.  You may see Quay Burow, MD , Jory Sims, DNP, AACC  or one of the following Advanced Practice Providers on your designated Care Team:  Kerin Ransom, PA-C Roby Lofts, PA-C  Sande Rives, [place]      At [facility], you and your health needs are our priority.  As part of our continuing mission to provide you with exceptional heart care, we have created designated Provider Care Teams.  These Care Teams include your primary Cardiologist (physician) and Advanced Practice Providers (APPs -  Physician Assistants and Nurse Practitioners) who all work together to provide you with the care you need, when you need it.  Thank you for choosing [facility] at [facility]!!

## 2018-12-12 ENCOUNTER — Telehealth: Payer: Self-pay | Admitting: Cardiovascular Disease

## 2018-12-12 NOTE — Telephone Encounter (Signed)
New Message    Pt is calling and said he needs a morphine pump to be replaced    Please call

## 2018-12-12 NOTE — Telephone Encounter (Addendum)
Returned the pt call. Unable to lmom. Pt voicemail is not set up. Pt will need to contact the prescriber of his morphine.

## 2018-12-16 NOTE — Telephone Encounter (Signed)
WAS ABLE TO REACH PT VIA HOME NUMBER AND PT STATES NEED OKAY TO PROCEED WITH PROCEDURE OFFICE NOTE FROM 11/20/18  WITH KATHRYN LAWRENCE FAXED TO DR Jerilee Hoh NUMBER [phone]

## 2019-01-12 ENCOUNTER — Other Ambulatory Visit: Payer: Self-pay | Admitting: Cardiovascular Disease

## 2019-02-03 ENCOUNTER — Ambulatory Visit (INDEPENDENT_AMBULATORY_CARE_PROVIDER_SITE_OTHER): Payer: Medicare HMO | Admitting: Internal Medicine

## 2019-02-03 ENCOUNTER — Encounter: Payer: Self-pay | Admitting: Internal Medicine

## 2019-02-03 ENCOUNTER — Ambulatory Visit (INDEPENDENT_AMBULATORY_CARE_PROVIDER_SITE_OTHER): Payer: Medicare HMO

## 2019-02-03 ENCOUNTER — Other Ambulatory Visit: Payer: Self-pay

## 2019-02-03 DIAGNOSIS — T502X5A Adverse effect of carbonic-anhydrase inhibitors, benzothiadiazides and other diuretics, initial encounter: Secondary | ICD-10-CM

## 2019-02-03 DIAGNOSIS — R0609 Other forms of dyspnea: Secondary | ICD-10-CM

## 2019-02-03 DIAGNOSIS — E876 Hypokalemia: Secondary | ICD-10-CM

## 2019-02-03 NOTE — Patient Instructions (Addendum)
GERD (REFLUX)  is an extremely common cause of respiratory symptoms just like yours , many times with no obvious heartburn at all.    It can be treated with medication, but also with lifestyle changes including elevation of the head of your bed (ideally with 6 -8inch blocks under the headboard of your bed),  Smoking cessation, avoidance of late meals, excessive alcohol, and avoid fatty foods, chocolate, peppermint, colas, red wine, and acidic juices such as orange juice.  NO MINT OR MENTHOL PRODUCTS SO NO COUGH DROPS  USE SUGARLESS CANDY INSTEAD (Jolley ranchers or Stover's or Life Savers) or even ice chips will also do - the key is to swallow to prevent all throat clearing. NO OIL BASED VITAMINS - use powdered substitutes.  Avoid fish oil when coughing.    Please remember to go to the lab and x-ray department   for your tests - we will call you with the results when they are available.   Please schedule a follow up office visit in 2 weeks, sooner if needed  with all medications /inhalers/ solutions in hand so we can verify exactly what you are taking. This includes all medications from all doctors and over the counters - add recheck K if not done by f/u ov
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
## 2019-02-04 LAB — CBC WITH DIFFERENTIAL/PLATELET
Basophils Absolute: 0.1 10*3/uL (ref 0.0–0.1)
Basophils Relative: 0.9 % (ref 0.0–3.0)
Eosinophils Absolute: 0.5 10*3/uL (ref 0.0–0.7)
Eosinophils Relative: 3.9 % (ref 0.0–5.0)
HCT: 39.5 % (ref 39.0–52.0)
Hemoglobin: 12.7 g/dL — ABNORMAL LOW (ref 13.0–17.0)
Lymphocytes Relative: 27 % (ref 12.0–46.0)
Lymphs Abs: 3.3 10*3/uL (ref 0.7–4.0)
MCHC: 32.3 g/dL (ref 30.0–36.0)
MCV: 89.1 fl (ref 78.0–100.0)
Monocytes Absolute: 0.7 10*3/uL (ref 0.1–1.0)
Monocytes Relative: 5.8 % (ref 3.0–12.0)
Neutro Abs: 7.7 10*3/uL (ref 1.4–7.7)
Neutrophils Relative %: 62.4 % (ref 43.0–77.0)
Platelets: 366 10*3/uL (ref 150.0–400.0)
RBC: 4.44 Mil/uL (ref 4.22–5.81)
RDW: 15.2 % (ref 11.5–15.5)
WBC: 12.3 10*3/uL — ABNORMAL HIGH (ref 4.0–10.5)

## 2019-02-04 LAB — TSH: TSH: 2.75 u[IU]/mL (ref 0.35–4.50)

## 2019-02-04 LAB — BASIC METABOLIC PANEL
BUN: 9 mg/dL (ref 6–23)
CO2: 30 mEq/L (ref 19–32)
Calcium: 9.3 mg/dL (ref 8.4–10.5)
Chloride: 94 mEq/L — ABNORMAL LOW (ref 96–112)
Creatinine, Ser: 1.07 mg/dL (ref 0.40–1.50)
GFR: 68.81 mL/min (ref >60.00)
Glucose, Bld: 118 mg/dL — ABNORMAL HIGH (ref 70–99)
Potassium: 3.1 mEq/L — ABNORMAL LOW (ref 3.5–5.1)
Sodium: 139 mEq/L (ref 135–145)

## 2019-02-04 LAB — BRAIN NATRIURETIC PEPTIDE: Pro B Natriuretic peptide (BNP): 66 pg/mL (ref 0.0–100.0)

## 2019-02-04 NOTE — Progress Notes (Signed)
Spoke with pt and notified of results per Dr. Wert. Pt verbalized understanding and denied any questions. 

## 2019-02-05 ENCOUNTER — Encounter: Payer: Self-pay | Admitting: Internal Medicine

## 2019-02-05 DIAGNOSIS — E876 Hypokalemia: Secondary | ICD-10-CM | POA: Insufficient documentation

## 2019-02-05 DIAGNOSIS — T502X5A Adverse effect of carbonic-anhydrase inhibitors, benzothiadiazides and other diuretics, initial encounter: Secondary | ICD-10-CM | POA: Insufficient documentation

## 2019-02-05 NOTE — Assessment & Plan Note (Addendum)
Onset nov 2019  - PFT's preop  For cw resection @ wt 292 =   FEV1 2.64 (75 % ) ratio 0.77  p 2 % improvement from saba p 0 prior to study with DLCO  88 % corrects to 115 % for alv volume  With ERV 46%  - LHC/RHC 07/2018 Normal left and right heart filling pressures as well as Fick cardiac output. - 02/03/2019   Walked RA x one lap =  approx 250 ft - stopped due to  Sob multiple times with sats still > 95% at all times   Symptoms are   disproportionate to objective findings and not clear to what extent this is actually a pulmonary  problem but pt does appear to have difficult to sort out respiratory symptoms of unknown origin for which  DDX  = almost all start with A and  include Adherence, Ace Inhibitors, Acid Reflux, Active Sinus Disease, Alpha 1 Antitripsin deficiency, Anxiety masquerading as Airways dz,  ABPA,  Allergy(esp in young), Aspiration (esp in elderly), Adverse effects of meds,  Active smoking or Vaping, A bunch of PE's/clot burden (a few small clots can't cause this syndrome unless there is already severe underlying pulm or vascular dz with poor reserve),  Anemia or thyroid disorder, plus two Bs  = Bronchiectasis and Beta blocker use..and one C= CHF   Adherence is always the initial "prime suspect" and is a multilayered concern that requires a "trust but verify" approach in every patient - starting with knowing how to use medications, especially inhalers, correctly, keeping up with refills and understanding the fundamental difference between maintenance and prns vs those medications only taken for a very short course and then stopped and not refilled.  - return with all meds in hand using a trust but verify approach to confirm accurate Medication  Reconciliation The principal here is that until we are certain that the  patients are doing what we've asked, it makes no sense to ask them to do more.   ? Acid (or non-acid) GERD > always difficult to exclude as up to 75% of pts in some series  report no assoc GI/ Heartburn symptoms> rec max (24h)  acid suppression and diet restrictions/ reviewed and instructions given in writing.   ? Adverse drug effects > none of the usual suspects listed  ? Anxiety/depression/ deconditioning  > usually at the bottom of this list of usual suspects but should be included r on this pt's based on H and P and note already on psychotropics and may interfere with adherence and also interpretation of response or lack thereof to symptom management which can be quite subjective.   ? A bunch of pe's > D dimer   high normal value (seen commonly in the elderly or chronically ill)  may miss small peripheral pe, the clot burden with sob is moderately high and the d dimer  has a very high neg pred value if used in this setting.    ? Allergy/asthma/copd:   High Eos noted but  NB When respiratory symptoms begin or become refractory well after a patient reports complete smoking cessation,  Especially when this wasn't the case while they were smoking, a red flag is raised based on the work of Dr Kris Mouton which states:  if you quit smoking when your best day FEV1 is still well preserved it is highly unlikely you will progress to severe disease.  That is to say, once the smoking stops,  the symptoms should not suddenly  erupt or markedly worsen.  If so, the differential diagnosis should include  obesity/deconditioning,  LPR/Reflux/Aspiration syndromes,  occult CHF, or  especially side effect of medications commonly used in this population,  But there is no evidence of asthma or copd here.  ? BB effects > unlikely on such low doses of lopressor  ? chf  > excluded by bnp so low with active symptoms on day of study   >>> f/u in 2 weeks to regroup     Total time devoted to counseling  > 50 % of initial 60 min office visit:  reviewed case with pt/ directly observed portions of ambulatory 02 saturation study/  discussion of options/alternatives/ personally creating written  customized instructions  in presence of pt  then going over those specific  Instructions directly with the pt including how to use all of the meds but in particular covering each new medication in detail and the difference between the maintenance= "automatic" meds and the prns using an action plan format for the latter (If this problem/symptom => do that organization reading Left to right).  Please see AVS from this visit for a full list of these instructions which I personally wrote for this pt and  are unique to this visit.

## 2019-02-05 NOTE — Assessment & Plan Note (Signed)
rec double current dose of Kdur and f/u with pcp or cards or here in 2 weeks to recheck doe whichever comes first

## 2019-02-05 NOTE — Assessment & Plan Note (Signed)
Body mass index is 37.03 kg/m.   Lab Results  Component Value Date   TSH 2.75 02/03/2019     Contributing to gerd risk/ doe/reviewed the need and the process to achieve and maintain neg calorie balance > defer f/u primary care including intermittently monitoring thyroid status

## 2019-02-11 LAB — ALPHA-1 ANTITRYPSIN PHENOTYPE: A-1 Antitrypsin, Ser: 193 mg/dL (ref 83–199)

## 2019-02-11 LAB — D-DIMER, QUANTITATIVE: D-Dimer, Quant: 0.79 mcg/mL FEU — ABNORMAL HIGH (ref <0.50)

## 2019-03-09 ENCOUNTER — Other Ambulatory Visit: Payer: Self-pay

## 2019-03-09 ENCOUNTER — Ambulatory Visit (INDEPENDENT_AMBULATORY_CARE_PROVIDER_SITE_OTHER): Payer: Medicare HMO | Admitting: Internal Medicine

## 2019-03-09 ENCOUNTER — Encounter: Payer: Self-pay | Admitting: Internal Medicine

## 2019-03-09 DIAGNOSIS — R0609 Other forms of dyspnea: Secondary | ICD-10-CM | POA: Diagnosis not present
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
## 2019-03-09 NOTE — Patient Instructions (Addendum)
Sit down with our list and your meds to make sure they correlate 100% and call me with any discrepancy  Protonix 40 mg Take 30- 60 min before your first and last meals of the day   GERD (REFLUX)  is an extremely common cause of respiratory symptoms just like yours , many times with no obvious heartburn at all.    It can be treated with medication, but also with lifestyle changes including elevation of the head of your bed (ideally with 6 -8inch blocks under the headboard of your bed),  Smoking cessation, avoidance of late meals, excessive alcohol, and avoid fatty foods, chocolate, peppermint, colas, red wine, and acidic juices such as orange juice.  NO MINT OR MENTHOL PRODUCTS SO NO COUGH DROPS  USE SUGARLESS CANDY INSTEAD (Jolley ranchers or Stover's or Life Savers) or even ice chips will also do - the key is to swallow to prevent all throat clearing. NO OIL BASED VITAMINS - use powdered substitutes.  Avoid fish oil when coughing.  Try exerting 15 min after using your inhaler x 2 puffs   Please schedule a follow up office visit in 6 weeks, call sooner if needed with all your meds for PFTs on return

## 2019-03-10 ENCOUNTER — Encounter: Payer: Self-pay | Admitting: Internal Medicine

## 2019-03-10 NOTE — Assessment & Plan Note (Addendum)
Onset nov 2019  - PFT's preop  For cw resection 2016  @ wt 292 =   FEV1 2.64 (75 % ) ratio 0.77  p 2 % improvement from saba p 0 prior to study with DLCO  88 % corrects to 115 % for alv volume  With ERV 46%  - LHC/RHC 07/2018 Normal left and right heart filling pressures as well as Fick cardiac output. - 02/03/2019   Walked RA x one lap =  approx 250 ft - stopped due to  Sob multiple times with sats still > 95% at all times  - 02/03/2019  Alpha one AT screen   Level 193 MM - 03/09/2019   Walked RA x one lap =  approx 250 ft - stopped due to sob with sats around 92%  Not clear saba helping at this point I spent extra time with pt today reviewing appropriate use of albuterol for prn use on exertion with the following points: 1) saba is for relief of sob that does not improve by walking a slower pace or resting but rather if the pt does not improve after trying this first. 2) If the pt is convinced, as many are, that saba helps recover from activity faster then it's easy to tell if this is the case by re-challenging : ie stop, take the inhaler, then p 5 minutes try the exact same activity (intensity of workload) that just caused the symptoms and see if they are substantially diminished or not after saba 3) if there is an activity that reproducibly causes the symptoms, try the saba 15 min before the activity on alternate days   If in fact the saba really does help, then fine to continue to use it prn but advised may need to look closer at the maintenance regimen being used to achieve better control of airways disease with exertion.    So far have not been able to find any problem other than obesity/ deconditioning and I since did not have airflow obst on prior eval not likely to be present now nor significant ILD s desats > needs aggressive rx for gerd and f/u with full pfts next with all meds in hand using a trust but verify approach to confirm accurate Medication  Reconciliation The principal here is that  until we are certain that the  patients are doing what we've asked, it makes no sense to ask them to do more.     I had an extended discussion with the patient  reviewing all relevant studies completed to date and  lasting 15 to 20 minutes of a 25 minute visit  which included directly observing ambulatory 02 saturation study documented in a/p section of  today's  office note.  Each maintenance medication was reviewed in detail including most importantly the difference between maintenance and prns and under what circumstances the prns are to be triggered using an action plan format that is not reflected in the computer generated alphabetically organized AVS.     Please see AVS for specific instructions unique to this visit that I personally wrote and verbalized to the the pt in detail and then reviewed with pt  by my nurse highlighting any changes in therapy recommended at today's visit .

## 2019-04-02 ENCOUNTER — Other Ambulatory Visit: Payer: Self-pay | Admitting: Thoracic Surgery (Cardiothoracic Vascular Surgery)

## 2019-04-03 ENCOUNTER — Other Ambulatory Visit: Payer: Self-pay | Admitting: Thoracic Surgery (Cardiothoracic Vascular Surgery)

## 2019-04-03 DIAGNOSIS — C493 Malignant neoplasm of connective and soft tissue of thorax: Secondary | ICD-10-CM

## 2019-04-20 ENCOUNTER — Ambulatory Visit: Payer: Medicare HMO | Admitting: Internal Medicine

## 2019-04-21 ENCOUNTER — Ambulatory Visit (INDEPENDENT_AMBULATORY_CARE_PROVIDER_SITE_OTHER): Payer: Medicare HMO | Admitting: Cardiovascular Disease

## 2019-04-21 ENCOUNTER — Encounter: Payer: Self-pay | Admitting: Cardiovascular Disease

## 2019-04-21 ENCOUNTER — Other Ambulatory Visit: Payer: Self-pay

## 2019-04-21 DIAGNOSIS — F17210 Nicotine dependence, cigarettes, uncomplicated: Secondary | ICD-10-CM | POA: Diagnosis not present

## 2019-04-21 DIAGNOSIS — I2510 Atherosclerotic heart disease of native coronary artery without angina pectoris: Secondary | ICD-10-CM | POA: Diagnosis not present

## 2019-04-21 DIAGNOSIS — I5032 Chronic diastolic (congestive) heart failure: Secondary | ICD-10-CM

## 2019-04-21 DIAGNOSIS — I421 Obstructive hypertrophic cardiomyopathy: Secondary | ICD-10-CM

## 2019-04-21 DIAGNOSIS — I10 Essential (primary) hypertension: Secondary | ICD-10-CM

## 2019-04-21 DIAGNOSIS — E782 Mixed hyperlipidemia: Secondary | ICD-10-CM

## 2019-04-21 NOTE — Assessment & Plan Note (Signed)
History of discontinued tobacco abuse 4 years ago.

## 2019-04-21 NOTE — Assessment & Plan Note (Signed)
Chronic diastolic heart failure with an EF in the 45% range taking furosemide 40 mg every other day and extra as needed.

## 2019-04-21 NOTE — Assessment & Plan Note (Signed)
History of essential hypertension blood pressure measured at 114/62.  He is on losartan and metoprolol.

## 2019-04-21 NOTE — Assessment & Plan Note (Signed)
History of CAD status post marginal branch stenting back in 1997.  He had cath performed by Dr. Saunders Revel 07/17/2018 revealing a patent circumflex obtuse marginal branch stent with a physiologically significant mid LAD lesion which was stented.  Since that time is felt clinically improved.

## 2019-04-21 NOTE — Progress Notes (Signed)
04/21/2019 VINAYAK AWWAD   1950/08/21  [id number]  Primary Physician Aletha Halim., PA-C Primary Cardiologist: Lorretta Harp MD FACP, [place], [place], [place]  HPI:  Jerry [name] is a 68 y.o.  moderately overweight widowed Caucasian male father of 49, grandfather and 2 grandchildren who has been disabled since 1999.  I last saw him in the office 08/13/2018.  He is referred by Leonia Reader, PA-C were revascularized evaluation and clearance for his upcoming left total knee replacement. He had seen Dr. Mare Ferrari in the past. He has a history discontinued tobacco abuse, treated hypertension and hyperlipidemia. He apparently had a circumflex obtuse marginal branch stent back in 1997.  He was [facility] to [facility] on 07/15/2018 with increasing dyspnea on exertion and lower extreme edema.  He was diuresed.  2D echo revealed a moderate decline in LV function from 55 to 60% a year ago down to 40 to 45%.  He did undergo a right left heart cath by Dr. Saunders Revel revealing a patent AV groove circumflex stent, severe in-stent restenosis within the first obtuse marginal branch stent and moderate mid LAD disease which was stented.  He feels clinically improved.  Since I saw him 8 months ago he is remained stable.  He is in a wheelchair because he says his left leg is unstable.  He sees Dr. Melvyn Novas for his pulmonary issues.  He denies chest pain.   Current Meds  Medication Sig  . aspirin EC 81 MG tablet Take 81 mg by mouth daily.  Marland Kitchen atorvastatin (LIPITOR) 80 MG tablet TAKE 1 TABLET (80 MG TOTAL) BY MOUTH DAILY AT 6 PM.  . bisacodyl (DULCOLAX) 10 MG suppository Place 10 mg rectally as needed for moderate constipation.   . BRILINTA 90 MG TABS tablet TAKE 1 TABLET (90 MG TOTAL) BY MOUTH 2 (TWO) TIMES DAILY.  . calcium carbonate (TUMS - DOSED IN MG ELEMENTAL CALCIUM) 500 MG chewable tablet Chew 1 tablet by mouth daily as needed for indigestion or heartburn.   . cholecalciferol (VITAMIN D) 1000 units  tablet Take 1,000 Units by mouth daily.  Marland Kitchen escitalopram (LEXAPRO) 20 MG tablet Take 20 mg by mouth daily.  . ferrous sulfate 325 (65 FE) MG tablet Take 325 mg by mouth daily.  . furosemide (LASIX) 20 MG tablet Take 2 tablets (40 mg total) by mouth every other day. (Patient taking differently: Take 40 mg by mouth daily. 1 extra as needed)  . HYDROmorphone (DILAUDID) 8 MG tablet Take 8 mg by mouth every 8 (eight) hours as needed for severe pain.  Marland Kitchen losartan (COZAAR) 25 MG tablet TAKE 1 TABLET BY MOUTH EVERY DAY  . metoprolol tartrate (LOPRESSOR) 25 MG tablet Take 0.5 tablets (12.5 mg total) by mouth 2 (two) times daily.  . Multiple Vitamin (MULTIVITAMIN WITH MINERALS) TABS tablet Take 1 tablet by mouth daily. FOR SENIORS  . nitroGLYCERIN (NITROSTAT) 0.4 MG SL tablet Place 0.4 mg under the tongue every 5 (five) minutes x 3 doses as needed for chest pain.  . nortriptyline (PAMELOR) 10 MG capsule Take 40 mg by mouth at bedtime.  . pantoprazole (PROTONIX) 40 MG tablet Take 1 tablet (40 mg total) by mouth 2 (two) times daily.  . polyethylene glycol (MIRALAX / GLYCOLAX) packet Take 17 g by mouth daily.  . potassium chloride (K-DUR,KLOR-CON) 10 MEQ tablet Take 1 tablet (10 mEq total) by mouth every other day.  . promethazine (PHENERGAN) 25 MG tablet Take 25 mg by mouth 3 (three)  times daily as needed for nausea.   Marland Kitchen senna (SENOKOT) 8.6 MG TABS tablet Take 2 tablets by mouth 2 (two) times daily.  . silodosin (RAPAFLO) 8 MG CAPS capsule Take 1 capsule (8 mg total) by mouth daily with breakfast. (Patient taking differently: Take 8 mg by mouth daily after breakfast. )  . SUMAtriptan (IMITREX) 100 MG tablet Take 100 mg by mouth every 2 (two) hours as needed for migraine. May repeat in 2 hours if headache persists or recurs.  . Tetrahydrozoline HCl (VISINE OP) Place 1 drop into both eyes daily as needed (dry eyes).  Marland Kitchen tiZANidine (ZANAFLEX) 4 MG tablet Take 4 mg by mouth every 6 (six) hours as needed for muscle  spasms.  . traZODone (DESYREL) 150 MG tablet Take 300 mg by mouth at bedtime.   . VENTOLIN HFA 108 (90 Base) MCG/ACT inhaler Inhale 2 puffs into the lungs every 6 (six) hours as needed for shortness of breath.  . vitamin C (ASCORBIC ACID) 500 MG tablet Take 500 mg by mouth daily.  . [DISCONTINUED] naloxegol oxalate (MOVANTIK) 25 MG TABS tablet Take 25 mg by mouth daily after breakfast.   . [DISCONTINUED] nortriptyline (PAMELOR) 25 MG capsule Take 25 mg by mouth at bedtime.     Allergies  Allergen Reactions  . Ciprofloxacin Anaphylaxis  . Penicillins Rash    Has patient had a PCN reaction causing immediate rash, facial/tongue/throat swelling, SOB or lightheadedness with hypotension:unsure Has patient had a PCN reaction causing severe rash involving mucus membranes or skin necrosis:unsure Has patient had a PCN reaction that required hospitalization:unsure Has patient had a PCN reaction occurring within the last 10 years:NO If all of the above answers are "NO", then may proceed with Cephalosporin use.   . Tramadol Hcl Rash    Social History   Socioeconomic History  . Marital status: Widowed    Spouse name: Not on file  . Number of children: Not on file  . Years of education: Not on file  . Highest education level: Not on file  Occupational History  . Occupation: Disabled   Social Needs  . Financial resource strain: Not on file  . Food insecurity    Worry: Not on file    Inability: Not on file  . Transportation needs    Medical: Not on file    Non-medical: Not on file  Tobacco Use  . Smoking status: Former Smoker    Packs/day: 0.25    Years: 33.00    Pack years: 8.25    Types: Cigarettes    Quit date: 05/05/2015    Years since quitting: 3.9  . Smokeless tobacco: Never Used  Substance and Sexual Activity  . Alcohol use: No    Alcohol/week: 0.0 standard drinks  . Drug use: No  . Sexual activity: Never  Lifestyle  . Physical activity    Days per week: Not on file     Minutes per session: Not on file  . Stress: Not on file  Relationships  . Social Herbalist on phone: Not on file    Gets together: Not on file    Attends religious service: Not on file    Active member of club or organization: Not on file    Attends meetings of clubs or organizations: Not on file    Relationship status: Not on file  . Intimate partner violence    Fear of current or ex partner: Not on file    Emotionally abused: Not on  file    Physically abused: Not on file    Forced sexual activity: Not on file  Other Topics Concern  . Not on file  Social History Narrative  . Not on file     Review of Systems: General: negative for chills, fever, night sweats or weight changes.  Cardiovascular: negative for chest pain, dyspnea on exertion, edema, orthopnea, palpitations, paroxysmal nocturnal dyspnea or shortness of breath Dermatological: negative for rash Respiratory: negative for cough or wheezing Urologic: negative for hematuria Abdominal: negative for nausea, vomiting, diarrhea, bright red blood per rectum, melena, or hematemesis Neurologic: negative for visual changes, syncope, or dizziness All other systems reviewed and are otherwise negative except as noted above.    Blood pressure 114/62, pulse 72, temperature (!) 97.5 F (36.4 C), height 6\' 2"  (1.88 m), weight 288 lb (130.6 kg).    EKG sinus rhythm at 72 with nonspecific ST and T wave changes.  Personally reviewed this EKG.  ASSESSMENT AND PLAN:   CAD (coronary artery disease) History of CAD status post marginal branch stenting back in 1997.  He had cath performed by Dr. Saunders Revel 07/17/2018 revealing a patent circumflex obtuse marginal branch stent with a physiologically significant mid LAD lesion which was stented.  Since that time is felt clinically improved.  Cigarette smoker History of discontinued tobacco abuse 4 years ago.  Essential hypertension History of essential hypertension blood pressure  measured at 114/62.  He is on losartan and metoprolol.  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      Lorretta Harp MD FACP,FACC,FAHA, [facility] 04/21/2019 2:12 PM

## 2019-04-21 NOTE — Assessment & Plan Note (Signed)
History of hyperlipidemia on statin therapy with lipid profile performed 08/29/2017 revealing total cholesterol 142, LDL 70 and HDL 40

## 2019-04-21 NOTE — Patient Instructions (Addendum)
Medication Instructions:  Your physician recommends that you continue on your current medications as directed. Please refer to the Current Medication list given to you today.  If you need a refill on your cardiac medications before your next appointment, please call your pharmacy.   Lab work: NONE If you have labs (blood work) drawn today and your tests are completely normal, you will receive your results only by: . MyChart Message (if you have MyChart) OR . A paper copy in the mail If you have any lab test that is abnormal or we need to change your treatment, we will call you to review the results.  Testing/Procedures: NONE  Follow-Up: At [facility], you and your health needs are our priority.  As part of our continuing mission to provide you with exceptional heart care, we have created designated Provider Care Teams.  These Care Teams include your primary Cardiologist (physician) and Advanced Practice Providers (APPs -  Physician Assistants and Nurse Practitioners) who all work together to provide you with the care you need, when you need it. You will need a follow up appointment in 12 months with Dr. Jonathan Berry.  Please call our office 2 months in advance to schedule this appointment.    

## 2019-05-19 ENCOUNTER — Other Ambulatory Visit: Payer: Self-pay

## 2019-05-19 ENCOUNTER — Encounter (HOSPITAL_COMMUNITY): Payer: Self-pay | Admitting: *Deleted

## 2019-05-19 ENCOUNTER — Ambulatory Visit (HOSPITAL_COMMUNITY)
Admission: RE | Admit: 2019-05-19 | Discharge: 2019-05-19 | Disposition: A | Discharge status: Home / Self Care | Payer: Medicare HMO | Source: Ambulatory Visit | Attending: Thoracic Surgery (Cardiothoracic Vascular Surgery) | Admitting: Thoracic Surgery (Cardiothoracic Vascular Surgery)

## 2019-05-19 DIAGNOSIS — Z20828 Contact with and (suspected) exposure to other viral communicable diseases: Secondary | ICD-10-CM | POA: Insufficient documentation

## 2019-05-19 DIAGNOSIS — Z01812 Encounter for preprocedural laboratory examination: Secondary | ICD-10-CM | POA: Diagnosis not present

## 2019-05-19 NOTE — Progress Notes (Signed)
Jerry [name] denies chest pain or shortness of breath. Patient was tested for Covidtodayand has been in quarantine since that time. [name] is having Ct with anesthesia because he is unable to lie flat. [name] had cardiac stents placed in Dec 2019.  [name] reports that he had had chest pain 3 times this year, last time over 3 months ago.  [name] has a history of asthma and uses oxygen at times, when needed.  Dr Melvyn Novas is patient's pulmonologist, "he is going to find out why I get short of breath."  I asked anesthesia PA- C to review.

## 2019-05-20 LAB — NOVEL CORONAVIRUS, NAA (HOSP ORDER, SEND-OUT TO REF LAB; TAT 18-24 HRS): SARS-CoV-2, NAA: NOT DETECTED

## 2019-05-20 NOTE — Anesthesia Preprocedure Evaluation (Addendum)
Anesthesia Evaluation  Patient identified by MRN, date of birth, ID band Patient awake    Reviewed: Allergy & Precautions, NPO status , Patient's Chart, lab work & pertinent test results  Airway Mallampati: I  TM Distance: >3 FB Neck ROM: Full    Dental no notable dental hx. (+) Edentulous Upper, Edentulous Lower   Pulmonary shortness of breath, lying and Long-Term Oxygen Therapy, former smoker,    Pulmonary exam normal breath sounds clear to auscultation       Cardiovascular hypertension, + CAD, + Past MI, + Cardiac Stents, + Peripheral Vascular Disease, +CHF and + DOE  Normal cardiovascular exam Rhythm:Regular Rate:Normal     Neuro/Psych  Headaches, PSYCHIATRIC DISORDERS Anxiety Depression  Neuromuscular disease    GI/Hepatic Neg liver ROS, hiatal hernia, GERD  ,  Endo/Other  negative endocrine ROS  Renal/GU negative Renal ROS  negative genitourinary   Musculoskeletal  (+) Arthritis , Osteoarthritis,    Abdominal   Peds negative pediatric ROS (+)  Hematology negative hematology ROS (+)   Anesthesia Other Findings   Reproductive/Obstetrics negative OB ROS                           Anesthesia Physical Anesthesia Plan  ASA: IV  Anesthesia Plan: General   Post-op Pain Management:    Induction: Intravenous  PONV Risk Score and Plan: 2 and Ondansetron  Airway Management Planned: LMA  Additional Equipment:   Intra-op Plan:   Post-operative Plan: Extubation in OR  Informed Consent:   Plan Discussed with: CRNA, Anesthesiologist and Surgeon  Anesthesia Plan Comments: (Follows with Dr. 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 Pt was last seen by Dr. Gwenlyn Found 04/21/19, stable frmo CV standpoint at that time, no changes made to management.  Pt also follows with Dr. Melvyn Novas for [place]. Per notes, "not been able to find any  problem other than obesity/ deconditioning" to explain DOE. Uses 2L O2 QHS.  Pt requires CT under anesthesia due to inability to lay flat.   Cath/PCI 07/17/18: Conclusions: 1. Significant two-vessel coronary artery disease involving the LAD and LCx/OM1. There are sequential 20-70% proximal and mid LAD lesions that are hemodynamically significant (DFR 0.88). Remote stent extending from the proximal LCx into OM1 has diffuse severe ISR in OM1. There is 50% stenosis in mid LCx where it is jailed by the stent extending into OM. This lesion is not significant by DFR (DFR 0.95). 2. Mild, non-obstructive RCA disease. 3. Normal left and right heart filling pressures as well as Fick cardiac output. 4. Successful PCI to mid LAD using Synergy 3.0 x 12 mm DES postdilated to 3.3 mm with 0% residual stenosis and TIMI-3 flow.  Recommendations: 1. Aggressive secondary prevention and medical therapy for OM1 in-stent restenosis. If the patient has continued symptoms despite optimal antianginal therapy, PCI to OM1 could be considered. 2. Dual antiplatelet therapy with aspirin 81 mg daily and ticagrelor 90 mg twice daily for at least 12 months.  TTE 07/17/18: - Left ventricle: The cavity size was normal. Wall thickness was   increased in a pattern of moderate LVH. Systolic function was   mildly to moderately reduced. The estimated ejection fraction was   in the range of 40% to 45%. Anterior and anteroseptal   hypokinesis. Doppler parameters are consistent with abnormal left   ventricular relaxation (grade 1 diastolic dysfunction). The E/e&'   ratio  is between 8-15, suggesting indeterminate LV filling   pressure. - Left atrium: The atrium was normal in size. - Inferior vena cava: The vessel was normal in size. The   respirophasic diameter changes were in the normal range (>= 50%),   consistent with normal central venous pressure.  Impressions:  - Compared to a prior study in 2019, the LVEF is lower  at 40-45%   wtih anterior/anteroseptal hypokinesis and grade 1 DD.    )      Anesthesia Quick Evaluation

## 2019-05-20 NOTE — Progress Notes (Signed)
Anesthesia Chart Review: Same day workup  Follows with Dr. 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 Pt was last seen by Dr. Gwenlyn Found 04/21/19, stable from CV standpoint at that time, no changes made to management.  Pt also follows with Dr. Melvyn Novas for [place]. Per notes, "not been able to find any problem other than obesity/ deconditioning" to explain DOE. Uses 2L O2 QHS.  Pt requires CT under anesthesia due to inability to lay flat.   Cath/PCI 07/17/18: Conclusions: 1. Significant two-vessel coronary artery disease involving the LAD and LCx/OM1. There are sequential 20-70% proximal and mid LAD lesions that are hemodynamically significant (DFR 0.88). Remote stent extending from the proximal LCx into OM1 has diffuse severe ISR in OM1. There is 50% stenosis in mid LCx where it is jailed by the stent extending into OM. This lesion is not significant by DFR (DFR 0.95). 2. Mild, non-obstructive RCA disease. 3. Normal left and right heart filling pressures as well as Fick cardiac output. 4. Successful PCI to mid LAD using Synergy 3.0 x 12 mm DES postdilated to 3.3 mm with 0% residual stenosis and TIMI-3 flow.  Recommendations: 1. Aggressive secondary prevention and medical therapy for OM1 in-stent restenosis. If the patient has continued symptoms despite optimal antianginal therapy, PCI to OM1 could be considered. 2. Dual antiplatelet therapy with aspirin 81 mg daily and ticagrelor 90 mg twice daily for at least 12 months.  TTE 07/17/18: - Left ventricle: The cavity size was normal. Wall thickness was   increased in a pattern of moderate LVH. Systolic function was   mildly to moderately reduced. The estimated ejection fraction was   in the range of 40% to 45%. Anterior and anteroseptal   hypokinesis. Doppler parameters are consistent with abnormal left   ventricular relaxation (grade 1 diastolic dysfunction). The E/e&'   ratio is  between 8-15, suggesting indeterminate LV filling   pressure. - Left atrium: The atrium was normal in size. - Inferior vena cava: The vessel was normal in size. The   respirophasic diameter changes were in the normal range (>= 50%),   consistent with normal central venous pressure.  Impressions:  - Compared to a prior study in 2019, the LVEF is lower at 40-45%   wtih anterior/anteroseptal hypokinesis and grade 1 DD.    Jerry [name] [facility]/Anesthesiology Phone [phone] 05/20/2019 10:29 AM

## 2019-05-22 ENCOUNTER — Other Ambulatory Visit (HOSPITAL_COMMUNITY): Payer: Medicare HMO

## 2019-05-22 ENCOUNTER — Ambulatory Visit (HOSPITAL_COMMUNITY): Payer: Medicare HMO | Admitting: Physician Assistant

## 2019-05-22 ENCOUNTER — Other Ambulatory Visit: Payer: Self-pay

## 2019-05-22 ENCOUNTER — Other Ambulatory Visit: Payer: Self-pay | Admitting: Internal Medicine

## 2019-05-22 ENCOUNTER — Ambulatory Visit (HOSPITAL_COMMUNITY)
Admission: RE | Admit: 2019-05-22 | Discharge: 2019-05-22 | Disposition: A | Discharge status: Home / Self Care | Payer: Medicare HMO | Source: Ambulatory Visit | Attending: Thoracic Surgery (Cardiothoracic Vascular Surgery) | Admitting: Thoracic Surgery (Cardiothoracic Vascular Surgery)

## 2019-05-22 ENCOUNTER — Encounter (HOSPITAL_COMMUNITY)
Admission: RE | Disposition: A | Discharge status: Home / Self Care | Payer: Self-pay | Source: Home / Self Care | Attending: Thoracic Surgery (Cardiothoracic Vascular Surgery)

## 2019-05-22 ENCOUNTER — Ambulatory Visit (HOSPITAL_COMMUNITY)
Admission: RE | Admit: 2019-05-22 | Discharge: 2019-05-22 | Disposition: A | Discharge status: Home / Self Care | Payer: Medicare HMO | Attending: Thoracic Surgery (Cardiothoracic Vascular Surgery) | Admitting: Thoracic Surgery (Cardiothoracic Vascular Surgery)

## 2019-05-22 ENCOUNTER — Encounter (HOSPITAL_COMMUNITY): Payer: Self-pay | Admitting: Certified Registered Nurse Anesthetist

## 2019-05-22 DIAGNOSIS — Z85830 Personal history of malignant neoplasm of bone: Secondary | ICD-10-CM | POA: Diagnosis not present

## 2019-05-22 DIAGNOSIS — Z96653 Presence of artificial knee joint, bilateral: Secondary | ICD-10-CM | POA: Diagnosis not present

## 2019-05-22 DIAGNOSIS — Z87891 Personal history of nicotine dependence: Secondary | ICD-10-CM | POA: Insufficient documentation

## 2019-05-22 DIAGNOSIS — I421 Obstructive hypertrophic cardiomyopathy: Secondary | ICD-10-CM | POA: Insufficient documentation

## 2019-05-22 DIAGNOSIS — I739 Peripheral vascular disease, unspecified: Secondary | ICD-10-CM | POA: Diagnosis not present

## 2019-05-22 DIAGNOSIS — I110 Hypertensive heart disease with heart failure: Secondary | ICD-10-CM | POA: Diagnosis not present

## 2019-05-22 DIAGNOSIS — I252 Old myocardial infarction: Secondary | ICD-10-CM | POA: Diagnosis not present

## 2019-05-22 DIAGNOSIS — G894 Chronic pain syndrome: Secondary | ICD-10-CM | POA: Diagnosis not present

## 2019-05-22 DIAGNOSIS — I2510 Atherosclerotic heart disease of native coronary artery without angina pectoris: Secondary | ICD-10-CM | POA: Diagnosis not present

## 2019-05-22 DIAGNOSIS — K219 Gastro-esophageal reflux disease without esophagitis: Secondary | ICD-10-CM | POA: Insufficient documentation

## 2019-05-22 DIAGNOSIS — C493 Malignant neoplasm of connective and soft tissue of thorax: Secondary | ICD-10-CM

## 2019-05-22 DIAGNOSIS — Z6834 Body mass index (BMI) 34.0-34.9, adult: Secondary | ICD-10-CM | POA: Insufficient documentation

## 2019-05-22 DIAGNOSIS — Z08 Encounter for follow-up examination after completed treatment for malignant neoplasm: Secondary | ICD-10-CM | POA: Insufficient documentation

## 2019-05-22 DIAGNOSIS — F17210 Nicotine dependence, cigarettes, uncomplicated: Secondary | ICD-10-CM | POA: Insufficient documentation

## 2019-05-22 DIAGNOSIS — Z9981 Dependence on supplemental oxygen: Secondary | ICD-10-CM | POA: Insufficient documentation

## 2019-05-22 DIAGNOSIS — Z880 Allergy status to penicillin: Secondary | ICD-10-CM | POA: Insufficient documentation

## 2019-05-22 DIAGNOSIS — Z79899 Other long term (current) drug therapy: Secondary | ICD-10-CM | POA: Diagnosis not present

## 2019-05-22 DIAGNOSIS — M1711 Unilateral primary osteoarthritis, right knee: Secondary | ICD-10-CM | POA: Insufficient documentation

## 2019-05-22 DIAGNOSIS — Z881 Allergy status to other antibiotic agents status: Secondary | ICD-10-CM | POA: Diagnosis not present

## 2019-05-22 DIAGNOSIS — Z885 Allergy status to narcotic agent status: Secondary | ICD-10-CM | POA: Diagnosis not present

## 2019-05-22 DIAGNOSIS — F329 Major depressive disorder, single episode, unspecified: Secondary | ICD-10-CM | POA: Insufficient documentation

## 2019-05-22 DIAGNOSIS — F419 Anxiety disorder, unspecified: Secondary | ICD-10-CM | POA: Diagnosis not present

## 2019-05-22 DIAGNOSIS — I5032 Chronic diastolic (congestive) heart failure: Secondary | ICD-10-CM | POA: Insufficient documentation

## 2019-05-22 DIAGNOSIS — Z955 Presence of coronary angioplasty implant and graft: Secondary | ICD-10-CM | POA: Diagnosis not present

## 2019-05-22 DIAGNOSIS — E785 Hyperlipidemia, unspecified: Secondary | ICD-10-CM | POA: Insufficient documentation

## 2019-05-22 DIAGNOSIS — R0609 Other forms of dyspnea: Secondary | ICD-10-CM

## 2019-05-22 DIAGNOSIS — Z7982 Long term (current) use of aspirin: Secondary | ICD-10-CM | POA: Insufficient documentation

## 2019-05-22 HISTORY — DX: Chronic obstructive pulmonary disease, unspecified: J44.9

## 2019-05-22 HISTORY — PX: RADIOLOGY WITH ANESTHESIA: SHX6223

## 2019-05-22 HISTORY — DX: Peripheral vascular disease, unspecified: I73.9

## 2019-05-22 SURGERY — CT WITH ANESTHESIA
Anesthesia: General

## 2019-05-22 MED ORDER — FENTANYL CITRATE (PF) 100 MCG/2ML IJ SOLN
25.0000 ug | INTRAMUSCULAR | Status: DC | PRN
  Administered 2019-05-22 (×2): 50 ug via INTRAVENOUS

## 2019-05-22 MED ORDER — SUCCINYLCHOLINE CHLORIDE 20 MG/ML IJ SOLN
INTRAMUSCULAR | Status: DC | PRN
  Administered 2019-05-22: 100 mg via INTRAVENOUS

## 2019-05-22 MED ORDER — PROPOFOL 10 MG/ML IV BOLUS
INTRAVENOUS | Status: DC | PRN
  Administered 2019-05-22: 50 mg via INTRAVENOUS
  Administered 2019-05-22: 100 mg via INTRAVENOUS

## 2019-05-22 MED ORDER — METOPROLOL TARTRATE 12.5 MG HALF TABLET
ORAL_TABLET | ORAL | Status: AC
  Filled 2019-05-22: qty 1

## 2019-05-22 MED ORDER — METOPROLOL TARTRATE 12.5 MG HALF TABLET
12.5000 mg | ORAL_TABLET | Freq: Once | ORAL | Status: AC
  Administered 2019-05-22: 12.5 mg via ORAL

## 2019-05-22 MED ORDER — LACTATED RINGERS IV SOLN
INTRAVENOUS | Status: DC
  Administered 2019-05-22: 10:00:00 via INTRAVENOUS

## 2019-05-22 MED ORDER — PHENYLEPHRINE HCL (PRESSORS) 10 MG/ML IV SOLN
INTRAVENOUS | Status: DC | PRN
  Administered 2019-05-22: 120 ug via INTRAVENOUS

## 2019-05-22 MED ORDER — FENTANYL CITRATE (PF) 100 MCG/2ML IJ SOLN
INTRAMUSCULAR | Status: AC
  Administered 2019-05-22: 50 ug via INTRAVENOUS
  Filled 2019-05-22: qty 2

## 2019-05-22 NOTE — Progress Notes (Addendum)
Patient stated that on Tuesday 05/19/2019, he accidentally hit his right great toe, which resulted in the right great toenail ripping off. When assessed, the toenail bed was pink with old bloody drainage, with small remains of the nail to the inner right corner. Patient stated that his son cleaned it and applied Neosporin, dressing it with a Band-Aid and tape. I cleansed toenail with normal saline, applied sterile 2x2 gauze, secured with tape. Patient tolerated well. Patient instructed to follow up with his PCP.

## 2019-05-22 NOTE — Transfer of Care (Signed)
Immediate Anesthesia Transfer of Care Note  Patient: Jerry [name]  Procedure(s) Performed: CT WITH ANESTHESIA  CHEST WITHOUT CONTRAST (N/A )  Patient Location: PACU  Anesthesia Type:General  Level of Consciousness: awake, alert  and oriented  Airway & Oxygen Therapy: Patient Spontanous Breathing and Patient connected to nasal cannula oxygen  Post-op Assessment: Report given to RN, Post -op Vital signs reviewed and stable and Patient moving all extremities  Post vital signs: Reviewed and stable  Last Vitals:  Vitals Value Taken Time  BP 128/92 05/22/19 1200  Temp    Pulse 103 05/22/19 1202  Resp 19 05/22/19 1202  SpO2 94 % 05/22/19 1202  Vitals shown include unvalidated device data.  Last Pain:  Vitals:   05/22/19 1200  TempSrc:   PainSc: 8       Patients Stated Pain Goal: 5 ([phone] [phone])  Complications: No apparent anesthesia complications

## 2019-05-22 NOTE — Anesthesia Procedure Notes (Signed)
Procedure Name: Intubation Date/Time: 05/22/2019 11:06 AM Performed by: Narda Fundora T, CRNA Pre-anesthesia Checklist: Patient identified, Emergency Drugs available, Suction available and Patient being monitored Patient Re-evaluated:Patient Re-evaluated prior to induction Oxygen Delivery Method: Circle system utilized Preoxygenation: Pre-oxygenation with 100% oxygen Induction Type: IV induction Ventilation: Mask ventilation without difficulty Laryngoscope Size: Miller and 3 Grade View: Grade I Tube type: Oral Tube size: 8.0 mm Number of attempts: 1 Airway Equipment and Method: Patient positioned with wedge pillow and Stylet Placement Confirmation: ETT inserted through vocal cords under direct vision,  positive ETCO2 and breath sounds checked- equal and bilateral Secured at: 22 cm Tube secured with: Tape Dental Injury: Teeth and Oropharynx as per pre-operative assessment
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
## 2019-05-24 NOTE — Anesthesia Postprocedure Evaluation (Signed)
Anesthesia Post Note  Patient: PAARTH MACHO  Procedure(s) Performed: CT WITH ANESTHESIA  CHEST WITHOUT CONTRAST (N/A )     Patient location during evaluation: PACU Anesthesia Type: General Level of consciousness: awake and alert Pain management: pain level controlled Vital Signs Assessment: post-procedure vital signs reviewed and stable Respiratory status: spontaneous breathing, nonlabored ventilation, respiratory function stable and patient connected to nasal cannula oxygen Cardiovascular status: blood pressure returned to baseline and stable Postop Assessment: no apparent nausea or vomiting Anesthetic complications: no    Last Vitals:  Vitals:   05/22/19 1200 05/22/19 1245  BP: (!) 128/92 122/64  Pulse: (!) 104 76  Resp: 17 (!) 21  Temp:  36.7 C  SpO2: 93% 93%    Last Pain:  Vitals:   05/22/19 1215  TempSrc:   PainSc: 5                  Cornelius Schuitema

## 2019-05-25 ENCOUNTER — Encounter (HOSPITAL_COMMUNITY): Payer: Self-pay | Admitting: Radiology

## 2019-05-25 ENCOUNTER — Ambulatory Visit: Payer: Medicare HMO | Admitting: Internal Medicine

## 2019-05-26 ENCOUNTER — Telehealth (INDEPENDENT_AMBULATORY_CARE_PROVIDER_SITE_OTHER): Payer: Medicare HMO | Admitting: Thoracic Surgery (Cardiothoracic Vascular Surgery)

## 2019-05-26 ENCOUNTER — Other Ambulatory Visit: Payer: Self-pay

## 2019-05-26 DIAGNOSIS — C413 Malignant neoplasm of ribs, sternum and clavicle: Secondary | ICD-10-CM | POA: Diagnosis not present
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
## 2019-06-04 ENCOUNTER — Telehealth: Payer: Self-pay

## 2019-06-04 NOTE — Telephone Encounter (Signed)
Dr Gwenlyn Found, please review antiplatelet therapy and see if you are ok to hold aspirin and Brilinta. Last PCI was on 07/17/2018 with DES to mLAD. Please specify when is the earliest time for the patient to start holding the DAPT.   Note: aspirin 81 days in the original request is likely a typo.   Please send your reply to [facility] PREOP

## 2019-06-04 NOTE — Telephone Encounter (Signed)
   [facility] Pre-operative Risk Assessment    Request for surgical clearance:  1. What type of surgery is being performed? EPIDURAL STEROID INJECTION   2. When is this surgery scheduled? TBD   3. What type of clearance is required (medical clearance vs. Pharmacy clearance to hold med vs. Both)? PHARMACY  4. Are there any medications that need to be held prior to surgery and how long? BRILINTA 5 DAYS, ASA 81 DAYS   5. Practice name and name of physician performing surgery? [facility]   6. What is your office phone number? [phone]     7.   What is your office fax number? [phone]  8.   Anesthesia type (None, local, MAC, general) ?    Jacqulynn Cadet 06/04/2019, 2:13 PM  _________________________________________________________________   (provider comments below)

## 2019-06-11 NOTE — Telephone Encounter (Signed)
Okay to hold the Brilinta beginning after 07/25/2019 for 5 to 7 days prior to epidural injection.

## 2019-06-15 NOTE — Telephone Encounter (Signed)
   Primary Cardiologist: Quay Burow, MD  Chart reviewed as part of pre-operative protocol coverage. Given past medical history and time since last visit, based on ACC/AHA guidelines, Jerry [name] would be at acceptable risk for the planned procedure without further cardiovascular testing.   Per Dr. Gwenlyn Found, it will be acceptable to hold the Brilinta beginning AFTER 07/25/2019 for 5 to 7 days prior to epidural injection.  I will route this recommendation to the requesting party via Epic fax function and remove from pre-op pool.  Please call with questions.  Kathyrn Drown, NP 06/15/2019, 9:02 AM

## 2019-07-20 ENCOUNTER — Other Ambulatory Visit: Payer: Self-pay | Admitting: Cardiovascular Disease

## 2019-07-25 ENCOUNTER — Other Ambulatory Visit (HOSPITAL_COMMUNITY)
Admission: RE | Admit: 2019-07-25 | Discharge: 2019-07-25 | Disposition: A | Discharge status: Home / Self Care | Payer: Medicare HMO | Source: Ambulatory Visit | Attending: Internal Medicine | Admitting: Internal Medicine

## 2019-07-25 DIAGNOSIS — Z20828 Contact with and (suspected) exposure to other viral communicable diseases: Secondary | ICD-10-CM | POA: Diagnosis not present

## 2019-07-25 DIAGNOSIS — Z01812 Encounter for preprocedural laboratory examination: Secondary | ICD-10-CM | POA: Diagnosis present

## 2019-07-26 LAB — NOVEL CORONAVIRUS, NAA (HOSP ORDER, SEND-OUT TO REF LAB; TAT 18-24 HRS): SARS-CoV-2, NAA: NOT DETECTED

## 2019-07-29 ENCOUNTER — Ambulatory Visit (INDEPENDENT_AMBULATORY_CARE_PROVIDER_SITE_OTHER): Payer: Medicare HMO | Admitting: Internal Medicine

## 2019-07-29 ENCOUNTER — Other Ambulatory Visit: Payer: Self-pay

## 2019-07-29 ENCOUNTER — Encounter: Payer: Self-pay | Admitting: Internal Medicine

## 2019-07-29 DIAGNOSIS — J9611 Chronic respiratory failure with hypoxia: Secondary | ICD-10-CM | POA: Diagnosis not present

## 2019-07-29 DIAGNOSIS — R0600 Dyspnea, unspecified: Secondary | ICD-10-CM

## 2019-07-29 DIAGNOSIS — J449 Chronic obstructive pulmonary disease, unspecified: Secondary | ICD-10-CM

## 2019-07-29 DIAGNOSIS — R0609 Other forms of dyspnea: Secondary | ICD-10-CM

## 2019-07-29 LAB — PULMONARY FUNCTION TEST
DL/VA % pred: 114 %
DL/VA: 4.68 ml/min/mmHg/L
DLCO unc % pred: 67 %
DLCO unc: 17.61 ml/min/mmHg
FEF 25-75 Post: 1.03 L/sec
FEF 25-75 Pre: 0.92 L/sec
FEF2575-%Change-Post: 12 %
FEF2575-%Pred-Post: 40 %
FEF2575-%Pred-Pre: 35 %
FEV1-%Change-Post: 3 %
FEV1-%Pred-Post: 54 %
FEV1-%Pred-Pre: 52 %
FEV1-Post: 1.8 L
FEV1-Pre: 1.75 L
FEV1FVC-%Change-Post: 1 %
FEV1FVC-%Pred-Pre: 89 %
FEV6-%Change-Post: 3 %
FEV6-%Pred-Post: 62 %
FEV6-%Pred-Pre: 60 %
FEV6-Post: 2.66 L
FEV6-Pre: 2.57 L
FEV6FVC-%Change-Post: 1 %
FEV6FVC-%Pred-Post: 104 %
FEV6FVC-%Pred-Pre: 103 %
FVC-%Change-Post: 1 %
FVC-%Pred-Post: 59 %
FVC-%Pred-Pre: 58 %
FVC-Post: 2.67 L
FVC-Pre: 2.63 L
Post FEV1/FVC ratio: 67 %
Post FEV6/FVC ratio: 99 %
Pre FEV1/FVC ratio: 66 %
Pre FEV6/FVC Ratio: 98 %
RV % pred: 99 %
RV: 2.41 L
TLC % pred: 71 %
TLC: 5.01 L

## 2019-07-29 MED ORDER — BREZTRI AEROSPHERE 160-9-4.8 MCG/ACT IN AERO: 2.0000 | INHALATION_SPRAY | Freq: Two times a day (BID) | RESPIRATORY_TRACT | 0 refills | Status: DC

## 2019-07-29 MED ORDER — BREZTRI AEROSPHERE 160-9-4.8 MCG/ACT IN AERO: 2.0000 | INHALATION_SPRAY | Freq: Two times a day (BID) | RESPIRATORY_TRACT | 1 refills | Status: DC

## 2019-07-29 NOTE — Progress Notes (Signed)
PFT done today. 
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
## 2019-07-29 NOTE — Patient Instructions (Addendum)
Remember Try albuterol 15 min before an activity that you know would make you short of breath and see if it makes any difference and if makes none then don't take it after activity unless you can't catch your breath.      Wear 02 2lpm at bedtime - it's good for your heart    Try Breztri Take 2 puffs first thing in am and then another 2 puffs about 12 hours later. (BEVESPI has 2/3 of the same medications and may be cheaper for now    Please schedule a follow up visit in 6 months but call sooner if needed

## 2019-07-30 ENCOUNTER — Encounter: Payer: Self-pay | Admitting: Internal Medicine

## 2019-07-30 DIAGNOSIS — J9612 Chronic respiratory failure with hypercapnia: Secondary | ICD-10-CM | POA: Insufficient documentation

## 2019-07-30 DIAGNOSIS — J9611 Chronic respiratory failure with hypoxia: Secondary | ICD-10-CM | POA: Insufficient documentation

## 2019-07-30 NOTE — Assessment & Plan Note (Signed)
2lpm hs (non adherent)  Advised since waking up sob and his h/o heart problems would rec wear 02 qhs whether feels like he needs it or not and check his sats at highest level of exertion to be sure over 88% or will need to consider amb 02 as well

## 2019-07-30 NOTE — Assessment & Plan Note (Addendum)
Restrictive pfts 07/29/2019 with erv 55% c/w obesity effects on lung volumes  Body mass index is 40.96 kg/m.  -  trending up Lab Results  Component Value Date   TSH 2.75 02/03/2019     Contributing to gerd risk/ doe/reviewed the need and the process to achieve and maintain neg calorie balance > defer f/u primary care including intermittently monitoring thyroid status

## 2019-07-30 NOTE — Assessment & Plan Note (Addendum)
Quit smoking 2016 - PFT's  07/29/2019  FEV1 1.80 (54 % ) ratio 0.67  p 3 % improvement from saba p 0 prior to study with DLCO  17.61 (67%) corrects to 4.68 (114%)  for alv volume and FV curve classic curvature   -07/29/2019  After extensive coaching inhaler device,  effectiveness =    50% > try bevespi (breztri given as sample)   Reviewed with pt: I spent extra time with pt today reviewing appropriate use of albuterol for prn use on exertion with the following points: 1) saba is for relief of sob that does not improve by walking a slower pace or resting but rather if the pt does not improve after trying this first. 2) If the pt is convinced, as many are, that saba helps recover from activity faster then it's easy to tell if this is the case by re-challenging : ie stop, take the inhaler, then p 5 minutes try the exact same activity (intensity of workload) that just caused the symptoms and see if they are substantially diminished or not after saba 3) if there is an activity that reproducibly causes the symptoms, try the saba 15 min before the activity on alternate days   If in fact the saba really does help, then fine to continue to use it prn but advised may need to look closer at the maintenance regimen being used to achieve better control of airways disease with exertion.   RE maint rx: As I explained to this patient in detail:  although there iscopd present, it may not be clinically relevant:   it may not   be limiting activity tolerance any more than a set of worn tires limits someone from driving a car  around a parking lot.  A new set of Michelins might look good but would have no perceived impact on the performance of the car and would not be worth the cost.  That is to say:   this pt is so sedentary he may not see benefit from maint rx but if he does improve and less need for saba then reasonable to continue laba/lama and if having exacerbation then add ICS (triple rx = Breztri)  Advised:   formulary restrictions will be an ongoing challenge for the forseable future and I would be happy to pick an alternative if the pt will first  provide me a list of them -  pt  will need to return here for training for any new device that is required eg dpi vs hfa vs respimat.    In the meantime we can always provide samples so that the patient never runs out of any needed respiratory medications.    I had an extended discussion with the patient reviewing all relevant studies completed to date and  lasting 15 to 20 minutes of a 25 minute visit    I performed detailed device teaching using a teach back method    Each maintenance medication was reviewed in detail including emphasizing most importantly the difference between maintenance and prns and under what circumstances the prns are to be triggered using an action plan format that is not reflected in the computer generated alphabetically organized AVS which I have not found useful in most complex patients, especially with respiratory illnesses  Please see AVS for specific instructions unique to this visit that I personally wrote and verbalized to the the pt in detail and then reviewed with pt  by my nurse highlighting any  changes in therapy  recommended at today's visit to their plan of care.

## 2019-07-30 NOTE — Assessment & Plan Note (Addendum)
Onset nov 2019  - PFT's preop  For cw resection 2016  @ wt 292 =   FEV1 2.64 (75 % ) ratio 0.77  p 2 % improvement from saba p 0 prior to study with DLCO  88 % corrects to 115 % for alv volume  With ERV 46%  - LHC/RHC 07/2018 Normal left and right heart filling pressures as well as Fick cardiac output. - 02/03/2019   Walked RA x one lap =  approx 250 ft - stopped due to  Sob multiple times with sats still > 95% at all times  - 02/03/2019  Alpha one AT screen >  Level 193 MM - 03/09/2019   Walked RA x one lap =  approx 250 ft - stopped due to sob with sats around 92%  - PFT's 07/29/2019 c/w restriction with ERV 55% plus  GOLD II copd see copd   Clearly multifactorial and mostly obesity (based on ERV reduction) / deconditioning at this point but may benefit from copd rx and of course wt loss.

## 2019-08-03 ENCOUNTER — Telehealth: Payer: Self-pay | Admitting: Internal Medicine

## 2019-08-03 DIAGNOSIS — J9611 Chronic respiratory failure with hypoxia: Secondary | ICD-10-CM

## 2019-08-03 DIAGNOSIS — J449 Chronic obstructive pulmonary disease, unspecified: Secondary | ICD-10-CM

## 2019-08-03 NOTE — Telephone Encounter (Signed)
Called and spoke with pt who stated he was needing a new Rx for O2 to be sent to [facility] which used to be [place]. Pt said that he wears his O2 at 2L at night only.  Stated to pt that we would get order placed and he verbalized understanding.nothing further needed.

## 2019-08-04 ENCOUNTER — Telehealth: Payer: Self-pay | Admitting: Internal Medicine

## 2019-08-04 DIAGNOSIS — J9611 Chronic respiratory failure with hypoxia: Secondary | ICD-10-CM

## 2019-08-04 NOTE — Telephone Encounter (Signed)
Yesterday, 12/28 when speaking with pt, pt said he had been wearing the O2 at night as this is how he said MW had wanted him to wear it. I thought that pt had had an ONO performed based on this info, my mistake.  Assessment & Plan Note by Tanda Rockers, MD at 07/30/2019 6:24 AM Author: Tanda Rockers, MD Author Type: Physician Filed: 07/30/2019 6:25 AM  Note Status: Written Cosign: Cosign Not Required Encounter Date: 07/29/2019  Problem: Chronic respiratory failure with hypoxia [facility])  Editor: Tanda Rockers, MD (Physician)    2lpm hs (non adherent)  Advised since waking up sob and his h/o heart problems would rec wear 02 qhs whether feels like he needs it or not and check his sats at highest level of exertion to be sure over 88% or will need to consider amb 02 as well        Dr. Melvyn Novas, please advise if it is okay for [place] to order an ONO to be performed on room air so that way we can see if pt actually qualifies for O2 at night and so insurance can cover it?

## 2019-08-04 NOTE — Telephone Encounter (Signed)
Order placed with notation for it to be on room air. Nothing further needed.

## 2019-08-04 NOTE — Telephone Encounter (Signed)
Yes,  ono RA

## 2019-08-12 ENCOUNTER — Encounter: Payer: Self-pay | Admitting: Cardiovascular Disease

## 2019-08-12 ENCOUNTER — Other Ambulatory Visit: Payer: Self-pay

## 2019-08-12 ENCOUNTER — Ambulatory Visit (INDEPENDENT_AMBULATORY_CARE_PROVIDER_SITE_OTHER): Payer: Medicare HMO | Admitting: Cardiovascular Disease

## 2019-08-12 ENCOUNTER — Encounter: Payer: Self-pay | Admitting: *Deleted

## 2019-08-12 VITALS — BP 127/78 | HR 88 | Temp 96.8°F | Ht 74.0 in | Wt 303.0 lb

## 2019-08-12 DIAGNOSIS — R000 Tachycardia, unspecified: Secondary | ICD-10-CM

## 2019-08-12 DIAGNOSIS — I10 Essential (primary) hypertension: Secondary | ICD-10-CM

## 2019-08-12 DIAGNOSIS — E782 Mixed hyperlipidemia: Secondary | ICD-10-CM | POA: Diagnosis not present

## 2019-08-12 DIAGNOSIS — I878 Other specified disorders of veins: Secondary | ICD-10-CM

## 2019-08-12 DIAGNOSIS — I2510 Atherosclerotic heart disease of native coronary artery without angina pectoris: Secondary | ICD-10-CM | POA: Diagnosis not present

## 2019-08-12 DIAGNOSIS — F17210 Nicotine dependence, cigarettes, uncomplicated: Secondary | ICD-10-CM

## 2019-08-12 NOTE — Assessment & Plan Note (Signed)
History of essential hypertension blood pressure measured today 127/78.  He is on losartan and metoprolol.

## 2019-08-12 NOTE — Assessment & Plan Note (Signed)
History of hyperlipidemia on statin therapy with lipid profile performed 08/29/2017 revealing total cholesterol 142, LDL 70 and HDL 40.

## 2019-08-12 NOTE — Patient Instructions (Signed)
Medication Instructions:  Your physician recommends that you continue on your current medications as directed. Please refer to the Current Medication list given to you today.  If you need a refill on your cardiac medications before your next appointment, please call your pharmacy.   Lab work: NONE  Testing/Procedures: Your physician has requested that you have an echocardiogram. Echocardiography is a painless test that uses sound waves to create images of your heart. It provides your doctor with information about the size and shape of your heart and how well your heart's chambers and valves are working. This procedure takes approximately one hour. There are no restrictions for this procedure. [place] physician has recommended that you wear an event monitor. Event monitors are medical devices that record the heart's electrical activity. Doctors most often [place] these monitors to diagnose arrhythmias. Arrhythmias are problems with the speed or rhythm of the heartbeat. The monitor is a small, portable device. You can wear one while you do your normal daily activities. This is usually used to diagnose what is causing palpitations/syncope (passing out).  Follow-Up: At [facility], you and your health needs are our priority.  As part of our continuing mission to provide you with exceptional heart care, we have created designated Provider Care Teams.  These Care Teams include your primary Cardiologist (physician) and Advanced Practice Providers (APPs -  Physician Assistants and Nurse Practitioners) who all work together to provide you with the care you need, when you need it. You may see Quay Burow, MD or one of the following Advanced Practice Providers on your designated Care Team:    Kerin Ransom, PA-C  [place], [place]  Coletta Memos, [place]  Your physician wants you to follow-up in: 3 months with a Physicians Assistant  Your physician wants you to  follow-up in: 6 months with Dr Gwenlyn Found    Any Other Special Instructions Will Be Listed Below (If Applicable).  [employer] Cardiac Event Monitor Instructions Your physician has requested you wear your cardiac event monitor for 30 days, (1-30). [employer] may call or text to confirm a shipping address. The monitor will be sent to a land address via UPS. [employer] will not ship a monitor to a PO BOX. It typically takes 3-5 days to receive your monitor after it has been enrolled. [employer] will assist with USPS tracking if your package is delayed. The telephone number for [employer] is [phone]. Once you have received your monitor, please review the enclosed instructions. Instruction tutorials can also be viewed under help and settings on the enclosed cell phone. Your monitor has already been registered assigning a specific monitor serial # to you.  Applying the monitor Remove cell phone from case and turn it on. The cell phone works as Dealer and needs to be within [employer] of you at all times. The cell phone will need to be charged on a daily basis. We recommend you plug the cell phone into the enclosed charger at your bedside table every night.  Monitor batteries: You will receive two monitor batteries labelled #1 and #2. These are your recorders. Plug battery #2 onto the second connection on the enclosed charger. Keep one battery on the charger at all times. This will keep the monitor battery deactivated. It will also keep it fully charged for when you need to switch your monitor batteries. A small light will be blinking on the battery emblem when it is charging. The light on the battery  emblem will remain on when the battery is fully charged.  Open package of a Monitor strip. Insert battery #1 into black hood on strip and gently squeeze monitor battery onto connection as indicated in instruction booklet. Set aside while preparing skin.  Choose location for your strip,  vertical or horizontal, as indicated in the instruction booklet. Shave to remove all hair from location. There cannot be any lotions, oils, powders, or colognes on skin where monitor is to be applied. Wipe skin clean with enclosed Saline wipe. Dry skin completely.  Peel paper labeled #1 off the back of the Monitor strip exposing the adhesive. Place the monitor on the chest in the vertical or horizontal position shown in the instruction booklet. One arrow on the monitor strip must be pointing upward. Carefully remove paper labeled #2, attaching remainder of strip to your skin. Try not to create any folds or wrinkles in the strip as you apply it.  Firmly press and release the circle in the center of the monitor battery. You will hear a small beep. This is turning the monitor battery on. The heart emblem on the monitor battery will light up every 5 seconds if the monitor battery in turned on and connected to the patient securely. Do not push and hold the circle down as this turns the monitor battery off. The cell phone will locate the monitor battery. A screen will appear on the cell phone checking the connection of your monitor strip. This may read poor connection initially but change to good connection within the next minute. Once your monitor accepts the connection you will hear a series of 3 beeps followed by a climbing crescendo of beeps. A screen will appear on the cell phone showing the two monitor strip placement options. Touch the picture that demonstrates where you applied the monitor strip.  Your monitor strip and battery are waterproof. You are able to shower, bathe, or swim with the monitor on. They just ask you do not submerge deeper than 3 feet underwater. We recommend removing the monitor if you are swimming in a lake, river, or ocean.  Your monitor battery will need to be switched to a fully charged monitor battery approximately once a week. The cell phone will alert you of an  action which needs to be made.  On the cell phone, tap for details to reveal connection status, monitor battery status, and cell phone battery status. The green dots indicates your monitor is in good status. A red dot indicates there is something that needs your attention.  To record a symptom, click the circle on the monitor battery. In 30-60 seconds a list of symptoms will appear on the cell phone. Select your symptom and tap save. Your monitor will record a sustained or significant arrhythmia regardless of you clicking the button. Some patients do not feel the heart rhythm irregularities. [employer] will notify us of any serious or critical events.  Refer to instruction booklet for instructions on switching batteries, changing strips, the Do not disturb or Pause features, or any additional questions.  Call [employer] at [phone], to confirm your monitor is transmitting and record your baseline. They will answer any questions you may have regarding the monitor instructions at that time.  Returning the monitor to [place] all equipment back into blue box. Peel off strip of paper to expose adhesive and close box securely. There is a prepaid UPS shipping label on this box. Drop in a UPS drop box, or at a UPS facility  like [employer]. You may also contact [employer] to arrange UPS to pick up monitor package at your home.

## 2019-08-12 NOTE — Assessment & Plan Note (Signed)
On compression stockings and diuretics

## 2019-08-12 NOTE — Progress Notes (Signed)
Patient ID: Jerry [name], male   DOB: [birth year], 69 y.o.   MRN: [id number]  Patient enrolled for [employer] to ship a 30 day cardiac event monitor to the patients home.

## 2019-08-12 NOTE — Assessment & Plan Note (Signed)
History of CAD status post remote circumflex stenting back in 1997 with cardiac cath performed by Dr. Saunders Revel 07/16/2018 revealing subtotally occluded circumflex obtuse marginal branch stent, insignificant AV groove circumflex stenosis with significant mid LAD stenosis which was stented with a 12 mm x 3 mm Synergy drug-eluting stent.  His RCA was free of significant disease.  This did not significantly improve his symptoms however.

## 2019-08-12 NOTE — Assessment & Plan Note (Signed)
Remote tobacco abuse having smoked 40 pack years followed by Dr. Melvyn Novas.

## 2019-08-12 NOTE — Progress Notes (Signed)
08/12/2019 Jerry [name]   09-11-1950  [id number]  Primary Physician Aletha Halim., PA-C Primary Cardiologist: Lorretta Harp MD FACP, [place], [place], [place]  HPI:  Jerry [name] is a 69 y.o.  moderately overweight widowed Caucasian male father of 34, grandfather and 2 grandchildren who has been disabled since 1999.I last saw him in the office  04/21/2019. He is referred by Leonia Reader, PA-C were revascularized evaluation and clearance for his upcoming left total knee replacement. He had seen Dr. Mare Ferrari in the past. He has a history discontinued tobacco abuse, treated hypertension and hyperlipidemia. He apparently had a circumflex obtuse marginal branch stent back in 1997.  He was admitted to [facility] on 07/15/2018 with increasing dyspnea on exertion and lower extreme edema. He was diuresed. 2D echo revealed a moderate decline in LV function from 55 to 60% a year ago down to 40 to 45%. He did undergo a right left heart cath by Dr. Saunders Revel revealing a patent AV groove circumflex stent, severe in-stent restenosis within the first obtuse marginal branch stent and moderate mid LAD disease which was stented. He feels clinically improved.  Marland Kitchen  He is in a wheelchair because he says his left leg is unstable.  He sees Dr. Melvyn Novas for his pulmonary issues.  He denies chest pain.  Since I saw him 4 months ago he does complain of some chronotropic incompetence with rapid heart rate.  He continues to follow-up with Dr. Melvyn Novas.  He is for the most part minimally ambulatory walking with a cane at home.   Current Meds  Medication Sig  . acetaminophen (TYLENOL) 650 MG CR tablet Take 650 mg by mouth daily.  Marland Kitchen aspirin EC 81 MG tablet Take 81 mg by mouth daily.  Marland Kitchen atorvastatin (LIPITOR) 80 MG tablet TAKE 1 TABLET BY MOUTH EVERY DAY AT 6PM  . bisacodyl (DULCOLAX) 10 MG suppository Place 10 mg rectally as needed for moderate constipation.   . BRILINTA 90 MG TABS tablet TAKE 1 TABLET (90  MG TOTAL) BY MOUTH 2 (TWO) TIMES DAILY. (Patient taking differently: Take 90 mg by mouth 2 (two) times daily. )  . Budeson-Glycopyrrol-Formoterol (BREZTRI AEROSPHERE) 160-9-4.8 MCG/ACT AERO Inhale 2 puffs into the lungs 2 (two) times daily.  . calcium carbonate (TUMS - DOSED IN MG ELEMENTAL CALCIUM) 500 MG chewable tablet Chew 1 tablet by mouth daily as needed for indigestion or heartburn.   . cholecalciferol (VITAMIN D) 1000 units tablet Take 1,000 Units by mouth daily.  Marland Kitchen dutasteride (AVODART) 0.5 MG capsule Take 0.5 mg by mouth at bedtime.  Marland Kitchen escitalopram (LEXAPRO) 20 MG tablet Take 20 mg by mouth daily.  . ferrous sulfate 325 (65 FE) MG tablet Take 325 mg by mouth daily.  . furosemide (LASIX) 20 MG tablet Take 2 tablets (40 mg total) by mouth every other day. (Patient taking differently: Take 20-40 mg by mouth daily. 20 mg extra as needed if gain 2 lbs)  . HYDROmorphone (DILAUDID) 8 MG tablet Take 8 mg by mouth every 6 (six) hours as needed for severe pain.   Marland Kitchen losartan (COZAAR) 25 MG tablet TAKE 1 TABLET BY MOUTH EVERY DAY (Patient taking differently: Take 25 mg by mouth daily. )  . metoprolol tartrate (LOPRESSOR) 25 MG tablet Take 0.5 tablets (12.5 mg total) by mouth 2 (two) times daily.  . Multiple Vitamin (MULTIVITAMIN WITH MINERALS) TABS tablet Take 1 tablet by mouth daily. FOR SENIORS  . nitroGLYCERIN (NITROSTAT) 0.4 MG SL tablet  Place 0.4 mg under the tongue every 5 (five) minutes x 3 doses as needed for chest pain.  . nortriptyline (PAMELOR) 10 MG capsule Take 40 mg by mouth at bedtime.  . pantoprazole (PROTONIX) 40 MG tablet Take 1 tablet (40 mg total) by mouth 2 (two) times daily.  . polyethylene glycol (MIRALAX / GLYCOLAX) packet Take 17 g by mouth daily.  . potassium chloride (K-DUR,KLOR-CON) 10 MEQ tablet Take 1 tablet (10 mEq total) by mouth every other day. (Patient taking differently: Take 10 mEq by mouth daily. )  . promethazine (PHENERGAN) 25 MG tablet Take 25 mg by mouth 3  (three) times daily as needed for nausea.   Marland Kitchen senna (SENOKOT) 8.6 MG TABS tablet Take 2 tablets by mouth 2 (two) times daily.  . silodosin (RAPAFLO) 8 MG CAPS capsule Take 1 capsule (8 mg total) by mouth daily with breakfast.  . SUMAtriptan (IMITREX) 100 MG tablet Take 100 mg by mouth every 2 (two) hours as needed for migraine. May repeat in 2 hours if headache persists or recurs.  . Tetrahydrozoline HCl (VISINE OP) Place 1 drop into both eyes daily as needed (dry eyes).  Marland Kitchen tiZANidine (ZANAFLEX) 4 MG tablet Take 4 mg by mouth every 6 (six) hours as needed for muscle spasms.  . traZODone (DESYREL) 150 MG tablet Take 300 mg by mouth at bedtime.   . VENTOLIN HFA 108 (90 Base) MCG/ACT inhaler Inhale 2 puffs into the lungs every 6 (six) hours as needed for shortness of breath.  . vitamin C (ASCORBIC ACID) 500 MG tablet Take 500 mg by mouth daily.     Allergies  Allergen Reactions  . Ciprofloxacin Anaphylaxis  . Penicillins Rash and Other (See Comments)    UNSPECIFIED SEVERITY Has patient had a PCN reaction causing immediate rash, facial/tongue/throat swelling, SOB or lightheadedness with hypotension:unsure Has patient had a PCN reaction causing severe rash involving mucus membranes or skin necrosis:unsure Has patient had a PCN reaction that required hospitalization:unsure Has patient had a PCN reaction occurring within the last 10 years:NO   . Tramadol Hcl Rash    Social History   Socioeconomic History  . Marital status: Widowed    Spouse name: Not on file  . Number of children: Not on file  . Years of education: Not on file  . Highest education level: Not on file  Occupational History  . Occupation: Disabled   Tobacco Use  . Smoking status: Former Smoker    Packs/day: 0.25    Years: 33.00    Pack years: 8.25    Types: Cigarettes    Quit date: 05/05/2015    Years since quitting: 4.2  . Smokeless tobacco: Never Used  Substance and Sexual Activity  . Alcohol use: No     Alcohol/week: 0.0 standard drinks  . Drug use: No  . Sexual activity: Never  Other Topics Concern  . Not on file  Social History Narrative  . Not on file   Social Determinants of Health   Financial Resource Strain:   . Difficulty of Paying Living Expenses: Not on file  Food Insecurity:   . Worried About Charity fundraiser in the Last Year: Not on file  . Ran Out of Food in the Last Year: Not on file  Transportation Needs:   . Lack of Transportation (Medical): Not on file  . Lack of Transportation (Non-Medical): Not on file  Physical Activity:   . Days of Exercise per Week: Not on file  . Minutes  of Exercise per Session: Not on file  Stress:   . Feeling of Stress : Not on file  Social Connections:   . Frequency of Communication with Friends and Family: Not on file  . Frequency of Social Gatherings with Friends and Family: Not on file  . Attends Religious Services: Not on file  . Active Member of Clubs or Organizations: Not on file  . Attends Archivist Meetings: Not on file  . Marital Status: Not on file  Intimate Partner Violence:   . Fear of Current or Ex-Partner: Not on file  . Emotionally Abused: Not on file  . Physically Abused: Not on file  . Sexually Abused: Not on file     Review of Systems: General: negative for chills, fever, night sweats or weight changes.  Cardiovascular: negative for chest pain, dyspnea on exertion, edema, orthopnea, palpitations, paroxysmal nocturnal dyspnea or shortness of breath Dermatological: negative for rash Respiratory: negative for cough or wheezing Urologic: negative for hematuria Abdominal: negative for nausea, vomiting, diarrhea, bright red blood per rectum, melena, or hematemesis Neurologic: negative for visual changes, syncope, or dizziness All other systems reviewed and are otherwise negative except as noted above.    Blood pressure 127/78, pulse 88, temperature (!) 96.8 F (36 C), height 6\' 2"  (1.88 m), weight  (!) 303 lb (137.4 kg), SpO2 92 %.  General appearance: alert and no distress Neck: no adenopathy, no carotid bruit, no JVD, supple, symmetrical, trachea midline and thyroid not enlarged, symmetric, no tenderness/mass/nodules Lungs: clear to auscultation bilaterally Heart: regular rate and rhythm, S1, S2 normal, no murmur, click, rub or gallop Extremities: 2+ pitting edema bilaterally wearing compression stockings Pulses: 2+ and symmetric Skin: Skin color, texture, turgor normal. No rashes or lesions Neurologic: Alert and oriented X 3, normal strength and tone. Normal symmetric reflexes. Normal coordination and gait  EKG sinus rhythm at 88 with incomplete right bundle branch block.  Personally reviewed this EKG.  ASSESSMENT AND PLAN:   CAD (coronary artery disease) History of CAD status post remote circumflex stenting back in 1997 with cardiac cath performed by Dr. Saunders Revel 07/16/2018 revealing subtotally occluded circumflex obtuse marginal branch stent, insignificant AV groove circumflex stenosis with significant mid LAD stenosis which was stented with a 12 mm x 3 mm Synergy drug-eluting stent.  His RCA was free of significant disease.  This did not significantly improve his symptoms however.  Cigarette smoker Remote tobacco abuse having smoked 40 pack years followed by Dr. Melvyn Novas.  HLD (hyperlipidemia) History of hyperlipidemia on statin therapy with lipid profile performed 08/29/2017 revealing total cholesterol 142, LDL 70 and HDL 40.  Essential hypertension History of essential hypertension blood pressure measured today 127/78.  He is on losartan and metoprolol.  Chronic venous stasis On compression stockings and diuretics      Lorretta Harp MD [facility], [facility] 08/12/2019 10:57 AM

## 2019-08-19 ENCOUNTER — Encounter: Payer: Self-pay | Admitting: Internal Medicine

## 2019-08-19 ENCOUNTER — Ambulatory Visit: Payer: Medicare HMO

## 2019-08-19 ENCOUNTER — Ambulatory Visit (INDEPENDENT_AMBULATORY_CARE_PROVIDER_SITE_OTHER): Payer: Medicare HMO

## 2019-08-19 DIAGNOSIS — R000 Tachycardia, unspecified: Secondary | ICD-10-CM

## 2019-08-21 ENCOUNTER — Telehealth: Payer: Self-pay | Admitting: Internal Medicine

## 2019-08-21 DIAGNOSIS — J9611 Chronic respiratory failure with hypoxia: Secondary | ICD-10-CM

## 2019-08-21 NOTE — Telephone Encounter (Signed)
Called and spoke with pt. Pt stated he had ONO about a week ago and stated [employer] had not received anything about an order for O2. Stated to pt that I would check to see if we have received the results from the ONO and stated to him after they had been reviewed by MW, we would see if he did qualify for nocturnal O2 and also stated to him if he did, we would get it taken care of. Pt verbalized understanding.    Magda Paganini, have you seen any results from pt's ONO?

## 2019-08-21 NOTE — Telephone Encounter (Signed)
I have not received anything on him  Called [employer] and spoke with Suanne Marker and she is going to fax  Will hold until I receive

## 2019-08-25 NOTE — Telephone Encounter (Signed)
Called [employer] and spoke with Latoya to have ONO refaxed to me in the TL office where I will be working today @ [phone]  Test date was 1.13.21  Latoya attempted to transfer me to the resp dept but no one answered - she will send them a message to have the ONO faxed to the number above.  Will hold in triage to await results.

## 2019-08-25 NOTE — Telephone Encounter (Signed)
I never received this fax

## 2019-08-25 NOTE — Telephone Encounter (Signed)
ONO received and handed to [employer] note routed to Dr Melvyn Novas

## 2019-08-25 NOTE — Telephone Encounter (Signed)
Magda Paganini, have you received this? If not, triage can call to have it re-faxed.

## 2019-08-26 ENCOUNTER — Ambulatory Visit (HOSPITAL_COMMUNITY): Payer: Medicare HMO | Attending: Cardiovascular Disease

## 2019-08-26 ENCOUNTER — Other Ambulatory Visit: Payer: Self-pay

## 2019-08-26 DIAGNOSIS — I2510 Atherosclerotic heart disease of native coronary artery without angina pectoris: Secondary | ICD-10-CM | POA: Diagnosis present

## 2019-08-26 DIAGNOSIS — R000 Tachycardia, unspecified: Secondary | ICD-10-CM | POA: Diagnosis present

## 2019-08-26 MED ORDER — PERFLUTREN LIPID MICROSPHERE
1.0000 mL | INTRAVENOUS | Status: AC | PRN
  Administered 2019-08-26: 2 mL via INTRAVENOUS

## 2019-08-26 NOTE — Telephone Encounter (Signed)
Per Dr Melvyn Novas- he does qualify for 2lpm o2 with sleep  Spoke with pt and notified of results per Dr. Melvyn Novas. Pt verbalized understanding and denied any questions.  Order sent to [facility]

## 2019-09-03 ENCOUNTER — Telehealth: Payer: Self-pay | Admitting: Internal Medicine

## 2019-09-03 NOTE — Telephone Encounter (Signed)
Yes that is fine to add to APP for in office walk test and o2 qualification

## 2019-09-03 NOTE — Telephone Encounter (Signed)
Spoke with the pt  He states had to wear heart monitor and it also checked his sats as well He states that the recordings showed 02 sats dropped 173 times to 89% or less and 64 times at 85% or less with the lowest 80% He states readings were done during the day  He is only using o2 at night  Pt denies any increased SOB, cough, fever, chills, body aches  Is is okay to add him to APP schedule for in person visit? MW does not have any openings coming up  Please advise thanks!

## 2019-09-04 NOTE — Telephone Encounter (Signed)
I called pt but there was no answer and VM to leave message. Will try again later.

## 2019-09-07 NOTE — Telephone Encounter (Signed)
Pt is returning call - I couldn't tell if he needed a 6 minute walk or an appt with an APP or both or what? Please call today at [phone]

## 2019-09-07 NOTE — Telephone Encounter (Signed)
Called the patient back and he stated although he had the ONO done and uses 2L at night, he has noticed for the last 7 - 10 days his O2 is dropping during the day.  When he is sitting down his O2 will drop into the low 80's even low 70's, even when seated. Patient stated his shortness of breath as picked up in the last few days but will cover in about a minute when he is using the O2. but has gone has high as 91%.  Patient stated he has called cardiology since he is wearing the heart monitor, but has not received a call back yet. He has been tested for covid 4 times. The most recent was three weeks ago and all of the tests have been negative.  Recommended to the patient to turn up to 3L when he is sitting or walking if he sees the O2 drop to 88% - 90% (in order to give him time to turn up before it drops to low 80's).  Advised the patient he wants the O2 used keep him at 90%. But if he is exerting himself or sitting and sees his number going below 88% to turn the liter flow up until he is back above 90% and remains before turning it back down.  Patient scheduled for an appointment tomorrow with Derl Barrow, NP as he has to arrange transportation. I suggested to him that if he is able to have an appointment with cardiology same day (depending on what they tell him when called) to try to make that appointment, and if needed we can move his appointment to later in the day if needed. Patient voiced understanding, nothing further needed at this time.

## 2019-09-08 ENCOUNTER — Encounter: Payer: Self-pay | Admitting: Primary Care

## 2019-09-08 ENCOUNTER — Ambulatory Visit: Payer: Medicare HMO | Admitting: Primary Care

## 2019-09-08 ENCOUNTER — Other Ambulatory Visit: Payer: Self-pay

## 2019-09-08 DIAGNOSIS — J9611 Chronic respiratory failure with hypoxia: Secondary | ICD-10-CM

## 2019-09-08 DIAGNOSIS — J449 Chronic obstructive pulmonary disease, unspecified: Secondary | ICD-10-CM | POA: Diagnosis not present

## 2019-09-08 MED ORDER — BREZTRI AEROSPHERE 160-9-4.8 MCG/ACT IN AERO: 2.0000 | INHALATION_SPRAY | Freq: Two times a day (BID) | RESPIRATORY_TRACT | 0 refills | Status: DC
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
## 2019-09-08 NOTE — Assessment & Plan Note (Addendum)
-   09/08/2019- deat <88%RA x1.5 Laps (approx 350 ft) stopped d/t fatigue  - Needs 2L to keep O2 >90%; continue 2L nocturnal oxygen  - Order placed for daytime oxygen with [employer]

## 2019-09-08 NOTE — Patient Instructions (Addendum)
Oxygen: - Titrate oxygen as needed to keep O2 >88-90% - If you are requiring more oxygen then normal signal to call office for check up   COPD: - Continue Breztri two puffs twice daily (sample given, call when needs RX sent) - Use Albuterol rescue inhaler/nebulizer every 6 hours as needed for shortness of breath/wheezing   Referral: - Newly qualified for daytime oxygen - [facility] [employer]   Follow-up: - June 24th with Dr. Melvyn Novas      Home Oxygen Use, Adult When a medical condition keeps you from getting enough oxygen, your health care provider may instruct you to take extra oxygen at home. Your health care provider will let you know:  When to take oxygen.  For how long to take oxygen.  How quickly oxygen should be delivered (flow rate), in liters per minute (LPM or L/M). Home oxygen can be given through:  A mask.  A nasal cannula. This is a device or tube that goes in the nostrils.  A transtracheal catheter. This is a small, flexible tube placed in the trachea.  A tracheostomy. This is a surgically made opening in the trachea. These devices are connected with tubing to an oxygen source, such as:  A tank. Tanks hold oxygen in gas form. They must be replaced when the oxygen is used up.  A liquid oxygen device. This holds oxygen in liquid form. It must be replaced when the oxygen is used up.  An oxygen concentrator machine. This filters oxygen in the room. It uses electricity, so you must have a backup cylinder of oxygen in case the power goes out. Supplies needed: To use oxygen, you will need:  A mask, nasal cannula, transtracheal catheter, or tracheostomy.  An oxygen tank, a liquid oxygen device, or an oxygen concentrator.  The tape that your health care provider recommends (optional). If you use a transtracheal catheter and your prescribed flow rate is 1 LPM or greater, you will also need a humidifier. Risks and complications  Fire. This can happen if the oxygen is exposed  to a heat source, flame, or spark.  Injury to skin. This can happen if liquid oxygen touches your skin.  Organ damage. This can happen if you get too little oxygen. How to use oxygen Your health care provider or a representative from your [place] will show you how to use your oxygen device. Follow her or his instructions. The instructions may look something like this: 1. Wash your hands. 2. If you use an oxygen concentrator, make sure it is plugged in. 3. Place one end of the tube into the port on the tank, device, or machine. 4. Place the mask over your nose and mouth. Or, place the nasal cannula and secure it with tape if instructed. If you use a tracheostomy or transtracheal catheter, connect it to the oxygen source as directed. 5. Make sure the liter-flow setting on the machine is at the level prescribed by your health care provider. 6. Turn on the machine or adjust the knob on the tank or device to the correct liter-flow setting. 7. When you are done, turn off and unplug the machine, or turn the knob to OFF. How to clean and care for the oxygen supplies Nasal cannula  Clean it with a warm, wet cloth daily or as needed.  Wash it with a liquid soap once a week.  Rinse it thoroughly once or twice a week.  Replace it every 2-4 weeks.  If you have an infection,  such as a cold or pneumonia, change the cannula when you get better. Mask  Replace it every 2-4 weeks.  If you have an infection, such as a cold or pneumonia, change the mask when you get better. Humidifier bottle  Wash the bottle between each refill: ? Wash it with soap and warm water. ? Rinse it thoroughly. ? Disinfect it and its top. ? Air-dry it.  Make sure it is dry before you refill it. Oxygen concentrator  Clean the air filter at least twice a week according to directions from your home medical equipment and service company.  Wipe down the cabinet every day. To do this: ? Unplug the  unit. ? Wipe down the cabinet with a damp cloth. ? Dry the cabinet. Other equipment  Change any extra tubing every 1-3 months.  Follow instructions from your health care provider about taking care of any other equipment. Safety tips Fire safety tips   Keep your oxygen and oxygen supplies at least 5 ft away from sources of heat, flames, and sparks at all times.  Do not allow smoking near your oxygen. Put up "no smoking" signs in your home. Avoid smoking areas when in public.  Do not use materials that can burn (are flammable) while you use oxygen.  When you go to a restaurant with portable oxygen, ask to be seated in the nonsmoking section.  Keep a Data processing manager close by. Let your fire department know that you have oxygen in your home.  Test your home smoke detectors regularly. Traveling  Secure your oxygen tank in the vehicle so that it does not move around. Follow instructions from your medical device company about how to safely secure your tank.  Make sure you have enough oxygen for the amount of time you will be away from home.  If you are planning air travel, contact the airline to find out if they allow the use of an approved portable oxygen concentrator. You may also need documents from your health care provider and medical device company before you travel. General safety tips  If you use an oxygen cylinder, make sure it is in a stand or secured to an object that will not move (fixed object).  If you use liquid oxygen, make sure its container is kept upright.  If you use an oxygen concentrator: ? Dance movement psychotherapist company. Make sure you are given priority service in the event that your power goes out. ? Avoid using extension cords, if possible. Follow these instructions at home:  Use oxygen only as told by your health care provider.  Do not use alcohol or other drugs that make you relax (sedating drugs) unless instructed. They can slow down your breathing rate  and make it hard to get in enough oxygen.  Know how and when to order a refill of oxygen.  Always keep a spare tank of oxygen. Plan ahead for holidays when you may not be able to get a prescription filled.  Use water-based lubricants on your lips or nostrils. Do not use oil-based products like petroleum jelly.  To prevent skin irritation on your cheeks or behind your ears, tuck some gauze under the tubing. Contact a health care provider if:  You get headaches often.  You have shortness of breath.  You have a lasting cough.  You have anxiety.  You are sleepy all the time.  You develop an illness that affects your breathing.  You cannot exercise at your regular level.  You are restless.  You have difficult or irregular breathing, and it is getting worse.  You have a fever.  You have persistent redness under your nose. Get help right away if:  You are confused.  You have blue lips or fingernails.  You are struggling to breathe. Summary  Your health care provider or a representative from your [place] will show you how to use your oxygen device. Follow her or his instructions.  If you use an oxygen concentrator, make sure it is plugged in.  Make sure the liter-flow setting on the machine is at the level prescribed by your health care provider.  Keep your oxygen and oxygen supplies at least 5 ft away from sources of heat, flames, and sparks at all times. This information is not intended to replace advice given to you by your health care provider. Make sure you discuss any questions you have with your health care provider. Document Revised: 01/09/2018 Document Reviewed: 02/14/2016 Elsevier Patient Education  [place].

## 2019-09-08 NOTE — Addendum Note (Signed)
Addended by: Parke Poisson E on: 09/08/2019 01:04 PM   Modules accepted: Orders

## 2019-09-08 NOTE — Assessment & Plan Note (Addendum)
-   No acute symptoms. Rare SABA use - Improved with Breztri 2 puffs twice daily (sample given) - FU in June with Dr. Melvyn Novas or sooner if needed

## 2019-10-01 ENCOUNTER — Other Ambulatory Visit: Payer: Self-pay | Admitting: Cardiovascular Disease

## 2019-10-01 ENCOUNTER — Other Ambulatory Visit: Payer: Self-pay | Admitting: *Deleted

## 2019-10-01 DIAGNOSIS — R000 Tachycardia, unspecified: Secondary | ICD-10-CM

## 2019-10-01 DIAGNOSIS — I2510 Atherosclerotic heart disease of native coronary artery without angina pectoris: Secondary | ICD-10-CM

## 2019-10-02 ENCOUNTER — Other Ambulatory Visit: Payer: Self-pay | Admitting: Cardiovascular Disease

## 2019-10-02 NOTE — Telephone Encounter (Signed)
Rx request sent to pharmacy.  

## 2019-10-14 ENCOUNTER — Other Ambulatory Visit: Payer: Self-pay | Admitting: Cardiovascular Disease

## 2019-11-10 ENCOUNTER — Ambulatory Visit: Payer: Medicare HMO | Admitting: Cardiovascular Disease

## 2019-11-18 ENCOUNTER — Telehealth: Payer: Self-pay | Admitting: Internal Medicine

## 2019-11-18 NOTE — Telephone Encounter (Signed)
Spoke with the pt  He was seen 09/08/19 by Eustaquio Maize and started on daytime o2  Wants POC  Please advise if okay, thanks

## 2019-11-18 NOTE — Telephone Encounter (Signed)
appt scheduled

## 2019-11-18 NOTE — Telephone Encounter (Signed)
Ov for POC qualification next available

## 2019-11-19 ENCOUNTER — Other Ambulatory Visit: Payer: Self-pay

## 2019-11-19 ENCOUNTER — Ambulatory Visit: Payer: Medicare HMO | Admitting: Internal Medicine

## 2019-11-19 ENCOUNTER — Encounter: Payer: Self-pay | Admitting: Internal Medicine

## 2019-11-19 VITALS — BP 128/76 | HR 94 | Temp 97.6°F | Ht 74.0 in | Wt 297.0 lb

## 2019-11-19 DIAGNOSIS — J449 Chronic obstructive pulmonary disease, unspecified: Secondary | ICD-10-CM | POA: Diagnosis not present

## 2019-11-19 DIAGNOSIS — J9611 Chronic respiratory failure with hypoxia: Secondary | ICD-10-CM

## 2019-11-19 MED ORDER — BREZTRI AEROSPHERE 160-9-4.8 MCG/ACT IN AERO: 2.0000 | INHALATION_SPRAY | Freq: Two times a day (BID) | RESPIRATORY_TRACT | 0 refills | Status: DC
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
## 2019-11-19 NOTE — Patient Instructions (Addendum)
Continue breztri Take 2 puffs first thing in am and then another 2 puffs about 12 hours later.   Work on inhaler technique:  relax and gently blow all the way out then take a nice smooth deep breath back in, triggering the inhaler at same time you start breathing in.  Hold for up to 5 seconds if you can. Blow out thru nose. Rinse and gargle with water when done     Try albuterol 15 min before an activity that you know would make you short of breath and see if it makes any difference and if makes none then don't take it after activity unless you can't catch your breath.  Make sure you check your oxygen saturations at highest level of activity to be sure it stays over 90% and adjust upward to maintain this level if needed but remember to turn it back to previous settings when you stop (to conserve your supply).    Keep your June appt - call sooner if needed

## 2019-11-19 NOTE — Assessment & Plan Note (Signed)
Quit smoking 2016 - PFT's  07/29/2019  FEV1 1.80 (54 % ) ratio 0.67  p 3 % improvement from saba p 0 prior to study with DLCO  17.61 (67%) corrects to 4.68 (114%)  for alv volume and FV curve classic curvature   -07/29/2019  try bevespi   - 11/19/2019  After extensive coaching inhaler device,  effectiveness =    50% > continue breztri  Despite suboptimal hfa pt feels better on breztri.   I spent extra time with pt today reviewing appropriate use of albuterol for prn use on exertion with the following points: 1) saba is for relief of sob that does not improve by walking a slower pace or resting but rather if the pt does not improve after trying this first. 2) If the pt is convinced, as many are, that saba helps recover from activity faster then it's easy to tell if this is the case by re-challenging : ie stop, take the inhaler, then p 5 minutes try the exact same activity (intensity of workload) that just caused the symptoms and see if they are substantially diminished or not after saba 3) if there is an activity that reproducibly causes the symptoms, try the saba 15 min before the activity on alternate days   If in fact the saba really does help, then fine to continue to use it prn but advised may need to look closer at the maintenance regimen being used to achieve better control of airways disease with exertion.

## 2019-11-20 ENCOUNTER — Encounter: Payer: Self-pay | Admitting: Internal Medicine

## 2019-11-20 NOTE — Assessment & Plan Note (Signed)
2lpm hs (non adherent) - ONO RA 08/16/19  desat < 88% x 39 min , 40 sec or 13.77% of study so rec 2lpm hs  - 09/08/2019- deat <88%RA x1.5 Laps (approx 350 ft) stopped d/t fatigue. Needs 2L day time oxygen; continue 2L nocturnal oxygen - 11/19/2019 after 1/2 lap o2 sat decreased to 88%ra--placed pt on POC and sats increased to 95 on 2lpm pulsed o2--walked rest of the lap and sats at end were 94% 2lpm//pt walked average pace with walker/stopped multiple times to rest due to legs weak     Advised:  Make sure you check your oxygen saturations at highest level of activity to be sure it stays over 90% and adjust upward to maintain this level if needed but remember to turn it back to previous settings when you stop (to conserve your supply).           Each maintenance medication was reviewed in detail including emphasizing most importantly the difference between maintenance and prns and under what circumstances the prns are to be triggered using an action plan format where appropriate.  Total time for H and P, chart review, counseling, teaching device/  directly observing portions of ambulatory 02 saturation study/  and generating customized AVS unique to this office visit / charting = 30 min

## 2019-11-30 ENCOUNTER — Telehealth: Payer: Self-pay | Admitting: Internal Medicine

## 2019-11-30 NOTE — Telephone Encounter (Signed)
Spoke with Wells Guiles. States that [employer] is needing the pt's qualifying sats so that he can get a POC. Walk test was done and he was qualified on 11/19/19, sats were not placed in the [facility] note. This has been updated. Ortencia Kick that we would get this information to [employer].  [facility] - can you please fax this information to [employer]. Thanks.

## 2019-11-30 NOTE — Telephone Encounter (Signed)
I have faxed this to [employer], however, [employer] has access to Epic so we should not have to fax this info to them.  Rec'd fax confirmation.

## 2020-01-14 ENCOUNTER — Other Ambulatory Visit: Payer: Self-pay | Admitting: Cardiovascular Disease

## 2020-01-16 ENCOUNTER — Other Ambulatory Visit: Payer: Self-pay | Admitting: Cardiovascular Disease

## 2020-01-26 ENCOUNTER — Telehealth: Payer: Self-pay | Admitting: Cardiovascular Disease

## 2020-01-26 NOTE — Telephone Encounter (Signed)
STAT if HR is under 50 or over 120 (normal HR is 60-100 beats per minute)  1) What is your heart rate? 73  2) Do you have a log of your heart rate readings (document readings)? Not a recent log.   3) Do you have any other symptoms? No   Pt states that when he gets up to go into the kitchen from the living room, his HR jumps from the 70s up into the 120s with activity. The pt saw his PCP today and his PCP felt Dr. Gwenlyn Found needed to know this information. The patient wanted to know if he needed to come in sooner to see Dr. Gwenlyn Found or if the appt he is scheduled for 07/07 will suffice

## 2020-01-26 NOTE — Telephone Encounter (Signed)
Returned call to pt he states that he has recently started using O2 but O2 doe not help with his CP. Pt states that this happens with exertion in short distances. Denies any sweating or any other sx. He stats that he thinks that the shortness of breath is from his pulmonary issues but is not sure because he is on 24/7 O2. He states that CP is alleviated with nitro x1 but CP will return every time with exertion. Pt states that sometimes the CP is relieved with rest and sometimes he will take his nitro. Pt states that he thinks that he needs to be seen. Appt scheduled for tomorrow with Dr Gwenlyn Found. Notified pt to go to the ER if CP returns. Verbalizes understanding. I do not feel that he will go to the ER.

## 2020-01-27 ENCOUNTER — Ambulatory Visit: Payer: Medicare HMO | Admitting: Cardiovascular Disease

## 2020-01-27 ENCOUNTER — Encounter: Payer: Self-pay | Admitting: Cardiovascular Disease

## 2020-01-27 ENCOUNTER — Other Ambulatory Visit: Payer: Self-pay

## 2020-01-27 VITALS — BP 119/71 | HR 88 | Ht 74.0 in | Wt 300.2 lb

## 2020-01-27 DIAGNOSIS — F17210 Nicotine dependence, cigarettes, uncomplicated: Secondary | ICD-10-CM

## 2020-01-27 DIAGNOSIS — I10 Essential (primary) hypertension: Secondary | ICD-10-CM | POA: Diagnosis not present

## 2020-01-27 DIAGNOSIS — I5032 Chronic diastolic (congestive) heart failure: Secondary | ICD-10-CM

## 2020-01-27 DIAGNOSIS — R0600 Dyspnea, unspecified: Secondary | ICD-10-CM

## 2020-01-27 DIAGNOSIS — I421 Obstructive hypertrophic cardiomyopathy: Secondary | ICD-10-CM

## 2020-01-27 DIAGNOSIS — E782 Mixed hyperlipidemia: Secondary | ICD-10-CM

## 2020-01-27 DIAGNOSIS — R079 Chest pain, unspecified: Secondary | ICD-10-CM

## 2020-01-27 DIAGNOSIS — R0609 Other forms of dyspnea: Secondary | ICD-10-CM

## 2020-01-27 MED ORDER — METOPROLOL TARTRATE 25 MG PO TABS: 25.0000 mg | ORAL_TABLET | Freq: Two times a day (BID) | ORAL | 6 refills | Status: DC

## 2020-01-27 MED ORDER — CLOPIDOGREL BISULFATE 75 MG PO TABS: 75.0000 mg | ORAL_TABLET | Freq: Every day | ORAL | 1 refills | Status: DC

## 2020-01-27 NOTE — Patient Instructions (Signed)
Medication Instructions:   STOP Brilinta  START Plavix 75 mg daily  INCREASE Metoprolol to 25 mg twice daily.  *If you need a refill on your cardiac medications before your next appointment, please call your pharmacy*   Lab Work: Your physician recommends that you return for a FASTING lipid profile and hepatic function panel today.  If you have labs (blood work) drawn today and your tests are completely normal, you will receive your results only by:  [place] (if you have MyChart) OR  A paper copy in the mail If you have any lab test that is abnormal or we need to change your treatment, we will call you to review the results.   Follow-Up: At [facility], you and your health needs are our priority.  As part of our continuing mission to provide you with exceptional heart care, we have created designated Provider Care Teams.  These Care Teams include your primary Cardiologist (physician) and Advanced Practice Providers (APPs -  Physician Assistants and Nurse Practitioners) who all work together to provide you with the care you need, when you need it.  We recommend signing up for the patient portal called "MyChart".  Sign up information is provided on this After Visit Summary.  MyChart is used to connect with patients for Virtual Visits (Telemedicine).  Patients are able to view lab/test results, encounter notes, upcoming appointments, etc.  Non-urgent messages can be sent to your provider as well.   To learn more about what you can do with MyChart, go to [email or web address].    Your next appointment:   We request that you follow-up in: 3 months with an extender and in 6 months with Dr Andria Rhein will receive a reminder letter in the mail two months in advance. If you don't receive a letter, please call our office to schedule the follow-up appointment.

## 2020-01-27 NOTE — Assessment & Plan Note (Signed)
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

## 2020-01-27 NOTE — Progress Notes (Signed)
01/27/2020 CROCKETT RALLO   Jun 03, 1951  [id number]  Primary Physician Aletha Halim., PA-C Primary Cardiologist: Lorretta Harp MD FACP, [place], [place], [place]  HPI:  Jerry [name] is a 69 y.o.  moderately overweight widowed Caucasian male father of 23, grandfather and 2 grandchildren who has been disabled since 1999.I last saw him in the office  08/12/2019. He is referred by Leonia Reader, PA-C were revascularized evaluation and clearance for his upcoming left total knee replacement. He had seen Dr. Mare Ferrari in the past. He has a history discontinued tobacco abuse, treated hypertension and hyperlipidemia. He apparently had a circumflex obtuse marginal branch stent back in 1997.  He was admitted to [facility] on 07/15/2018 with increasing dyspnea on exertion and lower extreme edema. He was diuresed. 2D echo revealed a moderate decline in LV function from 55 to 60% a year ago down to 40 to 45%. He did undergo a right left heart cath by Dr.Endrevealing a patent AV groove circumflex stent, severe in-stent restenosis within the first obtuse marginal branch stent and moderate mid LAD disease which was stented. He feels clinically improved.  Marland Kitchen He is in a wheelchair because he says his left leg is unstable. He sees Dr. Melvyn Novas for his pulmonary issues. He denies chest pain.  Since I saw him 6 months ago he did have a 2D echo which was unremarkable with normal LV function and a event monitor that showed sinus rhythm with PVCs and occasional runs of nonsustained ventricular tachycardia.  He is on low-dose beta-blocker which we will adjust.  Is also had several episodes of nitro responsive chest pain.   Current Meds  Medication Sig  . acetaminophen (TYLENOL) 650 MG CR tablet Take 650 mg by mouth daily.  Marland Kitchen aspirin EC 81 MG tablet Take 81 mg by mouth daily.  Marland Kitchen atorvastatin (LIPITOR) 80 MG tablet TAKE 1 TABLET BY MOUTH EVERY DAY AT 6PM  . bisacodyl (DULCOLAX) 10 MG  suppository Place 10 mg rectally as needed for moderate constipation.   . Budeson-Glycopyrrol-Formoterol (BREZTRI AEROSPHERE) 160-9-4.8 MCG/ACT AERO Inhale 2 puffs into the lungs 2 (two) times daily.  . Budeson-Glycopyrrol-Formoterol (BREZTRI AEROSPHERE) 160-9-4.8 MCG/ACT AERO Inhale 2 puffs into the lungs 2 (two) times daily.  . Budeson-Glycopyrrol-Formoterol (BREZTRI AEROSPHERE) 160-9-4.8 MCG/ACT AERO Inhale 2 puffs into the lungs 2 (two) times daily.  . calcium carbonate (TUMS - DOSED IN MG ELEMENTAL CALCIUM) 500 MG chewable tablet Chew 1 tablet by mouth daily as needed for indigestion or heartburn.   . cholecalciferol (VITAMIN D) 1000 units tablet Take 1,000 Units by mouth daily.  Marland Kitchen dutasteride (AVODART) 0.5 MG capsule Take 0.5 mg by mouth at bedtime.  Marland Kitchen escitalopram (LEXAPRO) 20 MG tablet Take 20 mg by mouth daily.  . ferrous sulfate 325 (65 FE) MG tablet Take 325 mg by mouth daily.  . furosemide (LASIX) 20 MG tablet Take 2 tablets (40 mg total) by mouth every other day. (Patient taking differently: Take 20-40 mg by mouth daily. 20 mg extra as needed if gain 2 lbs)  . HYDROmorphone (DILAUDID) 8 MG tablet Take 8 mg by mouth every 6 (six) hours as needed for severe pain.   Marland Kitchen losartan (COZAAR) 25 MG tablet TAKE 1 TABLET BY MOUTH EVERY DAY (Patient taking differently: Take 25 mg by mouth daily. )  . metoprolol tartrate (LOPRESSOR) 25 MG tablet Take 1 tablet (25 mg total) by mouth 2 (two) times daily.  . Multiple Vitamin (MULTIVITAMIN WITH MINERALS) TABS tablet  Take 1 tablet by mouth daily. FOR SENIORS  . nitroGLYCERIN (NITROSTAT) 0.4 MG SL tablet DISSOLVE 1 TABLET UNDER TONGUE EVERY 5 MINS AS NEEDED FOR CHEST PAIN  . nortriptyline (PAMELOR) 10 MG capsule Take 40 mg by mouth at bedtime.  . pantoprazole (PROTONIX) 40 MG tablet Take 1 tablet (40 mg total) by mouth 2 (two) times daily.  . polyethylene glycol (MIRALAX / GLYCOLAX) packet Take 17 g by mouth daily.  . potassium chloride (K-DUR,KLOR-CON)  10 MEQ tablet Take 1 tablet (10 mEq total) by mouth every other day. (Patient taking differently: Take 10 mEq by mouth daily. )  . promethazine (PHENERGAN) 25 MG tablet Take 25 mg by mouth 3 (three) times daily as needed for nausea.   Marland Kitchen senna (SENOKOT) 8.6 MG TABS tablet Take 2 tablets by mouth 2 (two) times daily.  . silodosin (RAPAFLO) 8 MG CAPS capsule Take 1 capsule (8 mg total) by mouth daily with breakfast.  . SUMAtriptan (IMITREX) 100 MG tablet Take 100 mg by mouth every 2 (two) hours as needed for migraine. May repeat in 2 hours if headache persists or recurs.  . Tetrahydrozoline HCl (VISINE OP) Place 1 drop into both eyes daily as needed (dry eyes).  Marland Kitchen tiZANidine (ZANAFLEX) 4 MG tablet Take 4 mg by mouth every 6 (six) hours as needed for muscle spasms.  . traZODone (DESYREL) 150 MG tablet Take 300 mg by mouth at bedtime.   . vitamin C (ASCORBIC ACID) 500 MG tablet Take 500 mg by mouth daily.  . [DISCONTINUED] BRILINTA 90 MG TABS tablet TAKE 1 TABLET BY MOUTH TWICE A DAY  . [DISCONTINUED] metoprolol tartrate (LOPRESSOR) 25 MG tablet Take 0.5 tablets (12.5 mg total) by mouth 2 (two) times daily.  . [DISCONTINUED] VENTOLIN HFA 108 (90 Base) MCG/ACT inhaler Inhale 2 puffs into the lungs every 6 (six) hours as needed for shortness of breath.     Allergies  Allergen Reactions  . Ciprofloxacin Anaphylaxis  . Penicillins Rash and Other (See Comments)    UNSPECIFIED SEVERITY Has patient had a PCN reaction causing immediate rash, facial/tongue/throat swelling, SOB or lightheadedness with hypotension:unsure Has patient had a PCN reaction causing severe rash involving mucus membranes or skin necrosis:unsure Has patient had a PCN reaction that required hospitalization:unsure Has patient had a PCN reaction occurring within the last 10 years:NO   . Tramadol Hcl Rash    Social History   Socioeconomic History  . Marital status: Widowed    Spouse name: Not on file  . Number of children: Not on  file  . Years of education: Not on file  . Highest education level: Not on file  Occupational History  . Occupation: Disabled   Tobacco Use  . Smoking status: Former Smoker    Packs/day: 0.25    Years: 33.00    Pack years: 8.25    Types: Cigarettes    Quit date: 05/05/2015    Years since quitting: 4.7  . Smokeless tobacco: Never Used  Vaping Use  . Vaping Use: Never used  Substance and Sexual Activity  . Alcohol use: No    Alcohol/week: 0.0 standard drinks  . Drug use: No  . Sexual activity: Never  Other Topics Concern  . Not on file  Social History Narrative  . Not on file   Social Determinants of Health   Financial Resource Strain:   . Difficulty of Paying Living Expenses:   Food Insecurity:   . Worried About Charity fundraiser in the Last  Year:   . Ran Out of Food in the Last Year:   Transportation Needs:   . Film/video editor (Medical):   Marland Kitchen Lack of Transportation (Non-Medical):   Physical Activity:   . Days of Exercise per Week:   . Minutes of Exercise per Session:   Stress:   . Feeling of Stress :   Social Connections:   . Frequency of Communication with Friends and Family:   . Frequency of Social Gatherings with Friends and Family:   . Attends Religious Services:   . Active Member of Clubs or Organizations:   . Attends Archivist Meetings:   Marland Kitchen Marital Status:   Intimate Partner Violence:   . Fear of Current or Ex-Partner:   . Emotionally Abused:   Marland Kitchen Physically Abused:   . Sexually Abused:      Review of Systems: General: negative for chills, fever, night sweats or weight changes.  Cardiovascular: negative for chest pain, dyspnea on exertion, edema, orthopnea, palpitations, paroxysmal nocturnal dyspnea or shortness of breath Dermatological: negative for rash Respiratory: negative for cough or wheezing Urologic: negative for hematuria Abdominal: negative for nausea, vomiting, diarrhea, bright red blood per rectum, melena, or  hematemesis Neurologic: negative for visual changes, syncope, or dizziness All other systems reviewed and are otherwise negative except as noted above.    Blood pressure 119/71, pulse 88, height 6\' 2"  (1.88 m), weight (!) 300 lb 3.2 oz (136.2 kg), SpO2 97 %.  General appearance: alert and no distress Neck: no adenopathy, no carotid bruit, no JVD, supple, symmetrical, trachea midline and thyroid not enlarged, symmetric, no tenderness/mass/nodules Lungs: clear to auscultation bilaterally Heart: regular rate and rhythm, S1, S2 normal, no murmur, click, rub or gallop Extremities: 2+ pitting edema Pulses: 2+ and symmetric Skin: Skin color, texture, turgor normal. No rashes or lesions Neurologic: Alert and oriented X 3, normal strength and tone. Normal symmetric reflexes. Normal coordination and gait  EKG sinus rhythm at 88 with nonspecific ST and T wave changes. I Personally reviewed this EKG.  ASSESSMENT AND PLAN:   CAD (coronary artery disease) History of CAD status post remote circumflex stenting back in 1997.  Cardiac cath performed by Dr. Saunders Revel 07/16/2018 revealed a subtotally occluded circumflex and obtuse marginal branch stent, insignificant AV groove circumflex stenosis with significant mid LAD stenosis which was stented with a 3 mm x 12 mm Synergy drug-eluting stent to the RCA was free of disease.  Symptoms improved.  He does get occasional nitrate responsive angina.  Cigarette smoker Discontinue tobacco abuse 6 years ago.  He does see Dr. Melvyn Novas , for pulmonology issues.  He now is on continuous home O2 at 2 L and feels clinically improved.  Essential hypertension History of essential hypertension a blood pressure measured today 119/71 with a pulse of 88.  He is on losartan and metoprolol.  HLD (hyperlipidemia) History of hyperlipidemia on statin therapy with lipid profile performed 08/29/2017 revealing total cholesterol 142, LDL 70 and HDL 40.  We will recheck a fasting lipid liver  profile today.  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      Lorretta Harp MD FACP,FACC,FAHA, [facility] 01/27/2020 12:14 PM

## 2020-01-27 NOTE — Assessment & Plan Note (Signed)
History of essential hypertension a blood pressure measured today 119/71 with a pulse of 88.  He is on losartan and metoprolol.

## 2020-01-27 NOTE — Assessment & Plan Note (Signed)
History of CAD status post remote circumflex stenting back in 1997.  Cardiac cath performed by Dr. Saunders Revel 07/16/2018 revealed a subtotally occluded circumflex and obtuse marginal branch stent, insignificant AV groove circumflex stenosis with significant mid LAD stenosis which was stented with a 3 mm x 12 mm Synergy drug-eluting stent to the RCA was free of disease.  Symptoms improved.  He does get occasional nitrate responsive angina.

## 2020-01-27 NOTE — Assessment & Plan Note (Signed)
Discontinue tobacco abuse 6 years ago.  He does see Dr. Melvyn Novas , for pulmonology issues.  He now is on continuous home O2 at 2 L and feels clinically improved.

## 2020-01-27 NOTE — Assessment & Plan Note (Signed)
History of hyperlipidemia on statin therapy with lipid profile performed 08/29/2017 revealing total cholesterol 142, LDL 70 and HDL 40.  We will recheck a fasting lipid liver profile today.

## 2020-01-28 ENCOUNTER — Encounter: Payer: Self-pay | Admitting: Internal Medicine

## 2020-01-28 ENCOUNTER — Ambulatory Visit: Payer: Medicare HMO | Admitting: Internal Medicine

## 2020-01-28 DIAGNOSIS — J449 Chronic obstructive pulmonary disease, unspecified: Secondary | ICD-10-CM | POA: Diagnosis not present

## 2020-01-28 DIAGNOSIS — J9611 Chronic respiratory failure with hypoxia: Secondary | ICD-10-CM | POA: Diagnosis not present

## 2020-01-28 LAB — LIPID PANEL
Chol/HDL Ratio: 2.8 ratio (ref 0.0–5.0)
Cholesterol, Total: 123 mg/dL (ref 100–199)
HDL: 44 mg/dL (ref >39)
LDL Chol Calc (NIH): 51 mg/dL (ref 0–99)
Triglycerides: 168 mg/dL — ABNORMAL HIGH (ref 0–149)
VLDL Cholesterol Cal: 28 mg/dL (ref 5–40)

## 2020-01-28 LAB — HEPATIC FUNCTION PANEL
ALT: 25 IU/L (ref 0–44)
AST: 26 IU/L (ref 0–40)
Albumin: 4.1 g/dL (ref 3.8–4.8)
Alkaline Phosphatase: 71 IU/L (ref 48–121)
Bilirubin Total: 0.3 mg/dL (ref 0.0–1.2)
Bilirubin, Direct: 0.11 mg/dL (ref 0.00–0.40)
Total Protein: 7 g/dL (ref 6.0–8.5)

## 2020-01-28 MED ORDER — BREZTRI AEROSPHERE 160-9-4.8 MCG/ACT IN AERO: 2.0000 | INHALATION_SPRAY | Freq: Two times a day (BID) | RESPIRATORY_TRACT | 0 refills | Status: DC

## 2020-01-28 MED ORDER — ALBUTEROL SULFATE HFA 108 (90 BASE) MCG/ACT IN AERS: INHALATION_SPRAY | RESPIRATORY_TRACT | 11 refills | Status: DC

## 2020-01-28 MED ORDER — BREZTRI AEROSPHERE 160-9-4.8 MCG/ACT IN AERO: 2.0000 | INHALATION_SPRAY | Freq: Two times a day (BID) | RESPIRATORY_TRACT | 11 refills | Status: DC

## 2020-01-28 NOTE — Patient Instructions (Addendum)
Plan A = Automatic = Always=    Breztri Take 2 puffs first thing in am and then another 2 puffs about 12 hours later.   Call if can't afford it on your plan (trelegy would be an option we could call you in)   Plan B = Backup (to supplement plan A, not to replace it) Only use your albuterol inhaler as a rescue medication to be used if you can't catch your breath by resting or doing a relaxed purse lip breathing pattern.  - The less you use it, the better it will work when you need it. - Ok to use the inhaler up to 2 puffs  every 4 hours if you must but call for appointment if use goes up over your usual need - Don't leave home without it !!  (think of it like the spare tire for your car)   Plan C = Crisis (instead of Plan B but only if Plan B stops working) - only use your albuterol nebulizer if you first try Plan B and it fails to help > ok to use the nebulizer up to every 4 hours but if start needing it regularly call for immediate appointment   Strongly urge you to get the vaccine asap per your drugstore   Please schedule a follow up visit in 6  months but call sooner if needed
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
## 2020-01-29 ENCOUNTER — Encounter: Payer: Self-pay | Admitting: Internal Medicine

## 2020-01-29 NOTE — Assessment & Plan Note (Addendum)
02 rx 2lpm  - ONO RA 08/16/19  desat < 88% x 39 min , 40 sec or 13.77% of study so rec 2lpm hs  - 09/08/2019- deat <88%RA x1.5 Laps (approx 350 ft) stopped d/t fatigue. Needs 2L day time oxygen; continue 2L nocturnal oxygen - 11/19/2019 after 1/2 lap o2 sat decreased to 88%ra--placed pt on POC and sats increased to 95 on 2lpm pulsed o2--walked rest of the lap and sats at end were 94% 2lpm//pt walked average pace with walker/stopped multiple times to rest due to legs weak    rec 2lpm but advised Make sure you check your oxygen saturations at highest level of activity to be sure it stays over 90% and adjust upward to maintain this level if needed but remember to turn it back to previous settings when you stop (to conserve your supply).           Each maintenance medication was reviewed in detail including emphasizing most importantly the difference between maintenance and prns and under what circumstances the prns are to be triggered using an action plan format where appropriate.  Total time for H and P, chart review, counseling, teaching device and generating customized AVS unique to this office visit / charting = 30 min

## 2020-01-29 NOTE — Assessment & Plan Note (Signed)
Quit smoking 2016 - PFT's  07/29/2019  FEV1 1.80 (54 % ) ratio 0.67  p 3 % improvement from saba p 0 prior to study with DLCO  17.61 (67%) corrects to 4.68 (114%)  for alv volume and FV curve classic curvature   -07/29/2019  try bevespi   - 01/28/2020  After extensive coaching inhaler device,  effectiveness =    75% > continue breztri if affordable but also ok to use elipta as tried it before    Group D in terms of symptom/risk and laba/lama/ICS  therefore appropriate rx at this point >>>  Continue breztri or trelegy if insurance prefers  Advised:  formulary restrictions will be an ongoing challenge for the forseable future and I would be happy to pick an alternative if the pt will first  provide me a list of them -  pt  will need to return here for training for any new device that is required eg dpi vs hfa vs respimat.    In the meantime we can always provide samples so that the patient never runs out of any needed respiratory medications.    Also:  Pt informed of the seriousness of COVID 19 infection as a direct risk to lung health  and safey and to close contacts and should continue to wear a facemask in public and minimize exposure to public locations but especially avoid any area or activity where non-close contacts are not observing distancing or wearing an appropriate face mask.  I strongly recommended she take either of the vaccines available through local drugstores based on updated information on millions of Americans treated with the [place] products  which have proven both safe and  effective even against the new delta variant.

## 2020-02-10 ENCOUNTER — Ambulatory Visit: Payer: Medicare HMO | Admitting: Cardiovascular Disease

## 2020-03-07 ENCOUNTER — Other Ambulatory Visit: Payer: Self-pay | Admitting: Cardiovascular Disease

## 2020-04-15 ENCOUNTER — Other Ambulatory Visit: Payer: Self-pay
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
## 2020-04-18 ENCOUNTER — Ambulatory Visit: Payer: Medicare HMO | Admitting: Student

## 2020-04-22 ENCOUNTER — Ambulatory Visit: Payer: Medicare HMO | Admitting: Physician Assistant

## 2020-04-25 ENCOUNTER — Other Ambulatory Visit: Payer: Self-pay | Admitting: Thoracic Surgery (Cardiothoracic Vascular Surgery)

## 2020-04-25 ENCOUNTER — Other Ambulatory Visit: Payer: Self-pay | Admitting: Cardiovascular Disease

## 2020-04-25 DIAGNOSIS — R0782 Intercostal pain: Secondary | ICD-10-CM

## 2020-05-02 ENCOUNTER — Telehealth: Payer: Self-pay | Admitting: Internal Medicine

## 2020-05-02 NOTE — Telephone Encounter (Signed)
ATC Patient x's 2 on both listed numbers.  Mobile number rings,no answer, and VM has not been set up.  Home number x's 2 is busy, unable to leave a message.

## 2020-05-02 NOTE — Telephone Encounter (Signed)
Attempted to call pt but unable to reach and unable to leave VM due to mailbox not being set up.

## 2020-05-03 MED ORDER — PREDNISONE 10 MG PO TABS: 10.0000 mg | ORAL_TABLET | Freq: Every day | ORAL | 0 refills | Status: DC

## 2020-05-03 MED ORDER — AZITHROMYCIN 250 MG PO TABS: ORAL_TABLET | ORAL | 0 refills | Status: DC

## 2020-05-03 NOTE — Telephone Encounter (Signed)
Primary Pulmonologist: Dr. Melvyn Novas  Last office visit and with whom: 01/28/20 with MW  What do we see them for (pulmonary problems): COPD Last OV assessment/plan:  Was appointment offered to patient (explain)?  Wants receommendations   Reason for call: patient has noticed in the last 2 weeks his oxygen dropping to 85% when walking around the house and when he goes outside it drops to 79% on 2L continuous oxygen.  Patient has had cough with white phlegm and headaches. Denies Fever   I asked patient if he has adjusted his oxygen up and he states no , so no way to tell what is an acceptable liter flow for ambulation at this time.  Dr. Melvyn Novas please advise  Allergies  Allergen Reactions  . Ciprofloxacin Anaphylaxis  . Penicillins Rash and Other (See Comments)    UNSPECIFIED SEVERITY Has patient had a PCN reaction causing immediate rash, facial/tongue/throat swelling, SOB or lightheadedness with hypotension:unsure Has patient had a PCN reaction causing severe rash involving mucus membranes or skin necrosis:unsure Has patient had a PCN reaction that required hospitalization:unsure Has patient had a PCN reaction occurring within the last 10 years:NO   . Tramadol Hcl Rash    Immunization History  Administered Date(s) Administered  . Influenza, High Dose Seasonal PF 04/23/2017, 04/29/2018, 06/30/2019  . Pneumococcal Conjugate-13 04/23/2017  . Pneumococcal Polysaccharide-23 07/16/2013  . Tdap 11/22/2011, 07/28/2019

## 2020-05-03 NOTE — Telephone Encounter (Signed)
Try zpak and Prednisone 10 mg take  4 each am x 2 days,   2 each am x 2 days,  1 each am x 2 days and stop   Already advised at last ov: Make sure you check your oxygen saturations at highest level of activity to be sure it stays over 90% and adjust  02 flow upward to maintain this level if needed but remember to turn it back to previous settings when you stop (to conserve your supply).   Set up f/u in 2 weeks with me or np with all meds/ 02 device with him to recheck

## 2020-05-03 NOTE — Telephone Encounter (Signed)
Spoke with patient regarding prior message.Advised patient per Dr.Wert Try zpak and Prednisone 10 mg take  4 each am x 2 days,   2 each am x 2 days,  1 each am x 2 days and stop .Set into patient's pharmacy   Make sure you check your oxygen saturations at highest level of activity to be sure it stays over 90% and adjust  02 flow upward to maintain this level if needed but remember to turn it back to previous settings when you stop (to conserve your supply).   Set up f/u in 2 weeks with me or np with all meds/ 02 device with him to recheck.  Made patient a 2 weeks f/u with Aaron Edelman om 05/23/20 at 11:00. Patient's voice was understanding. Noting else further needed.

## 2020-05-12 ENCOUNTER — Other Ambulatory Visit: Payer: Self-pay | Admitting: Thoracic Surgery (Cardiothoracic Vascular Surgery)

## 2020-05-14 ENCOUNTER — Other Ambulatory Visit: Payer: Self-pay | Admitting: Cardiovascular Disease

## 2020-05-23 ENCOUNTER — Ambulatory Visit: Payer: Medicare HMO | Admitting: Pulmonary Disease

## 2020-05-24 ENCOUNTER — Other Ambulatory Visit: Payer: Medicare HMO

## 2020-05-26 ENCOUNTER — Telehealth: Payer: Self-pay | Admitting: Cardiovascular Disease

## 2020-05-26 NOTE — Telephone Encounter (Signed)
Spoke with pt and advised he is currently scheduled to see Dr Roxan Hockey on 05/31/2020 at 4pm for history and physical prior to 06/09/2020 CT.  Pt states he was unaware of this appointment.  Pt advised Dr Roxan Hockey should determine at that time if pt is eligible for the anesthesia and will consult with additional providers if needed.    Will forward information to Dr Gwenlyn Found to make him aware of pending appointments and testing as well. Pt verbalizes understanding and agrees with current plan.

## 2020-05-26 NOTE — Telephone Encounter (Signed)
Patient is scheduled to have a CT on 06/09/20 and he would like to know if he is eligible for anesthesia. Please advise.

## 2020-05-31 ENCOUNTER — Ambulatory Visit: Payer: Medicare HMO | Admitting: Thoracic Surgery (Cardiothoracic Vascular Surgery)

## 2020-05-31 ENCOUNTER — Other Ambulatory Visit: Payer: Self-pay

## 2020-05-31 ENCOUNTER — Encounter: Payer: Self-pay | Admitting: Thoracic Surgery (Cardiothoracic Vascular Surgery)

## 2020-05-31 VITALS — BP 94/61 | HR 77 | Temp 97.5°F | Resp 20 | Ht 74.0 in | Wt 305.0 lb

## 2020-05-31 DIAGNOSIS — C413 Malignant neoplasm of ribs, sternum and clavicle: Secondary | ICD-10-CM | POA: Diagnosis not present
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
## 2020-06-06 ENCOUNTER — Other Ambulatory Visit (HOSPITAL_COMMUNITY)
Admission: RE | Admit: 2020-06-06 | Discharge: 2020-06-06 | Disposition: A | Discharge status: Home / Self Care | Payer: Medicare HMO | Source: Ambulatory Visit | Attending: Thoracic Surgery (Cardiothoracic Vascular Surgery) | Admitting: Thoracic Surgery (Cardiothoracic Vascular Surgery)

## 2020-06-06 DIAGNOSIS — Z01818 Encounter for other preprocedural examination: Secondary | ICD-10-CM | POA: Insufficient documentation

## 2020-06-06 DIAGNOSIS — Z20822 Contact with and (suspected) exposure to covid-19: Secondary | ICD-10-CM | POA: Diagnosis not present

## 2020-06-07 LAB — SARS CORONAVIRUS 2 (TAT 6-24 HRS): SARS Coronavirus 2: NEGATIVE

## 2020-06-08 ENCOUNTER — Other Ambulatory Visit: Payer: Self-pay

## 2020-06-08 ENCOUNTER — Encounter (HOSPITAL_COMMUNITY): Payer: Self-pay | Admitting: Vascular Surgery

## 2020-06-08 NOTE — Progress Notes (Signed)
Patient denies fever, cough or chest pain.  PCP - Bing Matter, PA-C Cardiologist - Dr Gwenlyn Found Pulmonology - Dr Melvyn Novas  Chest x-ray - n/a  EKG - 01/29/20 Stress Test - n/a ECHO - 08/26/19 Cardiac Cath - 07/17/18  Anesthesia: Yes   Coronavirus Screening Covid test on 06/06/20 was negative.  Patient verbalized understanding of instructions that were given via phone.

## 2020-06-08 NOTE — Anesthesia Preprocedure Evaluation (Addendum)
Anesthesia Evaluation  Patient identified by MRN, date of birth, ID band Patient awake    Reviewed: Allergy & Precautions, NPO status , Patient's Chart, lab work & pertinent test results  Airway Mallampati: II  TM Distance: >3 FB Neck ROM: Full    Dental  (+) Edentulous Upper, Edentulous Lower   Pulmonary shortness of breath, COPD,  COPD inhaler and oxygen dependent, former smoker,  Osteosarcoma of R chest: rib resection via thoracotomy, complicated by hemothorax, has has flap closure with chest wall reconstruction   Pulmonary exam normal breath sounds clear to auscultation       Cardiovascular hypertension, Pt. on medications and Pt. on home beta blockers + CAD, + Past MI, + Cardiac Stents, + Peripheral Vascular Disease, +CHF and + DOE  Normal cardiovascular exam Rhythm:Regular Rate:Normal  ECG: NSR, rate 88  08/2019 ECHO: EF 55-60%, mild-mod aortic sclerosis without stenosis   Neuro/Psych  Headaches, PSYCHIATRIC DISORDERS Anxiety Depression Chronic back pain    GI/Hepatic hiatal hernia, GERD  Medicated and Controlled,  Endo/Other    Renal/GU      Musculoskeletal  (+) Arthritis , Chronic lower back pain   Abdominal (+) + obese,   Peds  Hematology  (+) anemia , HLD   Anesthesia Other Findings   Reproductive/Obstetrics                           Anesthesia Physical Anesthesia Plan  ASA: IV  Anesthesia Plan: MAC   Post-op Pain Management:    Induction: Intravenous  PONV Risk Score and Plan: 1 and Ondansetron, Dexamethasone, Midazolam and Treatment may vary due to age or medical condition  Airway Management Planned: Simple Face Mask  Additional Equipment:   Intra-op Plan:   Post-operative Plan:   Informed Consent: I have reviewed the patients History and Physical, chart, labs and discussed the procedure including the risks, benefits and alternatives for the proposed anesthesia with  the patient or authorized representative who has indicated his/her understanding and acceptance.       Plan Discussed with: CRNA  Anesthesia Plan Comments: (Reviewed PAT note written 06/08/2020 by Myra Gianotti, PA-C. )      Anesthesia Quick Evaluation
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
## 2020-06-09 ENCOUNTER — Ambulatory Visit (HOSPITAL_COMMUNITY): Payer: Medicare HMO | Admitting: Vascular Surgery

## 2020-06-09 ENCOUNTER — Ambulatory Visit (HOSPITAL_COMMUNITY)
Admission: RE | Admit: 2020-06-09 | Discharge: 2020-06-09 | Disposition: A | Discharge status: Home / Self Care | Payer: Medicare HMO | Source: Ambulatory Visit | Attending: Thoracic Surgery (Cardiothoracic Vascular Surgery) | Admitting: Thoracic Surgery (Cardiothoracic Vascular Surgery)

## 2020-06-09 ENCOUNTER — Encounter (HOSPITAL_COMMUNITY)
Admission: RE | Disposition: A | Discharge status: Home / Self Care | Payer: Self-pay | Source: Ambulatory Visit | Attending: Thoracic Surgery (Cardiothoracic Vascular Surgery)

## 2020-06-09 ENCOUNTER — Other Ambulatory Visit: Payer: Self-pay

## 2020-06-09 ENCOUNTER — Encounter (HOSPITAL_COMMUNITY): Payer: Self-pay | Admitting: Thoracic Surgery (Cardiothoracic Vascular Surgery)

## 2020-06-09 DIAGNOSIS — J948 Other specified pleural conditions: Secondary | ICD-10-CM | POA: Insufficient documentation

## 2020-06-09 DIAGNOSIS — R911 Solitary pulmonary nodule: Secondary | ICD-10-CM | POA: Diagnosis not present

## 2020-06-09 DIAGNOSIS — N62 Hypertrophy of breast: Secondary | ICD-10-CM | POA: Insufficient documentation

## 2020-06-09 DIAGNOSIS — R0782 Intercostal pain: Secondary | ICD-10-CM

## 2020-06-09 DIAGNOSIS — I2510 Atherosclerotic heart disease of native coronary artery without angina pectoris: Secondary | ICD-10-CM | POA: Insufficient documentation

## 2020-06-09 DIAGNOSIS — Z6839 Body mass index (BMI) 39.0-39.9, adult: Secondary | ICD-10-CM | POA: Insufficient documentation

## 2020-06-09 DIAGNOSIS — Z87891 Personal history of nicotine dependence: Secondary | ICD-10-CM | POA: Insufficient documentation

## 2020-06-09 DIAGNOSIS — R0789 Other chest pain: Secondary | ICD-10-CM | POA: Diagnosis not present

## 2020-06-09 DIAGNOSIS — Z85830 Personal history of malignant neoplasm of bone: Secondary | ICD-10-CM | POA: Diagnosis not present

## 2020-06-09 HISTORY — PX: RADIOLOGY WITH ANESTHESIA: SHX6223

## 2020-06-09 HISTORY — DX: Heart failure, unspecified: I50.9

## 2020-06-09 LAB — BASIC METABOLIC PANEL
Anion gap: 12 (ref 5–15)
BUN: 11 mg/dL (ref 8–23)
CO2: 33 mmol/L — ABNORMAL HIGH (ref 22–32)
Calcium: 9.4 mg/dL (ref 8.9–10.3)
Chloride: 95 mmol/L — ABNORMAL LOW (ref 98–111)
Creatinine, Ser: 1.11 mg/dL (ref 0.61–1.24)
GFR, Estimated: >60 mL/min (ref >60)
Glucose, Bld: 143 mg/dL — ABNORMAL HIGH (ref 70–99)
Potassium: 3.8 mmol/L (ref 3.5–5.1)
Sodium: 140 mmol/L (ref 135–145)

## 2020-06-09 LAB — CBC
HCT: 37.3 % — ABNORMAL LOW (ref 39.0–52.0)
Hemoglobin: 11 g/dL — ABNORMAL LOW (ref 13.0–17.0)
MCH: 27.2 pg (ref 26.0–34.0)
MCHC: 29.5 g/dL — ABNORMAL LOW (ref 30.0–36.0)
MCV: 92.3 fL (ref 80.0–100.0)
Platelets: 223 10*3/uL (ref 150–400)
RBC: 4.04 MIL/uL — ABNORMAL LOW (ref 4.22–5.81)
RDW: 15.2 % (ref 11.5–15.5)
WBC: 6.6 10*3/uL (ref 4.0–10.5)
nRBC: 0 % (ref 0.0–0.2)

## 2020-06-09 SURGERY — RADIOLOGY WITH ANESTHESIA
Anesthesia: Monitor Anesthesia Care

## 2020-06-09 MED ORDER — FENTANYL CITRATE (PF) 250 MCG/5ML IJ SOLN
INTRAMUSCULAR | Status: AC
  Filled 2020-06-09: qty 5

## 2020-06-09 MED ORDER — FENTANYL CITRATE (PF) 100 MCG/2ML IJ SOLN
INTRAMUSCULAR | Status: DC | PRN
  Administered 2020-06-09 (×2): 50 ug via INTRAVENOUS

## 2020-06-09 MED ORDER — PROPOFOL 10 MG/ML IV BOLUS
INTRAVENOUS | Status: AC
  Filled 2020-06-09: qty 20

## 2020-06-09 MED ORDER — ONDANSETRON HCL 4 MG/2ML IJ SOLN: 4.0000 mg | Freq: Once | INTRAMUSCULAR | Status: DC | PRN

## 2020-06-09 MED ORDER — CHLORHEXIDINE GLUCONATE 0.12 % MT SOLN
OROMUCOSAL | Status: AC
  Administered 2020-06-09: 15 mL via OROMUCOSAL
  Filled 2020-06-09: qty 15

## 2020-06-09 MED ORDER — CHLORHEXIDINE GLUCONATE 0.12 % MT SOLN: 15.0000 mL | Freq: Once | OROMUCOSAL | Status: AC

## 2020-06-09 MED ORDER — DEXMEDETOMIDINE (PRECEDEX) IN NS 20 MCG/5ML (4 MCG/ML) IV SYRINGE
PREFILLED_SYRINGE | INTRAVENOUS | Status: DC | PRN
  Administered 2020-06-09: 12 ug via INTRAVENOUS
  Administered 2020-06-09: 16 ug via INTRAVENOUS

## 2020-06-09 MED ORDER — MIDAZOLAM HCL 2 MG/2ML IJ SOLN
INTRAMUSCULAR | Status: AC
  Filled 2020-06-09: qty 2

## 2020-06-09 MED ORDER — ORAL CARE MOUTH RINSE: 15.0000 mL | Freq: Once | OROMUCOSAL | Status: AC

## 2020-06-09 MED ORDER — PROPOFOL 500 MG/50ML IV EMUL
INTRAVENOUS | Status: DC | PRN
  Administered 2020-06-09: 100 ug/kg/min via INTRAVENOUS

## 2020-06-09 MED ORDER — MIDAZOLAM HCL 5 MG/5ML IJ SOLN
INTRAMUSCULAR | Status: DC | PRN
  Administered 2020-06-09: 2 mg via INTRAVENOUS

## 2020-06-09 MED ORDER — ACETAMINOPHEN 10 MG/ML IV SOLN: 1000.0000 mg | Freq: Once | INTRAVENOUS | Status: DC | PRN

## 2020-06-09 MED ORDER — LACTATED RINGERS IV SOLN: INTRAVENOUS | Status: DC

## 2020-06-09 MED ORDER — FENTANYL CITRATE (PF) 100 MCG/2ML IJ SOLN: 25.0000 ug | INTRAMUSCULAR | Status: DC | PRN

## 2020-06-09 NOTE — Anesthesia Procedure Notes (Signed)
Procedure Name: MAC Date/Time: 06/09/2020 11:03 AM Performed by: Kyung Rudd, CRNA Pre-anesthesia Checklist: Patient identified, Emergency Drugs available, Suction available and Patient being monitored Patient Re-evaluated:Patient Re-evaluated prior to induction Oxygen Delivery Method: Nasal cannula Preoxygenation: Pre-oxygenation with 100% oxygen Induction Type: IV induction Placement Confirmation: positive ETCO2 Dental Injury: Teeth and Oropharynx as per pre-operative assessment

## 2020-06-09 NOTE — Anesthesia Postprocedure Evaluation (Signed)
Anesthesia Post Note  Patient: Jerry [name]  Procedure(s) Performed: RADIOLOGY WITH ANESTHESIA  CT OF THE CHEST WIHTOUT CONTRAST (N/A )     Patient location during evaluation: PACU Anesthesia Type: MAC Level of consciousness: awake and alert Pain management: pain level controlled Vital Signs Assessment: post-procedure vital signs reviewed and stable Respiratory status: spontaneous breathing, nonlabored ventilation, respiratory function stable and patient connected to nasal cannula oxygen Cardiovascular status: stable and blood pressure returned to baseline Postop Assessment: no apparent nausea or vomiting Anesthetic complications: no   No complications documented.  Last Vitals:  Vitals:   06/09/20 1205 06/09/20 1211  BP: (!) 100/54 (!) 107/55  Pulse: 60 61  Resp: 13 13  Temp:  36.7 C  SpO2: 94% 93%    Last Pain:  Vitals:   06/09/20 1135  TempSrc:   PainSc: 4                  Joseline Mccampbell P Rogina Schiano

## 2020-06-09 NOTE — Transfer of Care (Signed)
Immediate Anesthesia Transfer of Care Note  Patient: Jerry [name]  Procedure(s) Performed: RADIOLOGY WITH ANESTHESIA  CT OF THE CHEST WIHTOUT CONTRAST (N/A )  Patient Location: PACU  Anesthesia Type:MAC  Level of Consciousness: awake, alert  and oriented  Airway & Oxygen Therapy: Patient Spontanous Breathing and Patient connected to nasal cannula oxygen  Post-op Assessment: Report given to RN, Post -op Vital signs reviewed and stable and Patient moving all extremities X 4  Post vital signs: Reviewed and stable  Last Vitals:  Vitals Value Taken Time  BP 112/54 06/09/20 1135  Temp 36.3 C 06/09/20 1135  Pulse 64 06/09/20 1141  Resp 12 06/09/20 1141  SpO2 91 % 06/09/20 1141  Vitals shown include unvalidated device data.  Last Pain:  Vitals:   06/09/20 1135  TempSrc:   PainSc: 4          Complications: No complications documented.

## 2020-06-10 ENCOUNTER — Encounter (HOSPITAL_COMMUNITY): Payer: Self-pay | Admitting: Radiology
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
## 2020-06-14 ENCOUNTER — Ambulatory Visit: Payer: Medicare HMO | Admitting: Thoracic Surgery (Cardiothoracic Vascular Surgery)

## 2020-06-28 ENCOUNTER — Encounter: Payer: Self-pay | Admitting: Thoracic Surgery (Cardiothoracic Vascular Surgery)

## 2020-06-28 ENCOUNTER — Ambulatory Visit: Payer: Medicare HMO | Admitting: Thoracic Surgery (Cardiothoracic Vascular Surgery)

## 2020-06-28 ENCOUNTER — Other Ambulatory Visit: Payer: Self-pay

## 2020-06-28 VITALS — BP 113/72 | HR 71 | Resp 18 | Ht 74.0 in | Wt 297.0 lb

## 2020-06-28 DIAGNOSIS — C413 Malignant neoplasm of ribs, sternum and clavicle: Secondary | ICD-10-CM | POA: Diagnosis not present
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
## 2020-07-18 ENCOUNTER — Telehealth: Payer: Self-pay | Admitting: Internal Medicine

## 2020-07-18 MED ORDER — BREZTRI AEROSPHERE 160-9-4.8 MCG/ACT IN AERO: 2.0000 | INHALATION_SPRAY | Freq: Two times a day (BID) | RESPIRATORY_TRACT | 0 refills | Status: DC

## 2020-07-18 NOTE — Telephone Encounter (Signed)
Yes can have a sample of BREZTRI  Can  put at the front desk if we have samples.  We will discuss cost of medication on return visit in January as planned with Dr. Melvyn Novas

## 2020-07-18 NOTE — Telephone Encounter (Signed)
Called and spoke with patient. I asked him if this was a new medication or if he was always having to pay $198. Patient stated that this is the first time he has ran into this issue. Believe patient is in the donut hole and will start over beginning of the year. We have samples that patient can come and pick up. Advised patient that I will place them up front for pick up and to let us know if he needs anything else. Patient expressed understanding. Nothing further needed at this time.

## 2020-07-19 ENCOUNTER — Ambulatory Visit: Payer: Medicare HMO | Admitting: Cardiovascular Disease

## 2020-07-19 ENCOUNTER — Encounter: Payer: Self-pay | Admitting: Cardiovascular Disease

## 2020-07-19 ENCOUNTER — Other Ambulatory Visit: Payer: Self-pay

## 2020-07-19 DIAGNOSIS — I421 Obstructive hypertrophic cardiomyopathy: Secondary | ICD-10-CM

## 2020-07-19 DIAGNOSIS — R601 Generalized edema: Secondary | ICD-10-CM

## 2020-07-19 DIAGNOSIS — I2510 Atherosclerotic heart disease of native coronary artery without angina pectoris: Secondary | ICD-10-CM | POA: Diagnosis not present

## 2020-07-19 DIAGNOSIS — I10 Essential (primary) hypertension: Secondary | ICD-10-CM | POA: Diagnosis not present

## 2020-07-19 DIAGNOSIS — F17210 Nicotine dependence, cigarettes, uncomplicated: Secondary | ICD-10-CM | POA: Diagnosis not present

## 2020-07-19 DIAGNOSIS — E782 Mixed hyperlipidemia: Secondary | ICD-10-CM | POA: Diagnosis not present

## 2020-07-19 DIAGNOSIS — I5032 Chronic diastolic (congestive) heart failure: Secondary | ICD-10-CM

## 2020-07-19 NOTE — Assessment & Plan Note (Signed)
1-2+ pitting edema probably related to diastolic heart failure and recent UTI.  He is on a diuretic.

## 2020-07-19 NOTE — Assessment & Plan Note (Signed)
History of essential hypertension a blood pressure measured today 130/64.  He is on losartan and metoprolol.

## 2020-07-19 NOTE — Assessment & Plan Note (Signed)
History of diastolic heart failure with 2D echo performed 08/26/2019 revealing normal LV systolic function.  He is on oral diuretic.

## 2020-07-19 NOTE — Assessment & Plan Note (Signed)
Remote tobacco abuse having quit 6 years ago

## 2020-07-19 NOTE — Patient Instructions (Signed)
Medication Instructions:  Your physician recommends that you continue on your current medications as directed. Please refer to the Current Medication list given to you today.  *If you need a refill on your cardiac medications before your next appointment, please call your pharmacy*   Follow-Up: At [facility], you and your health needs are our priority.  As part of our continuing mission to provide you with exceptional heart care, we have created designated Provider Care Teams.  These Care Teams include your primary Cardiologist (physician) and Advanced Practice Providers (APPs -  Physician Assistants and Nurse Practitioners) who all work together to provide you with the care you need, when you need it.  We recommend signing up for the patient portal called "MyChart".  Sign up information is provided on this After Visit Summary.  MyChart is used to connect with patients for Virtual Visits (Telemedicine).  Patients are able to view lab/test results, encounter notes, upcoming appointments, etc.  Non-urgent messages can be sent to your provider as well.   To learn more about what you can do with MyChart, go to [email or web address].    Your next appointment:   6 month(s)  The format for your next appointment:   In Person  Provider:   You will see one of the following Advanced Practice Providers on your designated Care Team:    Luke Kilroy, PA-C  Callie Goodrich, PA-C  Jesse Cleaver, FNP  Then, Jonathan Berry, MD will plan to see you again in 12 month(s). 

## 2020-07-19 NOTE — Assessment & Plan Note (Signed)
History of hyperlipidemia on high-dose atorvastatin with lipid profile performed 01/27/2020 revealing total cholesterol 123, LDL 51 HDL 54.

## 2020-07-19 NOTE — Progress Notes (Signed)
07/19/2020 Jerry [name]   04/02/1951  [id number]  Primary Physician Aletha Halim., PA-C Primary Cardiologist: Lorretta Harp MD FACP, [place], [place], [place]  HPI:  Jerry [name] is a 69 y.o.  moderately overweight widowed Caucasian male father of 16, grandfather and 2 grandchildren who has been disabled since 1999.I last saw him in the office 01/27/2020. He is referred by Leonia Reader, PA-C were revascularized evaluation and clearance for his upcoming left total knee replacement. He had seen Dr. Mare Ferrari in the past. He has a history discontinued tobacco abuse, treated hypertension and hyperlipidemia. He apparently had a circumflex obtuse marginal branch stent back in 1997.  He was admitted to [facility] on 07/15/2018 with increasing dyspnea on exertion and lower extreme edema. He was diuresed. 2D echo revealed a moderate decline in LV function from 55 to 60% a year ago down to 40 to 45%. He did undergo a right left heart cath by Dr.Endrevealing a patent AV groove circumflex stent, severe in-stent restenosis within the first obtuse marginal branch stent and moderate mid LAD disease which was stented. He feels clinically improved.  He is in a wheelchair because he says his left leg is unstable. He sees Dr. Melvyn Novas for his pulmonary issues. He denies chest pain.  He  did have a 2D echo which was unremarkable with normal LV function and a event monitor that showed sinus rhythm with PVCs and occasional runs of nonsustained ventricular tachycardia.  He is on low-dose beta-blocker which we will adjust.  Is also had several episodes of nitro responsive chest pain.  Since I saw him 6 months ago he has developed a UTI and apparently has gained 20 pounds over the last several days.  He does not have an appointment to see his PCP until a week from now.  He does have a diffuse edema.  He gets occasional nonanginal chest pain.   No outpatient medications have been marked  as taking for the 07/19/20 encounter (Office Visit) with Lorretta Harp, MD.     Allergies  Allergen Reactions   Ciprofloxacin Anaphylaxis   Penicillins Rash and Other (See Comments)    UNSPECIFIED SEVERITY Has patient had a PCN reaction causing immediate rash, facial/tongue/throat swelling, SOB or lightheadedness with hypotension:unsure Has patient had a PCN reaction causing severe rash involving mucus membranes or skin necrosis:unsure Has patient had a PCN reaction that required hospitalization:unsure Has patient had a PCN reaction occurring within the last 10 years:NO    Other Other (See Comments)   Penicillin G Other (See Comments)   Tramadol Other (See Comments)   Tramadol Hcl Rash    Social History   Socioeconomic History   Marital status: Widowed    Spouse name: Not on file   Number of children: Not on file   Years of education: Not on file   Highest education level: Not on file  Occupational History   Occupation: Disabled   Tobacco Use   Smoking status: Former Smoker    Packs/day: 0.25    Years: 33.00    Pack years: 8.25    Types: Cigarettes    Quit date: 05/05/2015    Years since quitting: 5.2   Smokeless tobacco: Never Used  Vaping Use   Vaping Use: Never used  Substance and Sexual Activity   Alcohol use: No    Alcohol/week: 0.0 standard drinks   Drug use: No   Sexual activity: Never  Other Topics Concern   Not on  file  Social History Narrative   Not on file   Social Determinants of Health   Financial Resource Strain: Not on file  Food Insecurity: Not on file  Transportation Needs: Not on file  Physical Activity: Not on file  Stress: Not on file  Social Connections: Not on file  Intimate Partner Violence: Not on file     Review of Systems: General: negative for chills, fever, night sweats or weight changes.  Cardiovascular: negative for chest pain, dyspnea on exertion, edema, orthopnea, palpitations, paroxysmal nocturnal  dyspnea or shortness of breath Dermatological: negative for rash Respiratory: negative for cough or wheezing Urologic: negative for hematuria Abdominal: negative for nausea, vomiting, diarrhea, bright red blood per rectum, melena, or hematemesis Neurologic: negative for visual changes, syncope, or dizziness All other systems reviewed and are otherwise negative except as noted above.    Blood pressure 130/64, pulse 73, height 6\' 2"  (1.88 m), weight (!) 318 lb (144.2 kg), SpO2 98 %.  General appearance: alert and no distress Neck: no adenopathy, no carotid bruit, no JVD, supple, symmetrical, trachea midline and thyroid not enlarged, symmetric, no tenderness/mass/nodules Lungs: clear to auscultation bilaterally Heart: regular rate and rhythm, S1, S2 normal, no murmur, click, rub or gallop Extremities: 1-2+ pitting edema bilaterally office UTI for last 3 to 4 days and he has not obtained any skin 20 pounds and he said the culture office and the soonest exam is a week from now in the ER and admitted if his epistaxis no particulars okay for all other medical male's Pulses: 2+ and symmetric Skin: Skin color, texture, turgor normal. No rashes or lesions Neurologic: Alert and oriented X 3, normal strength and tone. Normal symmetric reflexes. Normal coordination and gait  EKG sinus rhythm at 73 without ST or T wave changes.  Personally reviewed this EKG.  ASSESSMENT AND PLAN:   CAD (coronary artery disease) History of CAD status post cardiac catheterization by Dr. Saunders Revel 07/15/2018 revealing a patent AV groove circumflex stent, severe in-stent restenosis within the first obtuse marginal branch stent and moderate mid LAD disease which was stented.  He felt clinically improved after this.  Edema 1-2+ pitting edema probably related to diastolic heart failure and recent UTI.  He is on a diuretic.  Cigarette smoker Remote tobacco abuse having quit 6 years ago  Essential hypertension History of  essential hypertension a blood pressure measured today 130/64.  He is on losartan and metoprolol.  HLD (hyperlipidemia) History of hyperlipidemia on high-dose atorvastatin with lipid profile performed 01/27/2020 revealing total cholesterol 123, LDL 51 HDL 54.  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  He is on oral diuretic.      Lorretta Harp MD FACP,FACC,FAHA, [facility] 07/19/2020 2:34 PM

## 2020-07-19 NOTE — Assessment & Plan Note (Signed)
History of CAD status post cardiac catheterization by Dr. Saunders Revel 07/15/2018 revealing a patent AV groove circumflex stent, severe in-stent restenosis within the first obtuse marginal branch stent and moderate mid LAD disease which was stented.  He felt clinically improved after this.

## 2020-07-22 ENCOUNTER — Ambulatory Visit: Payer: Medicare HMO | Admitting: Cardiovascular Disease

## 2020-08-08 ENCOUNTER — Ambulatory Visit: Payer: Medicare HMO | Admitting: Internal Medicine

## 2020-08-09 ENCOUNTER — Other Ambulatory Visit: Payer: Self-pay | Admitting: Cardiovascular Disease

## 2020-09-05 ENCOUNTER — Ambulatory Visit: Payer: Medicare HMO | Admitting: Internal Medicine
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
## 2020-09-23 ENCOUNTER — Encounter: Payer: Self-pay | Admitting: Internal Medicine

## 2020-09-23 ENCOUNTER — Other Ambulatory Visit: Payer: Self-pay

## 2020-09-23 ENCOUNTER — Ambulatory Visit: Payer: Medicare HMO | Admitting: Internal Medicine

## 2020-09-23 DIAGNOSIS — J9611 Chronic respiratory failure with hypoxia: Secondary | ICD-10-CM

## 2020-09-23 DIAGNOSIS — J449 Chronic obstructive pulmonary disease, unspecified: Secondary | ICD-10-CM | POA: Diagnosis not present

## 2020-09-23 DIAGNOSIS — J9612 Chronic respiratory failure with hypercapnia: Secondary | ICD-10-CM

## 2020-09-23 MED ORDER — BREZTRI AEROSPHERE 160-9-4.8 MCG/ACT IN AERO: 2.0000 | INHALATION_SPRAY | Freq: Two times a day (BID) | RESPIRATORY_TRACT | 0 refills | Status: DC

## 2020-09-23 NOTE — Assessment & Plan Note (Signed)
Quit smoking 2016 - PFT's  07/29/2019  FEV1 1.80 (54 % ) ratio 0.67  p 3 % improvement from saba p 0 prior to study with DLCO  17.61 (67%) corrects to 4.68 (114%)  for alv volume and FV curve classic curvature   -07/29/2019  try bevespi   - 01/28/2020  After extensive coaching inhaler device,  effectiveness =    75% > continue breztri    Group D in terms of symptom/risk and laba/lama/ICS  therefore appropriate rx at this point >>>  Continue breztri and judicious saba.  I spent extra time with pt today reviewing appropriate use of albuterol for prn use on exertion with the following points: 1) saba is for relief of sob that does not improve by walking a slower pace or resting but rather if the pt does not improve after trying this first. 2) If the pt is convinced, as many are, that saba helps recover from activity faster then it's easy to tell if this is the case by re-challenging : ie stop, take the inhaler, then p 5 minutes try the exact same activity (intensity of workload) that just caused the symptoms and see if they are substantially diminished or not after saba 3) if there is an activity that reproducibly causes the symptoms, try the saba 15 min before the activity on alternate days   If in fact the saba really does help, then fine to continue to use it prn but advised may need to look closer at the maintenance regimen being used to achieve better control of airways disease with exertion.
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
## 2020-09-23 NOTE — Patient Instructions (Addendum)
Make sure you check your oxygen saturation  at your highest level of activity  to be sure it stays over 90% and adjust  02 flow upward to maintain this level if needed but remember to turn it back to previous settings when you stop (to conserve your supply).   Try albuterol 15 min before an activity that you know would make you short of breath and see if it makes any difference and if makes none then don't take it after activity unless you can't catch your breath.  Please schedule a follow up visit in  6 months but call sooner if needed

## 2020-09-23 NOTE — Assessment & Plan Note (Addendum)
02 rx 2lpm  - ONO RA 08/16/19  desat < 88% x 39 min , 40 sec or 13.77% of study so rec 2lpm hs  - 09/08/2019- deat <88%RA x1.5 Laps (approx 350 ft) stopped d/t fatigue. Needs 2L day time oxygen; continue 2L nocturnal oxygen - 11/19/2019 after 1/2 lap o2 sat decreased to 88%ra--placed pt on POC and sats increased to 95 on 2lpm pulsed o2--walked rest of the lap and sats at end were 94% 2lpm//pt walked average pace with walker/stopped multiple times to rest due to legs weak   - HC03  06/09/20   = 33   09/23/2020   Walked on RA x 100 ft desats then  X completed one lap =  approx 250 ft -@ slow pace on 2lpm continuous stopped due to sob with sats still 92%    Adequate control on present rx, reviewed in detail with pt > no change in rx needed  = 2lpm hs and with activity, none needed at rest.  F/u q 6  Sooner prn   Each maintenance medication was reviewed in detail including emphasizing most importantly the difference between maintenance and prns and under what circumstances the prns are to be triggered using an action plan format where appropriate.  Total time for H and P, chart review, counseling,  directly observing portions of ambulatory 02 saturation study/ and generating customized AVS unique to this office visit / same day charting  > 30 min

## 2020-09-26 ENCOUNTER — Encounter (HOSPITAL_COMMUNITY): Payer: Self-pay | Admitting: Emergency Medicine

## 2020-09-26 ENCOUNTER — Inpatient Hospital Stay (HOSPITAL_COMMUNITY)
Admission: EM | Admit: 2020-09-26 | Discharge: 2020-10-02 | DRG: 286 | Disposition: A | Discharge status: Home Health | Payer: Medicare HMO | Attending: Internal Medicine | Admitting: Internal Medicine

## 2020-09-26 ENCOUNTER — Emergency Department (HOSPITAL_COMMUNITY): Payer: Medicare HMO

## 2020-09-26 DIAGNOSIS — I5032 Chronic diastolic (congestive) heart failure: Secondary | ICD-10-CM | POA: Diagnosis present

## 2020-09-26 DIAGNOSIS — I493 Ventricular premature depolarization: Secondary | ICD-10-CM | POA: Diagnosis present

## 2020-09-26 DIAGNOSIS — J9809 Other diseases of bronchus, not elsewhere classified: Secondary | ICD-10-CM | POA: Diagnosis present

## 2020-09-26 DIAGNOSIS — F32A Depression, unspecified: Secondary | ICD-10-CM | POA: Diagnosis present

## 2020-09-26 DIAGNOSIS — I272 Pulmonary hypertension, unspecified: Secondary | ICD-10-CM | POA: Diagnosis present

## 2020-09-26 DIAGNOSIS — Z886 Allergy status to analgesic agent status: Secondary | ICD-10-CM

## 2020-09-26 DIAGNOSIS — Z20822 Contact with and (suspected) exposure to covid-19: Secondary | ICD-10-CM | POA: Diagnosis present

## 2020-09-26 DIAGNOSIS — Z87891 Personal history of nicotine dependence: Secondary | ICD-10-CM

## 2020-09-26 DIAGNOSIS — I2510 Atherosclerotic heart disease of native coronary artery without angina pectoris: Secondary | ICD-10-CM | POA: Diagnosis present

## 2020-09-26 DIAGNOSIS — G4733 Obstructive sleep apnea (adult) (pediatric): Secondary | ICD-10-CM | POA: Diagnosis present

## 2020-09-26 DIAGNOSIS — Z955 Presence of coronary angioplasty implant and graft: Secondary | ICD-10-CM

## 2020-09-26 DIAGNOSIS — Z981 Arthrodesis status: Secondary | ICD-10-CM

## 2020-09-26 DIAGNOSIS — I10 Essential (primary) hypertension: Secondary | ICD-10-CM | POA: Diagnosis present

## 2020-09-26 DIAGNOSIS — F419 Anxiety disorder, unspecified: Secondary | ICD-10-CM | POA: Diagnosis present

## 2020-09-26 DIAGNOSIS — I421 Obstructive hypertrophic cardiomyopathy: Secondary | ICD-10-CM | POA: Diagnosis present

## 2020-09-26 DIAGNOSIS — Z825 Family history of asthma and other chronic lower respiratory diseases: Secondary | ICD-10-CM

## 2020-09-26 DIAGNOSIS — Z881 Allergy status to other antibiotic agents status: Secondary | ICD-10-CM

## 2020-09-26 DIAGNOSIS — Z7902 Long term (current) use of antithrombotics/antiplatelets: Secondary | ICD-10-CM

## 2020-09-26 DIAGNOSIS — I5033 Acute on chronic diastolic (congestive) heart failure: Secondary | ICD-10-CM | POA: Diagnosis present

## 2020-09-26 DIAGNOSIS — Z79899 Other long term (current) drug therapy: Secondary | ICD-10-CM

## 2020-09-26 DIAGNOSIS — I110 Hypertensive heart disease with heart failure: Principal | ICD-10-CM | POA: Diagnosis present

## 2020-09-26 DIAGNOSIS — D649 Anemia, unspecified: Secondary | ICD-10-CM | POA: Diagnosis present

## 2020-09-26 DIAGNOSIS — E785 Hyperlipidemia, unspecified: Secondary | ICD-10-CM | POA: Diagnosis present

## 2020-09-26 DIAGNOSIS — I739 Peripheral vascular disease, unspecified: Secondary | ICD-10-CM | POA: Diagnosis present

## 2020-09-26 DIAGNOSIS — Z96651 Presence of right artificial knee joint: Secondary | ICD-10-CM | POA: Diagnosis present

## 2020-09-26 DIAGNOSIS — Z8249 Family history of ischemic heart disease and other diseases of the circulatory system: Secondary | ICD-10-CM

## 2020-09-26 DIAGNOSIS — J9621 Acute and chronic respiratory failure with hypoxia: Secondary | ICD-10-CM | POA: Diagnosis present

## 2020-09-26 DIAGNOSIS — E669 Obesity, unspecified: Secondary | ICD-10-CM

## 2020-09-26 DIAGNOSIS — J9612 Chronic respiratory failure with hypercapnia: Secondary | ICD-10-CM | POA: Diagnosis present

## 2020-09-26 DIAGNOSIS — K219 Gastro-esophageal reflux disease without esophagitis: Secondary | ICD-10-CM | POA: Diagnosis present

## 2020-09-26 DIAGNOSIS — E876 Hypokalemia: Secondary | ICD-10-CM | POA: Diagnosis present

## 2020-09-26 DIAGNOSIS — Z6839 Body mass index (BMI) 39.0-39.9, adult: Secondary | ICD-10-CM

## 2020-09-26 DIAGNOSIS — Z85830 Personal history of malignant neoplasm of bone: Secondary | ICD-10-CM

## 2020-09-26 DIAGNOSIS — R0600 Dyspnea, unspecified: Secondary | ICD-10-CM

## 2020-09-26 DIAGNOSIS — J449 Chronic obstructive pulmonary disease, unspecified: Secondary | ICD-10-CM | POA: Diagnosis present

## 2020-09-26 DIAGNOSIS — Z880 Allergy status to penicillin: Secondary | ICD-10-CM

## 2020-09-26 DIAGNOSIS — Z7982 Long term (current) use of aspirin: Secondary | ICD-10-CM

## 2020-09-26 DIAGNOSIS — J9611 Chronic respiratory failure with hypoxia: Secondary | ICD-10-CM | POA: Diagnosis present

## 2020-09-26 DIAGNOSIS — R0609 Other forms of dyspnea: Secondary | ICD-10-CM

## 2020-09-26 DIAGNOSIS — R002 Palpitations: Secondary | ICD-10-CM

## 2020-09-26 DIAGNOSIS — G894 Chronic pain syndrome: Secondary | ICD-10-CM | POA: Diagnosis present

## 2020-09-26 DIAGNOSIS — R079 Chest pain, unspecified: Secondary | ICD-10-CM | POA: Diagnosis present

## 2020-09-26 DIAGNOSIS — R5381 Other malaise: Secondary | ICD-10-CM | POA: Diagnosis present

## 2020-09-26 DIAGNOSIS — Z888 Allergy status to other drugs, medicaments and biological substances status: Secondary | ICD-10-CM

## 2020-09-26 DIAGNOSIS — J9622 Acute and chronic respiratory failure with hypercapnia: Secondary | ICD-10-CM | POA: Diagnosis present

## 2020-09-26 DIAGNOSIS — R7303 Prediabetes: Secondary | ICD-10-CM | POA: Diagnosis present

## 2020-09-26 DIAGNOSIS — I252 Old myocardial infarction: Secondary | ICD-10-CM

## 2020-09-26 HISTORY — DX: Disorder of arteries and arterioles, unspecified: I77.9

## 2020-09-26 HISTORY — DX: Chronic diastolic (congestive) heart failure: I50.32

## 2020-09-26 HISTORY — DX: Other ventricular tachycardia: I47.29

## 2020-09-26 HISTORY — DX: Ventricular tachycardia: I47.2

## 2020-09-26 HISTORY — DX: Ventricular premature depolarization: I49.3

## 2020-09-26 HISTORY — DX: Solitary pulmonary nodule: R91.1

## 2020-09-26 LAB — BASIC METABOLIC PANEL
Anion gap: 8 (ref 5–15)
BUN: 9 mg/dL (ref 8–23)
CO2: 36 mmol/L — ABNORMAL HIGH (ref 22–32)
Calcium: 9.5 mg/dL (ref 8.9–10.3)
Chloride: 94 mmol/L — ABNORMAL LOW (ref 98–111)
Creatinine, Ser: 0.97 mg/dL (ref 0.61–1.24)
GFR, Estimated: >60 mL/min (ref >60)
Glucose, Bld: 123 mg/dL — ABNORMAL HIGH (ref 70–99)
Potassium: 4.1 mmol/L (ref 3.5–5.1)
Sodium: 138 mmol/L (ref 135–145)

## 2020-09-26 LAB — CBC
HCT: 35.7 % — ABNORMAL LOW (ref 39.0–52.0)
Hemoglobin: 11.1 g/dL — ABNORMAL LOW (ref 13.0–17.0)
MCH: 28.1 pg (ref 26.0–34.0)
MCHC: 31.1 g/dL (ref 30.0–36.0)
MCV: 90.4 fL (ref 80.0–100.0)
Platelets: 257 10*3/uL (ref 150–400)
RBC: 3.95 MIL/uL — ABNORMAL LOW (ref 4.22–5.81)
RDW: 14.4 % (ref 11.5–15.5)
WBC: 7.2 10*3/uL (ref 4.0–10.5)
nRBC: 0 % (ref 0.0–0.2)

## 2020-09-26 LAB — TROPONIN I (HIGH SENSITIVITY)
Troponin I (High Sensitivity): 7 ng/L (ref <18)
Troponin I (High Sensitivity): 7 ng/L (ref <18)

## 2020-09-26 LAB — RESP PANEL BY RT-PCR (FLU A&B, COVID) ARPGX2
Influenza A by PCR: NEGATIVE
Influenza B by PCR: NEGATIVE
SARS Coronavirus 2 by RT PCR: NEGATIVE

## 2020-09-26 LAB — D-DIMER, QUANTITATIVE: D-Dimer, Quant: 0.33 ug/mL-FEU (ref 0.00–0.50)

## 2020-09-26 LAB — BRAIN NATRIURETIC PEPTIDE: B Natriuretic Peptide: 53.2 pg/mL (ref 0.0–100.0)

## 2020-09-26 MED ORDER — ONDANSETRON HCL 4 MG/2ML IJ SOLN
4.0000 mg | Freq: Four times a day (QID) | INTRAMUSCULAR | Status: DC | PRN
  Administered 2020-09-27: 13:00:00 4 mg via INTRAVENOUS
  Filled 2020-09-26: qty 2

## 2020-09-26 MED ORDER — ENOXAPARIN SODIUM 40 MG/0.4ML ~~LOC~~ SOLN
40.0000 mg | SUBCUTANEOUS | Status: DC
  Administered 2020-09-27 – 2020-09-29 (×3): 40 mg via SUBCUTANEOUS
  Filled 2020-09-26 (×3): qty 0.4

## 2020-09-26 MED ORDER — TIZANIDINE HCL 4 MG PO TABS
4.0000 mg | ORAL_TABLET | Freq: Once | ORAL | Status: AC
  Administered 2020-09-27: 4 mg via ORAL
  Filled 2020-09-26: qty 1

## 2020-09-26 MED ORDER — ENOXAPARIN SODIUM 30 MG/0.3ML ~~LOC~~ SOLN: 30.0000 mg | SUBCUTANEOUS | Status: DC

## 2020-09-26 MED ORDER — ACETAMINOPHEN 325 MG PO TABS: 650.0000 mg | ORAL_TABLET | ORAL | Status: DC | PRN

## 2020-09-26 MED ORDER — HYDROMORPHONE HCL 2 MG PO TABS
8.0000 mg | ORAL_TABLET | Freq: Once | ORAL | Status: AC
  Administered 2020-09-27: 8 mg via ORAL
  Filled 2020-09-26: qty 4

## 2020-09-26 MED ORDER — FUROSEMIDE 10 MG/ML IJ SOLN
40.0000 mg | Freq: Once | INTRAMUSCULAR | Status: AC
  Administered 2020-09-26: 40 mg via INTRAVENOUS
  Filled 2020-09-26: qty 4

## 2020-09-26 NOTE — ED Notes (Signed)
ED Provider at bedside. 

## 2020-09-26 NOTE — ED Triage Notes (Signed)
Pt arrives to ED via [facility] from home with chief complaint of chest pain. Hx of MI with multiple stents. Pt has had 324 asa and 1 sl nitro
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
## 2020-09-27 ENCOUNTER — Other Ambulatory Visit: Payer: Self-pay

## 2020-09-27 ENCOUNTER — Encounter (HOSPITAL_COMMUNITY): Payer: Self-pay | Admitting: Internal Medicine

## 2020-09-27 ENCOUNTER — Observation Stay (HOSPITAL_BASED_OUTPATIENT_CLINIC_OR_DEPARTMENT_OTHER): Payer: Medicare HMO

## 2020-09-27 DIAGNOSIS — I5033 Acute on chronic diastolic (congestive) heart failure: Secondary | ICD-10-CM | POA: Diagnosis not present

## 2020-09-27 DIAGNOSIS — I2510 Atherosclerotic heart disease of native coronary artery without angina pectoris: Secondary | ICD-10-CM

## 2020-09-27 DIAGNOSIS — I10 Essential (primary) hypertension: Secondary | ICD-10-CM

## 2020-09-27 DIAGNOSIS — D649 Anemia, unspecified: Secondary | ICD-10-CM

## 2020-09-27 DIAGNOSIS — E669 Obesity, unspecified: Secondary | ICD-10-CM

## 2020-09-27 DIAGNOSIS — R079 Chest pain, unspecified: Secondary | ICD-10-CM | POA: Diagnosis not present

## 2020-09-27 DIAGNOSIS — E782 Mixed hyperlipidemia: Secondary | ICD-10-CM

## 2020-09-27 DIAGNOSIS — R7303 Prediabetes: Secondary | ICD-10-CM

## 2020-09-27 DIAGNOSIS — K219 Gastro-esophageal reflux disease without esophagitis: Secondary | ICD-10-CM

## 2020-09-27 DIAGNOSIS — F419 Anxiety disorder, unspecified: Secondary | ICD-10-CM

## 2020-09-27 LAB — HEMOGLOBIN A1C
Hgb A1c MFr Bld: 6 % — ABNORMAL HIGH (ref 4.8–5.6)
Mean Plasma Glucose: 125.5 mg/dL

## 2020-09-27 LAB — GLUCOSE, CAPILLARY: Glucose-Capillary: 135 mg/dL — ABNORMAL HIGH (ref 70–99)

## 2020-09-27 LAB — MRSA PCR SCREENING: MRSA by PCR: NEGATIVE

## 2020-09-27 LAB — CBG MONITORING, ED
Glucose-Capillary: 120 mg/dL — ABNORMAL HIGH (ref 70–99)
Glucose-Capillary: 148 mg/dL — ABNORMAL HIGH (ref 70–99)

## 2020-09-27 LAB — HIV ANTIBODY (ROUTINE TESTING W REFLEX): HIV Screen 4th Generation wRfx: NONREACTIVE

## 2020-09-27 LAB — ECHOCARDIOGRAM COMPLETE
Area-P 1/2: 3.03 cm2
S' Lateral: 5.1 cm

## 2020-09-27 MED ORDER — FERROUS SULFATE 325 (65 FE) MG PO TABS
325.0000 mg | ORAL_TABLET | Freq: Every day | ORAL | Status: DC
  Administered 2020-09-27 – 2020-10-02 (×6): 325 mg via ORAL
  Filled 2020-09-27 (×6): qty 1

## 2020-09-27 MED ORDER — SERTRALINE HCL 25 MG PO TABS
50.0000 mg | ORAL_TABLET | Freq: Every day | ORAL | Status: DC
Start: 2020-09-27 — End: 2020-10-02
  Administered 2020-09-27 – 2020-10-02 (×6): 50 mg via ORAL
  Filled 2020-09-27: qty 2
  Filled 2020-09-27: qty 1
  Filled 2020-09-27 (×4): qty 2

## 2020-09-27 MED ORDER — CALCIUM CARBONATE ANTACID 500 MG PO CHEW: 1.0000 | CHEWABLE_TABLET | Freq: Three times a day (TID) | ORAL | Status: DC | PRN

## 2020-09-27 MED ORDER — SENNA 8.6 MG PO TABS
2.0000 | ORAL_TABLET | Freq: Two times a day (BID) | ORAL | Status: DC
  Administered 2020-09-27 – 2020-10-02 (×11): 17.2 mg via ORAL
  Filled 2020-09-27 (×11): qty 2

## 2020-09-27 MED ORDER — ESCITALOPRAM OXALATE 10 MG PO TABS
20.0000 mg | ORAL_TABLET | Freq: Every day | ORAL | Status: DC
  Administered 2020-09-27 – 2020-10-02 (×6): 20 mg via ORAL
  Filled 2020-09-27 (×6): qty 2

## 2020-09-27 MED ORDER — BISACODYL 10 MG RE SUPP
10.0000 mg | Freq: Every day | RECTAL | Status: DC | PRN
  Administered 2020-09-27 – 2020-10-01 (×2): 10 mg via RECTAL
  Filled 2020-09-27 (×2): qty 1

## 2020-09-27 MED ORDER — TRAZODONE HCL 50 MG PO TABS
300.0000 mg | ORAL_TABLET | Freq: Every day | ORAL | Status: DC
  Administered 2020-09-27 – 2020-10-01 (×5): 300 mg via ORAL
  Filled 2020-09-27 (×5): qty 6

## 2020-09-27 MED ORDER — DUTASTERIDE 0.5 MG PO CAPS
0.5000 mg | ORAL_CAPSULE | Freq: Every day | ORAL | Status: DC
  Administered 2020-09-27 – 2020-10-01 (×5): 0.5 mg via ORAL
  Filled 2020-09-27 (×7): qty 1

## 2020-09-27 MED ORDER — PANTOPRAZOLE SODIUM 40 MG PO TBEC
40.0000 mg | DELAYED_RELEASE_TABLET | Freq: Two times a day (BID) | ORAL | Status: DC
  Administered 2020-09-27 – 2020-10-02 (×11): 40 mg via ORAL
  Filled 2020-09-27 (×11): qty 1

## 2020-09-27 MED ORDER — POTASSIUM CHLORIDE CRYS ER 20 MEQ PO TBCR: 20.0000 meq | EXTENDED_RELEASE_TABLET | Freq: Every day | ORAL | Status: DC

## 2020-09-27 MED ORDER — LOSARTAN POTASSIUM 25 MG PO TABS
25.0000 mg | ORAL_TABLET | Freq: Every day | ORAL | Status: DC
  Administered 2020-09-27 – 2020-10-02 (×6): 25 mg via ORAL
  Filled 2020-09-27 (×6): qty 1

## 2020-09-27 MED ORDER — HYDROMORPHONE HCL 2 MG PO TABS
8.0000 mg | ORAL_TABLET | Freq: Four times a day (QID) | ORAL | Status: DC | PRN
  Administered 2020-09-27 – 2020-10-02 (×19): 8 mg via ORAL
  Filled 2020-09-27 (×19): qty 4

## 2020-09-27 MED ORDER — BUDESON-GLYCOPYRROL-FORMOTEROL 160-9-4.8 MCG/ACT IN AERO: 2.0000 | INHALATION_SPRAY | Freq: Two times a day (BID) | RESPIRATORY_TRACT | Status: DC

## 2020-09-27 MED ORDER — ATORVASTATIN CALCIUM 80 MG PO TABS
80.0000 mg | ORAL_TABLET | Freq: Every day | ORAL | Status: DC
  Administered 2020-09-27 – 2020-10-01 (×5): 80 mg via ORAL
  Filled 2020-09-27 (×5): qty 1

## 2020-09-27 MED ORDER — NITROGLYCERIN 0.4 MG SL SUBL
0.4000 mg | SUBLINGUAL_TABLET | SUBLINGUAL | Status: DC | PRN
  Administered 2020-09-27: 0.4 mg via SUBLINGUAL
  Filled 2020-09-27: qty 1

## 2020-09-27 MED ORDER — POLYETHYLENE GLYCOL 3350 17 G PO PACK
17.0000 g | PACK | Freq: Every evening | ORAL | Status: DC
  Administered 2020-09-27 – 2020-10-01 (×5): 17 g via ORAL
  Filled 2020-09-27 (×5): qty 1

## 2020-09-27 MED ORDER — FUROSEMIDE 10 MG/ML IJ SOLN
40.0000 mg | Freq: Two times a day (BID) | INTRAMUSCULAR | Status: DC
  Administered 2020-09-27 – 2020-09-30 (×8): 40 mg via INTRAVENOUS
  Filled 2020-09-27 (×8): qty 4

## 2020-09-27 MED ORDER — ASPIRIN EC 81 MG PO TBEC
81.0000 mg | DELAYED_RELEASE_TABLET | Freq: Every day | ORAL | Status: DC
  Administered 2020-09-27 – 2020-09-29 (×3): 81 mg via ORAL
  Filled 2020-09-27 (×3): qty 1

## 2020-09-27 MED ORDER — UMECLIDINIUM BROMIDE 62.5 MCG/INH IN AEPB
1.0000 | INHALATION_SPRAY | Freq: Every day | RESPIRATORY_TRACT | Status: DC
  Administered 2020-09-28 – 2020-10-02 (×5): 1 via RESPIRATORY_TRACT
  Filled 2020-09-27: qty 7

## 2020-09-27 MED ORDER — POTASSIUM CHLORIDE CRYS ER 20 MEQ PO TBCR
20.0000 meq | EXTENDED_RELEASE_TABLET | Freq: Every day | ORAL | Status: DC
  Administered 2020-09-27: 20 meq via ORAL
  Filled 2020-09-27: qty 1

## 2020-09-27 MED ORDER — PERFLUTREN LIPID MICROSPHERE
1.0000 mL | INTRAVENOUS | Status: AC | PRN
  Administered 2020-09-27: 3 mL via INTRAVENOUS
  Filled 2020-09-27: qty 10

## 2020-09-27 MED ORDER — CLOPIDOGREL BISULFATE 75 MG PO TABS
75.0000 mg | ORAL_TABLET | Freq: Every day | ORAL | Status: DC
Start: 2020-09-27 — End: 2020-09-29
  Administered 2020-09-27 – 2020-09-29 (×3): 75 mg via ORAL
  Filled 2020-09-27 (×3): qty 1

## 2020-09-27 MED ORDER — METOPROLOL TARTRATE 25 MG PO TABS
25.0000 mg | ORAL_TABLET | Freq: Two times a day (BID) | ORAL | Status: DC
  Administered 2020-09-27 – 2020-10-02 (×11): 25 mg via ORAL
  Filled 2020-09-27 (×11): qty 1

## 2020-09-27 MED ORDER — FLUTICASONE FUROATE-VILANTEROL 100-25 MCG/INH IN AEPB
1.0000 | INHALATION_SPRAY | Freq: Every day | RESPIRATORY_TRACT | Status: DC
  Administered 2020-09-27 – 2020-10-02 (×6): 1 via RESPIRATORY_TRACT
  Filled 2020-09-27: qty 28

## 2020-09-27 MED ORDER — ALBUTEROL SULFATE HFA 108 (90 BASE) MCG/ACT IN AERS
2.0000 | INHALATION_SPRAY | RESPIRATORY_TRACT | Status: DC | PRN
  Filled 2020-09-27: qty 6.7

## 2020-09-27 MED ORDER — TIZANIDINE HCL 4 MG PO TABS
4.0000 mg | ORAL_TABLET | Freq: Four times a day (QID) | ORAL | Status: DC | PRN
  Administered 2020-09-27 – 2020-10-02 (×17): 4 mg via ORAL
  Filled 2020-09-27 (×17): qty 1

## 2020-09-27 MED ORDER — TAMSULOSIN HCL 0.4 MG PO CAPS
0.8000 mg | ORAL_CAPSULE | Freq: Every day | ORAL | Status: DC
  Administered 2020-09-27 – 2020-10-02 (×6): 0.8 mg via ORAL
  Filled 2020-09-27 (×6): qty 2

## 2020-09-27 MED ORDER — VITAMIN D 25 MCG (1000 UNIT) PO TABS
1000.0000 [IU] | ORAL_TABLET | Freq: Every day | ORAL | Status: DC
  Administered 2020-09-27 – 2020-10-02 (×6): 1000 [IU] via ORAL
  Filled 2020-09-27 (×6): qty 1

## 2020-09-27 MED ORDER — NORTRIPTYLINE HCL 10 MG PO CAPS
40.0000 mg | ORAL_CAPSULE | Freq: Every day | ORAL | Status: DC
  Administered 2020-09-27 – 2020-10-01 (×5): 40 mg via ORAL
  Filled 2020-09-27 (×6): qty 4

## 2020-09-27 NOTE — Progress Notes (Signed)
  Echocardiogram 2D Echocardiogram has been performed.  Jerry [name] 09/27/2020, 10:38 AM

## 2020-09-27 NOTE — ED Notes (Signed)
Lunch Tray Ordered @ 1006.  

## 2020-09-27 NOTE — ED Notes (Signed)
Pt went to the bathroom and back. Pt reported feeling sick while in the bathroom. Pt placed back on monitor. He remains on NSR in the 80s. Oxygen saturation 98% on 3L.

## 2020-09-27 NOTE — ED Notes (Signed)
Patient said he is feeling better and not needing another nitro at this time. Pt is now resting comfortably.

## 2020-09-27 NOTE — Progress Notes (Signed)
Same day note  Patient seen and examined at bedside.  Patient was admitted to the hospital for chest pain.  At the time of my evaluation, patient complains of nausea, diaphoresis, dizziness lightheadedness, difficulty with ambulation. Patient states that he is experiencing palpitations  on ambulation. He does not feel like ambulating due to his multiple symptoms.  Physical examination reveals male anxious, obese, decreased breath sounds bilaterally.  Vitals with BMI 09/27/2020 09/27/2020 09/27/2020  Height - - -  Weight - - -  BMI - - -  Systolic 170 017 494  Diastolic 87 81 83  Pulse 92 84 85    Laboratory data and imaging was reviewed  Assessment and Plan.  Chest pain Patient has been admitted to telemetry bed. Continue to monitor. EKG showing PVCs. We will continue to supplement oxygen monitor electrolytes. Continue aspirin metoprolol. Check 2D echocardiogram pending, troponin  been negative.    CAD (coronary artery disease) on aspirin, atorvastatin and metoprolol.    Essential hypertension Continue metoprolol, losartan.  Hyperlipidemia Continue atorvastatin     GERD Continue Protonix    Anxiety On Lexapro, trazodone   Chronic diastolic HF (heart failure) due to hypertrophic obstructive cardiomyopathy (HCC) On losartan. Currently compensated.     COPD GOLD II with chronic hypoxic and hyper Respiratory failure  Continue 2 L of oxygen by nasal cannula. Continue bronchodilators.    Class 3 obesity Would benefit from weight loss as outpatient.    Normocytic anemia Monitor H&H.   No Charge  Signed,  Delila Pereyra, MD [facility]
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
## 2020-09-27 NOTE — ED Notes (Signed)
Pt is vomiting, "just water" at present--

## 2020-09-27 NOTE — ED Notes (Addendum)
Pt walked in the hallway.  Heart rate went up to 130s tachycardia. Oxygen saturation WAS 98% on 3L and 24 respiration, DOE and took a while for him to catch his breath. BP 140/82. Pt reports not "feeling good, lightheaded and feeling like he is sweating again".  Notified Dr. Corrie Mckusick via secure chat.

## 2020-09-27 NOTE — ED Notes (Signed)
Pt reports feeling better and refusing medication for nausea at this time. Pt is now resting calmly.

## 2020-09-28 ENCOUNTER — Encounter (HOSPITAL_COMMUNITY): Payer: Self-pay | Admitting: Internal Medicine

## 2020-09-28 DIAGNOSIS — Z20822 Contact with and (suspected) exposure to covid-19: Secondary | ICD-10-CM | POA: Diagnosis present

## 2020-09-28 DIAGNOSIS — I25119 Atherosclerotic heart disease of native coronary artery with unspecified angina pectoris: Secondary | ICD-10-CM | POA: Diagnosis not present

## 2020-09-28 DIAGNOSIS — Z880 Allergy status to penicillin: Secondary | ICD-10-CM | POA: Diagnosis not present

## 2020-09-28 DIAGNOSIS — F32A Depression, unspecified: Secondary | ICD-10-CM | POA: Diagnosis present

## 2020-09-28 DIAGNOSIS — R002 Palpitations: Secondary | ICD-10-CM | POA: Diagnosis present

## 2020-09-28 DIAGNOSIS — R079 Chest pain, unspecified: Secondary | ICD-10-CM | POA: Diagnosis not present

## 2020-09-28 DIAGNOSIS — J9809 Other diseases of bronchus, not elsewhere classified: Secondary | ICD-10-CM | POA: Diagnosis present

## 2020-09-28 DIAGNOSIS — Z886 Allergy status to analgesic agent status: Secondary | ICD-10-CM | POA: Diagnosis not present

## 2020-09-28 DIAGNOSIS — E876 Hypokalemia: Secondary | ICD-10-CM | POA: Diagnosis present

## 2020-09-28 DIAGNOSIS — I421 Obstructive hypertrophic cardiomyopathy: Secondary | ICD-10-CM | POA: Diagnosis present

## 2020-09-28 DIAGNOSIS — G4733 Obstructive sleep apnea (adult) (pediatric): Secondary | ICD-10-CM | POA: Diagnosis present

## 2020-09-28 DIAGNOSIS — J9622 Acute and chronic respiratory failure with hypercapnia: Secondary | ICD-10-CM | POA: Diagnosis present

## 2020-09-28 DIAGNOSIS — M79605 Pain in left leg: Secondary | ICD-10-CM | POA: Diagnosis not present

## 2020-09-28 DIAGNOSIS — I5033 Acute on chronic diastolic (congestive) heart failure: Secondary | ICD-10-CM

## 2020-09-28 DIAGNOSIS — M7989 Other specified soft tissue disorders: Secondary | ICD-10-CM | POA: Diagnosis not present

## 2020-09-28 DIAGNOSIS — E669 Obesity, unspecified: Secondary | ICD-10-CM | POA: Diagnosis not present

## 2020-09-28 DIAGNOSIS — J449 Chronic obstructive pulmonary disease, unspecified: Secondary | ICD-10-CM | POA: Diagnosis present

## 2020-09-28 DIAGNOSIS — R7303 Prediabetes: Secondary | ICD-10-CM | POA: Diagnosis present

## 2020-09-28 DIAGNOSIS — I493 Ventricular premature depolarization: Secondary | ICD-10-CM | POA: Diagnosis present

## 2020-09-28 DIAGNOSIS — I110 Hypertensive heart disease with heart failure: Secondary | ICD-10-CM | POA: Diagnosis present

## 2020-09-28 DIAGNOSIS — D649 Anemia, unspecified: Secondary | ICD-10-CM | POA: Diagnosis present

## 2020-09-28 DIAGNOSIS — I5032 Chronic diastolic (congestive) heart failure: Secondary | ICD-10-CM | POA: Diagnosis not present

## 2020-09-28 DIAGNOSIS — J9611 Chronic respiratory failure with hypoxia: Secondary | ICD-10-CM | POA: Diagnosis not present

## 2020-09-28 DIAGNOSIS — Z881 Allergy status to other antibiotic agents status: Secondary | ICD-10-CM | POA: Diagnosis not present

## 2020-09-28 DIAGNOSIS — I272 Pulmonary hypertension, unspecified: Secondary | ICD-10-CM | POA: Diagnosis present

## 2020-09-28 DIAGNOSIS — J9621 Acute and chronic respiratory failure with hypoxia: Secondary | ICD-10-CM | POA: Diagnosis present

## 2020-09-28 DIAGNOSIS — E785 Hyperlipidemia, unspecified: Secondary | ICD-10-CM | POA: Diagnosis present

## 2020-09-28 DIAGNOSIS — I739 Peripheral vascular disease, unspecified: Secondary | ICD-10-CM | POA: Diagnosis present

## 2020-09-28 DIAGNOSIS — J9612 Chronic respiratory failure with hypercapnia: Secondary | ICD-10-CM | POA: Diagnosis not present

## 2020-09-28 DIAGNOSIS — I2510 Atherosclerotic heart disease of native coronary artery without angina pectoris: Secondary | ICD-10-CM | POA: Diagnosis not present

## 2020-09-28 DIAGNOSIS — K219 Gastro-esophageal reflux disease without esophagitis: Secondary | ICD-10-CM | POA: Diagnosis present

## 2020-09-28 DIAGNOSIS — Z96651 Presence of right artificial knee joint: Secondary | ICD-10-CM | POA: Diagnosis present

## 2020-09-28 DIAGNOSIS — F419 Anxiety disorder, unspecified: Secondary | ICD-10-CM | POA: Diagnosis not present

## 2020-09-28 DIAGNOSIS — M79604 Pain in right leg: Secondary | ICD-10-CM | POA: Diagnosis not present

## 2020-09-28 LAB — BASIC METABOLIC PANEL
Anion gap: 12 (ref 5–15)
BUN: 11 mg/dL (ref 8–23)
CO2: 34 mmol/L — ABNORMAL HIGH (ref 22–32)
Calcium: 9 mg/dL (ref 8.9–10.3)
Chloride: 92 mmol/L — ABNORMAL LOW (ref 98–111)
Creatinine, Ser: 0.95 mg/dL (ref 0.61–1.24)
GFR, Estimated: >60 mL/min (ref >60)
Glucose, Bld: 124 mg/dL — ABNORMAL HIGH (ref 70–99)
Potassium: 3.2 mmol/L — ABNORMAL LOW (ref 3.5–5.1)
Sodium: 138 mmol/L (ref 135–145)

## 2020-09-28 LAB — GLUCOSE, CAPILLARY
Glucose-Capillary: 112 mg/dL — ABNORMAL HIGH (ref 70–99)
Glucose-Capillary: 117 mg/dL — ABNORMAL HIGH (ref 70–99)
Glucose-Capillary: 109 mg/dL — ABNORMAL HIGH (ref 70–99)

## 2020-09-28 MED ORDER — ORAL CARE MOUTH RINSE
15.0000 mL | Freq: Two times a day (BID) | OROMUCOSAL | Status: DC
  Administered 2020-09-29 – 2020-10-02 (×5): 15 mL via OROMUCOSAL

## 2020-09-28 MED ORDER — POTASSIUM CHLORIDE CRYS ER 20 MEQ PO TBCR
40.0000 meq | EXTENDED_RELEASE_TABLET | Freq: Two times a day (BID) | ORAL | Status: AC
  Administered 2020-09-28 – 2020-09-29 (×3): 40 meq via ORAL
  Filled 2020-09-28 (×4): qty 2

## 2020-09-28 MED ORDER — POTASSIUM CHLORIDE CRYS ER 20 MEQ PO TBCR
40.0000 meq | EXTENDED_RELEASE_TABLET | Freq: Once | ORAL | Status: AC
  Administered 2020-09-28: 40 meq via ORAL
  Filled 2020-09-28: qty 2

## 2020-09-28 NOTE — Plan of Care (Signed)

## 2020-09-28 NOTE — Plan of Care (Signed)
  Problem: Education: Goal: Knowledge of General Education information will improve Description: Including pain rating scale, medication(s)/side effects and non-pharmacologic comfort measures 09/28/2020 1935 by Thressa Sheller, RN Outcome: Progressing 09/28/2020 1934 by Thressa Sheller, RN Outcome: Progressing   Problem: Health Behavior/Discharge Planning: Goal: Ability to manage health-related needs will improve 09/28/2020 1935 by Thressa Sheller, RN Outcome: Progressing 09/28/2020 1934 by Thressa Sheller, RN Outcome: Progressing   Problem: Clinical Measurements: Goal: Ability to maintain clinical measurements within normal limits will improve 09/28/2020 1935 by Thressa Sheller, RN Outcome: Progressing 09/28/2020 1934 by Thressa Sheller, RN Outcome: Progressing Goal: Will remain free from infection 09/28/2020 1935 by Thressa Sheller, RN Outcome: Progressing 09/28/2020 1934 by Thressa Sheller, RN Outcome: Progressing Goal: Diagnostic test results will improve Outcome: Progressing Goal: Respiratory complications will improve 09/28/2020 1935 by Thressa Sheller, RN Outcome: Progressing 09/28/2020 1934 by Thressa Sheller, RN Outcome: Progressing Goal: Cardiovascular complication will be avoided 09/28/2020 1935 by Thressa Sheller, RN Outcome: Progressing 09/28/2020 1934 by Thressa Sheller, RN Outcome: Progressing   Problem: Activity: Goal: Risk for activity intolerance will decrease 09/28/2020 1935 by Thressa Sheller, RN Outcome: Progressing 09/28/2020 1934 by Thressa Sheller, RN Outcome: Progressing   Problem: Nutrition: Goal: Adequate nutrition will be maintained Outcome: Progressing   Problem: Coping: Goal: Level of anxiety will decrease Outcome: Progressing   Problem: Elimination: Goal: Will not experience complications related to bowel motility Outcome: Progressing Goal: Will not experience complications related to urinary  retention Outcome: Progressing   Problem: Pain Managment: Goal: General experience of comfort will improve Outcome: Progressing   Problem: Safety: Goal: Ability to remain free from injury will improve Outcome: Progressing   Problem: Skin Integrity: Goal: Risk for impaired skin integrity will decrease Outcome: Progressing

## 2020-09-28 NOTE — Consult Note (Addendum)
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 internal cavity size was normal in size. There is mild concentric left ventricular hypertrophy. Left ventricular diastolic parameters are indeterminate. Right Ventricle: The right ventricular size is normal. Right vetricular wall thickness was not well visualized. Right ventricular systolic function is normal. Left Atrium: Left atrial size was mildly dilated. Right Atrium: Right atrial size was normal in size. Pericardium: There is no evidence of pericardial effusion. Presence of pericardial fat pad. Mitral Valve: The mitral valve is normal in structure. No evidence of mitral valve regurgitation. No evidence of mitral valve stenosis. Tricuspid Valve: The tricuspid valve is normal in  structure. Tricuspid valve regurgitation is trivial. No evidence of tricuspid stenosis. Aortic Valve: The aortic valve has an indeterminant number of cusps. There is mild calcification of the aortic valve. There is moderate thickening of the aortic valve. Aortic valve regurgitation is trivial. Mild to moderate aortic valve sclerosis/calcification is present, without any evidence of aortic stenosis. Pulmonic Valve: The pulmonic valve was not well visualized. Pulmonic valve regurgitation is not visualized. Aorta: Aortic dilatation noted. There is borderline dilatation of the ascending aorta, measuring 38 mm. Venous: The inferior vena cava was not well visualized. IAS/Shunts: The atrial septum is grossly normal.  LEFT VENTRICLE PLAX 2D LVIDd:         5.60 cm LVIDs:         5.10 cm LV PW:         1.50 cm LV IVS:        1.40 cm LVOT diam:     2.10 cm LV SV:         61 LV SV Index:   24 LVOT Area:     3.46 cm  RIGHT VENTRICLE  IVC RV Basal diam:  3.50 cm     IVC diam: 2.30 cm RV Mid diam:    2.30 cm RV S prime:     15.90 cm/s TAPSE (M-mode): 2.6 cm LEFT ATRIUM             Index       RIGHT ATRIUM           Index LA diam:        4.20 cm 1.62 cm/m  RA Area:     21.00 cm LA Vol (A2C):   78.4 ml 30.23 ml/m RA Volume:   63.80 ml  24.60 ml/m LA Vol (A4C):   30.4 ml 11.72 ml/m LA Biplane Vol: 52.0 ml 20.05 ml/m  AORTIC VALVE LVOT Vmax:   87.90 cm/s LVOT Vmean:  54.300 cm/s LVOT VTI:    0.177 m  AORTA Ao Root diam: 4.00 cm Ao Asc diam:  3.80 cm MITRAL VALVE MV Area (PHT): 3.03 cm    SHUNTS MV Decel Time: 250 msec    Systemic VTI:  0.18 m MV E velocity: 71.30 cm/s  Systemic Diam: 2.10 cm MV A velocity: 95.60 cm/s MV E/A ratio:  0.75 Buford Dresser MD Electronically signed by Buford Dresser MD Signature Date/Time: 09/27/2020/2:42:43 PM    Final      Assessment and Plan:   1. Acute on chronic hypoxic respiratory failure - suspect multifactorial in setting of low pulmonary reserve with  O2-dependent COPD (?contribution of pleural plaques as well) with superimposed volume excess with diastolic heart failure (morbid obesity, deconditioning and possible OHS as well) - what appears to be happening this admission is the cascade of desaturation with ambulation leading to compensatory sinus tachycardia with resultant demand-related chest pressure, dyspnea, and dizziness - see below regarding cardiac recs - pulmonary optimization per primary team - ? CXR edema vs infection, may consider pulmonary eval as well  2. Acute on chronic diastolic CHF - volume assessment is somewhat challenging due to his body habitus but he appears volume overloaded on exam today - BNP may not be as useful given obesity - he is seeing some clinical improvement with diuresis, would continue today - we'll follow symptoms with diuresis - if he fails to improve, would consider R/LHC this admission although historically notes report some difficulty lying flat due to chronic back pain - he does have history of orthostasis as well so follow carefully with volume removal - dietitian consult to further explore low sodium diet  3. Coronary artery disease s/p prior PCI as above - hsTroponin negative x 2 which is fairly reassuring given the amount of physiologic stress imposed by tachycardia and hypoxia, although clearly having anginal type discomfort when he desaturates - continue ASA, BB, statin - as above, if symptoms persist despite diuresis and pulmonary optimization, would have low threshold for cath - check lipids in AM  4. PVCs - continue BB, relatively infrequent presently (<1% burden on monitor 2021) - primary team managing lytes   Risk Assessment/Risk Scores:     HEAR Score (for undifferentiated chest pain):  HEAR Score: 5  New York Heart Association (NYHA) Functional Class NYHA Class III  For questions or updates, please contact [facility] Please consult [email or web address] for contact info under     Signed, Charlie Pitter, PA-C  09/28/2020 4:41 PM   Attending Note:   The patient was seen and examined.  Agree with assessment and plan as noted above.  Changes made to the above note as needed.  Patient seen and independently examined with  Melina Copa, PA .   We discussed all aspects of the encounter. I agree with the assessment and plan as stated above.   1.   DOE:   likely due to a combination of his COPD and some diastolic CHF .   He still eats a very salty diet - hot dogs, bacon, fast food biscuits, soup., etc.    We discussed this in some detail Will diurese him and see how he does . If he does not improve, we may have to consider cath    2.  CAD :s/p PCI in  the past.   His troponins are negative .  I suspect the current issues are due to CHF but I would have a low threshold to cath him if he does not improve with diuresis.   3.  Morbid obesity :    Needs to lose weight  Calorie reduction , exercise   I have spent a total of 40 minutes with patient reviewing hospital  notes , telemetry, EKGs, labs and examining patient as well as establishing an assessment and plan that was discussed with the patient.  > 50% of time was spent in direct patient care.    Thayer Headings, Brooke Bonito., MD, [facility] 09/28/2020, 5:37 PM [address],  [place] Pager [phone]

## 2020-09-28 NOTE — Progress Notes (Addendum)
PROGRESS NOTE  Jerry [name] YOV:[id number] DOB: 27-Jan-1951 DOA: 09/26/2020 PCP: Aletha Halim., PA-C   LOS: 0 days   Brief narrative:  Jerry [name] is a 70 y.o. male with medical history significant of iron deficiency anemia, anxiety, depression, chronic lower back pain, COPD, constipation, CAD, osteoarthritis with right knee replacement, GERD, history of unspecified headaches, hyperlipidemia, hypertension, obesity, osteosarcoma of the rib, peripheral vascular disease, history of pneumonia, prediabetes, tobacco abuse presented to hospital with multiple symptoms including chest discomfort dyspnea and palpitation.  Patient stated that his heart rate was in the 180s and I started developing lower extremity edema.  Is been complaining of some productive cough.  In the ED patient had stable vitals.  CBC showed no leukocytosis.  D-dimer was within normal range.  BNP was low.  Patient continued to have dyspnea diaphoresis dizziness palpitation and tachycardia on ambulation and was placed in observation.  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  EKG showing PVCs. We will continue to supplement Continue aspirin metoprolol.  Check 2D echocardiogram, troponin negative so far.  GI consultation due to patient's persistent complaints of tachycardia on ambulation, chest pressure on ambulation.  Patient with history of coronary artery disease and multiple risk  factors.  Hypokalemia.  Will replenish aggressively.  Check levels in a.m.  Coronary artery disease. on aspirin, atorvastatin and metoprolol.  Continue for now.  We will get cardiology consultation.  Patient follows up with cardiology as outpatient.    Essential hypertension Continue metoprolol, losartan.  Monitor blood pressure closely.  Hyperlipidemia Continue atorvastatin     GERD Continue Protonix    Anxiety/depression On Lexapro, trazodone   Acute on chronic diastolic heart failure due to hypertrophic obstructive cardiomyopathy /history of CAD. On losartan, IV diuretics..  Patient symptomatic on ambulation with diaphoresis dizziness tachycardia and chest pressure.  Cardiology  has been consulted.    COPD GOLD II with chronic hypoxic and hypercapneic Respiratory failure  Continue 2 L of oxygen by nasal cannula. Continue bronchodilators.    Class 3 obesity Would benefit from weight loss as outpatient.    Normocytic anemia Monitor H&H.  Debility, deconditioning, poor exercise tolerance.  Was really hypoxic on ambulation with pulse ox in the 70s on ambulation.  Required 5 L of oxygen to bring the off oxygen saturation to 80 to 90s.  Patient might need PT OT evaluation and possible rehabilitation.  Nursing staff reported very poor exercise tolerance tachycardia on ambulation.  DVT prophylaxis: enoxaparin (LOVENOX) injection 40 mg Start: 09/27/20 0000   Code Status: Full code  Family Communication: I spoke with the patient's son and updated him about the clinical condition of the patient. Patient lives with his son at home.  Status is: Observation  The patient will require care spanning > 2 midnights and should be moved to inpatient because: IV treatments appropriate due to intensity of illness or inability to take PO, Inpatient level of care appropriate due to severity of illness and Poor exercise tolerance, ambulatory  dysfunction, postural tachycardia will get  cardiology evaluation  Dispo: The patient is from: Home              Anticipated d/c is to: Home with PT/skilled nursing facility placement.  Will get PT evaluation.              Anticipated d/c date is: 1 to 2 days              Patient currently is not medically stable to d/c.   Difficult to place patient No   Consultants:  Cardiology  Procedures:  None  Anti-infectives:  . None  Anti-infectives (From admission, onward)   None      Subjective: Today, patient was seen and examined at bedside.  Nursing staff reported very poor exercise tolerance with hypoxia up to 70% on ambulation.  Requiring 5 L of oxygen.  Patient states that he feels really bad with palpitations on ambulation and chest pressure with walking.  Nursing staff reported diaphoresis as well   Objective: Vitals:   09/28/20 0830 09/28/20 1151  BP:    Pulse:  94  Resp:    Temp: 97.7 F (36.5 C) 99 F (37.2 C)  SpO2:      Intake/Output Summary (Last 24 hours) at 09/28/2020 1340 Last data filed at 09/28/2020 1304 Gross per 24 hour  Intake 360 ml  Output 1100 ml  Net -740 ml   Filed Weights   09/27/20 1515 09/28/20 0356  Weight: 136.1 kg 133.3 kg   Body mass index is 38.77 kg/m.   Physical Exam:  GENERAL: Patient is alert awake and oriented. Not in obvious distress.  On nasal cannula oxygen, appears chronically ill, HENT: No scleral pallor or icterus. Pupils equally reactive to light. Oral mucosa is moist NECK: is supple, no gross swelling noted. CHEST:   Diminished breath sounds bilaterally.  Coarse breath sounds noted. CVS: S1 and S2 heard, no murmur. Regular rate and rhythm.  ABDOMEN: Soft, non-tender, bowel sounds are present. EXTREMITIES: Bilateral lower extremity edema noted CNS: Cranial nerves are intact. No focal motor deficits. SKIN: warm and dry without rashes.  Data Review: I have personally reviewed the following laboratory data and studies,  CBC: Recent Labs  Lab 09/26/20 1743   WBC 7.2  HGB 11.1*  HCT 35.7*  MCV 90.4  PLT 098   Basic Metabolic Panel: Recent Labs  Lab 09/26/20 1743 09/28/20 0039  NA 138 138  K 4.1 3.2*  CL 94* 92*  CO2 36* 34*  GLUCOSE 123* 124*  BUN 9 11  CREATININE 0.97 0.95  CALCIUM 9.5 9.0   Liver Function Tests: No results for input(s): AST, ALT, ALKPHOS, BILITOT, PROT, ALBUMIN in the last 168 hours. No results for input(s): LIPASE, AMYLASE in the last 168 hours. No results for input(s): AMMONIA in the last 168 hours. Cardiac Enzymes: No results for input(s): CKTOTAL, CKMB, CKMBINDEX, TROPONINI in the last 168 hours. BNP (last 3 results) Recent Labs    09/26/20 2030  BNP 53.2    ProBNP (last 3 results) No results for input(s): PROBNP in the last 8760 hours.  CBG: Recent Labs  Lab 09/27/20 0852 09/27/20 1302 09/27/20 2138 09/28/20 0643 09/28/20 1055  GLUCAP 120* 148* 135* 112* 117*   Recent Results (from the past 240 hour(s))  Resp Panel by RT-PCR (Flu A&B, Covid) Nasopharyngeal Swab     Status: None   Collection Time: 09/26/20  8:18 PM   Specimen: Nasopharyngeal Swab; Nasopharyngeal(NP) swabs in vial transport medium  Result Value Ref Range Status   SARS Coronavirus 2 by RT PCR NEGATIVE NEGATIVE Final    Comment: (NOTE) SARS-CoV-2 target nucleic acids are NOT DETECTED.  The SARS-CoV-2 RNA is generally detectable in upper respiratory specimens during the acute phase of infection. The lowest concentration of SARS-CoV-2 viral copies this assay can detect is 138 copies/mL. A negative result does not preclude SARS-Cov-2 infection and should not be used as the sole basis for treatment or other patient management decisions. A negative result may occur with  improper specimen collection/handling, submission of specimen other than nasopharyngeal swab, presence of viral mutation(s) within the areas targeted by this assay, and inadequate number of viral copies(<138 copies/mL). A negative result must be combined  with clinical observations, patient history, and epidemiological information. The expected result is Negative.  Fact Sheet for Patients:  EntrepreneurPulse.com.au  Fact Sheet for Healthcare Providers:  IncredibleEmployment.be  This test is no t yet approved or cleared by the [place] FDA and  has been authorized for detection and/or diagnosis of SARS-CoV-2 by FDA under an Emergency Use Authorization (EUA). This EUA will remain  in effect (meaning this test can be used) for the duration of the COVID-19 declaration under Section 564(b)(1) of the Act, 21 U.S.C.section 360bbb-3(b)(1), unless the authorization is terminated  or revoked sooner.       Influenza A by PCR NEGATIVE NEGATIVE Final   Influenza B by PCR NEGATIVE NEGATIVE Final    Comment: (NOTE) The Xpert Xpress SARS-CoV-2/FLU/RSV plus assay is intended as an aid in the diagnosis of influenza from Nasopharyngeal swab specimens and should not be used as a sole basis for treatment. Nasal washings and aspirates are unacceptable for Xpert Xpress SARS-CoV-2/FLU/RSV testing.  Fact Sheet for Patients: EntrepreneurPulse.com.au  Fact Sheet for Healthcare Providers: IncredibleEmployment.be  This test is not yet approved or cleared by the [place] FDA and has been authorized for detection and/or diagnosis of SARS-CoV-2 by FDA under an Emergency Use Authorization (EUA). This EUA will remain in effect (meaning this test can be used) for the duration of the COVID-19 declaration under Section 564(b)(1) of the Act, 21 U.S.C. section 360bbb-3(b)(1), unless the authorization is terminated or revoked.  Performed at [facility], [place] [address], [place], [place] [address]   MRSA PCR Screening     Status: None   Collection Time: 09/27/20  5:21 PM   Specimen: Nasopharyngeal  Result Value Ref Range Status   MRSA by PCR NEGATIVE NEGATIVE Final     Comment:        The GeneXpert MRSA Assay (FDA approved for NASAL specimens only), is one component of a comprehensive MRSA colonization surveillance program. It is not intended to diagnose MRSA infection nor to guide or monitor treatment for MRSA infections. Performed at [facility], [place] [address] [place], [place] [address]      Studies: DG Chest 2 View  Result Date: 09/26/2020 CLINICAL DATA:  Shortness of breath and chest pain with history of MI and multiple stents. EXAM: CHEST - 2 VIEW COMPARISON:  Chest radiograph February 03, 2019 and chest CT June 09, 2020 FINDINGS: Enlarged cardiac silhouette. Stable elevated right hemidiaphragm with associated scarring. Bibasilar interstitial opacities. Similar bilateral pleural plaques. Similar changes of prior right anterior rib resections. Prior right clavicle fracture. IMPRESSION: Bibasilar interstitial opacities superimposed on chronic lung changes. Findings may reflect pulmonary edema or infection. Electronically Signed   By: Dahlia Bailiff MD   On: 09/26/2020 18:37   ECHOCARDIOGRAM COMPLETE  Result Date: 09/27/2020    ECHOCARDIOGRAM REPORT   Patient Name:   Jerry [name] Date of Exam: 09/27/2020 Medical Rec #:  [id number]        Height:       74.0 in Accession #:    [id number]       Weight:       303.0 lb Date of Birth:  Jun 19, 1951       BSA:          2.593 m Patient Age:    71 years         BP:           122/73 mmHg Patient Gender: M                HR:           72 bpm. Exam Location:  Inpatient Procedure: 2D Echo, Cardiac Doppler, Color Doppler and Intracardiac            Opacification Agent Indications:    I50.33 Acute on chronic diastolic (congestive) heart failure  History:        Patient has prior history of Echocardiogram examinations, most                 recent 08/26/2019. CHF, Previous Myocardial Infarction and CAD,                 COPD, Signs/Symptoms:Dyspnea; Risk Factors:Hypertension,                 Dyslipidemia, Current  Smoker and GERD.  Sonographer:    Jonelle Sidle Dance Referring Phys: [id number] Diagonal  1. Left ventricular ejection fraction, by estimation, is 55 to 60%. The left ventricle has normal function. The left ventricle demonstrates regional wall motion abnormalities (see scoring diagram/findings for description). There is mild concentric left ventricular hypertrophy. Left ventricular diastolic parameters are indeterminate. There is hypokinesis of the left ventricular, basal inferior wall.  2. Right ventricular systolic function is normal. The right ventricular size is normal.  3. Left atrial size was mildly dilated.  4. The mitral valve is normal in structure. No evidence of mitral valve regurgitation. No evidence of mitral stenosis.  5. The aortic valve has an indeterminant number of cusps. There is mild calcification of the aortic valve. There is moderate thickening of the aortic valve. Aortic valve regurgitation is trivial. Mild to moderate aortic valve sclerosis/calcification is present, without any evidence of aortic stenosis.  6. Aortic dilatation noted. There is borderline dilatation of the ascending aorta, measuring 38 mm. Comparison(s): No significant change from prior study. FINDINGS  Left Ventricle: Left ventricular ejection fraction, by estimation, is 55 to 60%. The left ventricle has normal function. The left ventricle demonstrates regional wall motion abnormalities. Definity contrast agent was given IV to delineate the left ventricular endocardial borders. The left ventricular internal cavity size was normal in size. There is mild concentric left ventricular hypertrophy. Left ventricular diastolic parameters are indeterminate. Right Ventricle: The right ventricular size is normal. Right vetricular wall thickness was not well visualized. Right ventricular systolic function is normal. Left Atrium: Left atrial size was mildly dilated. Right Atrium: Right atrial size was normal in size.  Pericardium: There is no evidence of pericardial effusion. Presence of pericardial fat pad. Mitral Valve: The mitral valve is normal in structure. No evidence of mitral valve regurgitation. No evidence of mitral valve stenosis. Tricuspid Valve: The tricuspid valve is normal in structure. Tricuspid valve regurgitation is trivial. No evidence of  tricuspid stenosis. Aortic Valve: The aortic valve has an indeterminant number of cusps. There is mild calcification of the aortic valve. There is moderate thickening of the aortic valve. Aortic valve regurgitation is trivial. Mild to moderate aortic valve sclerosis/calcification is present, without any evidence of aortic stenosis. Pulmonic Valve: The pulmonic valve was not well visualized. Pulmonic valve regurgitation is not visualized. Aorta: Aortic dilatation noted. There is borderline dilatation of the ascending aorta, measuring 38 mm. Venous: The inferior vena cava was not well visualized. IAS/Shunts: The atrial septum is grossly normal.  LEFT VENTRICLE PLAX 2D LVIDd:         5.60 cm LVIDs:         5.10 cm LV PW:         1.50 cm LV IVS:        1.40 cm LVOT diam:     2.10 cm LV SV:         61 LV SV Index:   24 LVOT Area:     3.46 cm  RIGHT VENTRICLE             IVC RV Basal diam:  3.50 cm     IVC diam: 2.30 cm RV Mid diam:    2.30 cm RV S prime:     15.90 cm/s TAPSE (M-mode): 2.6 cm LEFT ATRIUM             Index       RIGHT ATRIUM           Index LA diam:        4.20 cm 1.62 cm/m  RA Area:     21.00 cm LA Vol (A2C):   78.4 ml 30.23 ml/m RA Volume:   63.80 ml  24.60 ml/m LA Vol (A4C):   30.4 ml 11.72 ml/m LA Biplane Vol: 52.0 ml 20.05 ml/m  AORTIC VALVE LVOT Vmax:   87.90 cm/s LVOT Vmean:  54.300 cm/s LVOT VTI:    0.177 m  AORTA Ao Root diam: 4.00 cm Ao Asc diam:  3.80 cm MITRAL VALVE MV Area (PHT): 3.03 cm    SHUNTS MV Decel Time: 250 msec    Systemic VTI:  0.18 m MV E velocity: 71.30 cm/s  Systemic Diam: 2.10 cm MV A velocity: 95.60 cm/s MV E/A ratio:  0.75  Buford Dresser MD Electronically signed by Buford Dresser MD Signature Date/Time: 09/27/2020/2:42:43 PM    Final       Flora Lipps, MD  [facility] 09/28/2020  If 7PM-7AM, please contact night-coverage

## 2020-09-28 NOTE — Evaluation (Signed)
Physical Therapy Evaluation Patient Details Name: Jerry [name] MRN: [id number] DOB: [birth year] Today's Date: 09/28/2020   History of Present Illness  Pt is a 70 y/o male admitted secondary to chest pressure and SOB. Workup pending. PMH includes CAD, HTN, dCHF, COPD, anemia, R TKA, and tobacco use.  Clinical Impression  Pt admitted secondary to problem above with deficits below. Pt requiring min guard A for mobility using RW initially. However, in middle of gait, pt became light headed and sats dropped to 80% on 5L, HR elevated to 129, and pt started complaining of chest pressure. Returned to room for seated rest and required 5-6 mins to recover. Required min A to safely return to room. Notified MD and RN. Given current deficits, do not feel pt is safe to d/c home. May need to consider SNF level therapies if pt does not progress with mobility tasks. Will continue to follow acutely and update recommendations based on pt progression.     Follow Up Recommendations Supervision for mobility/OOB (TBD pending progression; SNF vs HHPT)    Equipment Recommendations  Other (comment) (TBD)    Recommendations for Other Services       Precautions / Restrictions Precautions Precautions: Fall;Other (comment) Precaution Comments: watch O2 Restrictions Weight Bearing Restrictions: No      Mobility  Bed Mobility Overal bed mobility: Modified Independent                  Transfers Overall transfer level: Needs assistance Equipment used: Rolling walker (2 wheeled) Transfers: Sit to/from Stand Sit to Stand: Min guard         General transfer comment: Min guard for safety. Cues for safe hand placement. When returning to sitting following gait, required min A for controlled descent, as pt with lightheadedness, elevated HR and low O2.  Ambulation/Gait Ambulation/Gait assistance: Min assist;Min guard Gait Distance (Feet): 40 Feet Assistive device: Rolling walker (2 wheeled) Gait  Pattern/deviations: Decreased stride length;Step-through pattern Gait velocity: Decreased   General Gait Details: Initially requiring min guard A, however, in middle of gait, pt became light headed and sats dropped to 80% on 5L, HR elevated to 129, and pt started complaining of chest pressure. Returned to room for seated rest and required 5-6 mins to recover.  Stairs            Wheelchair Mobility    Modified Rankin (Stroke Patients Only)       Balance Overall balance assessment: Needs assistance Sitting-balance support: No upper extremity supported;Feet supported Sitting balance-Leahy Scale: Fair     Standing balance support: Bilateral upper extremity supported;During functional activity Standing balance-Leahy Scale: Poor Standing balance comment: Reliant on BUE support                             Pertinent Vitals/Pain Pain Assessment: No/denies pain    Home Living Family/patient expects to be discharged to:: Private residence Living Arrangements: Children;Other relatives (daughter in law) Available Help at Discharge: Family;Available 24 hours/day Type of Home: House Home Access: Stairs to enter Entrance Stairs-Rails: Right;Left;Can reach both Entrance Stairs-Number of Steps: 3 Home Layout: One level Home Equipment: Bedside commode;Walker - 2 wheels;Cane - single point      Prior Function Level of Independence: Independent with assistive device(s)         Comments: Uses cane for ambulation     Hand Dominance        Extremity/Trunk Assessment   Upper Extremity Assessment Upper Extremity  Assessment: Defer to OT evaluation    Lower Extremity Assessment Lower Extremity Assessment: Generalized weakness    Cervical / Trunk Assessment Cervical / Trunk Assessment: Kyphotic  Communication   Communication: No difficulties  Cognition Arousal/Alertness: Awake/alert Behavior During Therapy: WFL for tasks assessed/performed Overall Cognitive  Status: Within Functional Limits for tasks assessed                                        General Comments      Exercises     Assessment/Plan    PT Assessment Patient needs continued PT services  PT Problem List Decreased strength;Decreased balance;Decreased activity tolerance;Decreased mobility;Cardiopulmonary status limiting activity;Decreased knowledge of use of DME;Decreased knowledge of precautions       PT Treatment Interventions DME instruction;Gait training;Functional mobility training;Therapeutic activities;Balance training;Therapeutic exercise;Patient/family education;Stair training    PT Goals (Current goals can be found in the Care Plan section)  Acute Rehab PT Goals Patient Stated Goal: to figure out what is going on PT Goal Formulation: With patient Time For Goal Achievement: 10/12/20 Potential to Achieve Goals: Good    Frequency Min 3X/week   Barriers to discharge        Co-evaluation               AM-PAC PT "6 Clicks" Mobility  Outcome Measure Help needed turning from your back to your side while in a flat bed without using bedrails?: None Help needed moving from lying on your back to sitting on the side of a flat bed without using bedrails?: None Help needed moving to and from a bed to a chair (including a wheelchair)?: A Little Help needed standing up from a chair using your arms (e.g., wheelchair or bedside chair)?: A Little Help needed to walk in hospital room?: A Little Help needed climbing 3-5 steps with a railing? : A Lot 6 Click Score: 19    End of Session Equipment Utilized During Treatment: Gait belt Activity Tolerance: Treatment limited secondary to medical complications (Comment) (chest pressure, elevated HR, decreased oxygen sats) Patient left: in bed;with call bell/phone within reach Nurse Communication: Mobility status;Other (comment) (elevated HR, chest pressure, and oxygen sats) PT Visit Diagnosis: Unsteadiness  on feet (R26.81);Muscle weakness (generalized) (M62.81)    Time: 6384-6659 PT Time Calculation (min) (ACUTE ONLY): 17 min   Charges:   PT Evaluation $PT Eval Moderate Complexity: 1 Mod          Reuel Derby, PT, DPT  Acute Rehabilitation Services  Pager: [phone] Office: [phone]   Rudean Hitt 09/28/2020, 3:34 PM

## 2020-09-29 ENCOUNTER — Encounter (HOSPITAL_COMMUNITY)
Admission: EM | Disposition: A | Discharge status: Home Health | Payer: Self-pay | Source: Home / Self Care | Attending: Internal Medicine

## 2020-09-29 DIAGNOSIS — I25119 Atherosclerotic heart disease of native coronary artery with unspecified angina pectoris: Secondary | ICD-10-CM

## 2020-09-29 HISTORY — PX: RIGHT/LEFT HEART CATH AND CORONARY ANGIOGRAPHY: CATH118266

## 2020-09-29 LAB — POCT I-STAT 7, (LYTES, BLD GAS, ICA,H+H)
Acid-Base Excess: 11 mmol/L — ABNORMAL HIGH (ref 0.0–2.0)
Bicarbonate: 37.9 mmol/L — ABNORMAL HIGH (ref 20.0–28.0)
Calcium, Ion: 1.18 mmol/L (ref 1.15–1.40)
HCT: 33 % — ABNORMAL LOW (ref 39.0–52.0)
Hemoglobin: 11.2 g/dL — ABNORMAL LOW (ref 13.0–17.0)
O2 Saturation: 96 %
Potassium: 4 mmol/L (ref 3.5–5.1)
Sodium: 137 mmol/L (ref 135–145)
TCO2: 40 mmol/L — ABNORMAL HIGH (ref 22–32)
pCO2 arterial: 63 mmHg — ABNORMAL HIGH (ref 32.0–48.0)
pH, Arterial: 7.388 (ref 7.350–7.450)
pO2, Arterial: 88 mmHg (ref 83.0–108.0)

## 2020-09-29 LAB — CBC
HCT: 33.6 % — ABNORMAL LOW (ref 39.0–52.0)
Hemoglobin: 10.6 g/dL — ABNORMAL LOW (ref 13.0–17.0)
MCH: 28.2 pg (ref 26.0–34.0)
MCHC: 31.5 g/dL (ref 30.0–36.0)
MCV: 89.4 fL (ref 80.0–100.0)
Platelets: 250 10*3/uL (ref 150–400)
RBC: 3.76 MIL/uL — ABNORMAL LOW (ref 4.22–5.81)
RDW: 15 % (ref 11.5–15.5)
WBC: 7.3 10*3/uL (ref 4.0–10.5)
nRBC: 0 % (ref 0.0–0.2)

## 2020-09-29 LAB — LIPID PANEL
Cholesterol: 126 mg/dL (ref 0–200)
HDL: 33 mg/dL — ABNORMAL LOW (ref >40)
LDL Cholesterol: 50 mg/dL (ref 0–99)
Total CHOL/HDL Ratio: 3.8 RATIO
Triglycerides: 213 mg/dL — ABNORMAL HIGH (ref <150)
VLDL: 43 mg/dL — ABNORMAL HIGH (ref 0–40)

## 2020-09-29 LAB — COMPREHENSIVE METABOLIC PANEL
ALT: 16 U/L (ref 0–44)
AST: 28 U/L (ref 15–41)
Albumin: 3.5 g/dL (ref 3.5–5.0)
Alkaline Phosphatase: 66 U/L (ref 38–126)
Anion gap: 12 (ref 5–15)
BUN: 10 mg/dL (ref 8–23)
CO2: 32 mmol/L (ref 22–32)
Calcium: 9.1 mg/dL (ref 8.9–10.3)
Chloride: 94 mmol/L — ABNORMAL LOW (ref 98–111)
Creatinine, Ser: 0.98 mg/dL (ref 0.61–1.24)
GFR, Estimated: >60 mL/min (ref >60)
Glucose, Bld: 114 mg/dL — ABNORMAL HIGH (ref 70–99)
Potassium: 3.6 mmol/L (ref 3.5–5.1)
Sodium: 138 mmol/L (ref 135–145)
Total Bilirubin: 0.7 mg/dL (ref 0.3–1.2)
Total Protein: 6.9 g/dL (ref 6.5–8.1)

## 2020-09-29 LAB — GLUCOSE, CAPILLARY
Glucose-Capillary: 116 mg/dL — ABNORMAL HIGH (ref 70–99)
Glucose-Capillary: 163 mg/dL — ABNORMAL HIGH (ref 70–99)
Glucose-Capillary: 127 mg/dL — ABNORMAL HIGH (ref 70–99)
Glucose-Capillary: 181 mg/dL — ABNORMAL HIGH (ref 70–99)

## 2020-09-29 LAB — POCT I-STAT EG7
Acid-Base Excess: 11 mmol/L — ABNORMAL HIGH (ref 0.0–2.0)
Acid-Base Excess: 12 mmol/L — ABNORMAL HIGH (ref 0.0–2.0)
Bicarbonate: 39.2 mmol/L — ABNORMAL HIGH (ref 20.0–28.0)
Bicarbonate: 39.8 mmol/L — ABNORMAL HIGH (ref 20.0–28.0)
Calcium, Ion: 1.21 mmol/L (ref 1.15–1.40)
Calcium, Ion: 1.22 mmol/L (ref 1.15–1.40)
HCT: 35 % — ABNORMAL LOW (ref 39.0–52.0)
HCT: 35 % — ABNORMAL LOW (ref 39.0–52.0)
Hemoglobin: 11.9 g/dL — ABNORMAL LOW (ref 13.0–17.0)
Hemoglobin: 11.9 g/dL — ABNORMAL LOW (ref 13.0–17.0)
O2 Saturation: 67 %
O2 Saturation: 68 %
Potassium: 4.1 mmol/L (ref 3.5–5.1)
Potassium: 4.1 mmol/L (ref 3.5–5.1)
Sodium: 138 mmol/L (ref 135–145)
Sodium: 139 mmol/L (ref 135–145)
TCO2: 41 mmol/L — ABNORMAL HIGH (ref 22–32)
TCO2: 42 mmol/L — ABNORMAL HIGH (ref 22–32)
pCO2, Ven: 67.8 mmHg — ABNORMAL HIGH (ref 44.0–60.0)
pCO2, Ven: 67.9 mmHg — ABNORMAL HIGH (ref 44.0–60.0)
pH, Ven: 7.37 (ref 7.250–7.430)
pH, Ven: 7.376 (ref 7.250–7.430)
pO2, Ven: 37 mmHg (ref 32.0–45.0)
pO2, Ven: 38 mmHg (ref 32.0–45.0)

## 2020-09-29 LAB — MAGNESIUM: Magnesium: 1.9 mg/dL (ref 1.7–2.4)

## 2020-09-29 LAB — PHOSPHORUS: Phosphorus: 4 mg/dL (ref 2.5–4.6)

## 2020-09-29 SURGERY — RIGHT/LEFT HEART CATH AND CORONARY ANGIOGRAPHY
Anesthesia: LOCAL

## 2020-09-29 MED ORDER — SODIUM CHLORIDE 0.9 % IV SOLN: 250.0000 mL | INTRAVENOUS | Status: DC | PRN

## 2020-09-29 MED ORDER — VERAPAMIL HCL 2.5 MG/ML IV SOLN
INTRAVENOUS | Status: DC | PRN
  Administered 2020-09-29: 10 mL via INTRA_ARTERIAL

## 2020-09-29 MED ORDER — HEPARIN SODIUM (PORCINE) 1000 UNIT/ML IJ SOLN
INTRAMUSCULAR | Status: AC
  Filled 2020-09-29: qty 1

## 2020-09-29 MED ORDER — SODIUM CHLORIDE 0.9 % IV SOLN: INTRAVENOUS | Status: DC

## 2020-09-29 MED ORDER — HYDRALAZINE HCL 20 MG/ML IJ SOLN: 10.0000 mg | INTRAMUSCULAR | Status: AC | PRN

## 2020-09-29 MED ORDER — LIDOCAINE HCL (PF) 1 % IJ SOLN
INTRAMUSCULAR | Status: AC
  Filled 2020-09-29: qty 30

## 2020-09-29 MED ORDER — HEPARIN (PORCINE) IN NACL 1000-0.9 UT/500ML-% IV SOLN
INTRAVENOUS | Status: AC
  Filled 2020-09-29: qty 1000

## 2020-09-29 MED ORDER — CLOPIDOGREL BISULFATE 75 MG PO TABS
75.0000 mg | ORAL_TABLET | Freq: Every day | ORAL | Status: DC
  Administered 2020-09-30 – 2020-10-01 (×2): 75 mg via ORAL
  Filled 2020-09-29 (×2): qty 1

## 2020-09-29 MED ORDER — ACETAMINOPHEN 325 MG PO TABS: 650.0000 mg | ORAL_TABLET | ORAL | Status: DC | PRN

## 2020-09-29 MED ORDER — VERAPAMIL HCL 2.5 MG/ML IV SOLN
INTRAVENOUS | Status: AC
  Filled 2020-09-29: qty 2

## 2020-09-29 MED ORDER — FENTANYL CITRATE (PF) 100 MCG/2ML IJ SOLN
INTRAMUSCULAR | Status: DC | PRN
  Administered 2020-09-29: 25 ug via INTRAVENOUS

## 2020-09-29 MED ORDER — ASPIRIN 81 MG PO CHEW
81.0000 mg | CHEWABLE_TABLET | Freq: Every day | ORAL | Status: DC
  Administered 2020-09-29 – 2020-10-02 (×4): 81 mg via ORAL
  Filled 2020-09-29 (×4): qty 1

## 2020-09-29 MED ORDER — SODIUM CHLORIDE 0.9% FLUSH: 3.0000 mL | INTRAVENOUS | Status: DC | PRN

## 2020-09-29 MED ORDER — HEPARIN (PORCINE) IN NACL 1000-0.9 UT/500ML-% IV SOLN
INTRAVENOUS | Status: DC | PRN
  Administered 2020-09-29 (×2): 500 mL

## 2020-09-29 MED ORDER — HEPARIN SODIUM (PORCINE) 1000 UNIT/ML IJ SOLN
INTRAMUSCULAR | Status: DC | PRN
  Administered 2020-09-29: 7000 [IU] via INTRAVENOUS

## 2020-09-29 MED ORDER — LABETALOL HCL 5 MG/ML IV SOLN: 10.0000 mg | INTRAVENOUS | Status: AC | PRN

## 2020-09-29 MED ORDER — IOHEXOL 350 MG/ML SOLN
INTRAVENOUS | Status: DC | PRN
  Administered 2020-09-29: 50 mL via INTRA_ARTERIAL

## 2020-09-29 MED ORDER — ONDANSETRON HCL 4 MG/2ML IJ SOLN: 4.0000 mg | Freq: Four times a day (QID) | INTRAMUSCULAR | Status: DC | PRN

## 2020-09-29 MED ORDER — MIDAZOLAM HCL 2 MG/2ML IJ SOLN
INTRAMUSCULAR | Status: DC | PRN
  Administered 2020-09-29: 1 mg via INTRAVENOUS

## 2020-09-29 MED ORDER — SODIUM CHLORIDE 0.9% FLUSH
3.0000 mL | Freq: Two times a day (BID) | INTRAVENOUS | Status: DC
  Administered 2020-09-29 – 2020-10-02 (×7): 3 mL via INTRAVENOUS

## 2020-09-29 MED ORDER — FENTANYL CITRATE (PF) 100 MCG/2ML IJ SOLN
INTRAMUSCULAR | Status: AC
  Filled 2020-09-29: qty 2

## 2020-09-29 MED ORDER — LIDOCAINE HCL (PF) 1 % IJ SOLN
INTRAMUSCULAR | Status: DC | PRN
  Administered 2020-09-29: 2 mL via INTRADERMAL
  Administered 2020-09-29: 20 mL via INTRADERMAL

## 2020-09-29 MED ORDER — SODIUM CHLORIDE 0.9 % IV SOLN: INTRAVENOUS | Status: AC

## 2020-09-29 MED ORDER — MIDAZOLAM HCL 2 MG/2ML IJ SOLN
INTRAMUSCULAR | Status: AC
  Filled 2020-09-29: qty 2

## 2020-09-29 MED ORDER — SODIUM CHLORIDE 0.9% FLUSH
3.0000 mL | Freq: Two times a day (BID) | INTRAVENOUS | Status: DC
  Administered 2020-09-29 – 2020-10-02 (×5): 3 mL via INTRAVENOUS

## 2020-09-29 SURGICAL SUPPLY — 20 items
BAG SNAP BAND KOVER 36X36 (MISCELLANEOUS) ×1 IMPLANT
CATH BALLN WEDGE 5F 110CM (CATHETERS) ×1 IMPLANT
CATH INFINITI 5FR ANG PIGTAIL (CATHETERS) ×1 IMPLANT
CATH OPTITORQUE TIG 4.0 5F (CATHETERS) ×1 IMPLANT
CATH SWAN GANZ 7F STRAIGHT (CATHETERS) ×1 IMPLANT
CLOSURE MYNX CONTROL 6F/7F (Vascular Products) ×1 IMPLANT
COVER DOME SNAP 22 D (MISCELLANEOUS) ×1 IMPLANT
DEVICE RAD COMP TR BAND LRG (VASCULAR PRODUCTS) ×1 IMPLANT
GLIDESHEATH SLEND A-KIT 6F 22G (SHEATH) ×1 IMPLANT
GUIDEWIRE INQWIRE 1.5J.035X260 (WIRE) IMPLANT
INQWIRE 1.5J .035X260CM (WIRE) ×2
KIT HEART LEFT (KITS) ×2 IMPLANT
MAT PREVALON FULL STRYKER (MISCELLANEOUS) ×1 IMPLANT
PACK CARDIAC CATHETERIZATION (CUSTOM PROCEDURE TRAY) ×2 IMPLANT
SHEATH GLIDE SLENDER 4/5FR (SHEATH) ×1 IMPLANT
SHEATH PINNACLE 5F 10CM (SHEATH) IMPLANT
SHEATH PINNACLE 7F 10CM (SHEATH) ×1 IMPLANT
TRANSDUCER W/STOPCOCK (MISCELLANEOUS) ×2 IMPLANT
TUBING CIL FLEX 10 FLL-RA (TUBING) ×2 IMPLANT
WIRE HI TORQ VERSACORE-J 145CM (WIRE) ×1 IMPLANT

## 2020-09-29 NOTE — Progress Notes (Signed)
Received pt with c/o pain @ back & verbalized unable to urinate. Dilaudid po given as PRN dose. In & out cath done with urine output of 1,650 ml. Will continue to monitor pt.

## 2020-09-29 NOTE — H&P (View-Only) (Signed)
Progress Note  Patient Name: Jerry [name] Date of Encounter: 09/29/2020  [facility] Cardiologist: Quay Burow, MD   Subjective   70 year old gentleman admitted with progressive shortness of breath with exertion.  He has COPD and diastolic congestive heart failure.  He eats a very high salt diet. He has diuresed well overnight.  He is net 4.4 L negative since being admitted. Blood pressure has improved.  He still has severe dyspnea with any activity . Clearly is not better .   Inpatient Medications    Scheduled Meds: . aspirin EC  81 mg Oral Daily  . atorvastatin  80 mg Oral q1800  . cholecalciferol  1,000 Units Oral Daily  . clopidogrel  75 mg Oral Daily  . dutasteride  0.5 mg Oral QHS  . enoxaparin (LOVENOX) injection  40 mg Subcutaneous Q24H  . escitalopram  20 mg Oral Daily  . ferrous sulfate  325 mg Oral Daily  . fluticasone furoate-vilanterol  1 puff Inhalation Daily   And  . umeclidinium bromide  1 puff Inhalation Daily  . furosemide  40 mg Intravenous BID  . losartan  25 mg Oral Daily  . mouth rinse  15 mL Mouth Rinse BID  . metoprolol tartrate  25 mg Oral BID  . nortriptyline  40 mg Oral QHS  . pantoprazole  40 mg Oral BID  . polyethylene glycol  17 g Oral QPM  . potassium chloride  40 mEq Oral BID  . senna  2 tablet Oral BID  . sertraline  50 mg Oral Daily  . tamsulosin  0.8 mg Oral Daily  . traZODone  300 mg Oral QHS   Continuous Infusions:  PRN Meds: acetaminophen, albuterol, bisacodyl, calcium carbonate, HYDROmorphone, nitroGLYCERIN, ondansetron (ZOFRAN) IV, tiZANidine   Vital Signs    Vitals:   09/28/20 2120 09/29/20 0434 09/29/20 0746 09/29/20 0814  BP: (!) 128/55 (!) 110/49  (!) 122/54  Pulse: 76 63  77  Resp:  19  14  Temp:  97.8 F (36.6 C)  98 F (36.7 C)  TempSrc:  Oral Oral Oral  SpO2:  94%    Weight:      Height:        Intake/Output Summary (Last 24 hours) at 09/29/2020 0933 Last data filed at 09/29/2020 0550 Gross per  24 hour  Intake 540 ml  Output 1875 ml  Net -1335 ml   Last 3 Weights 09/28/2020 09/27/2020 09/23/2020  Weight (lbs) 293 lb 14 oz 300 lb 303 lb  Weight (kg) 133.3 kg 136.079 kg 137.44 kg      Telemetry     NSR - Personally Reviewed  ECG     - Personally Reviewed  Physical Exam   GEN: middle age man, morbidly obese    Neck: No JVD Cardiac: RRR, no murmurs, rubs, or gallops.  Respiratory: Clear to auscultation bilaterally. GI: Soft, nontender, non-distended  MS: No edema; No deformity.  Good R radial pulse  Neuro:  Nonfocal  Psych: Normal affect   Labs    High Sensitivity Troponin:   Recent Labs  Lab 09/26/20 1743 09/26/20 2030  TROPONINIHS 7 7      Chemistry Recent Labs  Lab 09/26/20 1743 09/28/20 0039 09/29/20 0121  NA 138 138 138  K 4.1 3.2* 3.6  CL 94* 92* 94*  CO2 36* 34* 32  GLUCOSE 123* 124* 114*  BUN 9 11 10   CREATININE 0.97 0.95 0.98  CALCIUM 9.5 9.0 9.1  PROT  --   --  6.9  ALBUMIN  --   --  3.5  AST  --   --  28  ALT  --   --  16  ALKPHOS  --   --  66  BILITOT  --   --  0.7  GFRNONAA >60 >60 >60  ANIONGAP 8 12 12      Hematology Recent Labs  Lab 09/26/20 1743 09/29/20 0137  WBC 7.2 7.3  RBC 3.95* 3.76*  HGB 11.1* 10.6*  HCT 35.7* 33.6*  MCV 90.4 89.4  MCH 28.1 28.2  MCHC 31.1 31.5  RDW 14.4 15.0  PLT 257 250    BNP Recent Labs  Lab 09/26/20 2030  BNP 53.2     DDimer  Recent Labs  Lab 09/26/20 2030  DDIMER 0.33     Radiology    ECHOCARDIOGRAM COMPLETE  Result Date: 09/27/2020    ECHOCARDIOGRAM REPORT   Patient Name:   Jerry [name] Date of Exam: 09/27/2020 Medical Rec #:  [id number]        Height:       74.0 in Accession #:    [id number]       Weight:       303.0 lb Date of Birth:  25-Sep-1950       BSA:          2.593 m Patient Age:    55 years         BP:           122/73 mmHg Patient Gender: M                HR:           72 bpm. Exam Location:  Inpatient Procedure: 2D Echo, Cardiac Doppler, Color Doppler and  Intracardiac            Opacification Agent Indications:    I50.33 Acute on chronic diastolic (congestive) heart failure  History:        Patient has prior history of Echocardiogram examinations, most                 recent 08/26/2019. CHF, Previous Myocardial Infarction and CAD,                 COPD, Signs/Symptoms:Dyspnea; Risk Factors:Hypertension,                 Dyslipidemia, Current Smoker and GERD.  Sonographer:    Jonelle Sidle Dance Referring Phys: [id number] [place]  1. Left ventricular ejection fraction, by estimation, is 55 to 60%. The left ventricle has normal function. The left ventricle demonstrates regional wall motion abnormalities (see scoring diagram/findings for description). There is mild concentric left ventricular hypertrophy. Left ventricular diastolic parameters are indeterminate. There is hypokinesis of the left ventricular, basal inferior wall.  2. Right ventricular systolic function is normal. The right ventricular size is normal.  3. Left atrial size was mildly dilated.  4. The mitral valve is normal in structure. No evidence of mitral valve regurgitation. No evidence of mitral stenosis.  5. The aortic valve has an indeterminant number of cusps. There is mild calcification of the aortic valve. There is moderate thickening of the aortic valve. Aortic valve regurgitation is trivial. Mild to moderate aortic valve sclerosis/calcification is present, without any evidence of aortic stenosis.  6. Aortic dilatation noted. There is borderline dilatation of the ascending aorta, measuring 38 mm. Comparison(s): No significant change from prior study. FINDINGS  Left Ventricle: Left ventricular ejection fraction, by estimation, is  55 to 60%. The left ventricle has normal function. The left ventricle demonstrates regional wall motion abnormalities. Definity contrast agent was given IV to delineate the left ventricular endocardial borders. The left ventricular internal cavity size was  normal in size. There is mild concentric left ventricular hypertrophy. Left ventricular diastolic parameters are indeterminate. Right Ventricle: The right ventricular size is normal. Right vetricular wall thickness was not well visualized. Right ventricular systolic function is normal. Left Atrium: Left atrial size was mildly dilated. Right Atrium: Right atrial size was normal in size. Pericardium: There is no evidence of pericardial effusion. Presence of pericardial fat pad. Mitral Valve: The mitral valve is normal in structure. No evidence of mitral valve regurgitation. No evidence of mitral valve stenosis. Tricuspid Valve: The tricuspid valve is normal in structure. Tricuspid valve regurgitation is trivial. No evidence of tricuspid stenosis. Aortic Valve: The aortic valve has an indeterminant number of cusps. There is mild calcification of the aortic valve. There is moderate thickening of the aortic valve. Aortic valve regurgitation is trivial. Mild to moderate aortic valve sclerosis/calcification is present, without any evidence of aortic stenosis. Pulmonic Valve: The pulmonic valve was not well visualized. Pulmonic valve regurgitation is not visualized. Aorta: Aortic dilatation noted. There is borderline dilatation of the ascending aorta, measuring 38 mm. Venous: The inferior vena cava was not well visualized. IAS/Shunts: The atrial septum is grossly normal.  LEFT VENTRICLE PLAX 2D LVIDd:         5.60 cm LVIDs:         5.10 cm LV PW:         1.50 cm LV IVS:        1.40 cm LVOT diam:     2.10 cm LV SV:         61 LV SV Index:   24 LVOT Area:     3.46 cm  RIGHT VENTRICLE             IVC RV Basal diam:  3.50 cm     IVC diam: 2.30 cm RV Mid diam:    2.30 cm RV S prime:     15.90 cm/s TAPSE (M-mode): 2.6 cm LEFT ATRIUM             Index       RIGHT ATRIUM           Index LA diam:        4.20 cm 1.62 cm/m  RA Area:     21.00 cm LA Vol (A2C):   78.4 ml 30.23 ml/m RA Volume:   63.80 ml  24.60 ml/m LA Vol (A4C):    30.4 ml 11.72 ml/m LA Biplane Vol: 52.0 ml 20.05 ml/m  AORTIC VALVE LVOT Vmax:   87.90 cm/s LVOT Vmean:  54.300 cm/s LVOT VTI:    0.177 m  AORTA Ao Root diam: 4.00 cm Ao Asc diam:  3.80 cm MITRAL VALVE MV Area (PHT): 3.03 cm    SHUNTS MV Decel Time: 250 msec    Systemic VTI:  0.18 m MV E velocity: 71.30 cm/s  Systemic Diam: 2.10 cm MV A velocity: 95.60 cm/s MV E/A ratio:  0.75 Buford Dresser MD Electronically signed by Buford Dresser MD Signature Date/Time: 09/27/2020/2:42:43 PM    Final     Cardiac Studies      Patient Profile     70 y.o. male with hx of CAD, HTN, HLD chronic diastolic chf admitted with severe DOE   Assessment & Plan    1.  Severe shortness of breath with exertion: Patient has known COPD and also has known diastolic congestive heart failure.  He is diuresed around 4 L but yet still has severe shortness of breath with any sort of activity.  This may be due to critical coronary artery disease.  We will proceed we will proceed with heart catheterization today.  2.  Coronary artery disease: He has had PCI in the past.  His troponins are negative but his symptoms are consistent with recurrent coronary artery disease.  He has not improved with diuresis. We will schedule him for R and L heart catheterization later this afternoon. We discussed the risk, benefits, options of heart catheterization.  He understands and agrees to proceed.  3.  Morbid obesity: Needs to lose weight.      For questions or updates, please contact [place] Please consult [email or web address] for contact info under        Signed, Mertie Moores, MD  09/29/2020, 9:33 AM

## 2020-09-29 NOTE — Progress Notes (Signed)
Low-Sodium Diet Education Note   Nutrition Education Note RD consulted for nutrition education regarding CHF.   Intern provided "Low Sodium Nutrition Therapy" handout from the [employer]. Reviewed patient's dietary recall. Provided examples on ways to decrease sodium intake in diet. Discouraged intake of processed foods and use of salt shaker. Encouraged fresh fruits and vegetables as well as whole grain sources of carbohydrates to maximize fiber intake.   Intern discussed why it is important for patient to adhere to diet recommendations, and emphasized role of fluids, foods to avoid, and importance of weighing self daily. Teach back method used.   Pt mentioned that he typically consumes 3 meals a day. His typical consumption includes:  Breakfast - eggs or cereal  Lunch - ham sandwich  Dinner - pasta or chicken nuggets  Pt noted that he has been experiencing edema and fluid overload which has contributed to his shortness of breath. Pt's weights reviewed and show weight inconsistencies likely due to fluid.   Expect good compliance.   Body mass index is 38.92 kg/m. Pt meets criteria for unspecified obesity based on current BMI.   Current diet order was previously on Heart Healthy/Carb Modified Diet, patient was consuming approximately 100% of meals during this time. Labs and medications reviewed. No further nutrition interventions warranted at this time. RD contact information provided. If additional nutrition issues arise, please re-consult RD.   Salvadore Oxford, Dietetic Intern 09/29/2020 3:13 PM

## 2020-09-29 NOTE — Progress Notes (Signed)
Progress Note  Patient Name: Jerry [name] Date of Encounter: 09/29/2020  [facility] Cardiologist: Quay Burow, MD   Subjective   70 year old gentleman admitted with progressive shortness of breath with exertion.  He has COPD and diastolic congestive heart failure.  He eats a very high salt diet. He has diuresed well overnight.  He is net 4.4 L negative since being admitted. Blood pressure has improved.  He still has severe dyspnea with any activity . Clearly is not better .   Inpatient Medications    Scheduled Meds:  aspirin EC  81 mg Oral Daily   atorvastatin  80 mg Oral q1800   cholecalciferol  1,000 Units Oral Daily   clopidogrel  75 mg Oral Daily   dutasteride  0.5 mg Oral QHS   enoxaparin (LOVENOX) injection  40 mg Subcutaneous Q24H   escitalopram  20 mg Oral Daily   ferrous sulfate  325 mg Oral Daily   fluticasone furoate-vilanterol  1 puff Inhalation Daily   And   umeclidinium bromide  1 puff Inhalation Daily   furosemide  40 mg Intravenous BID   losartan  25 mg Oral Daily   mouth rinse  15 mL Mouth Rinse BID   metoprolol tartrate  25 mg Oral BID   nortriptyline  40 mg Oral QHS   pantoprazole  40 mg Oral BID   polyethylene glycol  17 g Oral QPM   potassium chloride  40 mEq Oral BID   senna  2 tablet Oral BID   sertraline  50 mg Oral Daily   tamsulosin  0.8 mg Oral Daily   traZODone  300 mg Oral QHS   Continuous Infusions:  PRN Meds: acetaminophen, albuterol, bisacodyl, calcium carbonate, HYDROmorphone, nitroGLYCERIN, ondansetron (ZOFRAN) IV, tiZANidine   Vital Signs    Vitals:   09/28/20 2120 09/29/20 0434 09/29/20 0746 09/29/20 0814  BP: (!) 128/55 (!) 110/49  (!) 122/54  Pulse: 76 63  77  Resp:  19  14  Temp:  97.8 F (36.6 C)  98 F (36.7 C)  TempSrc:  Oral Oral Oral  SpO2:  94%    Weight:      Height:        Intake/Output Summary (Last 24 hours) at 09/29/2020 0933 Last data filed at 09/29/2020 0550 Gross per  24 hour  Intake 540 ml  Output 1875 ml  Net -1335 ml   Last 3 Weights 09/28/2020 09/27/2020 09/23/2020  Weight (lbs) 293 lb 14 oz 300 lb 303 lb  Weight (kg) 133.3 kg 136.079 kg 137.44 kg      Telemetry     NSR - Personally Reviewed  ECG     - Personally Reviewed  Physical Exam   GEN: middle age man, morbidly obese    Neck: No JVD Cardiac: RRR, no murmurs, rubs, or gallops.  Respiratory: Clear to auscultation bilaterally. GI: Soft, nontender, non-distended  MS: No edema; No deformity.  Good R radial pulse  Neuro:  Nonfocal  Psych: Normal affect   Labs    High Sensitivity Troponin:   Recent Labs  Lab 09/26/20 1743 09/26/20 2030  TROPONINIHS 7 7      Chemistry Recent Labs  Lab 09/26/20 1743 09/28/20 0039 09/29/20 0121  NA 138 138 138  K 4.1 3.2* 3.6  CL 94* 92* 94*  CO2 36* 34* 32  GLUCOSE 123* 124* 114*  BUN 9 11 10   CREATININE 0.97 0.95 0.98  CALCIUM 9.5 9.0 9.1  PROT  --   --  6.9  ALBUMIN  --   --  3.5  AST  --   --  28  ALT  --   --  16  ALKPHOS  --   --  66  BILITOT  --   --  0.7  GFRNONAA >60 >60 >60  ANIONGAP 8 12 12      Hematology Recent Labs  Lab 09/26/20 1743 09/29/20 0137  WBC 7.2 7.3  RBC 3.95* 3.76*  HGB 11.1* 10.6*  HCT 35.7* 33.6*  MCV 90.4 89.4  MCH 28.1 28.2  MCHC 31.1 31.5  RDW 14.4 15.0  PLT 257 250    BNP Recent Labs  Lab 09/26/20 2030  BNP 53.2     DDimer  Recent Labs  Lab 09/26/20 2030  DDIMER 0.33     Radiology    ECHOCARDIOGRAM COMPLETE  Result Date: 09/27/2020    ECHOCARDIOGRAM REPORT   Patient Name:   Jerry [name] Date of Exam: 09/27/2020 Medical Rec #:  [id number]        Height:       74.0 in Accession #:    [id number]       Weight:       303.0 lb Date of Birth:  August 28, 1950       BSA:          2.593 m Patient Age:    35 years         BP:           122/73 mmHg Patient Gender: M                HR:           72 bpm. Exam Location:  Inpatient Procedure: 2D Echo, Cardiac Doppler, Color Doppler and  Intracardiac            Opacification Agent Indications:    I50.33 Acute on chronic diastolic (congestive) heart failure  History:        Patient has prior history of Echocardiogram examinations, most                 recent 08/26/2019. CHF, Previous Myocardial Infarction and CAD,                 COPD, Signs/Symptoms:Dyspnea; Risk Factors:Hypertension,                 Dyslipidemia, Current Smoker and GERD.  Sonographer:    Jonelle Sidle Dance Referring Phys: [id number] National  1. Left ventricular ejection fraction, by estimation, is 55 to 60%. The left ventricle has normal function. The left ventricle demonstrates regional wall motion abnormalities (see scoring diagram/findings for description). There is mild concentric left ventricular hypertrophy. Left ventricular diastolic parameters are indeterminate. There is hypokinesis of the left ventricular, basal inferior wall.  2. Right ventricular systolic function is normal. The right ventricular size is normal.  3. Left atrial size was mildly dilated.  4. The mitral valve is normal in structure. No evidence of mitral valve regurgitation. No evidence of mitral stenosis.  5. The aortic valve has an indeterminant number of cusps. There is mild calcification of the aortic valve. There is moderate thickening of the aortic valve. Aortic valve regurgitation is trivial. Mild to moderate aortic valve sclerosis/calcification is present, without any evidence of aortic stenosis.  6. Aortic dilatation noted. There is borderline dilatation of the ascending aorta, measuring 38 mm. Comparison(s): No significant change from prior study. FINDINGS  Left Ventricle: Left ventricular ejection fraction, by estimation, is  55 to 60%. The left ventricle has normal function. The left ventricle demonstrates regional wall motion abnormalities. Definity contrast agent was given IV to delineate the left ventricular endocardial borders. The left ventricular internal cavity size was  normal in size. There is mild concentric left ventricular hypertrophy. Left ventricular diastolic parameters are indeterminate. Right Ventricle: The right ventricular size is normal. Right vetricular wall thickness was not well visualized. Right ventricular systolic function is normal. Left Atrium: Left atrial size was mildly dilated. Right Atrium: Right atrial size was normal in size. Pericardium: There is no evidence of pericardial effusion. Presence of pericardial fat pad. Mitral Valve: The mitral valve is normal in structure. No evidence of mitral valve regurgitation. No evidence of mitral valve stenosis. Tricuspid Valve: The tricuspid valve is normal in structure. Tricuspid valve regurgitation is trivial. No evidence of tricuspid stenosis. Aortic Valve: The aortic valve has an indeterminant number of cusps. There is mild calcification of the aortic valve. There is moderate thickening of the aortic valve. Aortic valve regurgitation is trivial. Mild to moderate aortic valve sclerosis/calcification is present, without any evidence of aortic stenosis. Pulmonic Valve: The pulmonic valve was not well visualized. Pulmonic valve regurgitation is not visualized. Aorta: Aortic dilatation noted. There is borderline dilatation of the ascending aorta, measuring 38 mm. Venous: The inferior vena cava was not well visualized. IAS/Shunts: The atrial septum is grossly normal.  LEFT VENTRICLE PLAX 2D LVIDd:         5.60 cm LVIDs:         5.10 cm LV PW:         1.50 cm LV IVS:        1.40 cm LVOT diam:     2.10 cm LV SV:         61 LV SV Index:   24 LVOT Area:     3.46 cm  RIGHT VENTRICLE             IVC RV Basal diam:  3.50 cm     IVC diam: 2.30 cm RV Mid diam:    2.30 cm RV S prime:     15.90 cm/s TAPSE (M-mode): 2.6 cm LEFT ATRIUM             Index       RIGHT ATRIUM           Index LA diam:        4.20 cm 1.62 cm/m  RA Area:     21.00 cm LA Vol (A2C):   78.4 ml 30.23 ml/m RA Volume:   63.80 ml  24.60 ml/m LA Vol (A4C):    30.4 ml 11.72 ml/m LA Biplane Vol: 52.0 ml 20.05 ml/m  AORTIC VALVE LVOT Vmax:   87.90 cm/s LVOT Vmean:  54.300 cm/s LVOT VTI:    0.177 m  AORTA Ao Root diam: 4.00 cm Ao Asc diam:  3.80 cm MITRAL VALVE MV Area (PHT): 3.03 cm    SHUNTS MV Decel Time: 250 msec    Systemic VTI:  0.18 m MV E velocity: 71.30 cm/s  Systemic Diam: 2.10 cm MV A velocity: 95.60 cm/s MV E/A ratio:  0.75 Bridgette Christopher MD Electronically signed by Bridgette Christopher MD Signature Date/Time: 09/27/2020/2:42:43 PM    Final     Cardiac Studies      Patient Profile     69 y.o. male with hx of CAD, HTN, HLD chronic diastolic chf admitted with severe DOE   Assessment & Plan    1.    Severe shortness of breath with exertion: Patient has known COPD and also has known diastolic congestive heart failure.  He is diuresed around 4 L but yet still has severe shortness of breath with any sort of activity.  This may be due to critical coronary artery disease.  We will proceed we will proceed with heart catheterization today.  2.  Coronary artery disease: He has had PCI in the past.  His troponins are negative but his symptoms are consistent with recurrent coronary artery disease.  He has not improved with diuresis. We will schedule him for R and L heart catheterization later this afternoon. We discussed the risk, benefits, options of heart catheterization.  He understands and agrees to proceed.  3.  Morbid obesity: Needs to lose weight.      For questions or updates, please contact [place] Please consult [email or web address] for contact info under        Signed, Mertie Moores, MD  09/29/2020, 9:33 AM

## 2020-09-29 NOTE — Interval H&P Note (Signed)
Cath Lab Visit (complete for each Cath Lab visit)  Clinical Evaluation Leading to the Procedure:   ACS: No.  Non-ACS:    Anginal Classification: No Symptoms  Anti-ischemic medical therapy: Minimal Therapy (1 class of medications)  Non-Invasive Test Results: No non-invasive testing performed  Prior CABG: No previous CABG      History and Physical Interval Note:  09/29/2020 4:16 PM  [name]  has presented today for surgery, with the diagnosis of chest pain.  The various methods of treatment have been discussed with the patient and family. After consideration of risks, benefits and other options for treatment, the patient has consented to  Procedure(s): RIGHT/LEFT HEART CATH AND CORONARY ANGIOGRAPHY (N/A) as a surgical intervention.  The patient's history has been reviewed, patient examined, no change in status, stable for surgery.  I have reviewed the patient's chart and labs.  Questions were answered to the patient's satisfaction.     Quay Burow

## 2020-09-29 NOTE — Evaluation (Signed)
Occupational Therapy Evaluation Patient Details Name: Jerry [name] MRN: [id number] DOB: [birth year] Today's Date: 09/29/2020    History of Present Illness Pt is a 70 y/o male admitted secondary to chest pressure and SOB. Workup pending. PMH includes CAD, HTN, dCHF, COPD, anemia, R TKA, and tobacco use.   Clinical Impression   Pt ambulates with a cane and is assisted for LB dressing and supervised for showering. Pt can prepare a simple meal. He uses 2 L 02 at home at rest and 3L with exertion. Pt presents with decreased activity tolerance and impaired standing balance. He requires set up to moderate assistance for ADL. Pt currently on 3L 02 with Sp02 of 94% with sitting EOB and standing. Pt declined ambulation out of room. Will follow acutely. Recommending home with [place].     Follow Up Recommendations  No OT follow up    Equipment Recommendations  None recommended by OT    Recommendations for Other Services       Precautions / Restrictions Precautions Precautions: Fall Precaution Comments: watch O2      Mobility Bed Mobility Overal bed mobility: Modified Independent             General bed mobility comments: HOB up 30 degrees, use of rail and momentum    Transfers Overall transfer level: Needs assistance Equipment used: Rolling walker (2 wheeled) Transfers: Sit to/from Stand Sit to Stand: Min guard              Balance Overall balance assessment: Needs assistance   Sitting balance-Leahy Scale: Good     Standing balance support: Bilateral upper extremity supported;During functional activity Standing balance-Leahy Scale: Poor Standing balance comment: Reliant on BUE support                           ADL either performed or assessed with clinical judgement   ADL Overall ADL's : Needs assistance/impaired Eating/Feeding: Independent   Grooming: Wash/dry hands;Standing;Min guard   Upper Body Bathing: Set up;Sitting   Lower Body Bathing:  Minimal assistance;Sit to/from stand   Upper Body Dressing : Set up;Sitting   Lower Body Dressing: Moderate assistance;Sit to/from stand   Toilet Transfer: Min guard;Ambulation;RW;BSC   Toileting- Clothing Manipulation and Hygiene: Minimal assistance;Sit to/from stand       Functional mobility during ADLs: Min guard;Rolling walker       Vision Patient Visual Report: No change from baseline       Perception     Praxis      Pertinent Vitals/Pain Pain Assessment: Faces Faces Pain Scale: Hurts little more Pain Location: back--chronic Pain Descriptors / Indicators: Aching Pain Intervention(s): Monitored during session;Repositioned     Hand Dominance Right   Extremity/Trunk Assessment Upper Extremity Assessment Upper Extremity Assessment: Overall WFL for tasks assessed   Lower Extremity Assessment Lower Extremity Assessment: Defer to PT evaluation   Cervical / Trunk Assessment Cervical / Trunk Assessment: Kyphotic;Other exceptions Cervical / Trunk Exceptions: obese, chronic back pain   Communication Communication Communication: No difficulties   Cognition Arousal/Alertness: Awake/alert Behavior During Therapy: WFL for tasks assessed/performed Overall Cognitive Status: Within Functional Limits for tasks assessed                                     General Comments       Exercises     Shoulder Instructions      Home  Living Family/patient expects to be discharged to:: Private residence Living Arrangements: Children;Other relatives (son and son's family) Available Help at Discharge: Family;Available PRN/intermittently (daughter in law works 5 hours a day) Type of Home: House Home Access: Stairs to enter [employer] of Steps: 3 Entrance Stairs-Rails: Right;Left;Can reach both Home Layout: One level     Bathroom Shower/Tub: Teacher, early years/pre: Standard (BSC over)     Home Equipment: Bedside commode;Walker - 2  wheels;Cane - single point          Prior Functioning/Environment Level of Independence: Needs assistance  Gait / Transfers Assistance Needed: walked with a cane ADL's / Homemaking Assistance Needed: assisted with LB dressing, son supervised showers, assisted for heavy meals and housekeeping, but could prepare a simple meal   Comments: uses 2L 02 at home        OT Problem List: Impaired balance (sitting and/or standing);Decreased activity tolerance;Decreased knowledge of use of DME or AE;Cardiopulmonary status limiting activity;Obesity;Pain      OT Treatment/Interventions: Self-care/ADL training;Energy conservation;DME and/or AE instruction;Patient/family education;Balance training;Therapeutic activities    OT Goals(Current goals can be found in the care plan section) Acute Rehab OT Goals Patient Stated Goal: to figure out what is going on OT Goal Formulation: With patient Time For Goal Achievement: 10/13/20 Potential to Achieve Goals: Good ADL Goals Pt Will Perform Grooming: (P) with modified independence;standing Pt Will Transfer to Toilet: (P) with modified independence;ambulating;bedside commode Pt Will Perform Toileting - Clothing Manipulation and hygiene: (P) with modified independence;sit to/from stand Additional ADL Goal #1: (P) Pt will state at least 3 energy conservation strategies as instructed. Additional ADL Goal #2: (P) Pt will be knowledgeable in availability and use of sock aid.  OT Frequency: Min 2X/week   Barriers to D/C:            Co-evaluation              AM-PAC OT "6 Clicks" Daily Activity     Outcome Measure Help from another person eating meals?: None Help from another person taking care of personal grooming?: A Little Help from another person toileting, which includes using toliet, bedpan, or urinal?: A Little Help from another person bathing (including washing, rinsing, drying)?: A Little Help from another person to put on and taking off  regular upper body clothing?: None Help from another person to put on and taking off regular lower body clothing?: A Lot 6 Click Score: 19   End of Session Equipment Utilized During Treatment: Rolling walker  Activity Tolerance: Patient tolerated treatment well Patient left: in bed;with call bell/phone within reach  OT Visit Diagnosis: Other abnormalities of gait and mobility (R26.89);Pain;Muscle weakness (generalized) (M62.81);History of falling (Z91.81)                Time: 9292-4462 OT Time Calculation (min): 22 min Charges:  OT General Charges $OT Visit: 1 Visit OT Evaluation $OT Eval Moderate Complexity: 1 Mod  Malka So 09/29/2020, 1:00 PM  Nestor Lewandowsky, OTR/L Acute Rehabilitation Services Pager: [phone] Office: [phone]

## 2020-09-29 NOTE — Progress Notes (Signed)
Wrong patient

## 2020-09-29 NOTE — Progress Notes (Signed)
PROGRESS NOTE  [name]:[id number] DOB: 05/09/1951 DOA: 09/26/2020 PCP: Aletha Halim., PA-C   LOS: 1 day   Brief narrative:  Jerry [name] is a 70 y.o. male with medical history significant of iron deficiency anemia, anxiety, depression, chronic lower back pain, COPD, constipation, CAD, osteoarthritis with right knee replacement, GERD, history of unspecified headaches, hyperlipidemia, hypertension, obesity, osteosarcoma of the rib, peripheral vascular disease, history of pneumonia, prediabetes, tobacco abuse presented to hospital with multiple symptoms including chest discomfort dyspnea and palpitation.  Patient stated that his heart rate was in the 180s and I started developing lower extremity edema.  Is been complaining of some productive cough.  In the ED patient had stable vitals.  CBC showed no leukocytosis.  D-dimer was within normal range.  BNP was low.  Patient continued to have dyspnea diaphoresis dizziness palpitation and tachycardia on ambulation and was placed in observation.  During hospitalization, patient persisted to have tachycardia on ambulation with hypoxia and remained symptomatic.  Cardiology was consulted.  At this time, cardiology considering possible cardiac catheterization since patient was short of breath despite adequate diuresis.  Assessment/Plan:  Principal Problem:   Chest pain Active Problems:   CAD (coronary artery disease)   Essential hypertension   HLD (hyperlipidemia)   GERD (gastroesophageal reflux disease)   Anxiety   Chronic diastolic HF (heart failure) due to hypertrophic obstructive cardiomyopathy (HCC)   COPD GOLD II   Chronic respiratory failure with hypoxia and hypercapnia (HCC)   Class 3 obesity   Normocytic anemia   Pre-diabetes  Chest pain dyspnea on exertion, diaphoresis, dyspnea and chest pressure on ambulation With tachycardia on ambulation.  EKG showing PVCs. on aspirin metoprolol. 2D echocardiogram on 09/27/2020  showed LV ejection fraction of 55 to 60% with mild LVH and indeterminate diastolic parameters.  Cardiology on board in is on diuretics but despite that patient persists to have dyspnea and symptoms.  Cardiology considering cardiac catheterization.  Troponin negative so far. Patient with history of coronary artery disease and multiple risk factors.  Hypokalemia.  Improved.  Continue to replenish.  Coronary artery disease. on aspirin, atorvastatin and metoprolol.  Due to persistent dyspnea,Cardiology is planning for cardiac catheterization.    Essential hypertension Continue metoprolol, losartan.  Monitor blood pressure closely.  Hyperlipidemia Continue atorvastatin     GERD Continue Protonix    Anxiety/depression On Lexapro, trazodone   Acute on chronic diastolic heart failure due to hypertrophic obstructive cardiomyopathy /history of CAD. On losartan, IV diuretics..  Patient symptomatic on ambulation with diaphoresis dizziness, tachycardia and chest pressure.  Cardiology poor.    COPD GOLD II with chronic hypoxic and hypercapneic Respiratory failure  Continue 2 L of oxygen by nasal cannula. Continue bronchodilators.  No wheezing.  No indication for steroids.    Class 3 obesity Would benefit from weight loss as outpatient.    Normocytic anemia Monitor H&H.  Debility, deconditioning, poor exercise tolerance.  IV diuretics.  Hypoxic on ambulation. PT has seen the patient and recommend home health PT/vascular subsidy.  Patient with very poor exercise tolerance.  DVT prophylaxis: enoxaparin (LOVENOX) injection 40 mg Start: 09/27/20 0000   Code Status: Full code  Family Communication:  None today. I spoke with the patient's son on the phone yesterday.  Patient lives with his son at home  Status is: Inpatient  The patient is inpatient because: IV treatments appropriate due to intensity of illness or inability to take PO, Inpatient level of care appropriate due to severity  of  illness, dyspnea on exertion with hypoxia, possible cardiac catheterization need  Dispo: The patient is from: Home              Anticipated d/c is to: Home with PT/skilled nursing facility placement.               Anticipated d/c date is: 1 to 2 days              Patient currently is not medically stable to d/c.   Difficult to place patient No   Consultants:  Cardiology  Procedures:  2D echo  Anti-infectives:  . None  Anti-infectives (From admission, onward)   None     Subjective: Today, patient was seen and examined at bedside.  Patient is dyspneic and very symptomatic with dizziness on ambulation.  Denies any chest pain at rest.  Objective: Vitals:   09/29/20 0434 09/29/20 0814  BP: (!) 110/49 (!) 122/54  Pulse: 63 77  Resp: 19 14  Temp: 97.8 F (36.6 C) 98 F (36.7 C)  SpO2: 94%     Intake/Output Summary (Last 24 hours) at 09/29/2020 1022 Last data filed at 09/29/2020 0550 Gross per 24 hour  Intake 540 ml  Output 1875 ml  Net -1335 ml   Filed Weights   09/27/20 1515 09/28/20 0356  Weight: 136.1 kg 133.3 kg   Body mass index is 38.77 kg/m.   Physical Exam:  GENERAL: Patient is alert awake and oriented. Not in obvious distress.  On nasal cannula oxygen at 3 L/min, appears chronically ill, mildly anxious, obese HENT: No scleral pallor or icterus. Pupils equally reactive to light. Oral mucosa is moist NECK: is supple, no gross swelling noted. CHEST:   Diminished breath sounds bilaterally.   CVS: S1 and S2 heard, no murmur. Regular rate and rhythm.  ABDOMEN: Soft, non-tender, bowel sounds are present. EXTREMITIES: Bilateral lower extremity edema noted CNS: Cranial nerves are intact. No focal motor deficits. SKIN: warm and dry without rashes.  Data Review: I have personally reviewed the following laboratory data and studies,  CBC: Recent Labs  Lab 09/26/20 1743 09/29/20 0137  WBC 7.2 7.3  HGB 11.1* 10.6*  HCT 35.7* 33.6*  MCV 90.4 89.4  PLT  257 761   Basic Metabolic Panel: Recent Labs  Lab 09/26/20 1743 09/28/20 0039 09/29/20 0121  NA 138 138 138  K 4.1 3.2* 3.6  CL 94* 92* 94*  CO2 36* 34* 32  GLUCOSE 123* 124* 114*  BUN 9 11 10   CREATININE 0.97 0.95 0.98  CALCIUM 9.5 9.0 9.1  MG  --   --  1.9  PHOS  --   --  4.0   Liver Function Tests: Recent Labs  Lab 09/29/20 0121  AST 28  ALT 16  ALKPHOS 66  BILITOT 0.7  PROT 6.9  ALBUMIN 3.5   No results for input(s): LIPASE, AMYLASE in the last 168 hours. No results for input(s): AMMONIA in the last 168 hours. Cardiac Enzymes: No results for input(s): CKTOTAL, CKMB, CKMBINDEX, TROPONINI in the last 168 hours. BNP (last 3 results) Recent Labs    09/26/20 2030  BNP 53.2    ProBNP (last 3 results) No results for input(s): PROBNP in the last 8760 hours.  CBG: Recent Labs  Lab 09/27/20 2138 09/28/20 0643 09/28/20 1055 09/28/20 2140 09/29/20 0540  GLUCAP 135* 112* 117* 109* 116*   Recent Results (from the past 240 hour(s))  Resp Panel by RT-PCR (Flu A&B, Covid) Nasopharyngeal Swab  Status: None   Collection Time: 09/26/20  8:18 PM   Specimen: Nasopharyngeal Swab; Nasopharyngeal(NP) swabs in vial transport medium  Result Value Ref Range Status   SARS Coronavirus 2 by RT PCR NEGATIVE NEGATIVE Final    Comment: (NOTE) SARS-CoV-2 target nucleic acids are NOT DETECTED.  The SARS-CoV-2 RNA is generally detectable in upper respiratory specimens during the acute phase of infection. The lowest concentration of SARS-CoV-2 viral copies this assay can detect is 138 copies/mL. A negative result does not preclude SARS-Cov-2 infection and should not be used as the sole basis for treatment or other patient management decisions. A negative result may occur with  improper specimen collection/handling, submission of specimen other than nasopharyngeal swab, presence of viral mutation(s) within the areas targeted by this assay, and inadequate number of  viral copies(<138 copies/mL). A negative result must be combined with clinical observations, patient history, and epidemiological information. The expected result is Negative.  Fact Sheet for Patients:  EntrepreneurPulse.com.au  Fact Sheet for Healthcare Providers:  IncredibleEmployment.be  This test is no t yet approved or cleared by the [place] FDA and  has been authorized for detection and/or diagnosis of SARS-CoV-2 by FDA under an Emergency Use Authorization (EUA). This EUA will remain  in effect (meaning this test can be used) for the duration of the COVID-19 declaration under Section 564(b)(1) of the Act, 21 U.S.C.section 360bbb-3(b)(1), unless the authorization is terminated  or revoked sooner.       Influenza A by PCR NEGATIVE NEGATIVE Final   Influenza B by PCR NEGATIVE NEGATIVE Final    Comment: (NOTE) The Xpert Xpress SARS-CoV-2/FLU/RSV plus assay is intended as an aid in the diagnosis of influenza from Nasopharyngeal swab specimens and should not be used as a sole basis for treatment. Nasal washings and aspirates are unacceptable for Xpert Xpress SARS-CoV-2/FLU/RSV testing.  Fact Sheet for Patients: EntrepreneurPulse.com.au  Fact Sheet for Healthcare Providers: IncredibleEmployment.be  This test is not yet approved or cleared by the [place] FDA and has been authorized for detection and/or diagnosis of SARS-CoV-2 by FDA under an Emergency Use Authorization (EUA). This EUA will remain in effect (meaning this test can be used) for the duration of the COVID-19 declaration under Section 564(b)(1) of the Act, 21 U.S.C. section 360bbb-3(b)(1), unless the authorization is terminated or revoked.  Performed at [facility], [place] [address], [place], [place] [address]   MRSA PCR Screening     Status: None   Collection Time: 09/27/20  5:21 PM   Specimen: Nasopharyngeal  Result  Value Ref Range Status   MRSA by PCR NEGATIVE NEGATIVE Final    Comment:        The GeneXpert MRSA Assay (FDA approved for NASAL specimens only), is one component of a comprehensive MRSA colonization surveillance program. It is not intended to diagnose MRSA infection nor to guide or monitor treatment for MRSA infections. Performed at [facility], [place] [address] [place],  [address]      Studies: ECHOCARDIOGRAM COMPLETE  Result Date: 09/27/2020    ECHOCARDIOGRAM REPORT   Patient Name:   SOLACE WENDORFF Date of Exam: 09/27/2020 Medical Rec #:  [id number]        Height:       74.0 in Accession #:    [id number]       Weight:       303.0 lb Date of Birth:  02-10-1951       BSA:  2.593 m Patient Age:    73 years         BP:           122/73 mmHg Patient Gender: M                HR:           72 bpm. Exam Location:  Inpatient Procedure: 2D Echo, Cardiac Doppler, Color Doppler and Intracardiac            Opacification Agent Indications:    I50.33 Acute on chronic diastolic (congestive) heart failure  History:        Patient has prior history of Echocardiogram examinations, most                 recent 08/26/2019. CHF, Previous Myocardial Infarction and CAD,                 COPD, Signs/Symptoms:Dyspnea; Risk Factors:Hypertension,                 Dyslipidemia, Current Smoker and GERD.  Sonographer:    Jonelle Sidle Dance Referring Phys: [id number] [place]  1. Left ventricular ejection fraction, by estimation, is 55 to 60%. The left ventricle has normal function. The left ventricle demonstrates regional wall motion abnormalities (see scoring diagram/findings for description). There is mild concentric left ventricular hypertrophy. Left ventricular diastolic parameters are indeterminate. There is hypokinesis of the left ventricular, basal inferior wall.  2. Right ventricular systolic function is normal. The right ventricular size is normal.  3. Left atrial size was mildly  dilated.  4. The mitral valve is normal in structure. No evidence of mitral valve regurgitation. No evidence of mitral stenosis.  5. The aortic valve has an indeterminant number of cusps. There is mild calcification of the aortic valve. There is moderate thickening of the aortic valve. Aortic valve regurgitation is trivial. Mild to moderate aortic valve sclerosis/calcification is present, without any evidence of aortic stenosis.  6. Aortic dilatation noted. There is borderline dilatation of the ascending aorta, measuring 38 mm. Comparison(s): No significant change from prior study. FINDINGS  Left Ventricle: Left ventricular ejection fraction, by estimation, is 55 to 60%. The left ventricle has normal function. The left ventricle demonstrates regional wall motion abnormalities. Definity contrast agent was given IV to delineate the left ventricular endocardial borders. The left ventricular internal cavity size was normal in size. There is mild concentric left ventricular hypertrophy. Left ventricular diastolic parameters are indeterminate. Right Ventricle: The right ventricular size is normal. Right vetricular wall thickness was not well visualized. Right ventricular systolic function is normal. Left Atrium: Left atrial size was mildly dilated. Right Atrium: Right atrial size was normal in size. Pericardium: There is no evidence of pericardial effusion. Presence of pericardial fat pad. Mitral Valve: The mitral valve is normal in structure. No evidence of mitral valve regurgitation. No evidence of mitral valve stenosis. Tricuspid Valve: The tricuspid valve is normal in structure. Tricuspid valve regurgitation is trivial. No evidence of tricuspid stenosis. Aortic Valve: The aortic valve has an indeterminant number of cusps. There is mild calcification of the aortic valve. There is moderate thickening of the aortic valve. Aortic valve regurgitation is trivial. Mild to moderate aortic valve sclerosis/calcification is  present, without any evidence of aortic stenosis. Pulmonic Valve: The pulmonic valve was not well visualized. Pulmonic valve regurgitation is not visualized. Aorta: Aortic dilatation noted. There is borderline dilatation of the ascending aorta, measuring 38 mm. Venous: The inferior vena cava was  not well visualized. IAS/Shunts: The atrial septum is grossly normal.  LEFT VENTRICLE PLAX 2D LVIDd:         5.60 cm LVIDs:         5.10 cm LV PW:         1.50 cm LV IVS:        1.40 cm LVOT diam:     2.10 cm LV SV:         61 LV SV Index:   24 LVOT Area:     3.46 cm  RIGHT VENTRICLE             IVC RV Basal diam:  3.50 cm     IVC diam: 2.30 cm RV Mid diam:    2.30 cm RV S prime:     15.90 cm/s TAPSE (M-mode): 2.6 cm LEFT ATRIUM             Index       RIGHT ATRIUM           Index LA diam:        4.20 cm 1.62 cm/m  RA Area:     21.00 cm LA Vol (A2C):   78.4 ml 30.23 ml/m RA Volume:   63.80 ml  24.60 ml/m LA Vol (A4C):   30.4 ml 11.72 ml/m LA Biplane Vol: 52.0 ml 20.05 ml/m  AORTIC VALVE LVOT Vmax:   87.90 cm/s LVOT Vmean:  54.300 cm/s LVOT VTI:    0.177 m  AORTA Ao Root diam: 4.00 cm Ao Asc diam:  3.80 cm MITRAL VALVE MV Area (PHT): 3.03 cm    SHUNTS MV Decel Time: 250 msec    Systemic VTI:  0.18 m MV E velocity: 71.30 cm/s  Systemic Diam: 2.10 cm MV A velocity: 95.60 cm/s MV E/A ratio:  0.75 Buford Dresser MD Electronically signed by Buford Dresser MD Signature Date/Time: 09/27/2020/2:42:43 PM    Final       Flora Lipps, MD  [facility] 09/29/2020  If 7PM-7AM, please contact night-coverage

## 2020-09-30 ENCOUNTER — Inpatient Hospital Stay (HOSPITAL_COMMUNITY): Payer: Medicare HMO

## 2020-09-30 ENCOUNTER — Encounter (HOSPITAL_COMMUNITY): Payer: Self-pay | Admitting: Cardiovascular Disease

## 2020-09-30 DIAGNOSIS — R079 Chest pain, unspecified: Secondary | ICD-10-CM

## 2020-09-30 DIAGNOSIS — I421 Obstructive hypertrophic cardiomyopathy: Secondary | ICD-10-CM

## 2020-09-30 DIAGNOSIS — M7989 Other specified soft tissue disorders: Secondary | ICD-10-CM

## 2020-09-30 DIAGNOSIS — M79604 Pain in right leg: Secondary | ICD-10-CM

## 2020-09-30 DIAGNOSIS — M79605 Pain in left leg: Secondary | ICD-10-CM

## 2020-09-30 DIAGNOSIS — I5032 Chronic diastolic (congestive) heart failure: Secondary | ICD-10-CM | POA: Diagnosis not present

## 2020-09-30 DIAGNOSIS — J9612 Chronic respiratory failure with hypercapnia: Secondary | ICD-10-CM

## 2020-09-30 DIAGNOSIS — J9611 Chronic respiratory failure with hypoxia: Secondary | ICD-10-CM | POA: Diagnosis not present

## 2020-09-30 LAB — BASIC METABOLIC PANEL
Anion gap: 12 (ref 5–15)
BUN: 10 mg/dL (ref 8–23)
CO2: 32 mmol/L (ref 22–32)
Calcium: 8.9 mg/dL (ref 8.9–10.3)
Chloride: 95 mmol/L — ABNORMAL LOW (ref 98–111)
Creatinine, Ser: 0.92 mg/dL (ref 0.61–1.24)
GFR, Estimated: >60 mL/min (ref >60)
Glucose, Bld: 110 mg/dL — ABNORMAL HIGH (ref 70–99)
Potassium: 4.2 mmol/L (ref 3.5–5.1)
Sodium: 139 mmol/L (ref 135–145)

## 2020-09-30 LAB — EXPECTORATED SPUTUM ASSESSMENT W GRAM STAIN, RFLX TO RESP C

## 2020-09-30 LAB — CBC
HCT: 33 % — ABNORMAL LOW (ref 39.0–52.0)
Hemoglobin: 10.4 g/dL — ABNORMAL LOW (ref 13.0–17.0)
MCH: 28.3 pg (ref 26.0–34.0)
MCHC: 31.5 g/dL (ref 30.0–36.0)
MCV: 89.7 fL (ref 80.0–100.0)
Platelets: 226 10*3/uL (ref 150–400)
RBC: 3.68 MIL/uL — ABNORMAL LOW (ref 4.22–5.81)
RDW: 14.7 % (ref 11.5–15.5)
WBC: 6.9 10*3/uL (ref 4.0–10.5)
nRBC: 0 % (ref 0.0–0.2)

## 2020-09-30 LAB — PROCALCITONIN: Procalcitonin: <0.1 ng/mL

## 2020-09-30 LAB — GLUCOSE, CAPILLARY
Glucose-Capillary: 113 mg/dL — ABNORMAL HIGH (ref 70–99)
Glucose-Capillary: 131 mg/dL — ABNORMAL HIGH (ref 70–99)
Glucose-Capillary: 131 mg/dL — ABNORMAL HIGH (ref 70–99)
Glucose-Capillary: 107 mg/dL — ABNORMAL HIGH (ref 70–99)

## 2020-09-30 LAB — STREP PNEUMONIAE URINARY ANTIGEN: Strep Pneumo Urinary Antigen: NEGATIVE

## 2020-09-30 NOTE — Plan of Care (Signed)
  Problem: Education: Goal: Knowledge of General Education information will improve Description: Including pain rating scale, medication(s)/side effects and non-pharmacologic comfort measures Outcome: Progressing   Problem: Health Behavior/Discharge Planning: Goal: Ability to manage health-related needs will improve Outcome: Progressing   Problem: Clinical Measurements: Goal: Ability to maintain clinical measurements within normal limits will improve Outcome: Progressing Goal: Will remain free from infection Outcome: Progressing Goal: Respiratory complications will improve Outcome: Progressing Goal: Cardiovascular complication will be avoided Outcome: Progressing   Problem: Elimination: Goal: Will not experience complications related to urinary retention Outcome: Progressing

## 2020-09-30 NOTE — Progress Notes (Signed)
TR BAND REMOVAL  LOCATION:    right radial  DEFLATED PER PROTOCOL:    Yes.    TIME BAND OFF / DRESSING APPLIED:    20:30 (09/29/20)   SITE UPON ARRIVAL:    Level 0  SITE AFTER BAND REMOVAL:    Level 0  CIRCULATION SENSATION AND MOVEMENT:    Within Normal Limits   Yes.    COMMENTS:   Post TR band instructions given. Pt tolerated well.

## 2020-09-30 NOTE — Progress Notes (Signed)
PROGRESS NOTE  Jerry [name] WER:[id number] DOB: 19-Aug-1950 DOA: 09/26/2020 PCP: Aletha Halim., PA-C   LOS: 2 days   Brief narrative:  Jerry [name] is a 70 y.o. male with medical history significant of iron deficiency anemia, anxiety, depression, chronic lower back pain, COPD, constipation, CAD, osteoarthritis with right knee replacement, GERD, history of unspecified headaches, hyperlipidemia, hypertension, obesity, osteosarcoma of the rib, peripheral vascular disease, history of pneumonia, prediabetes, tobacco abuse presented to hospital with multiple symptoms including chest discomfort dyspnea and palpitation.  Patient stated that his heart rate was in the 180s and  started developing lower extremity edema.  Is been complaining of some productive cough.  In the ED, patient had stable vitals.  CBC showed no leukocytosis.  D-dimer was within normal range.  BNP was low.  Patient continued to have dyspnea, diaphoresis, dizziness, palpitation and tachycardia on ambulation and was placed in observation.  During hospitalization, patient persisted to have tachycardia on ambulation with hypoxia and remained symptomatic.  Cardiology was consulted.  Cardiology was consulted and patient underwent cardiac catheterization on 07/29/2021.   Assessment/Plan:  Principal Problem:   Chest pain Active Problems:   CAD (coronary artery disease)   Essential hypertension   HLD (hyperlipidemia)   GERD (gastroesophageal reflux disease)   Anxiety   Chronic diastolic HF (heart failure) due to hypertrophic obstructive cardiomyopathy (HCC)   COPD GOLD II   Chronic respiratory failure with hypoxia and hypercapnia (HCC)   Class 3 obesity   Normocytic anemia   Pre-diabetes  Chest pain dyspnea on exertion, diaphoresis, dyspnea and chest pressure on ambulation Patient still feels symptomatic especially on ambulation with hypoxia dizziness diaphoresis on ambulation.  Patient is euvolemic and adequately  diuresed but still has symptoms.  Patient follows up with Dr. Melvyn Novas pulmonary as outpatient.  Patient underwent catheterization on 09/29/20 with mention of patent stent.  Cardiology does not believe that patient's dyspnea and symptoms are related to cardiac condition.  Will get pulmonary evaluation and opinion.  EKG showing PVCs. on aspirin metoprolol. 2D echocardiogram on 09/27/2020 showed LV ejection fraction of 55 to 60% with mild LVH and indeterminate diastolic parameters. Troponin negative.  Patient is still on IV diuretics.  We will also get pulmonary opinion.  Hypokalemia.  Improved.  Replenish as necessary  Coronary artery disease. on aspirin, atorvastatin and metoprolol.  As per psychiatry catheterization on 09/29/2020 with patent LAD and 95% stenosis of first marginal.    Essential hypertension Continue metoprolol, losartan.    Hyperlipidemia Continue atorvastatin     GERD Continue Protonix    Anxiety/depression On Lexapro, trazodone   Acute on chronic diastolic heart failure due to hypertrophic obstructive cardiomyopathy /history of CAD. On losartan, IV diuretics..  Patient still symptomatic on ambulation with diaphoresis dizziness, tachycardia and chest pressure and hypoxia.Marland Kitchen  Poor exercise tolerance.  Will get pulmonary opinion.  Physical therapy has seen the patient and recommend home health PT/skilled facility    COPD GOLD II with chronic hypoxic and hypercapneic Respiratory failure  Continue 2 L of oxygen by nasal cannula. Continue bronchodilators.  No wheezing.  No indication for steroids.    Class 3 obesity Would benefit from weight loss as outpatient.    Normocytic anemia Monitor H&H.  Debility, deconditioning, poor exercise tolerance.  Currently on IV diuretics.  Hypoxic on ambulation. PT has seen the patient and recommend home health PT/nursing facility  DVT prophylaxis:    Code Status: Full code  Family Communication:  None today.   Status is:  Inpatient  The patient is inpatient because: IV treatments appropriate due to intensity of illness or inability to take PO, Inpatient level of care appropriate due to severity of illness, dyspnea on exertion with hypoxia, possible cardiac catheterization need  Dispo: The patient is from: Home              Anticipated d/c is to: Home with PT/skilled nursing facility placement.               Anticipated d/c date is: 1 to 2 days              Patient currently is not medically stable to d/c.   Difficult to place patient No   Consultants:  Cardiology  Pulmonary  Procedures:  2D echo  Anti-infectives:  . None  Anti-infectives (From admission, onward)   None     Subjective: Today, patient was seen and examined at bedside.  Patient feels okay at rest but is very dyspneic diaphoretic dizzy on ambulation with hypoxia on ambulation.   Objective: Vitals:   09/30/20 0600 09/30/20 0751  BP: 128/61 108/76  Pulse: 65 94  Resp: 18   Temp:  98.3 F (36.8 C)  SpO2: 95% 95%    Intake/Output Summary (Last 24 hours) at 09/30/2020 0944 Last data filed at 09/30/2020 0830 Gross per 24 hour  Intake 821.29 ml  Output 3150 ml  Net -2328.71 ml   Filed Weights   09/29/20 0814 09/29/20 1209 09/30/20 0346  Weight: 133.4 kg 133.8 kg 132.7 kg   Body mass index is 38.6 kg/m.   Physical Exam:  General: Obese built, not in obvious distress, on nasal cannula oxygen, mildly anxious HENT:   No scleral pallor or icterus noted. Oral mucosa is moist.  Chest:   Diminished breath sounds bilaterally. No crackles or wheezes.  CVS: S1 &S2 heard. No murmur.  Regular rate and rhythm. Abdomen: Soft, nontender, nondistended.  Bowel sounds are heard.   Extremities: No cyanosis, clubbing trace edema peripheral pulses are palpable. Psych: Alert, awake and oriented, normal mood CNS:  No cranial nerve deficits.  Power equal in all extremities.   Skin: Warm and dry.  No rashes noted.  Data Review: I have  personally reviewed the following laboratory data and studies,  CBC: Recent Labs  Lab 09/26/20 1743 09/29/20 0137 09/29/20 1653 09/29/20 1654 09/29/20 1702 09/30/20 0016  WBC 7.2 7.3  --   --   --  6.9  HGB 11.1* 10.6* 11.9* 11.9* 11.2* 10.4*  HCT 35.7* 33.6* 35.0* 35.0* 33.0* 33.0*  MCV 90.4 89.4  --   --   --  89.7  PLT 257 250  --   --   --  449   Basic Metabolic Panel: Recent Labs  Lab 09/26/20 1743 09/28/20 0039 09/29/20 0121 09/29/20 1653 09/29/20 1654 09/29/20 1702 09/30/20 0016  NA 138 138 138 138 139 137 139  K 4.1 3.2* 3.6 4.1 4.1 4.0 4.2  CL 94* 92* 94*  --   --   --  95*  CO2 36* 34* 32  --   --   --  32  GLUCOSE 123* 124* 114*  --   --   --  110*  BUN 9 11 10   --   --   --  10  CREATININE 0.97 0.95 0.98  --   --   --  0.92  CALCIUM 9.5 9.0 9.1  --   --   --  8.9  MG  --   --  1.9  --   --   --   --   PHOS  --   --  4.0  --   --   --   --    Liver Function Tests: Recent Labs  Lab 09/29/20 0121  AST 28  ALT 16  ALKPHOS 66  BILITOT 0.7  PROT 6.9  ALBUMIN 3.5   No results for input(s): LIPASE, AMYLASE in the last 168 hours. No results for input(s): AMMONIA in the last 168 hours. Cardiac Enzymes: No results for input(s): CKTOTAL, CKMB, CKMBINDEX, TROPONINI in the last 168 hours. BNP (last 3 results) Recent Labs    09/26/20 2030  BNP 53.2    ProBNP (last 3 results) No results for input(s): PROBNP in the last 8760 hours.  CBG: Recent Labs  Lab 09/29/20 0540 09/29/20 1112 09/29/20 1555 09/29/20 2111 09/30/20 0608  GLUCAP 116* 163* 127* 181* 113*   Recent Results (from the past 240 hour(s))  Resp Panel by RT-PCR (Flu A&B, Covid) Nasopharyngeal Swab     Status: None   Collection Time: 09/26/20  8:18 PM   Specimen: Nasopharyngeal Swab; Nasopharyngeal(NP) swabs in vial transport medium  Result Value Ref Range Status   SARS Coronavirus 2 by RT PCR NEGATIVE NEGATIVE Final    Comment: (NOTE) SARS-CoV-2 target nucleic acids are NOT  DETECTED.  The SARS-CoV-2 RNA is generally detectable in upper respiratory specimens during the acute phase of infection. The lowest concentration of SARS-CoV-2 viral copies this assay can detect is 138 copies/mL. A negative result does not preclude SARS-Cov-2 infection and should not be used as the sole basis for treatment or other patient management decisions. A negative result may occur with  improper specimen collection/handling, submission of specimen other than nasopharyngeal swab, presence of viral mutation(s) within the areas targeted by this assay, and inadequate number of viral copies(<138 copies/mL). A negative result must be combined with clinical observations, patient history, and epidemiological information. The expected result is Negative.  Fact Sheet for Patients:  EntrepreneurPulse.com.au  Fact Sheet for Healthcare Providers:  IncredibleEmployment.be  This test is no t yet approved or cleared by the [place] FDA and  has been authorized for detection and/or diagnosis of SARS-CoV-2 by FDA under an Emergency Use Authorization (EUA). This EUA will remain  in effect (meaning this test can be used) for the duration of the COVID-19 declaration under Section 564(b)(1) of the Act, 21 U.S.C.section 360bbb-3(b)(1), unless the authorization is terminated  or revoked sooner.       Influenza A by PCR NEGATIVE NEGATIVE Final   Influenza B by PCR NEGATIVE NEGATIVE Final    Comment: (NOTE) The Xpert Xpress SARS-CoV-2/FLU/RSV plus assay is intended as an aid in the diagnosis of influenza from Nasopharyngeal swab specimens and should not be used as a sole basis for treatment. Nasal washings and aspirates are unacceptable for Xpert Xpress SARS-CoV-2/FLU/RSV testing.  Fact Sheet for Patients: EntrepreneurPulse.com.au  Fact Sheet for Healthcare Providers: IncredibleEmployment.be  This test is not yet  approved or cleared by the [place] FDA and has been authorized for detection and/or diagnosis of SARS-CoV-2 by FDA under an Emergency Use Authorization (EUA). This EUA will remain in effect (meaning this test can be used) for the duration of the COVID-19 declaration under Section 564(b)(1) of the Act, 21 U.S.C. section 360bbb-3(b)(1), unless the authorization is terminated or revoked.  Performed at [facility], [place] [address] [place], [place] [address]   MRSA PCR Screening     Status:  None   Collection Time: 09/27/20  5:21 PM   Specimen: Nasopharyngeal  Result Value Ref Range Status   MRSA by PCR NEGATIVE NEGATIVE Final    Comment:        The GeneXpert MRSA Assay (FDA approved for NASAL specimens only), is one component of a comprehensive MRSA colonization surveillance program. It is not intended to diagnose MRSA infection nor to guide or monitor treatment for MRSA infections. Performed at [facility], [place] [address], [place], [place] [address]      Studies: CARDIAC CATHETERIZATION  Result Date: 09/29/2020  1st Mrg lesion is 95% stenosed.  Previously placed Mid LAD stent (unknown type) is widely patent.  Jerry [name] is a 70 y.o. male  [id number] LOCATION:  FACILITY: [facility] PHYSICIAN: Quay Burow, M.D. October 27, 1950 DATE OF PROCEDURE:  09/29/2020 DATE OF DISCHARGE: CARDIAC CATHETERIZATION History obtained from chart review.70 y.o. male with hx of CAD, HTN, HLD chronic diastolic chf admitted with severe DOE .  His enzymes were negative.  His EF was normal by 2D echo.  He was referred for right left heart cath to define his anatomy and physiology.   Mr. Guile has widely patent LAD stent with no obstructive disease otherwise.  He has normal filling pressures.  I do not think that his shortness of breath is ischemically mediated.  He appears to be euvolemic and well diuresed.  The radial sheath was removed and a TR band was placed on the right wrist to achieve  patent hemostasis.  A Mynx device was successfully deployed in the right common femoral vein.  The patient left lab in stable condition. Quay Burow. MD, [facility] 09/29/2020 5:14 PM      Flora Lipps, MD  [facility] 09/30/2020  If 7PM-7AM, please contact night-coverage

## 2020-09-30 NOTE — Progress Notes (Signed)
Physical Therapy Treatment Patient Details Name: Jerry [name] MRN: [id number] DOB: Mar 19, 1951 Today's Date: 09/30/2020    History of Present Illness Pt is a 70 y/o male admitted secondary to chest pressure and SOB. s/p R/L heart cath on 2/24, showing patent LAD stent, 95% stenosis of 1st marginal. Per hospitalist note on 2/25, "Cardiology does not believe that patient's dyspnea and symptoms are related to cardiac condition". CXR shows bibasilar interstitial opacities superimposed on chronic lung changes, may reflect pulmonary edema or infection. LE [place] negative for DVT, pt unable to tolerate CT angio to r/o PE. Workup for HF exacerbation with fluid overload, COPD exacerbation. PMH includes CAD, HTN, dCHF, COPD, anemia, R TKA, chest wall osteosarcoma 2016, and tobacco use.    PT Comments    Pt agreeable to OOB mobility today. Pt ambulatory in room with use of RW and close guard for safety. PT encouraged activity pacing given pt's tendency towards dyspnea, diaphoresis, O2 desaturation, and dizziness with activity. Pt maintained SpO2 86% and greater on 3LO2 during mobility with standing rest breaks every 5-10 ft, performing pursed lip breathing with PT cuing. Pt with 1 period of dizziness during gait after 30 ft ambulation total, but passed with breathing technique and seated rest break. PT to continue to follow acutely, PT feels pt is at HHPT level with assist of family.     Follow Up Recommendations  Home health PT;Supervision for mobility/OOB     Equipment Recommendations  None recommended by PT    Recommendations for Other Services       Precautions / Restrictions Precautions Precautions: Fall Precaution Comments: watch O2 Restrictions Weight Bearing Restrictions: No    Mobility  Bed Mobility Overal bed mobility: Needs Assistance Bed Mobility: Supine to Sit     Supine to sit: Min guard     General bed mobility comments: for safety, HOB elevated with increased time and use  of bedrail.    Transfers Overall transfer level: Needs assistance Equipment used: Rolling walker (2 wheeled) Transfers: Sit to/from Stand Sit to Stand: Min guard         General transfer comment: for safety, verbal cuing for hand placement when rising/sitting. STS x2, from EOB and recliner.  Ambulation/Gait Ambulation/Gait assistance: Min guard Gait Distance (Feet): 20 Feet (x2) Assistive device: Rolling walker (2 wheeled) Gait Pattern/deviations: Decreased stride length;Step-through pattern Gait velocity: decr   General Gait Details: Min guard for safety, verbal cuing for upright posture, placement in RW, activity pacing. PT had pt stop every 5-10 ft and perform pursed lip breathing, assess for dizziness/chest pressure. SpO2 87% and greater on 3LO2. Seated rest break at 20 ft ambulation.   Stairs             Wheelchair Mobility    Modified Rankin (Stroke Patients Only)       Balance Overall balance assessment: Needs assistance   Sitting balance-Leahy Scale: Good     Standing balance support: Bilateral upper extremity supported;During functional activity Standing balance-Leahy Scale: Poor Standing balance comment: Reliant on BUE support                            Cognition Arousal/Alertness: Awake/alert Behavior During Therapy: WFL for tasks assessed/performed Overall Cognitive Status: Within Functional Limits for tasks assessed  Exercises      General Comments General comments (skin integrity, edema, etc.): R radial and R femoral artery sites from heart cath were clean, dry, and intact pre-, mid-, and post-session.      Pertinent Vitals/Pain Pain Assessment: Faces Faces Pain Scale: Hurts little more Pain Location: back--chronic Pain Descriptors / Indicators: Aching;Sore Pain Intervention(s): Limited activity within patient's tolerance;Monitored during session;Repositioned    Home  Living                      Prior Function            PT Goals (current goals can now be found in the care plan section) Acute Rehab PT Goals Patient Stated Goal: go home when better PT Goal Formulation: With patient Time For Goal Achievement: 10/12/20 Potential to Achieve Goals: Good Progress towards PT goals: Progressing toward goals    Frequency    Min 3X/week      PT Plan Current plan remains appropriate    Co-evaluation              AM-PAC PT "6 Clicks" Mobility   Outcome Measure  Help needed turning from your back to your side while in a flat bed without using bedrails?: A Little Help needed moving from lying on your back to sitting on the side of a flat bed without using bedrails?: A Little Help needed moving to and from a bed to a chair (including a wheelchair)?: A Little Help needed standing up from a chair using your arms (e.g., wheelchair or bedside chair)?: A Little Help needed to walk in hospital room?: A Little Help needed climbing 3-5 steps with a railing? : A Lot 6 Click Score: 17    End of Session   Activity Tolerance: Patient tolerated treatment well;Patient limited by fatigue Patient left: with call bell/phone within reach;in chair;with nursing/sitter in room (pt verbalizes he will not get up without RN staff present) Nurse Communication: Mobility status;Other (comment) (elevated HR, chest pressure, and oxygen sats) PT Visit Diagnosis: Unsteadiness on feet (R26.81);Muscle weakness (generalized) (M62.81)     Time: 2297-9892 PT Time Calculation (min) (ACUTE ONLY): 26 min  Charges:  $Gait Training: 8-22 mins $Therapeutic Activity: 8-22 mins                    Stacie Glaze, PT Acute Rehabilitation Services Pager [phone]  Office [phone]   Louis Matte 09/30/2020, 3:27 PM
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
## 2020-09-30 NOTE — TOC Initial Note (Addendum)
Transition of Care [facility]) - Initial/Assessment Note    Patient Details  Name: Jerry [name] MRN: [id number] Date of Birth: [birth year]  [facility]) CM/SW Contact:    Zenon Mayo, RN Phone Number: 09/30/2020, 2:39 PM  Clinical Narrative:                 NCM spoke with patient , he states he uses a cane and a walker at home. He lives with son and his wife and kids. He uses home oxygen 2 liters with [employer].  He has a scale at home and weighs himself daily.  NCM offered choice to patient for HHPT,HHOT, he states he does not have a preference.  NCM made referral to [place] with [facility].  Awaiting call back.  Per [place], she states she can take referral for HHPT,HHOT. Soc will begin 24 to 48 hrs post dc.     Expected Discharge Plan: [place] Barriers to Discharge: Continued Medical Work up   Patient Goals and CMS Choice Patient states their goals for this hospitalization and ongoing recovery are:: get better CMS Medicare.gov Compare Post Acute Care list provided to:: Patient Choice offered to / list presented to : Patient  Expected Discharge Plan and Services Expected Discharge Plan: [place]   Discharge Planning Services: CM Consult Post Acute Care Choice: Home Health                     DME Agency: NA       HH Arranged: PT,OT [place] Agency: [facility] (formerly [employer]) Date [place]: 09/30/20 Time [place]: 1437 Representative spoke with at [place]: [place] Arrangements/Services   Lives with:: Adult Children,Relatives Patient language and need for interpreter reviewed:: Yes Do you feel safe going back to the place where you live?: Yes      Need for Family Participation in Patient Care: Yes (Comment) Care giver support system in place?: Yes (comment) Current home services: DME (has cane, walker, home oxygen with [employer]) Criminal Activity/Legal Involvement Pertinent  to Current Situation/Hospitalization: No - Comment as needed  Activities of Daily Living      Permission Sought/Granted                  Emotional Assessment   Attitude/Demeanor/Rapport: Engaged Affect (typically observed): Appropriate Orientation: : Oriented to Self,Oriented to Place,Oriented to  Time,Oriented to Situation Alcohol / Substance Use: Not Applicable    Admission diagnosis:  Palpitations [R00.2] DOE (dyspnea on exertion) [R06.00] Chest pain [R07.9] Patient Active Problem List   Diagnosis Date Noted  . Class 3 obesity 09/27/2020  . Normocytic anemia 09/27/2020  . Pre-diabetes   . Chest pain 09/26/2020  . Chronic respiratory failure with hypoxia and hypercapnia ([place]) 07/30/2019  . COPD GOLD II 07/29/2019  . Diuretic-induced hypokalemia 02/05/2019  . DOE (dyspnea on exertion) 02/03/2019  . Chronic venous stasis 07/20/2018  . Status post coronary artery stent placement   . Acute hypotension   . 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 Primary osteoarthritis of left knee 11/22/2017  . Dizziness 08/28/2017  . Ileus ([place]) 07/12/2017  . Essential hypertension 08/14/2016  . HLD (hyperlipidemia) 08/14/2016  . Fall 08/14/2016  . GERD (gastroesophageal reflux disease) 08/14/2016  . Anxiety 08/14/2016  . Acute respiratory failure with hypoxia ([place]) 08/14/2016  . Hyperkalemia 08/14/2016  . CHF exacerbation ([place]) 08/14/2016  .  Acute on chronic diastolic heart failure ([place]) 08/14/2016  . 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 Lumbar post-laminectomy syndrome 06/01/2015  . Debilitated 05/31/2015  . Mass of chest wall, right   . Morbid obesity ([place]) complicated by hbp/hyperlipidemia  03/17/2015  . Cigarette smoker 03/17/2015  . CAD (coronary artery disease) 02/12/2011  . Edema 02/12/2011  . Chronic pain syndrome   . Degenerative joint disease of knee, right   . History of depression   . History of  cholecystectomy    PCP:  Aletha Halim., PA-C Pharmacy:   [id number] - [place], [place]. AT [address] [place] [address] Phone: [phone] Fax: [phone]     Social Determinants of Health (SDOH) Interventions    Readmission Risk Interventions Readmission Risk Prevention Plan 09/30/2020  Transportation Screening Complete  PCP or Specialist Appt within 3-5 Days Complete  HRI or [place] Complete  Social Work Consult for [place] Planning/Counseling Complete  Palliative Care Screening Not Applicable  Medication Review Press photographer) Complete  Some recent data might be hidden

## 2020-09-30 NOTE — Progress Notes (Signed)
Lower extremity venous study completed.  ? ?Please see CV Proc for preliminary results.  ? ?Hatice Bubel, RDMS, RVT ? ? ?

## 2020-10-01 DIAGNOSIS — J449 Chronic obstructive pulmonary disease, unspecified: Secondary | ICD-10-CM | POA: Diagnosis not present

## 2020-10-01 DIAGNOSIS — J9612 Chronic respiratory failure with hypercapnia: Secondary | ICD-10-CM | POA: Diagnosis not present

## 2020-10-01 DIAGNOSIS — J9611 Chronic respiratory failure with hypoxia: Secondary | ICD-10-CM | POA: Diagnosis not present

## 2020-10-01 DIAGNOSIS — R079 Chest pain, unspecified: Secondary | ICD-10-CM | POA: Diagnosis not present

## 2020-10-01 LAB — COMPREHENSIVE METABOLIC PANEL
ALT: 15 U/L (ref 0–44)
AST: 20 U/L (ref 15–41)
Albumin: 3.4 g/dL — ABNORMAL LOW (ref 3.5–5.0)
Alkaline Phosphatase: 60 U/L (ref 38–126)
Anion gap: 10 (ref 5–15)
BUN: 13 mg/dL (ref 8–23)
CO2: 34 mmol/L — ABNORMAL HIGH (ref 22–32)
Calcium: 9.1 mg/dL (ref 8.9–10.3)
Chloride: 91 mmol/L — ABNORMAL LOW (ref 98–111)
Creatinine, Ser: 0.91 mg/dL (ref 0.61–1.24)
GFR, Estimated: >60 mL/min (ref >60)
Glucose, Bld: 117 mg/dL — ABNORMAL HIGH (ref 70–99)
Potassium: 3.6 mmol/L (ref 3.5–5.1)
Sodium: 135 mmol/L (ref 135–145)
Total Bilirubin: 0.6 mg/dL (ref 0.3–1.2)
Total Protein: 6.4 g/dL — ABNORMAL LOW (ref 6.5–8.1)

## 2020-10-01 LAB — CBC
HCT: 32.4 % — ABNORMAL LOW (ref 39.0–52.0)
Hemoglobin: 10.2 g/dL — ABNORMAL LOW (ref 13.0–17.0)
MCH: 28.3 pg (ref 26.0–34.0)
MCHC: 31.5 g/dL (ref 30.0–36.0)
MCV: 89.8 fL (ref 80.0–100.0)
Platelets: 219 10*3/uL (ref 150–400)
RBC: 3.61 MIL/uL — ABNORMAL LOW (ref 4.22–5.81)
RDW: 14.7 % (ref 11.5–15.5)
WBC: 7.1 10*3/uL (ref 4.0–10.5)
nRBC: 0 % (ref 0.0–0.2)

## 2020-10-01 LAB — GLUCOSE, CAPILLARY
Glucose-Capillary: 116 mg/dL — ABNORMAL HIGH (ref 70–99)
Glucose-Capillary: 128 mg/dL — ABNORMAL HIGH (ref 70–99)

## 2020-10-01 LAB — PHOSPHORUS: Phosphorus: 4.7 mg/dL — ABNORMAL HIGH (ref 2.5–4.6)

## 2020-10-01 LAB — MAGNESIUM: Magnesium: 1.9 mg/dL (ref 1.7–2.4)

## 2020-10-01 MED ORDER — CLOPIDOGREL BISULFATE 75 MG PO TABS
75.0000 mg | ORAL_TABLET | Freq: Every day | ORAL | Status: DC
  Administered 2020-10-01 – 2020-10-02 (×2): 75 mg via ORAL
  Filled 2020-10-01 (×2): qty 1

## 2020-10-01 MED ORDER — FUROSEMIDE 40 MG PO TABS
40.0000 mg | ORAL_TABLET | Freq: Every day | ORAL | Status: DC
  Administered 2020-10-01 – 2020-10-02 (×2): 40 mg via ORAL
  Filled 2020-10-01 (×2): qty 1

## 2020-10-01 NOTE — Progress Notes (Signed)
Pt refused cpap. Pt stated that he can not tolerate the dreamstation.

## 2020-10-01 NOTE — Plan of Care (Signed)

## 2020-10-01 NOTE — Progress Notes (Signed)
PROGRESS NOTE  Jerry [name] SWF:[id number] DOB: 08-Feb-1951 DOA: 09/26/2020 PCP: Aletha Halim., PA-C   LOS: 3 days   Brief narrative:  Jerry [name] is a 70 y.o. male with medical history significant of iron deficiency anemia, anxiety, depression, chronic lower back pain, COPD, constipation, CAD, osteoarthritis with right knee replacement, GERD, history of unspecified headaches, hyperlipidemia, hypertension, obesity, osteosarcoma of the rib, peripheral vascular disease, history of pneumonia, prediabetes, tobacco abuse presented to hospital with multiple symptoms including chest discomfort dyspnea and palpitation.  Patient stated that his heart rate was in the 180s and  started developing lower extremity edema.  Is been complaining of some productive cough.  In the ED, patient had stable vitals.  CBC showed no leukocytosis.  D-dimer was within normal range.  BNP was low.  Patient continued to have dyspnea, diaphoresis, dizziness, palpitation and tachycardia on ambulation and was placed in observation.  During hospitalization, patient persisted to have tachycardia on ambulation with hypoxia and remained symptomatic.  Cardiology was consulted.  Cardiology was consulted and patient underwent cardiac catheterization on 07/29/2021.   Assessment/Plan:  Principal Problem:   Chest pain Active Problems:   CAD (coronary artery disease)   Essential hypertension   HLD (hyperlipidemia)   GERD (gastroesophageal reflux disease)   Anxiety   Chronic diastolic HF (heart failure) due to hypertrophic obstructive cardiomyopathy (HCC)   COPD GOLD II   Chronic respiratory failure with hypoxia and hypercapnia (HCC)   Class 3 obesity   Normocytic anemia   Pre-diabetes  Chest pain dyspnea on exertion, diaphoresis, dyspnea and chest pressure on ambulation Patient still feels symptomatic especially on ambulation with hypoxia dizziness diaphoresis on ambulation.  Patient was hypoxic up to 84% on  ambulation today and required up to 5 L of oxygen.  Patient is euvolemic and adequately diuresed but still has symptoms.  Patient follows up with Dr. Melvyn Novas pulmonary as outpatient.  Patient underwent catheterization on 09/29/20 with mention of patent stent.  Cardiology does not believe that patient's dyspnea and symptoms are related to cardiac condition.  Seen by pulmonary who recommended CPAP at nighttime but the patient has not been able to tolerate much..  EKG showing PVCs. on aspirin metoprolol. 2D echocardiogram on 09/27/2020 showed LV ejection fraction of 55 to 60% with mild LVH and indeterminate diastolic parameters. Troponin negative.  Has been transitioned to oral Lasix.    Hypokalemia.  Improved.  Coronary artery disease. on aspirin, atorvastatin and metoprolol.  As per cardiac catheterization on 09/29/2020 with patent LAD and 95% stenosis of first marginal.    Essential hypertension Continue metoprolol, losartan.    Hyperlipidemia Continue atorvastatin     GERD Continue Protonix    Anxiety/depression On Lexapro, trazodone   Acute on chronic diastolic heart failure due to hypertrophic obstructive cardiomyopathy /history of CAD. On losartan, oral diuretic.Marland Kitchen  Patient still symptomatic on ambulation with diaphoresis dizziness, tachycardia and chest pressure and hypoxia.Marland Kitchen  Poor exercise tolerance.   Physical therapy has seen the patient and recommend home health PT/skilled facility    COPD GOLD II with chronic hypoxic and hypercapneic Respiratory failure  Continue 2 L of oxygen by nasal cannula. Continue bronchodilators.  No wheezing.  No indication for steroids.    Class 3 obesity Would benefit from weight loss as outpatient.    Normocytic anemia Monitor H&H.  Debility, deconditioning, poor exercise tolerance.  Currently on IV diuretics.  Hypoxic on ambulation. PT has seen the patient and recommend home health PT/nursing facility   DVT  prophylaxis:    Code Status: Full  code  Family Communication:  None today.   Status is: Inpatient  The patient is inpatient because: IV treatments appropriate due to intensity of illness or inability to take PO, Inpatient level of care appropriate due to severity of illness, dyspnea on exertion with hypoxia, still symptomatic   Dispo: The patient is from: Home              Anticipated d/c is to: Home with PT/skilled nursing facility placement.               Anticipated d/c date is: 1 to 2 days              Patient currently is not medically stable to d/c.   Difficult to place patient No   Consultants:  Cardiology  Pulmonary  Procedures:  2D echo  Anti-infectives:  . None  Anti-infectives (From admission, onward)   None     Subjective: Today, patient was seen and examined at bedside.  Still feels a very symptomatic with dizziness lightheadedness and nausea on ambulating and his pulse ox was 84% on ambulation.  Patient is currently on 5l of oxygen.   Objective: Vitals:   10/01/20 1338 10/01/20 1340  BP:    Pulse: (!) 103 (!) 104  Resp: 20 17  Temp:    SpO2: (!) 86% (!) 84%    Intake/Output Summary (Last 24 hours) at 10/01/2020 1525 Last data filed at 10/01/2020 1300 Gross per 24 hour  Intake 843 ml  Output 2501 ml  Net -1658 ml   Filed Weights   09/29/20 1209 09/30/20 0346 10/01/20 0500  Weight: 133.8 kg 132.7 kg 135.8 kg   Body mass index is 39.5 kg/m.   Physical Exam:  General: Obese built, not in obvious distress, on nasal cannula oxygen, mildly anxious HENT:   No scleral pallor or icterus noted. Oral mucosa is moist.  Chest:   Diminished breath sounds bilaterally.  No wheezes or crackles. CVS: S1 &S2 heard. No murmur.  Regular rate and rhythm. Abdomen: Soft, nontender, nondistended.  Bowel sounds are heard.   Extremities: No cyanosis, clubbing, no edema peripheral pulses are palpable. Psych: Alert, awake and oriented, normal mood CNS:  No cranial nerve deficits.  Power equal in  all extremities.   Skin: Warm and dry.  No rashes noted.  Data Review: I have personally reviewed the following laboratory data and studies,  CBC: Recent Labs  Lab 09/26/20 1743 09/29/20 0137 09/29/20 1653 09/29/20 1654 09/29/20 1702 09/30/20 0016 10/01/20 0059  WBC 7.2 7.3  --   --   --  6.9 7.1  HGB 11.1* 10.6* 11.9* 11.9* 11.2* 10.4* 10.2*  HCT 35.7* 33.6* 35.0* 35.0* 33.0* 33.0* 32.4*  MCV 90.4 89.4  --   --   --  89.7 89.8  PLT 257 250  --   --   --  226 371   Basic Metabolic Panel: Recent Labs  Lab 09/26/20 1743 09/28/20 0039 09/29/20 0121 09/29/20 1653 09/29/20 1654 09/29/20 1702 09/30/20 0016 10/01/20 0059  NA 138 138 138 138 139 137 139 135  K 4.1 3.2* 3.6 4.1 4.1 4.0 4.2 3.6  CL 94* 92* 94*  --   --   --  95* 91*  CO2 36* 34* 32  --   --   --  32 34*  GLUCOSE 123* 124* 114*  --   --   --  110* 117*  BUN 9 11 10   --   --   --  10 13  CREATININE 0.97 0.95 0.98  --   --   --  0.92 0.91  CALCIUM 9.5 9.0 9.1  --   --   --  8.9 9.1  MG  --   --  1.9  --   --   --   --  1.9  PHOS  --   --  4.0  --   --   --   --  4.7*   Liver Function Tests: Recent Labs  Lab 09/29/20 0121 10/01/20 0059  AST 28 20  ALT 16 15  ALKPHOS 66 60  BILITOT 0.7 0.6  PROT 6.9 6.4*  ALBUMIN 3.5 3.4*   No results for input(s): LIPASE, AMYLASE in the last 168 hours. No results for input(s): AMMONIA in the last 168 hours. Cardiac Enzymes: No results for input(s): CKTOTAL, CKMB, CKMBINDEX, TROPONINI in the last 168 hours. BNP (last 3 results) Recent Labs    09/26/20 2030  BNP 53.2    ProBNP (last 3 results) No results for input(s): PROBNP in the last 8760 hours.  CBG: Recent Labs  Lab 09/30/20 1147 09/30/20 1620 09/30/20 2111 10/01/20 0611 10/01/20 1136  GLUCAP 131* 131* 107* 116* 128*   Recent Results (from the past 240 hour(s))  Resp Panel by RT-PCR (Flu A&B, Covid) Nasopharyngeal Swab     Status: None   Collection Time: 09/26/20  8:18 PM   Specimen:  Nasopharyngeal Swab; Nasopharyngeal(NP) swabs in vial transport medium  Result Value Ref Range Status   SARS Coronavirus 2 by RT PCR NEGATIVE NEGATIVE Final    Comment: (NOTE) SARS-CoV-2 target nucleic acids are NOT DETECTED.  The SARS-CoV-2 RNA is generally detectable in upper respiratory specimens during the acute phase of infection. The lowest concentration of SARS-CoV-2 viral copies this assay can detect is 138 copies/mL. A negative result does not preclude SARS-Cov-2 infection and should not be used as the sole basis for treatment or other patient management decisions. A negative result may occur with  improper specimen collection/handling, submission of specimen other than nasopharyngeal swab, presence of viral mutation(s) within the areas targeted by this assay, and inadequate number of viral copies(<138 copies/mL). A negative result must be combined with clinical observations, patient history, and epidemiological information. The expected result is Negative.  Fact Sheet for Patients:  EntrepreneurPulse.com.au  Fact Sheet for Healthcare Providers:  IncredibleEmployment.be  This test is no t yet approved or cleared by the [place] FDA and  has been authorized for detection and/or diagnosis of SARS-CoV-2 by FDA under an Emergency Use Authorization (EUA). This EUA will remain  in effect (meaning this test can be used) for the duration of the COVID-19 declaration under Section 564(b)(1) of the Act, 21 U.S.C.section 360bbb-3(b)(1), unless the authorization is terminated  or revoked sooner.       Influenza A by PCR NEGATIVE NEGATIVE Final   Influenza B by PCR NEGATIVE NEGATIVE Final    Comment: (NOTE) The Xpert Xpress SARS-CoV-2/FLU/RSV plus assay is intended as an aid in the diagnosis of influenza from Nasopharyngeal swab specimens and should not be used as a sole basis for treatment. Nasal washings and aspirates are unacceptable for  Xpert Xpress SARS-CoV-2/FLU/RSV testing.  Fact Sheet for Patients: EntrepreneurPulse.com.au  Fact Sheet for Healthcare Providers: IncredibleEmployment.be  This test is not yet approved or cleared by the [place] FDA and has been authorized for detection and/or diagnosis of SARS-CoV-2 by FDA under an Emergency Use Authorization (EUA). This EUA will remain in effect (meaning this  test can be used) for the duration of the COVID-19 declaration under Section 564(b)(1) of the Act, 21 U.S.C. section 360bbb-3(b)(1), unless the authorization is terminated or revoked.  Performed at [facility], [place] [address] [place], [place] [address]   MRSA PCR Screening     Status: None   Collection Time: 09/27/20  5:21 PM   Specimen: Nasopharyngeal  Result Value Ref Range Status   MRSA by PCR NEGATIVE NEGATIVE Final    Comment:        The GeneXpert MRSA Assay (FDA approved for NASAL specimens only), is one component of a comprehensive MRSA colonization surveillance program. It is not intended to diagnose MRSA infection nor to guide or monitor treatment for MRSA infections. Performed at [facility], [place] [address], [place], [place] [address]   Expectorated Sputum Assessment w Gram Stain, Rflx to Resp Cult     Status: None   Collection Time: 09/30/20 10:19 AM   Specimen: Expectorated Sputum  Result Value Ref Range Status   Specimen Description EXPECTORATED SPUTUM  Final   Special Requests NONE  Final   Sputum evaluation   Final    Sputum specimen not acceptable for testing.  Please recollect.   RESULT CALLED TO, READ BACK BY AND VERIFIED WITHCharlean Sanfilippo RN 1630 09/30/20 A BROWNING Performed at [facility], [place] [address], [place], [place] [address]    Report Status 09/30/2020 FINAL  Final     Studies: CARDIAC CATHETERIZATION  Result Date: 09/29/2020  1st Mrg lesion is 95% stenosed.  Previously placed Mid LAD stent (unknown  type) is widely patent.  KASSIDY FRANKSON is a 70 y.o. male  [id number] LOCATION:  FACILITY: [facility] PHYSICIAN: Quay Burow, M.D. 18-Sep-1950 DATE OF PROCEDURE:  09/29/2020 DATE OF DISCHARGE: CARDIAC CATHETERIZATION History obtained from chart review.70 y.o. male with hx of CAD, HTN, HLD chronic diastolic chf admitted with severe DOE .  His enzymes were negative.  His EF was normal by 2D echo.  He was referred for right left heart cath to define his anatomy and physiology.   Mr. Hyser has widely patent LAD stent with no obstructive disease otherwise.  He has normal filling pressures.  I do not think that his shortness of breath is ischemically mediated.  He appears to be euvolemic and well diuresed.  The radial sheath was removed and a TR band was placed on the right wrist to achieve patent hemostasis.  A Mynx device was successfully deployed in the right common femoral vein.  The patient left lab in stable condition. Quay Burow. MD, [facility] 09/29/2020 5:14 PM   VAS [place] LOWER EXTREMITY VENOUS (DVT)  Result Date: 10/01/2020  Lower Venous DVT Study Indications: Increased swelling and pain bilaterally in PT w/ history of CHF.  Limitations: Bandaging s/p RT cath. Comparison Study: No prior studies. Performing Technologist: Darlin Coco RDMS  Examination Guidelines: A complete evaluation includes B-mode imaging, spectral Doppler, color Doppler, and power Doppler as needed of all accessible portions of each vessel. Bilateral testing is considered an integral part of a complete examination. Limited examinations for reoccurring indications may be performed as noted. The reflux portion of the exam is performed with the patient in reverse Trendelenburg.  +---------+---------------+---------+-----------+----------+--------------+ RIGHT    CompressibilityPhasicitySpontaneityPropertiesThrombus Aging +---------+---------------+---------+-----------+----------+--------------+ CFV      Full           Yes      Yes                                  +---------+---------------+---------+-----------+----------+--------------+  SFJ      Full                                                        +---------+---------------+---------+-----------+----------+--------------+ FV Prox  Full                                                        +---------+---------------+---------+-----------+----------+--------------+ FV Mid   Full                                                        +---------+---------------+---------+-----------+----------+--------------+ FV DistalFull                                                        +---------+---------------+---------+-----------+----------+--------------+ PFV      Full                                                        +---------+---------------+---------+-----------+----------+--------------+ POP      Full           Yes      Yes                                 +---------+---------------+---------+-----------+----------+--------------+ PTV      Full                                                        +---------+---------------+---------+-----------+----------+--------------+ PERO     Full                                                        +---------+---------------+---------+-----------+----------+--------------+   +---------+---------------+---------+-----------+----------+--------------+ LEFT     CompressibilityPhasicitySpontaneityPropertiesThrombus Aging +---------+---------------+---------+-----------+----------+--------------+ CFV      Full           Yes      Yes                                 +---------+---------------+---------+-----------+----------+--------------+ SFJ      Full                                                        +---------+---------------+---------+-----------+----------+--------------+  FV Prox  Full                                                         +---------+---------------+---------+-----------+----------+--------------+ FV Mid   Full                                                        +---------+---------------+---------+-----------+----------+--------------+ FV DistalFull                                                        +---------+---------------+---------+-----------+----------+--------------+ PFV      Full                                                        +---------+---------------+---------+-----------+----------+--------------+ POP      Full           Yes      Yes                                 +---------+---------------+---------+-----------+----------+--------------+ PTV      Full                                                        +---------+---------------+---------+-----------+----------+--------------+ PERO     Full                                                        +---------+---------------+---------+-----------+----------+--------------+     Summary: RIGHT: - There is no evidence of deep vein thrombosis in the lower extremity.  - No cystic structure found in the popliteal fossa.  LEFT: - There is no evidence of deep vein thrombosis in the lower extremity.  - No cystic structure found in the popliteal fossa.  *See table(s) above for measurements and observations. Electronically signed by Ruta Hinds MD on 10/01/2020 at 10:46:44 AM.    Final       Flora Lipps, MD  [facility] 10/01/2020  If 7PM-7AM, please contact night-coverage

## 2020-10-01 NOTE — Progress Notes (Signed)
SATURATION QUALIFICATIONS: (This note is used to comply with regulatory documentation for home oxygen)    Patient Saturations on 3 Liters of oxygen while Ambulating = 84%  Patient saturation on 5 Liters of oxygen while Ambulation = 89%  Please briefly explain why patient needs home oxygen: Pt needs increase demand of oxygenation while ambulating

## 2020-10-01 NOTE — Progress Notes (Signed)
NAME:  Jerry [name], MRN:  [id number], DOB:  11/24/1950, LOS: 3 ADMISSION DATE:  09/26/2020, CONSULTATION DATE:  2/25 REFERRING MD:  Betsey Amen, CHIEF COMPLAINT:  dyspnea   Brief History:  70 y/o male with multiple medical problems admitted with dyspnea which is multifactorial.   Has COPD, pulmonary hypertension, broncomalacia, CAD, obesity.  Past Medical History:  COPD, Chronic Hypoxia- on 2L [place] Baseline, nocturnal hypoxemia, CAD s/p stent to LAD, HTN, HLD, GERD  Significant Hospital Events:    Consults:  Cardiology pulmonology  Procedures:    Significant Diagnostic Tests:  CT chest from 06/2020 reviewed> subsegmental atelectasis scattered throughout lungs, worse on R, plate noted anterior R hemithorax, no clear emphysema, however moderate to severe bronchomalacia noted bilateral mainstem bronchi    CXR 2/21 personally reviewed> elevated R hemidiaphragm otherwise pulmonary parenchymal unremarkable   RHC 2/24:  Right atrial pressure-5/5 Right ventricular pressure-27/3 Pulmonary artery pressure-27/18, mean 23 Pulmonary capital wedge pressure-A-wave 10, V wave 11, mean 9 LVEDP-7 Cardiac output 8 L/min with an index of 3.2 L/min/m    LHC 2/24: "Mr. Bartoli has widely patent LAD stent with no obstructive disease otherwise"   2/25 lower ext doppler negative  Micro Data:  2/25 sputum >   Antimicrobials:     Interim History / Subjective:  Feels about the same Didn't like cpap  Objective   Blood pressure 126/68, pulse 72, temperature 98.1 F (36.7 C), temperature source Oral, resp. rate 16, height 6\' 1"  (1.854 m), weight 135.8 kg, SpO2 96 %.        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 > needs outpatient sleep study > needs to lose about 30-40 pounds > needs to exercise more > consider LTAC or SNF referral for aggressive physical conditioning/training > wean off O2 as needed to maintain O2 saturation > 88% > continue Breo/Incruise in hospital, but when returns home needs to resume Breztri and SABA as directed by Dr. Melvyn Novas  PCCM available PRN  Best practice (evaluated daily)  Per [facility]  Goals of Care:   PER [facility]  Labs   CBC: Recent Labs  Lab 09/26/20 1743 09/29/20 0137 09/29/20 1653 09/29/20 1654 09/29/20 1702 09/30/20 0016 10/01/20 0059  WBC 7.2 7.3  --   --   --  6.9 7.1  HGB 11.1* 10.6* 11.9* 11.9* 11.2* 10.4* 10.2*  HCT 35.7* 33.6* 35.0* 35.0* 33.0* 33.0* 32.4*  MCV 90.4 89.4  --   --   --  89.7 89.8  PLT 257 250  --   --   --  226 169    Basic Metabolic Panel: Recent Labs  Lab 09/26/20 1743 09/28/20 0039 09/29/20 0121 09/29/20 1653  09/29/20 1654 09/29/20 1702 09/30/20 0016 10/01/20 0059  NA 138 138 138 138 139 137 139 135  K 4.1 3.2* 3.6 4.1 4.1 4.0 4.2 3.6  CL 94* 92* 94*  --   --   --  95* 91*  CO2 36* 34* 32  --   --   --  32 34*  GLUCOSE 123* 124* 114*  --   --   --  110* 117*  BUN 9 11 10   --   --   --  10 13  CREATININE 0.97 0.95 0.98  --   --   --  0.92 0.91  CALCIUM 9.5 9.0 9.1  --   --   --  8.9 9.1  MG  --   --  1.9  --   --   --   --  1.9  PHOS  --   --  4.0  --   --   --   --  4.7*   GFR: Estimated Creatinine  Clearance: 110.9 mL/min (by C-G formula based on SCr of 0.91 mg/dL). Recent Labs  Lab 09/26/20 1743 09/29/20 0137 09/30/20 0016 09/30/20 1037 10/01/20 0059  PROCALCITON  --   --   --  <0.10  --   WBC 7.2 7.3 6.9  --  7.1    Liver Function Tests: Recent Labs  Lab 09/29/20 0121 10/01/20 0059  AST 28 20  ALT 16 15  ALKPHOS 66 60  BILITOT 0.7 0.6  PROT 6.9 6.4*  ALBUMIN 3.5 3.4*   No results for input(s): LIPASE, AMYLASE in the last 168 hours. No results for input(s): AMMONIA in the last 168 hours.  ABG    Component Value Date/Time   PHART 7.388 09/29/2020 1702   PCO2ART 63.0 (H) 09/29/2020 1702   PO2ART 88 09/29/2020 1702   HCO3 37.9 (H) 09/29/2020 1702   TCO2 40 (H) 09/29/2020 1702   O2SAT 96.0 09/29/2020 1702     Coagulation Profile: No results for input(s): INR, PROTIME in the last 168 hours.  Cardiac Enzymes: No results for input(s): CKTOTAL, CKMB, CKMBINDEX, TROPONINI in the last 168 hours.  HbA1C: Hgb A1c MFr Bld  Date/Time Value Ref Range Status  09/27/2020 03:50 AM 6.0 (H) 4.8 - 5.6 % Final    Comment:    (NOTE) Pre diabetes:          5.7%-6.4%  Diabetes:              >6.4%  Glycemic control for   <7.0% adults with diabetes   11/15/2017 12:00 PM 5.0 4.8 - 5.6 % Final    Comment:    (NOTE) Pre diabetes:          5.7%-6.4% Diabetes:              >6.4% Glycemic control for   <7.0% adults with diabetes     CBG: Recent Labs  Lab 09/30/20 0608 09/30/20 1147 09/30/20 1620 09/30/20 2111 10/01/20 0611  GLUCAP 113* 131* 131* 107* 116*       Critical care time: n/a     Roselie Awkward, MD [place] [facility] Pager: [phone] Cell: [phone] If no response, call [phone]

## 2020-10-01 NOTE — Progress Notes (Signed)
Cardiology Progress Note  Patient ID: Jerry [name] MRN: [id number] DOB: 1950/08/25 Date of Encounter: 10/01/2020  Primary Cardiologist: Quay Burow, MD  Subjective   Chief Complaint: Shortness of breath  HPI: Left heart cath unchanged from prior.  Euvolemic on examination and by heart cath.  ROS:  All other ROS reviewed and negative. Pertinent positives noted in the HPI.     Inpatient Medications  Scheduled Meds: . aspirin  81 mg Oral Daily  . atorvastatin  80 mg Oral q1800  . cholecalciferol  1,000 Units Oral Daily  . clopidogrel  75 mg Oral Q breakfast  . dutasteride  0.5 mg Oral QHS  . escitalopram  20 mg Oral Daily  . ferrous sulfate  325 mg Oral Daily  . fluticasone furoate-vilanterol  1 puff Inhalation Daily   And  . umeclidinium bromide  1 puff Inhalation Daily  . furosemide  40 mg Oral Daily  . losartan  25 mg Oral Daily  . mouth rinse  15 mL Mouth Rinse BID  . metoprolol tartrate  25 mg Oral BID  . nortriptyline  40 mg Oral QHS  . pantoprazole  40 mg Oral BID  . polyethylene glycol  17 g Oral QPM  . senna  2 tablet Oral BID  . sertraline  50 mg Oral Daily  . sodium chloride flush  3 mL Intravenous Q12H  . sodium chloride flush  3 mL Intravenous Q12H  . tamsulosin  0.8 mg Oral Daily  . traZODone  300 mg Oral QHS   Continuous Infusions: . sodium chloride     PRN Meds: sodium chloride, acetaminophen, albuterol, bisacodyl, calcium carbonate, HYDROmorphone, nitroGLYCERIN, ondansetron (ZOFRAN) IV, sodium chloride flush, tiZANidine   Vital Signs   Vitals:   09/30/20 2235 09/30/20 2300 10/01/20 0300 10/01/20 0500  BP:  121/60 115/73   Pulse: 63 60 63   Resp: 15 13 17    Temp:  98.4 F (36.9 C) 98 F (36.7 C)   TempSrc:  Oral Oral   SpO2: 97% 97% 98%   Weight:    135.8 kg  Height:        Intake/Output Summary (Last 24 hours) at 10/01/2020 2505 Last data filed at 09/30/2020 2009 Gross per 24 hour  Intake 1020 ml  Output 3075 ml  Net -2055 ml    Last 3 Weights 10/01/2020 09/30/2020 09/29/2020  Weight (lbs) 299 lb 6.2 oz 292 lb 8.8 oz 295 lb  Weight (kg) 135.8 kg 132.7 kg 133.811 kg      Telemetry  Overnight telemetry shows sinus bradycardia heart rate in the 50s, and frequent PVCs, which I personally reviewed.   Physical Exam   Vitals:   09/30/20 2235 09/30/20 2300 10/01/20 0300 10/01/20 0500  BP:  121/60 115/73   Pulse: 63 60 63   Resp: 15 13 17    Temp:  98.4 F (36.9 C) 98 F (36.7 C)   TempSrc:  Oral Oral   SpO2: 97% 97% 98%   Weight:    135.8 kg  Height:         Intake/Output Summary (Last 24 hours) at 10/01/2020 3976 Last data filed at 09/30/2020 2009 Gross per 24 hour  Intake 1020 ml  Output 3075 ml  Net -2055 ml    Last 3 Weights 10/01/2020 09/30/2020 09/29/2020  Weight (lbs) 299 lb 6.2 oz 292 lb 8.8 oz 295 lb  Weight (kg) 135.8 kg 132.7 kg 133.811 kg    Body mass index is 39.5 kg/m.  General: Well  nourished, well developed, in no acute distress Head: Atraumatic, normal size  Eyes: PEERLA, EOMI  Neck: Supple, no JVD Endocrine: No thryomegaly Cardiac: Normal S1, S2; RRR; 2 out of 6 systolic ejection murmur Lungs: Clear to auscultation bilaterally, no wheezing, rhonchi or rales  Abd: Soft, nontender, no hepatomegaly  Ext: No edema, pulses 2+, right femoral cath site clean and dry without hematoma or bruit, right radial cath site clean and dry without hematoma or bruit Musculoskeletal: No deformities, BUE and BLE strength normal and equal Skin: Warm and dry, no rashes   Neuro: Alert and oriented to person, place, time, and situation, CNII-XII grossly intact, no focal deficits  Psych: Normal mood and affect   Labs  High Sensitivity Troponin:   Recent Labs  Lab 09/26/20 1743 09/26/20 2030  TROPONINIHS 7 7     Cardiac EnzymesNo results for input(s): TROPONINI in the last 168 hours. No results for input(s): TROPIPOC in the last 168 hours.  Chemistry Recent Labs  Lab 09/29/20 0121 09/29/20 1653  09/29/20 1702 09/30/20 0016 10/01/20 0059  NA 138   < > 137 139 135  K 3.6   < > 4.0 4.2 3.6  CL 94*  --   --  95* 91*  CO2 32  --   --  32 34*  GLUCOSE 114*  --   --  110* 117*  BUN 10  --   --  10 13  CREATININE 0.98  --   --  0.92 0.91  CALCIUM 9.1  --   --  8.9 9.1  PROT 6.9  --   --   --  6.4*  ALBUMIN 3.5  --   --   --  3.4*  AST 28  --   --   --  20  ALT 16  --   --   --  15  ALKPHOS 66  --   --   --  60  BILITOT 0.7  --   --   --  0.6  GFRNONAA >60  --   --  >60 >60  ANIONGAP 12  --   --  12 10   < > = values in this interval not displayed.    Hematology Recent Labs  Lab 09/29/20 0137 09/29/20 1653 09/29/20 1702 09/30/20 0016 10/01/20 0059  WBC 7.3  --   --  6.9 7.1  RBC 3.76*  --   --  3.68* 3.61*  HGB 10.6*   < > 11.2* 10.4* 10.2*  HCT 33.6*   < > 33.0* 33.0* 32.4*  MCV 89.4  --   --  89.7 89.8  MCH 28.2  --   --  28.3 28.3  MCHC 31.5  --   --  31.5 31.5  RDW 15.0  --   --  14.7 14.7  PLT 250  --   --  226 219   < > = values in this interval not displayed.   BNP Recent Labs  Lab 09/26/20 2030  BNP 53.2    DDimer  Recent Labs  Lab 09/26/20 2030  DDIMER 0.33     Radiology  CARDIAC CATHETERIZATION  Result Date: 09/29/2020  1st Mrg lesion is 95% stenosed.  Previously placed Mid LAD stent (unknown type) is widely patent.  Jerry [name] is a 70 y.o. male  [id number] LOCATION:  FACILITY: [facility] PHYSICIAN: Quay Burow, M.D. 1950-08-24 DATE OF PROCEDURE:  09/29/2020 DATE OF DISCHARGE: CARDIAC CATHETERIZATION History obtained from chart review.70 y.o. male with hx  of CAD, HTN, HLD chronic diastolic chf admitted with severe DOE .  His enzymes were negative.  His EF was normal by 2D echo.  He was referred for right left heart cath to define his anatomy and physiology.   Jerry [name] has widely patent LAD stent with no obstructive disease otherwise.  He has normal filling pressures.  I do not think that his shortness of breath is ischemically mediated.  He  appears to be euvolemic and well diuresed.  The radial sheath was removed and a TR band was placed on the right wrist to achieve patent hemostasis.  A Mynx device was successfully deployed in the right common femoral vein.  The patient left lab in stable condition. Quay Burow. MD, [facility] 09/29/2020 5:14 PM   VAS [place] LOWER EXTREMITY VENOUS (DVT)  Result Date: 09/30/2020  Lower Venous DVT Study Indications: Increased swelling and pain bilaterally in PT w/ history of CHF.  Limitations: Bandaging s/p RT cath. Comparison Study: No prior studies. Performing Technologist: Darlin Coco RDMS  Examination Guidelines: A complete evaluation includes B-mode imaging, spectral Doppler, color Doppler, and power Doppler as needed of all accessible portions of each vessel. Bilateral testing is considered an integral part of a complete examination. Limited examinations for reoccurring indications may be performed as noted. The reflux portion of the exam is performed with the patient in reverse Trendelenburg.  +---------+---------------+---------+-----------+----------+--------------+ RIGHT    CompressibilityPhasicitySpontaneityPropertiesThrombus Aging +---------+---------------+---------+-----------+----------+--------------+ CFV      Full           Yes      Yes                                 +---------+---------------+---------+-----------+----------+--------------+ SFJ      Full                                                        +---------+---------------+---------+-----------+----------+--------------+ FV Prox  Full                                                        +---------+---------------+---------+-----------+----------+--------------+ FV Mid   Full                                                        +---------+---------------+---------+-----------+----------+--------------+ FV DistalFull                                                         +---------+---------------+---------+-----------+----------+--------------+ PFV      Full                                                        +---------+---------------+---------+-----------+----------+--------------+  POP      Full           Yes      Yes                                 +---------+---------------+---------+-----------+----------+--------------+ PTV      Full                                                        +---------+---------------+---------+-----------+----------+--------------+ PERO     Full                                                        +---------+---------------+---------+-----------+----------+--------------+   +---------+---------------+---------+-----------+----------+--------------+ LEFT     CompressibilityPhasicitySpontaneityPropertiesThrombus Aging +---------+---------------+---------+-----------+----------+--------------+ CFV      Full           Yes      Yes                                 +---------+---------------+---------+-----------+----------+--------------+ SFJ      Full                                                        +---------+---------------+---------+-----------+----------+--------------+ FV Prox  Full                                                        +---------+---------------+---------+-----------+----------+--------------+ FV Mid   Full                                                        +---------+---------------+---------+-----------+----------+--------------+ FV DistalFull                                                        +---------+---------------+---------+-----------+----------+--------------+ PFV      Full                                                        +---------+---------------+---------+-----------+----------+--------------+ POP      Full           Yes      Yes                                  +---------+---------------+---------+-----------+----------+--------------+  PTV      Full                                                        +---------+---------------+---------+-----------+----------+--------------+ PERO     Full                                                        +---------+---------------+---------+-----------+----------+--------------+     Summary: RIGHT: - There is no evidence of deep vein thrombosis in the lower extremity.  - No cystic structure found in the popliteal fossa.  LEFT: - There is no evidence of deep vein thrombosis in the lower extremity.  - No cystic structure found in the popliteal fossa.  *See table(s) above for measurements and observations.    Preliminary     Cardiac Studies  TTE 09/27/2020 1. Left ventricular ejection fraction, by estimation, is 55 to 60%. The  left ventricle has normal function. The left ventricle demonstrates  regional wall motion abnormalities (see scoring diagram/findings for  description). There is mild concentric left  ventricular hypertrophy. Left ventricular diastolic parameters are  indeterminate. There is hypokinesis of the left ventricular, basal  inferior wall.  2. Right ventricular systolic function is normal. The right ventricular  size is normal.  3. Left atrial size was mildly dilated.  4. The mitral valve is normal in structure. No evidence of mitral valve  regurgitation. No evidence of mitral stenosis.  5. The aortic valve has an indeterminant number of cusps. There is mild  calcification of the aortic valve. There is moderate thickening of the  aortic valve. Aortic valve regurgitation is trivial. Mild to moderate  aortic valve sclerosis/calcification is  present, without any evidence of aortic stenosis.  6. Aortic dilatation noted. There is borderline dilatation of the  ascending aorta, measuring 38 mm.   Left heart cath 09/29/2020 Mid LAD stent patent OM1 95% (unchanged) Right atrial  pressure 2 mmHg Mean PA pressure 23 mmHg Mean wedge pressure 9 mmHg LVEDP 7 mmHg  Patient Profile  Jerry [name] is a 70 y.o. male with CAD status post PCI, COPD, diastolic heart failure who was admitted on 09/26/2020 with chest pain.  Also was admitted with congestive heart failure.  Underwent left heart cath on 09/29/2020 which shows no evidence of new obstructive CAD.  Symptoms not related to CAD.  Assessment & Plan   1.  Shortness of breath with exertion -He is euvolemic.  LVEDP 7 mmHg.  Mean wedge pressure 9 mmHg.  No further diuresis.  Symptoms are likely COPD related. -Symptoms not related to CAD.  Left heart cath is unchanged. -We will continue aggressive pulmonary toilet and evaluation by pulmonary. -No further cardiac work-up needed.  2.  Acute diastolic heart failure -Net -7.9 L since admission.  Wedge pressure 9 mmHg.  He is euvolemic. -Restart home Lasix 40 mg daily.  3.  Chest pain/CAD -Left heart cath is unchanged.  Mid LAD stent is patent.  95% OM1 lesion unchanged from prior cath. -Cardiac enzymes negative for ACS.  Given that this lesion is unchanged I recommend continue medical management.  With treatment of his COPD and adequate volume removal  as he has had his symptoms should improve. -Continue aspirin Plavix (on DAPT at home) and home statin. -Continue beta-blocker. -BP well controlled.  [facility] will sign off.   Medication Recommendations: Home medications as above.  Lasix 40 mg p.o. daily. Other recommendations (labs, testing, etc): None Follow up as an outpatient: We will arrange hospital follow-up with Dr. Alvester Chou in 2 to 4 weeks  For questions or updates, please contact [facility] Please consult [email or web address] for contact info under   Time Spent with Patient: I have spent a total of 25 minutes with patient reviewing hospital notes, telemetry, EKGs, labs and examining the patient as well as establishing an assessment and plan that was discussed  with the patient.  > 50% of time was spent in direct patient care.    Signed, Addison Naegeli. Audie Box, [place]  10/01/2020 7:12 AM

## 2020-10-02 ENCOUNTER — Other Ambulatory Visit: Payer: Self-pay | Admitting: Cardiovascular Disease

## 2020-10-02 LAB — LEGIONELLA PNEUMOPHILA SEROGP 1 UR AG: L. pneumophila Serogp 1 Ur Ag: NEGATIVE

## 2020-10-02 LAB — GLUCOSE, CAPILLARY: Glucose-Capillary: 142 mg/dL — ABNORMAL HIGH (ref 70–99)

## 2020-10-02 MED ORDER — ALBUTEROL SULFATE HFA 108 (90 BASE) MCG/ACT IN AERS: 2.0000 | INHALATION_SPRAY | Freq: Four times a day (QID) | RESPIRATORY_TRACT | 2 refills | Status: DC | PRN

## 2020-10-02 MED ORDER — FUROSEMIDE 20 MG PO TABS: 40.0000 mg | ORAL_TABLET | Freq: Every day | ORAL | 0 refills | Status: DC

## 2020-10-02 NOTE — Discharge Summary (Signed)
Physician Discharge Summary  Jerry [name] LTJ:[id number] DOB: Jul 28, 1951 DOA: 09/26/2020  PCP: Aletha Halim., PA-C  Admit date: 09/26/2020 Discharge date: 10/02/2020  Admitted From: Home  Discharge disposition: Home PT  Recommendations for Outpatient Follow-Up:   . Follow up with your primary care provider in one week.  . Check CBC, BMP, magnesium in the next visit . Follow-up with your pulmonary physician Dr. Melvyn Novas in 2 to 3 weeks.  Discharge Diagnosis:   Principal Problem:   Chest pain Active Problems:   CAD (coronary artery disease)   Essential hypertension   HLD (hyperlipidemia)   GERD (gastroesophageal reflux disease)   Anxiety   Chronic diastolic HF (heart failure) due to hypertrophic obstructive cardiomyopathy (HCC)   COPD GOLD II   Chronic respiratory failure with hypoxia and hypercapnia (HCC)   Class 3 obesity   Normocytic anemia   Pre-diabetes   Discharge Condition: Improved.  Diet recommendation: Low sodium, heart healthy  Wound care: None.  Code status: Full.   History of Present Illness:   Jerry [name] is a 70 y.o. male with medical history significant of iron deficiency anemia, anxiety, depression, chronic lower back pain, COPD, constipation, CAD, osteoarthritis with right knee replacement, GERD, history of unspecified headaches, hyperlipidemia, hypertension, obesity, osteosarcoma of the rib, peripheral vascular disease, history of pneumonia, prediabetes, tobacco abuse presented to hospital with multiple symptoms including chest discomfort dyspnea and palpitation.  Patient stated that his heart rate was in the 180s and  started developing lower extremity edema.  Is been complaining of some productive cough.  In the ED, patient had stable vitals.  CBC showed no leukocytosis.  D-dimer was within normal range.  BNP was low.  Patient continued to have dyspnea, diaphoresis, dizziness, palpitation and tachycardia on ambulation and patient was  admitted to hospital for further evaluation and treatment.    Hospital Course:   Following conditions were addressed during hospitalization as listed below,  Chest pain, dyspnea on exertion, diaphoresis, dyspnea and chest pressure on ambulation Patient was hypoxic on ambulation.  Pulmonary and cardiology saw the patient.  Patient was given diuretics and patient was euvolemic despite that he had symptoms so pulmonary evaluated the patient.  Pulmonary has impression of Gold 3 COPD, pulmonary hypertension, obstructive sleep apnea.  Patient also underwent cardiac catheterization with patent stent but with mild pulmonary hypertension.  Patient was advised 5 L oxygen on ambulation.  He also has bronchomalacia but did not wish to be on CPAP at nighttime. Patient follows up with Dr. Melvyn Novas pulmonary as outpatient.  Seen by pulmonary who recommended CPAP at nighttime but the patient has not been able to tolerate much..  EKG showing PVCs. On aspirin, metoprolol. 2D echocardiogram on 09/27/2020 showed LV ejection fraction of 55 to 60% with mild LVH and indeterminate diastolic parameters. Troponin negative.    Patient will be transitioned to daily Lasix on discharge.   Hypokalemia.  Improved after replacement.   Coronary artery disease. Continue aspirin, atorvastatin and metoprolol.  As per cardiac catheterization on 09/29/2020 with patent LAD and 95% stenosis of first marginal.     Essential hypertension Continue metoprolol, losartan.     Hyperlipidemia Continue atorvastatin      GERD Continue Protonix     Anxiety/depression On Lexapro, trazodone    Acute on chronic diastolic heart failure due to hypertrophic obstructive cardiomyopathy /history of CAD. On losartan, oral diuretic.  Poor exercise tolerance.   Physical therapy has seen the patient and recommend home health PT/skilled facility  wishes to go home with home health OT and PT.     COPD GOLD II with chronic hypoxic and hypercapneic Respiratory  failure  Continue 2 L of oxygen by nasal cannula.  Patient will need 5 L of oxygen by cannula on ambulation. Continue bronchodilators on discharge.     Class 3 obesity Would benefit from weight loss as outpatient.     Normocytic anemia Monitor H&H.   Debility, deconditioning, poor exercise tolerance.   Patient received IV diuretics.  PT has seen the patient and recommend home health PT/nursing facility    Disposition.  At this time, patient is stable for disposition home with home health.  Medical Consultants:    Cardiology  Pulmonary  Procedures:     2D echo  Subjective:   Today, patient was seen and examined at bedside.  Feels okay while lying down.  States that his oxygen was abdominal walking.  Discussed with him about the need for higher oxygen on ambulation  Discharge Exam:   Vitals:   10/02/20 0851 10/02/20 0923  BP:  (!) 125/94  Pulse:  67  Resp:    Temp:    SpO2: 96%    Vitals:   10/02/20 0500 10/02/20 0735 10/02/20 0851 10/02/20 0923  BP:    (!) 125/94  Pulse:    67  Resp:      Temp:  98.2 F (36.8 C)    TempSrc:  Oral    SpO2:   96%   Weight: 135.3 kg     Height:       General: Alert awake, not in obvious distress, morbidly obese, on nasal cannula 2 L/min. HENT: pupils equally reacting to light,  No scleral pallor or icterus noted. Oral mucosa is moist.  Chest:   Diminished breath sounds bilaterally. No crackles or wheezes.  CVS: S1 &S2 heard. No murmur.  Regular rate and rhythm. Abdomen: Soft, nontender, nondistended.  Bowel sounds are heard.   Extremities: No cyanosis, clubbing or edema.  Peripheral pulses are palpable. Psych: Alert, awake and oriented, normal mood CNS:  No cranial nerve deficits.  Power equal in all extremities.   Skin: Warm and dry.  No rashes noted.  The results of significant diagnostics from this hospitalization (including imaging, microbiology, ancillary and laboratory) are listed below for reference.     Diagnostic  Studies:   DG Chest 2 View  Result Date: 09/26/2020 CLINICAL DATA:  Shortness of breath and chest pain with history of MI and multiple stents. EXAM: CHEST - 2 VIEW COMPARISON:  Chest radiograph February 03, 2019 and chest CT June 09, 2020 FINDINGS: Enlarged cardiac silhouette. Stable elevated right hemidiaphragm with associated scarring. Bibasilar interstitial opacities. Similar bilateral pleural plaques. Similar changes of prior right anterior rib resections. Prior right clavicle fracture. IMPRESSION: Bibasilar interstitial opacities superimposed on chronic lung changes. Findings may reflect pulmonary edema or infection. Electronically Signed   By: Dahlia Bailiff MD   On: 09/26/2020 18:37   ECHOCARDIOGRAM COMPLETE  Result Date: 09/27/2020    ECHOCARDIOGRAM REPORT   Patient Name:   Jerry [name] Date of Exam: 09/27/2020 Medical Rec #:  [id number]        Height:       74.0 in Accession #:    [id number]       Weight:       303.0 lb Date of Birth:  [birth year]       BSA:          2.593 m Patient Age:  69 years         BP:           122/73 mmHg Patient Gender: M                HR:           72 bpm. Exam Location:  Inpatient Procedure: 2D Echo, Cardiac Doppler, Color Doppler and Intracardiac            Opacification Agent Indications:    I50.33 Acute on chronic diastolic (congestive) heart failure  History:        Patient has prior history of Echocardiogram examinations, most                 recent 08/26/2019. CHF, Previous Myocardial Infarction and CAD,                 COPD, Signs/Symptoms:Dyspnea; Risk Factors:Hypertension,                 Dyslipidemia, Current Smoker and GERD.  Sonographer:    Jonelle Sidle Dance Referring Phys: [id number] [place]  1. Left ventricular ejection fraction, by estimation, is 55 to 60%. The left ventricle has normal function. The left ventricle demonstrates regional wall motion abnormalities (see scoring diagram/findings for description). There is mild concentric  left ventricular hypertrophy. Left ventricular diastolic parameters are indeterminate. There is hypokinesis of the left ventricular, basal inferior wall.  2. Right ventricular systolic function is normal. The right ventricular size is normal.  3. Left atrial size was mildly dilated.  4. The mitral valve is normal in structure. No evidence of mitral valve regurgitation. No evidence of mitral stenosis.  5. The aortic valve has an indeterminant number of cusps. There is mild calcification of the aortic valve. There is moderate thickening of the aortic valve. Aortic valve regurgitation is trivial. Mild to moderate aortic valve sclerosis/calcification is present, without any evidence of aortic stenosis.  6. Aortic dilatation noted. There is borderline dilatation of the ascending aorta, measuring 38 mm. Comparison(s): No significant change from prior study. FINDINGS  Left Ventricle: Left ventricular ejection fraction, by estimation, is 55 to 60%. The left ventricle has normal function. The left ventricle demonstrates regional wall motion abnormalities. Definity contrast agent was given IV to delineate the left ventricular endocardial borders. The left ventricular internal cavity size was normal in size. There is mild concentric left ventricular hypertrophy. Left ventricular diastolic parameters are indeterminate. Right Ventricle: The right ventricular size is normal. Right vetricular wall thickness was not well visualized. Right ventricular systolic function is normal. Left Atrium: Left atrial size was mildly dilated. Right Atrium: Right atrial size was normal in size. Pericardium: There is no evidence of pericardial effusion. Presence of pericardial fat pad. Mitral Valve: The mitral valve is normal in structure. No evidence of mitral valve regurgitation. No evidence of mitral valve stenosis. Tricuspid Valve: The tricuspid valve is normal in structure. Tricuspid valve regurgitation is trivial. No evidence of tricuspid  stenosis. Aortic Valve: The aortic valve has an indeterminant number of cusps. There is mild calcification of the aortic valve. There is moderate thickening of the aortic valve. Aortic valve regurgitation is trivial. Mild to moderate aortic valve sclerosis/calcification is present, without any evidence of aortic stenosis. Pulmonic Valve: The pulmonic valve was not well visualized. Pulmonic valve regurgitation is not visualized. Aorta: Aortic dilatation noted. There is borderline dilatation of the ascending aorta, measuring 38 mm. Venous: The inferior vena cava was not well visualized. IAS/Shunts: The atrial septum  is grossly normal.  LEFT VENTRICLE PLAX 2D LVIDd:         5.60 cm LVIDs:         5.10 cm LV PW:         1.50 cm LV IVS:        1.40 cm LVOT diam:     2.10 cm LV SV:         61 LV SV Index:   24 LVOT Area:     3.46 cm  RIGHT VENTRICLE             IVC RV Basal diam:  3.50 cm     IVC diam: 2.30 cm RV Mid diam:    2.30 cm RV S prime:     15.90 cm/s TAPSE (M-mode): 2.6 cm LEFT ATRIUM             Index       RIGHT ATRIUM           Index LA diam:        4.20 cm 1.62 cm/m  RA Area:     21.00 cm LA Vol (A2C):   78.4 ml 30.23 ml/m RA Volume:   63.80 ml  24.60 ml/m LA Vol (A4C):   30.4 ml 11.72 ml/m LA Biplane Vol: 52.0 ml 20.05 ml/m  AORTIC VALVE LVOT Vmax:   87.90 cm/s LVOT Vmean:  54.300 cm/s LVOT VTI:    0.177 m  AORTA Ao Root diam: 4.00 cm Ao Asc diam:  3.80 cm MITRAL VALVE MV Area (PHT): 3.03 cm    SHUNTS MV Decel Time: 250 msec    Systemic VTI:  0.18 m MV E velocity: 71.30 cm/s  Systemic Diam: 2.10 cm MV A velocity: 95.60 cm/s MV E/A ratio:  0.75 Buford Dresser MD Electronically signed by Buford Dresser MD Signature Date/Time: 09/27/2020/2:42:43 PM    Final      Labs:   Basic Metabolic Panel: Recent Labs  Lab 09/26/20 1743 09/28/20 0039 09/29/20 0121 09/29/20 1653 09/29/20 1654 09/29/20 1702 09/30/20 0016 10/01/20 0059  NA 138 138 138 138 139 137 139 135  K 4.1 3.2* 3.6  4.1 4.1 4.0 4.2 3.6  CL 94* 92* 94*  --   --   --  95* 91*  CO2 36* 34* 32  --   --   --  32 34*  GLUCOSE 123* 124* 114*  --   --   --  110* 117*  BUN 9 11 10   --   --   --  10 13  CREATININE 0.97 0.95 0.98  --   --   --  0.92 0.91  CALCIUM 9.5 9.0 9.1  --   --   --  8.9 9.1  MG  --   --  1.9  --   --   --   --  1.9  PHOS  --   --  4.0  --   --   --   --  4.7*   GFR Estimated Creatinine Clearance: 110.6 mL/min (by C-G formula based on SCr of 0.91 mg/dL). Liver Function Tests: Recent Labs  Lab 09/29/20 0121 10/01/20 0059  AST 28 20  ALT 16 15  ALKPHOS 66 60  BILITOT 0.7 0.6  PROT 6.9 6.4*  ALBUMIN 3.5 3.4*   No results for input(s): LIPASE, AMYLASE in the last 168 hours. No results for input(s): AMMONIA in the last 168 hours. Coagulation profile No results for input(s): INR, PROTIME in the last  168 hours.  CBC: Recent Labs  Lab 09/26/20 1743 09/29/20 0137 09/29/20 1653 09/29/20 1654 09/29/20 1702 09/30/20 0016 10/01/20 0059  WBC 7.2 7.3  --   --   --  6.9 7.1  HGB 11.1* 10.6* 11.9* 11.9* 11.2* 10.4* 10.2*  HCT 35.7* 33.6* 35.0* 35.0* 33.0* 33.0* 32.4*  MCV 90.4 89.4  --   --   --  89.7 89.8  PLT 257 250  --   --   --  226 219   Cardiac Enzymes: No results for input(s): CKTOTAL, CKMB, CKMBINDEX, TROPONINI in the last 168 hours. BNP: Invalid input(s): POCBNP CBG: Recent Labs  Lab 09/30/20 1147 09/30/20 1620 09/30/20 2111 10/01/20 0611 10/01/20 1136  GLUCAP 131* 131* 107* 116* 128*   D-Dimer No results for input(s): DDIMER in the last 72 hours. Hgb A1c No results for input(s): HGBA1C in the last 72 hours. Lipid Profile No results for input(s): CHOL, HDL, LDLCALC, TRIG, CHOLHDL, LDLDIRECT in the last 72 hours. Thyroid function studies No results for input(s): TSH, T4TOTAL, T3FREE, THYROIDAB in the last 72 hours.  Invalid input(s): FREET3 Anemia work up No results for input(s): VITAMINB12, FOLATE, FERRITIN, TIBC, IRON, RETICCTPCT in the last 72  hours. Microbiology Recent Results (from the past 240 hour(s))  Resp Panel by RT-PCR (Flu A&B, Covid) Nasopharyngeal Swab     Status: None   Collection Time: 09/26/20  8:18 PM   Specimen: Nasopharyngeal Swab; Nasopharyngeal(NP) swabs in vial transport medium  Result Value Ref Range Status   SARS Coronavirus 2 by RT PCR NEGATIVE NEGATIVE Final    Comment: (NOTE) SARS-CoV-2 target nucleic acids are NOT DETECTED.  The SARS-CoV-2 RNA is generally detectable in upper respiratory specimens during the acute phase of infection. The lowest concentration of SARS-CoV-2 viral copies this assay can detect is 138 copies/mL. A negative result does not preclude SARS-Cov-2 infection and should not be used as the sole basis for treatment or other patient management decisions. A negative result may occur with  improper specimen collection/handling, submission of specimen other than nasopharyngeal swab, presence of viral mutation(s) within the areas targeted by this assay, and inadequate number of viral copies(<138 copies/mL). A negative result must be combined with clinical observations, patient history, and epidemiological information. The expected result is Negative.  Fact Sheet for Patients:  EntrepreneurPulse.com.au  Fact Sheet for Healthcare Providers:  IncredibleEmployment.be  This test is no t yet approved or cleared by the [place] FDA and  has been authorized for detection and/or diagnosis of SARS-CoV-2 by FDA under an Emergency Use Authorization (EUA). This EUA will remain  in effect (meaning this test can be used) for the duration of the COVID-19 declaration under Section 564(b)(1) of the Act, 21 U.S.C.section 360bbb-3(b)(1), unless the authorization is terminated  or revoked sooner.       Influenza A by PCR NEGATIVE NEGATIVE Final   Influenza B by PCR NEGATIVE NEGATIVE Final    Comment: (NOTE) The Xpert Xpress SARS-CoV-2/FLU/RSV plus assay  is intended as an aid in the diagnosis of influenza from Nasopharyngeal swab specimens and should not be used as a sole basis for treatment. Nasal washings and aspirates are unacceptable for Xpert Xpress SARS-CoV-2/FLU/RSV testing.  Fact Sheet for Patients: EntrepreneurPulse.com.au  Fact Sheet for Healthcare Providers: IncredibleEmployment.be  This test is not yet approved or cleared by the [place] FDA and has been authorized for detection and/or diagnosis of SARS-CoV-2 by FDA under an Emergency Use Authorization (EUA). This EUA will remain in effect (meaning this  test can be used) for the duration of the COVID-19 declaration under Section 564(b)(1) of the Act, 21 U.S.C. section 360bbb-3(b)(1), unless the authorization is terminated or revoked.  Performed at [facility], [place] [address], [place], [place] [address]   MRSA PCR Screening     Status: None   Collection Time: 09/27/20  5:21 PM   Specimen: Nasopharyngeal  Result Value Ref Range Status   MRSA by PCR NEGATIVE NEGATIVE Final    Comment:        The GeneXpert MRSA Assay (FDA approved for NASAL specimens only), is one component of a comprehensive MRSA colonization surveillance program. It is not intended to diagnose MRSA infection nor to guide or monitor treatment for MRSA infections. Performed at [facility], [place] [address], [place], [place] [address]   Expectorated Sputum Assessment w Gram Stain, Rflx to Resp Cult     Status: None   Collection Time: 09/30/20 10:19 AM   Specimen: Expectorated Sputum  Result Value Ref Range Status   Specimen Description EXPECTORATED SPUTUM  Final   Special Requests NONE  Final   Sputum evaluation   Final    Sputum specimen not acceptable for testing.  Please recollect.   RESULT CALLED TO, READ BACK BY AND VERIFIED WITHCharlean Sanfilippo RN 1630 09/30/20 A BROWNING Performed at [facility], [place] [address], [place], [place]  [address]    Report Status 09/30/2020 FINAL  Final     Discharge Instructions:   Discharge Instructions     (HEART FAILURE PATIENTS) Call MD:  Anytime you have any of the following symptoms: 1) 3 pound weight gain in 24 hours or 5 pounds in 1 week 2) shortness of breath, with or without a dry hacking cough 3) swelling in the hands, feet or stomach 4) if you have to sleep on extra pillows at night in order to breathe.   Complete by: As directed    Call MD for:  temperature >100.4   Complete by: As directed    Diet - low sodium heart healthy   Complete by: As directed    Discharge instructions   Complete by: As directed    Follow up with your primary care physician in one week. Follow up with your pulmonary physician in 1-2 weeks. You will benefit from losing more weight.  Please discuss with your primary care physician about it.  Follow-up with Dr. Melvyn Novas, pulmonary in 1 to 2 weeks.  Follow-up with Dr. Alvester Chou cardiology in 2 to 4 weeks.  Cardiology clinic to set up an appointment.  No overexertion.  Please use oxygen at 5 L/min when walking.  Discuss about outpatient sleep study with your primary care physician/Dr Wert. Take lasix everyday   Increase activity slowly   Complete by: As directed       Allergies as of 10/02/2020       Reactions   Ciprofloxacin Anaphylaxis   Penicillins Rash, Other (See Comments)   UNSPECIFIED SEVERITY Has patient had a PCN reaction causing immediate rash, facial/tongue/throat swelling, SOB or lightheadedness with hypotension:unsure Has patient had a PCN reaction causing severe rash involving mucus membranes or skin necrosis:unsure Has patient had a PCN reaction that required hospitalization:unsure Has patient had a PCN reaction occurring within the last 10 years:NO   Other Other (See Comments)   Penicillin G Other (See Comments)   Tramadol Rash   Tramadol Hcl Rash        Medication List  TAKE these medications    acetaminophen 650 MG CR  tablet Commonly known as: TYLENOL Take 650 mg by mouth every 8 (eight) hours as needed for pain.   albuterol 108 (90 Base) MCG/ACT inhaler Commonly known as: ProAir HFA 2 puffs every 4 hours as needed only  if your can't catch your breath What changed:   how much to take  how to take this  when to take this  reasons to take this  additional instructions   albuterol 108 (90 Base) MCG/ACT inhaler Commonly known as: VENTOLIN HFA Inhale 2 puffs into the lungs every 6 (six) hours as needed for wheezing or shortness of breath. What changed: You were already taking a medication with the same name, and this prescription was added. Make sure you understand how and when to take each.   aspirin EC 81 MG tablet Take 81 mg by mouth daily.   atorvastatin 80 MG tablet Commonly known as: LIPITOR TAKE 1 TABLET BY MOUTH EVERY DAY AT 6PM What changed: See the new instructions.   bisacodyl 10 MG suppository Commonly known as: DULCOLAX Place 10 mg rectally daily as needed for moderate constipation.   Breztri Aerosphere 160-9-4.8 MCG/ACT Aero Generic drug: Budeson-Glycopyrrol-Formoterol Inhale 2 puffs into the lungs 2 (two) times daily.   calcium carbonate 500 MG chewable tablet Commonly known as: TUMS - dosed in mg elemental calcium Chew 1-2 tablets by mouth 3 (three) times daily as needed for indigestion or heartburn.   cholecalciferol 1000 units tablet Commonly known as: VITAMIN D Take 1,000 Units by mouth daily.   clopidogrel 75 MG tablet Commonly known as: PLAVIX TAKE 1 TABLET BY MOUTH EVERY DAY   dutasteride 0.5 MG capsule Commonly known as: AVODART Take 0.5 mg by mouth at bedtime.   escitalopram 20 MG tablet Commonly known as: LEXAPRO Take 20 mg by mouth daily.   ferrous sulfate 325 (65 FE) MG tablet Take 325 mg by mouth daily.   furosemide 20 MG tablet Commonly known as: LASIX Take 2 tablets (40 mg total) by mouth daily. What changed: when to take this    HYDROmorphone 8 MG tablet Commonly known as: DILAUDID Take 8 mg by mouth every 6 (six) hours as needed for severe pain.   losartan 25 MG tablet Commonly known as: COZAAR TAKE 1 TABLET BY MOUTH EVERY DAY   metoprolol tartrate 25 MG tablet Commonly known as: LOPRESSOR Take 1 tablet (25 mg total) by mouth 2 (two) times daily.   multivitamin with minerals Tabs tablet Take 1 tablet by mouth daily. FOR SENIORS   nitroGLYCERIN 0.4 MG SL tablet Commonly known as: NITROSTAT DISSOLVE 1 TABLET UNDER TONGUE EVERY 5 MINS AS NEEDED FOR CHEST PAIN What changed: See the new instructions.   nortriptyline 10 MG capsule Commonly known as: PAMELOR Take 40 mg by mouth at bedtime.   ondansetron 4 MG tablet Commonly known as: ZOFRAN Take 4 mg by mouth every 8 (eight) hours as needed for nausea or vomiting.   pantoprazole 40 MG tablet Commonly known as: PROTONIX Take 1 tablet (40 mg total) by mouth 2 (two) times daily.   polyethylene glycol 17 g packet Commonly known as: MIRALAX / GLYCOLAX Take 17 g by mouth every evening.   potassium chloride 10 MEQ tablet Commonly known as: KLOR-CON Take 1 tablet (10 mEq total) by mouth every other day. What changed: when to take this   senna 8.6 MG Tabs tablet Commonly known as: SENOKOT Take 2 tablets by mouth 2 (two) times  daily.   sertraline 50 MG tablet Commonly known as: ZOLOFT Take 50 mg by mouth daily.   silodosin 8 MG Caps capsule Commonly known as: RAPAFLO Take 1 capsule (8 mg total) by mouth daily with breakfast.   SUMAtriptan 100 MG tablet Commonly known as: IMITREX Take 100 mg by mouth every 2 (two) hours as needed for migraine. May repeat in 2 hours if headache persists or recurs.   tamsulosin 0.4 MG Caps capsule Commonly known as: FLOMAX Take 0.8 mg by mouth daily.   tiZANidine 4 MG tablet Commonly known as: ZANAFLEX Take 4 mg by mouth every 6 (six) hours as needed for muscle spasms.   traZODone 150 MG tablet Commonly known  as: DESYREL Take 300 mg by mouth at bedtime.   VISINE OP Place 1 drop into both eyes 4 (four) times daily as needed (dry eyes).   vitamin C 500 MG tablet Commonly known as: ASCORBIC ACID Take 500 mg by mouth daily.               Durable Medical Equipment  (From admission, onward)           Start     Ordered   10/02/20 0708  For home use only DME oxygen  Once       Question Answer Comment  Length of Need Lifetime   Mode or (Route) Nasal cannula   Liters per Minute 5   Frequency Continuous (stationary and portable oxygen unit needed)   Oxygen conserving device Yes   Oxygen delivery system Gas      10/02/20 0708            Follow-up Information     Aletha Halim., PA-C.   Specialty: Family Medicine Why: 10/11/20 @ 1:50PM Contact information: [place] [address] [phone]         Home, [facility] Follow up.   Specialty: [place] Why: [place], [place] information: [address] [place] [address] [phone]                  Time coordinating discharge: 39 minutes  Signed:  Zemirah Krasinski  [facility] 10/02/2020, 9:58 AM

## 2020-10-02 NOTE — Progress Notes (Signed)
Discharged home accompanied by daughter, Belongings taken home.

## 2020-10-02 NOTE — Progress Notes (Signed)
All set for discharge home,awaiting o2 tank for transport  from home. Daughter at bedside to take him home.

## 2020-10-24 ENCOUNTER — Inpatient Hospital Stay: Payer: Medicare HMO | Admitting: Primary Care
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
## 2020-10-31 ENCOUNTER — Inpatient Hospital Stay: Payer: Medicare HMO | Admitting: Primary Care
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
## 2020-11-14 ENCOUNTER — Other Ambulatory Visit: Payer: Self-pay

## 2020-11-14 ENCOUNTER — Ambulatory Visit: Payer: Medicare HMO | Admitting: Student

## 2020-11-14 ENCOUNTER — Encounter: Payer: Self-pay | Admitting: Student

## 2020-11-14 VITALS — BP 118/74 | HR 98 | Ht 73.0 in | Wt 281.6 lb

## 2020-11-14 DIAGNOSIS — J449 Chronic obstructive pulmonary disease, unspecified: Secondary | ICD-10-CM

## 2020-11-14 DIAGNOSIS — G4733 Obstructive sleep apnea (adult) (pediatric): Secondary | ICD-10-CM

## 2020-11-14 DIAGNOSIS — I2510 Atherosclerotic heart disease of native coronary artery without angina pectoris: Secondary | ICD-10-CM | POA: Diagnosis not present

## 2020-11-14 DIAGNOSIS — E785 Hyperlipidemia, unspecified: Secondary | ICD-10-CM

## 2020-11-14 DIAGNOSIS — I5032 Chronic diastolic (congestive) heart failure: Secondary | ICD-10-CM | POA: Diagnosis not present

## 2020-11-14 DIAGNOSIS — J9611 Chronic respiratory failure with hypoxia: Secondary | ICD-10-CM

## 2020-11-14 DIAGNOSIS — I10 Essential (primary) hypertension: Secondary | ICD-10-CM

## 2020-11-14 DIAGNOSIS — I272 Pulmonary hypertension, unspecified: Secondary | ICD-10-CM

## 2020-11-14 MED ORDER — METOPROLOL TARTRATE 25 MG PO TABS: 25.0000 mg | ORAL_TABLET | Freq: Two times a day (BID) | ORAL | 3 refills | Status: DC

## 2020-11-14 NOTE — Patient Instructions (Signed)
Medication Instructions:  Continue current medications  *If you need a refill on your cardiac medications before your next appointment, please call your pharmacy*   Lab Work: None Ordered   Testing/Procedures: None Ordered   Follow-Up: At [facility], you and your health needs are our priority.  As part of our continuing mission to provide you with exceptional heart care, we have created designated Provider Care Teams.  These Care Teams include your primary Cardiologist (physician) and Advanced Practice Providers (APPs -  Physician Assistants and Nurse Practitioners) who all work together to provide you with the care you need, when you need it.  We recommend signing up for the patient portal called "MyChart".  Sign up information is provided on this After Visit Summary.  MyChart is used to connect with patients for Virtual Visits (Telemedicine).  Patients are able to view lab/test results, encounter notes, upcoming appointments, etc.  Non-urgent messages can be sent to your provider as well.   To learn more about what you can do with MyChart, go to [email or web address].    Your next appointment:   3-4 month(s)  The format for your next appointment:   In Person  Provider:   You may see Quay Burow, MD or one of the following Advanced Practice Providers on your designated Care Team:    [place], PA-C  Coletta Memos, FNP

## 2020-11-27 ENCOUNTER — Other Ambulatory Visit: Payer: Self-pay | Admitting: Cardiovascular Disease

## 2020-12-06 ENCOUNTER — Telehealth: Payer: Self-pay | Admitting: Internal Medicine

## 2020-12-06 MED ORDER — BREZTRI AEROSPHERE 160-9-4.8 MCG/ACT IN AERO: 2.0000 | INHALATION_SPRAY | Freq: Two times a day (BID) | RESPIRATORY_TRACT | 5 refills | Status: DC

## 2020-12-06 NOTE — Telephone Encounter (Signed)
Called and spoke to pt. Pt is requesting new script of Breztri to be sent to the [facility] pharmacy. Rx sent to preferred pharmacy. Pt verbalized understanding and denied any further questions or concerns at this time.

## 2020-12-12 ENCOUNTER — Other Ambulatory Visit: Payer: Self-pay

## 2020-12-12 ENCOUNTER — Ambulatory Visit: Payer: Medicare HMO | Admitting: Acute Care

## 2020-12-12 ENCOUNTER — Encounter: Payer: Self-pay | Admitting: Primary Care

## 2020-12-12 ENCOUNTER — Ambulatory Visit: Payer: Medicare HMO | Admitting: Primary Care

## 2020-12-12 DIAGNOSIS — J449 Chronic obstructive pulmonary disease, unspecified: Secondary | ICD-10-CM

## 2020-12-12 DIAGNOSIS — J9611 Chronic respiratory failure with hypoxia: Secondary | ICD-10-CM | POA: Diagnosis not present

## 2020-12-12 DIAGNOSIS — J9612 Chronic respiratory failure with hypercapnia: Secondary | ICD-10-CM

## 2020-12-12 MED ORDER — DOXYCYCLINE HYCLATE 100 MG PO TABS: 100.0000 mg | ORAL_TABLET | Freq: Two times a day (BID) | ORAL | 0 refills | Status: DC

## 2020-12-12 NOTE — Assessment & Plan Note (Signed)
-   Continues to benefit from 2L supplemental oxygen  
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
## 2020-12-12 NOTE — Patient Instructions (Signed)
Nice seeing you today Mr [place] Treating you for COPD exacerbation/bronchitis  Recommendations: Continue Breztri two puffs twice daily (rinse mouth after use) Use albuterol (ventolin) rescue inhaler 2 puffs every 4-6 hours for breakthrough shortness of breath Take Mucinex 600mg  tablet twice a day as needed to loose phlegm  Call if symptoms do not improve or worsen   RX: Doxycycline 100mg  twice daily x 7 days (take with food and wear sunscreen when outside)  Follow-up August with Dr. Melvyn Novas

## 2020-12-12 NOTE — Assessment & Plan Note (Addendum)
-   Acute exacerbation of chronic bronchitis. He has had a productive cough with yellow mucus x 2 weeks. Occasional chest tightness/wheezing which is relived with SABA. Sending in Rx for Doxycycline 100mg  BID x 7 days. Recommend he take mucinex 600mg  twice daily. Continue Breztri Aerosphere two puffs twice daily and Ventolin hfa 2 puffs q 6 hours. Advised to call our office if no improvement. Due for follow-up in August with Dr. Melvyn Novas.

## 2020-12-15 ENCOUNTER — Telehealth: Payer: Self-pay | Admitting: Cardiovascular Disease

## 2020-12-15 NOTE — Telephone Encounter (Signed)
Patient wanted to know if it was okay to go ahead schedule his hip replacement surgery

## 2020-12-15 NOTE — Telephone Encounter (Signed)
Returned call to patient who states that he would like to see what Dr. Gwenlyn Found thought about him having his hip replaced. Advised patient to have surgeon send our office a surgical clearance form.    Patient states he will contact them for clearance. Advised patient that once clearance is reviewed by our pre op team someone will get back in touch with him. Advised patient to call back to office with any issues, questions, or concerns. Patient verbalized understanding.

## 2021-01-21 ENCOUNTER — Other Ambulatory Visit: Payer: Self-pay | Admitting: Cardiovascular Disease

## 2021-01-30 ENCOUNTER — Other Ambulatory Visit: Payer: Self-pay | Admitting: Cardiovascular Disease

## 2021-03-13 ENCOUNTER — Ambulatory Visit: Payer: Medicare HMO | Admitting: Internal Medicine

## 2021-03-17 ENCOUNTER — Ambulatory Visit: Payer: Medicare HMO | Admitting: Cardiovascular Disease

## 2021-03-20 ENCOUNTER — Ambulatory Visit: Payer: Medicare HMO | Admitting: Internal Medicine
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
## 2021-04-11 ENCOUNTER — Telehealth: Payer: Self-pay | Admitting: Internal Medicine

## 2021-04-12 NOTE — Telephone Encounter (Signed)
Called patient but he did not answer. Left message for him to call back.  

## 2021-04-18 ENCOUNTER — Ambulatory Visit: Payer: Medicare HMO | Admitting: Adult Health

## 2021-04-20 NOTE — Telephone Encounter (Signed)
Called pt. Line rang and then disconnected. WCB.

## 2021-04-21 MED ORDER — BREZTRI AEROSPHERE 160-9-4.8 MCG/ACT IN AERO: 2.0000 | INHALATION_SPRAY | Freq: Two times a day (BID) | RESPIRATORY_TRACT | 5 refills | Status: DC

## 2021-04-21 NOTE — Telephone Encounter (Signed)
Call made to patient, confirmed DOB. Made aware the sample will be placed upfront as well as patient assistance application. Patient states the Judithann Sauger is $90.   Aware to fill out application and get it back to use.   Nothing further needed at this time.

## 2021-06-27 ENCOUNTER — Encounter: Payer: Medicare HMO | Admitting: Thoracic Surgery (Cardiothoracic Vascular Surgery)

## 2021-07-13 ENCOUNTER — Ambulatory Visit (INDEPENDENT_AMBULATORY_CARE_PROVIDER_SITE_OTHER): Payer: Medicare HMO

## 2021-07-13 ENCOUNTER — Ambulatory Visit: Payer: Medicare HMO | Admitting: Internal Medicine

## 2021-07-13 ENCOUNTER — Encounter: Payer: Self-pay | Admitting: Internal Medicine

## 2021-07-13 ENCOUNTER — Other Ambulatory Visit: Payer: Self-pay

## 2021-07-13 DIAGNOSIS — J449 Chronic obstructive pulmonary disease, unspecified: Secondary | ICD-10-CM

## 2021-07-13 DIAGNOSIS — J9611 Chronic respiratory failure with hypoxia: Secondary | ICD-10-CM

## 2021-07-13 DIAGNOSIS — J9612 Chronic respiratory failure with hypercapnia: Secondary | ICD-10-CM

## 2021-07-13 MED ORDER — BREZTRI AEROSPHERE 160-9-4.8 MCG/ACT IN AERO: 2.0000 | INHALATION_SPRAY | Freq: Two times a day (BID) | RESPIRATORY_TRACT | 0 refills | Status: DC

## 2021-07-13 NOTE — Patient Instructions (Addendum)
Please remember to go to the x-ray department  for your tests - we will call you with the results when they are available.  Ok to try albuterol 15 min before an activity (on alternating days)  that you know would usually make you short of breath and see if it makes any difference and if makes none then don't take albuterol after activity unless you can't catch your breath as this means it's the resting that helps, not the albuterol.     Make sure you check your oxygen saturation  AT  your highest level of activity (not after you stop)   to be sure it stays over 90% and adjust  02 flow upward to maintain this level if needed but remember to turn it back to previous settings when you stop (to conserve your supply).   Work on inhaler technique:  relax and gently blow all the way out then take a nice smooth full deep breath back in, triggering the inhaler at same time you start breathing in.  Hold for up to 5 seconds if you can. Blow breztri out thru nose. Rinse and gargle with water when done.  If mouth or throat bother you at all,  try brushing teeth/gums/tongue with arm and hammer toothpaste/ make a slurry and gargle and spit out.       Please schedule a follow up visit in 6 months but call sooner if needed
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
## 2021-07-14 ENCOUNTER — Encounter: Payer: Self-pay | Admitting: Internal Medicine

## 2021-07-14 NOTE — Assessment & Plan Note (Signed)
Newly qualified for 02 09/08/19  - ONO RA 08/16/19  desat < 88% x 39 min , 40 sec or 13.77% of study so rec 2lpm hs  - 09/08/2019- deat <88%RA x1.5 Laps (approx 350 ft) stopped d/t fatigue. Needs 2L day time oxygen; continue 2L nocturnal oxygen - 11/19/2019 after 1/2 lap o2 sat decreased to 88%ra--placed pt on POC and sats increased to 95 on 2lpm pulsed o2--walked rest of the lap and sats at end were 94% 2lpm//pt walked average pace with walker/stopped multiple times to rest due to legs weak   - HC03  06/09/20   = 33  09/23/2020   Walked on RA x 100 ft desats then  X completed one lap =  approx 250 ft -@ slow pace on 2lpm continuous stopped due to sob with sats still 92%  - HC03   10/01/20  = 34   Advised:. Make sure you check your oxygen saturation  AT  your highest level of activity (not after you stop)   to be sure it stays over 90% and adjust  02 flow upward to maintain this level if needed but remember to turn it back to previous settings when you stop (to conserve your supply).   He is at mild risk of post op hypoxemia or hypercabia esp if lying flat as likely has component of OHS but should be easy to bridge from OR to floor with bipap and pulmonary f/u as inpt as needed so I cleared for surgery today with caveat he min narcs and max mobilization post op.        Medical decision making was  A moderate level of complexity in this case because of multiple chronic conditions/diagnosess  Req chart review, counseling,  and generating customized AVS unique to this office visit and charting.   Each respiratory  maintenance medication was reviewed in detail including emphasizing most importantly the difference between maintenance and prns and under what circumstances the prns are to be triggered using an action plan format where appropriate. Please see avs for details which were reviewed in writing by both me and my nurse and patient given a written copy highlighted where appropriate with yellow highlighter  for the patient's continued care at home along with an updated version of their medications.  Patient was asked to maintain medication reconciliation by comparing this list to the actual medications being used at home and to contact this office right away if there is a conflict or discrepancy.

## 2021-07-14 NOTE — Assessment & Plan Note (Signed)
Restrictive pfts 07/29/2019 with erv 55% c/w obesity effects on lung volumes  Body mass index is 38.79 kg/m.  -  trending down Lab Results  Component Value Date   TSH 2.75 02/03/2019      Contributing to doe and risk of GERD >>>   reviewed the need and the process to achieve and maintain neg calorie balance > defer f/u primary care including intermittently monitoring thyroid status

## 2021-07-14 NOTE — Assessment & Plan Note (Signed)
Quit smoking 2016 - PFT's  07/29/2019  FEV1 1.80 (54 % ) ratio 0.67  p 3 % improvement from saba p 0 prior to study with DLCO  17.61 (67%) corrects to 4.68 (114%)  for alv volume and FV curve classic curvature   -07/29/2019  try bevespi   > improved  - 07/13/2021  After extensive coaching inhaler device,  effectiveness =    80% from a baseline of < 50% so continue hfa   Group D in terms of symptom/risk and laba/lama/ICS  therefore appropriate rx at this point >>>  breztri 2bid  Re SABA :  I spent extra time with pt today reviewing appropriate use of albuterol for prn use on exertion with the following points: 1) saba is for relief of sob that does not improve by walking a slower pace or resting but rather if the pt does not improve after trying this first. 2) If the pt is convinced, as many are, that saba helps recover from activity faster then it's easy to tell if this is the case by re-challenging : ie stop, take the inhaler, then p 5 minutes try the exact same activity (intensity of workload) that just caused the symptoms and see if they are substantially diminished or not after saba 3) if there is an activity that reproducibly causes the symptoms, try the saba 15 min before the activity on alternate days   If in fact the saba really does help, then fine to continue to use it prn but advised may need to look closer at the maintenance regimen being used to achieve better control of airways disease with exertion.

## 2021-07-19 ENCOUNTER — Other Ambulatory Visit: Payer: Self-pay | Admitting: Cardiovascular Disease

## 2021-08-02 ENCOUNTER — Other Ambulatory Visit: Payer: Self-pay | Admitting: Orthopedic Surgery

## 2021-08-02 ENCOUNTER — Telehealth: Payer: Self-pay

## 2021-08-02 NOTE — Telephone Encounter (Signed)
° °  Pre-operative Risk Assessment    Patient Name: Jerry [name]  DOB: 27-Jan-1951 MRN: [id number]      Request for Surgical Clearance    Procedure:   Right anterior total hip arthoplasty  Date of Surgery:  Clearance 08/18/21                                 Surgeon:  [facility]  Surgeon's Group or Practice Name:  Dr. Dorna Leitz Phone number:  [phone] Fax number:  [phone]   Type of Clearance Requested:   - Medical  - Pharmacy:  Hold Aspirin and Clopidogrel (Plavix) Please provide protocol   Type of Anesthesia:  Spinal   Additional requests/questions:    SignedJacqulynn Cadet   08/02/2021, 3:55 PM

## 2021-08-02 NOTE — Telephone Encounter (Signed)
° °  Chart reviewed per preop protocol.  70 yo male with hx of remote stenting to OM1 in 1997 and subsequent PCI with DEs to mid LAD in 12/19.  He has known severe ISR in the [place] which is managed medically.  Other hx includes HFpEF, COPD with pulmonary hypertension and nocturnal hypoxia (followed by pulmonology; Dr. Melvyn Novas), hypertension, hyperlipidemia, borderline diabetes mellitus, obesity.  Cardiac catheterization in 2/22 demonstrated patent LAD stent and stable anatomy with 95% OM1 ISR managed medically.  Echocardiogram in 2/22 demonstrated EF 55-60.  Last seen in the office by Sande Rives, PA-C on 11/14/20.    Request is to hold ASA and Plavix for upcoming hip replacement.  Given time since last PCI, it should be ok to hold Plavix briefly for surgery.  However, given need to hold both antiplatelet agents, will as his primary cardiologist to review and make recommendations.  Then, we will need to call the patient to assess clinical status in order to complete his risk assessment.    Richardson Dopp, PA-C    08/02/2021 4:19 PM

## 2021-08-03 NOTE — Telephone Encounter (Signed)
° °  Primary Cardiologist: Quay Burow, MD  Chart reviewed as part of pre-operative protocol coverage. Given past medical history and time since last visit, based on ACC/AHA guidelines, ARLANDER GILLEN would be at acceptable risk for the planned procedure without further cardiovascular testing.   RCRI indicates class II risk, 6.6% risk of major cardiac event. METS >4, though his activity is somewhat limited due to orthopedic issues, including chronic back pain.  Patient was advised that if he develops new symptoms prior to surgery to contact our office to arrange a follow-up appointment.  He verbalized understanding.  Per Dr. Gwenlyn Found, patient may hold Aspirin and Plavix 5-7 days prior to surgery. Please resume Aspirin and Plavix as soon as hemostasis achieved, at the discretion of the surgeon.   I will route this recommendation to the requesting party via Epic fax function and remove from pre-op pool.  Please call with questions.  Lenna Sciara, NP 08/03/2021, 9:01 AM

## 2021-08-10 ENCOUNTER — Telehealth: Payer: Self-pay | Admitting: Internal Medicine

## 2021-08-10 NOTE — Telephone Encounter (Signed)
Ov notes and clearance form have been faxed back to [facility]. Nothing further needed at this time.

## 2021-08-10 NOTE — Telephone Encounter (Signed)
Fax received from Dr. Dorna Leitz with [facility] to perform a Right anterior total hip arthroplasty on patient.  Patient needs surgery clearance. Patient was seen on 07/13/2021. Office protocol is a risk assessment can be sent to surgeon if patient has been seen in 60 days or less.   Sending to Dr. Melvyn Novas for risk assessment

## 2021-08-11 NOTE — Patient Instructions (Addendum)
DUE TO COVID-19 ONLY ONE VISITOR IS ALLOWED TO COME WITH YOU AND STAY IN THE WAITING ROOM ONLY DURING PRE OP AND PROCEDURE.   **NO VISITORS ARE ALLOWED IN THE SHORT STAY AREA OR RECOVERY ROOM!!**       Your procedure is scheduled on: 08/18/21   Report to [facility] Main Entrance    Report to admitting at 5:15 AM   Call this number if you have problems the morning of surgery [phone]   Do not eat food :After Midnight.   May have liquids until 4:15 AM day of surgery  CLEAR LIQUID DIET  Foods Allowed                                                                     Foods Excluded  Water, Black Coffee and tea (no milk or creamer)           liquids that you cannot  Plain Jell-O in any flavor  (No red)                                    see through such as: Fruit ices (not with fruit pulp)                                            milk, soups, orange juice              Iced Popsicles (No red)                                               All solid food                                   Apple juices Sports drinks like Gatorade (No red) Lightly seasoned clear broth or consume(fat free) Sugar     The day of surgery:  Drink ONE (1) Pre-Surgery G2 by 4:15 am the morning of surgery. Drink in one sitting. Do not sip.  This drink was given to you during your hospital  pre-op appointment visit. Nothing else to drink after completing the  Pre-Surgery G2.          If you have questions, please contact your surgeons office.     Oral Hygiene is also important to reduce your risk of infection.                                    Remember - BRUSH YOUR TEETH THE MORNING OF SURGERY WITH YOUR REGULAR TOOTHPASTE   Follow instructions given to you regarding stopping Plavix and Aspirin before surgery.   Stop all vitamins and supplements 7 days before surgery   Take these medicines the morning of surgery with A SIP OF WATER: Tylenol, Inhalers, Dilaudid, Metoprolol, Protonix,  Flomax  You may not have any metal on your body including jewelry, and body piercing             Do not wear lotions, powders, cologne, or deodorant              Men may shave face and neck.   Do not bring valuables to the hospital. [place].   Contacts, dentures or bridgework may not be worn into surgery.    Patients discharged on the day of surgery will not be allowed to drive home.  We recommend you have a responsible adult to stay with you for the first 24 hours after anesthesia.   Special Instructions: Bring a copy of your healthcare power of attorney and living will documents         the day of surgery if you haven't scanned them before.              Please read over the following fact sheets you were given: IF YOU HAVE QUESTIONS ABOUT YOUR PRE-OP INSTRUCTIONS PLEASE CALL [facility] - Preparing for Surgery Before surgery, you can play an important role.  Because skin is not sterile, your skin needs to be as free of germs as possible.  You can reduce the number of germs on your skin by washing with CHG (chlorahexidine gluconate) soap before surgery.  CHG is an antiseptic cleaner which kills germs and bonds with the skin to continue killing germs even after washing. Please DO NOT use if you have an allergy to CHG or antibacterial soaps.  If your skin becomes reddened/irritated stop using the CHG and inform your nurse when you arrive at Short Stay. Do not shave (including legs and underarms) for at least 48 hours prior to the first CHG shower.  You may shave your face/neck.  Please follow these instructions carefully:  1.  Shower with CHG Soap the night before surgery and the  morning of surgery.  2.  If you choose to wash your hair, wash your hair first as usual with your normal  shampoo.  3.  After you shampoo, rinse your hair and body thoroughly to remove the shampoo.                              4.  Use CHG as you would any other liquid soap.  You can apply chg directly to the skin and wash.  Gently with a scrungie or clean washcloth.  5.  Apply the CHG Soap to your body ONLY FROM THE NECK DOWN.   Do   not use on face/ open                           Wound or open sores. Avoid contact with eyes, ears mouth and   genitals (private parts).                       Wash face,  Genitals (private parts) with your normal soap.             6.  Wash thoroughly, paying special attention to the area where your    surgery  will be performed.  7.  Thoroughly rinse your body with warm water from the neck down.  8.  DO NOT shower/wash with your normal soap after using and rinsing off the CHG Soap.                9.  Pat yourself dry with a clean towel.            10.  Wear clean pajamas.            11.  Place clean sheets on your bed the night of your first shower and do not  sleep with pets. Day of Surgery : Do not apply any lotions/deodorants the morning of surgery.  Please wear clean clothes to the hospital/surgery center.  FAILURE TO FOLLOW THESE INSTRUCTIONS MAY RESULT IN THE CANCELLATION OF YOUR SURGERY  PATIENT SIGNATURE_________________________________  NURSE SIGNATURE__________________________________  ________________________________________________________________________   Jerry [name]  An incentive spirometer is a tool that can help keep your lungs clear and active. This tool measures how well you are filling your lungs with each breath. Taking long deep breaths may help reverse or decrease the chance of developing breathing (pulmonary) problems (especially infection) following: A long period of time when you are unable to move or be active. BEFORE THE PROCEDURE  If the spirometer includes an indicator to show your best effort, your nurse or respiratory therapist will set it to a desired goal. If possible, sit up straight or lean slightly forward. Try not to  slouch. Hold the incentive spirometer in an upright position. INSTRUCTIONS FOR USE  Sit on the edge of your bed if possible, or sit up as far as you can in bed or on a chair. Hold the incentive spirometer in an upright position. Breathe out normally. Place the mouthpiece in your mouth and seal your lips tightly around it. Breathe in slowly and as deeply as possible, raising the piston or the ball toward the top of the column. Hold your breath for 3-5 seconds or for as long as possible. Allow the piston or ball to fall to the bottom of the column. Remove the mouthpiece from your mouth and breathe out normally. Rest for a few seconds and repeat Steps 1 through 7 at least 10 times every 1-2 hours when you are awake. Take your time and take a few normal breaths between deep breaths. The spirometer may include an indicator to show your best effort. Use the indicator as a goal to work toward during each repetition. After each set of 10 deep breaths, practice coughing to be sure your lungs are clear. If you have an incision (the cut made at the time of surgery), support your incision when coughing by placing a pillow or rolled up towels firmly against it. Once you are able to get out of bed, walk around indoors and cough well. You may stop using the incentive spirometer when instructed by your caregiver.  RISKS AND COMPLICATIONS Take your time so you do not get dizzy or light-headed. If you are in pain, you may need to take or ask for pain medication before doing incentive spirometry. It is harder to take a deep breath if you are having pain. AFTER USE Rest and breathe slowly and easily. It can be helpful to keep track of a log of your progress. Your caregiver can provide you with a simple table to help with this. If you are using the spirometer at home, follow these instructions: [place] IF:  You are having difficultly using the spirometer. You have trouble using the spirometer as often as  instructed. Your pain  medication is not giving enough relief while using the spirometer. You develop fever of 100.5 F (38.1 C) or higher. SEEK IMMEDIATE MEDICAL CARE IF:  You cough up bloody sputum that had not been present before. You develop fever of 102 F (38.9 C) or greater. You develop worsening pain at or near the incision site. MAKE SURE YOU:  Understand these instructions. Will watch your condition. Will get help right away if you are not doing well or get worse. Document Released: 12/03/2006 Document Revised: 10/15/2011 Document Reviewed: 02/03/2007 [employer] Patient Information 2014 [employer], [place].   ________________________________________________________________________  WHAT IS A BLOOD TRANSFUSION? Blood Transfusion Information  A transfusion is the replacement of blood or some of its parts. Blood is made up of multiple cells which provide different functions. Red blood cells carry oxygen and are used for blood loss replacement. White blood cells fight against infection. Platelets control bleeding. Plasma helps clot blood. Other blood products are available for specialized needs, such as hemophilia or other clotting disorders. BEFORE THE TRANSFUSION  Who gives blood for transfusions?  Healthy volunteers who are fully evaluated to make sure their blood is safe. This is blood bank blood. Transfusion therapy is the safest it has ever been in the practice of medicine. Before blood is taken from a donor, a complete history is taken to make sure that person has no history of diseases nor engages in risky social behavior (examples are intravenous drug use or sexual activity with multiple partners). The donor's travel history is screened to minimize risk of transmitting infections, such as malaria. The donated blood is tested for signs of infectious diseases, such as HIV and hepatitis. The blood is then tested to be sure it is compatible with you in order to minimize the chance of  a transfusion reaction. If you or a relative donates blood, this is often done in anticipation of surgery and is not appropriate for emergency situations. It takes many days to process the donated blood. RISKS AND COMPLICATIONS Although transfusion therapy is very safe and saves many lives, the main dangers of transfusion include:  Getting an infectious disease. Developing a transfusion reaction. This is an allergic reaction to something in the blood you were given. Every precaution is taken to prevent this. The decision to have a blood transfusion has been considered carefully by your caregiver before blood is given. Blood is not given unless the benefits outweigh the risks. AFTER THE TRANSFUSION Right after receiving a blood transfusion, you will usually feel much better and more energetic. This is especially true if your red blood cells have gotten low (anemic). The transfusion raises the level of the red blood cells which carry oxygen, and this usually causes an energy increase. The nurse administering the transfusion will monitor you carefully for complications. HOME CARE INSTRUCTIONS  No special instructions are needed after a transfusion. You may find your energy is better. Speak with your caregiver about any limitations on activity for underlying diseases you may have. SEEK MEDICAL CARE IF:  Your condition is not improving after your transfusion. You develop redness or irritation at the intravenous (IV) site. SEEK IMMEDIATE MEDICAL CARE IF:  Any of the following symptoms occur over the next 12 hours: Shaking chills. You have a temperature by mouth above 102 F (38.9 C), not controlled by medicine. Chest, back, or muscle pain. People around you feel you are not acting correctly or are confused. Shortness of breath or difficulty breathing. Dizziness and fainting. You get a rash or develop  hives. You have a decrease in urine output. Your urine turns a dark color or changes to pink, red,  or brown. Any of the following symptoms occur over the next 10 days: You have a temperature by mouth above 102 F (38.9 C), not controlled by medicine. Shortness of breath. Weakness after normal activity. The white part of the eye turns yellow (jaundice). You have a decrease in the amount of urine or are urinating less often. Your urine turns a dark color or changes to pink, red, or brown. Document Released: 07/20/2000 Document Revised: 10/15/2011 Document Reviewed: 03/08/2008 [facility] Patient Information 2014 [place], [place].  _______________________________________________________________________

## 2021-08-11 NOTE — Progress Notes (Addendum)
COVID swab appointment: n/a  COVID Vaccine Completed: no Date COVID Vaccine completed: Has received booster: COVID vaccine manufacturer: [place]   Date of COVID positive in last 90 days: no  PCP - Bing Matter, PA Cardiologist - Quay Burow, MD  Cardiac clearance 08/03/21 by Diona Browner in [place]  Chest x-ray - 07/13/21 Epic EKG - 09/27/20 Epic Stress Test - yes years ago per pt ECHO - 09/27/20 Epic Cardiac Cath - 09/29/20 Epic Pacemaker/ICD device last checked: n/a Spinal Cord Stimulator: n/a  Sleep Study - yes, negative  CPAP -   Fasting Blood Sugar - pre DM no checks at home Checks Blood Sugar _____ times a day  Blood Thinner Instructions: Plavix, hold 5-7 days Aspirin Instructions:ASA 81, hold 5-7 days Last Dose: 08/13/21  Activity level: Can perform activities of daily living without stopping and without symptoms of chest pain or shortness of breath. No stairs due to hip      Anesthesia review: CAD, HTN, CHF, COPD, DOE, pre DM, anemia, NSVT, 2L O2 continuous 3L with walking, osteosarcoma, intrathecal pump  Patient denies shortness of breath, fever, cough and chest pain at PAT appointment   Patient verbalized understanding of instructions that were given to them at the PAT appointment. Patient was also instructed that they will need to review over the PAT instructions again at home before surgery.

## 2021-08-14 ENCOUNTER — Other Ambulatory Visit: Payer: Self-pay

## 2021-08-14 ENCOUNTER — Encounter (HOSPITAL_COMMUNITY): Payer: Self-pay

## 2021-08-14 ENCOUNTER — Encounter (HOSPITAL_COMMUNITY)
Admission: RE | Admit: 2021-08-14 | Discharge: 2021-08-14 | Disposition: A | Discharge status: Home / Self Care | Payer: Medicare HMO | Source: Ambulatory Visit | Attending: Orthopedic Surgery | Admitting: Orthopedic Surgery

## 2021-08-14 VITALS — BP 129/73 | HR 62 | Temp 98.5°F | Resp 14 | Ht 73.0 in | Wt 279.8 lb

## 2021-08-14 DIAGNOSIS — M549 Dorsalgia, unspecified: Secondary | ICD-10-CM | POA: Insufficient documentation

## 2021-08-14 DIAGNOSIS — Z955 Presence of coronary angioplasty implant and graft: Secondary | ICD-10-CM | POA: Insufficient documentation

## 2021-08-14 DIAGNOSIS — Z9981 Dependence on supplemental oxygen: Secondary | ICD-10-CM | POA: Diagnosis not present

## 2021-08-14 DIAGNOSIS — Z7982 Long term (current) use of aspirin: Secondary | ICD-10-CM | POA: Insufficient documentation

## 2021-08-14 DIAGNOSIS — J449 Chronic obstructive pulmonary disease, unspecified: Secondary | ICD-10-CM | POA: Diagnosis not present

## 2021-08-14 DIAGNOSIS — M1611 Unilateral primary osteoarthritis, right hip: Secondary | ICD-10-CM | POA: Insufficient documentation

## 2021-08-14 DIAGNOSIS — I2510 Atherosclerotic heart disease of native coronary artery without angina pectoris: Secondary | ICD-10-CM | POA: Insufficient documentation

## 2021-08-14 DIAGNOSIS — G8929 Other chronic pain: Secondary | ICD-10-CM | POA: Diagnosis not present

## 2021-08-14 DIAGNOSIS — R7303 Prediabetes: Secondary | ICD-10-CM | POA: Insufficient documentation

## 2021-08-14 DIAGNOSIS — I110 Hypertensive heart disease with heart failure: Secondary | ICD-10-CM | POA: Diagnosis not present

## 2021-08-14 DIAGNOSIS — Z01818 Encounter for other preprocedural examination: Secondary | ICD-10-CM

## 2021-08-14 DIAGNOSIS — K219 Gastro-esophageal reflux disease without esophagitis: Secondary | ICD-10-CM | POA: Insufficient documentation

## 2021-08-14 DIAGNOSIS — I5032 Chronic diastolic (congestive) heart failure: Secondary | ICD-10-CM | POA: Diagnosis not present

## 2021-08-14 DIAGNOSIS — Z87891 Personal history of nicotine dependence: Secondary | ICD-10-CM | POA: Insufficient documentation

## 2021-08-14 DIAGNOSIS — Z7901 Long term (current) use of anticoagulants: Secondary | ICD-10-CM | POA: Insufficient documentation

## 2021-08-14 DIAGNOSIS — Z01812 Encounter for preprocedural laboratory examination: Secondary | ICD-10-CM | POA: Diagnosis not present

## 2021-08-14 DIAGNOSIS — I739 Peripheral vascular disease, unspecified: Secondary | ICD-10-CM | POA: Insufficient documentation

## 2021-08-14 DIAGNOSIS — I10 Essential (primary) hypertension: Secondary | ICD-10-CM

## 2021-08-14 LAB — BASIC METABOLIC PANEL
Anion gap: 9 (ref 5–15)
BUN: 11 mg/dL (ref 8–23)
CO2: 37 mmol/L — ABNORMAL HIGH (ref 22–32)
Calcium: 8.9 mg/dL (ref 8.9–10.3)
Chloride: 95 mmol/L — ABNORMAL LOW (ref 98–111)
Creatinine, Ser: 0.84 mg/dL (ref 0.61–1.24)
GFR, Estimated: >60 mL/min (ref >60)
Glucose, Bld: 136 mg/dL — ABNORMAL HIGH (ref 70–99)
Potassium: 3 mmol/L — ABNORMAL LOW (ref 3.5–5.1)
Sodium: 141 mmol/L (ref 135–145)

## 2021-08-14 LAB — CBC
HCT: 35.4 % — ABNORMAL LOW (ref 39.0–52.0)
Hemoglobin: 10.7 g/dL — ABNORMAL LOW (ref 13.0–17.0)
MCH: 27.2 pg (ref 26.0–34.0)
MCHC: 30.2 g/dL (ref 30.0–36.0)
MCV: 89.8 fL (ref 80.0–100.0)
Platelets: 214 10*3/uL (ref 150–400)
RBC: 3.94 MIL/uL — ABNORMAL LOW (ref 4.22–5.81)
RDW: 14.5 % (ref 11.5–15.5)
WBC: 7 10*3/uL (ref 4.0–10.5)
nRBC: 0 % (ref 0.0–0.2)

## 2021-08-14 LAB — GLUCOSE, CAPILLARY: Glucose-Capillary: 139 mg/dL — ABNORMAL HIGH (ref 70–99)

## 2021-08-14 LAB — SURGICAL PCR SCREEN
MRSA, PCR: NEGATIVE
Staphylococcus aureus: NEGATIVE

## 2021-08-14 LAB — HEMOGLOBIN A1C
Hgb A1c MFr Bld: 5.7 % — ABNORMAL HIGH (ref 4.8–5.6)
Mean Plasma Glucose: 116.89 mg/dL

## 2021-08-15 NOTE — Anesthesia Preprocedure Evaluation (Deleted)
Anesthesia Evaluation    Airway        Dental   Pulmonary former smoker,           Cardiovascular hypertension,      Neuro/Psych    GI/Hepatic   Endo/Other    Renal/GU      Musculoskeletal   Abdominal   Peds  Hematology   Anesthesia Other Findings   Reproductive/Obstetrics                             Anesthesia Physical Anesthesia Plan  ASA:   Anesthesia Plan:    Post-op Pain Management:    Induction:   PONV Risk Score and Plan:   Airway Management Planned:   Additional Equipment:   Intra-op Plan:   Post-operative Plan:   Informed Consent:   Plan Discussed with:   Anesthesia Plan Comments: (See PAT note 08/14/2021, Konrad Felix Ward, PA-C)        Anesthesia Quick Evaluation
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
## 2021-08-17 NOTE — Anesthesia Preprocedure Evaluation (Addendum)
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 on medications and Pt. on home beta blockers + CAD, + Past MI, + Cardiac Stents, + Peripheral Vascular Disease, +CHF and + DOE  Normal cardiovascular exam   Per cardiology preoperative evaluation 08/03/2021, "Chart reviewed as part of pre-operative protocol coverage. Given past medical history and time since last visit, based on ACC/AHA guidelines,Ashaad K Michaelwould be at acceptable risk for the planned procedure without further cardiovascular testing. RCRI indicates class II risk, 6.6% risk of major cardiac event. METS >4, though his activity is somewhat limited due to orthopedic issues, including chronic back pain. Patient was advised that if he develops new symptoms prior to surgery to contact our office to arrange a follow-up appointment. He verbalized understanding. Per Dr. Gwenlyn Found, patient may hold Aspirin and Plavix 5-7 days prior to surgery.Please resume Aspirin and Plavix as soon as hemostasis achieved, at the discretion of the surgeon. "  Echo 09/27/20 1. Left ventricular ejection fraction, by estimation, is 55 to 60%. The  left ventricle has normal function. The left ventricle demonstrates  regional wall motion abnormalities (see scoring  diagram/findings for  description). There is mild concentric left  ventricular hypertrophy. Left ventricular diastolic parameters are  indeterminate. There is hypokinesis of the left ventricular, basal  inferior wall.  2. Right ventricular systolic function is normal. The right ventricular  size is normal.  3. Left atrial size was mildly dilated.  4. The mitral valve is normal in structure. No evidence of mitral valve  regurgitation. No evidence of mitral stenosis.  5. The aortic valve has an indeterminant number of cusps. There is mild  calcification of the aortic valve. There is moderate thickening of the  aortic valve. Aortic valve regurgitation is trivial. Mild to moderate  aortic valve sclerosis/calcification is  present, without any evidence of aortic stenosis.  6. Aortic dilatation noted. There is borderline dilatation of the  ascending aorta, measuring 38 mm.   08/2019 ECHO: EF 55-60%, mild-mod aortic sclerosis without stenosis   Neuro/Psych  Headaches, PSYCHIATRIC DISORDERS Anxiety Depression Chronic back pain    GI/Hepatic hiatal hernia, GERD  Medicated and Controlled,  Endo/Other    Renal/GU      Musculoskeletal  (+) Arthritis , Chronic lower back pain   Abdominal (+) + obese,   Peds  Hematology  (+) anemia , HLD   Anesthesia Other Findings   Reproductive/Obstetrics                            Anesthesia Physical  Anesthesia Plan  ASA: 4  Anesthesia Plan: General   Post-op Pain Management: Celebrex PO (pre-op) and Tylenol PO (pre-op)   Induction: Intravenous  PONV Risk Score and Plan: 1 and Ondansetron, Dexamethasone, Treatment may vary due to age or medical condition and Propofol infusion  Airway  Management Planned: Oral ETT  Additional Equipment:   Intra-op Plan:   Post-operative Plan: Extubation in OR  Informed Consent: I have reviewed the patients History and Physical, chart, labs and discussed the procedure  including the risks, benefits and alternatives for the proposed anesthesia with the patient or authorized representative who has indicated his/her understanding and acceptance.     Dental advisory given  Plan Discussed with: Anesthesiologist and CRNA  Anesthesia Plan Comments: ( Pt did not hold plavix for 5 days: last dose on 1/10.)       Anesthesia Quick Evaluation
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
## 2021-08-18 ENCOUNTER — Ambulatory Visit (HOSPITAL_COMMUNITY): Payer: Medicare HMO

## 2021-08-18 ENCOUNTER — Inpatient Hospital Stay (HOSPITAL_COMMUNITY)
Admission: AD | Admit: 2021-08-18 | Discharge: 2021-08-22 | DRG: 470 | Disposition: A | Discharge status: Home Health | Payer: Medicare HMO | Attending: Orthopedic Surgery | Admitting: Orthopedic Surgery

## 2021-08-18 ENCOUNTER — Ambulatory Visit (HOSPITAL_COMMUNITY): Payer: Medicare HMO | Admitting: Anesthesiology

## 2021-08-18 ENCOUNTER — Encounter (HOSPITAL_COMMUNITY)
Admission: AD | Disposition: A | Discharge status: Home Health | Payer: Self-pay | Source: Home / Self Care | Attending: Orthopedic Surgery

## 2021-08-18 ENCOUNTER — Ambulatory Visit (HOSPITAL_COMMUNITY): Payer: Medicare HMO | Admitting: Physician Assistant

## 2021-08-18 ENCOUNTER — Other Ambulatory Visit: Payer: Self-pay

## 2021-08-18 ENCOUNTER — Encounter (HOSPITAL_COMMUNITY): Payer: Self-pay | Admitting: Orthopedic Surgery

## 2021-08-18 DIAGNOSIS — Z8249 Family history of ischemic heart disease and other diseases of the circulatory system: Secondary | ICD-10-CM

## 2021-08-18 DIAGNOSIS — Z880 Allergy status to penicillin: Secondary | ICD-10-CM

## 2021-08-18 DIAGNOSIS — I5032 Chronic diastolic (congestive) heart failure: Secondary | ICD-10-CM | POA: Diagnosis present

## 2021-08-18 DIAGNOSIS — D62 Acute posthemorrhagic anemia: Secondary | ICD-10-CM | POA: Diagnosis not present

## 2021-08-18 DIAGNOSIS — Z419 Encounter for procedure for purposes other than remedying health state, unspecified: Secondary | ICD-10-CM

## 2021-08-18 DIAGNOSIS — G894 Chronic pain syndrome: Secondary | ICD-10-CM | POA: Diagnosis present

## 2021-08-18 DIAGNOSIS — Z96641 Presence of right artificial hip joint: Secondary | ICD-10-CM

## 2021-08-18 DIAGNOSIS — Z881 Allergy status to other antibiotic agents status: Secondary | ICD-10-CM

## 2021-08-18 DIAGNOSIS — Z01818 Encounter for other preprocedural examination: Secondary | ICD-10-CM

## 2021-08-18 DIAGNOSIS — I110 Hypertensive heart disease with heart failure: Secondary | ICD-10-CM | POA: Diagnosis present

## 2021-08-18 DIAGNOSIS — M545 Low back pain, unspecified: Secondary | ICD-10-CM | POA: Diagnosis present

## 2021-08-18 DIAGNOSIS — Z87891 Personal history of nicotine dependence: Secondary | ICD-10-CM

## 2021-08-18 DIAGNOSIS — M1611 Unilateral primary osteoarthritis, right hip: Principal | ICD-10-CM | POA: Diagnosis present

## 2021-08-18 DIAGNOSIS — Z79899 Other long term (current) drug therapy: Secondary | ICD-10-CM

## 2021-08-18 DIAGNOSIS — I421 Obstructive hypertrophic cardiomyopathy: Secondary | ICD-10-CM | POA: Diagnosis present

## 2021-08-18 DIAGNOSIS — K219 Gastro-esophageal reflux disease without esophagitis: Secondary | ICD-10-CM | POA: Diagnosis present

## 2021-08-18 DIAGNOSIS — R7303 Prediabetes: Secondary | ICD-10-CM | POA: Diagnosis present

## 2021-08-18 DIAGNOSIS — Z7901 Long term (current) use of anticoagulants: Secondary | ICD-10-CM

## 2021-08-18 DIAGNOSIS — F32A Depression, unspecified: Secondary | ICD-10-CM | POA: Diagnosis present

## 2021-08-18 DIAGNOSIS — Z96649 Presence of unspecified artificial hip joint: Secondary | ICD-10-CM

## 2021-08-18 DIAGNOSIS — Z6836 Body mass index (BMI) 36.0-36.9, adult: Secondary | ICD-10-CM

## 2021-08-18 DIAGNOSIS — K5900 Constipation, unspecified: Secondary | ICD-10-CM | POA: Diagnosis not present

## 2021-08-18 DIAGNOSIS — Z7982 Long term (current) use of aspirin: Secondary | ICD-10-CM

## 2021-08-18 DIAGNOSIS — I2510 Atherosclerotic heart disease of native coronary artery without angina pectoris: Secondary | ICD-10-CM | POA: Diagnosis present

## 2021-08-18 DIAGNOSIS — I739 Peripheral vascular disease, unspecified: Secondary | ICD-10-CM | POA: Diagnosis present

## 2021-08-18 DIAGNOSIS — Z7902 Long term (current) use of antithrombotics/antiplatelets: Secondary | ICD-10-CM

## 2021-08-18 DIAGNOSIS — Z01811 Encounter for preprocedural respiratory examination: Secondary | ICD-10-CM

## 2021-08-18 DIAGNOSIS — Z955 Presence of coronary angioplasty implant and graft: Secondary | ICD-10-CM

## 2021-08-18 DIAGNOSIS — J449 Chronic obstructive pulmonary disease, unspecified: Secondary | ICD-10-CM | POA: Diagnosis present

## 2021-08-18 DIAGNOSIS — Z96653 Presence of artificial knee joint, bilateral: Secondary | ICD-10-CM | POA: Diagnosis present

## 2021-08-18 DIAGNOSIS — E785 Hyperlipidemia, unspecified: Secondary | ICD-10-CM | POA: Diagnosis present

## 2021-08-18 DIAGNOSIS — Z7951 Long term (current) use of inhaled steroids: Secondary | ICD-10-CM

## 2021-08-18 DIAGNOSIS — Z888 Allergy status to other drugs, medicaments and biological substances status: Secondary | ICD-10-CM

## 2021-08-18 HISTORY — PX: TOTAL HIP ARTHROPLASTY: SHX124

## 2021-08-18 LAB — BPAM RBC
Blood Product Expiration Date: 202302132359
Blood Product Expiration Date: 202302152359
Unit Type and Rh: 600
Unit Type and Rh: 9500

## 2021-08-18 LAB — TYPE AND SCREEN
ABO/RH(D): A NEG
Antibody Screen: POSITIVE
Unit division: 0
Unit division: 0

## 2021-08-18 SURGERY — ARTHROPLASTY, HIP, TOTAL, ANTERIOR APPROACH
Anesthesia: General | Site: Hip | Laterality: Right

## 2021-08-18 MED ORDER — ORAL CARE MOUTH RINSE: 15.0000 mL | Freq: Once | OROMUCOSAL | Status: AC

## 2021-08-18 MED ORDER — FUROSEMIDE 20 MG PO TABS: 20.0000 mg | ORAL_TABLET | ORAL | Status: DC

## 2021-08-18 MED ORDER — EPHEDRINE 5 MG/ML INJ
INTRAVENOUS | Status: AC
  Filled 2021-08-18: qty 5

## 2021-08-18 MED ORDER — METOPROLOL TARTRATE 25 MG PO TABS
25.0000 mg | ORAL_TABLET | Freq: Two times a day (BID) | ORAL | Status: DC
Start: 2021-08-18 — End: 2021-08-22
  Administered 2021-08-18 – 2021-08-22 (×7): 25 mg via ORAL
  Filled 2021-08-18 (×7): qty 1

## 2021-08-18 MED ORDER — SUMATRIPTAN SUCCINATE 50 MG PO TABS
100.0000 mg | ORAL_TABLET | ORAL | Status: DC | PRN
  Administered 2021-08-18 – 2021-08-19 (×2): 100 mg via ORAL
  Filled 2021-08-18 (×4): qty 2

## 2021-08-18 MED ORDER — TRANEXAMIC ACID 1000 MG/10ML IV SOLN
2000.0000 mg | Freq: Once | INTRAVENOUS | Status: AC
  Administered 2021-08-18: 2000 mg via TOPICAL
  Filled 2021-08-18: qty 20

## 2021-08-18 MED ORDER — SENNA 8.6 MG PO TABS
4.0000 | ORAL_TABLET | Freq: Two times a day (BID) | ORAL | Status: DC
  Administered 2021-08-18 – 2021-08-22 (×8): 34.4 mg via ORAL
  Filled 2021-08-18 (×8): qty 4

## 2021-08-18 MED ORDER — METOPROLOL TARTRATE 25 MG PO TABS
25.0000 mg | ORAL_TABLET | Freq: Once | ORAL | Status: AC
  Administered 2021-08-18: 25 mg via ORAL
  Filled 2021-08-18: qty 1

## 2021-08-18 MED ORDER — PROPOFOL 10 MG/ML IV BOLUS
INTRAVENOUS | Status: DC | PRN
  Administered 2021-08-18: 50 mg via INTRAVENOUS
  Administered 2021-08-18: 150 mg via INTRAVENOUS

## 2021-08-18 MED ORDER — WATER FOR IRRIGATION, STERILE IR SOLN
Status: DC | PRN
  Administered 2021-08-18: 1000 mL

## 2021-08-18 MED ORDER — BUPIVACAINE LIPOSOME 1.3 % IJ SUSP
INTRAMUSCULAR | Status: DC | PRN
  Administered 2021-08-18: 10 mL

## 2021-08-18 MED ORDER — CELECOXIB 200 MG PO CAPS
200.0000 mg | ORAL_CAPSULE | Freq: Two times a day (BID) | ORAL | Status: DC
  Administered 2021-08-18 – 2021-08-22 (×8): 200 mg via ORAL
  Filled 2021-08-18 (×8): qty 1

## 2021-08-18 MED ORDER — DEXAMETHASONE SODIUM PHOSPHATE 10 MG/ML IJ SOLN
INTRAMUSCULAR | Status: AC
  Filled 2021-08-18: qty 1

## 2021-08-18 MED ORDER — DIPHENHYDRAMINE HCL 12.5 MG/5ML PO ELIX: 12.5000 mg | ORAL_SOLUTION | ORAL | Status: DC | PRN

## 2021-08-18 MED ORDER — ALBUMIN HUMAN 5 % IV SOLN: INTRAVENOUS | Status: DC | PRN

## 2021-08-18 MED ORDER — FENTANYL CITRATE (PF) 100 MCG/2ML IJ SOLN
INTRAMUSCULAR | Status: DC | PRN
  Administered 2021-08-18 (×2): 50 ug via INTRAVENOUS
  Administered 2021-08-18: 100 ug via INTRAVENOUS

## 2021-08-18 MED ORDER — DEXAMETHASONE SODIUM PHOSPHATE 10 MG/ML IJ SOLN
10.0000 mg | Freq: Two times a day (BID) | INTRAMUSCULAR | Status: AC
  Administered 2021-08-18 – 2021-08-19 (×2): 10 mg via INTRAVENOUS
  Filled 2021-08-18 (×2): qty 1

## 2021-08-18 MED ORDER — ROCURONIUM BROMIDE 10 MG/ML (PF) SYRINGE
PREFILLED_SYRINGE | INTRAVENOUS | Status: DC | PRN
  Administered 2021-08-18: 20 mg via INTRAVENOUS
  Administered 2021-08-18: 100 mg via INTRAVENOUS

## 2021-08-18 MED ORDER — PAIN MANAGEMENT INTRATHECAL (IT) PUMP: 1.0000 | Status: DC

## 2021-08-18 MED ORDER — METHOCARBAMOL 500 MG IVPB - SIMPLE MED
INTRAVENOUS | Status: AC
  Filled 2021-08-18: qty 50

## 2021-08-18 MED ORDER — SUGAMMADEX SODIUM 200 MG/2ML IV SOLN
INTRAVENOUS | Status: DC | PRN
  Administered 2021-08-18: 200 mg via INTRAVENOUS

## 2021-08-18 MED ORDER — ONDANSETRON HCL 4 MG/2ML IJ SOLN
INTRAMUSCULAR | Status: DC | PRN
  Administered 2021-08-18: 4 mg via INTRAVENOUS

## 2021-08-18 MED ORDER — ORAL CARE MOUTH RINSE
15.0000 mL | Freq: Two times a day (BID) | OROMUCOSAL | Status: DC
  Administered 2021-08-18 – 2021-08-22 (×8): 15 mL via OROMUCOSAL

## 2021-08-18 MED ORDER — METHOCARBAMOL 500 MG IVPB - SIMPLE MED
500.0000 mg | Freq: Four times a day (QID) | INTRAVENOUS | Status: DC | PRN
  Administered 2021-08-18: 500 mg via INTRAVENOUS
  Filled 2021-08-18: qty 50

## 2021-08-18 MED ORDER — ASPIRIN EC 81 MG PO TBEC: 81.0000 mg | DELAYED_RELEASE_TABLET | Freq: Two times a day (BID) | ORAL | 11 refills | Status: DC

## 2021-08-18 MED ORDER — LIDOCAINE HCL (PF) 2 % IJ SOLN
INTRAMUSCULAR | Status: AC
  Filled 2021-08-18: qty 5

## 2021-08-18 MED ORDER — PANTOPRAZOLE SODIUM 40 MG PO TBEC
40.0000 mg | DELAYED_RELEASE_TABLET | Freq: Two times a day (BID) | ORAL | Status: DC
  Administered 2021-08-18 – 2021-08-22 (×8): 40 mg via ORAL
  Filled 2021-08-18 (×8): qty 1

## 2021-08-18 MED ORDER — DEXAMETHASONE SODIUM PHOSPHATE 4 MG/ML IJ SOLN
INTRAMUSCULAR | Status: DC | PRN
  Administered 2021-08-18: 4 mg via INTRAVENOUS

## 2021-08-18 MED ORDER — HYDROMORPHONE HCL 8 MG PO TABS: 8.0000 mg | ORAL_TABLET | Freq: Four times a day (QID) | ORAL | 0 refills | Status: DC | PRN

## 2021-08-18 MED ORDER — UMECLIDINIUM BROMIDE 62.5 MCG/ACT IN AEPB
1.0000 | INHALATION_SPRAY | Freq: Every day | RESPIRATORY_TRACT | Status: DC
  Administered 2021-08-19 – 2021-08-22 (×4): 1 via RESPIRATORY_TRACT
  Filled 2021-08-18: qty 7

## 2021-08-18 MED ORDER — EPHEDRINE SULFATE-NACL 50-0.9 MG/10ML-% IV SOSY
PREFILLED_SYRINGE | INTRAVENOUS | Status: DC | PRN
Start: 2021-08-18 — End: 2021-08-18
  Administered 2021-08-18: 5 mg via INTRAVENOUS
  Administered 2021-08-18: 10 mg via INTRAVENOUS
  Administered 2021-08-18: 5 mg via INTRAVENOUS

## 2021-08-18 MED ORDER — BUDESON-GLYCOPYRROL-FORMOTEROL 160-9-4.8 MCG/ACT IN AERO: 2.0000 | INHALATION_SPRAY | Freq: Two times a day (BID) | RESPIRATORY_TRACT | Status: DC

## 2021-08-18 MED ORDER — CEFAZOLIN SODIUM-DEXTROSE 2-4 GM/100ML-% IV SOLN
2.0000 g | Freq: Four times a day (QID) | INTRAVENOUS | Status: AC
  Administered 2021-08-18 (×2): 2 g via INTRAVENOUS
  Filled 2021-08-18 (×2): qty 100

## 2021-08-18 MED ORDER — ONDANSETRON HCL 4 MG/2ML IJ SOLN: 4.0000 mg | Freq: Four times a day (QID) | INTRAMUSCULAR | Status: DC | PRN

## 2021-08-18 MED ORDER — TRANEXAMIC ACID-NACL 1000-0.7 MG/100ML-% IV SOLN
1000.0000 mg | INTRAVENOUS | Status: AC
  Administered 2021-08-18: 1000 mg via INTRAVENOUS
  Filled 2021-08-18: qty 100

## 2021-08-18 MED ORDER — ACETAMINOPHEN 500 MG PO TABS
1000.0000 mg | ORAL_TABLET | Freq: Once | ORAL | Status: DC
  Filled 2021-08-18: qty 2

## 2021-08-18 MED ORDER — LIDOCAINE HCL (CARDIAC) PF 100 MG/5ML IV SOSY
PREFILLED_SYRINGE | INTRAVENOUS | Status: DC | PRN
  Administered 2021-08-18: 60 mg via INTRAVENOUS

## 2021-08-18 MED ORDER — POTASSIUM CHLORIDE CRYS ER 10 MEQ PO TBCR
10.0000 meq | EXTENDED_RELEASE_TABLET | ORAL | Status: DC
  Administered 2021-08-18 – 2021-08-22 (×3): 10 meq via ORAL
  Filled 2021-08-18 (×4): qty 1

## 2021-08-18 MED ORDER — BUPIVACAINE-EPINEPHRINE 0.25% -1:200000 IJ SOLN
INTRAMUSCULAR | Status: DC | PRN
  Administered 2021-08-18: 20 mL

## 2021-08-18 MED ORDER — TAMSULOSIN HCL 0.4 MG PO CAPS
0.8000 mg | ORAL_CAPSULE | Freq: Every day | ORAL | Status: DC
  Administered 2021-08-18 – 2021-08-22 (×5): 0.8 mg via ORAL
  Filled 2021-08-18 (×5): qty 2

## 2021-08-18 MED ORDER — CHLORHEXIDINE GLUCONATE 0.12 % MT SOLN
15.0000 mL | Freq: Once | OROMUCOSAL | Status: AC
  Administered 2021-08-18: 15 mL via OROMUCOSAL

## 2021-08-18 MED ORDER — LACTATED RINGERS IV SOLN: INTRAVENOUS | Status: DC

## 2021-08-18 MED ORDER — ALBUTEROL SULFATE HFA 108 (90 BASE) MCG/ACT IN AERS
2.0000 | INHALATION_SPRAY | RESPIRATORY_TRACT | Status: DC | PRN
  Filled 2021-08-18 (×2): qty 6.7

## 2021-08-18 MED ORDER — POLYETHYLENE GLYCOL 3350 17 G PO PACK
17.0000 g | PACK | Freq: Every day | ORAL | Status: DC | PRN
  Administered 2021-08-20: 17 g via ORAL
  Filled 2021-08-18: qty 1

## 2021-08-18 MED ORDER — AMISULPRIDE (ANTIEMETIC) 5 MG/2ML IV SOLN: 10.0000 mg | Freq: Once | INTRAVENOUS | Status: DC | PRN

## 2021-08-18 MED ORDER — ALUM & MAG HYDROXIDE-SIMETH 200-200-20 MG/5ML PO SUSP: 30.0000 mL | ORAL | Status: DC | PRN

## 2021-08-18 MED ORDER — TRAZODONE HCL 100 MG PO TABS
300.0000 mg | ORAL_TABLET | Freq: Every day | ORAL | Status: DC
  Administered 2021-08-18 – 2021-08-21 (×4): 300 mg via ORAL
  Filled 2021-08-18 (×4): qty 3

## 2021-08-18 MED ORDER — ONDANSETRON HCL 4 MG/2ML IJ SOLN
INTRAMUSCULAR | Status: AC
  Filled 2021-08-18: qty 2

## 2021-08-18 MED ORDER — BUPIVACAINE-EPINEPHRINE (PF) 0.25% -1:200000 IJ SOLN
INTRAMUSCULAR | Status: AC
  Filled 2021-08-18: qty 30

## 2021-08-18 MED ORDER — LOSARTAN POTASSIUM 25 MG PO TABS
25.0000 mg | ORAL_TABLET | Freq: Every day | ORAL | Status: DC
  Administered 2021-08-20 – 2021-08-22 (×3): 25 mg via ORAL
  Filled 2021-08-18 (×3): qty 1

## 2021-08-18 MED ORDER — FENTANYL CITRATE (PF) 100 MCG/2ML IJ SOLN
INTRAMUSCULAR | Status: AC
  Filled 2021-08-18: qty 2

## 2021-08-18 MED ORDER — VANCOMYCIN HCL IN DEXTROSE 1-5 GM/200ML-% IV SOLN
1000.0000 mg | INTRAVENOUS | Status: AC
  Administered 2021-08-18 (×2): 1000 mg via INTRAVENOUS
  Filled 2021-08-18: qty 200

## 2021-08-18 MED ORDER — PROMETHAZINE HCL 25 MG/ML IJ SOLN: 6.2500 mg | INTRAMUSCULAR | Status: DC | PRN

## 2021-08-18 MED ORDER — CELECOXIB 200 MG PO CAPS
200.0000 mg | ORAL_CAPSULE | Freq: Once | ORAL | Status: AC
  Administered 2021-08-18: 200 mg via ORAL
  Filled 2021-08-18: qty 1

## 2021-08-18 MED ORDER — POVIDONE-IODINE 10 % EX SWAB
2.0000 "application " | Freq: Once | CUTANEOUS | Status: AC
  Administered 2021-08-18: 2 via TOPICAL

## 2021-08-18 MED ORDER — TRANEXAMIC ACID-NACL 1000-0.7 MG/100ML-% IV SOLN
1000.0000 mg | Freq: Once | INTRAVENOUS | Status: AC
  Administered 2021-08-18: 1000 mg via INTRAVENOUS
  Filled 2021-08-18: qty 100

## 2021-08-18 MED ORDER — FUROSEMIDE 20 MG PO TABS
20.0000 mg | ORAL_TABLET | Freq: Every evening | ORAL | Status: DC
  Administered 2021-08-19 – 2021-08-21 (×3): 20 mg via ORAL
  Filled 2021-08-18 (×3): qty 1

## 2021-08-18 MED ORDER — BISACODYL 5 MG PO TBEC
5.0000 mg | DELAYED_RELEASE_TABLET | Freq: Every day | ORAL | Status: DC | PRN
  Administered 2021-08-20: 5 mg via ORAL
  Filled 2021-08-18: qty 1

## 2021-08-18 MED ORDER — NORTRIPTYLINE HCL 10 MG PO CAPS
40.0000 mg | ORAL_CAPSULE | Freq: Every day | ORAL | Status: DC
Start: 2021-08-18 — End: 2021-08-22
  Administered 2021-08-18 – 2021-08-21 (×4): 40 mg via ORAL
  Filled 2021-08-18 (×4): qty 4

## 2021-08-18 MED ORDER — CLOPIDOGREL BISULFATE 75 MG PO TABS
75.0000 mg | ORAL_TABLET | Freq: Every day | ORAL | Status: DC
Start: 2021-08-18 — End: 2021-08-22
  Administered 2021-08-18 – 2021-08-22 (×5): 75 mg via ORAL
  Filled 2021-08-18 (×5): qty 1

## 2021-08-18 MED ORDER — ALBUTEROL SULFATE HFA 108 (90 BASE) MCG/ACT IN AERS
INHALATION_SPRAY | RESPIRATORY_TRACT | Status: DC | PRN
  Administered 2021-08-18: 2 via RESPIRATORY_TRACT
  Administered 2021-08-18: 4 via RESPIRATORY_TRACT

## 2021-08-18 MED ORDER — FENTANYL CITRATE PF 50 MCG/ML IJ SOSY
25.0000 ug | PREFILLED_SYRINGE | INTRAMUSCULAR | Status: DC | PRN
  Administered 2021-08-18 (×3): 50 ug via INTRAVENOUS

## 2021-08-18 MED ORDER — HYDROMORPHONE HCL 1 MG/ML IJ SOLN
INTRAMUSCULAR | Status: DC | PRN
Start: 2021-08-18 — End: 2021-08-18
  Administered 2021-08-18 (×2): 1 mg via INTRAVENOUS

## 2021-08-18 MED ORDER — BUPIVACAINE LIPOSOME 1.3 % IJ SUSP
INTRAMUSCULAR | Status: AC
  Filled 2021-08-18: qty 10

## 2021-08-18 MED ORDER — DOCUSATE SODIUM 100 MG PO CAPS
100.0000 mg | ORAL_CAPSULE | Freq: Two times a day (BID) | ORAL | Status: DC
  Administered 2021-08-18 – 2021-08-22 (×8): 100 mg via ORAL
  Filled 2021-08-18 (×8): qty 1

## 2021-08-18 MED ORDER — FLUTICASONE FUROATE-VILANTEROL 200-25 MCG/ACT IN AEPB
1.0000 | INHALATION_SPRAY | Freq: Every day | RESPIRATORY_TRACT | Status: DC
  Administered 2021-08-19 – 2021-08-22 (×4): 1 via RESPIRATORY_TRACT
  Filled 2021-08-18: qty 28

## 2021-08-18 MED ORDER — ASPIRIN EC 81 MG PO TBEC
81.0000 mg | DELAYED_RELEASE_TABLET | Freq: Every day | ORAL | Status: DC
Start: 2021-08-18 — End: 2021-08-22
  Administered 2021-08-18 – 2021-08-22 (×5): 81 mg via ORAL
  Filled 2021-08-18 (×5): qty 1

## 2021-08-18 MED ORDER — FENTANYL CITRATE PF 50 MCG/ML IJ SOSY
PREFILLED_SYRINGE | INTRAMUSCULAR | Status: AC
  Filled 2021-08-18: qty 3

## 2021-08-18 MED ORDER — ONDANSETRON HCL 4 MG PO TABS
4.0000 mg | ORAL_TABLET | Freq: Four times a day (QID) | ORAL | Status: DC | PRN
  Administered 2021-08-18 – 2021-08-22 (×2): 4 mg via ORAL
  Filled 2021-08-18 (×2): qty 1

## 2021-08-18 MED ORDER — ALBUTEROL SULFATE HFA 108 (90 BASE) MCG/ACT IN AERS
INHALATION_SPRAY | RESPIRATORY_TRACT | Status: AC
  Filled 2021-08-18: qty 6.7

## 2021-08-18 MED ORDER — SERTRALINE HCL 100 MG PO TABS
100.0000 mg | ORAL_TABLET | Freq: Every day | ORAL | Status: DC
  Administered 2021-08-18 – 2021-08-21 (×4): 100 mg via ORAL
  Filled 2021-08-18 (×4): qty 1

## 2021-08-18 MED ORDER — SODIUM CHLORIDE 0.9 % IR SOLN
Status: DC | PRN
  Administered 2021-08-18: 1000 mL

## 2021-08-18 MED ORDER — PHENYLEPHRINE 40 MCG/ML (10ML) SYRINGE FOR IV PUSH (FOR BLOOD PRESSURE SUPPORT)
PREFILLED_SYRINGE | INTRAVENOUS | Status: DC | PRN
  Administered 2021-08-18 (×2): 80 ug via INTRAVENOUS

## 2021-08-18 MED ORDER — ROCURONIUM BROMIDE 10 MG/ML (PF) SYRINGE
PREFILLED_SYRINGE | INTRAVENOUS | Status: AC
  Filled 2021-08-18: qty 10

## 2021-08-18 MED ORDER — HYDROMORPHONE HCL 2 MG/ML IJ SOLN
INTRAMUSCULAR | Status: AC
  Filled 2021-08-18: qty 1

## 2021-08-18 MED ORDER — ACETAMINOPHEN 325 MG PO TABS
325.0000 mg | ORAL_TABLET | Freq: Four times a day (QID) | ORAL | Status: DC | PRN
  Administered 2021-08-19: 650 mg via ORAL
  Filled 2021-08-18: qty 2

## 2021-08-18 MED ORDER — HYDROMORPHONE HCL 1 MG/ML IJ SOLN
1.0000 mg | INTRAMUSCULAR | Status: DC | PRN
  Administered 2021-08-18: 2 mg via INTRAVENOUS
  Administered 2021-08-19: 1 mg via INTRAVENOUS
  Administered 2021-08-19 – 2021-08-22 (×11): 2 mg via INTRAVENOUS
  Filled 2021-08-18: qty 2
  Filled 2021-08-18: qty 1
  Filled 2021-08-18 (×11): qty 2

## 2021-08-18 MED ORDER — BUPIVACAINE LIPOSOME 1.3 % IJ SUSP: 10.0000 mL | Freq: Once | INTRAMUSCULAR | Status: DC

## 2021-08-18 MED ORDER — HYDROMORPHONE HCL 2 MG PO TABS
8.0000 mg | ORAL_TABLET | Freq: Four times a day (QID) | ORAL | Status: DC | PRN
  Administered 2021-08-18 – 2021-08-22 (×10): 8 mg via ORAL
  Filled 2021-08-18 (×11): qty 4

## 2021-08-18 MED ORDER — NITROGLYCERIN 0.4 MG SL SUBL: 0.4000 mg | SUBLINGUAL_TABLET | SUBLINGUAL | Status: DC | PRN

## 2021-08-18 MED ORDER — FUROSEMIDE 40 MG PO TABS
40.0000 mg | ORAL_TABLET | Freq: Every morning | ORAL | Status: DC
  Administered 2021-08-19 – 2021-08-22 (×4): 40 mg via ORAL
  Filled 2021-08-18 (×4): qty 1

## 2021-08-18 MED ORDER — METHOCARBAMOL 500 MG PO TABS
500.0000 mg | ORAL_TABLET | Freq: Four times a day (QID) | ORAL | Status: DC | PRN
  Administered 2021-08-18 – 2021-08-22 (×8): 500 mg via ORAL
  Filled 2021-08-18 (×8): qty 1

## 2021-08-18 MED ORDER — DUTASTERIDE 0.5 MG PO CAPS
0.5000 mg | ORAL_CAPSULE | Freq: Every day | ORAL | Status: DC
  Administered 2021-08-18 – 2021-08-21 (×4): 0.5 mg via ORAL
  Filled 2021-08-18 (×4): qty 1

## 2021-08-18 MED ORDER — SODIUM CHLORIDE 0.9 % IV SOLN: INTRAVENOUS | Status: DC

## 2021-08-18 SURGICAL SUPPLY — 47 items
APL SKNCLS STERI-STRIP NONHPOA (GAUZE/BANDAGES/DRESSINGS) ×1
BAG COUNTER SPONGE SURGICOUNT (BAG) IMPLANT
BAG DECANTER FOR FLEXI CONT (MISCELLANEOUS) ×1 IMPLANT
BAG SPEC THK2 15X12 ZIP CLS (MISCELLANEOUS)
BAG SPNG CNTER NS LX DISP (BAG)
BAG ZIPLOCK 12X15 (MISCELLANEOUS) IMPLANT
BENZOIN TINCTURE PRP APPL 2/3 (GAUZE/BANDAGES/DRESSINGS) ×1 IMPLANT
BLADE SAW SGTL 18X1.27X75 (BLADE) ×2 IMPLANT
BLADE SURG SZ10 CARB STEEL (BLADE) ×4 IMPLANT
COVER PERINEAL POST (MISCELLANEOUS) ×2 IMPLANT
COVER SURGICAL LIGHT HANDLE (MISCELLANEOUS) ×2 IMPLANT
CUP GRIPTION SECTOR 62MM (Orthopedic Implant) ×1 IMPLANT
DECANTER SPIKE VIAL GLASS SM (MISCELLANEOUS) ×2 IMPLANT
DRAPE FOOT SWITCH (DRAPES) ×2 IMPLANT
DRAPE STERI IOBAN 125X83 (DRAPES) ×2 IMPLANT
DRAPE U-SHAPE 47X51 STRL (DRAPES) ×4 IMPLANT
DRSG AQUACEL AG ADV 3.5X 6 (GAUZE/BANDAGES/DRESSINGS) ×1 IMPLANT
DRSG AQUACEL AG ADV 3.5X10 (GAUZE/BANDAGES/DRESSINGS) ×1 IMPLANT
DRSG DERMACEA 8X12 NADH (GAUZE/BANDAGES/DRESSINGS) ×1 IMPLANT
DURAPREP 26ML APPLICATOR (WOUND CARE) ×2 IMPLANT
ELECT BLADE TIP CTD 4 INCH (ELECTRODE) ×2 IMPLANT
ELECT REM PT RETURN 15FT ADLT (MISCELLANEOUS) ×2 IMPLANT
GAUZE XEROFORM 1X8 LF (GAUZE/BANDAGES/DRESSINGS) IMPLANT
GLOVE SRG 8 PF TXTR STRL LF DI (GLOVE) ×2 IMPLANT
GLOVE SURG NEOP MICRO LF SZ7.5 (GLOVE) ×4 IMPLANT
GLOVE SURG UNDER POLY LF SZ8 (GLOVE) ×4
GOWN STRL REUS W/TWL XL LVL3 (GOWN DISPOSABLE) ×4 IMPLANT
HEAD FEMUR METAL 36MM 15.6 HIP (Head) IMPLANT
HOLDER FOLEY CATH W/STRAP (MISCELLANEOUS) ×2 IMPLANT
HOOD PEEL AWAY FLYTE STAYCOOL (MISCELLANEOUS) ×4 IMPLANT
KIT TURNOVER KIT A (KITS) IMPLANT
LINER NEUTRAL 62MMC36MM P4 (Liner) ×1 IMPLANT
METAL FEMUR HEAD 36MM 15.6 HIP (Head) ×2 IMPLANT
NEEDLE HYPO 22GX1.5 SAFETY (NEEDLE) ×3 IMPLANT
PACK ANTERIOR HIP CUSTOM (KITS) ×2 IMPLANT
PENCIL SMOKE EVACUATOR (MISCELLANEOUS) IMPLANT
SLEEVE SURGEON STRL (DRAPES) ×1 IMPLANT
SPONGE T-LAP 18X18 ~~LOC~~+RFID (SPONGE) ×6 IMPLANT
STAPLER VISISTAT 35W (STAPLE) ×1 IMPLANT
STEM FEM CMTL CORAIL 20X45.5 (Stem) ×1 IMPLANT
STRIP CLOSURE SKIN 1/2X4 (GAUZE/BANDAGES/DRESSINGS) IMPLANT
SUT ETHIBOND NAB CT1 #1 30IN (SUTURE) ×5 IMPLANT
SUT MNCRL AB 3-0 PS2 18 (SUTURE) IMPLANT
SUT VIC AB 0 CT1 36 (SUTURE) ×2 IMPLANT
SUT VIC AB 1 CT1 36 (SUTURE) ×2 IMPLANT
SUT VIC AB 2-0 CT1 27 (SUTURE) ×4
SUT VIC AB 2-0 CT1 TAPERPNT 27 (SUTURE) ×1 IMPLANT

## 2021-08-18 NOTE — Interval H&P Note (Signed)
History and Physical Interval Note:  08/18/2021 7:01 AM  [name]  has presented today for surgery, with the diagnosis of RIGHT HIP SEVERE DEGENERATIVE JOINT DISEASE.  The various methods of treatment have been discussed with the patient and family. After consideration of risks, benefits and other options for treatment, the patient has consented to  Procedure(s): TOTAL HIP ARTHROPLASTY ANTERIOR APPROACH (Right) as a surgical intervention.  The patient's history has been reviewed, patient examined, no change in status, stable for surgery.  I have reviewed the patient's chart and labs.  Questions were answered to the patient's satisfaction.     Jerry [name]

## 2021-08-18 NOTE — Anesthesia Postprocedure Evaluation (Signed)
Anesthesia Post Note  Patient: Jerry [name]  Procedure(s) Performed: TOTAL HIP ARTHROPLASTY ANTERIOR APPROACH (Right: Hip)     Patient location during evaluation: PACU Anesthesia Type: General Level of consciousness: sedated Pain management: pain level controlled Vital Signs Assessment: post-procedure vital signs reviewed and stable Respiratory status: spontaneous breathing and respiratory function stable Cardiovascular status: stable Postop Assessment: no apparent nausea or vomiting Anesthetic complications: no   No notable events documented.  Last Vitals:  Vitals:   08/18/21 1145 08/18/21 1200  BP: (!) 144/67 (!) 144/67  Pulse:    Resp:    Temp:    SpO2:      Last Pain:  Vitals:   08/18/21 1130  TempSrc:   PainSc: 5                  Santo Zahradnik DANIEL

## 2021-08-18 NOTE — Evaluation (Signed)
Physical Therapy Evaluation Patient Details Name: Jerry [name] MRN: [id number] DOB: 1950/09/23 Today's Date: 08/18/2021  History of Present Illness  Pt s/p R THR and with hx of CAD, COPD (home O2@2L ), PVD, BIl TKR, multiple R shoulder surgery, multiple back surgery, chronic pain syndrome and CHF  Clinical Impression  Pt s/p R THR and presents with decreased R LE strength/ROM and post op pain limiting functional mobility.  Pt should progress to dc home with family assist and would benefit from follow up HHPT to maximize IND and safety.     Recommendations for follow up therapy are one component of a multi-disciplinary discharge planning process, led by the attending physician.  Recommendations may be updated based on patient status, additional functional criteria and insurance authorization.  Follow Up Recommendations Follow physician's recommendations for discharge plan and follow up therapies    Assistance Recommended at Discharge Frequent or constant Supervision/Assistance  Patient can return home with the following  A little help with walking and/or transfers;A little help with bathing/dressing/bathroom;Assistance with cooking/housework;Help with stairs or ramp for entrance    Equipment Recommendations None recommended by PT  Recommendations for Other Services       Functional Status Assessment Patient has had a recent decline in their functional status and demonstrates the ability to make significant improvements in function in a reasonable and predictable amount of time.     Precautions / Restrictions Precautions Precautions: Fall Restrictions Weight Bearing Restrictions: No RLE Weight Bearing: Weight bearing as tolerated      Mobility  Bed Mobility Overal bed mobility: Needs Assistance Bed Mobility: Supine to Sit;Sit to Supine     Supine to sit: Min assist;HOB elevated;Mod assist Sit to supine: Mod assist   General bed mobility comments: Use of rails with cues  for sequence, use of L LE to self assist.  Physical assist for R LE to exit bed but bilat LEs to return to bed    Transfers Overall transfer level: Needs assistance Equipment used: Rolling walker (2 wheels) Transfers: Sit to/from Stand Sit to Stand: Min assist;From elevated surface           General transfer comment: cues for LE management and use of UEs to self assist    Ambulation/Gait Ambulation/Gait assistance: Min assist;+2 safety/equipment Gait Distance (Feet): 3 Feet Assistive device: Rolling walker (2 wheels) Gait Pattern/deviations: Step-to pattern;Decreased step length - right;Decreased step length - left;Shuffle;Trunk flexed Gait velocity: decr     General Gait Details: cues for sequence and steady assist to shuffle up side of bed with RW - pain ltd  Stairs            Wheelchair Mobility    Modified Rankin (Stroke Patients Only)       Balance Overall balance assessment: Needs assistance Sitting-balance support: No upper extremity supported;Feet supported Sitting balance-Leahy Scale: Good     Standing balance support: Bilateral upper extremity supported Standing balance-Leahy Scale: Poor                               Pertinent Vitals/Pain Pain Assessment: 0-10 Pain Score: 8  Pain Location: R hip and back Pain Descriptors / Indicators: Aching;Grimacing;Guarding;Spasm Pain Intervention(s): Limited activity within patient's tolerance;Monitored during session;Premedicated before session;Patient requesting pain meds-RN notified;Ice applied    Home Living Family/patient expects to be discharged to:: Private residence Living Arrangements: Children Available Help at Discharge: Family;Available PRN/intermittently Type of Home: House Home Access: Stairs to enter Entrance  Stairs-Rails: Right;Left;Can reach both Entrance Stairs-Number of Steps: 4   Home Layout: One level Home Equipment: Conservation officer, nature (2 wheels);Cane - single  point;BSC/3in1;Hospital bed Additional Comments: pt lives with his son and son's family    Prior Function Prior Level of Function : Independent/Modified Independent             Mobility Comments: use of cane or RW as needed       Hand Dominance   Dominant Hand: Right    Extremity/Trunk Assessment   Upper Extremity Assessment Upper Extremity Assessment: Overall WFL for tasks assessed    Lower Extremity Assessment Lower Extremity Assessment: RLE deficits/detail       Communication   Communication: No difficulties  Cognition Arousal/Alertness: Awake/alert Behavior During Therapy: WFL for tasks assessed/performed Overall Cognitive Status: Within Functional Limits for tasks assessed                                          General Comments      Exercises Total Joint Exercises Ankle Circles/Pumps: AROM;Both;15 reps;Supine   Assessment/Plan    PT Assessment Patient needs continued PT services  PT Problem List Decreased strength;Decreased activity tolerance;Decreased balance;Decreased mobility;Decreased range of motion;Decreased knowledge of use of DME;Pain;Obesity       PT Treatment Interventions DME instruction;Gait training;Stair training;Functional mobility training;Therapeutic activities;Therapeutic exercise;Patient/family education    PT Goals (Current goals can be found in the Care Plan section)  Acute Rehab PT Goals Patient Stated Goal: Regain IND PT Goal Formulation: With patient Time For Goal Achievement: 08/25/21 Potential to Achieve Goals: Good    Frequency 7X/week     Co-evaluation               AM-PAC PT "6 Clicks" Mobility  Outcome Measure Help needed turning from your back to your side while in a flat bed without using bedrails?: A Lot Help needed moving from lying on your back to sitting on the side of a flat bed without using bedrails?: A Lot Help needed moving to and from a bed to a chair (including a  wheelchair)?: A Lot Help needed standing up from a chair using your arms (e.g., wheelchair or bedside chair)?: Total Help needed to walk in hospital room?: Total Help needed climbing 3-5 steps with a railing? : Total 6 Click Score: 9    End of Session Equipment Utilized During Treatment: Gait belt;Oxygen Activity Tolerance: Patient limited by pain Patient left: in bed;with call bell/phone within reach;with family/visitor present Nurse Communication: Mobility status;Patient requests pain meds PT Visit Diagnosis: Unsteadiness on feet (R26.81);Difficulty in walking, not elsewhere classified (R26.2);Pain Pain - Right/Left: Right Pain - part of body: Hip    Time: 1610-9604 PT Time Calculation (min) (ACUTE ONLY): 27 min   Charges:   PT Evaluation $PT Eval Low Complexity: 1 Low PT Treatments $Therapeutic Activity: 8-22 mins        Debe Coder PT Acute Rehabilitation Services Pager [phone] Office [phone]   Makye Radle 08/18/2021, 3:05 PM

## 2021-08-18 NOTE — Op Note (Addendum)
PATIENT ID:      Jerry [name]  MRN:     [id number] DOB/AGE:    1951/03/15 / 71 y.o.       OPERATIVE REPORT    DATE OF PROCEDURE:  08/18/2021       PREOPERATIVE DIAGNOSIS:  RIGHT HIP SEVERE DEGENERATIVE JOINT DISEASE                                                       Estimated body mass index is 36.91 kg/m as calculated from the following:   Height as of 08/14/21: 6\' 1"  (1.854 m).   Weight as of this encounter: 126.9 kg.     POSTOPERATIVE DIAGNOSIS:  RIGHT HIP SEVERE DEGENERATIVE JOINT DISEASE                                                           PROCEDURE:  1. Right  total hip arthroplasty using a 62 mm DePuy Pinnacle sector gription Cup, ,  neutral liner, a +15.5 36 mm metalhead,  and a #20  Corail stem, 2.interpretation of multiple intraoperative fluoroscopic images   SURGEON: Alta Corning    ASSISTANT:   Nehemiah Massed PA-C  (present throughout entire procedure and necessary for timely completion of the procedure)  ANESTHESIA: general  BLOOD LOSS: 950cc Tranexamic Acid: 1 gram IV DRAINS: None COMPLICATIONS: None    NDICATIONS FOR PROCEDURE:Patient with end-stage arthritis of the right hip.  X-rays show bone-on-bone arthritic changes. Despite conservative measures with observation, anti-inflammatory medicine, narcotics, use of a cane, has severe unremitting pain and can ambulate only less than 1 block before resting.  Patient desires elective right total hip arthroplasty to decrease pain and increase function. The risks, benefits, and alternatives were discussed at length including but not limited to the risks of infection, bleeding, nerve injury, stiffness, blood clots, the need for revision surgery, cardiopulmonary complications, among others, and they were willing to proceed.Benefits have been discussed. Questions answered.     PROCEDURE IN DETAIL: The patient was identified by armband,  received preoperative IV antibiotics in the holding area at [facility],  taken to the operating room , appropriate anesthetic monitors  were attached and spinal anesthesia was induced.  The patient was placed onto the hot bed and all bony prominences were well-padded.The right hip was prepped and draped for an anterior approach to the hip.  An incision was made and the subcutaneous dissection was down to the level of the tensor fascia.  The fascia was opened and finger dissected.  The bleeders coming across the anterior portion of the hip were identified and cauterized. Retractors were put in place above and below the femoral neck.  The capsule was opened and tagged and a provisional neck cut was made.  The head was removed and sized on the back table.  The acetabulum was sequentially reamed to a level of 61 mm and a 62 mm porous-coated pinnacle cup was hammered into place with 45 of lateral opening and 30 of anteversion.fluoroscopy was used to ensure this position of the cup.  Attention was turned towards the femur where the leg was  actually rotated, extended, and adduction did.  The femur was sequentially broached until a size of 20 broach gave a perfect fit and fill.at this point a  15.5 mm metal hip ball was placed and the hip reduced.  Fluoroscopic images were taken to assess the leg length, fit and fill of the stem, and cup position.  We were happy with the construct at this point.  The 20 broach was removed and a final Corrail stem with standard offset  and a 15.25mm ceramic hip ball was placed and reduced.  Final images were taken to make certain there were happy with the position at this point.   The capsule was closed with #1 Vicryl suture.  The tensor fascia was closed with 0 Vicryl suture.  The skin was then closed with combination of 0 and 2-0 Vicryl suture.  The top layer was with staples because of his panus and concern for poor healing and a sterile compressive dressing was applied and the patient taken to recovery room she noted be in satisfactory condition.  Past  medical Motion for the procedure was approximately 300 cc.  Of note [name] was with the entire case and assisted by retraction of tissues, manipulation of the leg, and closing the minimize or time.    Alta Corning 01/29/2021, 6:15 PM  Alta Corning 08/18/2021, 10:18 AM

## 2021-08-18 NOTE — Transfer of Care (Signed)
Immediate Anesthesia Transfer of Care Note  Patient: Jerry [name]  Procedure(s) Performed: TOTAL HIP ARTHROPLASTY ANTERIOR APPROACH (Right: Hip)  Patient Location: PACU  Anesthesia Type:General  Level of Consciousness: awake, alert  and oriented  Airway & Oxygen Therapy: Patient Spontanous Breathing and Patient connected to face mask oxygen  Post-op Assessment: Report given to RN and Post -op Vital signs reviewed and stable  Post vital signs: Reviewed and stable  Last Vitals:  Vitals Value Taken Time  BP 117/99 08/18/21 1036  Temp    Pulse 34 08/18/21 1036  Resp 19 08/18/21 1037  SpO2 100 % 08/18/21 1036  Vitals shown include unvalidated device data.  Last Pain:  Vitals:   08/18/21 0611  TempSrc: Oral  PainSc:          Complications: No notable events documented.

## 2021-08-18 NOTE — Plan of Care (Signed)
°  Problem: Education: Goal: Knowledge of General Education information will improve Description: Including pain rating scale, medication(s)/side effects and non-pharmacologic comfort measures Outcome: Progressing   Problem: Clinical Measurements: Goal: Ability to maintain clinical measurements within normal limits will improve Outcome: Progressing   Problem: Pain Managment: Goal: General experience of comfort will improve Outcome: Progressing   Problem: Activity: Goal: Ability to avoid complications of mobility impairment will improve Outcome: Progressing   Problem: Clinical Measurements: Goal: Postoperative complications will be avoided or minimized Outcome: Progressing

## 2021-08-18 NOTE — Anesthesia Procedure Notes (Signed)
Procedure Name: Intubation Date/Time: 08/18/2021 7:25 AM Performed by: Eulas Post, Traxton Kolenda W, CRNA Pre-anesthesia Checklist: Patient identified, Emergency Drugs available, Suction available and Patient being monitored Patient Re-evaluated:Patient Re-evaluated prior to induction Oxygen Delivery Method: Circle system utilized Preoxygenation: Pre-oxygenation with 100% oxygen Induction Type: IV induction Ventilation: Mask ventilation without difficulty Laryngoscope Size: Miller and 2 Grade View: Grade I Tube type: Oral Tube size: 7.5 mm Number of attempts: 1 Airway Equipment and Method: Stylet Placement Confirmation: ETT inserted through vocal cords under direct vision, positive ETCO2 and breath sounds checked- equal and bilateral Secured at: 22 cm Tube secured with: Tape Dental Injury: Teeth and Oropharynx as per pre-operative assessment

## 2021-08-18 NOTE — Discharge Instructions (Signed)

## 2021-08-19 LAB — BASIC METABOLIC PANEL
Anion gap: 6 (ref 5–15)
BUN: 8 mg/dL (ref 8–23)
CO2: 36 mmol/L — ABNORMAL HIGH (ref 22–32)
Calcium: 8.7 mg/dL — ABNORMAL LOW (ref 8.9–10.3)
Chloride: 99 mmol/L (ref 98–111)
Creatinine, Ser: 0.82 mg/dL (ref 0.61–1.24)
GFR, Estimated: >60 mL/min (ref >60)
Glucose, Bld: 161 mg/dL — ABNORMAL HIGH (ref 70–99)
Potassium: 3.4 mmol/L — ABNORMAL LOW (ref 3.5–5.1)
Sodium: 141 mmol/L (ref 135–145)

## 2021-08-19 LAB — CBC
HCT: 28.8 % — ABNORMAL LOW (ref 39.0–52.0)
Hemoglobin: 8.5 g/dL — ABNORMAL LOW (ref 13.0–17.0)
MCH: 26.9 pg (ref 26.0–34.0)
MCHC: 29.5 g/dL — ABNORMAL LOW (ref 30.0–36.0)
MCV: 91.1 fL (ref 80.0–100.0)
Platelets: 207 10*3/uL (ref 150–400)
RBC: 3.16 MIL/uL — ABNORMAL LOW (ref 4.22–5.81)
RDW: 14.6 % (ref 11.5–15.5)
WBC: 8.8 10*3/uL (ref 4.0–10.5)
nRBC: 0 % (ref 0.0–0.2)

## 2021-08-19 NOTE — Progress Notes (Signed)
Orthopedic Tech Progress Note Patient Details:  Jerry [name] 01/04/1951 [id number]  Patient ID: [name], male   DOB: 01/22/1951, 71 y.o.   MRN: [id number] No OHF; pt is over weight limit.  Vernona Rieger 08/19/2021, 8:33 AM

## 2021-08-19 NOTE — Plan of Care (Signed)
°  Problem: Education: Goal: Knowledge of General Education information will improve Description: Including pain rating scale, medication(s)/side effects and non-pharmacologic comfort measures Outcome: Progressing   Problem: Clinical Measurements: Goal: Ability to maintain clinical measurements within normal limits will improve Outcome: Progressing   Problem: Coping: Goal: Level of anxiety will decrease Outcome: Progressing   Problem: Pain Managment: Goal: General experience of comfort will improve Outcome: Progressing   Problem: Education: Goal: Knowledge of the prescribed therapeutic regimen will improve Outcome: Progressing   Problem: Activity: Goal: Ability to avoid complications of mobility impairment will improve Outcome: Progressing

## 2021-08-19 NOTE — Progress Notes (Signed)
Physical Therapy Treatment Patient Details Name: Jerry [name] MRN: [id number] DOB: 05-27-1951 Today's Date: 08/19/2021   History of Present Illness Pt s/p R THR and with hx of CAD, COPD (home O2@2L ), PVD, BIl TKR, multiple R shoulder surgery, multiple back surgery, chronic pain syndrome and CHF    PT Comments    Pt continues very motivated and is progressing with mobility but continues pain limited.  Pt hopeful for dc home tomorrow.   Recommendations for follow up therapy are one component of a multi-disciplinary discharge planning process, led by the attending physician.  Recommendations may be updated based on patient status, additional functional criteria and insurance authorization.  Follow Up Recommendations  Follow physician's recommendations for discharge plan and follow up therapies     Assistance Recommended at Discharge Frequent or constant Supervision/Assistance  Patient can return home with the following A little help with walking and/or transfers;A little help with bathing/dressing/bathroom;Assistance with cooking/housework;Help with stairs or ramp for entrance   Equipment Recommendations  None recommended by PT    Recommendations for Other Services       Precautions / Restrictions Precautions Precautions: Fall Restrictions Weight Bearing Restrictions: No RLE Weight Bearing: Weight bearing as tolerated     Mobility  Bed Mobility Overal bed mobility: Needs Assistance Bed Mobility: Supine to Sit;Sit to Supine     Supine to sit: Min assist;HOB elevated;Mod assist     General bed mobility comments: Use of rails with cues for sequence, use of L LE to self assist.  Physical assist for R LE and to bring trunk to upright; Assist to manage bilat LEs into bed    Transfers Overall transfer level: Needs assistance Equipment used: Rolling walker (2 wheels) Transfers: Sit to/from Stand;Bed to chair/wheelchair/BSC Sit to Stand: Min assist;From elevated surface      Step pivot transfers: Min assist     General transfer comment: cues for LE management and use of UEs to self assist.  Step pvt chair to bed    Ambulation/Gait Ambulation/Gait assistance: Min assist;+2 safety/equipment Gait Distance (Feet): 44 Feet Assistive device: Rolling walker (2 wheels) Gait Pattern/deviations: Step-to pattern;Decreased step length - right;Decreased step length - left;Shuffle;Trunk flexed Gait velocity: decr     General Gait Details: cues for sequence, posture and position from [employer]             Wheelchair Mobility    Modified Rankin (Stroke Patients Only)       Balance Overall balance assessment: Needs assistance Sitting-balance support: No upper extremity supported;Feet supported Sitting balance-Leahy Scale: Good     Standing balance support: Single extremity supported Standing balance-Leahy Scale: Poor                              Cognition Arousal/Alertness: Awake/alert Behavior During Therapy: WFL for tasks assessed/performed Overall Cognitive Status: Within Functional Limits for tasks assessed                                          Exercises Total Joint Exercises Ankle Circles/Pumps: AROM;Both;15 reps;Supine Quad Sets: AROM;Both;10 reps;Supine Heel Slides: AAROM;Right;15 reps;Supine Hip ABduction/ADduction: AAROM;Right;15 reps;Supine    General Comments        Pertinent Vitals/Pain Pain Assessment: 0-10 Pain Score: 6  Pain Location: R hip and back Pain Descriptors / Indicators: Aching;Grimacing;Guarding;Spasm Pain Intervention(s): Limited activity within  patient's tolerance;Monitored during session;Premedicated before session;Ice applied    Home Living                          Prior Function            PT Goals (current goals can now be found in the care plan section) Acute Rehab PT Goals Patient Stated Goal: Regain IND PT Goal Formulation: With patient Time For  Goal Achievement: 08/25/21 Potential to Achieve Goals: Good Progress towards PT goals: Progressing toward goals    Frequency    7X/week      PT Plan Current plan remains appropriate    Co-evaluation              AM-PAC PT "6 Clicks" Mobility   Outcome Measure  Help needed turning from your back to your side while in a flat bed without using bedrails?: A Lot Help needed moving from lying on your back to sitting on the side of a flat bed without using bedrails?: A Lot Help needed moving to and from a bed to a chair (including a wheelchair)?: A Lot Help needed standing up from a chair using your arms (e.g., wheelchair or bedside chair)?: A Lot Help needed to walk in hospital room?: A Little Help needed climbing 3-5 steps with a railing? : A Lot 6 Click Score: 13    End of Session Equipment Utilized During Treatment: Oxygen;Gait belt Activity Tolerance: Patient limited by pain;Patient limited by fatigue Patient left: in bed;with call bell/phone within reach;with bed alarm set Nurse Communication: Mobility status PT Visit Diagnosis: Unsteadiness on feet (R26.81);Difficulty in walking, not elsewhere classified (R26.2);Pain Pain - Right/Left: Right Pain - part of body: Hip     Time: 9562-1308 PT Time Calculation (min) (ACUTE ONLY): 22 min  Charges:  $Gait Training: 8-22 mins $Therapeutic Exercise: 8-22 mins                     [place] Pager [phone] Office [phone]    Socorro Ebron 08/19/2021, 2:47 PM

## 2021-08-19 NOTE — Progress Notes (Signed)
Physical Therapy Treatment Patient Details Name: Jerry [name] MRN: [id number] DOB: January 28, 1951 Today's Date: 08/19/2021   History of Present Illness Pt s/p R THR and with hx of CAD, COPD (home O2@2L ), PVD, BIl TKR, multiple R shoulder surgery, multiple back surgery, chronic pain syndrome and CHF    PT Comments    Pt very cooperative and performed therex program with assist.  Pt requested short rest and additional IV pain meds prior to attempting OOB.   Recommendations for follow up therapy are one component of a multi-disciplinary discharge planning process, led by the attending physician.  Recommendations may be updated based on patient status, additional functional criteria and insurance authorization.  Follow Up Recommendations  Follow physician's recommendations for discharge plan and follow up therapies     Assistance Recommended at Discharge Frequent or constant Supervision/Assistance  Patient can return home with the following A little help with walking and/or transfers;A little help with bathing/dressing/bathroom;Assistance with cooking/housework;Help with stairs or ramp for entrance   Equipment Recommendations  None recommended by PT    Recommendations for Other Services       Precautions / Restrictions Precautions Precautions: Fall Restrictions Weight Bearing Restrictions: No RLE Weight Bearing: Weight bearing as tolerated     Mobility  Bed Mobility                    Transfers                        Ambulation/Gait                   Stairs             Wheelchair Mobility    Modified Rankin (Stroke Patients Only)       Balance                                            Cognition Arousal/Alertness: Awake/alert Behavior During Therapy: WFL for tasks assessed/performed Overall Cognitive Status: Within Functional Limits for tasks assessed                                           Exercises Total Joint Exercises Ankle Circles/Pumps: AROM;Both;15 reps;Supine Quad Sets: AROM;Both;10 reps;Supine Heel Slides: AAROM;Right;15 reps;Supine Hip ABduction/ADduction: AAROM;Right;15 reps;Supine    General Comments        Pertinent Vitals/Pain Pain Assessment: 0-10 Pain Score: 9  Pain Location: R hip and back Pain Descriptors / Indicators: Aching;Grimacing;Guarding;Spasm Pain Intervention(s): Limited activity within patient's tolerance;Monitored during session;Premedicated before session;Patient requesting pain meds-RN notified    Home Living                          Prior Function            PT Goals (current goals can now be found in the care plan section) Acute Rehab PT Goals Patient Stated Goal: Regain IND PT Goal Formulation: With patient Time For Goal Achievement: 08/25/21 Potential to Achieve Goals: Good Progress towards PT goals: Progressing toward goals    Frequency    7X/week      PT Plan Current plan remains appropriate    Co-evaluation  AM-PAC PT "6 Clicks" Mobility   Outcome Measure  Help needed turning from your back to your side while in a flat bed without using bedrails?: A Lot Help needed moving from lying on your back to sitting on the side of a flat bed without using bedrails?: A Lot Help needed moving to and from a bed to a chair (including a wheelchair)?: A Lot Help needed standing up from a chair using your arms (e.g., wheelchair or bedside chair)?: Total Help needed to walk in hospital room?: Total Help needed climbing 3-5 steps with a railing? : Total 6 Click Score: 9    End of Session Equipment Utilized During Treatment: Oxygen Activity Tolerance: Patient limited by pain Patient left: in bed;with call bell/phone within reach Nurse Communication: Patient requests pain meds PT Visit Diagnosis: Unsteadiness on feet (R26.81);Difficulty in walking, not elsewhere classified (R26.2);Pain Pain -  Right/Left: Right Pain - part of body: Hip     Time: 1000-1012 PT Time Calculation (min) (ACUTE ONLY): 12 min  Charges:  $Therapeutic Exercise: 8-22 mins                     Jerry [name] PT Acute Rehabilitation Services Pager [phone] Office [phone]    Jerry [name] 08/19/2021, 12:41 PM

## 2021-08-19 NOTE — TOC Transition Note (Signed)
Transition of Care [facility]) - CM/SW Discharge Note   Patient Details  Name: Jerry [name] MRN: [id number] Date of Birth: 06-10-1951  Transition of Care [facility]) CM/SW Contact:  Ross Ludwig, LCSW Phone Number: 08/19/2021, 12:25 PM   Clinical Narrative:     Patient will be going home with home health through [facility].  CSW signing off please reconsult with any other social work needs, home health agency has been notified of planned discharge.   Final next level of care: [place] Barriers to Discharge: Barriers Resolved   Patient Goals and CMS Choice Patient states their goals for this hospitalization and ongoing recovery are:: To return back home with home health. CMS Medicare.gov Compare Post Acute Care list provided to:: Patient Choice offered to / list presented to : Patient  Discharge Placement                       Discharge Plan and Services                          HH Arranged: PT [facility] Agency: [place] Date [place]: 08/14/21 Time [place]: [id number] Representative spoke with at [place]: [place] ([place]) Interventions     Readmission Risk Interventions Readmission Risk Prevention Plan 09/30/2020  Transportation Screening Complete  PCP or Specialist Appt within 3-5 Days Complete  HRI or [place] Complete  Social Work Consult for [place] Planning/Counseling Complete  Palliative Care Screening Not Applicable  Medication Review Press photographer) Complete  Some recent data might be hidden

## 2021-08-19 NOTE — Progress Notes (Signed)
Jerry [name], [place] to inform that patient did not pass PT and was not ready to be discharged today. Patient is hopeful to discharge tomorrow (08/20/21). Will continue to monitor patient's pain.

## 2021-08-19 NOTE — Progress Notes (Signed)
Physical Therapy Treatment Patient Details Name: Jerry [name] MRN: [id number] DOB: [birth year] Today's Date: 08/19/2021   History of Present Illness Pt s/p R THR and with hx of CAD, COPD (home O2@2L ), PVD, BIl TKR, multiple R shoulder surgery, multiple back surgery, chronic pain syndrome and CHF    PT Comments    Pt continues very cooperative and up to ambulate limited distance in hall but requiring increased time for all tasks and limited by pain level.   Recommendations for follow up therapy are one component of a multi-disciplinary discharge planning process, led by the attending physician.  Recommendations may be updated based on patient status, additional functional criteria and insurance authorization.  Follow Up Recommendations  Follow physician's recommendations for discharge plan and follow up therapies     Assistance Recommended at Discharge Frequent or constant Supervision/Assistance  Patient can return home with the following A little help with walking and/or transfers;A little help with bathing/dressing/bathroom;Assistance with cooking/housework;Help with stairs or ramp for entrance   Equipment Recommendations  None recommended by PT    Recommendations for Other Services       Precautions / Restrictions Precautions Precautions: Fall Restrictions Weight Bearing Restrictions: No RLE Weight Bearing: Weight bearing as tolerated     Mobility  Bed Mobility Overal bed mobility: Needs Assistance Bed Mobility: Supine to Sit     Supine to sit: Min assist;HOB elevated     General bed mobility comments: Use of rails with cues for sequence, use of L LE to self assist.  Physical assist for R LE and to bring trunk to upright    Transfers Overall transfer level: Needs assistance Equipment used: Rolling walker (2 wheels) Transfers: Sit to/from Stand Sit to Stand: Min assist;From elevated surface           General transfer comment: cues for LE management and use  of UEs to self assist    Ambulation/Gait Ambulation/Gait assistance: Min assist;+2 safety/equipment (chair follow) Gait Distance (Feet): 29 Feet Assistive device: Rolling walker (2 wheels) Gait Pattern/deviations: Step-to pattern;Decreased step length - right;Decreased step length - left;Shuffle;Trunk flexed Gait velocity: decr     General Gait Details: cues for sequence, posture and position from [employer]             Wheelchair Mobility    Modified Rankin (Stroke Patients Only)       Balance Overall balance assessment: Needs assistance Sitting-balance support: No upper extremity supported;Feet supported Sitting balance-Leahy Scale: Good     Standing balance support: Bilateral upper extremity supported Standing balance-Leahy Scale: Poor                              Cognition Arousal/Alertness: Awake/alert Behavior During Therapy: WFL for tasks assessed/performed Overall Cognitive Status: Within Functional Limits for tasks assessed                                          Exercises Total Joint Exercises Ankle Circles/Pumps: AROM;Both;15 reps;Supine Quad Sets: AROM;Both;10 reps;Supine Heel Slides: AAROM;Right;15 reps;Supine Hip ABduction/ADduction: AAROM;Right;15 reps;Supine    General Comments        Pertinent Vitals/Pain Pain Assessment: 0-10 Pain Score: 7  Pain Location: R hip and back Pain Descriptors / Indicators: Aching;Grimacing;Guarding;Spasm Pain Intervention(s): Limited activity within patient's tolerance;Monitored during session;Premedicated before session;Ice applied    Home Living  Prior Function            PT Goals (current goals can now be found in the care plan section) Acute Rehab PT Goals Patient Stated Goal: Regain IND PT Goal Formulation: With patient Time For Goal Achievement: 08/25/21 Potential to Achieve Goals: Good Progress towards PT goals: Progressing  toward goals    Frequency    7X/week      PT Plan Current plan remains appropriate    Co-evaluation              AM-PAC PT "6 Clicks" Mobility   Outcome Measure  Help needed turning from your back to your side while in a flat bed without using bedrails?: A Lot Help needed moving from lying on your back to sitting on the side of a flat bed without using bedrails?: A Lot Help needed moving to and from a bed to a chair (including a wheelchair)?: A Lot Help needed standing up from a chair using your arms (e.g., wheelchair or bedside chair)?: A Lot Help needed to walk in hospital room?: A Little Help needed climbing 3-5 steps with a railing? : Total 6 Click Score: 12    End of Session Equipment Utilized During Treatment: Oxygen;Gait belt Activity Tolerance: Patient limited by pain Patient left: in chair;with call bell/phone within reach;with chair alarm set Nurse Communication: Mobility status PT Visit Diagnosis: Unsteadiness on feet (R26.81);Difficulty in walking, not elsewhere classified (R26.2);Pain Pain - Right/Left: Right Pain - part of body: Hip     Time: 6761-9509 PT Time Calculation (min) (ACUTE ONLY): 24 min  Charges:  $Gait Training: 23-37 mins $Therapeutic Exercise: 8-22 mins                     [place] Pager [phone] Office [phone]    Makynlee Kressin 08/19/2021, 12:48 PM

## 2021-08-19 NOTE — Plan of Care (Signed)
°  Problem: Education: Goal: Knowledge of General Education information will improve Description: Including pain rating scale, medication(s)/side effects and non-pharmacologic comfort measures Outcome: Progressing   Problem: Health Behavior/Discharge Planning: Goal: Ability to manage health-related needs will improve Outcome: Progressing   Problem: Activity: Goal: Risk for activity intolerance will decrease Outcome: Progressing   Problem: Pain Managment: Goal: General experience of comfort will improve Outcome: Progressing   Problem: Pain Management: Goal: Pain level will decrease with appropriate interventions Outcome: Progressing   
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
## 2021-08-19 NOTE — Progress Notes (Signed)
Subjective: 1 Day Post-Op Procedure(s) (LRB): TOTAL HIP ARTHROPLASTY ANTERIOR APPROACH (Right) Patient reports pain as moderate.  Taking by mouth and voiding okay.  Has not been out of bed yet.  Wants to go home today if he does okay with ambulation.  Getting relief with IV pain meds.  He has chronic low back pain.  This is somewhat limiting for him.  Objective: Vital signs in last 24 hours: Temp:  [97.7 F (36.5 C)-98.5 F (36.9 C)] 98 F (36.7 C) (01/14 0601) Pulse Rate:  [64-94] 68 (01/14 0601) Resp:  [12-23] 14 (01/14 0601) BP: (100-149)/(56-99) 111/61 (01/14 0601) SpO2:  [91 %-100 %] 92 % (01/14 0601) Weight:  [129.9 kg] 129.9 kg (01/13 1224)  Intake/Output from previous day: 01/13 0701 - 01/14 0700 In: 4659.7 [P.O.:1680; I.V.:1999.7; IV Piggyback:980] Out: 3600 [Urine:2650; Blood:950] Intake/Output this shift: No intake/output data recorded.  Recent Labs    08/19/21 0328  HGB 8.5*   Recent Labs    08/19/21 0328  WBC 8.8  RBC 3.16*  HCT 28.8*  PLT 207   Recent Labs    08/19/21 0328  NA 141  K 3.4*  CL 99  CO2 36*  BUN 8  CREATININE 0.82  GLUCOSE 161*  CALCIUM 8.7*   No results for input(s): LABPT, INR in the last 72 hours. Right hip exam:  Neurovascular intact Sensation intact distally Intact pulses distally Dorsiflexion/Plantar flexion intact Incision: dressing C/D/I No cellulitis present   Assessment/Plan: 1 Day Post-Op Procedure(s) (LRB): TOTAL HIP ARTHROPLASTY ANTERIOR APPROACH (Right) Plan: Up with therapy.  Weight-bear as tolerated on right.  No hip precautions. Continue on Plavix and aspirin which he was on chronically.  This will be for DVT prophylaxis Discharge home today if he passes physical therapy. Follow-up with Dr. Berenice Primas in 2 weeks.    Erlene Senters 08/19/2021, 8:57 AM

## 2021-08-20 LAB — CBC
HCT: 28.2 % — ABNORMAL LOW (ref 39.0–52.0)
Hemoglobin: 8.3 g/dL — ABNORMAL LOW (ref 13.0–17.0)
MCH: 27.2 pg (ref 26.0–34.0)
MCHC: 29.4 g/dL — ABNORMAL LOW (ref 30.0–36.0)
MCV: 92.5 fL (ref 80.0–100.0)
Platelets: 163 10*3/uL (ref 150–400)
RBC: 3.05 MIL/uL — ABNORMAL LOW (ref 4.22–5.81)
RDW: 14.9 % (ref 11.5–15.5)
WBC: 8.7 10*3/uL (ref 4.0–10.5)
nRBC: 0 % (ref 0.0–0.2)

## 2021-08-20 MED ORDER — FENTANYL 50 MCG/HR TD PT72
1.0000 | MEDICATED_PATCH | TRANSDERMAL | Status: DC
  Administered 2021-08-20: 1 via TRANSDERMAL
  Filled 2021-08-20: qty 1

## 2021-08-20 MED ORDER — FENTANYL 50 MCG/HR TD PT72: 1.0000 | MEDICATED_PATCH | TRANSDERMAL | 0 refills | Status: DC

## 2021-08-20 MED ORDER — BISACODYL 10 MG RE SUPP
10.0000 mg | Freq: Once | RECTAL | Status: AC
  Administered 2021-08-20: 10 mg via RECTAL
  Filled 2021-08-20: qty 1

## 2021-08-20 NOTE — Progress Notes (Signed)
Physical Therapy Treatment Patient Details Name: Jerry [name] MRN: [id number] DOB: May 08, 1951 Today's Date: 08/20/2021   History of Present Illness Pt s/p R THR and with hx of CAD, COPD (home O2@2L ), PVD, BIl TKR, multiple R shoulder surgery, multiple back surgery, chronic pain syndrome and CHF    PT Comments    Pt continues very motivated and cooperative but limited by pain exacerbation with attempts to ambulate.   Recommendations for follow up therapy are one component of a multi-disciplinary discharge planning process, led by the attending physician.  Recommendations may be updated based on patient status, additional functional criteria and insurance authorization.  Follow Up Recommendations  Follow physician's recommendations for discharge plan and follow up therapies     Assistance Recommended at Discharge Frequent or constant Supervision/Assistance  Patient can return home with the following A little help with walking and/or transfers;A little help with bathing/dressing/bathroom;Assistance with cooking/housework;Help with stairs or ramp for entrance   Equipment Recommendations  None recommended by PT    Recommendations for Other Services       Precautions / Restrictions Precautions Precautions: Fall Restrictions Weight Bearing Restrictions: No RLE Weight Bearing: Weight bearing as tolerated     Mobility  Bed Mobility Overal bed mobility: Needs Assistance Bed Mobility: Supine to Sit     Supine to sit: Min assist     General bed mobility comments: Use of rails with cues for sequence, use of L LE to self assist.  Physical assist for R LE    Transfers Overall transfer level: Needs assistance Equipment used: Rolling walker (2 wheels) Transfers: Sit to/from Stand Sit to Stand: Min guard;From elevated surface                Ambulation/Gait Ambulation/Gait assistance: Min assist;+2 safety/equipment Gait Distance (Feet): 12 Feet Assistive device:  Rolling walker (2 wheels) Gait Pattern/deviations: Step-to pattern;Decreased step length - right;Decreased step length - left;Shuffle;Trunk flexed Gait velocity: decr     General Gait Details: cues for sequence, posture and position from [employer]             Wheelchair Mobility    Modified Rankin (Stroke Patients Only)       Balance Overall balance assessment: Needs assistance Sitting-balance support: No upper extremity supported;Feet supported Sitting balance-Leahy Scale: Good     Standing balance support: No upper extremity supported Standing balance-Leahy Scale: Fair                              Cognition Arousal/Alertness: Awake/alert Behavior During Therapy: WFL for tasks assessed/performed Overall Cognitive Status: Within Functional Limits for tasks assessed                                          Exercises      General Comments        Pertinent Vitals/Pain Pain Assessment: 0-10 Pain Score: 9  Pain Location: R hip and thigh with WB Pain Descriptors / Indicators: Aching;Grimacing;Guarding Pain Intervention(s): Limited activity within patient's tolerance;Monitored during session;Premedicated before session;Ice applied    Home Living                          Prior Function            PT Goals (current goals can now be found in  the care plan section) Acute Rehab PT Goals Patient Stated Goal: Regain IND PT Goal Formulation: With patient Time For Goal Achievement: 08/25/21 Potential to Achieve Goals: Good Progress towards PT goals: Not progressing toward goals - comment (pain ltd)    Frequency    7X/week      PT Plan Current plan remains appropriate    Co-evaluation              AM-PAC PT "6 Clicks" Mobility   Outcome Measure  Help needed turning from your back to your side while in a flat bed without using bedrails?: A Lot Help needed moving from lying on your back to sitting on the  side of a flat bed without using bedrails?: A Lot Help needed moving to and from a bed to a chair (including a wheelchair)?: A Lot Help needed standing up from a chair using your arms (e.g., wheelchair or bedside chair)?: A Little Help needed to walk in hospital room?: Total Help needed climbing 3-5 steps with a railing? : Total 6 Click Score: 11    End of Session Equipment Utilized During Treatment: Oxygen;Gait belt Activity Tolerance: Patient limited by pain Patient left: in chair;with call bell/phone within reach;with chair alarm set;with family/visitor present Nurse Communication: Mobility status PT Visit Diagnosis: Unsteadiness on feet (R26.81);Difficulty in walking, not elsewhere classified (R26.2);Pain Pain - Right/Left: Right Pain - part of body: Hip     Time: 9935-7017 PT Time Calculation (min) (ACUTE ONLY): 15 min  Charges:  $Gait Training: 8-22 mins                     [place] Pager [phone] Office [phone]    Larua Collier 08/20/2021, 5:01 PM

## 2021-08-20 NOTE — Progress Notes (Signed)
Physical Therapy Treatment Patient Details Name: Jerry [name] MRN: [id number] DOB: 08-24-1950 Today's Date: 08/20/2021   History of Present Illness Pt s/p R THR and with hx of CAD, COPD (home O2@2L ), PVD, BIl TKR, multiple R shoulder surgery, multiple back surgery, chronic pain syndrome and CHF    PT Comments    Pt continues very cooperative but with decreased activity tolerance this am 2* increased pain.     Recommendations for follow up therapy are one component of a multi-disciplinary discharge planning process, led by the attending physician.  Recommendations may be updated based on patient status, additional functional criteria and insurance authorization.  Follow Up Recommendations  Follow physician's recommendations for discharge plan and follow up therapies     Assistance Recommended at Discharge Frequent or constant Supervision/Assistance  Patient can return home with the following A little help with walking and/or transfers;A little help with bathing/dressing/bathroom;Assistance with cooking/housework;Help with stairs or ramp for entrance   Equipment Recommendations  None recommended by PT    Recommendations for Other Services       Precautions / Restrictions Precautions Precautions: Fall Restrictions Weight Bearing Restrictions: No RLE Weight Bearing: Weight bearing as tolerated     Mobility  Bed Mobility Overal bed mobility: Needs Assistance Bed Mobility: Supine to Sit     Supine to sit: Min assist     General bed mobility comments: Use of rails with cues for sequence, use of L LE to self assist.  Physical assist for R LE    Transfers Overall transfer level: Needs assistance Equipment used: Rolling walker (2 wheels) Transfers: Sit to/from Stand Sit to Stand: Min guard;From elevated surface           General transfer comment: cues for LE management and use of UEs to self assist.    Ambulation/Gait Ambulation/Gait assistance: Min assist;+2  safety/equipment Gait Distance (Feet): 13 Feet Assistive device: Rolling walker (2 wheels) Gait Pattern/deviations: Step-to pattern;Decreased step length - right;Decreased step length - left;Shuffle;Trunk flexed Gait velocity: decr     General Gait Details: cues for sequence, posture and position from [employer]             Wheelchair Mobility    Modified Rankin (Stroke Patients Only)       Balance Overall balance assessment: Needs assistance Sitting-balance support: No upper extremity supported;Feet supported Sitting balance-Leahy Scale: Good     Standing balance support: Single extremity supported Standing balance-Leahy Scale: Poor                              Cognition Arousal/Alertness: Awake/alert Behavior During Therapy: WFL for tasks assessed/performed Overall Cognitive Status: Within Functional Limits for tasks assessed                                          Exercises Total Joint Exercises Ankle Circles/Pumps: AROM;Both;15 reps;Supine Quad Sets: AROM;Both;10 reps;Supine Heel Slides: AAROM;Right;15 reps;Supine Hip ABduction/ADduction: AAROM;Right;15 reps;Supine Long Arc Quad: AAROM;AROM;Right;10 reps;Seated    General Comments        Pertinent Vitals/Pain Pain Assessment: 0-10 Pain Score: 9  Pain Location: R hip and thigh Pain Descriptors / Indicators: Aching;Grimacing;Guarding Pain Intervention(s): Limited activity within patient's tolerance;Monitored during session;Premedicated before session;Patient requesting pain meds-RN notified;RN gave pain meds during session;Ice applied    Home Living  Prior Function            PT Goals (current goals can now be found in the care plan section) Acute Rehab PT Goals Patient Stated Goal: Regain IND PT Goal Formulation: With patient Time For Goal Achievement: 08/25/21 Potential to Achieve Goals: Good Progress towards PT goals: Not  progressing toward goals - comment (Increased pain this am)    Frequency    7X/week      PT Plan Current plan remains appropriate    Co-evaluation              AM-PAC PT "6 Clicks" Mobility   Outcome Measure  Help needed turning from your back to your side while in a flat bed without using bedrails?: A Lot Help needed moving from lying on your back to sitting on the side of a flat bed without using bedrails?: A Lot Help needed moving to and from a bed to a chair (including a wheelchair)?: A Lot Help needed standing up from a chair using your arms (e.g., wheelchair or bedside chair)?: A Lot Help needed to walk in hospital room?: Total Help needed climbing 3-5 steps with a railing? : Total 6 Click Score: 10    End of Session Equipment Utilized During Treatment: Oxygen;Gait belt Activity Tolerance: Patient limited by pain Patient left: in chair;with call bell/phone within reach;with chair alarm set;with nursing/sitter in room Nurse Communication: Mobility status PT Visit Diagnosis: Unsteadiness on feet (R26.81);Difficulty in walking, not elsewhere classified (R26.2);Pain Pain - Right/Left: Right Pain - part of body: Hip     Time: 0813-0850 PT Time Calculation (min) (ACUTE ONLY): 37 min  Charges:  $Gait Training: 8-22 mins $Therapeutic Exercise: 8-22 mins                     [place] Pager [phone] Office [phone]    Nandan Willems 08/20/2021, 8:55 AM

## 2021-08-20 NOTE — Plan of Care (Signed)
°  Problem: Education: Goal: Knowledge of General Education information will improve Description: Including pain rating scale, medication(s)/side effects and non-pharmacologic comfort measures Outcome: Progressing   Problem: Education: Goal: Knowledge of the prescribed therapeutic regimen will improve Outcome: Progressing   Problem: Clinical Measurements: Goal: Postoperative complications will be avoided or minimized Outcome: Progressing   Problem: Pain Management: Goal: Pain level will decrease with appropriate interventions Outcome: Progressing

## 2021-08-20 NOTE — Progress Notes (Addendum)
Subjective: 2 Days Post-Op Procedure(s) (LRB): TOTAL HIP ARTHROPLASTY ANTERIOR APPROACH (Right) Patient reports pain as moderate.  Made slow progress with physical therapy yesterday due to right hip pain.  He does complain of some constipation and would like a suppository.  He is on chronic pain management with a morphine pump and he has been using some IV Dilaudid here for pain control.  He would like to go home later today.  He has a friend with him who can help him get in the house at home and help him move about the house.  Objective: Vital signs in last 24 hours: Temp:  [97.7 F (36.5 C)-98 F (36.7 C)] 98 F (36.7 C) (01/15 0844) Pulse Rate:  [68-75] 72 (01/15 0844) Resp:  [16-20] 18 (01/15 0844) BP: (119-159)/(46-65) 159/65 (01/15 0844) SpO2:  [95 %-100 %] 100 % (01/15 0844)  Intake/Output from previous day: 01/14 0701 - 01/15 0700 In: 1400.5 [P.O.:1120; I.V.:280.5] Out: 2495 [Urine:2495] Intake/Output this shift: Total I/O In: 240 [P.O.:240] Out: 900 [Urine:900]  Recent Labs    08/19/21 0328 08/20/21 0303  HGB 8.5* 8.3*   Recent Labs    08/19/21 0328 08/20/21 0303  WBC 8.8 8.7  RBC 3.16* 3.05*  HCT 28.8* 28.2*  PLT 207 163   Recent Labs    08/19/21 0328  NA 141  K 3.4*  CL 99  CO2 36*  BUN 8  CREATININE 0.82  GLUCOSE 161*  CALCIUM 8.7*   No results for input(s): LABPT, INR in the last 72 hours. Right hip exam: Minimal swelling of the thigh.  His right hip dressing is clean and dry.  Right calf is soft and nontender.  He has normal plantar and dorsiflexion of his right foot.     Assessment/Plan: 2 Days Post-Op Procedure(s) (LRB): TOTAL HIP ARTHROPLASTY ANTERIOR APPROACH (Right) Chronic pain. Plan: Will add a Duragesic 50 mcg patch here in the hospital and will send him an Rx for this as well. Do therapy this afternoon then discharged home weightbearing as tolerated on the right. He will continue Plavix and aspirin which she is on chronically.   This will be for DVT prophylaxis. Follow-up with Dr. Berenice Primas in 10 to 14 days.   Anticipated LOS equal to or greater than 2 midnights due to - Age 71 and older with one or more of the following:  - Obesity  - Expected need for hospital services (PT, OT, Nursing) required for safe  discharge  - Anticipated need for postoperative skilled nursing care or inpatient rehab  - Active co-morbidities: Chronic pain requiring opiods OR   - Unanticipated findings during/Post Surgery: Slow post-op progression: GI, pain control, mobility  - Patient is a high risk of re-admission due to: None   Erlene Senters 08/20/2021, 10:43 AM

## 2021-08-20 NOTE — Plan of Care (Signed)
°  Problem: Health Behavior/Discharge Planning: Goal: Ability to manage health-related needs will improve Outcome: Progressing   Problem: Clinical Measurements: Goal: Will remain free from infection Outcome: Progressing   Problem: Activity: Goal: Risk for activity intolerance will decrease Outcome: Progressing   Problem: Nutrition: Goal: Adequate nutrition will be maintained Outcome: Progressing   Problem: Coping: Goal: Level of anxiety will decrease Outcome: Progressing   Problem: Elimination: Goal: Will not experience complications related to bowel motility Outcome: Progressing   Problem: Pain Managment: Goal: General experience of comfort will improve Outcome: Progressing   Problem: Safety: Goal: Ability to remain free from injury will improve Outcome: Progressing   Problem: Skin Integrity: Goal: Risk for impaired skin integrity will decrease Outcome: Progressing

## 2021-08-21 DIAGNOSIS — D62 Acute posthemorrhagic anemia: Secondary | ICD-10-CM | POA: Diagnosis not present

## 2021-08-21 DIAGNOSIS — F32A Depression, unspecified: Secondary | ICD-10-CM | POA: Diagnosis present

## 2021-08-21 DIAGNOSIS — Z7951 Long term (current) use of inhaled steroids: Secondary | ICD-10-CM | POA: Diagnosis not present

## 2021-08-21 DIAGNOSIS — M1611 Unilateral primary osteoarthritis, right hip: Secondary | ICD-10-CM | POA: Diagnosis present

## 2021-08-21 DIAGNOSIS — Z7982 Long term (current) use of aspirin: Secondary | ICD-10-CM | POA: Diagnosis not present

## 2021-08-21 DIAGNOSIS — R7303 Prediabetes: Secondary | ICD-10-CM | POA: Diagnosis present

## 2021-08-21 DIAGNOSIS — Z881 Allergy status to other antibiotic agents status: Secondary | ICD-10-CM | POA: Diagnosis not present

## 2021-08-21 DIAGNOSIS — J449 Chronic obstructive pulmonary disease, unspecified: Secondary | ICD-10-CM | POA: Diagnosis present

## 2021-08-21 DIAGNOSIS — E785 Hyperlipidemia, unspecified: Secondary | ICD-10-CM | POA: Diagnosis present

## 2021-08-21 DIAGNOSIS — Z7902 Long term (current) use of antithrombotics/antiplatelets: Secondary | ICD-10-CM | POA: Diagnosis not present

## 2021-08-21 DIAGNOSIS — Z880 Allergy status to penicillin: Secondary | ICD-10-CM | POA: Diagnosis not present

## 2021-08-21 DIAGNOSIS — K219 Gastro-esophageal reflux disease without esophagitis: Secondary | ICD-10-CM | POA: Diagnosis present

## 2021-08-21 DIAGNOSIS — Z955 Presence of coronary angioplasty implant and graft: Secondary | ICD-10-CM | POA: Diagnosis not present

## 2021-08-21 DIAGNOSIS — I739 Peripheral vascular disease, unspecified: Secondary | ICD-10-CM | POA: Diagnosis present

## 2021-08-21 DIAGNOSIS — Z96653 Presence of artificial knee joint, bilateral: Secondary | ICD-10-CM | POA: Diagnosis present

## 2021-08-21 DIAGNOSIS — Z87891 Personal history of nicotine dependence: Secondary | ICD-10-CM | POA: Diagnosis not present

## 2021-08-21 DIAGNOSIS — I2510 Atherosclerotic heart disease of native coronary artery without angina pectoris: Secondary | ICD-10-CM | POA: Diagnosis present

## 2021-08-21 DIAGNOSIS — G894 Chronic pain syndrome: Secondary | ICD-10-CM | POA: Diagnosis present

## 2021-08-21 DIAGNOSIS — Z96641 Presence of right artificial hip joint: Secondary | ICD-10-CM

## 2021-08-21 DIAGNOSIS — Z79899 Other long term (current) drug therapy: Secondary | ICD-10-CM | POA: Diagnosis not present

## 2021-08-21 DIAGNOSIS — I110 Hypertensive heart disease with heart failure: Secondary | ICD-10-CM | POA: Diagnosis present

## 2021-08-21 DIAGNOSIS — I421 Obstructive hypertrophic cardiomyopathy: Secondary | ICD-10-CM | POA: Diagnosis present

## 2021-08-21 DIAGNOSIS — I5032 Chronic diastolic (congestive) heart failure: Secondary | ICD-10-CM | POA: Diagnosis present

## 2021-08-21 DIAGNOSIS — Z888 Allergy status to other drugs, medicaments and biological substances status: Secondary | ICD-10-CM | POA: Diagnosis not present

## 2021-08-21 LAB — CBC
HCT: 23.7 % — ABNORMAL LOW (ref 39.0–52.0)
Hemoglobin: 7 g/dL — ABNORMAL LOW (ref 13.0–17.0)
MCH: 27 pg (ref 26.0–34.0)
MCHC: 29.5 g/dL — ABNORMAL LOW (ref 30.0–36.0)
MCV: 91.5 fL (ref 80.0–100.0)
Platelets: 180 10*3/uL (ref 150–400)
RBC: 2.59 MIL/uL — ABNORMAL LOW (ref 4.22–5.81)
RDW: 14.9 % (ref 11.5–15.5)
WBC: 7.8 10*3/uL (ref 4.0–10.5)
nRBC: 0 % (ref 0.0–0.2)

## 2021-08-21 LAB — PREPARE RBC (CROSSMATCH)

## 2021-08-21 MED ORDER — SODIUM CHLORIDE 0.9% IV SOLUTION: Freq: Once | INTRAVENOUS | Status: AC

## 2021-08-21 NOTE — Progress Notes (Signed)
PHYSICAL THERAPY  Pt scheduled to receive 2 units of blood this afternoon.  First unit will not be complete until about 5:45pm.  Will see pt EARLY in am.   Pt has a 3:30 MD appointment in [facility] tomorrow he "cannot miss".  Rica Koyanagi  PTA Acute  Rehabilitation Services Pager      [phone] Office      [phone]

## 2021-08-21 NOTE — Progress Notes (Signed)
PA Joanell Rising notified of PT therapy and vital signs.

## 2021-08-21 NOTE — Progress Notes (Signed)
PHYSICAL THERAPY  Pt was symptomatic Hypotension  Supine                         BP 122/72  HR 76 EOB                             BP 128/51  HR 85 Standing                      BP 100/57  HR 87 "light headed" 3 min amb only 4 feet  BP  98/65   HR 108  "I got to sit down"  Pt would benefit from blood transfusion.  Will have a PM PT session and expect to D/C to home later today.  Rica Koyanagi  PTA Acute  Rehabilitation Services Pager      [phone] Office      [phone]

## 2021-08-21 NOTE — Progress Notes (Signed)
PATIENT ID: Jerry [name]  MRN: [id number]  DOB/AGE:  04-11-1951 / 71 y.o.  3 Days Post-Op Procedure(s) (LRB): TOTAL HIP ARTHROPLASTY ANTERIOR APPROACH (Right)    PROGRESS NOTE Subjective: Patient is alert, oriented, no Nausea, no Vomiting, yes passing gas, yes bowel movement x 2. Taking PO well. Denies SOB, Chest or Calf Pain. Using Incentive Spirometer, PAS in place. Ambulate WBAT with pt saying pain is moderate at rest, but severe with movement.  Objective: Vital signs in last 24 hours: Vitals:   08/20/21 1403 08/20/21 2044 08/20/21 2129 08/21/21 0600  BP: (!) 118/55 (!) 134/46 (!) 122/55 132/70  Pulse: 73 64 78 76  Resp: 17 14 16 18   Temp: 98.2 F (36.8 C) 97.8 F (36.6 C) 98.1 F (36.7 C) 98 F (36.7 C)  TempSrc: Oral Oral Oral   SpO2: 96% 95% 94% 96%  Weight:      Height:          Intake/Output from previous day: I/O last 3 completed shifts: In: 1620 [P.O.:1620] Out: 3220 [Urine:4945]   Intake/Output this shift: No intake/output data recorded.   LABORATORY DATA: Recent Labs    08/19/21 0328 08/20/21 0303 08/21/21 0312  WBC 8.8 8.7 7.8  HGB 8.5* 8.3* 7.0*  HCT 28.8* 28.2* 23.7*  PLT 207 163 180  NA 141  --   --   K 3.4*  --   --   CL 99  --   --   CO2 36*  --   --   BUN 8  --   --   CREATININE 0.82  --   --   GLUCOSE 161*  --   --   CALCIUM 8.7*  --   --     Examination: Neurologically intact Neurovascular intact Sensation intact distally Intact pulses distally Dorsiflexion/Plantar flexion intact Incision: dressing C/D/I and scant drainage No cellulitis present Compartment soft} Pt has Hgb of 7 but is not symptomatic.    Assessment:   3 Days Post-Op Procedure(s) (LRB): TOTAL HIP ARTHROPLASTY ANTERIOR APPROACH (Right) ADDITIONAL DIAGNOSIS:  Expected Acute Blood Loss Anemia,  Chronic Pain Syndrome Anticipated LOS equal to or greater than 2 midnights due to - Age 71 and older with one or more of the following:  - Obesity  - Expected need  for hospital services (PT, OT, Nursing) required for safe  discharge  - Anticipated need for postoperative skilled nursing care or inpatient rehab  - Active co-morbidities: Chronic pain requiring opiods, Coronary Artery Disease, and Respiratory Failure/COPD OR   - Unanticipated findings during/Post Surgery: Slow post-op progression: GI, pain control, mobility    Plan: PT/OT WBAT, THA  DVT Prophylaxis: Plavix and ASA  DISCHARGE PLAN: Home, later today once pt passes therapy  DISCHARGE NEEDS: HHPT, Walker, and 3-in-1 comode seat   Will consider transfusion of PRBC's if pt becomes symptomatic with therapy.  Currently states that he is not symptomatic.

## 2021-08-21 NOTE — Progress Notes (Signed)
Physical Therapy Treatment Patient Details Name: Jerry [name] MRN: [id number] DOB: May 23, 1951 Today's Date: 08/21/2021   History of Present Illness Pt s/p R THR and with hx of CAD, COPD (home O2@2L ), PVD, BIl TKR, multiple R shoulder surgery, multiple back surgery, chronic pain syndrome and CHF    PT Comments    POD # 3 am session Assisted OOB to amb while monitoring BP's as pt's HgB dropped to 7.0 Pt was symptomatic Hypotension   Supine                         BP 122/72  HR 76 EOB                             BP 128/51  HR 85 Standing                      BP 100/57  HR 87 "light headed" 3 min amb only 4 feet  BP  98/65   HR 108  "I got to sit down"  Pt would benefit from a blood transfusion and he hopes to D/C to home today because he has a 3:30 MD appt in [facility] tomorrow for his Pain Pump.  Will see pt for a second PT session after his first unit.    Recommendations for follow up therapy are one component of a multi-disciplinary discharge planning process, led by the attending physician.  Recommendations may be updated based on patient status, additional functional criteria and insurance authorization.  Follow Up Recommendations  Follow physician's recommendations for discharge plan and follow up therapies     Assistance Recommended at Discharge Frequent or constant Supervision/Assistance  Patient can return home with the following A little help with walking and/or transfers;A little help with bathing/dressing/bathroom;Assistance with cooking/housework;Help with stairs or ramp for entrance   Equipment Recommendations  None recommended by PT    Recommendations for Other Services       Precautions / Restrictions Precautions Precautions: Fall Restrictions Weight Bearing Restrictions: No Other Position/Activity Restrictions: WBAT     Mobility  Bed Mobility Overal bed mobility: Needs Assistance Bed Mobility: Supine to Sit     Supine to sit: Min guard;Min  assist     General bed mobility comments: demonstrated and instructed on how to use a belt to self assist LE.  Pt able with increased time and use of rail.    Transfers Overall transfer level: Needs assistance Equipment used: Rolling walker (2 wheels) Transfers: Sit to/from Stand Sit to Stand: Min guard;From elevated surface           General transfer comment: cues for LE management and use of UEs to self assist.    Ambulation/Gait Ambulation/Gait assistance: Min assist;+2 safety/equipment Gait Distance (Feet): 4 Feet Assistive device: Rolling walker (2 wheels) Gait Pattern/deviations: Step-to pattern;Decreased step length - right;Decreased step length - left;Shuffle;Trunk flexed Gait velocity: decr     General Gait Details: decreased amb distance due to increased c/o "light headed"   Stairs             Wheelchair Mobility    Modified Rankin (Stroke Patients Only)       Balance  Cognition Arousal/Alertness: Awake/alert Behavior During Therapy: WFL for tasks assessed/performed Overall Cognitive Status: Within Functional Limits for tasks assessed                                 General Comments: AxO x 3 very pleasant and motivated        Exercises      General Comments        Pertinent Vitals/Pain Pain Score: 6  Pain Location: R hip and thigh with WB Pain Descriptors / Indicators: Aching;Grimacing;Guarding;Operative site guarding Pain Intervention(s): Monitored during session;Premedicated before session;Repositioned;Ice applied    Home Living                          Prior Function            PT Goals (current goals can now be found in the care plan section) Progress towards PT goals: Progressing toward goals    Frequency    7X/week      PT Plan Current plan remains appropriate    Co-evaluation              AM-PAC PT "6 Clicks" Mobility    Outcome Measure  Help needed turning from your back to your side while in a flat bed without using bedrails?: A Little Help needed moving from lying on your back to sitting on the side of a flat bed without using bedrails?: A Little Help needed moving to and from a bed to a chair (including a wheelchair)?: A Little Help needed standing up from a chair using your arms (e.g., wheelchair or bedside chair)?: A Little Help needed to walk in hospital room?: A Little Help needed climbing 3-5 steps with a railing? : A Lot 6 Click Score: 17    End of Session Equipment Utilized During Treatment: Oxygen;Gait belt Activity Tolerance: Other (comment) (hypotensive) Patient left: in chair;with call bell/phone within reach;with chair alarm set;with family/visitor present Nurse Communication: Mobility status PT Visit Diagnosis: Unsteadiness on feet (R26.81);Difficulty in walking, not elsewhere classified (R26.2);Pain Pain - Right/Left: Right Pain - part of body: Hip     Time: 1962-2297 PT Time Calculation (min) (ACUTE ONLY): 42 min  Charges:  $Gait Training: 8-22 mins $Therapeutic Activity: 23-37 mins                    Rica Koyanagi  PTA Acute  Rehabilitation Services Pager      [phone] Office      [phone]

## 2021-08-21 NOTE — Progress Notes (Signed)
Blood consent obtained and placed in pt chart.

## 2021-08-22 ENCOUNTER — Inpatient Hospital Stay (HOSPITAL_COMMUNITY): Payer: Medicare HMO

## 2021-08-22 DIAGNOSIS — D62 Acute posthemorrhagic anemia: Secondary | ICD-10-CM | POA: Diagnosis not present

## 2021-08-22 LAB — BPAM RBC
Blood Product Expiration Date: 202302132359
Blood Product Expiration Date: 202302152359
ISSUE DATE / TIME: 202301161431
ISSUE DATE / TIME: 202301161812
Unit Type and Rh: 600
Unit Type and Rh: 9500

## 2021-08-22 LAB — TYPE AND SCREEN
ABO/RH(D): A NEG
Antibody Screen: POSITIVE
Donor AG Type: NEGATIVE
Donor AG Type: NEGATIVE
Unit division: 0
Unit division: 0

## 2021-08-22 LAB — HEMOGLOBIN AND HEMATOCRIT, BLOOD
HCT: 30.2 % — ABNORMAL LOW (ref 39.0–52.0)
Hemoglobin: 9.4 g/dL — ABNORMAL LOW (ref 13.0–17.0)

## 2021-08-22 NOTE — Progress Notes (Signed)
Subjective: 4 Days Post-Op Procedure(s) (LRB): TOTAL HIP ARTHROPLASTY ANTERIOR APPROACH (Right) Patient reports pain as moderate.  Patient reports having more energy than yesterday after receiving 2 units of packed RBCs.  Still complains of pain in his right hip when he is up.Taking by mouth and voiding okay.The patient was seen at 815 this a.m.  Objective: Vital signs in last 24 hours: Temp:  [98 F (36.7 C)-98.5 F (36.9 C)] 98.5 F (36.9 C) (01/17 0556) Pulse Rate:  [76-100] 76 (01/17 0556) Resp:  [18-19] 18 (01/17 0556) BP: (134-168)/(65-78) 168/74 (01/17 0556) SpO2:  [92 %-94 %] 92 % (01/17 0556)  Intake/Output from previous day: 01/16 0701 - 01/17 0700 In: 1319 [P.O.:480; I.V.:182.3; Blood:656.7] Out: 5150 [Urine:5150] Intake/Output this shift: Total I/O In: -  Out: 1350 [Urine:1350]  Recent Labs    08/20/21 0303 08/21/21 0312 08/22/21 0814  HGB 8.3* 7.0* 9.4*   Recent Labs    08/20/21 0303 08/21/21 0312 08/22/21 0814  WBC 8.7 7.8  --   RBC 3.05* 2.59*  --   HCT 28.2* 23.7* 30.2*  PLT 163 180  --    No results for input(s): NA, K, CL, CO2, BUN, CREATININE, GLUCOSE, CALCIUM in the last 72 hours. No results for input(s): LABPT, INR in the last 72 hours. X-ray of the pelvis and right hip taken today shows a well positioned total hip prosthesis without periprosthetic fracture or other complications. Right hip exam: Aqua cell dressing intact.  Right calf is soft and nontender.  Moves foot actively.  Neurovascular status is intact distally.    Assessment/Plan: 4 Days Post-Op Procedure(s) (LRB): TOTAL HIP ARTHROPLASTY ANTERIOR APPROACH (Right) Symptomatic postop blood loss anemia, improved after packed RBC transfusion. Chronic pain. Plan: X-ray was ordered of the pelvis and right hip which showed no postoperative complications. Continue on Plavix and aspirin which he was on preop for DVT prophylaxis. Weight-bear as tolerated on right with a walker. Discharge  home today once he passes physical therapy. Follow-up with Dr. Berenice Primas in 10 days.   Anticipated LOS equal to or greater than 2 midnights due to - Age 36 and older with one or more of the following:  - Obesity  - Expected need for hospital services (PT, OT, Nursing) required for safe  discharge  - Anticipated need for postoperative skilled nursing care or inpatient rehab  - Active co-morbidities: Chronic pain requiring opiods and Anemia OR   - Unanticipated findings during/Post Surgery: Lab abnormalities and Slow post-op progression: GI, pain control, mobility  - Patient is a high risk of re-admission due to: Barriers to post-acute care (logistical, no family support in home)   Jerry [name] 08/22/2021, 5:19 PM

## 2021-08-22 NOTE — Progress Notes (Signed)
Physical Therapy Treatment Patient Details Name: Jerry [name] MRN: [id number] DOB: [birth year] Today's Date: 08/22/2021   History of Present Illness Pt s/p R THR and with hx of CAD, COPD (home O2'@2L' ), PVD, BIl TKR, multiple R shoulder surgery, multiple back surgery, chronic pain syndrome and CHF    PT Comments    POD # 4 pm session after DG Pelvic complete Pt feeling much better and stronger after 2 units blood yesterday.  Pt self able to get OOB.  General transfer comment: pt self able to rise from elevated bed and BSC with forward weight shift.  Practiced 2 steps B rails with heavy lean on rails but able at Supervision level.  Assisted to [facility] pt request poss BM.  Assisted back to bed per pt request to rest before D/C later today. Pt has handout HEP and educated on use of ICE. Pt is ready for D/C to home.   Recommendations for follow up therapy are one component of a multi-disciplinary discharge planning process, led by the attending physician.  Recommendations may be updated based on patient status, additional functional criteria and insurance authorization.  Follow Up Recommendations  Follow physician's recommendations for discharge plan and follow up therapies     Assistance Recommended at Discharge Frequent or constant Supervision/Assistance  Patient can return home with the following A little help with walking and/or transfers;A little help with bathing/dressing/bathroom;Assistance with cooking/housework;Help with stairs or ramp for entrance   Equipment Recommendations  None recommended by PT    Recommendations for Other Services       Precautions / Restrictions Precautions Precautions: Fall Precaution Comments: Home oxygen 3 lts per pt Restrictions Weight Bearing Restrictions: No Other Position/Activity Restrictions: WBAT     Mobility  Bed Mobility Overal bed mobility: Modified Independent             General bed mobility comments: pt self able with increased  time "let me show you"    Transfers Overall transfer level: Needs assistance Equipment used: Rolling walker (2 wheels) Transfers: Sit to/from Stand Sit to Stand: Supervision           General transfer comment: pt self able to rise from elevated bed and BSC with forward weight shift    Ambulation/Gait Ambulation/Gait assistance: Supervision Gait Distance (Feet): 22 Feet (11 feet x 2 to and from stairs) Assistive device: Rolling walker (2 wheels) Gait Pattern/deviations: Step-to pattern, Decreased step length - right, Decreased step length - left, Shuffle, Trunk flexed Gait velocity: decr     General Gait Details: tolerated an increased distance around room to and from stairs and BSC with bari walker and increased time.  Pt feeling much better than yesterday.   Stairs             Wheelchair Mobility    Modified Rankin (Stroke Patients Only)       Balance                                            Cognition Arousal/Alertness: Awake/alert Behavior During Therapy: WFL for tasks assessed/performed Overall Cognitive Status: Within Functional Limits for tasks assessed                                 General Comments: AxO x 3 very pleasant and motivated feeling "much better"  Exercises      General Comments        Pertinent Vitals/Pain Pain Assessment Pain Assessment: 0-10 Pain Score: 7  Pain Location: R hip and thigh with amb Pain Descriptors / Indicators: Aching, Grimacing, Guarding, Operative site guarding Pain Intervention(s): Monitored during session, Premedicated before session, Repositioned, Ice applied    Home Living                          Prior Function            PT Goals (current goals can now be found in the care plan section) Progress towards PT goals: Progressing toward goals    Frequency    7X/week      PT Plan Current plan remains appropriate    Co-evaluation               AM-PAC PT "6 Clicks" Mobility   Outcome Measure  Help needed turning from your back to your side while in a flat bed without using bedrails?: None Help needed moving from lying on your back to sitting on the side of a flat bed without using bedrails?: None Help needed moving to and from a bed to a chair (including a wheelchair)?: None Help needed standing up from a chair using your arms (e.g., wheelchair or bedside chair)?: None Help needed to walk in hospital room?: A Little Help needed climbing 3-5 steps with a railing? : A Little 6 Click Score: 22    End of Session Equipment Utilized During Treatment: Oxygen;Gait belt Activity Tolerance: Patient tolerated treatment well Patient left: in bed;with call bell/phone within reach Nurse Communication: Mobility status (pt has met his mobility goals to D/C to home today) PT Visit Diagnosis: Unsteadiness on feet (R26.81);Difficulty in walking, not elsewhere classified (R26.2);Pain Pain - Right/Left: Right Pain - part of body: Hip     Time: 5051-8335 PT Time Calculation (min) (ACUTE ONLY): 29 min  Charges:  $Gait Training: 8-22 mins $Therapeutic Activity: 8-22 mins                     {Jabriel Vanduyne  PTA Acute  Rehabilitation Services Pager      [phone] Office      [phone]

## 2021-08-23 ENCOUNTER — Encounter (HOSPITAL_COMMUNITY): Payer: Self-pay | Admitting: Orthopedic Surgery

## 2021-09-27 ENCOUNTER — Ambulatory Visit: Payer: Medicare HMO | Admitting: Cardiovascular Disease

## 2021-11-14 ENCOUNTER — Other Ambulatory Visit: Payer: Self-pay | Admitting: Student

## 2021-11-16 ENCOUNTER — Other Ambulatory Visit: Payer: Self-pay | Admitting: Cardiovascular Disease

## 2021-12-01 ENCOUNTER — Other Ambulatory Visit: Payer: Self-pay | Admitting: Cardiovascular Disease

## 2021-12-22 ENCOUNTER — Other Ambulatory Visit: Payer: Self-pay | Admitting: Cardiovascular Disease

## 2022-02-08 ENCOUNTER — Other Ambulatory Visit: Payer: Self-pay | Admitting: Cardiovascular Disease

## 2022-04-02 ENCOUNTER — Other Ambulatory Visit: Payer: Self-pay | Admitting: Cardiovascular Disease

## 2022-04-28 ENCOUNTER — Other Ambulatory Visit: Payer: Self-pay | Admitting: Cardiovascular Disease

## 2022-05-15 ENCOUNTER — Emergency Department (HOSPITAL_COMMUNITY): Payer: Medicare Other

## 2022-05-15 ENCOUNTER — Emergency Department (HOSPITAL_COMMUNITY): Payer: Medicare Other | Admitting: Anesthesiology

## 2022-05-15 ENCOUNTER — Emergency Department (EMERGENCY_DEPARTMENT_HOSPITAL): Payer: Medicare Other | Admitting: Anesthesiology

## 2022-05-15 ENCOUNTER — Encounter (HOSPITAL_COMMUNITY)
Admission: EM | Disposition: A | Discharge status: Home / Self Care | Payer: Self-pay | Source: Home / Self Care | Attending: Internal Medicine

## 2022-05-15 ENCOUNTER — Inpatient Hospital Stay (HOSPITAL_COMMUNITY)
Admission: EM | Admit: 2022-05-15 | Discharge: 2022-05-17 | DRG: 281 | Disposition: A | Discharge status: Home / Self Care | Payer: Medicare Other | Attending: Internal Medicine | Admitting: Internal Medicine

## 2022-05-15 DIAGNOSIS — Z7902 Long term (current) use of antithrombotics/antiplatelets: Secondary | ICD-10-CM | POA: Diagnosis not present

## 2022-05-15 DIAGNOSIS — Z825 Family history of asthma and other chronic lower respiratory diseases: Secondary | ICD-10-CM | POA: Diagnosis not present

## 2022-05-15 DIAGNOSIS — I739 Peripheral vascular disease, unspecified: Secondary | ICD-10-CM | POA: Diagnosis not present

## 2022-05-15 DIAGNOSIS — D649 Anemia, unspecified: Secondary | ICD-10-CM | POA: Diagnosis present

## 2022-05-15 DIAGNOSIS — J9611 Chronic respiratory failure with hypoxia: Secondary | ICD-10-CM | POA: Diagnosis present

## 2022-05-15 DIAGNOSIS — Z85830 Personal history of malignant neoplasm of bone: Secondary | ICD-10-CM

## 2022-05-15 DIAGNOSIS — F418 Other specified anxiety disorders: Secondary | ICD-10-CM

## 2022-05-15 DIAGNOSIS — M961 Postlaminectomy syndrome, not elsewhere classified: Secondary | ICD-10-CM | POA: Diagnosis not present

## 2022-05-15 DIAGNOSIS — I700 Atherosclerosis of aorta: Secondary | ICD-10-CM | POA: Diagnosis not present

## 2022-05-15 DIAGNOSIS — Z888 Allergy status to other drugs, medicaments and biological substances status: Secondary | ICD-10-CM

## 2022-05-15 DIAGNOSIS — R000 Tachycardia, unspecified: Secondary | ICD-10-CM | POA: Diagnosis not present

## 2022-05-15 DIAGNOSIS — Z8249 Family history of ischemic heart disease and other diseases of the circulatory system: Secondary | ICD-10-CM | POA: Diagnosis not present

## 2022-05-15 DIAGNOSIS — N400 Enlarged prostate without lower urinary tract symptoms: Secondary | ICD-10-CM | POA: Diagnosis not present

## 2022-05-15 DIAGNOSIS — Z7982 Long term (current) use of aspirin: Secondary | ICD-10-CM | POA: Diagnosis not present

## 2022-05-15 DIAGNOSIS — E876 Hypokalemia: Secondary | ICD-10-CM | POA: Diagnosis present

## 2022-05-15 DIAGNOSIS — I421 Obstructive hypertrophic cardiomyopathy: Secondary | ICD-10-CM | POA: Diagnosis not present

## 2022-05-15 DIAGNOSIS — G894 Chronic pain syndrome: Secondary | ICD-10-CM | POA: Diagnosis present

## 2022-05-15 DIAGNOSIS — R079 Chest pain, unspecified: Principal | ICD-10-CM | POA: Diagnosis present

## 2022-05-15 DIAGNOSIS — I5032 Chronic diastolic (congestive) heart failure: Secondary | ICD-10-CM | POA: Diagnosis present

## 2022-05-15 DIAGNOSIS — Z885 Allergy status to narcotic agent status: Secondary | ICD-10-CM

## 2022-05-15 DIAGNOSIS — F32A Depression, unspecified: Secondary | ICD-10-CM | POA: Diagnosis present

## 2022-05-15 DIAGNOSIS — Z87891 Personal history of nicotine dependence: Secondary | ICD-10-CM | POA: Diagnosis not present

## 2022-05-15 DIAGNOSIS — Z96653 Presence of artificial knee joint, bilateral: Secondary | ICD-10-CM | POA: Diagnosis not present

## 2022-05-15 DIAGNOSIS — K5900 Constipation, unspecified: Secondary | ICD-10-CM | POA: Diagnosis present

## 2022-05-15 DIAGNOSIS — Z833 Family history of diabetes mellitus: Secondary | ICD-10-CM

## 2022-05-15 DIAGNOSIS — J449 Chronic obstructive pulmonary disease, unspecified: Secondary | ICD-10-CM

## 2022-05-15 DIAGNOSIS — I10 Essential (primary) hypertension: Secondary | ICD-10-CM | POA: Diagnosis not present

## 2022-05-15 DIAGNOSIS — Z96641 Presence of right artificial hip joint: Secondary | ICD-10-CM | POA: Diagnosis present

## 2022-05-15 DIAGNOSIS — I2510 Atherosclerotic heart disease of native coronary artery without angina pectoris: Secondary | ICD-10-CM

## 2022-05-15 DIAGNOSIS — K219 Gastro-esophageal reflux disease without esophagitis: Secondary | ICD-10-CM | POA: Diagnosis present

## 2022-05-15 DIAGNOSIS — I214 Non-ST elevation (NSTEMI) myocardial infarction: Secondary | ICD-10-CM | POA: Diagnosis not present

## 2022-05-15 DIAGNOSIS — Z6834 Body mass index (BMI) 34.0-34.9, adult: Secondary | ICD-10-CM

## 2022-05-15 DIAGNOSIS — Z955 Presence of coronary angioplasty implant and graft: Secondary | ICD-10-CM

## 2022-05-15 DIAGNOSIS — F419 Anxiety disorder, unspecified: Secondary | ICD-10-CM | POA: Diagnosis present

## 2022-05-15 DIAGNOSIS — I2489 Other forms of acute ischemic heart disease: Secondary | ICD-10-CM | POA: Diagnosis present

## 2022-05-15 DIAGNOSIS — I110 Hypertensive heart disease with heart failure: Secondary | ICD-10-CM | POA: Diagnosis present

## 2022-05-15 DIAGNOSIS — E785 Hyperlipidemia, unspecified: Secondary | ICD-10-CM | POA: Diagnosis not present

## 2022-05-15 DIAGNOSIS — R0902 Hypoxemia: Secondary | ICD-10-CM

## 2022-05-15 DIAGNOSIS — Z79899 Other long term (current) drug therapy: Secondary | ICD-10-CM

## 2022-05-15 DIAGNOSIS — Z880 Allergy status to penicillin: Secondary | ICD-10-CM

## 2022-05-15 DIAGNOSIS — Z9981 Dependence on supplemental oxygen: Secondary | ICD-10-CM

## 2022-05-15 HISTORY — PX: RADIOLOGY WITH ANESTHESIA: SHX6223

## 2022-05-15 LAB — CBC WITH DIFFERENTIAL/PLATELET
Abs Immature Granulocytes: 0.01 10*3/uL (ref 0.00–0.07)
Basophils Absolute: 0 10*3/uL (ref 0.0–0.1)
Basophils Relative: 0 %
Eosinophils Absolute: 0.1 10*3/uL (ref 0.0–0.5)
Eosinophils Relative: 1 %
HCT: 32.3 % — ABNORMAL LOW (ref 39.0–52.0)
Hemoglobin: 9.9 g/dL — ABNORMAL LOW (ref 13.0–17.0)
Immature Granulocytes: 0 %
Lymphocytes Relative: 8 %
Lymphs Abs: 0.5 10*3/uL — ABNORMAL LOW (ref 0.7–4.0)
MCH: 26 pg (ref 26.0–34.0)
MCHC: 30.7 g/dL (ref 30.0–36.0)
MCV: 84.8 fL (ref 80.0–100.0)
Monocytes Absolute: 0.1 10*3/uL (ref 0.1–1.0)
Monocytes Relative: 1 %
Neutro Abs: 5.1 10*3/uL (ref 1.7–7.7)
Neutrophils Relative %: 90 %
Platelets: 175 10*3/uL (ref 150–400)
RBC: 3.81 MIL/uL — ABNORMAL LOW (ref 4.22–5.81)
RDW: 14.5 % (ref 11.5–15.5)
WBC: 5.7 10*3/uL (ref 4.0–10.5)
nRBC: 0 % (ref 0.0–0.2)

## 2022-05-15 LAB — COMPREHENSIVE METABOLIC PANEL
ALT: 17 U/L (ref 0–44)
AST: 34 U/L (ref 15–41)
Albumin: 3.6 g/dL (ref 3.5–5.0)
Alkaline Phosphatase: 60 U/L (ref 38–126)
Anion gap: 15 (ref 5–15)
BUN: 9 mg/dL (ref 8–23)
CO2: 27 mmol/L (ref 22–32)
Calcium: 9.3 mg/dL (ref 8.9–10.3)
Chloride: 92 mmol/L — ABNORMAL LOW (ref 98–111)
Creatinine, Ser: 1.05 mg/dL (ref 0.61–1.24)
GFR, Estimated: >60 mL/min (ref >60)
Glucose, Bld: 175 mg/dL — ABNORMAL HIGH (ref 70–99)
Potassium: 3.1 mmol/L — ABNORMAL LOW (ref 3.5–5.1)
Sodium: 134 mmol/L — ABNORMAL LOW (ref 135–145)
Total Bilirubin: 0.6 mg/dL (ref 0.3–1.2)
Total Protein: 6.8 g/dL (ref 6.5–8.1)

## 2022-05-15 LAB — MAGNESIUM: Magnesium: 1.4 mg/dL — ABNORMAL LOW (ref 1.7–2.4)

## 2022-05-15 LAB — D-DIMER, QUANTITATIVE: D-Dimer, Quant: 0.81 ug/mL-FEU — ABNORMAL HIGH (ref 0.00–0.50)

## 2022-05-15 LAB — LACTIC ACID, PLASMA
Lactic Acid, Venous: 2 mmol/L (ref 0.5–1.9)
Lactic Acid, Venous: 2.9 mmol/L (ref 0.5–1.9)

## 2022-05-15 LAB — HIV ANTIBODY (ROUTINE TESTING W REFLEX): HIV Screen 4th Generation wRfx: NONREACTIVE

## 2022-05-15 LAB — TSH: TSH: 0.653 u[IU]/mL (ref 0.350–4.500)

## 2022-05-15 LAB — TROPONIN I (HIGH SENSITIVITY)
Troponin I (High Sensitivity): 13 ng/L (ref <18)
Troponin I (High Sensitivity): 587 ng/L (ref <18)

## 2022-05-15 LAB — BRAIN NATRIURETIC PEPTIDE: B Natriuretic Peptide: 12.2 pg/mL (ref 0.0–100.0)

## 2022-05-15 SURGERY — CT WITH ANESTHESIA
Anesthesia: Monitor Anesthesia Care

## 2022-05-15 MED ORDER — NORTRIPTYLINE HCL 10 MG PO CAPS
40.0000 mg | ORAL_CAPSULE | Freq: Every day | ORAL | Status: DC
  Administered 2022-05-15 – 2022-05-16 (×2): 40 mg via ORAL
  Filled 2022-05-15 (×4): qty 4

## 2022-05-15 MED ORDER — TAMSULOSIN HCL 0.4 MG PO CAPS
0.8000 mg | ORAL_CAPSULE | Freq: Every morning | ORAL | Status: DC
  Administered 2022-05-16 – 2022-05-17 (×2): 0.8 mg via ORAL
  Filled 2022-05-15 (×2): qty 2

## 2022-05-15 MED ORDER — DEXMEDETOMIDINE HCL IN NACL 80 MCG/20ML IV SOLN
INTRAVENOUS | Status: DC | PRN
  Administered 2022-05-15 (×2): 4 ug via BUCCAL
  Administered 2022-05-15: 8 ug via BUCCAL
  Administered 2022-05-15: 12 ug via BUCCAL

## 2022-05-15 MED ORDER — ENOXAPARIN SODIUM 120 MG/0.8ML IJ SOSY
120.0000 mg | PREFILLED_SYRINGE | Freq: Two times a day (BID) | INTRAMUSCULAR | Status: DC
  Filled 2022-05-15 (×2): qty 0.8

## 2022-05-15 MED ORDER — PANTOPRAZOLE SODIUM 40 MG PO TBEC
40.0000 mg | DELAYED_RELEASE_TABLET | Freq: Two times a day (BID) | ORAL | Status: DC
  Administered 2022-05-15 – 2022-05-17 (×4): 40 mg via ORAL
  Filled 2022-05-15 (×4): qty 1

## 2022-05-15 MED ORDER — MAGNESIUM OXIDE -MG SUPPLEMENT 400 (240 MG) MG PO TABS: 800.0000 mg | ORAL_TABLET | Freq: Once | ORAL | Status: DC

## 2022-05-15 MED ORDER — UMECLIDINIUM BROMIDE 62.5 MCG/ACT IN AEPB
1.0000 | INHALATION_SPRAY | Freq: Every day | RESPIRATORY_TRACT | Status: DC
  Filled 2022-05-15 (×2): qty 7

## 2022-05-15 MED ORDER — PROPOFOL 10 MG/ML IV BOLUS
INTRAVENOUS | Status: DC | PRN
  Administered 2022-05-15 (×2): 10 mg via INTRAVENOUS
  Administered 2022-05-15: 30 mg via INTRAVENOUS
  Administered 2022-05-15: 10 mg via INTRAVENOUS

## 2022-05-15 MED ORDER — POTASSIUM CHLORIDE CRYS ER 20 MEQ PO TBCR
40.0000 meq | EXTENDED_RELEASE_TABLET | Freq: Four times a day (QID) | ORAL | Status: AC
  Administered 2022-05-15 (×2): 40 meq via ORAL
  Filled 2022-05-15 (×2): qty 2

## 2022-05-15 MED ORDER — ONDANSETRON HCL 4 MG/2ML IJ SOLN
4.0000 mg | Freq: Once | INTRAMUSCULAR | Status: AC
  Administered 2022-05-15: 4 mg via INTRAVENOUS
  Filled 2022-05-15: qty 2

## 2022-05-15 MED ORDER — POTASSIUM CHLORIDE 10 MEQ/100ML IV SOLN: 10.0000 meq | INTRAVENOUS | Status: DC

## 2022-05-15 MED ORDER — IOHEXOL 350 MG/ML SOLN
100.0000 mL | Freq: Once | INTRAVENOUS | Status: AC | PRN
  Administered 2022-05-15: 100 mL via INTRAVENOUS

## 2022-05-15 MED ORDER — HYDROMORPHONE HCL 2 MG PO TABS
8.0000 mg | ORAL_TABLET | Freq: Three times a day (TID) | ORAL | Status: DC | PRN
  Administered 2022-05-15 – 2022-05-16 (×4): 8 mg via ORAL
  Filled 2022-05-15 (×4): qty 4

## 2022-05-15 MED ORDER — METOPROLOL TARTRATE 25 MG PO TABS
25.0000 mg | ORAL_TABLET | Freq: Two times a day (BID) | ORAL | Status: DC
  Administered 2022-05-15 – 2022-05-17 (×4): 25 mg via ORAL
  Filled 2022-05-15 (×4): qty 1

## 2022-05-15 MED ORDER — SENNA 8.6 MG PO TABS
4.0000 | ORAL_TABLET | Freq: Two times a day (BID) | ORAL | Status: DC
  Administered 2022-05-15 – 2022-05-17 (×3): 34.4 mg via ORAL
  Filled 2022-05-15 (×3): qty 4

## 2022-05-15 MED ORDER — MAGNESIUM SULFATE 2 GM/50ML IV SOLN
2.0000 g | Freq: Once | INTRAVENOUS | Status: AC
  Administered 2022-05-15: 2 g via INTRAVENOUS
  Filled 2022-05-15: qty 50

## 2022-05-15 MED ORDER — FUROSEMIDE 20 MG PO TABS
20.0000 mg | ORAL_TABLET | ORAL | Status: DC
  Administered 2022-05-15 – 2022-05-16 (×2): 20 mg via ORAL
  Filled 2022-05-15 (×2): qty 1

## 2022-05-15 MED ORDER — DILTIAZEM HCL 25 MG/5ML IV SOLN
10.0000 mg | Freq: Once | INTRAVENOUS | Status: AC
  Administered 2022-05-15: 10 mg via INTRAVENOUS
  Filled 2022-05-15: qty 5

## 2022-05-15 MED ORDER — POTASSIUM CHLORIDE CRYS ER 20 MEQ PO TBCR: 40.0000 meq | EXTENDED_RELEASE_TABLET | Freq: Once | ORAL | Status: DC

## 2022-05-15 MED ORDER — ATORVASTATIN CALCIUM 80 MG PO TABS
80.0000 mg | ORAL_TABLET | Freq: Every day | ORAL | Status: DC
  Administered 2022-05-15 – 2022-05-17 (×3): 80 mg via ORAL
  Filled 2022-05-15: qty 2
  Filled 2022-05-15: qty 1
  Filled 2022-05-15: qty 2

## 2022-05-15 MED ORDER — POTASSIUM CHLORIDE 10 MEQ/100ML IV SOLN: 10.0000 meq | INTRAVENOUS | Status: AC

## 2022-05-15 MED ORDER — ADENOSINE 6 MG/2ML IV SOLN
12.0000 mg | Freq: Once | INTRAVENOUS | Status: AC
  Administered 2022-05-15: 12 mg via INTRAVENOUS

## 2022-05-15 MED ORDER — ENOXAPARIN SODIUM 40 MG/0.4ML IJ SOSY
40.0000 mg | PREFILLED_SYRINGE | INTRAMUSCULAR | Status: DC
  Administered 2022-05-15: 40 mg via SUBCUTANEOUS
  Filled 2022-05-15: qty 0.4

## 2022-05-15 MED ORDER — FLUTICASONE FUROATE-VILANTEROL 200-25 MCG/ACT IN AEPB
1.0000 | INHALATION_SPRAY | Freq: Every day | RESPIRATORY_TRACT | Status: DC
  Filled 2022-05-15 (×2): qty 28

## 2022-05-15 MED ORDER — ADENOSINE 6 MG/2ML IV SOLN: 12.0000 mg | Freq: Once | INTRAVENOUS | Status: DC

## 2022-05-15 MED ORDER — FUROSEMIDE 40 MG PO TABS
40.0000 mg | ORAL_TABLET | Freq: Every day | ORAL | Status: DC
  Administered 2022-05-16 – 2022-05-17 (×2): 40 mg via ORAL
  Filled 2022-05-15: qty 2
  Filled 2022-05-15: qty 1

## 2022-05-15 MED ORDER — ADENOSINE 6 MG/2ML IV SOLN
6.0000 mg | Freq: Once | INTRAVENOUS | Status: AC
  Administered 2022-05-15: 6 mg via INTRAVENOUS
  Filled 2022-05-15: qty 2

## 2022-05-15 MED ORDER — FAMOTIDINE IN NACL 20-0.9 MG/50ML-% IV SOLN
20.0000 mg | Freq: Once | INTRAVENOUS | Status: AC
  Administered 2022-05-15: 20 mg via INTRAVENOUS
  Filled 2022-05-15: qty 50

## 2022-05-15 MED ORDER — TRAZODONE HCL 100 MG PO TABS
300.0000 mg | ORAL_TABLET | Freq: Every day | ORAL | Status: DC
  Administered 2022-05-15 – 2022-05-16 (×2): 300 mg via ORAL
  Filled 2022-05-15: qty 3
  Filled 2022-05-15: qty 6

## 2022-05-15 MED ORDER — FERROUS SULFATE 325 (65 FE) MG PO TABS
325.0000 mg | ORAL_TABLET | Freq: Every day | ORAL | Status: DC
  Administered 2022-05-15 – 2022-05-17 (×3): 325 mg via ORAL
  Filled 2022-05-15 (×3): qty 1

## 2022-05-15 MED ORDER — FENTANYL CITRATE PF 50 MCG/ML IJ SOSY
50.0000 ug | PREFILLED_SYRINGE | Freq: Once | INTRAMUSCULAR | Status: AC
  Administered 2022-05-15: 50 ug via INTRAVENOUS
  Filled 2022-05-15: qty 1

## 2022-05-15 MED ORDER — SODIUM CHLORIDE 0.9 % IV SOLN: INTRAVENOUS | Status: DC | PRN

## 2022-05-15 MED ORDER — DILTIAZEM HCL-DEXTROSE 125-5 MG/125ML-% IV SOLN (PREMIX)
5.0000 mg/h | INTRAVENOUS | Status: DC
  Administered 2022-05-15: 5 mg/h via INTRAVENOUS
  Filled 2022-05-15: qty 125

## 2022-05-15 MED ORDER — DUTASTERIDE 0.5 MG PO CAPS
0.5000 mg | ORAL_CAPSULE | Freq: Every day | ORAL | Status: DC
  Administered 2022-05-15 – 2022-05-16 (×2): 0.5 mg via ORAL
  Filled 2022-05-15 (×4): qty 1

## 2022-05-15 MED ORDER — POLYETHYLENE GLYCOL 3350 17 G PO PACK: 17.0000 g | PACK | Freq: Every day | ORAL | Status: DC | PRN

## 2022-05-15 MED ORDER — CLOPIDOGREL BISULFATE 75 MG PO TABS
75.0000 mg | ORAL_TABLET | Freq: Once | ORAL | Status: AC
  Administered 2022-05-15: 75 mg via ORAL
  Filled 2022-05-15: qty 1

## 2022-05-15 MED ORDER — ASPIRIN 81 MG PO TBEC
81.0000 mg | DELAYED_RELEASE_TABLET | Freq: Two times a day (BID) | ORAL | Status: DC
  Administered 2022-05-15 – 2022-05-17 (×4): 81 mg via ORAL
  Filled 2022-05-15 (×4): qty 1

## 2022-05-15 MED ORDER — HYDROMORPHONE HCL 1 MG/ML IJ SOLN: 1.0000 mg | Freq: Once | INTRAMUSCULAR | Status: DC

## 2022-05-15 MED ORDER — BUDESON-GLYCOPYRROL-FORMOTEROL 160-9-4.8 MCG/ACT IN AERO: 2.0000 | INHALATION_SPRAY | Freq: Every day | RESPIRATORY_TRACT | Status: DC
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
## 2022-05-15 NOTE — Transfer of Care (Signed)
Immediate Anesthesia Transfer of Care Note  Patient: Jerry [name]  Procedure(s) Performed: CT WITH ANESTHESIA  Patient Location:  ED  Anesthesia Type:MAC  Level of Consciousness: awake, alert , oriented, and patient cooperative  Airway & Oxygen Therapy: Patient Spontanous Breathing and Patient connected to nasal cannula oxygen  Post-op Assessment: Report given to RN and Post -op Vital signs reviewed and stable  Post vital signs: Reviewed and stable  Last Vitals:  Vitals Value Taken Time  BP 95/52 (67)   Temp    Pulse 99 05/15/22 1357  Resp 22   SpO2 93% 05/15/22 1357  Vitals shown include unvalidated device data.  Last Pain:  Vitals:   05/15/22 1125  PainSc: 5          Complications: No notable events documented.

## 2022-05-15 NOTE — Anesthesia Procedure Notes (Signed)
Procedure Name: MAC Date/Time: 05/15/2022 1:35 PM  Performed by: Betha Loa, CRNAPre-anesthesia Checklist: Patient identified, Emergency Drugs available, Suction available and Patient being monitored Patient Re-evaluated:Patient Re-evaluated prior to induction Oxygen Delivery Method: Simple face mask Preoxygenation: Pre-oxygenation with 100% oxygen Induction Type: IV induction Dental Injury: Teeth and Oropharynx as per pre-operative assessment

## 2022-05-15 NOTE — Anesthesia Preprocedure Evaluation (Addendum)
Anesthesia Evaluation  Patient identified by MRN, date of birth, ID band Patient awake    Reviewed: Allergy & Precautions, H&P , NPO status , Patient's Chart, lab work & pertinent test results  Airway Mallampati: I  TM Distance: >3 FB Neck ROM: Full    Dental no notable dental hx. (+) Edentulous Upper, Edentulous Lower, Dental Advisory Given   Pulmonary shortness of breath, COPD,  COPD inhaler and oxygen dependent, former smoker,    Pulmonary exam normal breath sounds clear to auscultation       Cardiovascular hypertension, Pt. on medications + CAD, + Cardiac Stents, + Peripheral Vascular Disease, +CHF and + DOE   Rhythm:Regular     Neuro/Psych  Headaches, Anxiety Depression    GI/Hepatic Neg liver ROS, hiatal hernia, GERD  ,  Endo/Other  negative endocrine ROS  Renal/GU negative Renal ROS  negative genitourinary   Musculoskeletal   Abdominal   Peds  Hematology  (+) Blood dyscrasia, anemia ,   Anesthesia Other Findings   Reproductive/Obstetrics negative OB ROS                            Anesthesia Physical Anesthesia Plan  ASA: 3 and emergent  Anesthesia Plan: MAC   Post-op Pain Management:    Induction: Intravenous  PONV Risk Score and Plan: 1 and Ondansetron and Midazolam  Airway Management Planned: Natural Airway and Simple Face Mask  Additional Equipment:   Intra-op Plan:   Post-operative Plan:   Informed Consent: I have reviewed the patients History and Physical, chart, labs and discussed the procedure including the risks, benefits and alternatives for the proposed anesthesia with the patient or authorized representative who has indicated his/her understanding and acceptance.     Dental advisory given  Plan Discussed with: CRNA  Anesthesia Plan Comments:         Anesthesia Quick Evaluation

## 2022-05-15 NOTE — ED Triage Notes (Signed)
Pt bib ems from home with sudden onset central chest pain. Pt found to have HR 150. Given '6mg'$  adenosine with no change. Pt also given '324mg'$  aspirin. Pt took 1 nitro without improvement. BP with ems 250/100.

## 2022-05-15 NOTE — Hospital Course (Addendum)
Jerry [name] is a 71 y.o. male with past medical history of CAD s/p LAD stent 0938, chronic diastolic HF due to hypertropic obstructive cardiomyopathy, COPD GOLD II, essential hypertension, chronic pain syndrome, and normocytic anemia who presents with chest pain and tachycardia concerning for acute coronary syndrome who was admitted for further cardiopulmonary work-up and monitoring. Below is his hospital course by problem.  Chest Pain Tachycardia Patient presented with a HR in the 140s-160s with chest pain described as sharp and squeezing with radiation since this morning concerning for acute coronary syndrome. There was concern for Aflutter 2:1 as initial EKG showed rate of 150 with concerns for aflutter 2:1. In the ED patient was given bolus of diltiazem followed by drip. Cardiology was also consulted and patient was given IV adenosine 12 mg. Repeat EKG without evidence of flutter waves and was consistent with sinus tachycardia. Patient's description of chest pain is concerning for potential ACS, however, EKG with no evidence of acute ischemic changes, and there was less concern of the troponin rise in the setting of tachycardia/distress. There was D-dimer elevation on presentation, CTA was negative for pulmonary embolism, aortic dissection, or aneurysm. Patient's symptoms seemed to be consistent with NSTEMI vs. unstable angina and there was increased concern given his significant cardiac history. Patient started on IV heparin on 10/11 given suspicion for ACS which was discontinued prior to discharge. Echocardiogram on 10/11 showed LVEF 50%, mildly decreased function, LV with grade 1 diastolic dysfunction. His chest pain was thought to be multifactorial suspect related to costochondritis and increased work of breathing in the setting of missing his inhaler doses as well as intermittent demand ischemia with his SOB. He was given voltaren gel prior to discharge.   CAD s/p LAD stent 2019 He has long  history of CAD, remote stenting to OM1 in 1997 and underwent cardiac cath 07/2018 that showed instent restonosis of OM1 and mid LAD disease. DES was placed in LAD at that time. Patient continued on home aspirin 81 mg, clopidogrel 75 mg, metoprolol tartrate 25 mg daily during hospitalization   Chronic diastolic HF Last echo 1/82 EF 55-60%. Patient euvolemic during hospitalization. He was continued on his home Lasix 40 mg oral daily and home Lasix 20 mg oral after supper   COPD GOLD II He was started on scheduled incruse ellipta and breo ellipta daily and supplemental oxygen as needed with goal of 88-92%. He was discharged with additional doses of his inhalers.    Chronic conditions:  HTN: Stable during hospitalization HLD: continued on home lipitor  Normocytic anemia: continued on home ferrous sulfate  Lumbar postlaminectomy syndrome: on pain pump, dilaudid PRN GERD: continued home pantoprazole  Anxiety: continued on home trazodone, nortriptyline BPH: continued on home dutasteride, tamsulosin Constipation: continued on home senna

## 2022-05-15 NOTE — ED Notes (Signed)
Notified provider of pt's critical troponin 587 and critical lactic acid 2.0

## 2022-05-15 NOTE — ED Notes (Signed)
Pt placed on [place] and O2@ 4L via . MD, PA, and pharmacy at bedside.
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
## 2022-05-15 NOTE — ED Provider Notes (Signed)
[facility] EMERGENCY DEPARTMENT Provider Note   CSN: [id number] Arrival date & time: 05/15/22  0848     History  Chief Complaint  Patient presents with   Chest Pain    Jerry [name] is a 71 y.o. male.  Patient with history of COPD, CHF (last echo 2/22 EF 55%-60%), CAD with LAD stent placed in 2019, and chronic respiratory failure on 2.5L [place] at baseline presents today with complaints of chest pain. States that he woke up this morning in normal health and went to the bathroom around 7 am where he developed sudden onset central chest pain which radiated down his left arm. Endorses associated worsening shortness of breath from baseline and presents on 4L O2 [place]. States he called EMS who found his heartrate to be in the 160s. Was given 6 mg adenosine for same with no change. Also given 1 SL nitro and 324 ASA with minimal improvement in pain. Patient denies any history of similar events previously. States that he is on ASA and Plavix daily, but no anticoagulation.   The history is provided by the patient. No language interpreter was used.  Chest Pain Associated symptoms: palpitations        Home Medications Prior to Admission medications   Medication Sig Start Date End Date Taking? Authorizing Provider  acetaminophen (TYLENOL) 650 MG CR tablet Take 650 mg by mouth every 8 (eight) hours as needed for pain.     [provider]  albuterol (PROAIR HFA) 108 (90 Base) MCG/ACT inhaler 2 puffs every 4 hours as needed only  if your can't catch your breath Patient taking differently: Inhale 2 puffs into the lungs every 4 (four) hours as needed for wheezing or shortness of breath. 01/28/20   Tanda Rockers, MD  aspirin EC 81 MG tablet Take 1 tablet (81 mg total) by mouth 2 (two) times daily. 08/18/21   Gary Fleet, PA-C  atorvastatin (LIPITOR) 80 MG tablet TAKE 1 TABLET BY MOUTH EVERY DAY AT 6PM 04/30/22   Lorretta Harp, MD  bisacodyl (DULCOLAX) 10 MG suppository  Place 10 mg rectally daily as needed for moderate constipation.     [provider]  Budeson-Glycopyrrol-Formoterol (BREZTRI AEROSPHERE) 160-9-4.8 MCG/ACT AERO Inhale 2 puffs into the lungs in the morning and at bedtime. 07/13/21   Tanda Rockers, MD  calcium carbonate (TUMS - DOSED IN MG ELEMENTAL CALCIUM) 500 MG chewable tablet Chew 1-2 tablets by mouth 3 (three) times daily as needed for indigestion or heartburn.     [provider]  cholecalciferol (VITAMIN D) 1000 units tablet Take 1,000 Units by mouth daily.    [provider]  clopidogrel (PLAVIX) 75 MG tablet TAKE 1 TABLET BY MOUTH DAILY. PLEASE SCHEDULE AN OFFICE VISIT WITH DR. Gwenlyn Found 04/30/22   Lorretta Harp, MD  dutasteride (AVODART) 0.5 MG capsule Take 0.5 mg by mouth at bedtime.    [provider]  fentaNYL (DURAGESIC) 50 MCG/HR Place 1 patch onto the skin every 3 (three) days. 08/20/21 08/20/22  Gary Fleet, PA-C  ferrous sulfate 325 (65 FE) MG tablet Take 325 mg by mouth daily.    [provider]  furosemide (LASIX) 20 MG tablet Take 2 tablets (40 mg total) by mouth daily. Patient taking differently: Take 20-40 mg by mouth See admin instructions. 40 mg in the morning, 20 mg in the evening 10/02/20   Pokhrel, Laxman, MD  HYDROmorphone (DILAUDID) 8 MG tablet Take 1 tablet (8 mg total) by mouth  every 6 (six) hours as needed for severe pain. 08/18/21   Gary Fleet, PA-C  losartan (COZAAR) 25 MG tablet TAKE 1 TABLET BY MOUTH EVERY DAY Patient taking differently: Take 25 mg by mouth daily. 09/12/18   Lorretta Harp, MD  metoprolol tartrate (LOPRESSOR) 25 MG tablet TAKE 1 TABLET BY MOUTH 2 TIMES DAILY. SCHEDULE AN APPOINTMENT FOR FURTHER REFILLS, FINAL ATTEMPT 04/02/22   Lorretta Harp, MD  Multiple Vitamin (MULTIVITAMIN WITH MINERALS) TABS tablet Take 1 tablet by mouth daily. FOR SENIORS    [provider]  nitroGLYCERIN (NITROSTAT) 0.4 MG SL tablet DISSOLVE 1 TABLET UNDER TONGUE  EVERY 5 MINS AS NEEDED FOR CHEST PAIN 01/23/21   Lorretta Harp, MD  nortriptyline (PAMELOR) 10 MG capsule Take 40 mg by mouth at bedtime.    [provider]  ondansetron (ZOFRAN) 4 MG tablet Take 4 mg by mouth every 8 (eight) hours as needed for nausea or vomiting.    [provider]  PAIN MANAGEMENT INTRATHECAL, IT, PUMP 1 each by Intrathecal route. Intrathecal (IT) medication:  Morphine    [provider]  pantoprazole (PROTONIX) 40 MG tablet Take 1 tablet (40 mg total) by mouth 2 (two) times daily. 06/10/15   Angiulli, Lavon Paganini, PA-C  polyethylene glycol (MIRALAX / GLYCOLAX) packet Take 17 g by mouth daily as needed for moderate constipation.    [provider]  potassium chloride (K-DUR,KLOR-CON) 10 MEQ tablet Take 1 tablet (10 mEq total) by mouth every other day. 07/20/18   Debbe Odea, MD  senna (SENOKOT) 8.6 MG TABS tablet Take 4 tablets by mouth 2 (two) times daily.    [provider]  sertraline (ZOLOFT) 100 MG tablet Take 100 mg by mouth daily at 6 PM. 07/25/20   [provider]  SUMAtriptan (IMITREX) 100 MG tablet Take 100 mg by mouth every 2 (two) hours as needed for migraine. May repeat in 2 hours if headache persists or recurs.    [provider]  tamsulosin (FLOMAX) 0.4 MG CAPS capsule Take 0.8 mg by mouth daily. 08/24/20   [provider]  Tetrahydrozoline HCl (VISINE OP) Place 1 drop into both eyes 4 (four) times daily as needed (dry eyes).     [provider]  tiZANidine (ZANAFLEX) 4 MG tablet Take 4 mg by mouth every 6 (six) hours as needed for muscle spasms.    [provider]  traZODone (DESYREL) 150 MG tablet Take 300 mg by mouth at bedtime.    [provider]  vitamin C (ASCORBIC ACID) 500 MG tablet Take 500 mg by mouth daily.    [provider]      Allergies    Ciprofloxacin, Penicillins, Penicillin g, and Tramadol    Review of Systems   Review of Systems   Cardiovascular:  Positive for chest pain and palpitations.  All other systems reviewed and are negative.   Physical Exam Updated Vital Signs BP 130/68   Pulse (!) 149   Temp 98.8 F (37.1 C)   Resp (!) 26   SpO2 100%  Physical Exam Vitals and nursing note reviewed.  Constitutional:      General: He is not in acute distress.    Appearance: Normal appearance. He is normal weight. He is diaphoretic. He is not ill-appearing or toxic-appearing.  HENT:     Head: Normocephalic and atraumatic.  Cardiovascular:     Rate and Rhythm: Tachycardia present.     Pulses:  Radial pulses are 2+ on the right side and 2+ on the left side.       Dorsalis pedis pulses are 2+ on the right side and 2+ on the left side.       Posterior tibial pulses are 2+ on the right side and 2+ on the left side.     Heart sounds: Normal heart sounds.  Pulmonary:     Effort: Pulmonary effort is normal. No respiratory distress.     Breath sounds: Normal breath sounds.     Comments: On baseline 2L [place] Abdominal:     Palpations: Abdomen is soft.  Musculoskeletal:        General: Normal range of motion.     Cervical back: Normal range of motion.     Right lower leg: No tenderness.     Left lower leg: No tenderness.     Comments: Trace pitting edema in bilateral lower extremities  Skin:    General: Skin is warm.  Neurological:     General: No focal deficit present.     Mental Status: He is alert.  Psychiatric:        Mood and Affect: Mood normal.        Behavior: Behavior normal.     ED Results / Procedures / Treatments   Labs (all labs ordered are listed, but only abnormal results are displayed) Labs Reviewed  CBC WITH DIFFERENTIAL/PLATELET - Abnormal; Notable for the following components:      Result Value   RBC 3.81 (*)    Hemoglobin 9.9 (*)    HCT 32.3 (*)    Lymphs Abs 0.5 (*)    All other components within normal limits  COMPREHENSIVE METABOLIC PANEL  BRAIN NATRIURETIC PEPTIDE   MAGNESIUM  TROPONIN I (HIGH SENSITIVITY)    EKG None  Radiology DG Chest Portable 1 View  Result Date: 05/15/2022 CLINICAL DATA:  Chest pain. EXAM: PORTABLE CHEST 1 VIEW COMPARISON:  Chest x-ray 07/13/2021. FINDINGS: Similar chronic pleuroparenchymal scarring on the right. No new consolidation. No visible pleural effusions or pneumothorax. Cardiomediastinal silhouette is enlarged and accentuated by technique. Polyarticular degenerative change. IMPRESSION: No evidence of acute cardiopulmonary disease. Electronically Signed   By: Margaretha Sheffield M.D.   On: 05/15/2022 09:08    Procedures .Critical Care  Performed by: Bud Face, PA-C Authorized by: Bud Face, PA-C   Critical care provider statement:    Critical care time (minutes):  75   Critical care start time:  05/15/2022 11:00 AM   Critical care end time:  05/15/2022 12:15 PM   Critical care was necessary to treat or prevent imminent or life-threatening deterioration of the following conditions:  Cardiac failure, circulatory failure and respiratory failure   Critical care was time spent personally by me on the following activities:  Development of treatment plan with patient or surrogate, discussions with consultants, discussions with primary provider, evaluation of patient's response to treatment, obtaining history from patient or surrogate, ordering and performing treatments and interventions, ordering and review of laboratory studies, ordering and review of radiographic studies, pulse oximetry, re-evaluation of patient's condition and review of old charts   Care discussed with: admitting provider       Medications Ordered in ED Medications  potassium chloride 10 mEq in 100 mL IVPB (10 mEq Intravenous Not Given 05/15/22 1309)  famotidine (PEPCID) IVPB 20 mg premix (has no administration in time range)  diltiazem (CARDIZEM) injection 10 mg (10 mg Intravenous Given 05/15/22 0901)  ondansetron (ZOFRAN) injection  4 mg (4  mg Intravenous Given 05/15/22 0944)  fentaNYL (SUBLIMAZE) injection 50 mcg (50 mcg Intravenous Given 05/15/22 1049)  adenosine (ADENOCARD) 6 MG/2ML injection 6 mg (6 mg Intravenous Given 05/15/22 1112)  adenosine (ADENOCARD) 6 MG/2ML injection 12 mg (12 mg Intravenous Given 05/15/22 1117)  ondansetron (ZOFRAN) injection 4 mg (4 mg Intravenous Given 05/15/22 1311)  iohexol (OMNIPAQUE) 350 MG/ML injection 100 mL (100 mLs Intravenous Contrast Given 05/15/22 1357)    ED Course/ Medical Decision Making/ A&P                           Medical Decision Making Amount and/or Complexity of Data Reviewed Labs: ordered. Radiology: ordered.  Risk Prescription drug management.   This patient presents to the ED for concern of chest pain, tachycardia, this involves an extensive number of treatment options, and is a complaint that carries with it a high risk of complications and morbidity.  The differential diagnosis includes ACS, SVT, afib RVR, PE, dissection. This is not an exhaustive differential   Co morbidities that complicate the patient evaluation  Hx COPD, CHF, CAD, hypertension, diabetes   Additional history obtained:  Additional history obtained from epic chart review   Lab Tests:  I Ordered, and personally interpreted labs.  The pertinent results include:  Hgb 9.9 consistent with baseline. No leukocytosis.Troponin 13. Na 134, K 3.1. Mag 1.4. D dimer 0.81.   Imaging Studies ordered:  I ordered imaging studies including CXR, CTA dissection  I independently visualized and interpreted imaging which showed  CXR: NAD CTA: no acute findings I agree with the radiologist interpretation   Cardiac Monitoring: / EKG:  The patient was maintained on a cardiac monitor.  I personally viewed and interpreted the cardiac monitored which showed an underlying rhythm of:  Upon initial presentation at 9:36 am, EKG shows rate 150, concerning for aflutter 2:1. Patient given bolus of diltizem  followed by drip of same. After diltizem, repeat EKG obtained which shows sinus tachycardia.   Consultations Obtained:  I requested consultation with the cardiology,  and discussed lab and imaging findings as well as pertinent plan - they recommend: they will see the patient   Problem List / ED Course / Critical interventions / Medication management   I ordered medication including fentanyl for pain, zofran for nausea, diltizem and adenosine Reevaluation of the patient after these medicines showed that the patient improved I have reviewed the patients home medicines and have made adjustments as needed   Test / Admission - Considered:  Patient presents today with sudden onset chest pain at 7 am this morning with nausea and diaphoresis. Found to be tachycardia in the 150s by EMS and given adenosine for same. Unfortunately unclear the underlying rhythm following administration of this, EMS did not leave a rhythm strip. Initially thought to be in new onset afib, given diltizem and Lovenox for same. Initial plan was if no conversion with diltizem, to cardiovert cardiovert despite not being anticoagulated given less than 48 hours of symptoms. However, after diltizem administration, patient was still notably tachycardic, however EKG now appeared to show sinus tach. Cardiology consulted for same and plan to give adenosine to determine underlying rate.  11:15 AM: Patient given 6 mg adenosine with cardiology present at the bedside with no change in rate. 12 mg given which showed no flutter waves present confirmed by cardiology.  Given patients tachycardia without clear cause with chest pain as well as now back pain that  patient states is different from baseline, plan to obtain CTA dissection.  Unfortunately, given patients chronic back pain for which he has a morphine stimulator in place, he is unable to lie flat for CT. We did given Fentanyl and Dilaudid and took him to CT to attempt scan, however  patient was unable to tolerate lying flat due to back pain. Patient taken back to room. Upon chart review, it appears every time patient has required CT imaging, anesthesia has had to perform sedation in order for him to tolerate the scan. Consulted anesthesia for same who will see the patient and perform sedation to obtain CT imaging. Patient still in pain with rates in the 120s, did attempt to reduce his oxygen to his baseline 2.5, however he dropped to 89% on this and was subsequently increased back to 4L with improvement. He continues to endorse chest pain, shortness of breath, nausea, and worsening back pain. 1:25 PM patient successfully sedated with precedex, propofol, and fentanyl for CT imaging with anesthesia and in the scanner currently CTA resulted and is unremarkable for acute findings. Discussed findings with patient. Given his persistent chest pain with his associated comorbid factors (HEART score 5) and persistent tachycardia requiring increase in his oxygen requirement from baseline, patient will need admission. Patient is understanding and amenable with plan.  Discussed patient with hospitalist who agrees to admit.   This is a shared visit with supervising physician Dr. Matilde Sprang who has independently evaluated patient & provided guidance in evaluation/management/disposition, in agreement with care    Final Clinical Impression(s) / ED Diagnoses Final diagnoses:  Chest pain, unspecified type  Tachycardia  Hypoxia    Rx / DC Orders ED Discharge Orders     None         Nestor Lewandowsky 05/15/22 1510    Kommor, Debe Coder, MD 05/15/22 [phone]

## 2022-05-15 NOTE — Anesthesia Postprocedure Evaluation (Signed)
Anesthesia Post Note  Patient: Jerry [name]  Procedure(s) Performed: CT WITH ANESTHESIA     Patient location during evaluation: Other (ED) Anesthesia Type: MAC Level of consciousness: awake and alert Pain management: pain level controlled Vital Signs Assessment: post-procedure vital signs reviewed and stable Respiratory status: spontaneous breathing, nonlabored ventilation, respiratory function stable and patient connected to nasal cannula oxygen Cardiovascular status: stable and blood pressure returned to baseline Postop Assessment: no apparent nausea or vomiting Anesthetic complications: no   No notable events documented.  Last Vitals:  Vitals:   05/15/22 1415 05/15/22 1430  BP: (!) 96/53 (!) 96/51  Pulse: 96 93  Resp: 15 14  Temp:    SpO2: 94% 92%    Last Pain:  Vitals:   05/15/22 1125  PainSc: 5                  Latajah Thuman,W. EDMOND

## 2022-05-16 ENCOUNTER — Other Ambulatory Visit: Payer: Self-pay

## 2022-05-16 ENCOUNTER — Observation Stay (HOSPITAL_COMMUNITY): Payer: Medicare Other

## 2022-05-16 ENCOUNTER — Other Ambulatory Visit (HOSPITAL_COMMUNITY): Payer: Self-pay

## 2022-05-16 ENCOUNTER — Telehealth (HOSPITAL_COMMUNITY): Payer: Self-pay | Admitting: Pharmacy Technician

## 2022-05-16 ENCOUNTER — Encounter (HOSPITAL_COMMUNITY): Payer: Self-pay | Admitting: Radiology

## 2022-05-16 DIAGNOSIS — I2510 Atherosclerotic heart disease of native coronary artery without angina pectoris: Secondary | ICD-10-CM | POA: Diagnosis not present

## 2022-05-16 DIAGNOSIS — G894 Chronic pain syndrome: Secondary | ICD-10-CM

## 2022-05-16 DIAGNOSIS — Z7982 Long term (current) use of aspirin: Secondary | ICD-10-CM | POA: Diagnosis not present

## 2022-05-16 DIAGNOSIS — E876 Hypokalemia: Secondary | ICD-10-CM | POA: Diagnosis present

## 2022-05-16 DIAGNOSIS — E785 Hyperlipidemia, unspecified: Secondary | ICD-10-CM | POA: Diagnosis not present

## 2022-05-16 DIAGNOSIS — R9431 Abnormal electrocardiogram [ECG] [EKG]: Secondary | ICD-10-CM | POA: Diagnosis not present

## 2022-05-16 DIAGNOSIS — J449 Chronic obstructive pulmonary disease, unspecified: Secondary | ICD-10-CM | POA: Diagnosis not present

## 2022-05-16 DIAGNOSIS — N400 Enlarged prostate without lower urinary tract symptoms: Secondary | ICD-10-CM | POA: Diagnosis not present

## 2022-05-16 DIAGNOSIS — R000 Tachycardia, unspecified: Secondary | ICD-10-CM | POA: Diagnosis not present

## 2022-05-16 DIAGNOSIS — I214 Non-ST elevation (NSTEMI) myocardial infarction: Secondary | ICD-10-CM | POA: Diagnosis not present

## 2022-05-16 DIAGNOSIS — I421 Obstructive hypertrophic cardiomyopathy: Secondary | ICD-10-CM | POA: Diagnosis not present

## 2022-05-16 DIAGNOSIS — Z825 Family history of asthma and other chronic lower respiratory diseases: Secondary | ICD-10-CM | POA: Diagnosis not present

## 2022-05-16 DIAGNOSIS — I25119 Atherosclerotic heart disease of native coronary artery with unspecified angina pectoris: Secondary | ICD-10-CM

## 2022-05-16 DIAGNOSIS — Z8249 Family history of ischemic heart disease and other diseases of the circulatory system: Secondary | ICD-10-CM | POA: Diagnosis not present

## 2022-05-16 DIAGNOSIS — Z7902 Long term (current) use of antithrombotics/antiplatelets: Secondary | ICD-10-CM | POA: Diagnosis not present

## 2022-05-16 DIAGNOSIS — R079 Chest pain, unspecified: Secondary | ICD-10-CM | POA: Diagnosis not present

## 2022-05-16 DIAGNOSIS — Z79899 Other long term (current) drug therapy: Secondary | ICD-10-CM | POA: Diagnosis not present

## 2022-05-16 DIAGNOSIS — I5032 Chronic diastolic (congestive) heart failure: Secondary | ICD-10-CM | POA: Diagnosis not present

## 2022-05-16 DIAGNOSIS — D649 Anemia, unspecified: Secondary | ICD-10-CM | POA: Diagnosis not present

## 2022-05-16 DIAGNOSIS — J9611 Chronic respiratory failure with hypoxia: Secondary | ICD-10-CM | POA: Diagnosis not present

## 2022-05-16 DIAGNOSIS — F32A Depression, unspecified: Secondary | ICD-10-CM | POA: Diagnosis not present

## 2022-05-16 DIAGNOSIS — I110 Hypertensive heart disease with heart failure: Secondary | ICD-10-CM | POA: Diagnosis not present

## 2022-05-16 DIAGNOSIS — Z96653 Presence of artificial knee joint, bilateral: Secondary | ICD-10-CM | POA: Diagnosis not present

## 2022-05-16 DIAGNOSIS — Z87891 Personal history of nicotine dependence: Secondary | ICD-10-CM | POA: Diagnosis not present

## 2022-05-16 DIAGNOSIS — I739 Peripheral vascular disease, unspecified: Secondary | ICD-10-CM | POA: Diagnosis not present

## 2022-05-16 DIAGNOSIS — M961 Postlaminectomy syndrome, not elsewhere classified: Secondary | ICD-10-CM | POA: Diagnosis not present

## 2022-05-16 LAB — CBC
HCT: 35 % — ABNORMAL LOW (ref 39.0–52.0)
Hemoglobin: 10.6 g/dL — ABNORMAL LOW (ref 13.0–17.0)
MCH: 25.8 pg — ABNORMAL LOW (ref 26.0–34.0)
MCHC: 30.3 g/dL (ref 30.0–36.0)
MCV: 85.2 fL (ref 80.0–100.0)
Platelets: 188 10*3/uL (ref 150–400)
RBC: 4.11 MIL/uL — ABNORMAL LOW (ref 4.22–5.81)
RDW: 14.8 % (ref 11.5–15.5)
WBC: 11.3 10*3/uL — ABNORMAL HIGH (ref 4.0–10.5)
nRBC: 0 % (ref 0.0–0.2)

## 2022-05-16 LAB — CBG MONITORING, ED: Glucose-Capillary: 92 mg/dL (ref 70–99)

## 2022-05-16 LAB — ECHOCARDIOGRAM COMPLETE
AR max vel: 3.01 cm2
AV Area VTI: 3.17 cm2
AV Area mean vel: 3 cm2
AV Mean grad: 8 mmHg
AV Peak grad: 12.1 mmHg
Ao pk vel: 1.74 m/s
Area-P 1/2: 4.12 cm2
Height: 74 in
S' Lateral: 4.1 cm
Weight: 4272 oz

## 2022-05-16 LAB — BASIC METABOLIC PANEL
Anion gap: 9 (ref 5–15)
BUN: 9 mg/dL (ref 8–23)
CO2: 30 mmol/L (ref 22–32)
Calcium: 9.5 mg/dL (ref 8.9–10.3)
Chloride: 101 mmol/L (ref 98–111)
Creatinine, Ser: 1 mg/dL (ref 0.61–1.24)
GFR, Estimated: >60 mL/min (ref >60)
Glucose, Bld: 102 mg/dL — ABNORMAL HIGH (ref 70–99)
Potassium: 4.4 mmol/L (ref 3.5–5.1)
Sodium: 140 mmol/L (ref 135–145)

## 2022-05-16 LAB — LACTIC ACID, PLASMA: Lactic Acid, Venous: 1.9 mmol/L (ref 0.5–1.9)

## 2022-05-16 LAB — TROPONIN I (HIGH SENSITIVITY)
Troponin I (High Sensitivity): 737 ng/L (ref <18)
Troponin I (High Sensitivity): 662 ng/L (ref <18)

## 2022-05-16 LAB — HEPARIN LEVEL (UNFRACTIONATED): Heparin Unfractionated: 0.17 IU/mL — ABNORMAL LOW (ref 0.30–0.70)

## 2022-05-16 LAB — MAGNESIUM: Magnesium: 2.4 mg/dL (ref 1.7–2.4)

## 2022-05-16 MED ORDER — HEPARIN (PORCINE) 25000 UT/250ML-% IV SOLN
1700.0000 [IU]/h | INTRAVENOUS | Status: AC
  Administered 2022-05-16: 1400 [IU]/h via INTRAVENOUS
  Administered 2022-05-17: 1700 [IU]/h via INTRAVENOUS
  Filled 2022-05-16 (×2): qty 250

## 2022-05-16 MED ORDER — CLOPIDOGREL BISULFATE 75 MG PO TABS
75.0000 mg | ORAL_TABLET | Freq: Every day | ORAL | Status: DC
  Administered 2022-05-16 – 2022-05-17 (×2): 75 mg via ORAL
  Filled 2022-05-16 (×2): qty 1

## 2022-05-16 MED ORDER — TIZANIDINE HCL 4 MG PO TABS
4.0000 mg | ORAL_TABLET | Freq: Four times a day (QID) | ORAL | Status: DC | PRN
  Administered 2022-05-16 – 2022-05-17 (×3): 4 mg via ORAL
  Filled 2022-05-16 (×3): qty 1

## 2022-05-16 MED ORDER — HEPARIN BOLUS VIA INFUSION
4000.0000 [IU] | Freq: Once | INTRAVENOUS | Status: AC
  Administered 2022-05-16: 4000 [IU] via INTRAVENOUS

## 2022-05-16 MED ORDER — TIZANIDINE HCL 4 MG PO TABS
4.0000 mg | ORAL_TABLET | Freq: Three times a day (TID) | ORAL | Status: DC | PRN
  Administered 2022-05-16: 4 mg via ORAL
  Filled 2022-05-16: qty 1

## 2022-05-16 MED ORDER — HYDROMORPHONE HCL 2 MG PO TABS
8.0000 mg | ORAL_TABLET | Freq: Four times a day (QID) | ORAL | Status: DC | PRN
  Administered 2022-05-16 – 2022-05-17 (×3): 8 mg via ORAL
  Filled 2022-05-16 (×3): qty 4

## 2022-05-16 MED ORDER — PERFLUTREN LIPID MICROSPHERE
1.0000 mL | INTRAVENOUS | Status: AC | PRN
  Administered 2022-05-16: 2 mL via INTRAVENOUS

## 2022-05-16 MED ORDER — HEPARIN BOLUS VIA INFUSION
2000.0000 [IU] | Freq: Once | INTRAVENOUS | Status: AC
  Administered 2022-05-16: 2000 [IU] via INTRAVENOUS

## 2022-05-16 NOTE — Progress Notes (Signed)
Rounding Note    Patient Name: Jerry [name] Date of Encounter: 05/16/2022  [place] Cardiologist: Quay Burow, MD   Subjective   Re-consulted by primary team for chest pain. Patient tells me he has had mild, brief intermittent chest pain for a long time, but last night around 8-9PM he felt short of breath. Shortly after that, he felt chest tightness/soreness that lasted about 45 seconds. This subsided on its own. After that he had mild chest soreness for about an hour. The only residual symptom he has now is chest tenderness to touch near the sternum.  Inpatient Medications    Scheduled Meds:  aspirin EC  81 mg Oral BID   atorvastatin  80 mg Oral Daily   dutasteride  0.5 mg Oral QHS   enoxaparin (LOVENOX) injection  40 mg Subcutaneous Q24H   ferrous sulfate  325 mg Oral Daily   fluticasone furoate-vilanterol  1 puff Inhalation Daily   furosemide  20 mg Oral PC supper   furosemide  40 mg Oral Daily   metoprolol tartrate  25 mg Oral BID   nortriptyline  40 mg Oral QHS   pantoprazole  40 mg Oral BID   senna  4 tablet Oral BID   tamsulosin  0.8 mg Oral q AM   traZODone  300 mg Oral QHS   umeclidinium bromide  1 puff Inhalation Daily   Continuous Infusions:  PRN Meds: HYDROmorphone, polyethylene glycol, tiZANidine   Vital Signs    Vitals:   05/16/22 0428 05/16/22 0600 05/16/22 0829 05/16/22 0930  BP: (!) 123/52 128/63  135/64  Pulse: 65 69  78  Resp: '18 15  17  '$ Temp: 97.7 F (36.5 C)  97.6 F (36.4 C)   TempSrc:      SpO2: 100% 99%  (!) 79%  Weight:      Height:        Intake/Output Summary (Last 24 hours) at 05/16/2022 1006 Last data filed at 05/15/2022 1917 Gross per 24 hour  Intake 100 ml  Output --  Net 100 ml      05/15/2022   10:00 AM 08/18/2021   12:24 PM 08/18/2021    6:00 AM  Last 3 Weights  Weight (lbs) 267 lb 286 lb 6.4 oz 279 lb 12.8 oz  Weight (kg) 121.11 kg 129.91 kg 126.916 kg      Telemetry    Sinus rhythm/sinus  tach - Personally Reviewed  ECG    SR  - Personally Reviewed  Physical Exam   GEN: No acute distress.  [place] in place Neck: No JVD Cardiac: RRR, no rubs, or gallops. 2/6 systolic murmur Respiratory: Clear to auscultation bilaterally, though distant breath sounds GI: Soft, nontender, non-distended  MS: No edema; No deformity. Neuro:  Nonfocal  Psych: Normal affect   Labs    High Sensitivity Troponin:   Recent Labs  Lab 05/15/22 0858 05/15/22 1526 05/16/22 0805  TROPONINIHS 13 587* 737*     Chemistry Recent Labs  Lab 05/15/22 0858 05/16/22 0409 05/16/22 0805  NA 134* 140  --   K 3.1* 4.4  --   CL 92* 101  --   CO2 27 30  --   GLUCOSE 175* 102*  --   BUN 9 9  --   CREATININE 1.05 1.00  --   CALCIUM 9.3 9.5  --   MG 1.4*  --  2.4  PROT 6.8  --   --   ALBUMIN 3.6  --   --  AST 34  --   --   ALT 17  --   --   ALKPHOS 60  --   --   BILITOT 0.6  --   --   GFRNONAA >60 >60  --   ANIONGAP 15 9  --     Lipids No results for input(s): "CHOL", "TRIG", "HDL", "LABVLDL", "LDLCALC", "CHOLHDL" in the last 168 hours.  Hematology Recent Labs  Lab 05/15/22 0858 05/16/22 0409  WBC 5.7 11.3*  RBC 3.81* 4.11*  HGB 9.9* 10.6*  HCT 32.3* 35.0*  MCV 84.8 85.2  MCH 26.0 25.8*  MCHC 30.7 30.3  RDW 14.5 14.8  PLT 175 188   Thyroid  Recent Labs  Lab 05/15/22 1609  TSH 0.653    BNP Recent Labs  Lab 05/15/22 0858  BNP 12.2    DDimer  Recent Labs  Lab 05/15/22 0858  DDIMER 0.81*     Radiology    CT Angio Chest/Abd/Pel for Dissection W and/or Wo Contrast  Result Date: 05/15/2022 CLINICAL DATA:  Chest and back pain. EXAM: CT ANGIOGRAPHY CHEST, ABDOMEN AND PELVIS TECHNIQUE: Non-contrast CT of the chest was initially obtained. Multidetector CT imaging through the chest, abdomen and pelvis was performed using the standard protocol during bolus administration of intravenous contrast. Multiplanar reconstructed images and MIPs were obtained and reviewed to evaluate the  vascular anatomy. RADIATION DOSE REDUCTION: This exam was performed according to the departmental dose-optimization program which includes automated exposure control, adjustment of the mA and/or kV according to patient size and/or use of iterative reconstruction technique. CONTRAST:  165m OMNIPAQUE IOHEXOL 350 MG/ML SOLN COMPARISON:  Chest CT 06/09/2020 and CT abdomen 07/14/2017 FINDINGS: CTA CHEST FINDINGS Cardiovascular: The heart is borderline enlarged but stable. No pericardial effusion. The aorta is normal in caliber. No dissection. Scattered atherosclerotic calcifications. Calcifications are noted around the aortic valve and there are three-vessel coronary artery calcifications. Mediastinum/Nodes: No mediastinal or hilar mass or lymphadenopathy. The esophagus is grossly normal. Lungs/Pleura: Chronic lung changes but no worrisome pulmonary lesions or pulmonary nodules. No pneumothorax. Chronic left basilar rounded atelectasis and/or scarring changes. Adjacent pleural calcifications. Musculoskeletal: Stable surgical changes from anterior chest wall thoracoplasty. No acute bony findings. Review of the MIP images confirms the above findings. CTA ABDOMEN AND PELVIS FINDINGS VASCULAR Aorta: Stable scattered atherosclerotic calcifications but no aneurysm dissection. Celiac: Moderate ostial calcifications and mild/moderate stenosis. The branch vessels are patent. SMA: Minimal ostial calcifications.  No stenosis or dissection. Renals: Moderate ostial calcifications and renal vascular calcifications. No aneurysm or dissection. IMA: Patent Inflow: Moderate atherosclerotic calcifications but no aneurysm or dissection. Veins: Unremarkable. Review of the MIP images confirms the above findings. NON-VASCULAR Hepatobiliary: No hepatic lesions or intrahepatic biliary dilatation. The gallbladder is surgically absent. No common bile duct dilatation. Pancreas: Limited evaluation due to artifact from motion and from the patient's  arms being down by his side. No obvious mass or inflammation. Diffuse fatty change. Spleen: No gross abnormality. Adrenals/Urinary Tract: No gross abnormality. The bladder is unremarkable. Stomach/Bowel: Grossly normal.  Limited evaluation. Lymphatic: No mesenteric or retroperitoneal mass or adenopathy. Reproductive: The prostate gland and seminal vesicles are unremarkable. Other: No abdominal or pelvic fluid collections. Musculoskeletal: Total right hip arthroplasty noted. Associated artifact. Suspect right hip joint effusion and synovitis. I do not see any obvious prosthetic loosening. Moderate degenerative changes involving the left hip. Review of the MIP images confirms the above findings. IMPRESSION: 1. No thoracic or abdominal aortic aneurysm or dissection. 2. Stable atherosclerotic calcifications involving the thoracic  and abdominal aorta and branch vessels as above. 3. Chronic lung changes but no worrisome pulmonary lesions or pulmonary nodules. 4. Limited evaluation but no acute abdominal/pelvic findings, mass lesions or adenopathy. 5. 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 6. Aortic atherosclerosis. Aortic Atherosclerosis (ICD10-I70.0). Electronically Signed   By: Marijo Sanes M.D.   On: 05/15/2022 14:21   DG Chest Portable 1 View  Result Date: 05/15/2022 CLINICAL DATA:  Chest pain. EXAM: PORTABLE CHEST 1 VIEW COMPARISON:  Chest x-ray 07/13/2021. FINDINGS: Similar chronic pleuroparenchymal scarring on the right. No new consolidation. No visible pleural effusions or pneumothorax. Cardiomediastinal silhouette is enlarged and accentuated by technique. Polyarticular degenerative change. IMPRESSION: No evidence of acute cardiopulmonary disease. Electronically Signed   By: Margaretha Sheffield M.D.   On: 05/15/2022 09:08    Cardiac Studies   Echo pending at this time  Patient Profile     71 y.o. male a hx of CAD s/p remote stenting to OM1 in 1997 and DES to  mid LAD in 44/0347, chronic diastolic CHF with EF of 42-59% on Echo in 09/2020, NSVT, mild bilateral carotid artery disease, COPD with chronic respiratory failure followed by Dr. Melvyn Novas, hypertension, hyperlipidemia, pre-diabetes, GERD, chronic low back pain, remote tobacco use, and morbid obesity. Initially seen 05/15/22 at the request of Dr. Matilde Sprang for tachycardia, now re-consulted for episode of chest pain  Assessment & Plan    Chest pain -symptoms preceded by shortness of breath. Residual symptom is tenderness to palpation of the chest -hsTn elevated, but in the setting of tachycardia/distress yesterday, this is not as helpful -on prior cath, recommended for medical management -undergoing echo at this time -suspect he may have intermittent demand ischemia with his shortness of breath -Cr, Hgb stable. Reasonable to use IV heparin for now. If echo severely different from prior, will discuss pros/cons of cath -white count up today, d dimer elevated, defer to primary team -continue aspirin, clopidogrel, atorvastatin  For questions or updates, please contact [place] Please consult [email or web address] for contact info under        Signed, Buford Dresser, MD  05/16/2022, 10:06 AM

## 2022-05-16 NOTE — Progress Notes (Signed)
Subjective:  Overnight Events: Patient reports having another chest pain episode around 8 PM last night that felt similar to the chest pain he experienced yesterday morning (sharp and squeezing). The pain radiated to left arm. The episode resolved in about 20-30 seconds, and now he has a constant dull chest pain this AM.  Patient evaluated at bedside this AM. Reports sweating with the chest pain episode last night, no dizziness. Patient reports he has some pain in his shoulder, no more pain radiating to his arm or leg this AM.  Objective:  Vital signs in last 24 hours: Vitals:   05/16/22 0200 05/16/22 0300 05/16/22 0428 05/16/22 0600  BP: 107/63 111/74 (!) 123/52 128/63  Pulse: 80  65 69  Resp: '18 13 18 15  '$ Temp:   97.7 F (36.5 C)   TempSrc:      SpO2: 99% 99% 100% 99%  Weight:      Height:       Weight change:   Intake/Output Summary (Last 24 hours) at 05/16/2022 0820 Last data filed at 05/15/2022 1917 Gross per 24 hour  Intake 100 ml  Output --  Net 100 ml    Physical Exam   Constitutional: Pleasant, chronically ill-appearing man in no acute distress. Some discomfort. On 2.5 L O2 Cardiovascular: regular rate with normal rhythm, systolic murmur in upper sternal border. Tenderness to palpation in sternum area. Radial pulse 2+. Pulmonary/Chest: normal work of breathing on 2.5 L O2, lungs clear to auscultation bilaterally Abdominal: soft, non-tender, non-distended, bowel sounds present. Morphine pump in righ lower quadrant. Paraumbilical hernia present. MSK: no lower extremity edema Skin: warm and dry. Neurological: alert and answering questions appropriately.  Labs: CBC    Latest Ref Rng & Units 05/16/2022    4:09 AM 05/15/2022    8:58 AM 08/22/2021    8:14 AM  CBC  WBC 4.0 - 10.5 K/uL 11.3  5.7    Hemoglobin 13.0 - 17.0 g/dL 10.6  9.9  9.4   Hematocrit 39.0 - 52.0 % 35.0  32.3  30.2   Platelets 150 - 400 K/uL 188  175     CMP       Latest Ref Rng & Units  05/16/2022    4:09 AM 05/15/2022    8:58 AM 08/19/2021    3:28 AM  CMP  Glucose 70 - 99 mg/dL 102  175  161   BUN 8 - 23 mg/dL '9  9  8   '$ Creatinine 0.61 - 1.24 mg/dL 1.00  1.05  0.82   Sodium 135 - 145 mmol/L 140  134  141   Potassium 3.5 - 5.1 mmol/L 4.4  3.1  3.4   Chloride 98 - 111 mmol/L 101  92  99   CO2 22 - 32 mmol/L 30  27  36   Calcium 8.9 - 10.3 mg/dL 9.5  9.3  8.7   Total Protein 6.5 - 8.1 g/dL  6.8    Total Bilirubin 0.3 - 1.2 mg/dL  0.6    Alkaline Phos 38 - 126 U/L  60    AST 15 - 41 U/L  34    ALT 0 - 44 U/L  17     Troponin (0805 10/11): 737  EKG [phone] 05/16/22): personally reviewed my interpretation is normal rate, sinus rhythm  Assessment/Plan:  Principal Problem:   Chest pain Active Problems:   Chronic pain syndrome   CAD (coronary artery disease)   Essential hypertension   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 Jerry [name] is a 71 y.o. male with past medical history of CAD, chronic diastolic HF due to hypertropic obstructive cardiomyopathy, s/p coronary artery stent placement, COPD GOLD II, essential hypertension, chronic pain syndrome, and normocytic anemia who presents with chest pain and tachycardia concerning for acute coronary syndrome who is being admitted for further cardiopulmonary work-up and monitoring.  Chest pain Tachycardia Patient's chest pain episode of sharp and squeezing with radiation and tachycardia that is worse on exertion yesterday morning concerning for ACS. Patient experienced another chest pain episode last night with sharp and squeezing quality that was preceded by shortness of breath. The episode yesterday PM resolved in about 20-30 seconds. He continues to experience a constant dull chest pain with tenderness to palpation in his sternum area. Cardiology  consulted this AM, appreciate recommendations. Thus far, repeat EKG's with no evidence of acute ischemia. Troponin elevation could be in the setting of tachycardia yesterday. Patient's symptoms could be from NSTEMI vs. unstable angina. Will start patient on IV heparin given possible ACS. Will follow-up on echocardiogram, and if there are changes from previous, patient could benefit from repeat cardiac catheterization. -Continue cardiac monitoring -F/u echocardiogram -Pharmacy consulted: Start heparin 4000 units IV bolus x1, then start heparin infusion at 1400 units/hr.  -Check heparin levels every 6 hours and daily -AM BMP, CBC  CAD s/p LAD stent 2019 -Continue on home aspirin 81 mg, clopidogrel 75 mg, metoprolol tartrate 25 mg daily  COPD GOLD II Patient is breathing comfortably on his home 2.5L O2 on exam. Discussed options for patient's maintenance inhalers at discharge with patient - pharmacy team at bedside as well. -Continue on home Budeson-Glycopyrrol-Formoterol 2 puffs daily -Supplemental oxygen as needed with goal of 88-92%  HTN BP stable this AM. -Continue to monitor  Hypokalemia (Resolved) K 4.4 this AM  Hypomagnesia (Resolved) Mg 2.4 this AM  HLD: continue home lipitor  Normocytic anemia: continue home ferrous sulfate  Lumbar postlaminectomy syndrome: on pain pump, dilaudid PRN GERD: continue home pantoprazole  Anxiety: continue home trazodone, nortriptyline BPH: continue home dutasteride, tamsulosin Constipation: continue home senna   Diet: Heart healthy VTE: Lovenox IVF: None Code: Limited - No to CPR, yes to intubation   Prior to Admission Living Arrangement: Home Anticipated Discharge Location: Pending clinical progress Barriers to Discharge: Work-up of tachycardia and chest pain Dispo: Anticipated discharge in approximately 1 day(s).  Dispo: Admit patient to Observation with expected length of stay less than 1 midnights.     LOS: 0 days   Jerry Jerry [name] (Jerry)  Jerry [name], MS3 Pager Number: [phone] 05/16/2022, 12:59 PM
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
## 2022-05-16 NOTE — Discharge Instructions (Addendum)
Discharge Instructions  Jerry [name], you were hospitalized for chest pain with increased heart rate and difficulty breathing that was concerning for a heart attack. Your work-up in the hospital did not show evidence of a heart attack. While you were in the hospital you underwent a heart ultrasound (echocardiogram) that did not show significant change from prior. Thank you for allowing [place] to be part of your care.   We arranged for you to follow up with cardiology.   Please note these changes made to your medications:   Please START taking:  -Voltaren gel as needed for chest pain, you can get this over the counter.  -Your maintenance and as needed inhalers   Please take your other medications as you were before you came to the hospital.   Please make sure to follow up with cardiology and pulmonology. Cardiology has scheduled a follow-up appointment for you with Dr. Gwenlyn Found on 10/30 at 11am.   It was a pleasure being a part of your team, thank you for allowing [place] to be a part of your care.  Take care, Internal Medicine Teaching Service

## 2022-05-16 NOTE — Progress Notes (Signed)
Internal Medicine Attending:   I saw and examined the patient. I reviewed the resident's H&P note and I agree with the resident's findings and plan as documented in the resident's note.  In brief, patient is a 71 year old male with a past medical history of CAD status post PCI with stents, chronic diastolic heart failure, NSVT, COPD with chronic hypoxic respiratory failure, hypertension, hyperlipidemia, prediabetes, chronic lower back pain who presented to the ED with chest pain x1 day.  Patient rates the chest pain was 8 out of 10 at its worst, squeezing/sharp in nature, midsternal, radiating to his left upper extremity and left leg.  Chest pain is worse with exertion better with rest.  He also reported associated palpitations. Of note, patient did run out of his maintenance inhalers 3 to 4 days ago and was using his as needed albuterol more often.  Overnight, patient stated that he had intermittent episodes of recurrent chest pain and at the time of evaluation this morning stated that his pain has eased off but is still present.    In the ED, patient was noted to be tachycardic in the 140s to 160s with EKG concerning for 2:1 a flutter.  He was evaluated by cardiology at that time and was given adenosine which showed no evidence of flutter waves and patient's tachycardia was likely sinus.  Vitals:   05/16/22 1045 05/16/22 1200  BP:  (!) 153/111  Pulse: 66 (!) 125  Resp: 12 (!) 25  Temp:    SpO2: 100% (!) 80%     On exam, patient is laying in bed in no apparent distress.  Cardiovascular exam reveals regular rate and rhythm with normal heart sounds.  Lungs clear to auscultation bilaterally.  Patient's chest is mildly tender to palpation which patient states is similar to his pain but not exactly the same.  Abdomen is soft, nontender, nondistended with normoactive bowel sounds.  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  Extremities were  nontender to palpation.  No lower extremity edema noted.  In brief, patient is a 71 year old male who is admitted to the hospital with chest pain and sinus tachycardia.  Patient's chest pain is concerning for cardiac etiology especially in the setting of elevated troponins up to 700s this morning consistent with a non-ST elevation MI.  Cardiology follow-up and recommendations appreciated.  It is uncertain if this troponin elevation is secondary to a primary cardiac etiology versus demand ischemia from his tachycardia yesterday.  Patient did have a cardiac cath last year which recommended medical management.  We will follow-up 2D echo today and if patient has new changes he may benefit from a repeat cardiac catheterization.  We will continue with IV heparin for now given concern for possible NSTEMI.  We will continue aspirin, Plavix and atorvastatin as well for his history of CAD and prior stents.  Patient was noted to have tachycardia on admission with an elevated D-dimer concerning for possible underlying PE especially in the setting of his chest pain.  However, patient did have a CTA of his chest/abdomen/pelvis with no evidence of PE or dissection. Patient's TSH was within normal limits.  Patient had no evidence of heart failure exacerbation on imaging or physical exam.  I suspect that the etiology of his tachycardia is multifactorial.  Patient had run out of his maintenance inhaler and was using albuterol more frequently and this may have been contributing to his tachycardia.  Patient also  was having significant chest pain which likely contributed to his tachycardia as well.  No further work-up at this time.  We will continue to monitor closely.

## 2022-05-16 NOTE — Telephone Encounter (Signed)
Pharmacy Patient Advocate Encounter  Insurance verification completed.    The patient is insured through [employer] Part D   The patient is currently admitted and ran test claims for the following: Trelegy Ellipta, Breztri.  Copays and coinsurance results were relayed to Inpatient clinical team.

## 2022-05-16 NOTE — TOC Benefit Eligibility Note (Signed)
Patient Teacher, English as a foreign language completed.    The patient is currently admitted and upon discharge could be taking Trelegy Ellipta Inhaler.  The current 30 day co-pay is $45.00.   The patient is currently admitted and upon discharge could be taking Breztri Inhaler.  The current 30 day co-pay is $45.00.   The patient is insured through [employer], [place] Patient Advocate Specialist [place] Patient Advocate Team Direct Number: [phone]  Fax: [phone]

## 2022-05-16 NOTE — Evaluation (Signed)
Occupational Therapy Evaluation Patient Details Name: Jerry [name] MRN: [id number] DOB: 03-Sep-1950 Today's Date: 05/16/2022   History of Present Illness Pt is a 71 y/o male who presented with tachycardia and chest pain. Chest CT negative for PE. Admitted for further work up and possible cardiac cath pending echo results. PMH: CAD, CHF, COPD, HTN, chronic pain syndrome, anemia.   Clinical Impression   PTA, pt lives with family, typically Modified Independent with ADLs, basic IADLs and mobility using quad cane vs RW. Pt presents now with minor deficits in dynamic standing balance and endurance. Overall, pt fairly close to baseline requiring min guard for sidesteps at bedside and Supervision-Min A for LB ADLs. Collaborated w/ pt re: energy conservation, DME use at home and routine. Pt declines need for HHOT follow-up at DC but will follow acutely to ensure safe progression home. Pt denies SOB on baseline 2.5 L O2 and HR stable in the 70s.       Recommendations for follow up therapy are one component of a multi-disciplinary discharge planning process, led by the attending physician.  Recommendations may be updated based on patient status, additional functional criteria and insurance authorization.   Follow Up Recommendations  No OT follow up (pt declines HHOT)    Assistance Recommended at Discharge Intermittent Supervision/Assistance  Patient can return home with the following A little help with bathing/dressing/bathroom;Assistance with cooking/housework    Functional Status Assessment  Patient has had a recent decline in their functional status and demonstrates the ability to make significant improvements in function in a reasonable and predictable amount of time.  Equipment Recommendations  None recommended by OT    Recommendations for Other Services       Precautions / Restrictions Precautions Precautions: Fall;Other (comment) Precaution Comments: monitor HR, wears 2.5 L O2 at  baseline Restrictions Weight Bearing Restrictions: No      Mobility Bed Mobility Overal bed mobility: Modified Independent                  Transfers Overall transfer level: Needs assistance Equipment used: None Transfers: Sit to/from Stand Sit to Stand: Supervision           General transfer comment: able to stand without DME, march in place and take steps beside stretcher w/ limping gait noted.      Balance Overall balance assessment: Needs assistance Sitting-balance support: No upper extremity supported, Feet supported Sitting balance-Leahy Scale: Good     Standing balance support: No upper extremity supported, During functional activity Standing balance-Leahy Scale: Fair                             ADL either performed or assessed with clinical judgement   ADL Overall ADL's : Needs assistance/impaired Eating/Feeding: Independent   Grooming: Supervision/safety;Standing   Upper Body Bathing: Independent   Lower Body Bathing: Min guard;Sitting/lateral leans;Sit to/from stand   Upper Body Dressing : Independent;Sitting   Lower Body Dressing: Minimal assistance;Sit to/from stand   Toilet Transfer: Min guard;Stand-pivot   Toileting- Water quality scientist and Hygiene: Supervision/safety;Sit to/from stand;Sitting/lateral lean Toileting - Clothing Manipulation Details (indicate cue type and reason): used urinal standing beside stretcher without assist       General ADL Comments: Pt fairly close to baseline with reported resolved SOB/chest pain. Baseline balance deficits, reliant on DME use for mobility     Vision Ability to See in Adequate Light: 0 Adequate Patient Visual Report: No change from baseline Vision Assessment?:  No apparent visual deficits     Perception     Praxis      Pertinent Vitals/Pain Pain Assessment Pain Assessment: No/denies pain     Hand Dominance Right   Extremity/Trunk Assessment Upper Extremity  Assessment Upper Extremity Assessment: Overall WFL for tasks assessed   Lower Extremity Assessment Lower Extremity Assessment: Defer to PT evaluation   Cervical / Trunk Assessment Cervical / Trunk Assessment: Normal   Communication Communication Communication: No difficulties   Cognition Arousal/Alertness: Awake/alert Behavior During Therapy: WFL for tasks assessed/performed Overall Cognitive Status: Within Functional Limits for tasks assessed                                       General Comments  HR stable 70s with standing ADLs, O2 with poor pleth throughout though pt denies SOB. Discussed case with nursing staff. per cardiology notes, suspecting demand ischemia for elevated troponins. Cardiologist cleared pt for mobility with therapies    Exercises     Shoulder Instructions      Home Living Family/patient expects to be discharged to:: Private residence Living Arrangements: Children;Other relatives Available Help at Discharge: Family;Available 24 hours/day Type of Home: House Home Access: Level entry     Home Layout: Able to live on main level with bedroom/bathroom;Two level     Bathroom Shower/Tub: Teacher, early years/pre: Standard     Home Equipment: Conservation officer, nature (2 wheels);Cane - single point;BSC/3in1;Hospital bed;Cane - quad;Shower seat - built in;Shower seat   Additional Comments: Pt lives in basement apartment of son/DIL's home; has kitchen, bathroom, etc in basement and does not need to go upstairs in the home.      Prior Functioning/Environment Prior Level of Function : Independent/Modified Independent             Mobility Comments: use of cane or RW. Reports he will have a fall if not usiing DME for mobility, last bad fall was one year ago ADLs Comments: Modified Independent with ADLs, makes meals in basement apartment kitchen        OT Problem List: Decreased activity tolerance;Impaired balance (sitting and/or  standing);Cardiopulmonary status limiting activity      OT Treatment/Interventions: Self-care/ADL training;Therapeutic exercise;Energy conservation;DME and/or AE instruction;Therapeutic activities;Patient/family education;Balance training    OT Goals(Current goals can be found in the care plan section) Acute Rehab OT Goals Patient Stated Goal: find out root cause of SOB/chest pain and high HR OT Goal Formulation: With patient Time For Goal Achievement: 05/30/22 Potential to Achieve Goals: Good  OT Frequency: Min 2X/week    Co-evaluation              AM-PAC OT "6 Clicks" Daily Activity     Outcome Measure Help from another person eating meals?: None Help from another person taking care of personal grooming?: A Little Help from another person toileting, which includes using toliet, bedpan, or urinal?: A Little Help from another person bathing (including washing, rinsing, drying)?: A Little Help from another person to put on and taking off regular upper body clothing?: None Help from another person to put on and taking off regular lower body clothing?: A Little 6 Click Score: 20   End of Session Equipment Utilized During Treatment: Oxygen Nurse Communication: Mobility status  Activity Tolerance: Patient tolerated treatment well Patient left: in bed;with call bell/phone within reach  OT Visit Diagnosis: Other abnormalities of gait and mobility (R26.89);Unsteadiness on  feet (R26.81)                Time: 3419-6222 OT Time Calculation (min): 16 min Charges:  OT General Charges $OT Visit: 1 Visit OT Evaluation $OT Eval Low Complexity: 1 Low  Malachy Chamber, OTR/L Acute Rehab Services Office: [phone]   Layla Maw 05/16/2022, 12:42 PM

## 2022-05-16 NOTE — ED Notes (Signed)
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 Adjacent pleural calcifications. Musculoskeletal: Stable surgical changes from anterior chest wall thoracoplasty. No acute bony findings. Review of the MIP images confirms the above findings. CTA ABDOMEN AND PELVIS FINDINGS VASCULAR Aorta: Stable scattered atherosclerotic calcifications but no aneurysm dissection. Celiac: Moderate ostial calcifications and mild/moderate stenosis. The branch  vessels are patent. SMA: Minimal ostial calcifications.  No stenosis or dissection. Renals: Moderate ostial calcifications and renal vascular calcifications. No aneurysm or dissection. IMA: Patent Inflow: Moderate atherosclerotic calcifications but no aneurysm or dissection. Veins: Unremarkable. Review  of the MIP images confirms the above findings. NON-VASCULAR Hepatobiliary: No hepatic lesions or intrahepatic biliary dilatation. The gallbladder is surgically absent. No common bile duct dilatation. Pancreas: Limited evaluation due to artifact from motion and from the patient's arms being down by his side. No obvious mass or inflammation. Diffuse fatty change. Spleen: No gross abnormality. Adrenals/Urinary Tract: No gross abnormality. The bladder is unremarkable. Stomach/Bowel: Grossly normal.  Limited evaluation. Lymphatic: No mesenteric or retroperitoneal mass or adenopathy. Reproductive: The prostate gland and seminal vesicles are unremarkable. Other: No abdominal or pelvic fluid collections. Musculoskeletal: Total right hip arthroplasty noted. Associated artifact. Suspect right hip joint effusion and synovitis. I do not see any obvious prosthetic loosening. Moderate degenerative changes involving the left hip. Review of the MIP images confirms the above findings. IMPRESSION: 1. No thoracic or abdominal aortic aneurysm or dissection. 2. Stable atherosclerotic calcifications involving the thoracic and abdominal aorta and branch vessels as above. 3. Chronic lung changes but no worrisome pulmonary lesions or pulmonary nodules. 4. Limited evaluation but no acute abdominal/pelvic findings, mass lesions or adenopathy. 5. 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 6. Aortic atherosclerosis. Aortic Atherosclerosis (ICD10-I70.0). Electronically Signed   By: Marijo Sanes M.D.   On: 05/15/2022 14:21   DG Chest Portable 1 View  Result Date: 05/15/2022 CLINICAL DATA:  Chest pain. EXAM: PORTABLE CHEST 1 VIEW COMPARISON:  Chest x-ray 07/13/2021. FINDINGS: Similar chronic pleuroparenchymal scarring on the right. No new consolidation. No visible pleural effusions or pneumothorax. Cardiomediastinal silhouette is enlarged and accentuated by technique. Polyarticular degenerative  change. IMPRESSION: No evidence of acute cardiopulmonary disease. Electronically Signed   By: Margaretha Sheffield M.D.   On: 05/15/2022 09:08    Pending Labs Unresulted Labs (From admission, onward)     Start     Ordered   05/17/22 0500  CBC  Tomorrow morning,   R        05/16/22 1325   05/17/22 1157  Basic metabolic panel  Tomorrow morning,   R        05/16/22 1325   05/17/22 0500  Magnesium  Tomorrow morning,   R        05/16/22 1345   05/16/22 1900  Heparin level (unfractionated)  Once-Timed,   TIMED        05/16/22 1318            Vitals/Pain Today's Vitals   05/16/22 1600 05/16/22 1610 05/16/22 1613 05/16/22 1704  BP: (!) 111/52     Pulse: (!) 57 60    Resp: 12 12    Temp:   97.8 F (36.6 C)   TempSrc:   Oral   SpO2: 100% 100%    Weight:      Height:      PainSc:    4     Isolation Precautions No active isolations  Medications Medications  potassium chloride 10 mEq in 100 mL IVPB (10 mEq Intravenous Not Given 05/15/22 1624)  aspirin EC tablet 81 mg (81 mg Oral Given 05/16/22 0734)  HYDROmorphone (DILAUDID) tablet 8 mg (8 mg Oral Given 05/16/22 1623)  atorvastatin (LIPITOR) tablet 80 mg (80 mg Oral Given 05/16/22 1010)  furosemide (LASIX) tablet 40 mg (40 mg Oral Given 05/16/22 1010)  furosemide (  LASIX) tablet 20 mg (20 mg Oral Given 05/15/22 1803)  metoprolol tartrate (LOPRESSOR) tablet 25 mg (25 mg Oral Given 05/16/22 1010)  nortriptyline (PAMELOR) capsule 40 mg (40 mg Oral Given 05/15/22 2201)  traZODone (DESYREL) tablet 300 mg (300 mg Oral Given 05/15/22 2215)  pantoprazole (PROTONIX) EC tablet 40 mg (40 mg Oral Given 05/16/22 1010)  senna (SENOKOT) tablet 34.4 mg (34.4 mg Oral Not Given 05/16/22 1024)  polyethylene glycol (MIRALAX / GLYCOLAX) packet 17 g (has no administration in time range)  dutasteride (AVODART) capsule 0.5 mg (0.5 mg Oral Given 05/15/22 2202)  tamsulosin (FLOMAX) capsule 0.8 mg (0.8 mg Oral Given 05/16/22 0733)  ferrous sulfate tablet  325 mg (325 mg Oral Given 05/16/22 1010)  fluticasone furoate-vilanterol (BREO ELLIPTA) 200-25 MCG/ACT 1 puff (1 puff Inhalation Not Given 05/16/22 0738)  umeclidinium bromide (INCRUSE ELLIPTA) 62.5 MCG/ACT 1 puff (1 puff Inhalation Not Given 05/16/22 0738)  heparin ADULT infusion 100 units/mL (25000 units/227m) (1,400 Units/hr Intravenous New Bag/Given 05/16/22 1239)  perflutren lipid microspheres (DEFINITY) IV suspension (2 mLs Intravenous Given 05/16/22 1157)  tiZANidine (ZANAFLEX) tablet 4 mg (has no administration in time range)  clopidogrel (PLAVIX) tablet 75 mg (75 mg Oral Given 05/16/22 1415)  diltiazem (CARDIZEM) injection 10 mg (10 mg Intravenous Given 05/15/22 0901)  ondansetron (ZOFRAN) injection 4 mg (4 mg Intravenous Given 05/15/22 0944)  fentaNYL (SUBLIMAZE) injection 50 mcg (50 mcg Intravenous Given 05/15/22 1049)  adenosine (ADENOCARD) 6 MG/2ML injection 6 mg (6 mg Intravenous Given 05/15/22 1112)  adenosine (ADENOCARD) 6 MG/2ML injection 12 mg (12 mg Intravenous Given 05/15/22 1117)  ondansetron (ZOFRAN) injection 4 mg (4 mg Intravenous Given 05/15/22 1311)  famotidine (PEPCID) IVPB 20 mg premix (0 mg Intravenous Stopped 05/15/22 1504)  iohexol (OMNIPAQUE) 350 MG/ML injection 100 mL (100 mLs Intravenous Contrast Given 05/15/22 1357)  clopidogrel (PLAVIX) tablet 75 mg (75 mg Oral Given 05/15/22 1801)  potassium chloride SA (KLOR-CON M) CR tablet 40 mEq (40 mEq Oral Given 05/15/22 2202)  magnesium sulfate IVPB 2 g 50 mL (0 g Intravenous Stopped 05/15/22 1917)  heparin bolus via infusion 4,000 Units (4,000 Units Intravenous Bolus from Bag 05/16/22 1239)    Mobility walks with device Moderate fall risk   Focused Assessments Cardiac Assessment Handoff:  Cardiac Rhythm: Sinus bradycardia Lab Results  Component Value Date   CKTOTAL 57 09/01/2009   CKMB 1.2 09/01/2009   TROPONINI <0.03 07/16/2018   Lab Results  Component Value Date   DDIMER 0.81 (H) 05/15/2022   Does  the Patient currently have chest pain? No   , Pulmonary Assessment Handoff:  Lung sounds: L Breath Sounds: Clear R Breath Sounds: Clear O2 Device: Nasal Cannula O2 Flow Rate (L/min): 2.5 L/min    R Recommendations: See Admitting Provider Note  Report given to:   Additional Notes:

## 2022-05-17 ENCOUNTER — Other Ambulatory Visit (HOSPITAL_COMMUNITY): Payer: Self-pay

## 2022-05-17 DIAGNOSIS — G894 Chronic pain syndrome: Secondary | ICD-10-CM | POA: Diagnosis not present

## 2022-05-17 DIAGNOSIS — I214 Non-ST elevation (NSTEMI) myocardial infarction: Secondary | ICD-10-CM | POA: Diagnosis not present

## 2022-05-17 DIAGNOSIS — R000 Tachycardia, unspecified: Secondary | ICD-10-CM | POA: Diagnosis not present

## 2022-05-17 DIAGNOSIS — R079 Chest pain, unspecified: Secondary | ICD-10-CM | POA: Diagnosis not present

## 2022-05-17 DIAGNOSIS — E876 Hypokalemia: Secondary | ICD-10-CM | POA: Diagnosis not present

## 2022-05-17 LAB — CBC
HCT: 29.3 % — ABNORMAL LOW (ref 39.0–52.0)
Hemoglobin: 8.8 g/dL — ABNORMAL LOW (ref 13.0–17.0)
MCH: 25.7 pg — ABNORMAL LOW (ref 26.0–34.0)
MCHC: 30 g/dL (ref 30.0–36.0)
MCV: 85.4 fL (ref 80.0–100.0)
Platelets: 201 10*3/uL (ref 150–400)
RBC: 3.43 MIL/uL — ABNORMAL LOW (ref 4.22–5.81)
RDW: 14.7 % (ref 11.5–15.5)
WBC: 6.3 10*3/uL (ref 4.0–10.5)
nRBC: 0 % (ref 0.0–0.2)

## 2022-05-17 LAB — MAGNESIUM: Magnesium: 1.9 mg/dL (ref 1.7–2.4)

## 2022-05-17 LAB — HEPARIN LEVEL (UNFRACTIONATED): Heparin Unfractionated: 0.2 IU/mL — ABNORMAL LOW (ref 0.30–0.70)

## 2022-05-17 LAB — BASIC METABOLIC PANEL
Anion gap: 10 (ref 5–15)
BUN: 10 mg/dL (ref 8–23)
CO2: 32 mmol/L (ref 22–32)
Calcium: 9 mg/dL (ref 8.9–10.3)
Chloride: 96 mmol/L — ABNORMAL LOW (ref 98–111)
Creatinine, Ser: 1.04 mg/dL (ref 0.61–1.24)
GFR, Estimated: >60 mL/min (ref >60)
Glucose, Bld: 105 mg/dL — ABNORMAL HIGH (ref 70–99)
Potassium: 4 mmol/L (ref 3.5–5.1)
Sodium: 138 mmol/L (ref 135–145)

## 2022-05-17 MED ORDER — BISACODYL 10 MG RE SUPP
10.0000 mg | Freq: Once | RECTAL | Status: AC
  Administered 2022-05-17: 10 mg via RECTAL
  Filled 2022-05-17: qty 1

## 2022-05-17 MED ORDER — DICLOFENAC SODIUM 1 % EX GEL
2.0000 g | Freq: Four times a day (QID) | CUTANEOUS | Status: DC
  Administered 2022-05-17: 2 g via TOPICAL
  Filled 2022-05-17: qty 100

## 2022-05-17 MED ORDER — ALUM & MAG HYDROXIDE-SIMETH 200-200-20 MG/5ML PO SUSP
15.0000 mL | ORAL | Status: DC | PRN
  Administered 2022-05-17: 15 mL via ORAL
  Filled 2022-05-17: qty 30

## 2022-05-17 MED ORDER — BREZTRI AEROSPHERE 160-9-4.8 MCG/ACT IN AERO: 2.0000 | INHALATION_SPRAY | Freq: Two times a day (BID) | RESPIRATORY_TRACT | 1 refills | Status: DC

## 2022-05-17 MED ORDER — ALBUTEROL SULFATE HFA 108 (90 BASE) MCG/ACT IN AERS
INHALATION_SPRAY | RESPIRATORY_TRACT | 11 refills | Status: DC
  Filled 2022-05-17: qty 18, 17d supply, fill #0

## 2022-05-17 MED ORDER — BREZTRI AEROSPHERE 160-9-4.8 MCG/ACT IN AERO
2.0000 | INHALATION_SPRAY | Freq: Two times a day (BID) | RESPIRATORY_TRACT | 1 refills | Status: DC
  Filled 2022-05-17: qty 10.7, 30d supply, fill #0

## 2022-05-17 NOTE — Evaluation (Signed)
Physical Therapy Evaluation Patient Details Name: Jerry [name] MRN: [id number] DOB: Mar 05, 1951 Today's Date: 05/17/2022  History of Present Illness  Pt is a 71 y/o male who presented with tachycardia and chest pain. Chest CT negative for PE. Admitted for further work up and possible cardiac cath pending echo results. PMH: CAD, CHF, COPD, HTN, chronic pain syndrome, anemia.  Clinical Impression  Pt presents to PT with no significant deficits compared to baseline. Pt is able to transfer and ambulate for household distances without loss of balance or DOE. Pt is slightly limited by reports of back pain, however this is typical for his baseline. PT encourages frequent ambulation daily in an effort to maintain the patients endurance. Pt has no further acute PT needs. Mobility specialist team will follow.       Recommendations for follow up therapy are one component of a multi-disciplinary discharge planning process, led by the attending physician.  Recommendations may be updated based on patient status, additional functional criteria and insurance authorization.  Follow Up Recommendations No PT follow up      Assistance Recommended at Discharge PRN  Patient can return home with the following  Assistance with cooking/housework;Assist for transportation;Help with stairs or ramp for entrance    Equipment Recommendations None recommended by PT  Recommendations for Other Services       Functional Status Assessment Patient has not had a recent decline in their functional status     Precautions / Restrictions Precautions Precautions: Fall;Other (comment) Precaution Comments: monitor HR, 2.5 L baseline at rest, 3 with mobility Restrictions Weight Bearing Restrictions: No      Mobility  Bed Mobility Overal bed mobility: Modified Independent             General bed mobility comments: HOB elevated, has hospital bed at home    Transfers Overall transfer level: Modified  independent Equipment used: Rolling walker (2 wheels) Transfers: Sit to/from Stand Sit to Stand: Modified independent (Device/Increase time)                Ambulation/Gait Ambulation/Gait assistance: Modified independent (Device/Increase time) Gait Distance (Feet): 200 Feet (2 brief standing rest breaks during ambulation) Assistive device: Rolling walker (2 wheels) Gait Pattern/deviations: Step-through pattern Gait velocity: reduced Gait velocity interpretation: 1.31 - 2.62 ft/sec, indicative of limited community ambulator   General Gait Details: slowed step-through gait, increased trunk flexion  Stairs            Wheelchair Mobility    Modified Rankin (Stroke Patients Only)       Balance Overall balance assessment: Needs assistance Sitting-balance support: No upper extremity supported, Feet supported Sitting balance-Leahy Scale: Good     Standing balance support: Single extremity supported, Reliant on assistive device for balance Standing balance-Leahy Scale: Poor                               Pertinent Vitals/Pain Pain Assessment Pain Assessment: 0-10 Pain Score: 4  Pain Location: chest and back Pain Descriptors / Indicators: Cramping Pain Intervention(s): Monitored during session    Home Living Family/patient expects to be discharged to:: Private residence Living Arrangements: Children;Other relatives Available Help at Discharge: Family;Available 24 hours/day Type of Home: House Home Access: Level entry       Home Layout: Able to live on main level with bedroom/bathroom;Two level Home Equipment: Conservation officer, nature (2 wheels);Cane - single point;BSC/3in1;Hospital bed;Cane - quad;Shower seat - built in;Shower seat Additional Comments: Pt lives  in basement apartment of son/DIL's home; has kitchen, bathroom, etc in basement and does not need to go upstairs in the home.    Prior Function Prior Level of Function : Independent/Modified  Independent             Mobility Comments: use of cane or RW. Reports he will have a fall if not usiing DME for mobility, last bad fall was one year ago       Hand Dominance   Dominant Hand: Right    Extremity/Trunk Assessment   Upper Extremity Assessment Upper Extremity Assessment: Overall WFL for tasks assessed    Lower Extremity Assessment Lower Extremity Assessment: Overall WFL for tasks assessed    Cervical / Trunk Assessment Cervical / Trunk Assessment: Normal  Communication   Communication: No difficulties  Cognition Arousal/Alertness: Awake/alert Behavior During Therapy: WFL for tasks assessed/performed Overall Cognitive Status: Within Functional Limits for tasks assessed                                          General Comments General comments (skin integrity, edema, etc.): HR from 70-88 during session, sats stable when mobilizing on 3L [place].    Exercises     Assessment/Plan    PT Assessment Patient does not need any further PT services  PT Problem List         PT Treatment Interventions      PT Goals (Current goals can be found in the Care Plan section)       Frequency       Co-evaluation               AM-PAC PT "6 Clicks" Mobility  Outcome Measure Help needed turning from your back to your side while in a flat bed without using bedrails?: None Help needed moving from lying on your back to sitting on the side of a flat bed without using bedrails?: None Help needed moving to and from a bed to a chair (including a wheelchair)?: None Help needed standing up from a chair using your arms (e.g., wheelchair or bedside chair)?: None Help needed to walk in hospital room?: None Help needed climbing 3-5 steps with a railing? : A Little 6 Click Score: 23    End of Session Equipment Utilized During Treatment: Oxygen Activity Tolerance: Patient tolerated treatment well Patient left: in bed;with call bell/phone within  reach Nurse Communication: Mobility status PT Visit Diagnosis: Other abnormalities of gait and mobility (R26.89)    Time: 0272-5366 PT Time Calculation (min) (ACUTE ONLY): 17 min   Charges:   PT Evaluation $PT Eval Low Complexity: Bruce, PT, DPT Acute Rehabilitation Office [phone]   Zenaida Niece 05/17/2022, 9:58 AM

## 2022-05-17 NOTE — TOC Transition Note (Signed)
Transition of Care [facility]) - CM/SW Discharge Note   Patient Details  Name: Jerry [name] MRN: [id number] Date of Birth: 10-30-1950  [facility] CM/SW Contact:  Zenon Mayo, RN Phone Number: 05/17/2022, 1:58 PM   Clinical Narrative:    Patient is for dc today, has no needs.         Patient Goals and CMS Choice        Discharge Placement                       Discharge Plan and Services                                     Social Determinants of Health (SDOH) Interventions     Readmission Risk Interventions    09/30/2020    2:33 PM  Readmission Risk Prevention Plan  Transportation Screening Complete  PCP or Specialist Appt within 3-5 Days Complete  HRI or [place] Complete  Social Work Consult for [place] Planning/Counseling Complete  Palliative Care Screening Not Applicable  Medication Review Press photographer) Complete

## 2022-05-17 NOTE — Progress Notes (Signed)
Discharge instructions reviewed with pt.  Copy of instructions given to pt. [facility] filled 2 scripts and was delivered to pt. Pt d/c'd via wheelchair with belongings,with O2 on, son at main entrance.             Escorted by staff.

## 2022-05-17 NOTE — Progress Notes (Signed)
Rounding Note    Patient Name: Jerry [name] Date of Encounter: 05/17/2022  [place] Cardiologist: Quay Burow, MD   Subjective   Continues to have intermittent sharp, brief chest pain and his chronic 2/10 all over chest pain. Breathing much improved.  Inpatient Medications    Scheduled Meds:  aspirin EC  81 mg Oral BID   atorvastatin  80 mg Oral Daily   clopidogrel  75 mg Oral Daily   diclofenac Sodium  2 g Topical QID   dutasteride  0.5 mg Oral QHS   ferrous sulfate  325 mg Oral Daily   fluticasone furoate-vilanterol  1 puff Inhalation Daily   furosemide  20 mg Oral PC supper   furosemide  40 mg Oral Daily   metoprolol tartrate  25 mg Oral BID   nortriptyline  40 mg Oral QHS   pantoprazole  40 mg Oral BID   senna  4 tablet Oral BID   tamsulosin  0.8 mg Oral q AM   traZODone  300 mg Oral QHS   umeclidinium bromide  1 puff Inhalation Daily   Continuous Infusions:   PRN Meds: alum & mag hydroxide-simeth, HYDROmorphone, polyethylene glycol, tiZANidine   Vital Signs    Vitals:   05/16/22 2052 05/16/22 2053 05/17/22 0552 05/17/22 1138  BP:  125/74 (!) 120/59 (!) 148/101  Pulse:    76  Resp: (!) '22  20 15  '$ Temp: 98 F (36.7 C)  98.2 F (36.8 C) 97.7 F (36.5 C)  TempSrc: Oral  Oral Oral  SpO2: 100%   95%  Weight:   122.7 kg   Height:        Intake/Output Summary (Last 24 hours) at 05/17/2022 1325 Last data filed at 05/17/2022 8315 Gross per 24 hour  Intake 668.51 ml  Output 1200 ml  Net -531.49 ml      05/17/2022    5:52 AM 05/16/2022    8:47 PM 05/15/2022   10:00 AM  Last 3 Weights  Weight (lbs) 270 lb 6.4 oz 270 lb 11.2 oz 267 lb  Weight (kg) 122.653 kg 122.789 kg 121.11 kg      Telemetry    Sinus rhythm- Personally Reviewed  ECG    SR  - Personally Reviewed  Physical Exam   GEN: Well nourished, well developed in no acute distress NECK: No JVD CARDIAC: regular rhythm, normal S1 and S2, no rubs or gallops. 2/6  systolic murmur. VASCULAR: Radial pulses 2+ bilaterally.  RESPIRATORY:  Clear to auscultation without rales, wheezing or rhonchi but distant ABDOMEN: Soft, non-tender, non-distended MUSCULOSKELETAL:  Moves all 4 limbs independently SKIN: Warm and dry, no edema NEUROLOGIC:  No focal neuro deficits noted. PSYCHIATRIC:  Normal affect    Labs    High Sensitivity Troponin:   Recent Labs  Lab 05/15/22 0858 05/15/22 1526 05/16/22 0805 05/16/22 1236  TROPONINIHS 13 587* 737* 662*     Chemistry Recent Labs  Lab 05/15/22 0858 05/16/22 0409 05/16/22 0805 05/17/22 0605  NA 134* 140  --  138  K 3.1* 4.4  --  4.0  CL 92* 101  --  96*  CO2 27 30  --  32  GLUCOSE 175* 102*  --  105*  BUN 9 9  --  10  CREATININE 1.05 1.00  --  1.04  CALCIUM 9.3 9.5  --  9.0  MG 1.4*  --  2.4 1.9  PROT 6.8  --   --   --   ALBUMIN  3.6  --   --   --   AST 34  --   --   --   ALT 17  --   --   --   ALKPHOS 60  --   --   --   BILITOT 0.6  --   --   --   GFRNONAA >60 >60  --  >60  ANIONGAP 15 9  --  10    Lipids No results for input(s): "CHOL", "TRIG", "HDL", "LABVLDL", "LDLCALC", "CHOLHDL" in the last 168 hours.  Hematology Recent Labs  Lab 05/15/22 0858 05/16/22 0409 05/17/22 0605  WBC 5.7 11.3* 6.3  RBC 3.81* 4.11* 3.43*  HGB 9.9* 10.6* 8.8*  HCT 32.3* 35.0* 29.3*  MCV 84.8 85.2 85.4  MCH 26.0 25.8* 25.7*  MCHC 30.7 30.3 30.0  RDW 14.5 14.8 14.7  PLT 175 188 201   Thyroid  Recent Labs  Lab 05/15/22 1609  TSH 0.653    BNP Recent Labs  Lab 05/15/22 0858  BNP 12.2    DDimer  Recent Labs  Lab 05/15/22 0858  DDIMER 0.81*     Radiology    ECHOCARDIOGRAM COMPLETE  Result Date: 05/16/2022    ECHOCARDIOGRAM REPORT   Patient Name:   Jerry [name] Date of Exam: 05/16/2022 Medical Rec #:  [id number]        Height:       74.0 in Accession #:    [id number]       Weight:       267.0 lb Date of Birth:  30-Nov-1950       BSA:          2.457 m Patient Age:    71 years         BP:            128/63 mmHg Patient Gender: M                HR:           72 bpm. Exam Location:  Inpatient Procedure: 2D Echo, Cardiac Doppler, Color Doppler and Intracardiac            Opacification Agent Indications:    Abnormal ECG R94.31  History:        Patient has prior history of Echocardiogram examinations, most                 recent 09/27/2020. CHF, CAD, COPD, Arrythmias:Tachycardia,                 Signs/Symptoms:Dyspnea and Chest Pain; Risk                 Factors:Hypertension, Current Smoker, Dyslipidemia and Diabetes.  Sonographer:    Ronny Flurry Referring Phys: [id number] NISCHAL NARENDRA IMPRESSIONS  1. Left ventricular ejection fraction, by estimation, is 50%. The left ventricle has mildly decreased function. The left ventricle demonstrates global hypokinesis. There is mild concentric left ventricular hypertrophy. Left ventricular diastolic parameters are consistent with Grade I diastolic dysfunction (impaired relaxation).  2. Right ventricular systolic function is normal. The right ventricular size is normal.  3. The mitral valve is normal in structure. No evidence of mitral valve regurgitation. No evidence of mitral stenosis.  4. The aortic valve is tricuspid. There is moderate calcification of the aortic valve. Aortic valve regurgitation is trivial. Aortic valve mean gradient measures 8.0 mmHg.  5. Aortic dilatation noted. There is mild dilatation of the aortic root, measuring 39 mm.  6. The inferior vena  cava is normal in size with greater than 50% respiratory variability, suggesting right atrial pressure of 3 mmHg.  7. Technically difficult study with very poor images. Unable to comment on individual wall segments in the LV, overall appears mildly hypokinetic. FINDINGS  Left Ventricle: Left ventricular ejection fraction, by estimation, is 50%. The left ventricle has mildly decreased function. The left ventricle demonstrates global hypokinesis. Definity contrast agent was given IV to delineate the left  ventricular endocardial borders. The left ventricular internal cavity size was normal in size. There is mild concentric left ventricular hypertrophy. Left ventricular diastolic parameters are consistent with Grade I diastolic dysfunction (impaired relaxation). Right Ventricle: The right ventricular size is normal. No increase in right ventricular wall thickness. Right ventricular systolic function is normal. Left Atrium: Left atrial size was normal in size. Right Atrium: Right atrial size was normal in size. Pericardium: There is no evidence of pericardial effusion. Mitral Valve: The mitral valve is normal in structure. No evidence of mitral valve regurgitation. No evidence of mitral valve stenosis. Tricuspid Valve: The tricuspid valve is normal in structure. Tricuspid valve regurgitation is trivial. Aortic Valve: The aortic valve is tricuspid. There is moderate calcification of the aortic valve. Aortic valve regurgitation is trivial. Aortic valve mean gradient measures 8.0 mmHg. Aortic valve peak gradient measures 12.1 mmHg. Aortic valve area, by VTI measures 3.17 cm. Pulmonic Valve: The pulmonic valve was normal in structure. Pulmonic valve regurgitation is trivial. Aorta: Aortic dilatation noted. There is mild dilatation of the aortic root, measuring 39 mm. Venous: The inferior vena cava is normal in size with greater than 50% respiratory variability, suggesting right atrial pressure of 3 mmHg. IAS/Shunts: No atrial level shunt detected by color flow Doppler.  LEFT VENTRICLE PLAX 2D LVIDd:         5.70 cm   Diastology LVIDs:         4.10 cm   LV e' medial:    6.09 cm/s LV PW:         1.40 cm   LV E/e' medial:  13.5 LV IVS:        1.50 cm   LV e' lateral:   7.72 cm/s LVOT diam:     2.30 cm   LV E/e' lateral: 10.7 LV SV:         125 LV SV Index:   51 LVOT Area:     4.15 cm  RIGHT VENTRICLE RV S prime:     13.60 cm/s TAPSE (M-mode): 2.7 cm LEFT ATRIUM             Index        RIGHT ATRIUM           Index LA diam:         4.20 cm 1.71 cm/m   RA Area:     29.50 cm LA Vol (A2C):   46.8 ml 19.04 ml/m  RA Volume:   116.00 ml 47.20 ml/m LA Vol (A4C):   28.2 ml 11.48 ml/m LA Biplane Vol: 32.3 ml 13.14 ml/m  AORTIC VALVE AV Area (Vmax):    3.01 cm AV Area (Vmean):   3.00 cm AV Area (VTI):     3.17 cm AV Vmax:           174.00 cm/s AV Vmean:          130.500 cm/s AV VTI:            0.395 m AV Peak Grad:      12.1  mmHg AV Mean Grad:      8.0 mmHg LVOT Vmax:         126.00 cm/s LVOT Vmean:        94.300 cm/s LVOT VTI:          0.302 m LVOT/AV VTI ratio: 0.76  AORTA Ao Root diam: 3.90 cm Ao Asc diam:  3.60 cm MITRAL VALVE MV Area (PHT): 4.12 cm    SHUNTS MV Decel Time: 184 msec    Systemic VTI:  0.30 m MV E velocity: 82.50 cm/s  Systemic Diam: 2.30 cm MV A velocity: 90.20 cm/s MV E/A ratio:  0.91 Dalton McleanMD Electronically signed by Franki Monte Signature Date/Time: 05/16/2022/2:22:13 PM    Final    CT Angio Chest/Abd/Pel for Dissection W and/or Wo Contrast  Result Date: 05/15/2022 CLINICAL DATA:  Chest and back pain. EXAM: CT ANGIOGRAPHY CHEST, ABDOMEN AND PELVIS TECHNIQUE: Non-contrast CT of the chest was initially obtained. Multidetector CT imaging through the chest, abdomen and pelvis was performed using the standard protocol during bolus administration of intravenous contrast. Multiplanar reconstructed images and MIPs were obtained and reviewed to evaluate the vascular anatomy. RADIATION DOSE REDUCTION: This exam was performed according to the departmental dose-optimization program which includes automated exposure control, adjustment of the mA and/or kV according to patient size and/or use of iterative reconstruction technique. CONTRAST:  123m OMNIPAQUE IOHEXOL 350 MG/ML SOLN COMPARISON:  Chest CT 06/09/2020 and CT abdomen 07/14/2017 FINDINGS: CTA CHEST FINDINGS Cardiovascular: The heart is borderline enlarged but stable. No pericardial effusion. The aorta is normal in caliber. No dissection. Scattered  atherosclerotic calcifications. Calcifications are noted around the aortic valve and there are three-vessel coronary artery calcifications. Mediastinum/Nodes: No mediastinal or hilar mass or lymphadenopathy. The esophagus is grossly normal. Lungs/Pleura: Chronic lung changes but no worrisome pulmonary lesions or pulmonary nodules. No pneumothorax. Chronic left basilar rounded atelectasis and/or scarring changes. Adjacent pleural calcifications. Musculoskeletal: Stable surgical changes from anterior chest wall thoracoplasty. No acute bony findings. Review of the MIP images confirms the above findings. CTA ABDOMEN AND PELVIS FINDINGS VASCULAR Aorta: Stable scattered atherosclerotic calcifications but no aneurysm dissection. Celiac: Moderate ostial calcifications and mild/moderate stenosis. The branch vessels are patent. SMA: Minimal ostial calcifications.  No stenosis or dissection. Renals: Moderate ostial calcifications and renal vascular calcifications. No aneurysm or dissection. IMA: Patent Inflow: Moderate atherosclerotic calcifications but no aneurysm or dissection. Veins: Unremarkable. Review of the MIP images confirms the above findings. NON-VASCULAR Hepatobiliary: No hepatic lesions or intrahepatic biliary dilatation. The gallbladder is surgically absent. No common bile duct dilatation. Pancreas: Limited evaluation due to artifact from motion and from the patient's arms being down by his side. No obvious mass or inflammation. Diffuse fatty change. Spleen: No gross abnormality. Adrenals/Urinary Tract: No gross abnormality. The bladder is unremarkable. Stomach/Bowel: Grossly normal.  Limited evaluation. Lymphatic: No mesenteric or retroperitoneal mass or adenopathy. Reproductive: The prostate gland and seminal vesicles are unremarkable. Other: No abdominal or pelvic fluid collections. Musculoskeletal: Total right hip arthroplasty noted. Associated artifact. Suspect right hip joint effusion and synovitis. I do  not see any obvious prosthetic loosening. Moderate degenerative changes involving the left hip. Review of the MIP images confirms the above findings. IMPRESSION: 1. No thoracic or abdominal aortic aneurysm or dissection. 2. Stable atherosclerotic calcifications involving the thoracic and abdominal aorta and branch vessels as above. 3. Chronic lung changes but no worrisome pulmonary lesions or pulmonary nodules. 4. Limited evaluation but no acute abdominal/pelvic findings, mass lesions or adenopathy. 5. Total right hip  arthroplasty with suspected right hip joint effusion and synovitis. 6. Aortic atherosclerosis. Aortic Atherosclerosis (ICD10-I70.0). Electronically Signed   By: Marijo Sanes M.D.   On: 05/15/2022 14:21    Cardiac Studies   Echo 05/16/22 1. Left ventricular ejection fraction, by estimation, is 50%. The left  ventricle has mildly decreased function. The left ventricle demonstrates  global hypokinesis. There is mild concentric left ventricular hypertrophy.  Left ventricular diastolic  parameters are consistent with Grade I diastolic dysfunction (impaired  relaxation).   2. Right ventricular systolic function is normal. The right ventricular  size is normal.   3. The mitral valve is normal in structure. No evidence of mitral valve  regurgitation. No evidence of mitral stenosis.   4. The aortic valve is tricuspid. There is moderate calcification of the  aortic valve. Aortic valve regurgitation is trivial. Aortic valve mean  gradient measures 8.0 mmHg.   5. Aortic dilatation noted. There is mild dilatation of the aortic root,  measuring 39 mm.   6. The inferior vena cava is normal in size with greater than 50%  respiratory variability, suggesting right atrial pressure of 3 mmHg.   7. Technically difficult study with very poor images. Unable to comment  on individual wall segments in the LV, overall appears mildly hypokinetic.   Patient Profile     71 y.o. male a hx of CAD s/p  remote stenting to OM1 in 1997 and DES to mid LAD in 16/3846, chronic diastolic CHF with EF of 65-99% on Echo in 09/2020, NSVT, mild bilateral carotid artery disease, COPD with chronic respiratory failure followed by Dr. Melvyn Novas, hypertension, hyperlipidemia, pre-diabetes, GERD, chronic low back pain, remote tobacco use, and morbid obesity. Initially seen 05/15/22 at the request of Dr. Matilde Sprang for tachycardia, now re-consulted for episode of chest pain  Assessment & Plan    Chest pain -symptoms preceded by shortness of breath. Residual symptom is tenderness to palpation of the chest -hsTn elevated, but in the setting of tachycardia/distress yesterday, this is not as helpful -on prior cath, recommended for medical management as stent patent and no other obstructive disease -Echo largely similar to prior, difficult images, EF ~50% (was 55-60% prior) -suspect he may have intermittent demand ischemia with his shortness of breath -Hgb down today without evidence of clear blood loss. Stop heparin -continue aspirin, clopidogrel, atorvastatin  [facility] will sign off.   Medication Recommendations:  continue home cardiac meds Other recommendations (labs, testing, etc):  none Follow up as an outpatient:  We will arrange for outpatient cardiology follow up with Dr. Gwenlyn Found or a member of his team   For questions or updates, please contact [place] Please consult [email or web address] for contact info under        Signed, Buford Dresser, MD  05/17/2022, 1:25 PM

## 2022-05-17 NOTE — Progress Notes (Signed)
Occupational Therapy Treatment/Discharge Patient Details Name: Jerry [name] MRN: [id number] DOB: [birth year] Today's Date: 05/17/2022   History of present illness Pt is a 71 y/o male who presented with tachycardia and chest pain. Chest CT negative for PE. Admitted for further work up and possible cardiac cath pending echo results. PMH: CAD, CHF, COPD, HTN, chronic pain syndrome, anemia.   OT comments  Pt with improving endurance and able to complete bathroom mobility and toileting tasks Independently today. Pt with good self monitoring of symptoms, when rest breaks needed and denies any concerns regarding daily activities at this time. OT to sign off at acute level. Pt would benefit from mobility specialist's service while admitted to further improve mobility endurance.    Recommendations for follow up therapy are one component of a multi-disciplinary discharge planning process, led by the attending physician.  Recommendations may be updated based on patient status, additional functional criteria and insurance authorization.    Follow Up Recommendations  No OT follow up    Assistance Recommended at Discharge PRN  Patient can return home with the following      Equipment Recommendations  None recommended by OT    Recommendations for Other Services      Precautions / Restrictions Precautions Precautions: Fall;Other (comment) Precaution Comments: monitor HR, 2.5 L baseline at rest, 3 with mobility Restrictions Weight Bearing Restrictions: No       Mobility Bed Mobility Overal bed mobility: Modified Independent                  Transfers Overall transfer level: Modified independent Equipment used: None Transfers: Sit to/from Stand Sit to Stand: Modified independent (Device/Increase time)                 Balance Overall balance assessment: Needs assistance Sitting-balance support: No upper extremity supported, Feet supported Sitting balance-Leahy Scale:  Good     Standing balance support: Single extremity supported, Reliant on assistive device for balance Standing balance-Leahy Scale: Fair                             ADL either performed or assessed with clinical judgement   ADL Overall ADL's : Modified independent                         Toilet Transfer: Modified Independent;Ambulation;Regular Toilet   Toileting- Clothing Manipulation and Hygiene: Modified independent;Sit to/from stand;Sitting/lateral lean         General ADL Comments: Able to mobilize well in room to bathroom without assistance, declined need for AD for this short distance. Per RN, pt has been completing ADLs fairly independently in room, appears to be feeling better than initial presentation    Extremity/Trunk Assessment Upper Extremity Assessment Upper Extremity Assessment: Overall WFL for tasks assessed   Lower Extremity Assessment Lower Extremity Assessment: Defer to PT evaluation   Cervical / Trunk Assessment Cervical / Trunk Assessment: Normal    Vision   Vision Assessment?: No apparent visual deficits   Perception     Praxis      Cognition Arousal/Alertness: Awake/alert Behavior During Therapy: WFL for tasks assessed/performed Overall Cognitive Status: Within Functional Limits for tasks assessed                                          Exercises  Shoulder Instructions       General Comments HR 70-80s. Pt opted to remove O2 to walk to bathroom    Pertinent Vitals/ Pain       Pain Assessment Pain Assessment: Faces Faces Pain Scale: Hurts a little bit Pain Location: back Pain Descriptors / Indicators: Sore Pain Intervention(s): Monitored during session  Home Living Family/patient expects to be discharged to:: Private residence Living Arrangements: Children;Other relatives Available Help at Discharge: Family;Available 24 hours/day Type of Home: House Home Access: Level entry     Home  Layout: Able to live on main level with bedroom/bathroom;Two level     Bathroom Shower/Tub: Teacher, early years/pre: Standard Bathroom Accessibility: No   Home Equipment: Conservation officer, nature (2 wheels);Cane - single point;BSC/3in1;Hospital bed;Cane - quad;Shower seat - built in;Shower seat   Additional Comments: Pt lives in basement apartment of son/DIL's home; has kitchen, bathroom, etc in basement and does not need to go upstairs in the home.      Prior Functioning/Environment              Frequency  Min 2X/week        Progress Toward Goals  OT Goals(current goals can now be found in the care plan section)  Progress towards OT goals: Goals met/education completed, patient discharged from OT  Acute Rehab OT Goals Patient Stated Goal: home soon OT Goal Formulation: With patient Time For Goal Achievement: 05/30/22 Potential to Achieve Goals: Good ADL Goals Pt Will Perform Lower Body Dressing: with modified independence;sit to/from stand Pt Will Transfer to Toilet: with modified independence;ambulating Pt Will Perform Tub/Shower Transfer: Tub transfer;with supervision;ambulating;shower seat  Plan Discharge plan remains appropriate;All goals met and education completed, patient discharged from OT services    Co-evaluation                 AM-PAC OT "6 Clicks" Daily Activity     Outcome Measure   Help from another person eating meals?: None Help from another person taking care of personal grooming?: None Help from another person toileting, which includes using toliet, bedpan, or urinal?: None Help from another person bathing (including washing, rinsing, drying)?: A Little Help from another person to put on and taking off regular upper body clothing?: None Help from another person to put on and taking off regular lower body clothing?: None 6 Click Score: 23    End of Session Equipment Utilized During Treatment: Oxygen  OT Visit Diagnosis: Other  abnormalities of gait and mobility (R26.89);Unsteadiness on feet (R26.81)   Activity Tolerance Patient tolerated treatment well   Patient Left Other (comment) (in bathroom - RN/NT aware)   Nurse Communication Mobility status        Time: 5681-2751 OT Time Calculation (min): 11 min  Charges: OT General Charges $OT Visit: 1 Visit OT Treatments $Self Care/Home Management : 8-22 mins  Malachy Chamber, OTR/L Acute Rehab Services Office: [phone]   Layla Maw 05/17/2022, 11:21 AM

## 2022-05-17 NOTE — Discharge Summary (Addendum)
Name: Jerry [name] MRN: [id number] DOB: [birth year] 71 y.o. PCP: Aletha Halim., PA-C  Date of Admission: 05/15/2022  8:48 AM Date of Discharge: 05/17/22 Attending Physician: Dr. 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 ([place])   Status post coronary artery stent placement   COPD GOLD II   Normocytic anemia   Sinus tachycardia    Discharge Medications: Allergies as of 05/17/2022       Reactions   Ciprofloxacin Anaphylaxis   Penicillins Rash, Other (See Comments)   UNSPECIFIED SEVERITY - childhood reaction Has patient had a PCN reaction causing immediate rash, facial/tongue/throat swelling, SOB or lightheadedness with hypotension:unsure Has patient had a PCN reaction causing severe rash involving mucus membranes or skin necrosis:unsure Has patient had a PCN reaction that required hospitalization:unsure Has patient had a PCN reaction occurring within the last 10 years: NO Tolerated ceftriaxone (2018) and cefazolin (2023) without issues   Tramadol Rash        Medication List     TAKE these medications    acetaminophen 650 MG CR tablet Commonly known as: TYLENOL Take 650 mg by mouth every 8 (eight) hours as needed for pain.   albuterol 108 (90 Base) MCG/ACT inhaler Commonly known as: ProAir HFA Inhale 2 puffs every 4 hours as needed only  if your can't catch your breath What changed: additional instructions   ascorbic acid 500 MG tablet Commonly known as: VITAMIN C Take 500 mg by mouth daily.   aspirin EC 81 MG tablet Take 1 tablet (81 mg total) by mouth 2 (two) times daily.   atorvastatin 80 MG tablet Commonly known as: LIPITOR TAKE 1 TABLET BY MOUTH EVERY DAY AT 6PM What changed: See the new instructions.   bisacodyl 10 MG  suppository Commonly known as: DULCOLAX Place 10 mg rectally daily as needed for moderate constipation.   Breztri Aerosphere 160-9-4.8 MCG/ACT Aero Generic drug: Budeson-Glycopyrrol-Formoterol Inhale 2 puffs into the lungs in the morning and at bedtime.   calcium carbonate 500 MG chewable tablet Commonly known as: TUMS - dosed in mg elemental calcium Chew 1-2 tablets by mouth 3 (three) times daily as needed for indigestion or heartburn.   cholecalciferol 1000 units tablet Commonly known as: VITAMIN D Take 1,000 Units by mouth daily.   clopidogrel 75 MG tablet Commonly known as: PLAVIX TAKE 1 TABLET BY MOUTH DAILY. PLEASE SCHEDULE AN OFFICE VISIT WITH DR. Gwenlyn Found What changed: See the new instructions.   dutasteride 0.5 MG capsule Commonly known as: AVODART Take 0.5 mg by mouth at bedtime.   ferrous sulfate 325 (65 FE) MG tablet Take 325 mg by mouth daily.   furosemide 20 MG tablet Commonly known as: LASIX Take 2 tablets (40 mg total) by mouth daily. What changed:  how much to take when to take this additional instructions   HYDROmorphone 8 MG tablet Commonly known as: DILAUDID Take 1 tablet (8 mg total) by mouth every 6 (six) hours as needed for severe pain.   losartan 25 MG tablet Commonly known as: COZAAR TAKE 1 TABLET BY MOUTH EVERY DAY   metoprolol tartrate 25 MG tablet Commonly known as: LOPRESSOR TAKE 1 TABLET BY MOUTH 2 TIMES DAILY. SCHEDULE AN APPOINTMENT FOR FURTHER REFILLS, FINAL ATTEMPT What changed: See the new instructions.   multivitamin with minerals Tabs tablet Take 1  tablet by mouth daily. FOR SENIORS   nitroGLYCERIN 0.4 MG SL tablet Commonly known as: NITROSTAT DISSOLVE 1 TABLET UNDER TONGUE EVERY 5 MINS AS NEEDED FOR CHEST PAIN What changed: See the new instructions.   nortriptyline 10 MG capsule Commonly known as: PAMELOR Take 40 mg by mouth at bedtime.   ondansetron 4 MG tablet Commonly known as: ZOFRAN Take 4 mg by mouth every 8  (eight) hours as needed for nausea or vomiting.   PAIN MANAGEMENT INTRATHECAL (IT) PUMP 1 each by Intrathecal route. Intrathecal (IT) medication:  Morphine   pantoprazole 40 MG tablet Commonly known as: PROTONIX Take 1 tablet (40 mg total) by mouth 2 (two) times daily.   polyethylene glycol 17 g packet Commonly known as: MIRALAX / GLYCOLAX Take 17 g by mouth daily as needed for moderate constipation.   potassium chloride 10 MEQ tablet Commonly known as: KLOR-CON M Take 1 tablet (10 mEq total) by mouth every other day.   senna 8.6 MG Tabs tablet Commonly known as: SENOKOT Take 4 tablets by mouth 2 (two) times daily.   sertraline 100 MG tablet Commonly known as: ZOLOFT Take 50-100 mg by mouth See admin instructions. Take 100 mg (1 tab) by mouth in the morning, and 50 mg (1/2 tab) in the evening.   SUMAtriptan 100 MG tablet Commonly known as: IMITREX Take 100 mg by mouth every 2 (two) hours as needed for migraine. May repeat in 2 hours if headache persists or recurs.   tamsulosin 0.4 MG Caps capsule Commonly known as: FLOMAX Take 0.4-0.8 mg by mouth See admin instructions. Take 0.8 mg (2 capsules) by mouth every morning, and 0.4 mg (1 capsule) every evening.   tiZANidine 4 MG tablet Commonly known as: ZANAFLEX Take 4 mg by mouth 3 (three) times daily.   traZODone 150 MG tablet Commonly known as: DESYREL Take 300 mg by mouth at bedtime.   VISINE OP Place 1 drop into both eyes 4 (four) times daily as needed (dry eyes).        Disposition and follow-up:   Jerry [name] was discharged from [facility] in Stable condition.  At the hospital follow up visit please address:  1.  Follow-up:  a. Adherence to inhalers   b. His ongoing chest pain   Follow-up Appointments:  Follow-up Information     Lorretta Harp, MD Follow up on 06/04/2022.   Specialties: Cardiology, Radiology Why: '@11'$ :00AM. Cardiology follow up Contact information: [address] [place] [address] [phone]                -Follow up with your PCP  -Close follow up with cardiology, they will schedule -Follow up with pulmonology  Hospital Course by problem list: Jerry [name] is a 71 y.o. male with past medical history of CAD s/p LAD stent 1829, chronic diastolic HF due to hypertropic obstructive cardiomyopathy, COPD GOLD II, essential hypertension, chronic pain syndrome, and normocytic anemia who presents with chest pain and tachycardia concerning for acute coronary syndrome who was admitted for further cardiopulmonary work-up and monitoring. Below is his hospital course by problem.  Chest Pain Tachycardia Patient presented with a HR in the 140s-160s with chest pain described as sharp and squeezing with radiation since this morning concerning for acute coronary syndrome. There was concern for Aflutter 2:1 as initial EKG showed rate of 150 with concerns for aflutter 2:1. In the ED patient was given bolus of diltiazem followed by drip. Cardiology was  also consulted and patient was given IV adenosine 12 mg. Repeat EKG without evidence of flutter waves and was consistent with sinus tachycardia. Patient's description of chest pain is concerning for potential ACS, however, EKG with no evidence of acute ischemic changes, and there was less concern of the troponin rise in the setting of tachycardia/distress. There was D-dimer elevation on presentation, CTA was negative for pulmonary embolism, aortic dissection, or aneurysm. Patient's symptoms seemed to be consistent with NSTEMI vs. unstable angina and there was increased concern given his significant cardiac history. Patient started on IV heparin on 10/11 given suspicion for ACS which was discontinued prior to discharge. Echocardiogram on 10/11 showed LVEF 50%, mildly decreased function, LV with grade 1 diastolic dysfunction. His chest pain was thought to be multifactorial suspect related to  costochondritis and increased work of breathing in the setting of missing his inhaler doses as well as intermittent demand ischemia with his SOB. He was given voltaren gel prior to discharge.   CAD s/p LAD stent 2019 He has long history of CAD, remote stenting to OM1 in 1997 and underwent cardiac cath 07/2018 that showed instent restonosis of OM1 and mid LAD disease. DES was placed in LAD at that time. Patient continued on home aspirin 81 mg, clopidogrel 75 mg, metoprolol tartrate 25 mg daily during hospitalization   Chronic diastolic HF Last echo 1/77 EF 55-60%. Patient euvolemic during hospitalization. He was continued on his home Lasix 40 mg oral daily and home Lasix 20 mg oral after supper   COPD GOLD II He was started on scheduled incruse ellipta and breo ellipta daily and supplemental oxygen as needed with goal of 88-92%. He was discharged with additional doses of his inhalers.    Chronic conditions:  HTN: Stable during hospitalization HLD: continued on home lipitor  Normocytic anemia: continued on home ferrous sulfate  Lumbar postlaminectomy syndrome: on pain pump, dilaudid PRN GERD: continued home pantoprazole  Anxiety: continued on home trazodone, nortriptyline BPH: continued on home dutasteride, tamsulosin Constipation: continued on home senna     Subjective: Patient reports continuation of his chest pain from yesterday, unrelated to food intake. Denies any new symptoms. Discussed results of echocardiogram, which were normal, and plan for discharge later today. Follow-up with cardiology and pulmonology teams as outpatient is important. He was started on diclofenac gel and given mylanta.   Discharge Vitals:   BP (!) 148/101 (BP Location: Right Arm)   Pulse 76   Temp 97.7 F (36.5 C) (Oral)   Resp 15   Ht '6\' 2"'$  (1.88 m)   Wt 270 lb 6.4 oz (122.7 kg)   SpO2 95%   BMI 34.72 kg/m   Discharge exam: Physical Exam: General: alert Caucasian male sitting on side of bed in no  acute distress on O2 CV: RRR. No rubs or gallops.  Pulm: Normal WOB on O2. Distant breath sounds.  Abdomen: Soft, non-distended, non-tender MSK: No edema   Pertinent Labs, Studies, and Procedures:     Latest Ref Rng & Units 05/17/2022    6:05 AM 05/16/2022    4:09 AM 05/15/2022    8:58 AM  CBC  WBC 4.0 - 10.5 K/uL 6.3  11.3  5.7   Hemoglobin 13.0 - 17.0 g/dL 8.8  10.6  9.9   Hematocrit 39.0 - 52.0 % 29.3  35.0  32.3   Platelets 150 - 400 K/uL 201  188  175        Latest Ref Rng & Units 05/17/2022  6:05 AM 05/16/2022    4:09 AM 05/15/2022    8:58 AM  CMP  Glucose 70 - 99 mg/dL 105  102  175   BUN 8 - 23 mg/dL '10  9  9   '$ Creatinine 0.61 - 1.24 mg/dL 1.04  1.00  1.05   Sodium 135 - 145 mmol/L 138  140  134   Potassium 3.5 - 5.1 mmol/L 4.0  4.4  3.1   Chloride 98 - 111 mmol/L 96  101  92   CO2 22 - 32 mmol/L 32  30  27   Calcium 8.9 - 10.3 mg/dL 9.0  9.5  9.3   Total Protein 6.5 - 8.1 g/dL   6.8   Total Bilirubin 0.3 - 1.2 mg/dL   0.6   Alkaline Phos 38 - 126 U/L   60   AST 15 - 41 U/L   34   ALT 0 - 44 U/L   17     ECHOCARDIOGRAM COMPLETE  Result Date: 05/16/2022  ECHOCARDIOGRAM REPORT   Patient Name:   Jerry [name] IMPRESSIONS  1. Left ventricular ejection fraction, by estimation, is 50%. The left ventricle has mildly decreased function. The left ventricle demonstrates global hypokinesis. There is mild concentric left ventricular hypertrophy. Left ventricular diastolic parameters are consistent with Grade I diastolic dysfunction (impaired relaxation).  2. Right ventricular systolic function is normal. The right ventricular size is normal.  3. The mitral valve is normal in structure. No evidence of mitral valve regurgitation. No evidence of mitral stenosis.  4. The aortic valve is tricuspid. There is moderate calcification of the aortic valve. Aortic valve regurgitation is trivial. Aortic valve mean gradient measures 8.0 mmHg.  5. Aortic dilatation noted. There is mild  dilatation of the aortic root, measuring 39 mm.  6. The inferior vena cava is normal in size with greater than 50% respiratory variability, suggesting right atrial pressure of 3 mmHg.  7. Technically difficult study with very poor images. Unable to comment on individual wall segments in the LV, overall appears mildly hypokinetic.   CT Angio Chest/Abd/Pel for Dissection W and/or Wo Contrast Result Date: 05/15/2022 CLINICAL DATA:  Chest and back pain. EXAM: CT ANGIOGRAPHY CHEST, ABDOMEN AND PELVIS TECHNIQUE: FINDINGS: CTA CHEST FINDINGS  IMPRESSION: 1. No thoracic or abdominal aortic aneurysm or dissection. 2. Stable atherosclerotic calcifications involving the thoracic and abdominal aorta and branch vessels as above. 3. Chronic lung changes but no worrisome pulmonary lesions or pulmonary nodules. 4. Limited evaluation but no acute abdominal/pelvic findings, mass lesions or adenopathy. 5. 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 6. Aortic atherosclerosis. Aortic Atherosclerosis (ICD10-I70.0). Electronically Signed   By: Marijo Sanes M.D.   On: 05/15/2022 14:21   DG Chest Portable 1 View Result Date: 05/15/2022 CLINICAL DATA:  Chest pain. EXAM: PORTABLE CHEST 1 VIEW COMPARISON:  Chest x-ray 07/13/2021.  IMPRESSION: No evidence of acute cardiopulmonary disease. Electronically Signed   By: Margaretha Sheffield M.D.   On: 05/15/2022 09:08     Discharge Instructions: Discharge Instructions     Call MD for:  difficulty breathing, headache or visual disturbances   Complete by: As directed    Call MD for:  extreme fatigue   Complete by: As directed    Call MD for:  hives   Complete by: As directed    Call MD for:  persistant dizziness or light-headedness   Complete by: As directed    Call MD for:  persistant nausea  and vomiting   Complete by: As directed    Call MD for:  redness, tenderness, or signs of infection (pain, swelling, redness, odor or green/yellow  discharge around incision site)   Complete by: As directed    Call MD for:  severe uncontrolled pain   Complete by: As directed    Call MD for:  temperature >100.4   Complete by: As directed    Diet - low sodium heart healthy   Complete by: As directed    Increase activity slowly   Complete by: As directed         Discharge Instructions      Discharge Instructions  Jerry [name], you were hospitalized for chest pain with increased heart rate and difficulty breathing that was concerning for a heart attack. Your work-up in the hospital did not show evidence of a heart attack. While you were in the hospital you underwent a heart ultrasound (echocardiogram) that did not show significant change from prior. Thank you for allowing [place] to be part of your care.   We arranged for you to follow up with cardiology.   Please note these changes made to your medications:   Please START taking:  -Voltaren gel as needed for chest pain, you can get this over the counter.  -Your maintenance and as needed inhalers   Please take your other medications as you were before you came to the hospital.   Please make sure to follow up with cardiology and pulmonology. Cardiology has scheduled a follow-up appointment for you with Dr. Gwenlyn Found on 10/30 at 11am.   It was a pleasure being a part of your team, thank you for allowing [place] to be a part of your care.  Take care, Internal Medicine Teaching Service    Signed: Rolanda Lundborg, MD PGY-1 05/17/2022, 2:02 PM   Pager: [phone]

## 2022-06-04 ENCOUNTER — Ambulatory Visit: Payer: Medicare HMO | Attending: Cardiovascular Disease | Admitting: Cardiovascular Disease

## 2022-07-03 ENCOUNTER — Other Ambulatory Visit: Payer: Self-pay | Admitting: Cardiovascular Disease

## 2022-07-26 ENCOUNTER — Other Ambulatory Visit: Payer: Self-pay

## 2022-07-26 MED ORDER — ATORVASTATIN CALCIUM 80 MG PO TABS: 80.0000 mg | ORAL_TABLET | Freq: Every day | ORAL | 1 refills | Status: DC

## 2022-08-29 ENCOUNTER — Other Ambulatory Visit: Payer: Self-pay | Admitting: Cardiovascular Disease

## 2022-09-21 ENCOUNTER — Other Ambulatory Visit: Payer: Self-pay | Admitting: Cardiovascular Disease

## 2022-10-20 ENCOUNTER — Other Ambulatory Visit: Payer: Self-pay | Admitting: Cardiovascular Disease

## 2023-01-07 ENCOUNTER — Other Ambulatory Visit: Payer: Self-pay | Admitting: Cardiovascular Disease

## 2023-01-15 ENCOUNTER — Other Ambulatory Visit: Payer: Self-pay | Admitting: Cardiovascular Disease

## 2023-01-15 NOTE — Telephone Encounter (Signed)
RX request of Plavix, pt 3rd attempt to schedule f/u appt. Please advise on refill. Thank you!

## 2023-03-25 ENCOUNTER — Telehealth: Payer: Self-pay | Admitting: *Deleted

## 2023-03-25 NOTE — Telephone Encounter (Signed)
Pt is scheduled for in-office visit on 08/27 at 1:55 with Joni Reining, NP. I will send notes to her.

## 2023-03-25 NOTE — Telephone Encounter (Signed)
    Primary Cardiologist:Jonathan Allyson Sabal, MD  Chart reviewed as part of pre-operative protocol coverage. Because of Jerry [name] past medical history and time since last visit, he/she will require a follow-up visit in order to better assess preoperative cardiovascular risk.  Pre-op covering staff: - Please schedule Office appointment and call patient to inform them. - Please contact requesting surgeon's office via preferred method (i.e, phone, fax) to inform them of need for appointment prior to surgery.  If applicable, this message will also be routed to pharmacy pool and/or primary cardiologist for input on holding anticoagulant/antiplatelet agent as requested below so that this information is available at time of patient's appointment.   Ronney Asters, NP  03/25/2023, 12:37 PM

## 2023-03-25 NOTE — Telephone Encounter (Signed)
   Pre-operative Risk Assessment    Patient Name: Jerry [name]  DOB: 07/02/1951 MRN: [id number]      Request for Surgical Clearance    Procedure:   Sacral ES1  Date of Surgery:  Clearance TBD                                 Surgeon:  Dr.E Ailene Ards Group or Practice Name:  [facility] Phone number:  [phone] Fax number:  [phone]   Type of Clearance Requested:   - Medical  - Pharmacy:  Hold Aspirin and Clopidogrel (Plavix) Hold ASA 6 days and Plavix 7 days.    Type of Anesthesia:  Not Indicated   Additional requests/questions:    Signed, Emmit Pomfret   03/25/2023, 12:29 PM
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
## 2023-04-02 ENCOUNTER — Ambulatory Visit: Payer: Medicare Other | Admitting: Adult Health

## 2023-06-11 ENCOUNTER — Telehealth: Payer: Self-pay | Admitting: Cardiovascular Disease

## 2023-06-11 ENCOUNTER — Telehealth: Payer: Self-pay | Admitting: Internal Medicine

## 2023-06-11 NOTE — Telephone Encounter (Signed)
Spoke with patient and he states he is returning phone call.  I do not see any notes or encounters that we have called patient. He has not been seen in our office in over a year. Phone disconnected. Tried to call back phone was busy x3

## 2023-06-11 NOTE — Telephone Encounter (Signed)
This pt states he has received 4 calls over the last couple days from the office. He states he hasn't had any tests or labs that would result in a call, advised him of no notes in his chart advising of a call. He would like a c/b.

## 2023-06-11 NOTE — Telephone Encounter (Signed)
Patient would like refill of albuterol. He has an appointment made for later this month.   Pharmacy [facility] [address] [employer]

## 2023-06-20 MED ORDER — ALBUTEROL SULFATE HFA 108 (90 BASE) MCG/ACT IN AERS: INHALATION_SPRAY | RESPIRATORY_TRACT | 0 refills | Status: DC

## 2023-06-20 NOTE — Telephone Encounter (Signed)
Courtesy refill sent for Albuterol.
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
## 2023-07-01 ENCOUNTER — Ambulatory Visit: Payer: Medicare HMO | Admitting: Internal Medicine

## 2023-07-01 ENCOUNTER — Encounter: Payer: Self-pay | Admitting: Internal Medicine

## 2023-07-12 ENCOUNTER — Other Ambulatory Visit: Payer: Self-pay | Admitting: Internal Medicine

## 2023-08-19 ENCOUNTER — Other Ambulatory Visit: Payer: Self-pay | Admitting: Internal Medicine

## 2023-11-05 DEATH — deceased
